# Patient Record
Sex: Female | Born: 1940 | Race: White | Hispanic: No | Marital: Married | Smoking: Never smoker
Health system: Southern US, Community
[De-identification: ages and names within clinical notes are randomized; demographics above are authoritative.]

## PROBLEM LIST (undated history)

## (undated) DIAGNOSIS — I2510 Atherosclerotic heart disease of native coronary artery without angina pectoris: Secondary | ICD-10-CM

## (undated) DIAGNOSIS — IMO0002 Reserved for concepts with insufficient information to code with codable children: Secondary | ICD-10-CM

## (undated) DIAGNOSIS — K648 Other hemorrhoids: Secondary | ICD-10-CM

## (undated) DIAGNOSIS — D649 Anemia, unspecified: Secondary | ICD-10-CM

## (undated) DIAGNOSIS — K219 Gastro-esophageal reflux disease without esophagitis: Secondary | ICD-10-CM

## (undated) DIAGNOSIS — K602 Anal fissure, unspecified: Secondary | ICD-10-CM

## (undated) DIAGNOSIS — Z8711 Personal history of peptic ulcer disease: Secondary | ICD-10-CM

## (undated) DIAGNOSIS — I4891 Unspecified atrial fibrillation: Secondary | ICD-10-CM

## (undated) DIAGNOSIS — G4730 Sleep apnea, unspecified: Secondary | ICD-10-CM

## (undated) DIAGNOSIS — E079 Disorder of thyroid, unspecified: Secondary | ICD-10-CM

## (undated) DIAGNOSIS — M549 Dorsalgia, unspecified: Secondary | ICD-10-CM

## (undated) DIAGNOSIS — F32A Depression, unspecified: Secondary | ICD-10-CM

## (undated) DIAGNOSIS — M359 Systemic involvement of connective tissue, unspecified: Secondary | ICD-10-CM

## (undated) DIAGNOSIS — K209 Esophagitis, unspecified without bleeding: Secondary | ICD-10-CM

## (undated) DIAGNOSIS — E039 Hypothyroidism, unspecified: Secondary | ICD-10-CM

## (undated) DIAGNOSIS — E785 Hyperlipidemia, unspecified: Secondary | ICD-10-CM

## (undated) DIAGNOSIS — H269 Unspecified cataract: Secondary | ICD-10-CM

## (undated) DIAGNOSIS — I499 Cardiac arrhythmia, unspecified: Secondary | ICD-10-CM

## (undated) DIAGNOSIS — K449 Diaphragmatic hernia without obstruction or gangrene: Secondary | ICD-10-CM

## (undated) DIAGNOSIS — I10 Essential (primary) hypertension: Secondary | ICD-10-CM

## (undated) DIAGNOSIS — K5790 Diverticulosis of intestine, part unspecified, without perforation or abscess without bleeding: Secondary | ICD-10-CM

## (undated) DIAGNOSIS — M8580 Other specified disorders of bone density and structure, unspecified site: Secondary | ICD-10-CM

## (undated) DIAGNOSIS — I214 Non-ST elevation (NSTEMI) myocardial infarction: Secondary | ICD-10-CM

## (undated) DIAGNOSIS — F329 Major depressive disorder, single episode, unspecified: Secondary | ICD-10-CM

## (undated) HISTORY — PX: CATARACT EXTRACTION: SUR2

## (undated) HISTORY — DX: Other specified disorders of bone density and structure, unspecified site: M85.80

## (undated) HISTORY — DX: Major depressive disorder, single episode, unspecified: F32.9

## (undated) HISTORY — DX: Atherosclerotic heart disease of native coronary artery without angina pectoris: I25.10

## (undated) HISTORY — PX: PATELLA FRACTURE SURGERY: SHX735

## (undated) HISTORY — PX: CORONARY ANGIOPLASTY WITH STENT PLACEMENT: SHX49

## (undated) HISTORY — DX: Essential (primary) hypertension: I10

## (undated) HISTORY — DX: Diaphragmatic hernia without obstruction or gangrene: K44.9

## (undated) HISTORY — PX: KNEE SURGERY: SHX244

## (undated) HISTORY — DX: Diverticulosis of intestine, part unspecified, without perforation or abscess without bleeding: K57.90

## (undated) HISTORY — DX: Depression, unspecified: F32.A

## (undated) HISTORY — DX: Anemia, unspecified: D64.9

## (undated) HISTORY — DX: Personal history of peptic ulcer disease: Z87.11

## (undated) HISTORY — DX: Unspecified atrial fibrillation: I48.91

## (undated) HISTORY — DX: Disorder of thyroid, unspecified: E07.9

## (undated) HISTORY — DX: Anal fissure, unspecified: K60.2

## (undated) HISTORY — DX: Hyperlipidemia, unspecified: E78.5

## (undated) HISTORY — DX: Unspecified cataract: H26.9

## (undated) HISTORY — DX: Dorsalgia, unspecified: M54.9

## (undated) HISTORY — PX: ABDOMINAL HYSTERECTOMY: SHX81

## (undated) HISTORY — PX: BREAST REDUCTION SURGERY: SHX8

## (undated) HISTORY — DX: Reserved for concepts with insufficient information to code with codable children: IMO0002

## (undated) HISTORY — DX: Sleep apnea, unspecified: G47.30

## (undated) HISTORY — PX: REDUCTION MAMMAPLASTY: SUR839

## (undated) HISTORY — DX: Gastro-esophageal reflux disease without esophagitis: K21.9

## (undated) HISTORY — DX: Esophagitis, unspecified without bleeding: K20.90

## (undated) HISTORY — PX: CHOLECYSTECTOMY: SHX55

## (undated) HISTORY — PX: APPENDECTOMY: SHX54

## (undated) HISTORY — DX: Esophagitis, unspecified: K20.9

## (undated) HISTORY — DX: Other hemorrhoids: K64.8

---

## 2000-09-21 ENCOUNTER — Inpatient Hospital Stay (HOSPITAL_COMMUNITY): Admission: EM | Admit: 2000-09-21 | Discharge: 2000-09-24 | Payer: Self-pay | Admitting: Cardiovascular Disease

## 2000-10-23 ENCOUNTER — Ambulatory Visit (HOSPITAL_COMMUNITY): Admission: RE | Admit: 2000-10-23 | Discharge: 2000-10-23 | Payer: Self-pay | Admitting: Internal Medicine

## 2000-10-23 ENCOUNTER — Encounter: Payer: Self-pay | Admitting: Internal Medicine

## 2000-11-02 ENCOUNTER — Inpatient Hospital Stay (HOSPITAL_COMMUNITY): Admission: EM | Admit: 2000-11-02 | Discharge: 2000-11-05 | Payer: Self-pay | Admitting: Cardiovascular Disease

## 2000-11-03 ENCOUNTER — Encounter: Payer: Self-pay | Admitting: Internal Medicine

## 2001-01-19 ENCOUNTER — Inpatient Hospital Stay (HOSPITAL_COMMUNITY): Admission: EM | Admit: 2001-01-19 | Discharge: 2001-01-21 | Payer: Self-pay | Admitting: Cardiology

## 2001-04-19 ENCOUNTER — Inpatient Hospital Stay (HOSPITAL_COMMUNITY): Admission: EM | Admit: 2001-04-19 | Discharge: 2001-04-22 | Payer: Self-pay

## 2001-04-20 ENCOUNTER — Encounter: Payer: Self-pay | Admitting: Internal Medicine

## 2002-08-06 ENCOUNTER — Encounter: Payer: Self-pay | Admitting: Unknown Physician Specialty

## 2002-08-06 ENCOUNTER — Ambulatory Visit (HOSPITAL_COMMUNITY): Admission: RE | Admit: 2002-08-06 | Discharge: 2002-08-06 | Payer: Self-pay | Admitting: Unknown Physician Specialty

## 2002-08-12 ENCOUNTER — Ambulatory Visit (HOSPITAL_BASED_OUTPATIENT_CLINIC_OR_DEPARTMENT_OTHER): Admission: RE | Admit: 2002-08-12 | Discharge: 2002-08-12 | Payer: Self-pay | Admitting: Unknown Physician Specialty

## 2002-08-17 ENCOUNTER — Encounter (HOSPITAL_COMMUNITY): Admission: RE | Admit: 2002-08-17 | Discharge: 2002-09-16 | Payer: Self-pay | Admitting: Unknown Physician Specialty

## 2002-08-17 ENCOUNTER — Encounter: Payer: Self-pay | Admitting: Unknown Physician Specialty

## 2002-08-21 ENCOUNTER — Emergency Department (HOSPITAL_COMMUNITY): Admission: EM | Admit: 2002-08-21 | Discharge: 2002-08-21 | Payer: Self-pay | Admitting: Emergency Medicine

## 2002-09-28 ENCOUNTER — Ambulatory Visit (HOSPITAL_COMMUNITY): Admission: RE | Admit: 2002-09-28 | Discharge: 2002-09-28 | Payer: Self-pay | Admitting: Internal Medicine

## 2002-10-28 ENCOUNTER — Observation Stay (HOSPITAL_COMMUNITY): Admission: RE | Admit: 2002-10-28 | Discharge: 2002-10-29 | Payer: Self-pay | Admitting: Internal Medicine

## 2002-11-28 ENCOUNTER — Inpatient Hospital Stay (HOSPITAL_COMMUNITY): Admission: AD | Admit: 2002-11-28 | Discharge: 2002-11-30 | Payer: Self-pay | Admitting: *Deleted

## 2003-02-16 ENCOUNTER — Ambulatory Visit (HOSPITAL_COMMUNITY): Admission: RE | Admit: 2003-02-16 | Discharge: 2003-02-16 | Payer: Self-pay | Admitting: Unknown Physician Specialty

## 2003-07-12 ENCOUNTER — Ambulatory Visit (HOSPITAL_COMMUNITY): Admission: RE | Admit: 2003-07-12 | Discharge: 2003-07-12 | Payer: Self-pay | Admitting: Orthopedic Surgery

## 2003-07-27 ENCOUNTER — Encounter: Admission: RE | Admit: 2003-07-27 | Discharge: 2003-07-27 | Payer: Self-pay | Admitting: Orthopedic Surgery

## 2003-07-29 ENCOUNTER — Ambulatory Visit (HOSPITAL_BASED_OUTPATIENT_CLINIC_OR_DEPARTMENT_OTHER): Admission: RE | Admit: 2003-07-29 | Discharge: 2003-07-29 | Payer: Self-pay | Admitting: Orthopedic Surgery

## 2003-07-29 ENCOUNTER — Ambulatory Visit: Admission: RE | Admit: 2003-07-29 | Discharge: 2003-07-29 | Payer: Self-pay | Admitting: Orthopedic Surgery

## 2004-04-14 ENCOUNTER — Ambulatory Visit: Payer: Self-pay | Admitting: Cardiology

## 2004-04-28 ENCOUNTER — Ambulatory Visit: Payer: Self-pay | Admitting: Cardiology

## 2004-05-02 ENCOUNTER — Ambulatory Visit: Payer: Self-pay | Admitting: Family Medicine

## 2004-05-26 ENCOUNTER — Ambulatory Visit: Payer: Self-pay | Admitting: Cardiology

## 2004-10-15 ENCOUNTER — Ambulatory Visit: Payer: Self-pay | Admitting: Cardiology

## 2004-10-15 ENCOUNTER — Inpatient Hospital Stay (HOSPITAL_COMMUNITY): Admission: AD | Admit: 2004-10-15 | Discharge: 2004-10-16 | Payer: Self-pay | Admitting: Cardiology

## 2004-11-29 ENCOUNTER — Ambulatory Visit: Payer: Self-pay | Admitting: Cardiology

## 2005-01-16 ENCOUNTER — Ambulatory Visit: Payer: Self-pay | Admitting: Cardiology

## 2005-08-20 ENCOUNTER — Ambulatory Visit: Payer: Self-pay | Admitting: Family Medicine

## 2005-08-23 ENCOUNTER — Inpatient Hospital Stay (HOSPITAL_COMMUNITY): Admission: AD | Admit: 2005-08-23 | Discharge: 2005-08-24 | Payer: Self-pay | Admitting: Cardiology

## 2005-08-23 ENCOUNTER — Ambulatory Visit: Payer: Self-pay | Admitting: Cardiology

## 2005-08-23 ENCOUNTER — Ambulatory Visit: Payer: Self-pay | Admitting: Internal Medicine

## 2005-08-28 ENCOUNTER — Ambulatory Visit: Payer: Self-pay | Admitting: Family Medicine

## 2005-09-20 ENCOUNTER — Ambulatory Visit: Payer: Self-pay | Admitting: Cardiology

## 2005-10-03 ENCOUNTER — Ambulatory Visit: Payer: Self-pay | Admitting: Family Medicine

## 2005-10-11 ENCOUNTER — Ambulatory Visit (HOSPITAL_COMMUNITY): Admission: RE | Admit: 2005-10-11 | Discharge: 2005-10-11 | Payer: Self-pay | Admitting: Family Medicine

## 2005-11-12 ENCOUNTER — Ambulatory Visit: Payer: Self-pay | Admitting: Family Medicine

## 2005-11-27 ENCOUNTER — Ambulatory Visit: Payer: Self-pay | Admitting: Family Medicine

## 2005-12-17 ENCOUNTER — Ambulatory Visit: Payer: Self-pay | Admitting: Family Medicine

## 2006-05-15 ENCOUNTER — Ambulatory Visit: Payer: Self-pay | Admitting: Family Medicine

## 2006-06-12 ENCOUNTER — Ambulatory Visit: Payer: Self-pay | Admitting: Cardiology

## 2006-10-02 ENCOUNTER — Ambulatory Visit: Payer: Self-pay | Admitting: Family Medicine

## 2006-10-16 ENCOUNTER — Ambulatory Visit (HOSPITAL_COMMUNITY): Admission: RE | Admit: 2006-10-16 | Discharge: 2006-10-16 | Payer: Self-pay | Admitting: Family Medicine

## 2006-10-17 ENCOUNTER — Ambulatory Visit (HOSPITAL_COMMUNITY): Admission: RE | Admit: 2006-10-17 | Discharge: 2006-10-17 | Payer: Self-pay | Admitting: Family Medicine

## 2007-05-29 ENCOUNTER — Ambulatory Visit: Payer: Self-pay | Admitting: Internal Medicine

## 2007-06-08 HISTORY — PX: COLONOSCOPY: SHX174

## 2007-06-08 HISTORY — PX: ESOPHAGOGASTRODUODENOSCOPY: SHX1529

## 2007-06-12 ENCOUNTER — Ambulatory Visit (HOSPITAL_COMMUNITY): Admission: RE | Admit: 2007-06-12 | Discharge: 2007-06-12 | Payer: Self-pay | Admitting: Internal Medicine

## 2007-06-12 ENCOUNTER — Encounter: Payer: Self-pay | Admitting: Internal Medicine

## 2007-06-12 ENCOUNTER — Ambulatory Visit: Payer: Self-pay | Admitting: Internal Medicine

## 2007-09-15 ENCOUNTER — Encounter: Admission: RE | Admit: 2007-09-15 | Discharge: 2007-11-03 | Payer: Self-pay | Admitting: Family Medicine

## 2008-01-10 ENCOUNTER — Ambulatory Visit: Payer: Self-pay | Admitting: Cardiology

## 2008-05-06 ENCOUNTER — Ambulatory Visit (HOSPITAL_COMMUNITY): Admission: RE | Admit: 2008-05-06 | Discharge: 2008-05-06 | Payer: Self-pay | Admitting: Family Medicine

## 2008-06-15 ENCOUNTER — Encounter: Payer: Self-pay | Admitting: Cardiology

## 2008-09-08 ENCOUNTER — Encounter: Payer: Self-pay | Admitting: Physician Assistant

## 2008-09-08 ENCOUNTER — Ambulatory Visit: Payer: Self-pay | Admitting: Cardiology

## 2008-09-10 ENCOUNTER — Encounter: Payer: Self-pay | Admitting: Cardiology

## 2008-09-10 ENCOUNTER — Ambulatory Visit: Payer: Self-pay | Admitting: Cardiology

## 2008-09-10 ENCOUNTER — Inpatient Hospital Stay (HOSPITAL_COMMUNITY): Admission: AD | Admit: 2008-09-10 | Discharge: 2008-09-11 | Payer: Self-pay | Admitting: Cardiology

## 2008-09-15 ENCOUNTER — Encounter: Payer: Self-pay | Admitting: Cardiology

## 2008-09-29 ENCOUNTER — Ambulatory Visit: Payer: Self-pay | Admitting: Cardiology

## 2008-11-22 ENCOUNTER — Encounter: Payer: Self-pay | Admitting: Cardiology

## 2008-12-02 ENCOUNTER — Encounter (INDEPENDENT_AMBULATORY_CARE_PROVIDER_SITE_OTHER): Payer: Self-pay | Admitting: *Deleted

## 2008-12-21 DIAGNOSIS — E1169 Type 2 diabetes mellitus with other specified complication: Secondary | ICD-10-CM | POA: Insufficient documentation

## 2008-12-21 DIAGNOSIS — I2510 Atherosclerotic heart disease of native coronary artery without angina pectoris: Secondary | ICD-10-CM | POA: Insufficient documentation

## 2008-12-21 DIAGNOSIS — I10 Essential (primary) hypertension: Secondary | ICD-10-CM | POA: Insufficient documentation

## 2008-12-21 DIAGNOSIS — I152 Hypertension secondary to endocrine disorders: Secondary | ICD-10-CM | POA: Insufficient documentation

## 2008-12-21 DIAGNOSIS — E785 Hyperlipidemia, unspecified: Secondary | ICD-10-CM

## 2008-12-21 DIAGNOSIS — E1159 Type 2 diabetes mellitus with other circulatory complications: Secondary | ICD-10-CM | POA: Insufficient documentation

## 2008-12-23 ENCOUNTER — Ambulatory Visit: Payer: Self-pay | Admitting: Cardiology

## 2008-12-23 DIAGNOSIS — M1990 Unspecified osteoarthritis, unspecified site: Secondary | ICD-10-CM | POA: Insufficient documentation

## 2009-04-05 ENCOUNTER — Encounter: Payer: Self-pay | Admitting: Cardiology

## 2009-04-29 ENCOUNTER — Ambulatory Visit: Payer: Self-pay | Admitting: Cardiology

## 2009-04-29 DIAGNOSIS — H8110 Benign paroxysmal vertigo, unspecified ear: Secondary | ICD-10-CM | POA: Insufficient documentation

## 2009-05-18 ENCOUNTER — Encounter: Payer: Self-pay | Admitting: Cardiology

## 2009-08-02 ENCOUNTER — Telehealth (INDEPENDENT_AMBULATORY_CARE_PROVIDER_SITE_OTHER): Payer: Self-pay | Admitting: *Deleted

## 2009-08-23 ENCOUNTER — Telehealth (INDEPENDENT_AMBULATORY_CARE_PROVIDER_SITE_OTHER): Payer: Self-pay | Admitting: *Deleted

## 2009-09-06 ENCOUNTER — Encounter: Payer: Self-pay | Admitting: Cardiology

## 2009-12-09 ENCOUNTER — Emergency Department (HOSPITAL_COMMUNITY): Admission: EM | Admit: 2009-12-09 | Discharge: 2009-12-09 | Payer: Self-pay | Admitting: Emergency Medicine

## 2010-01-05 ENCOUNTER — Ambulatory Visit: Payer: Self-pay | Admitting: Otolaryngology

## 2010-01-12 ENCOUNTER — Ambulatory Visit (HOSPITAL_COMMUNITY): Admission: RE | Admit: 2010-01-12 | Discharge: 2010-01-12 | Payer: Self-pay | Admitting: Family Medicine

## 2010-05-11 NOTE — Miscellaneous (Signed)
Summary: rx - plavix  Clinical Lists Changes  Medications: Added new medication of PLAVIX 75 MG TABS (CLOPIDOGREL BISULFATE) Take 1 tablet by mouth once a day - Signed Rx of PLAVIX 75 MG TABS (CLOPIDOGREL BISULFATE) Take 1 tablet by mouth once a day;  #90 x 3;  Signed;  Entered by: Hoover Brunette, LPN;  Authorized by: Lewayne Bunting, MD, [facility];  Method used: Electronically to [facility]*, [address] [address], [place], [place]  [id number], Ph: [phone], Fax: [phone]    Prescriptions: PLAVIX 75 MG TABS (CLOPIDOGREL BISULFATE) Take 1 tablet by mouth once a day  #90 x 3   Entered by:   Hoover Brunette, LPN   Authorized by:   Lewayne Bunting, MD, [facility]   Signed by:   Hoover Brunette, LPN on 62/95/2841   Method used:   Electronically to        [facility]* (retail)       [address] [address]       [place], [place]  [id number]       Ph: [phone]       Fax: [phone]   RxID:   [id number]

## 2010-05-11 NOTE — Letter (Signed)
Summary: Appointment- Rescheduled  [facility] [address]   [place], [place] [address]   Phone: [phone]  Fax: [phone]     Sep 06, 2009 MRN: [id number]     Rhonda Garcia [address] [place], [place]  [address]     Dear [name],   Due to a change in our office schedule, your appointment on   September 20, 2009 at 2 :45 pm must be changed.    Your new appointment will be September 21, 2009 at 1:15pm.   We look forward to participating in your health care needs.        Sincerely,  Glass blower/designer

## 2010-05-11 NOTE — Letter (Signed)
Summary: Engineer, materials at [facility]  [address]   [place], [place] [address]   Phone: [phone]  Fax: [phone]    December 02, 2008 MRN: [id number]   Rhonda Garcia [address] [place], [place]  [address]   Dear [name],  Your test ordered by Selena Batten has been reviewed by your physician (or physician assistant) and was found to be normal or stable. Your physician (or physician assistant) felt no changes were needed at this time.  ____ Echocardiogram  ____ Cardiac Stress Test  __X__ Lab Work (11/22/08-FROM DR. NYLAND'S OFFICE)  ____ Peripheral vascular study of arms, legs or neck  ____ CT scan or X-ray  ____ Lung or Breathing test  ____ Other:   Thank you.   Cyril Loosen, RN, BSN    Duane Boston, M.D., F.A.C.C. Thressa Sheller, M.D., F.A.C.C. Oneal Grout, M.D., F.A.C.C. Cheree Ditto, M.D., F.A.C.C. Daiva Nakayama, M.D., F.A.C.C. Kenney Houseman, M.D., F.A.C.C. Jeanne Ivan, PA-C

## 2010-05-11 NOTE — Assessment & Plan Note (Signed)
Summary: 6 MONTH FU -CK BP RECV REMINDER VS   Visit Type:  Follow-up Primary Provider:  Nyland  CC:  follow-up visit.  History of Present Illness: the patient is a 70 year old female is coronary artery disease status post prior stenting.  The patient reports no central chest pain shortness of breath orthopnea PND.  She does report back pain and some pain in the right elbow related to certain positions.  Her main complain is vertigo episodes that have occurred on December 28 and in January 14.  The patient state that there was severe nausea and a spinning sensation associated with this.  When she rapidly moves her head backwards she becomes extremely nauseated.  She has been given meclizine for what I presume is benign positional vertigo.  Her blood pressure is poorly controlled today.  She has been compliant with her medical regimen.  She denies any palpitations syncope.  Clinical Review Panels:  CXR CXR results  Stable cardiac size and mediastinal contours.  Stable         lung volumes.  No pneumothorax, pulmonary edema, pleural effusion,         consolidation, or confluent airspace opacity.                   IMPRESSION:         No acute cardiopulmonary abnormality. (09/08/2008)  Cardiac Imaging Cardiac Cath Findings  CONCLUSION:   1. Coronary artery disease status post prior PCI with 0% stenosis of       the stent site in the proximal LAD, 70% narrowing in the first       diagonal branch of the LAD, tandem 70% stenosis in the mid LAD, 40%       stenosis at the stent site in the ramus branch of circumflex       artery, less than 10% stenosis at the stent site in the proximal       right coronary artery and 95% stenosis in the mid-right coronary       artery with normal LV function.   2. Successful PCI of the mid-right coronary artery lesion using a Zion       drug-eluting stent with improvement in center narrowing from 95% to       0%.      DISPOSITION:  The patient will return  post angio for further   observation.      Bruce Elvera Lennox Juanda Chance, MD, [facility]  (09/10/2008)    Preventive Screening-Counseling & Management  Alcohol-Tobacco     Smoking Status: never  Current Medications (verified): 1)  Plavix 75 Mg Tabs (Clopidogrel Bisulfate) .... Take 1 Tablet By Mouth Once A Day 2)  Isosorbide Mononitrate Cr 30 Mg Xr24h-Tab (Isosorbide Mononitrate) .... Take One Tablet By Mouth Daily 3)  Simvastatin 20 Mg Tabs (Simvastatin) .... Take One Tablet By Mouth Daily At Bedtime 4)  Metformin Hcl 500 Mg Tabs (Metformin Hcl) .... Take 2 Tablets By Mouth Two Times A Day 5)  Glucotrol 5 Mg Tabs (Glipizide) .... Take 1 Tablet By Mouth Once A Day 6)  Omeprazole 20 Mg Cpdr (Omeprazole) .... Take 1 Capsule By Mouth Once A Day 7)  Metoprolol Succinate 50 Mg Xr24h-Tab (Metoprolol Succinate) .... Take One Tablet By Mouth Daily 8)  Aspirin Ec 325 Mg Tbec (Aspirin) .... Take One Tablet By Mouth Daily 9)  Tylenol Extra Strength 500 Mg Tabs (Acetaminophen) .... Take One - Two Tabs Every 6 - 8 Hours As Needed For Arthritis Pain  10)  Meclizine Hcl 25 Mg Tabs (Meclizine Hcl) .... Take 1 Tablet By Mouth Three Times A Day As Needed 11)  Hydrochlorothiazide 12.5 Mg Caps (Hydrochlorothiazide) .... Take 1 Tablet By Mouth Once A Day  Allergies (verified): 1)  Phenergan  Comments:  Nurse/Medical Assistant: The patient's medications and allergies were reviewed with the patient and were updated in the Medication and Allergy Lists. Bottles reviewed.  Past History:  Past Medical History: Last updated: 12/23/2008 HYPERTENSION, UNSPECIFIED (ICD-401.9) HYPERLIPIDEMIA-MIXED (ICD-272.4) CAD, NATIVE VESSEL (ICD-414.01) Anemia Inability to tolerate continuous positive airway pressure.  Non-insulin-dependent diabetes mellitus.  Hiatal hernia.  Gastroesophageal reflux disease.  1. Coronary artery disease.  See details above.  Status post XIENCE     stent to the mid right coronary artery. 2.  Hypertension. 3. Anemia. 4. Dyslipidemia. 5. 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 6. Non-insulin-dependent diabetes mellitus. 7. Hiatal hernia. 8. Gastroesophageal reflux disease.     Past Surgical History: Last updated: 12/21/2008 status post breast reduction Abdominal Hysterectomy-Total Cholecystectomy Knee Arthroscopy  Family History: Last updated: 12/21/2008 Family History of Cancer:  Family History of Coronary Artery Disease:  Family History of CVA or Stroke:  Family History of Diabetes:  Family History of Renal Disease:   Social History: Last updated: 12/21/2008 Retired  Married  Tobacco Use - Former.  Alcohol Use - no Drug Use - no  Risk Factors: Smoking Status: never (04/29/2009)  Review of Systems  The patient denies fatigue, malaise, fever, weight gain/loss, vision loss, decreased hearing, hoarseness, chest pain, palpitations, shortness of breath, prolonged cough, wheezing, sleep apnea, coughing up blood, abdominal pain, blood in stool, nausea, vomiting, diarrhea, heartburn, incontinence, blood in urine, muscle weakness, joint pain, leg swelling, rash, skin lesions, headache, fainting, dizziness, depression, anxiety, enlarged lymph nodes, easy bruising or bleeding, and environmental allergies.         vertigo and nausea  Vital Signs:  Patient profile:   70 year old female Height:      61 inches Weight:      198 pounds Pulse rate:   69 / minute BP sitting:   162 / 81  (left arm) Cuff size:   large  Vitals Entered By: Carlye Grippe (April 29, 2009 9:28 AM) CC: follow-up visit   Physical Exam  Additional Exam:  General: Well-developed, well-nourished in no distress head: Normocephalic and atraumatic eyes PERRLA/EOMI intact, conjunctiva and lids normal nose: No deformity or lesions mouth normal dentition, normal posterior pharynx neck: Supple, no JVD.  No masses, thyromegaly or abnormal cervical nodes.  No carotid  bruits lungs: Normal breath sounds bilaterally without wheezing.  Normal percussion heart: regular rate and rhythm with normal S1 and S2, no S3 or S4.  PMI is normal.  No pathological murmurs abdomen: Normal bowel sounds, abdomen is soft and nontender without masses, organomegaly or hernias noted.  No hepatosplenomegaly musculoskeletal: Back normal, normal gait muscle strength and tone normal pulsus: Pulse is normal in all 4 extremities Extremities: No peripheral pitting edema neurologic: Alert and oriented x 3 skin: Intact without lesions or rashes cervical nodes: No significant adenopathy psychologic: Normal affect    Impression & Recommendations:  Problem # 1:  POSITIONAL VERTIGO (ICD-386.11) respective base has benign positional vertigo.  Have given the patient a referral to Margretta Ditty.  She state meclizine only makes her sleepy and tired. Orders: Misc. Referral (Misc. Ref)  Problem # 2:  HYPERTENSION, UNSPECIFIED (ICD-401.9) blood pressure is poorly controlled and I will add amlodipine 5 mg p.o. daily to  her medical regimen.  This can be further up titrated as needed. The following medications were removed from the medication list:    Lisinopril-hydrochlorothiazide 20-25 Mg Tabs (Lisinopril-hydrochlorothiazide) .Marland Kitchen... Take 1 tablet by mouth once a day Her updated medication list for this problem includes:    Metoprolol Succinate 50 Mg Xr24h-tab (Metoprolol succinate) .Marland Kitchen... Take one tablet by mouth daily    Aspirin Ec 325 Mg Tbec (Aspirin) .Marland Kitchen... Take one tablet by mouth daily    Hydrochlorothiazide 12.5 Mg Caps (Hydrochlorothiazide) .Marland Kitchen... Take 1 tablet by mouth once a day    Amlodipine Besylate 5 Mg Tabs (Amlodipine besylate) .Marland Kitchen... Take one tablet by mouth daily  Problem # 3:  CAD, NATIVE VESSEL (ICD-414.01) the patient has had no recurrent chest pain after her last drug eluding stent placement.  She can continue her current medical regimen including aspirin and Plavix. The  following medications were removed from the medication list:    Lisinopril-hydrochlorothiazide 20-25 Mg Tabs (Lisinopril-hydrochlorothiazide) .Marland Kitchen... Take 1 tablet by mouth once a day Her updated medication list for this problem includes:    Plavix 75 Mg Tabs (Clopidogrel bisulfate) .Marland Kitchen... Take 1 tablet by mouth once a day    Isosorbide Mononitrate Cr 30 Mg Xr24h-tab (Isosorbide mononitrate) .Marland Kitchen... Take one tablet by mouth daily    Metoprolol Succinate 50 Mg Xr24h-tab (Metoprolol succinate) .Marland Kitchen... Take one tablet by mouth daily    Aspirin Ec 325 Mg Tbec (Aspirin) .Marland Kitchen... Take one tablet by mouth daily    Amlodipine Besylate 5 Mg Tabs (Amlodipine besylate) .Marland Kitchen... Take one tablet by mouth daily  Patient Instructions: 1)  Start Amlodipine 5mg  by mouth once daily. 2)  You have been referred to C. Weaver for vertigo. 3)  Your physician wants you to follow-up in: 1 year. You will receive a reminder letter in the mail one-two months in advance. If you don't receive a letter, please call our office to schedule the follow-up appointment.  Prescriptions: AMLODIPINE BESYLATE 5 MG TABS (AMLODIPINE BESYLATE) Take one tablet by mouth daily  #30 x 1   Entered by:   Cyril Loosen, RN, BSN   Authorized by:   Lewayne Bunting, MD, [facility]   Signed by:   Cyril Loosen, RN, BSN on 04/29/2009   Method used:   Electronically to        [facility]* (retail)       [address]       [place], [place]  [address]       Ph: [phone]       Fax: [phone]   RxID:   [id number]   Handout requested.

## 2010-05-11 NOTE — Letter (Signed)
Summary: External Correspondence/ FAXED [facility]  External Correspondence/ FAXED [facility]   Imported By: Dorise Hiss 05/25/2009 10:46:12  _____________________________________________________________________  External Attachment:    Type:   Image     Comment:   External Document

## 2010-05-11 NOTE — Assessment & Plan Note (Signed)
Summary: EC6 SOB   Visit Type:  Follow-up Primary Provider:  Nyland  CC:  follow-up visit.  History of Present Illness: the patient is a 70 year old female with a history of coronary artery disease status post prior intervention to the LAD, ramus intermedius and circumflex as well as RCA in 2002.  More recently this year she underwent a Xience drug-eluting stent placement to the mid right coronary artery.  She reports no recurrent chest pain.  She has no shortness of breath orthopnea PND.  Unfortunately, her blood person remains markedly elevated.  She is also still taking meloxicam which I told her is not beneficial for her blood pressure and could also increase her risk for future heart attack.  The patient state that she has never tried high dose Tylenol and I have recommended this.  She is single episode of substernal squeezingsensation of chest couple months ago this was very short lived and she reports no exertional chest pain.  The patient stated she feels much improved.  She reports no palpitations orthopnea PND.  Preventive Screening-Counseling & Management  Alcohol-Tobacco     Smoking Status: never  Current Problems (verified): 1)  Osteoarthritis  (ICD-715.90) 2)  Hypertension, Unspecified  (ICD-401.9) 3)  Hyperlipidemia-mixed  (ICD-272.4) 4)  Cad, Native Vessel  (ICD-414.01)  Current Medications (verified): 1)  Plavix 75 Mg Tabs (Clopidogrel Bisulfate) .... Take 1 Tablet By Mouth Once A Day 2)  Isosorbide Mononitrate Cr 30 Mg Xr24h-Tab (Isosorbide Mononitrate) .... Take One Tablet By Mouth Daily 3)  Simvastatin 20 Mg Tabs (Simvastatin) .... Take One Tablet By Mouth Daily At Bedtime 4)  Metformin Hcl 500 Mg Tabs (Metformin Hcl) .... Take 2 Tablets By Mouth Two Times A Day 5)  Glucotrol 5 Mg Tabs (Glipizide) .... Take 1 Tablet By Mouth Once A Day 6)  Omeprazole 20 Mg Cpdr (Omeprazole) .... Take 1 Capsule By Mouth Once A Day 7)  Lisinopril-Hydrochlorothiazide 20-25 Mg Tabs  (Lisinopril-Hydrochlorothiazide) .... Take 1 Tablet By Mouth Once A Day 8)  Metoprolol Succinate 50 Mg Xr24h-Tab (Metoprolol Succinate) .... Take One Tablet By Mouth Daily 9)  Aspirin Ec 325 Mg Tbec (Aspirin) .... Take One Tablet By Mouth Daily 10)  Tylenol Extra Strength 500 Mg Tabs (Acetaminophen) .... Take One - Two Tabs Every 6 - 8 Hours As Needed For Arthritis Pain  Allergies (verified): 1)  Phenergan  Past History:  Past Medical History: HYPERTENSION, UNSPECIFIED (ICD-401.9) HYPERLIPIDEMIA-MIXED (ICD-272.4) CAD, NATIVE VESSEL (ICD-414.01) Anemia Inability to tolerate continuous positive airway pressure.  Non-insulin-dependent diabetes mellitus.  Hiatal hernia.  Gastroesophageal reflux disease.  1. Coronary artery disease.  See details above.  Status post XIENCE     stent to the mid right coronary artery. 2. Hypertension. 3. Anemia. 4. Dyslipidemia. 5. 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 6. Non-insulin-dependent diabetes mellitus. 7. Hiatal hernia. 8. Gastroesophageal reflux disease.     Family History: Reviewed history from 12/21/2008 and no changes required. Family History of Cancer:  Family History of Coronary Artery Disease:  Family History of CVA or Stroke:  Family History of Diabetes:  Family History of Renal Disease:   Social History: Reviewed history from 12/21/2008 and no changes required. Retired  Married  Tobacco Use - Former.  Alcohol Use - no Drug Use - no Smoking Status:  never  Review of Systems  The patient denies anorexia, fever, weight loss, weight gain, vision loss, decreased hearing, hoarseness, chest pain, syncope, dyspnea on exertion, peripheral edema, prolonged cough, headaches, hemoptysis, abdominal pain, melena, hematochezia,  severe indigestion/heartburn, hematuria, incontinence, genital sores, muscle weakness, suspicious skin lesions, transient blindness, difficulty walking, depression, unusual weight change,  abnormal bleeding, enlarged lymph nodes, and angioedema.    Vital Signs:  Patient profile:   70 year old female Height:      61 inches Weight:      201.50 pounds BMI:     38.21 Pulse rate:   72 / minute BP sitting:   168 / 103  (left arm)  Vitals Entered By: Cyril Loosen, RN, BSN (December 23, 2008 9:52 AM)  Nutrition Counseling: Patient's BMI is greater than 25 and therefore counseled on weight management options. CC: follow-up visit Is Patient Diabetic? Yes  Comments States BP up b/c son had an accident yesterday (fell from ladder) and is in the hospital. Pt states she forgot she was suppose to increase isosorbide to 60mg  so she has still been taking 30mg . She does have a [facility] that [facility] 60[facility]  tablets and states it is about time for refills on this. Also, she didn't increase simvastatin 40mg  b/c Dr. Lysbeth Galas told her she didn't need to do this.    Physical Exam  General:  Well developed, well nourished, in no acute distress. Head:  normocephalic and atraumatic Eyes:  PERRLA/EOM intact; conjunctiva and lids normal. Ears:  TM's intact and clear with normal canals and hearing Mouth:  Teeth, gums and palate normal. Oral mucosa normal. Neck:  Neck supple, no JVD. No masses, thyromegaly or abnormal cervical nodes. Lungs:  Clear bilaterally to auscultation and percussion. Heart:  Non-displaced PMI, chest non-tender; regular rate and rhythm, S1, S2 without murmurs, rubs or gallops. Carotid upstroke normal, no bruit. Normal abdominal aortic size, no bruits. Femorals normal pulses, no bruits. Pedals normal pulses. No edema, no varicosities. Abdomen:  Bowel sounds positive; abdomen soft and non-tender without masses, organomegaly, or hernias noted. No hepatosplenomegaly. Msk:  Back normal, normal gait. Muscle strength and tone normal. Pulses:  pulses normal in all 4 extremities Extremities:  No clubbing or cyanosis. Neurologic:  Alert and oriented x 3. Skin:   Intact without lesions or rashes. Cervical Nodes:  no significant adenopathy Psych:  Normal affect.   Impression & Recommendations:  Problem # 1:  CAD, NATIVE VESSEL (ICD-414.01) no recurrent chest pain.  The patient's most recent procedure was a drug-eluting stent to midright coronary artery.  She remains asymptomatic.  She will continue indefinitely on aspirin and Plavix. Her updated medication list for this problem includes:    Plavix 75 Mg Tabs (Clopidogrel bisulfate) .Marland Kitchen... Take 1 tablet by mouth once a day    Isosorbide Mononitrate Cr 30 Mg Xr24h-tab (Isosorbide mononitrate) .Marland Kitchen... Take one tablet by mouth daily    Lisinopril-hydrochlorothiazide 20-25 Mg Tabs (Lisinopril-hydrochlorothiazide) .Marland Kitchen... Take 1 tablet by mouth once a day    Metoprolol Succinate 50 Mg Xr24h-tab (Metoprolol succinate) .Marland Kitchen... Take one tablet by mouth daily    Aspirin Ec 325 Mg Tbec (Aspirin) .Marland Kitchen... Take one tablet by mouth daily  Problem # 2:  HYPERTENSION, UNSPECIFIED (ICD-401.9) hypertensionpoorly controlled and this could be in part secondary to meloxicam due to salt and water retaining effect.  I asked the patient take Tylenol for osteoarthritis.  We will also add hydrochlorothiazide to her lisinopril and give her a new prescription for lisinopril 20 milligrams/hydrochlorothiazide 25 mg.  The patient will call for an RN visit next week to check her blood pressure. Her updated medication list for this problem includes:    Lisinopril-hydrochlorothiazide 20-25 Mg Tabs (Lisinopril-hydrochlorothiazide) .Marland KitchenMarland KitchenMarland KitchenMarland Kitchen  Take 1 tablet by mouth once a day    Metoprolol Succinate 50 Mg Xr24h-tab (Metoprolol succinate) .Marland Kitchen... Take one tablet by mouth daily    Aspirin Ec 325 Mg Tbec (Aspirin) .Marland Kitchen... Take one tablet by mouth daily  Problem # 3:  HYPERLIPIDEMIA-MIXED (ICD-272.4) Assessment: Comment Only  Her updated medication list for this problem includes:    Simvastatin 20 Mg Tabs (Simvastatin) .Marland Kitchen... Take one tablet by mouth daily  at bedtime  Problem # 4:  OSTEOARTHRITIS (ICD-715.90) Assessment: Comment Only  Patient Instructions: 1)  Discontinue Meloxicam.   2)  Change lisinopril to lisinopril hydrochlorothiazide 20/25 mg daily.  3)  RN visit next week for BP. 4)  Tylenol 500 mg -  1-2 tabs every 6 to 8 hours for arthritis. 5)  Your physician wants you to follow-up in:   6 months.  You will receive a reminder letter in the mail two months in advance. If you don't receive a letter, please call our office to schedule the follow-up appointment. Prescriptions: LISINOPRIL-HYDROCHLOROTHIAZIDE 20-25 MG TABS (LISINOPRIL-HYDROCHLOROTHIAZIDE) Take 1 tablet by mouth once a day  #30 x 6   Entered and Authorized by:   Lewayne Bunting, MD, [facility]   Signed by:   Lewayne Bunting, MD, [facility] on 01/09/2009   Method used:   Electronically to        [facility]* (retail)       [address]       [place], [place]  [address]       Ph: [phone]       Fax: [phone]   RxID:   [id number]

## 2010-05-11 NOTE — Progress Notes (Signed)
Summary: plavix samples  Phone Note Other Incoming   Summary of Call: Pt. came by office to pick up patient assistance forms.  Plavix 75mg  samples given #12.  Lot [id number]  exp.05/2010.    Initial call taken by: Hoover Brunette, LPN,  Aug 23, 2009 2:23 PM

## 2010-05-11 NOTE — Miscellaneous (Signed)
Summary: rx - imdur, simvastatin  Clinical Lists Changes  Medications: Added new medication of IMDUR 60 MG XR24H-TAB (ISOSORBIDE MONONITRATE) Take 1 tablet by mouth once a day  PLACE ON FILE.  PATIENT WILL CALL WHEN NEED REFILL. - Signed Added new medication of SIMVASTATIN 40 MG TABS (SIMVASTATIN) Take 1 tablet by mouth at bedtime  PLACE ON FILE.  PATIENT WILL CALL WHEN NEEDING REFILL. - Signed Rx of IMDUR 60 MG XR24H-TAB (ISOSORBIDE MONONITRATE) Take 1 tablet by mouth once a day  PLACE ON FILE.  PATIENT WILL CALL WHEN NEED REFILL.;  #90 x 3;  Signed;  Entered by: Hoover Brunette, LPN;  Authorized by: Lewayne Bunting, MD, [facility];  Method used: Electronically to [facility] [address] [place], [place], [place]  [address], Ph: [phone], Fax: [phone] Rx of SIMVASTATIN 40 MG TABS (SIMVASTATIN) Take 1 tablet by mouth at bedtime  PLACE ON FILE.  PATIENT WILL CALL WHEN NEEDING REFILL.;  #90 x 3;  Signed;  Entered by: Hoover Brunette, LPN;  Authorized by: Lewayne Bunting, MD, [facility];  Method used: Electronically to [facility]*, [address] [place], [place], [place]  [address], Ph: [phone], Fax: [phone]    Prescriptions: SIMVASTATIN 40 MG TABS (SIMVASTATIN) Take 1 tablet by mouth at bedtime  PLACE ON FILE.  PATIENT WILL CALL WHEN NEEDING REFILL.  #90 x 3   Entered by:   Hoover Brunette, LPN   Authorized by:   Lewayne Bunting, MD, [facility]   Signed by:   Hoover Brunette, LPN on 59/56/3875   Method used:   Electronically to        [facility]* (retail)       [address] [place], [place]  [address]       Ph: [phone]       Fax: [phone]   RxID:   [id number] IMDUR 60 MG XR24H-TAB (ISOSORBIDE MONONITRATE) Take 1 tablet by mouth once a day  PLACE ON FILE.  PATIENT WILL CALL WHEN NEED REFILL.  #90 x 3   Entered by:   Hoover Brunette, LPN   Authorized by:   Lewayne Bunting, MD, [facility]   Signed by:   Hoover Brunette, LPN on 23/76/2831   Method used:   Electronically to        [facility]* (retail)       [address] [place], [place]   [address]       Ph: [phone]       Fax: [phone]   RxID:   [id number]

## 2010-05-11 NOTE — Progress Notes (Signed)
Summary: plavix question      Phone Note Call from Patient   Summary of Call: Need to know if need to continue med.(Amlodipine)  Has not been on in one month.  Left message to return call.  Hoover Brunette, LPN  August 02, 2009 4:02 PM   Follow-up for Phone Call        This week has been last week she has taken her Plavix.  States she can not afford 200.00 co-pay for 3 month suppy.  Pt. wants to know how much longer she needs to be on Plavix.  Does take Aspirin 81mg  daily.  Was not sure about continuing her Amlodipine.  Advised her that she does need to continue this med indefininely as this med was just started at last OV.   She is due for f/u in January2011. Follow-up by: Hoover Brunette, LPN,  August 04, 2009 2:22 PM  Additional Follow-up for Phone Call Additional follow up Details #1::        OK to  stop Plavix in June of 2012. if blood pressures also controlled, then amlodipine can be held also if not they need to continue amlodipine. Sounds like she needs followup to discuss medications. I am sure it is mistake when you  stated: followup in January of 2011????? Additional Follow-up by: Lewayne Bunting, MD, [facility],  Aug 08, 2009 1:05 PM    Additional Follow-up for Phone Call Additional follow up Details #2::    Left message to return call.  Hoover Brunette, LPN  Aug 11, 6043 3:05 PM   Left message to return call.  Hoover Brunette, LPN  Aug 10, 4096 6:09 PM  Discussed above with pt.  OV scheduled for 6/14 at 2:45.  States she can not afford Plavix co-pay (200.00 for 90 day supply)  and has been out x 1 week already.  Pt. to come by office to pick up patient assistance forms for Plavix & [place].     Follow-up by: Hoover Brunette, LPN,  Aug 10, 1189 6:19 PM

## 2010-07-17 LAB — GLUCOSE, CAPILLARY
Glucose-Capillary: 112 mg/dL — ABNORMAL HIGH (ref 70–99)
Glucose-Capillary: 134 mg/dL — ABNORMAL HIGH (ref 70–99)
Glucose-Capillary: 82 mg/dL (ref 70–99)
Glucose-Capillary: 90 mg/dL (ref 70–99)

## 2010-07-17 LAB — BASIC METABOLIC PANEL
BUN: 11 mg/dL (ref 6–23)
CO2: 28 mEq/L (ref 19–32)
Calcium: 8.8 mg/dL (ref 8.4–10.5)
Chloride: 107 mEq/L (ref 96–112)
Creatinine, Ser: 0.72 mg/dL (ref 0.4–1.2)
GFR calc Af Amer: >60 mL/min (ref >60)
GFR calc non Af Amer: >60 mL/min (ref >60)
Glucose, Bld: 132 mg/dL — ABNORMAL HIGH (ref 70–99)
Potassium: 4 mEq/L (ref 3.5–5.1)
Sodium: 140 mEq/L (ref 135–145)

## 2010-07-17 LAB — CBC
HCT: 38.3 % (ref 36.0–46.0)
Hemoglobin: 13.1 g/dL (ref 12.0–15.0)
MCHC: 34.1 g/dL (ref 30.0–36.0)
MCV: 91.1 fL (ref 78.0–100.0)
Platelets: 160 10*3/uL (ref 150–400)
RBC: 4.21 MIL/uL (ref 3.87–5.11)
RDW: 14.1 % (ref 11.5–15.5)
WBC: 5.6 10*3/uL (ref 4.0–10.5)

## 2010-08-22 NOTE — Op Note (Signed)
Rhonda Garcia, Rhonda Garcia               ACCOUNT NO.:  [id number]   MEDICAL RECORD NO.:  [id number]          PATIENT TYPE:  AMB   LOCATION:  DAY                           FACILITY:  [facility]   PHYSICIAN:  R. Roetta Sessions, M.D. DATE OF BIRTH:  [birth year]   DATE OF PROCEDURE:  DATE OF DISCHARGE:                               OPERATIVE REPORT   PROCEDURE:  Esophagogastroduodenoscopy, diagnostic, followed by  ileocolonoscopy with segmental biopsy and stool sampling.   INDICATIONS FOR PROCEDURE:  A 70 year old lady with diabetes presents  with a 59-month history of diarrhea, intermittent blood per rectum,also  has refractory heartburn, although heartburn symptoms have been somewhat  improved a recent course of omeprazole.  No  odynophagia or dysphagia  and no melena.  An EGD and colonoscopy are now being done.  This  approach has discussed the patient at length.  Potential risks, benefits  and alternatives have been reviewed and questions answered.  She is  agreeable.   There is no history of colorectal cancer.  Last colonoscopy was in 2004.  She was found have internal hemorrhoids and diverticulosis.   PROCEDURE NOTE:  Oxygen saturation, blood pressure, pulse, and  respirations are monitored throughout the entirety of both procedures.  Conscious sedation of both procedures was Versed 6 mg IV, Demerol 125 mg  IV in divided doses.  Cetacaine spray for topical pharyngeal anesthesia.   FINDINGS:  EGD examination:  Tubular esophagus revealed normal-appearing  esophageal mucosa.  The EG junction easily traversed.  Stomach:  Gastric  cavity was emptied, insufflated well with air.  Thorough examination of  the gastric mucosa including retroflexion of proximal stomach,  esophagogastric junction demonstrated only a small hiatal hernia.  Pylorus patent, easily traversed.  Examination of the bulb and second  and third portion revealed entirely normal-appearing mucosa.   THERAPEUTIC/DIAGNOSTIC MANEUVERS  PERFORMED:  None.   The patient tolerated procedure well.   Colonoscopy:  Digital rectal exam revealed no abnormalities.  Prep was  good.  Colon:  Colonic mucosa was surveyed from the rectosigmoid  junction through the left, transverse, right colon to this appendiceal  orifice, ileocecal valve, and cecum.  These structures well seen and  photographed for record.  Terminal ileum was elevated 10 cm.  From this  level scope was withdrawn.  Previous mucosal surfaces were again seen.  The patient had scattered pancolonic diverticula.  Otherwise the colonic  mucosa appeared normal.  Terminal ileum mucosa appeared normal.  Segmental biopsies of the ascending and sigmoid segments were taken for  histologic study. Also stool residue was suctioned out for microbiology  studies.  Scope was pulled down into the rectum.  Through examination of  the rectal mucosa including retroflex view of the anal verge  demonstrated only a couple of anal papillae and somewhat friable anal  canal.  The rectal  mucosa was biopsied for histologic study as well.   The patient tolerated both procedures, was reactive after endoscopy.   IMPRESSION:  EGD:  1. Normal esophagus.  2. Small hiatal hernia. Otherwise normal stomach, D1, D2, D3.   Colonoscopy findings:  1. Friable  anal canal and anal papillae.  Otherwise normal rectum.  2. Pancolonic diverticula.  The remainder of the colon mucosa appeared      normal,  normal terminal ileum,  status post segmental biopsy and      stool sampling.   RECOMMENDATIONS:  1. Continued Imodium p.r.n. diarrhea.  2. Follow-up on pending studies.  3. Stop omeprazole.  Begin Kapidex 60 mg capsule, 1 capsule each      morning before breakfast.  Prescription given.  She is to go by my      office for free samples for 2 weeks and she is going to let us know      how she is doing in 2 weeks and we will go from there.  4.  Further      recommendations to follow.      Jonathon Bellows, M.D.  Electronically Signed     RMR/MEDQ  D:  06/12/2007  T:  06/12/2007  Job:  [id number]   cc:   Delaney Meigs, M.D.  Fax: [phone]

## 2010-08-22 NOTE — Assessment & Plan Note (Signed)
[facility]                          [facility] NOTE   Rhonda Garcia, Rhonda Garcia                      MRN:          [id number]  DATE:09/29/2008                            DOB:          Sep 14, 1940    HISTORY OF PRESENT ILLNESS:  The patient is a 70 year old female with  documented coronary artery disease with previous stent to the LAD, ramus  branch, circumflex artery, and right coronary artery in 2002 with bare-  metal stents.  She recently presented with chest pain and felt to have  unstable angina.  She was transferred to [facility].  The  patient was found to have 0% stenosis of the stent site of the proximal  LAD, 70% narrowing of the first diagonal branch of the LAD, tandem 70%  stenosis in the mid LAD, 40% stenosis of the stent site in the ramus  branch of the circumflex artery, less than 10% stenosis at the stent  site in the proximal right coronary, and 95% in the mid right coronary  artery with normal LV function.  The patient underwent successful PCI of  the mid right coronary artery using a XIENCE drug-eluting stent.  The  patient states she is feeling much better.  Her energy level has picked  up quite a bit.  The patient denies any chest pain, shortness of breath,  orthopnea or PND, palpitations, or syncope.  The patient states that her  groin site has healed well also.  There have been no complications.   MEDICATIONS:  1. Simvastatin 20 mg p.o. daily.  2. Lisinopril 20 mg p.o. daily.  3. Glipizide 5 mg p.o. daily.  4. Omeprazole 20 mg p.o. daily.  5. Plavix 75 mg p.o. daily.  6. Metoprolol tartrate 50 mg p.o. daily.  7. Isosorbide 30 mg p.o. daily.  8. Meloxicam 7.5 mg p.o. b.i.d.  9. Metformin 500 mg p.o. b.i.d.  10.Aspirin 325 mg p.o. daily.   PHYSICAL EXAMINATION:  VITAL SIGNS:  Blood pressure 160/89, heart rate  69, weight 204 pounds.  GENERAL:  Well-nourished white female in no apparent test.  HEENT:  Pupils  isochoric.  Conjunctivae clear.  NECK:  Supple.  Normal carotid upstroke.  No carotid bruits.  LUNGS:  Clear breath sounds bilaterally.  HEART:  Regular rate and rhythm.  Normal S1 and S3.  No murmur, rubs, or  gallops.  ABDOMEN:  Soft, nontender.  No rebound or guarding.  Good bowel sounds.  EXTREMITIES:  No cyanosis, clubbing, or edema.  NEUROLOGIC:  The patient is alert, oriented, and grossly nonfocal.   PROBLEM LIST:  1. Coronary artery disease.  See details above.  Status post XIENCE      stent to the mid right coronary artery.  2. Hypertension.  3. Anemia.  4. Dyslipidemia.  5. 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  6. Non-insulin-dependent diabetes mellitus.  7. Hiatal hernia.  8. Gastroesophageal reflux disease.   PLAN:  1. The patient's lipid panel shows that her LDL is not at goal at 118      when she was hospitalized.  We will increase  her simvastatin to 40      mg p.o. at bedtime.  2. The patient denies any further angina, but still has significant      disease in the LAD.  We will increase her Imdur to 60 mg p.o.      daily.  3. The patient will follow up with me in 3 months because she will      have difficulty affording Plavix beyond this timeframe.  We will      have to explore further options at that time.     Learta Codding, MD,FACC  Electronically Signed    GED/MedQ  DD: 09/29/2008  DT: 09/30/2008  Job #: [id number]   cc:   Delaney Meigs, M.D.

## 2010-08-22 NOTE — Cardiovascular Report (Signed)
NAMEYSABELA, Rhonda Garcia               ACCOUNT NO.:  [id number]   MEDICAL RECORD NO.:  [id number]          PATIENT TYPE:  INP   LOCATION:  2507                         FACILITY:  [facility]   PHYSICIAN:  Everardo Beals. Juanda Chance, MD, FACCDATE OF BIRTH:  May 13, 1940   DATE OF PROCEDURE:  09/10/2008  DATE OF DISCHARGE:                            CARDIAC CATHETERIZATION   CLINICAL HISTORY:  [name] is 70 years old and has had documented  coronary artery disease with previous stents to the LAD, ramus branch to  the circumflex artery, and right coronary artery in 2002 with bare-metal  stent.  She recently presented with chest pain, thought to represent  unstable angina, and was transferred from [facility] to [facility] for  evaluation with angiography.   PROCEDURE:  The procedure was performed by the right femoral artery  arterial sheath and 5-French pyriform coronary catheters.  A front wall  arterial puncture was performed, and Omnipaque contrast was used.  At  the completion of diagnostic study, we made a decision to proceed with  intervention on the lesion in the mid-right coronary artery.   The patient was given 4 chewable aspirin.  She was given 600 mg of  Plavix.  She was given bivalve routine bolus and infusion.  We used a JR-  4 6-French guiding catheter with side holes.  We passed a [place] wire  across the lesion and down to the distal right coronary artery without  difficulty.  We predilated with 2.25 x 12-mm apex balloon performing 2  inflations up to 10 atmospheres for 30 seconds.  We then deployed a 2.75  x 18 mm IM stent in the mid-right coronary, deploying this with 1  inflation of 14 atmospheres for 30 seconds.  We then postdilated with a  3.0 x 15-mm [place] apex balloon performing 2 inflations up to 16 atmospheres  for 30 seconds. Final diagnostic studies were then performed through the  guiding catheter.  The patient tolerated the procedure well and left  laboratory in satisfactory  condition.   RESULTS:  The left main coronary artery:  The left main coronary artery  is free of significant disease.   The left anterior descending artery:  The left anterior descending  artery gave rise to a septal perforator diagonal branch and several  small septal perforators and diagonal branches.  There was 0% stenosis  at the stent site in the proximal LAD.  There was 70% stenosis in the  proximal portion of the first diagonal branch.  There were tandem 70%  stenoses in the mid LAD.   The circumflex artery:  The circumflex artery gave rise to a ramus  branch, marginal branch, and 2 posterolateral branches.  There was 40%  narrowing within the stent in the proximal portion of the ramus branch.  Circumflex artery was irregular and there was 30% mid stenosis.   The right coronary artery:  The right coronary artery was a moderate-  sized vessel that gave rise to a conus branch, a right ventricular  branch, a posterior descending branch, and 2 posterolateral branches.  There was less than 10% narrowing  at the stent site in the proximal  right coronary artery.  There was a 95% focal stenosis in the mid-right  coronary artery with some segmental disease extending over about 14-15  mm.  The distal vessel had a long area of 50% narrowing before the  posterior descending branch.   The left ventriculogram:  The left ventriculogram performed in the RAO  projection showed good wall motion with no areas of hypokinesis.  The  estimated ejection fraction was 65%.   Following stenting of the lesion in the mid-right coronary artery,  stenosis improved from 95% to 0%.   CONCLUSION:  1. Coronary artery disease status post prior PCI with 0% stenosis of      the stent site in the proximal LAD, 70% narrowing in the first      diagonal branch of the LAD, tandem 70% stenosis in the mid LAD, 40%      stenosis at the stent site in the ramus branch of circumflex      artery, less than 10% stenosis  at the stent site in the proximal      right coronary artery and 95% stenosis in the mid-right coronary      artery with normal LV function.  2. Successful PCI of the mid-right coronary artery lesion using a [place]      drug-eluting stent with improvement in center narrowing from 95% to      0%.   DISPOSITION:  The patient will return post angio for further  observation.      Bruce Elvera Lennox Juanda Chance, MD, [facility]  Electronically Signed     BRB/MEDQ  D:  09/10/2008  T:  09/11/2008  Job:  [id number]   cc:   Learta Codding, MD,FACC  Delaney Meigs, M.D.

## 2010-08-22 NOTE — Consult Note (Signed)
[name], [name]               ACCOUNT NO.:  [id number]   MEDICAL RECORD NO.:  [id number]           PATIENT TYPE:  AMB   LOCATION:  DAY                           FACILITY:  [facility]   PHYSICIAN:  R. Roetta Sessions, M.D. DATE OF BIRTH:  Aug 19, 1940   DATE OF CONSULTATION:  DATE OF DISCHARGE:                                 CONSULTATION   CHIEF COMPLAINT:  Chronic diarrhea and nausea.   HISTORY OF PRESENT ILLNESS:  [name] is a 70 year old female patient  who has been seen previously by Dr. Jena Gauss, but it has been several  years.  She presents with a 3 month history of change in bowel habits.  She went from having a normal soft brown bowel movement every day to  having significant diarrhea.  She is noting 2-3 very loose bowel  movements daily.  She has also had some hematochezia with wiping on the  toilet paper.  She complains of significant urgency, gas, and bloating.  She also has severe proctalgia.  She has daily nausea and anorexia and  reports that she has lost 10 pounds in the last 3 months  unintentionally.  She has tried some Imodium which does seem to help as  far as the diarrhea is concerned.  She has also tried Gas X with minimal  relief.  She has heartburn and indigestion at least 3 days a week.  She  has been on Prevacid previously and she stopped it due to cost.  She  came across a couple of samples and has restarted this; however, she has  not noted much relief.  She denies any fever or chills.  Denies any  recent antibiotic use or travel.  She denies any new medications.  She  is a diabetic and is unsure whether her blood sugars have been in good  control, but she does have a hemoglobin A1c of 6.2.  She has had an  occasional episode of incontinence as well.   PAST MEDICAL AND SURGICAL HISTORY:  Diabetes mellitus, hypertension,  hypercholesterolemia, arthritis, coronary disease, status post PTCA x3.  She is status post breast reduction, complete hysterectomy, left  knee  surgery, cholecystectomy for cholecystitis by Dr. Leona Carry  approximately 2 years ago.  She has a history of a colonoscopy and EGD  by Dr. Jena Gauss on September 28, 2002.  She was found to have internal  hemorrhoids, pan colonic diverticula, and a couple of bulbar erosions on  the EGD,  chronic GERD, arthritis.   CURRENT MEDICATIONS:  1. Metoprolol 50 mg daily.  2. Isosorbide 30 mg daily.  3. Simvastatin 20 mg daily.  4. Metformin 500 mg b.i.d.  5. Glipizide, unknown dose once daily.  6. Aspirin 81 mg daily.  7. Nitroglycerin p.r.n.  8. Extra Strength Tylenol p.r.n.   ALLERGIES:  PENICILLIN AND PHENERGAN.   FAMILY HISTORY:  There is no known family history of carcinoma or  chronic GI problems.  Mother IS deceased at 42 secondary to A CVA.  Father deceased at 63 secondary to a motor vehicle accident.  She has 1  son with severe CAD  and 1 son with renal problems.  She has multiple  siblings with a history significant for diabetes mellitus, cancer of  unknown etiology, and coronary artery disease.   SOCIAL HISTORY:  [name] is married.  She has 3 children.  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  She has a remote history of tobacco  use.  Denies any alcohol or drug use.   REVIEW OF SYSTEMS:  See history of present illness, otherwise negative.   PHYSICAL EXAMINATION:  VITAL SIGNS:  Weight of 196 pounds, height 63  inches, temperature 98 degrees, blood pressure 170/96, and pulse 72.  GENERAL:  She is a well-developed, well-nourished, overweight Caucasian  female who is alert, oriented, pleasant, and cooperative.  HEENT.  Eyes nonicteric.  Conjunctivae pink.  Oropharynx pink and moist  without lesions.  NECK:  Supple without thyromegaly.  CHEST:  Heart regular rate and rhythm.  Normal S1 and S2.  No murmurs,  clicks, rubs, or gallops.  LUNGS:  Clear to auscultation bilaterally.  ABDOMEN:  Positive bowel sounds x4.  No bruits auscultated.  Soft and  nontender without  palpable mass or hepatosplenomegaly.  No rebound or  guarding.  Examination is limited given the patient's body habitus.  EXTREMITIES:  Without clubbing or edema bilaterally.  SKIN:  Pink, warm, and dry without any rash or jaundice.   LABORATORY STUDIES:  From Dr. Joyce Copa office on May 13, 2007, she  had a normal CMP and normal CBC. It looks like H. pylori was checked;  however, I do not have this result.   IMPRESSION:  [name] is a 70 year old female with a 3 month history  of change in bowel habits.  She is having 2-3 loose stools a day with  some intermittent hematochezia.  She also complains of significant  urgency, gas, and bloating.  Other symptoms include anorexia, nausea,  daily heartburn, and indigestion with a history of chronic  gastroesophageal reflux disease.  The etiology of her diarrhea could  include microscopic colitis, colorectal carcinoma, I doubt we are  dealing with inflammatory bowel disease, but it should remain in the  differential, and diabetic diarrhea should remain in the differential as  well as well as side effects secondary to metformin.  She has had a 10  pound weight loss in the past 3 months which is concerning.  I feel she  needs to get back on a proton pump inhibitor for gastroesophageal reflux  disease as well and if she is not responsive to proton pump inhibitor  she is going to need further evaluation with an EGD to rule out peptic  ulcer disease.   PLAN:  1. She can take Imodium 2 mg each morning as needed prior to the      colonoscopy.  She should stop this the day before the colonoscopy      and possibly EGD if the symptoms are not resolved with a proton      pump inhibitor in the near future.  I discussed both procedures      including the risks and benefits which include bleeding, infection,      perforation, and drug reaction.  She agrees with plan and consent      will be obtained.  2. Begin omeprazole 20 mg daily, #31, with one  refill.  3. She is going to call in 1 week on proton pump inhibitor and she      will have an EGD if no response.  She may need a random biopsies;      therefore, will stop her aspirin 3 days prior to the procedures.   Thank you, Dr. Lysbeth Galas, for allowing [place] to participate in the care of Ms.  [place].      Lorenza Burton, N.P.      Jonathon Bellows, M.D.  Electronically Signed    KJ/MEDQ  D:  05/29/2007  T:  05/30/2007  Job:  [id number]   cc:   Delaney Meigs, M.D.  Fax: [phone]

## 2010-08-22 NOTE — Discharge Summary (Signed)
[name], [name] NO.:  [id number]   MEDICAL RECORD NO.:  [id number]          PATIENT TYPE:  INP   LOCATION:  2507                         FACILITY:  [facility]   PHYSICIAN:  Jesse Sans. Wall, MD, FACCDATE OF BIRTH:  [birth year]   DATE OF ADMISSION:  09/10/2008  DATE OF DISCHARGE:  09/11/2008                               DISCHARGE SUMMARY   PRIMARY CARDIOLOGIST:  Learta Codding, MD,FACC in [place].   PRIMARY CARE PHYSICIAN:  Delaney Meigs, MD   PROCEDURES PERFORMED DURING HOSPITALIZATION:  1. Cardiac catheterization completed by Dr. Charlies Constable on September 10, 2008.      a.     Percutaneous coronary intervention to the right coronary       artery secondary to 95% stenosis using a Xience drug-eluting stent       2.75 x 18 mm with good result.   FINAL DISCHARGE DIAGNOSES:  1. Coronary artery disease.      a.     Status post multiple prior percutaneous transluminal       coronary angioplasty notably a nondrug-eluting stent to the ramus       intermedius and percutaneous coronary intervention to first       diagonal and left anterior descending in 2005.      b.     Nonobstructive coronary artery disease by catheterization on       Aug 28, 2005.      c.     Nonischemic adenosine Cardiolite stress test, small fixed       anterior defect with ejection fraction 64% in March 2008.  Status       post cardiac catheterization September 10, 2008 by Dr. Charlies Constable       revealing 0%  stent proximal left anterior descending, 70%       diagonal disease, 70% in the mid, 40% mid distal.  Ramus 40% in-       stent stenosis.  Right coronary artery less than 10% proximal       stent stenosis, 95% mid and 50% distal.  LV function EF of 65%.      d.     PCI to the right coronary artery mid using a Xience drug-       eluting stent 2.75 x 18 mm reducing it from 95% to 0%.  2. Chronic hypertension.  3. Dyslipidemia.  4. History of obstructive sleep apnea, unable to tolerate CPAP.  5.  Non-insulin-dependant diabetes.  6. Hiatal hernia.  7. Gastroesophageal reflux disease.   HOSPITAL COURSE:  This is a 70 year old Caucasian female with the above-  mentioned diagnoses who was seen initially by Dr. Andee Lineman at [facility] secondary to chest discomfort.  The patient had taken 2  sublingual nitroglycerin and prior to coming to the emergency room  secondary to discomfort.  The patient has been having episodes of chest  discomfort and shortness of breath on and off for 2 weeks prior to being  admitted.  The patient is a difficult historian and also had talked  about feeling some  indigestion, reflux symptoms as well.  The patient  has not been keeping regular appointment with [facility] as  instructed and was last seen in 2008.   The patient was offered a stress monitor during hospitalization and she  refused stating that she did not want to have anymore stress test.  She  states that she wanted to have a cardiac catheterization and Dr. Andee Lineman  saw her on consult at that time.  After his evaluation, the patient was  found to be a candidate for cardiac catheterization and transferred to  [facility] on September 10, 2008.  The patient did undergo cardiac  catheterization as prescribed above by Dr. Juanda Chance on September 10, 2008.  Please see Dr. Regino Schultze cardiac catheterization note for more details.   The patient tolerated the procedure well without evidence of bleeding,  hematoma, or signs of infection and had no recurrence of chest  discomfort.  The patient was placed on Plavix and on proton pump  inhibitor and was feeling very well on following morning, smiling and  was anxious to return home.   VITAL SIGNS ON DISCHARGE:  Blood pressure 120/64, heart rate 73,  respirations 18, and O2 sat 94% on room air.  The patient's right groin  was inspected with no evidence of bleeding or hematoma.  Good distal  pulses were palpated.   DISCHARGE LABS:  Sodium 140, potassium  4.0, chloride 107, CO2 28, BUN  11, creatinine 0.72, and glucose 132.  Hemoglobin 13.1, hematocrit 38.3,  white blood cells 5.6, and platelets 160.  EKG revealing normal sinus  rhythm with ventricular rate of 63 beats per minute.   DISCHARGE MEDICATIONS:  1. Aspirin 325 daily.  2. Isosorbide mono 30 mg daily.  3. Metoprolol 50 mg daily.  4. Simvastatin 40 mg at bedtime.  5. Glipizide 5 mg daily.  6. Lisinopril 20 mg daily.  7. Pepcid over-the-counter b.i.d.  8. Ativan 0.5 mg 1 tablet daily.  9. Plavix 75 mg daily (new prescription provided).  10.Metformin 500 mg b.i.d. (the patient will start this on September 12 2008).  11.Meloxicam 7.5 mg twice a day.  12.Nitroglycerin 0.4 mg sublingual p.r.n. chest discomfort.   ALLERGIES:  PHENERGAN and PENICILLIN.   FOLLOWUP PLANS AND APPOINTMENT:  1. The patient will follow up with Dr. Andee Lineman in [place] and office will      call to make an appointment as this is the weekend.  2. The patient is to follow up with Dr. Lysbeth Galas.  The patient is to      make an appointment __________ to have continued medical      management.  3. The patient has been given postcardiac catheterization instructions      with particular emphasis on the right groin site for evidence of      bleeding, hematoma, or signs of infection.  4. The patient has been advised that our office will call her.  5. The patient has been advised need of prescription for Plavix and      verbalizes understanding.   Time spent with the patient to include physician time was 35 minutes.      Bettey Mare. Lyman Bishop, NP      Jesse Sans. Daleen Squibb, MD, [facility]  Electronically Signed    KML/MEDQ  D:  09/11/2008  T:  09/12/2008  Job:  [id number]   cc:   Delaney Meigs, M.D.

## 2010-08-25 NOTE — H&P (Signed)
[facility]  Patient:    Rhonda, Garcia Visit Number: [id number] MRN: [id number]          Service Type: MED Location: [phone] Attending Physician:  Nathen May Dictated by:   Nathen May, M.D., [facility] Admit Date:  04/19/2001   CC:         Fara Chute, M.D.  [facility], [place], [place].   History and Physical  HISTORY OF PRESENT ILLNESS:  [name] is 69 year old Caucasian female with a history of coronary artery disease involving three vessels initially done in June 2002 and repeated in July 2002 and most recently October 2002, having received a stent to the RCA in June and then when she presented in July with recurrent chest pain, underwent stenting of her 70% proximal LAD lesion.  She had recurrent chest pain in October and underwent repeat catheterization demonstrating some degree of in-stent restenosis for which she underwent angioplasty as well as angioplasty of an ostial D-1 lesion as well as stenting of a ramus lesion.  Her LV function was normal.  She now presents with a one-week history of a brackish taste in her mouth associated with three days of recurrent chest pain similar to but not exactly like her previous discomfort.  It should be noted that she says this has been present 20 out of the last 72 hours so quite long and persistent, not particularly associated with a change in position, not particularly related to exertion.  It does seem to settle with her quieting down.  PAST MEDICAL HISTORY:  Notable for hyperlipidemia for which she was started on Lipitor but this was discontinued a couple of days ago because of muscular aches, particularly in her ankles.  She also has hypertension, GE reflux disease which she thinks is clearly distinct from her current symptoms, and occasional headaches.  She has some problems with nocturnal apnea.  PAST SURGICAL HISTORY:  Notable for breast reduction surgery,  TAH-BSO.  FAMILY HISTORY:  Notable for heart disease and diabetes.  SOCIAL HISTORY:  She lives in [place] with her husband.  She does not use cigarettes or alcohol.  MEDICATIONS:  At present, include Altace 10, Lopressor 50 b.i.d., Plavix 75 q.d., Protonix 40 q.d.  ALLERGIES:  None.  PHYSICAL EXAMINATION:  GENERAL:  On examination this afternoon, she is an obese Caucasian female who looks older than her stated age.  VITAL SIGNS:  Blood pressure 144/70.  Pulse was 60 and regular.  Respirations were 14 unlabored.  HEENT:  Demonstrated no scleral icterus, no xanthomata.  It was somewhat ruddy.  NECK:  Neck veins were flat bilaterally.  Carotids were brisk bilaterally without bruits.  It should be noted that Dr. Carollee Herter auscultated bruit in October for which carotid Dopplers were recommended and for which I see no records.  BACK:  Without kyphosis, scoliosis.  LUNGS:  Clear.  CARDIAC:  Heart sounds were regular.  An S4 gallop was present.  There were no murmurs.  ABDOMEN:  Soft with active bowel sounds without midline pulsation or hepatomegaly.  EXTREMITIES:  Femoral pulses were 2+.  Distal pulses were intact.  There was no clubbing or cyanosis.  SKIN:  Warm and dry.  LYMPHATICS:  Lymph node exam was negative.  LABORATORY:  Electrocardiogram demonstrates sinus rhythm at 56 with intervals at 0.19/0.08/0.42 with an axis of 35 degrees.  Electrocardiogram was otherwise normal.  Laboratories are notable for a sodium of 142, potassium of 4.0, chloride 109,  bicarbonate of 28, BUN of 11, creatinine 0.7, glucose 82.  Hemoglobin was 14.4.  Platelet count was 212.  INR was normal.  CKs and troponins were also normal.  IMPRESSION: 1. Recurrent chest pain with typical and atypical features. 2. Known coronary artery disease with prior intervention to her right coronary    artery, ramus, and left anterior descending artery and diagonal #1 with    some in-stent restenosis in  October 2002. 3. Normal left ventricular function. 4. Myalgias with Lipitor, now on Pravachol. 5. Gastroesophageal reflux disease. 6. Hypertension and dyslipidemia.  Rhonda Garcia has recurrent symptoms that are somewhat atypical and they are not quite identical to her prior symptoms.  She has had a lot of intervention done.  I am not quite sure whether her pain is cardiac in origin or not nor am I sure what the best way to assess this is going to be.  I think starting to look for functional impairment of flow would be reasonable so we will undertake that initially.  This may well need to be compared to catheterization and anatomical evaluation.  Dr. Chales Abrahams, who knows her well, will be in, in the morning.  I will have him help think through the next step on her plan. Dictated by:   Nathen May, M.D., [facility] Attending Physician:  Nathen May DD:  04/19/01 TD:  04/19/01 Job: [id number]

## 2010-08-25 NOTE — Op Note (Signed)
NAME:  Rhonda Garcia, Rhonda Garcia                         ACCOUNT NO.:  [id number]   MEDICAL RECORD NO.:  [id number]                   PATIENT TYPE:  AMB   LOCATION:  DAY                                  FACILITY:  [facility]   PHYSICIAN:  R. Roetta Sessions, M.D.              DATE OF BIRTH:  [birth year]   DATE OF PROCEDURE:  09/28/2002  DATE OF DISCHARGE:                                 OPERATIVE REPORT   PROCEDURE:  Diagnostic esophagogastroduodenoscopy followed by screening  colonoscopy.   ENDOSCOPIST:  Gerrit Friends. Rourk, M.D.   INDICATIONS FOR PROCEDURE:  The patient is a 70 year old lady who is  somewhat overdue for colorectal cancer screening.  She is devoid of any  lower GI tract symptoms.  She also has a chronic intermittent, postprandial  epigastric, right upper quadrant abdominal pain.  She has had these symptoms  for nearly 2 decades.  She has symptoms, almost exclusively postprandially  precipitated by certain foods including cucumbers and certain greasy foods.  Prior right upper quadrant ultrasound demonstrated a fatty liver.  Otherwise  the hepatobiliary tree appeared normal.  HIDA scan back on June 26, 2002  demonstrated a gallbladder EF of 70%.  LFTs had been normal.  EGD is now  being done to further evaluate her upper abdominal pain, although I suspect  that it ultimately will be determined that her chronic symptoms are  secondary to her gallbladder.  Colonoscopy is now being done as a screening  maneuver.  This approach has been discussed with the patient previously.  Please see my September 17, 2002 consultation note.   PROCEDURE NOTE:  O2 saturation, blood pressure, pulse and respirations were  monitored throughout the entirety of both procedures.  Conscious sedation  both procedures: Versed 4 mg IV, Demerol 100 mg IV in divided doses.   INSTRUMENT:  Olympus video chip gastroscope and colonoscope.   FINDINGS:  Examination of the tubular esophagus revealed no mucosal  abnormalities.   The EG junction was easily traversed.   STOMACH:  The gastric cavity was empty.  It insufflated well with air.  A  thorough examination of the gastric mucosa including a retroflex view of the  proximal stomach and esophagogastric junction demonstrated no abnormalities.  The pylorus was patent and easily traversed.   DUODENUM:  The bulb and the second portion revealed a couple of tiny bulbar  erosions. Otherwise D1 and D2 appeared normal.   THERAPEUTIC/DIAGNOSTIC MANEUVERS:  None.   The patient tolerated the procedure well and was prepared for colonoscopy.   A digital rectal exam revealed no abnormalities.   ENDOSCOPIC FINDINGS:  The prep was good.   RECTUM:  Examination of the rectal mucosa including the retroflex view of  the anal verge revealed only internal hemorrhoids.   COLON:  The colonic mucosa was surveyed from the rectosigmoid junction  through the left transverse and right colon to the area of the appendiceal  orifice, ileocecal valve, and cecum.  These structures were well seen and  photographed for the record.  The patient was noted to have pancolonic  diverticula.  The remainder of the colonic mucosa appeared normal.  The  terminal ileum was easily intubated to 10 cm.  This segment of the GI tract  also appeared normal.   From this level of the scope was slowly withdrawn.  All previously mentioned  mucosal surfaces were again seen; and, no other abnormalities were observed.  The patient tolerated both procedures well.   EGD IMPRESSION:  Normal esophagus, normal stomach, a couple of bulbar  erosions otherwise normal D1 and D2.   COLONOSCOPY FINDINGS:  1. Internal hemorrhoids otherwise normal rectum.  2. Pancolonic diverticula.  The remainder of the colonic mucosa appeared     normal.  3. Normal terminal ileum.   I suspect that the patient's upper abdominal symptoms are secondary to her  gallbladder. I recommended that she just really needs to get it out.  I   suspect that the bulbar erosions may be secondary to Naprosyn, but I doubt  they explain her symptoms.   RECOMMENDATIONS:  1. While taking Naprosyn she is to continue her Protonix orally     concomitantly.  2. Diverticulosis provided to the patient.  She needs another colonoscopy in     10 years.  3. We will go ahead and make her an appointment to see Dr. Arna Snipe     (whom she has seen in the past) for consideration of laparoscopic     cholecystectomy.                                               Jonathon Bellows, M.D.    RMR/MEDQ  D:  09/28/2002  T:  09/28/2002  Job:  [id number]   cc:   Colon Flattery, MD  [address]  [place]  [place] [address]  Fax: [phone]   Bernerd Limbo. Leona Carry, M.D.  [address]  [place]  [place] [address]  Fax: [phone]

## 2010-08-25 NOTE — Discharge Summary (Signed)
NAMETALISHA, Rhonda Garcia NO.:  [id number]   MEDICAL RECORD NO.:  [id number]          PATIENT TYPE:  INP   LOCATION:  4708                         FACILITY:  [facility]   PHYSICIAN:  Learta Codding, M.D. LHCDATE OF BIRTH:  Jun 22, 1940   DATE OF ADMISSION:  10/15/2004  DATE OF DISCHARGE:  10/16/2004                                 DISCHARGE SUMMARY   DISCHARGING DIAGNOSES:  1.  Chest pain, atypical.  2.  Negative cardiac workup.  3.  Questionable chest shortness secondary to coughing.   PAST MEDICAL HISTORY:  1.  Coronary artery disease.      1.  Moderate noncritical three-vessel coronary artery disease by cardiac          catheterization August 2004.      2.  Status post percutaneous transluminal coronary angioplasty, left          anterior descending/first diagonal, and stenting, ramus intermedius,          October 2002.      3.  Non-ST elevated myocardial infarction/stent to the right coronary          artery, June 2002.      4.  Normal left ventricular function.  2.  Hypertension.  3.  Hyperlipidemia.  4.  Gastroesophageal reflux disease/hiatal hernia.  5.  Obstructive sleep apnea.  6.  Remote history of ulcers.  7.  Osteoarthritis.  8.  Status post breast reduction.  9.  Status post hysterectomy.  10. Status post cholecystectomy.  11. Recent bronchitis.   DISPOSITION:  Patient being discharged home with instructions to resume her  previous medications, including:   1.  Protonix 40 mg daily.  2.  Isosorbide 30 mg daily.  3.  Toprol XL 100 mg daily.  4.  Plavix 75 mg daily.  5.  Altace 10 mg daily.  6.  Aspirin 325 mg daily.  7.  Crestor 10 mg daily.   Continue a low-fat, low-salt diet.   Activity as tolerated.   Follow up with Dr. Andee Lineman August 23 at 1 p.m.   HOSPITAL COURSE:  Rhonda Garcia is a 70 year old Caucasian female with known  history of coronary artery disease, status post multiple prior percutaneous  interventions, followed by Dr. Andee Lineman in  our office in [place].  Rhonda Garcia  presented to [facility] emergency room with complaints of recurrent  chest discomfort over the last several days.  On presentation to [facility],  the patient was treated with nitroglycerin and placed on heparin.  She was  also treated for recurrent nausea.  Cardiac enzymes initially negative.  EKG  without acute changes.  Patient transferred to [facility] for further  diagnostic testing.  Serial cardiac markers were cycled and negative x3.  It  was also noted, according to the patient she had a recent stress test  reportedly done in [place].  Last stress test results available are from July  2005, at which time the patient had gated stress test done, which was  negative for ischemia.  EF calculated at 73%.  Dr. Andee Lineman in to see patient  on day  of discharge.  Patient without further complaints of discomfort.  Denies shortness of breath.  Patient to be discharged home and follow up  with Dr. Andee Lineman.   LABORATORY WORK THIS ADMISSION:  Lipid panel showing a total cholesterol of  99, triglycerides 87, HDL 35, LDL 47.  Hemoglobin A1c 6.9.  Hemoglobin 12.3,  hematocrit 36.9.  Also note on the lab work, chemistry drawn at [facility]:  Glucose calculated at 156, BUN 6, creatinine 0.8, potassium 3.7.  Patient strongly encouraged to increase her exercise and decrease  carbohydrates and sugars in her diet.       MB/MEDQ  D:  10/16/2004  T:  10/16/2004  Job:  [id number]

## 2010-08-25 NOTE — Discharge Summary (Signed)
[place]  Patient:    Rhonda Garcia, Rhonda Garcia                      MRN: [id number] Adm. Date:  [id number] Disc. Date: 09/24/00 Attending:  Colon Branch Dictator:   Joellyn Rued, P.A.-C. CC:         Dr. Neita Carp  [facility], [address], [address], [place], [place]   Referring Physician Discharge [place]  DATE OF BIRTH:  [birth year]  SUMMARY OF HISTORY:  [name] is a 71 year old white female who is transferred to [facility] from [facility] for evaluation of chest discomfort. She states that she has had at least three episodes of chest discomfort while walking in the parking lot, one of which was the day prior to at [facility] when she was visiting her son who just had a renal artery stent. Patient felt it resembled indigestion - she described it as a slight tightness.  She was unable to sleep on the evening prior to admission.  She went to the hospital at [facility] around noon.  She received sublingual nitroglycerin with relief of her discomfort but developed a subsequent headache.  She also feels that she has had this discomfort in the past and she stated that a gallbladder evaluation was negative.  She has a history of borderline hypertension, occasional tobacco, mild obesity, early family history with her brother having an MI at the age of 60 and her son just had a renal artery stent.  LABORATORY DATA:  At [facility] H&H was 15.2 and 45.0, normal indices, platelets 232, wbcs 8.8.  Sodium was 139, potassium 3.8, BUN 8, creatinine 0.6, glucose 124.  Total CK was 108 with MB 10.7, relative index 9.9, troponin was elevated at 0.52.  EKG showed normal sinus rhythm, nonspecific ST-T wave changes.  After transfer to [facility], subsequent CK was 116 with an MB of 8.6, relative index 7.4, troponin 0.56  Subsequent enzymes and troponins were declining.  Subsequent chemistries and hematologies were unremarkable.  PT  was 14.0, PTT 45 on admission to [facility].  Fasting lipids showed a total cholesterol 170, triglycerides 107, HDL 47, LDL 102.  Abdominal ultrasound showed probable mild fatty infiltration of the liver, otherwise negative.  EKGs continued to show sinus bradycardia, normal sinus rhythm, nonspecific ST-T wave changes, early R.  HOSPITAL COURSE:  [name] was admitted to [facility].  She was continued on IV nitroglycerin and Lovenox, and catheterization was arranged. On the evening of June 16 she continued to have chest discomfort which improved with increase of the nitroglycerin drip; however, continued to cause a headache.  No EKG changes were noted.  Her enzymes and troponins were abnormal and it was felt that she had had an non-Q-wave myocardial infarction. Gallbladder ultrasound was performed to evaluate her nausea and vomiting that was associated with her chest discomfort.  On June 17 Dr. Chales Abrahams performed a cardiac catheterization.  According to his progress notes, she had a 99% proximal RCA which underwent angioplasty stenting, reducing this lesion from 99% to 0%.  She had a mid 40% RCA lesion, a 60% proximal LAD lesion, 50% proximal diagonal one lesion, 30% proximal ramus, 30% proximal circumflex, 30-40% mid circumflex.  Ejection fraction was 55%.  Dr. Chales Abrahams notes that she should be on Plavix for one month.  She should have a Cardiolite in three to four weeks to assess her LAD disease.  After reviewing her lipid panel and associated coronary artery disease he also felt that she should be on a statin and he also started an ACE inhibitor.  By June 18 she was ambulating without difficulty.  Integrilin was discontinued shortly after noon.  Education and ambulation were assisted with cardiac rehab and she was discharged home on the afternoon of June 18.  DISCHARGE DIAGNOSES: 1. Non-Q-wave myocardial infarction status post angioplasty stenting of the    mid right coronary artery  as described above. 2. Residual coronary artery disease as described above. 3. Abdominal discomfort associated with nausea and vomiting with a negative    abdominal ultrasound. 4. Hyperlipidemia. 5. History as previously.  DISPOSITION:  She is discharged home.  MEDICATIONS:  Her new medications include: 1. Plavix 75 mg q.d. for four weeks. 2. Coated aspirin 325 q.d. for four weeks. 3. Altace 2.5 q.d. 4. Lopressor 50 mg one-half tablet b.i.d. 5. Sublingual nitroglycerin as needed. 6. Protonix 40 mg q.d. 7. Zocor 20 mg q.h.s.  She was given permission to continue her Premarin 0.625 q.d.  ACTIVITY:  She was advised no lifting, driving, sexual activity, or heavy exertion or working until seen by the physician.  DIET:  Maintain low salt/fat/cholesterol diet.  WOUND CARE:  If she has any problems with his catheterization site she was asked to call.  SPECIAL INSTRUCTIONS:  She was advised no tobacco products or smoking.  FOLLOW-UP:  She will need blood work in approximately six weeks to reevaluate LFTs and lipids with the starting of a statin drug.  She will see Dr. Andee Lineman in the [facility] office on October 11, 2000 at 10:30 a.m. DD:  09/24/00 TD:  09/24/00 Job: [id number]

## 2010-08-25 NOTE — Discharge Summary (Signed)
[place]  Patient:    Rhonda Garcia, Rhonda Garcia Visit Number: [id number] MRN: [id number]          Service Type: MED Location: [id number] Attending Physician:  Rollene Rotunda Dictated by:   Pennelope Bracken, N.P. Admit Date:  01/19/2001 Discharge Date: 01/21/2001                             Discharge Summary  DISCHARGE DIAGNOSES: 1. Unstable angina, status post stent of the right coronary artery in June    2002, stent of the left anterior descending in July 2002, treated with    balloon angioplasty and angioplasties of these stents as well as placement    of primary stent in the ramus intermedius. 2. Coronary artery disease. 3. Exogenous hormone use. 4. Hypertension. 5. Hyperlipidemia.  PROCEDURE:  Cardiac catheterization, as above, with balloon angioplasties of right coronary artery and left anterior descending stents as well as primary placement of stent to ramus intermedius.  HISTORY OF PRESENT ILLNESS:  This delightful, 70 year old, white female presented to [facility] after a spell of chest pain experienced while in the tub.  She initially presented for evaluation of this to [facility] where she was given aspirin, nitroglycerin and morphine, which relieved her pain.  She was transferred here with the following enzymes: CK/MB 64/0.6 with a troponin I of 0.1.  EKG revealed normal sinus rhythm with a rate of 73 with some left axis deviation and no signs of ischemia.  The patient had been undergoing some recent increases in emotional stress.  On admission, the patient was stabilized and transferred to the floor with continuous telemetry monitoring as well as heparin therapy per pharmacy protocol.  PHYSICAL EXAMINATION:  VITAL SIGNS:  The patients baseline vital signs were found to be blood pressure 168/75, pulse 62, respiratory rate 36 and 94% on 2 L.  NECK:  Bilateral carotid bruits were found, right greater than left.  HEART:   Regular rate and rhythm with a soft systolic ejection murmur heard best at the left sternal border.  No rub or gallop was appreciated.  LUNGS:  Clear to auscultation bilaterally.  ABDOMEN:  Obese and nontender, positive bowel sounds.  EXTREMITIES:  Without clubbing, cyanosis, or edema.  Pulses were 2+ in all extremities.  Femoral pulses were without bruit.  NEUROLOGIC:  Nonfocal.  HOSPITAL COURSE:  The patient was taken to cardiac catheterization where a successful percutaneous angioplasty of the ostium of the first diagonal branch was performed resulting in reduction of 90% narrowing to less than 50%.  The proximal mid LAD stent was also angioplastied with reduction of 60% in-stent restenosis to less than 10%.  Primary stent was placed in ramus intermedius with reduction in narrowing there from 70% to 0%.  Ventriculogram revealed normal ejection fraction.  The patient was returned to the floor and her recovery was uneventful.  She was free of chest pain on the day of discharge and her physical exam was benign.  Groin site was without significant ecchymosis, erythema or induration and no bruits or drainage were noted.  DISCHARGE LABORATORY DATA AND X-RAY FINDINGS:  Labs were followed during hospitalization.  Prior to discharge, CK was 43, CK-MB 1.3, troponin I 0.02. PT was found to be 13.6, INR 1.1, PTT was found to be 92.  Hemogram and comprehensive metabolic panels were within normal limits except for the following abnormalities: glucose slightly high at 117, total protein slightly  low at 5.6 and albumin slightly low at 2.9.  The patients lipid profile was drawn and she was found to have a total cholesterol of 156, triglycerides 159, HDL 40 and LDL 84.  Magnesium was within normal limits at 2.0 as was TSH at 3.315.  CONDITION ON DISCHARGE:  The patient is discharged to home in stable condition.  DISCHARGE MEDICATIONS: 1. Altace 10 mg one q.d. 2. Nitroglycerin 0.4 mg SL  p.r.n. 3. Valtrex one daily. 4. Lipitor 20 mg q.d. 5. Plavix 75 mg one q.d. x 90 days. 6. Toprol XL 50 mg one q.d. 7. Aspirin 81 mg one q.d.  SPECIAL INSTRUCTIONS:  The patient is advised to discontinue her exogenous hormone use and in review of her current angina.  The patient is aware of the need to seek treatment for any new chest symptoms and agrees to do this.  ACTIVITY:  She is advised not to engage in heavy activity x 1 week.  DIET:  She agrees to adhere to a low salt, low fat, low cholesterol diet.  WOUND CARE:  Observe catheterization site for drainage or bruising.  FOLLOWUP:  She is to follow up with sleep study on November 18, at 1:30 p.m. and will see her family physician in one week.  She will return to see Dr. Diona Browner in [place] on October 29, at 10:45 a.m. Dictated by:   Pennelope Bracken, N.P. Attending Physician:  Rollene Rotunda DD:  01/21/01 TD:  01/21/01 Job: [id number]

## 2010-08-25 NOTE — Discharge Summary (Signed)
NAME:  Rhonda Garcia, Rhonda Garcia                         ACCOUNT NO.:  [id number]   MEDICAL RECORD NO.:  [id number]                   PATIENT TYPE:  INP   LOCATION:  2021                                 FACILITY:  [facility]   PHYSICIAN:  Vida Roller, M.D.                DATE OF BIRTH:  12-Nov-1940   DATE OF ADMISSION:  11/28/2002  DATE OF DISCHARGE:  11/30/2002                           DISCHARGE SUMMARY - REFERRING   PROCEDURE:  Coronary angiogram on November 30, 2002.   REASON FOR ADMISSION:  Please refer to dictated admission note.   LABORATORY DATA:  Cardiac enzymes:  CK, MB, and troponin-I markers normal  (x3).  Liver profile:  Total cholesterol 130, triglycerides 107, HDL 38, LDL  71, (ratio 3.4).  TSH 1.8.  Hemoglobin A1C 6.7.   HOSPITAL COURSE:  Following initial presentation to [facility] for  evaluation of chest pain, arrangements were made to transfer to [facility] for further management and diagnostic evaluation.  Initial cardiac  markers at [facility] were normal, and there were no significant  electrocardiogram changes.   Following transfer, additional followup of cardiac enzymes were also within  normal limits.  The patient was maintained on Lovenox over the weekend and  continued on her previous home medication regimen, which consisted of  cholesterol lowering agents, Altace and Toprol.   The patient was referred for coronary angiography on same day of scheduled  discharge, with procedure performed by Dr. Veneda Melter (see report for full  details).  Angiography revealed moderate diffuse coronary artery disease  with no high-grade lesion.  Of note, there was 30% proximal in-stent  restenosis of the LAD and 50% proximal in-stent restenosis of the ramus  intermedius, as well as 10% in-stent restenosis of the right coronary  artery.  LV gram was normal.   Dr. Chales Abrahams recommended proceeding with further studies as an outpatient.  The  patient will be  scheduled for a Dobutamine echocardiogram stress test.  Additionally, the patient was noted to have left carotid bruit on initial  presentation.  She will have outpatient carotid Dopplers as well.   MEDICATION ADJUSTMENTS THIS ADMISSION:  Addition of Plavix and initiation of  Crestor, once the patient has completed her course of Pravachol.   DISCHARGE MEDICATIONS:  1. Plavix 75 mg daily (new).  2. Toprol XL 100 mg daily.  3. Pravachol 40 mg daily.  4. Protonix 40 mg daily.  5. Altace 10 mg daily.  6. Aspirin 162 mg daily.  7. IMDUR 30 mg daily.  8. Foltx one tablet daily.  9. Os-Cal twice daily.  10.      Motrin as previously directed.   INSTRUCTIONS:  No heavy lifting, driving x2 days;  maintain low fat/low  cholesterol diet;  call the office if there is any swelling/bleeding of the  groin.  The patient is scheduled for a Dobutamine stress  echocardiogram/carotid Dopplers on December 15, 2002, at 1:15 p.m.  The  patient will then follow up with Dr. Lewayne Bunting on December 22, 2002, at  9:30 a.m. at the [facility] office.  She will then return for continued  cardiac followup with Dr. Andee Lineman at the [facility] office.   DISCHARGE DIAGNOSES:  1. Unstable angina pectoris/known coronary artery disease.     a. Normal serial cardiac markers.     b. Moderate, diffuse coronary artery disease with no high-grade lesions,        coronary angiogram on November 30, 2002.     c. Status post multiple prior percutaneous interventions and history of        in-stent restenosis.     d. Normal left ventricular function.  2. Left carotid bruit.  3. Hypertension.  4. Dyslipidemia.  5. Hyperglycemia.  6. Gastroesophageal reflux disease/hiatal hernia.      Gene Serpe, P.A. [facility]                      Vida Roller, M.D.    GS/MEDQ  D:  11/30/2002  T:  11/30/2002  Job:  [id number]   cc:   Colon Flattery, MD  [address]  [place]  [place] [address]  Fax: [phone]   [facility]  [address]  [place], [place] [address]

## 2010-08-25 NOTE — H&P (Signed)
NAMEDENEANE, Garcia               ACCOUNT NO.:  [id number]   MEDICAL RECORD NO.:  [id number]          PATIENT TYPE:  INP   LOCATION:  NA                           FACILITY:  [facility]   PHYSICIAN:  Gene Serpe, P.A. [facility]   DATE OF BIRTH:  Jan 09, 1941   DATE OF ADMISSION:  DATE OF DISCHARGE:                                HISTORY & PHYSICAL   REASON FOR ADMISSION:  [name] is a 70 year old female with known  coronary artery disease, status post multiple prior percutaneous  interventions, followed in our clinic in [place], now transferred from [facility] with recurrent chest discomfort over the last  several days.   The patient reports having had a recent stress test in [place], which was  reportedly negative (records currently unavailable).  However, she continues  to experience recurrent chest discomfort, which is not strictly related to  exertion.  She does take nitroglycerin on occasion with reported relief.  Her symptoms typically last approximately 15 minutes and are not exacerbated  by deep inspiration, change of position, or swallowing food.  There is,  however, some associated nausea, diaphoresis, and dyspnea.  Of note, the  patient also reports having recent bronchitis and wonders whether or not  her pain may be related to persistent coughing.   On presentation to [facility] Emergency Room, patient was treated  with nitroglycerin and placed on heparin.  She was also treated with  Dilaudid and Compazine for recurrent nausea.  Initial cardiac markers are  negative, and electrocardiogram shows no acute changes.   ALLERGIES:  PHENERGAN.   MEDICATIONS PRIOR TO ADMISSION:  1.  Protonix.  2.  Imdur 30 daily.  3.  Toprol-XL 100 daily.  4.  Plavix.  5.  Altace 10 daily.  6.  Aspirin 81 daily.  7.  Crestor (unknown dose).   PAST MEDICAL HISTORY:  1.  Coronary artery disease.      1.  Moderate, noncritical three-vessel coronary artery disease by   cardiac catheterization, August 2004.      2.  Status post PTCA left anterior descending (2)/first diagonal and          stenting ramus intermedius, October 2002.      3.  Non-STEMI/stent right coronary artery, June 2002.      4.  Normal left ventricular function.  2.  Hypertension.  3.  Hyperlipidemia.  4.  Gastroesophageal reflux disease/hiatal hernia.  5.  Obstructive sleep apnea.  6.  Remote ulcers.  7.  Osteoarthritis.  8.  Status post breast reduction.  9.  Status post hysterectomy.  10. Status post cholecystectomy (approximately three months ago).   SOCIAL HISTORY:  The patient lives in [place], is unemployed.  She denies  alcohol or tobacco use.   FAMILY HISTORY:  Notable for her mother having died of a heart attack.  She  also has a son, who reportedly has had multiple percutaneous interventions.   REVIEW OF SYSTEMS:  Has chronic exertional dyspnea, has been no recent  orthopnea, paroxysmal nocturnal dyspnea, or lower extremity edema.  Has had  a persistent, nonproductive  cough for the last two weeks but denies any  associated fever.  Has been plagued by nausea over these past few days but  denies any evidence of hemoptysis, hematuria, bright red blood per rectum,  or melena.  Otherwise as noted per HPI.  The remaining systems negative.   PHYSICAL EXAMINATION:  VITAL SIGNS:  Blood pressure 160/74, pulse 55,  regular respirations 18, temperature 96.4.  GENERAL:  A 70 year old female in no apparent distress.  HEENT:  Normocephalic, atraumatic.  NECK:  Preserved bilateral carotid pulses without bruits or JVD.  LUNGS:  Clear to auscultation bilaterally.  HEART:  Regular rate and rhythm (S1 and S2).  Soft grade 2/6 systolic  ejection murmur in upper LSD.  ABDOMEN:  Soft, nontender, intact bowel sounds, no noted bruits.  EXTREMITIES:  Preserved bilateral femoral and distal pulses without bruits  or significant lower extremity.  NEURO:  No focal deficit.   ELECTROCARDIOGRAM  [facility] ER):  Normal sinus rhythm at 67 BPM with normal  axis and no ischemic changes.   LABORATORY DATA [facility] ER):  CPK 95/1.7, troponin-I 0.02, sodium 140,  potassium 3.7, glucose 156, BUN 6, creatinine 0.8, WBC 6, hemoglobin 13.9,  hematocrit 41, platelet 208, INR 1.7.   CHEST X-RAY:  Pending.   IMPRESSION:  1.  Recurrent chest discomfort.  2.  Known coronary artery disease.      1.  Status post multiple prior percutaneous interventions.  3.  Recurrent nausea/vomiting.  4.  Recent bronchitis.  5.  Hypertension.  6.  Hyperlipidemia.   PLAN:  The patient will be admitted to telemetry for further evaluation and  rule out of myocardial infarction.  We will continue current regimen, which  includes intravenous nitroglycerin and heparin.  Serial cardiac markers will  be  cycled and, if positive, plan is to proceed with cardiac catheterization in  the morning.  If, however, the patient rules out for a myocardial  infarction, then recommendation is to review recent stress tests reportedly  done in [place].  If this reveals any abnormality, the recommendation is to  proceed with cardiac catheterization as well.       GS/MEDQ  D:  10/15/2004  T:  10/15/2004  Job:  [id number]   cc:   Delaney Meigs, M.D.  [address]  [place]  [place] [address]  Fax: [phone]

## 2010-08-25 NOTE — H&P (Signed)
NAME:  Rhonda Garcia, Rhonda Garcia                         ACCOUNT NO.:  [id number]   MEDICAL RECORD NO.:  [id number]                   PATIENT TYPE:  EMS   LOCATION:  ED                                   FACILITY:  [facility]   PHYSICIAN:  R. Roetta Sessions, M.D.              DATE OF BIRTH:  [birth year]   DATE OF ADMISSION:  09/17/2002  DATE OF DISCHARGE:                                HISTORY & PHYSICAL   HISTORY OF PRESENT ILLNESS:  Rhonda Garcia is a 70 year old lady with  intermittent epigastric right upper quadrant abdominal pain which she has  had off and on for nearly two decades.  She has intermittent nausea  associated with this.  Symptoms are exacerbated by certain foods such as  cucumbers and certain greasy foods.  It tends to be ameliorated with  fasting, but sometimes can occur in the fasting state.  No change in bowel  functions.  Denies melena, constipation, hematochezia and diarrhea.   She originally saw Dr. Egbert Garibaldi, [facility] surgeon, who did not feel her gallbladder  needed to come out.  Recently, she was seen in April in the emergency  department and underwent an evaluation with abdominal ultrasound which  revealed a fatty liver, and there was focal tenderness in the region of the  gallbladder with a transducer, but otherwise, the gallbladder and  hepatobiliary appeared normal.  HIDA scan was obtained on June 26, 2002.  Gallbladder ejection fraction was 70%.  Minimal intestine activity seen  within the first hour of the study.  LFTs from Aug 21, 2002, were entirely  normal.   Rhonda Garcia has had typical gastroesophageal reflux disease symptoms that are  extremely well controlled on Protonix 40 mg orally daily.  She does not have  any odynophagia or dysphagia.  She missed her June 22, 2002, appointment  with me.  She was last seen here on April 23, 2002.  She is somewhat  overdue for screening colonoscopy.  She had her last colonoscopy in 1993.  There is no family history of colorectal  neoplasia.  She has had a barium  esophagram back in 2002 which demonstrated a small hiatal hernia, but no  impairment to pass her 12.5 mm pill.  She has not had an upper  gastrointestinal endoscopy.  She does take Naprosyn on a fairly regular  basis.   PAST MEDICAL HISTORY:  1. Significant recent diagnosis of sleep apnea for which she is going to be     starting on CPAP in the near future.  2. History of coronary artery disease status post angioplasty in 2002.  3. History of hypercholesterolemia.  4. History of hypertension.  5. Hormone replacement therapy.  6. Gastroesophageal reflux disease as stated above.   PAST SURGICAL HISTORY:  Hysterectomy, incidental appendectomy, cardiac  catheterization, breast reduction, mammoplasty.   MEDICATIONS:  1. Altace 10 mg daily.  2. Protonix 40 mg once daily.  3.  Folate once daily.  4. Pravachol 40 mg daily.  5. Toprol 100 mg daily.  6. Isosorbide 30 mg daily.  7. Naproxen 500 mg p.r.n.  8. Xanax 0.25 mg p.r.n.  9. Procarbazine 10 mg p.r.n.   FAMILY HISTORY:  Negative for chronic GI or liver disease.  Positive for  hypertension, diabetes.  Mother had a massive stroke.  No history of  chronic GI or liver illness.   SOCIAL HISTORY:  The patient has been married for 46 years, has three  children.  She has been employed as a Designer, television/film set.  She has never  been a smoker.  No alcohol use.   REVIEW OF SYSTEMS:  As in History and Physical examination.   HISTORY OF PRESENT ILLNESS:  GENERAL APPEARANCE:  This was a pleasant 32-  year-old lady, resting comfortably.  VITAL SIGNS:  Weight 202.5, blood pressure 136/68, pulse 68.  SKIN:  Warm and dry.  No jaundice.  No cutaneous stigmata chronic liver  disease.  HEENT:  No oral lesion.  JVD is not prominent.  CHEST:  Lungs are clear to auscultation.  CARDIAC:  Regular rate and rhythm without murmurs, rubs or gallops.  ABDOMEN:  Nondistended, positive bowel sounds, soft, nontender  without  appreciable mass or organomegaly.  EXTREMITIES:  No edema.   IMPRESSION:  This is a pleasant 70 year old lady with a very long history of  intermittent epigastric right upper quadrant abdominal pain which is  somewhat suspicious for biliary pathology.  Although, recent labs and  radiographic studies have failed to objectively demonstrate gallbladder  disease.   She does take Naproxen.  Her reflux symptoms work well on Protonix 40 mg  orally daily.  In the final analysis, I suspect Rhonda Garcia will probably  come to cholecystectomy in the near future.  However, we need to make sure  that she does not have any pathology in the upper GI tract.  In addition,  she is really somewhat overdue for screening colonoscopy.  I have offered  Rhonda Garcia an EGD and a colonoscopy in the near future for the above stated  reasons.  Potential risks, benefits and alternatives have been reviewed.  Further recommendations to follow after these studies have been performed.                                               Jonathon Bellows, M.D.    RMR/MEDQ  D:  09/17/2002  T:  09/17/2002  Job:  [id number]   cc:   Colon Flattery, MD  [address]  [place]  [place] [address]  Fax: [phone]

## 2010-08-25 NOTE — Cardiovascular Report (Signed)
NAMEJARIANA, Garcia NO.:  [id number]   MEDICAL RECORD NO.:  [id number]          PATIENT TYPE:  INP   LOCATION:  4704                         FACILITY:  [facility]   PHYSICIAN:  Arvilla Meres, M.D. LHCDATE OF BIRTH:  1940-09-02   DATE OF PROCEDURE:  08/24/2005  DATE OF DISCHARGE:                              CARDIAC CATHETERIZATION   PRIMARY CARE PHYSICIAN:  Dr. Olena Leatherwood in [place].   CARDIOLOGIST:  Dr. Lewayne Bunting in the [place] office.   PATIENT IDENTIFICATION:  [name] is a 70 year old woman with multiple  medical problems including known coronary artery disease status post  multiple previous interventions.  Also has a history of obesity,  hypertension, and apparently newly diagnosed diabetes.  She was admitted  with a six-week history of significant chest and left shoulder pain which  has been mostly at exertion, but also can occur at rest.  She has had some  relief with nitroglycerin.  She was admitted with unstable angina.  She  ruled out for myocardial infarction with serial cardiac enzymes and is  brought to the cardiac catheterization laboratory.   PROCEDURES PERFORMED:  1.  Selective coronary angiography.  2.  Left heart catheterization.  3.  Left ventriculogram.  4.  Abdominal aortogram.  5.  AngioSeal femoral closure.   DESCRIPTION OF THE PROCEDURE:  The risks and benefits of the procedure  explained.  Consent was signed and placed on the chart.  A 6-French arterial  sheath was placed in the right femoral artery using a modified Seldinger  technique.  Standard preformed catheters including Judkins JL4, JR4, and  angled pigtail were used for procedure.  All catheter exchanges were made  over a wire.  There were no apparent complications.  At the end the  procedure patient's right arteriotomy site was closed with an AngioSeal  closure device.  There was good hemostasis.   Central aortic pressure was 173/80 with a mean of 116.  LV pressure was  173/7  with an EDP of 16.  There was no aortic stenosis.   Left main was normal.   Left circumflex was a moderate to large sized system.  There was moderate  diffuse disease throughout.  There was a large ramus, large OM1, and a  moderate OM2.  There was a tandem 40% lesions proximally in the left  circumflex.  In the mid to distal circumflex there was a tandem 30-40%  lesions.   The LAD was a long vessel wrapping the apex.  It gave off a large first  diagonal and a small second diagonal.  There was evidence of a long stent  previously placed in the proximal to mid LAD.  The stent was widely patent.  After the stent there was an area of focal stenosis about 50-60%.  This did  not appear flow-limiting.   Right coronary artery was a large dominant vessel.  It gave off a small RV  branch, a large acute marginal, a moderate sized PDA, and three small PLs.  There is a 30% proximal stenosis, a 50% mid stenosis, and a 40% distal  stenosis before  the takeoff of the PDA.   Left ventriculogram done in the RAO approach showed an EF of 65% with no  wall motion abnormalities.  Abdominal aortogram showed dual renal arteries  bilaterally which were widely patent.  There was moderate abdominal aortic  plaquing with no evidence of aneurysm.   ASSESSMENT:  1.  Non-obstructive coronary disease.  2.  Normal left ventricular function.  3.  Poorly controlled hypertension.   PLAN:  Aggressive medical therapy.  She has been having trouble getting her  medications so we will switch her all to generics.  She will follow with Dr.  Andee Lineman.  Hopefully she can be discharged home after bedrest if her groin  remains stable.  We will be discharging her on Lopressor 50 mg b.i.d.,  lisinopril 20 a day, aspirin 162 a day, nitroglycerin patch 0.4 mg per hour,  and simvastatin 40.  Follow up with Dr. Andee Lineman next week with a check of  BMET.      Arvilla Meres, M.D. [facility]  Electronically Signed     DB/MEDQ  D:   08/24/2005  T:  08/24/2005  Job:  [id number]   cc:   Rhonda Garcia  Fax: [phone]   Rhonda Garcia, M.D. [facility]  [address]  [address]  [place]  [place] [address]

## 2010-08-25 NOTE — Discharge Summary (Signed)
[place]  Patient:    Rhonda Garcia, Rhonda Garcia Visit Number: [id number] MRN: [id number]          Service Type: MED Location: [phone] Attending Physician:  Rollene Rotunda Dictated by:   Pennelope Bracken, N.P. Admit Date:  01/19/2001 Discharge Date: 01/21/2001                             Discharge Summary  DISCHARGE DIAGNOSES: 1. Unstable angina, treated this admission with percutaneous coronary    intervention of ramus intermedius and angioplasties of left anterior    descending and diagonal arteries. 2. Coronary artery disease. 3. Hyperlipidemia. 4. Hypertension. 5. Gastroesophageal reflux disease. 6. Exogenous hormone replacement therapy.  PROCEDURES PERFORMED THIS ADMISSION: 1. Cardiac catheterization with PCI of the ramus intermedius, primary stent    placement there and balloon angioplasty of the LAD and diagonal. 2. Exogenous hormone therapy.  HISTORY OF PRESENT ILLNESS:  This delightful 70 year old white female has a long history of CAD, with stent placement in June 2002, in both RCA and LAD. One month following this procedure, she developed recurrent chest pain and was again catheterization by Dr. Chales Abrahams, with Dictated by:   Pennelope Bracken, N.P. Attending Physician:  Rollene Rotunda DD:  01/21/01 TD:  01/21/01 Job: [id number]

## 2010-08-25 NOTE — Cardiovascular Report (Signed)
[place]  Patient:    Rhonda Garcia, Rhonda Garcia Visit Number: [id number] MRN: [id number]          Service Type: MED Location: [phone] Attending Physician:  Colon Branch Proc. Date: 09/23/00 Admit Date:  09/21/2000   CC:         Dr. Burnett Kanaris C. Eden Emms, M.D. [facility]   Cardiac Catheterization  PROCEDURES PERFORMED: 1. Left heart catheterization. 2. Left ventriculogram. 3. Selective coronary angiography. 4. Percutaneous transluminal coronary angioplasty and stenting of the mid    right coronary artery.  DIAGNOSES: 1. Two-vessel coronary artery disease. 2. Normal left ventricular systolic function. 3. Non-Q-wave myocardial infarction.  INDICATIONS:  The patient is a 70 year old white female, with multiple cardiac risk factors, who presents with substernal chest discomfort.  The patient had initial symptoms of exertion and these proceeded to occur at rest and she was admitted to the hospital.  The patient was treated aggressively with aspirin, heparin and Integrilin, and had improvement of the pain.  Cardiac enzymes were positive suggestive of a non-Q-wave myocardial infarction.  She presents now for further cardiac assessment.  TECHNIQUE:  After informed consent was obtained, the patient was brought to the cardiac catheterization lab.  A 6 French sheath was placed in the right femoral artery.  Left heart catheterization and selective angiography were then performed in the usual fashion using preformed 6 French Judkins catheters.  FINDINGS:  Initial findings are as follows: 1. Left main trunk:  The left main trunk is a large caliber vessel with mild    irregularities. 2. LAD:  This a medium caliber vessel that provides a large first diagonal    branch in the proximal segment and a trivial second diagonal branch    distally.  The LAD has concentric stenosis of 60% in its proximal segment    just prior to the diagonal branch.   Immediately following the first    diagonal branch is a focal narrowing of 50%.  The distal LAD has mild    diffuse disease of not greater than 20-30%.  The first diagonal branch    appears to have an ostial narrowing of 50%. 3. Left circumflex artery:  This is a medium caliber vessel that consists of    two marginal branches.  There is diffuse disease of 30% in the AV    circumflex.  The second marginal branch has narrowings of 30-40%    in the proximal segment. 4. Ramus intermedius:  This is a medium caliber vessel.  There is a proximal    narrowing of 30%. 5. Right coronary artery:  Right coronary artery is dominant.  This is a    medium caliber vessel that provides a small posterior descending and    posterior ventricular branch in its terminal segment.  There is high-grade    hazy narrowing of 99% in the mid section of the right coronary artery.    The mid RCA has narrowings of 30-40% near the takeoff of the small RV    marginal branch and the distal vessel has mild irregularities.  LEFT VENTRICULOGRAM:  Normal end-systolic and end-diastolic dimensions. Overall left ventricular function is well preserved.  Ejection fraction of greater than 55%.  No mitral regurgitation.  LV pressure is 160/15, aortic is 150/60.  LVEDP equals 22.  These findings were reviewed with the patient with treatment options including percutaneous intervention to the right coronary artery and further assessment of the LAD at a later date  versus coronary artery bypass graft surgery.  At this point, the patient preferred percutaneous treatment.  DESCRIPTION OF PROCEDURE: The 6 French sheath was exchanged for a 7 French sheath and heparin supplemented to maintain ACT of approximately 225 seconds. She was also given Plavix 300 mg orally.  She was continued on her Integrilin drip.  A 7 [place] guide catheter was used to engage the right coronary artery and a 0.014 inch Patriot wire advanced in the distal RCA.   A 2.75 x 13 mm Penta stent was introduced and primarily deployed in the mid RCA at 9 atmospheres for 60 seconds.  Repeat angiography showed an excellent result with significant improvement in vessel lumen.  There was mild residual waste in the mid section of the stent.  The stent delivery balloon was used to further dilate the stent at 14 atmospheres for 30 seconds.  Angiography showed mild waste and we elected to proceed with post-dilatation using a 3.0 x 8 mm PowerSail.  Two inflations were performed within the stent at 12 atmospheres for 30 seconds each.  Repeat angiography showed an excellent result with no residual stenosis.  There was TIMI-3 flow through the right coronary artery and no evidence of distal vessel damage.  The guide catheter was then removed and the sheath secured in position.  The patient had chest discomfort with balloon inflations consistent with pain on presentation.  These resolved after balloon deflation.  The patient was then transferred to the holding area where the sheath can be removed when the ACT returns to normal.  She tolerated the procedure well.  FINAL RESULT:  Successful percutaneous transluminal coronary angioplasty and stenting of the mid right coronary artery with reduction of 99% narrowing to 0% with placement of a 2.75 x 15 mm Penta stent dilated to 3.0 mm.  ASSESSMENT AND PLAN:  The patient is a 70 year old, female, status post non-Q-wave myocardial infarction due to subtotal occlusion of the right coronary artery.  She has now undergone intervention of this vessel.  There is significant residual disease in the left anterior descending encompassing the first diagonal branch.  Further assessment may be made in several weeks time with a stress imaging study to determine if there is any ischemia. Consideration may then be given towards percutaneous intervention if  necessary. Attending Physician:  Colon Branch DD:  09/23/00 TD:   09/23/00 Job: [id number]

## 2010-08-25 NOTE — H&P (Signed)
NAME:  Rhonda Garcia, Rhonda Garcia                         ACCOUNT NO.:  [id number]   MEDICAL RECORD NO.:  [id number]                   PATIENT TYPE:  INP   LOCATION:  2021                                 FACILITY:  [facility]   PHYSICIAN:  Vida Roller, M.D.                DATE OF BIRTH:  Oct 29, 1940   DATE OF ADMISSION:  11/28/2002  DATE OF DISCHARGE:                                HISTORY & PHYSICAL   PRIMARY CARE PHYSICIAN:  Colon Flattery, M.D.   CARDIOLOGIST:  Veneda Melter, M.D.   HISTORY OF PRESENT ILLNESS:  [name] is a 70 year old woman with  multiple cardiac risk factors and known coronary artery disease, status post  PTI on three different occasions, the last one in October of 2002.  She has  been previously evaluated in January of 2003 for chest discomfort, was ruled  out by myocardial infarction, had a normal exercise Cardiolite, and now  comes in with worsening chest discomfort.  She was admitted to [facility] and transferred to [facility] via [facility] for chest discomfort.  She had been well for about 14 months until today when she noticed sudden  onset of heaviness in her chest which radiated to her jaw associated with a  tightness.  She had been walking her dog when it occurred.  It was  associated with some shortness of breath and a little bit of diaphoresis.  She was taken to the hospital there at [facility], where she received two  sublingual nitroglycerins with some relief.  She is treated with Lovenox and  transported to [place].   PAST MEDICAL HISTORY:  1. Coronary artery disease, status post a percutaneous coronary intervention     to her right coronary artery in June of 2002 in the setting of an acute     non-ST segment elevation myocardial infarction.  She then underwent a     second stenting of her left anterior descending coronary artery in July     of 2002.  She then had balloon angioplasty of in stent restenosis to her     LAD and diagonal and underwent a third  stenting procedure at that time in     October of 2002.  She has normal left ventricular function on both the     catheterization in October of 2002 as well as on Cardiolite in January of     2003.  2. She has hypertension.  3. Hypercholesterolemia.  4. She is status post TAH-BSO.  5. Breast reduction surgery.  6. She also has a hiatal hernia.   ALLERGIES:  1. PHENERGAN.  2. LIPITOR.  She is intolerant.   MEDICATIONS:  1. She was previously on Toprol 100 mg a day.  2. Protonix 40 mg a day.  3. Pravachol 40 mg a day.  4. Altace 10 mg a day.  5. Aspirin 160 mg a day.  6. Imdur 30 mg a  day.  7. Os-Cal 1000 mg twice a day.  8. Motrin if she needs it for pain.   SOCIAL HISTORY:  She lives in [place] by herself.  She is separated.  She has  one son who has coronary artery disease.  Her mother died at age 69 of a  stroke.  Father died in his 9s of an unknown cause.  She has one brother  who died of an MI in his 63s, another who died of an MI in his 60s.  Her son  has coronary disease as well.   REVIEW OF SYSTEMS:  Essentially noncontributory except for that reviewed in  the history of present illness.   PHYSICAL EXAMINATION:  GENERAL:  She is a well-developed, well-nourished  white female in no apparent distress, is alert and oriented x4.  VITAL SIGNS:  Blood pressure is 128/82, heart rate is 50 in sinus,  respiratory rate is 20.  She is afebrile.  HEENT:  Unremarkable.  NECK:  Supple.  There is no jugular venous distention.  There is a left  carotid bruit noted.  ABDOMEN:  Soft, nontender.  Normal active bowel sounds.  LUNGS:  Clear to auscultation.  CARDIAC:  Reveals a nondisplaced point of maximal impulse with no lifts or  thrills.  First and second heart sounds are normal.  There is no third or  fourth heart sound.  No murmurs are noted.  GENITOURINARY/RECTAL:  Exams are deferred.  EXTREMITIES:  Lower extremities have 1+ pulses throughout without any  significant bruits.   She has no edema.  NEUROLOGIC:  Exam is nonfocal.   LABORATORY DATA:  Chest x-ray shows mild cardiomegaly, but otherwise no  acute disease, as per the emergency department notes.  Her electrocardiogram  shows sinus rhythm at a rate of 61 with left axis deviation, normal  intervals, normal axes, nonspecific ST/T-wave changes.   White blood cell count of 5.3, hemoglobin and hematocrit of 13 and 38 with a  platelet count of 161,000.  Sodium 142, potassium 4.1, chloride 113, bicarb  of 27, BUN and creatinine are 12 and 0.8 with a blood sugar of 130.  Her  first set of cardiac enzymes showed a CK of 101 with an MB fraction of 2.7  and troponin of 0.02.  Her INR is 1.   IMPRESSION:  We have a lady with unstable angina, known coronary disease  with multiple percutaneous coronary interventions, who has now a change in  her angina pattern which is concerning for unstable angina.  She has  multiple cardiac risk factors as previously stated.  I think it is probably  reasonable at this point to treat her with nitroglycerin, Lovenox, aspirin,  and beta blockers, and as time goes on, will see if she develops any  enzymatic leak, but I think overall the best idea for her is probably  another heart catheterization to assess whether or not she has obstructive  coronary artery disease.                                                  Vida Roller, M.D.    JH/MEDQ  D:  11/28/2002  T:  11/29/2002  Job:  [id number]

## 2010-08-25 NOTE — Discharge Summary (Signed)
[place]  Patient:    Rhonda Garcia, Rhonda Garcia                        MRN: [id number] Adm. Date:  11/02/00 Disc. Date: 11/05/00 Attending:  Nathen May, M.D. [facility] Dictator:   Tereso Newcomer, P.A.-C. CC:         Lewayne Bunting, M.D. [place], [place]  Fara Chute, M.D.   Discharge Summary  DATE OF BIRTH:  Aug 13, 1940  DISCHARGE DIAGNOSES: 1. Unstable angina, treated with a percutaneous transluminal coronary    angioplasty and stent to the left anterior descending coronary artery    this admission. 2. Coronary artery disease. 3. Status post subendocardial myocardial infarction, treated with a    stent to the right coronary artery on September 21, 2000. 4. Hyperlipidemia. 5. Gastroesophageal reflux disease.  PROCEDURES PERFORMED THIS ADMISSION: 1. Cardiac catheterization by Dr. Veneda Melter on November 04, 2000,    revealing left main, mild LAD, with two diagonals, 70% proximal    stenosis/60% midstenosis, and 50%-60% D-I stenosis.  Left circumflex    with two obtuse marginals, 30%-40% stenosis, RI with 60% proximal    stenosis, RCA with 30%40% midstenosis, and the old stent was patent.    No MR, ejection fraction greater than 79% on LV-gram. 2. PCI by Dr. Chales Abrahams on November 04, 2000, with a PTCA and stenting to the    proximal LAD, with a reduction in the stenosis from 70% to 0%.  HISTORY OF PRESENT ILLNESS:  This 70 year old female presented to the emergency room on November 02, 2000, with complaints of substernal chest pain that radiated to her back.  She felt this was similar to her preintervention symptoms in June 2002.  Her initial electrocardiogram revealed normal sinus rhythm without acute changes.  PHYSICAL EXAMINATION:  VITAL SIGNS:  Her blood pressure was 110/60, pulse 56.  NECK:  Without bruits or jugular venous distention.  CHEST:  Clear.  CARDIAC:  A regular rate and rhythm without murmurs.  ABDOMEN:  Nontender.  EXTREMITIES:  Without  edema.  HOSPITAL COURSE:  She was admitted to [facility].  She was continued on her medications, as well as heparin.  She ruled out for a myocardial infarction by enzymes.  Throughout the weekend she continued to complain of chest pain.  Around the time of admission she complained of pain that was worse with positional movements; however, on the morning of November 04, 2000, she was noting pain that she rated at a 4/10 that was worse with exertion.  Her pain was increased by walking down the hall.  It was felt at that time that she should probably be evaluated with a cardiac catheterization instead of stress testing.  She had had a rest Cardiolyte scan done with pain that was negative for ischemia.  She went for a cardiac catheterization on November 04, 2000.  This was performed by Dr. Chales Abrahams, as noted above.  He subsequently did a percutaneous coronary intervention, as noted above.  The patient tolerated the procedure well and had no immediate complications.  On the morning of November 05, 2000, she was found in stable condition, without any chest pain or shortness of breath.  Her electrocardiogram was normal with a heart rate of 69, normal sinus rhythm, no ST or T-wave changes.  DISPOSITION:  The patient was felt to be ready for discharge to home by Dr. Chales Abrahams as soon as her Integrilin/study drug  infusions were finished.  She was enrolled in the REPLACE-2 trial.  LABORATORY DATA:  White blood cell count 7000, hemoglobin 12, hematocrit 34.8, platelet count 211,000.  D-dimer less than 0.22.  Sodium 141, potassium 3.4, chloride 109, CO2 of 28, glucose 125, BUN 6, creatinine 0.7, calcium 8.4. Cardiac enzymes as noted above, as well as post-procedure enzymes on November 05, 2000:  #1 CPK 67, CPK-MB 5.6; #2 CPK 91, MB 11.2.  Urinalysis negative.  Fecal occult blood testing negative.  DISCHARGE MEDICATIONS: 1. Plavix 75 mg q.d. x 1 month. 2. Altace 10 mg q.d. (Please note this medication  dosage was increased.) 3. Protonix 40 mg q.d. 4. Premarin 0.625 mg q.d. 5. Lipitor 10 mg q.h.s. 6. Lopressor 25 mg q.12h. 7. Coated aspirin 325 mg q.d. 8. Nitroglycerin p.r.n. chest pain.  INSTRUCTIONS:  No driving, heavy lifting, or exertion for two days.  Return to work on Thursday, November 07, 2000.  DIET:  Low-fat, low-sodium diet.  WOUND CARE:  The patient is to call the office in [facility] for any groin swelling, bleeding, or bruising.  FOLLOWUP:  She is to see Dr. Lewayne Bunting in the [facility] in four to six weeks.  The office will call her with an appointment.  She is to see Dr. Fara Chute as needed.  DD:  11/05/00 TD:  11/05/00 Job: [id number]

## 2010-08-25 NOTE — Cardiovascular Report (Signed)
NAME:  Rhonda Garcia, Rhonda Garcia                         ACCOUNT NO.:  [id number]   MEDICAL RECORD NO.:  [id number]                   PATIENT TYPE:  INP   LOCATION:  2021                                 FACILITY:  [facility]   PHYSICIAN:  Veneda Melter, M.D.                   DATE OF BIRTH:  1940/10/30   DATE OF PROCEDURE:  11/30/2002  DATE OF DISCHARGE:                              CARDIAC CATHETERIZATION   PROCEDURES PERFORMED:  1. Left heart catheterization.  2. Left ventriculogram.  3. Selective coronary angiography.   DIAGNOSES:  1. Moderate three vessel coronary artery disease.  2. Normal left ventricular systolic function.   HISTORY:  [name] is a 70 year old white female with a history of  hypertension and coronary artery disease who has undergone percutaneous  intervention to the LAD and ramus intermedius branch.  The patient has done  well in the last two years.  However, this past weekend she had an episode  of sharp substernal chest discomfort with radiation to the jaw after walking  her dog.  This was unlike prior pain which tended to be dull in nature and  did not have radiation but she was concerned and presented to the emergency  room to rule out for acute myocardial infarction.  She did not have any  significant EKG changes.  She presents now for further cardiac assessment.   TECHNIQUE:  Informed consent was obtained.  The patient was brought to the  catheterization laboratory.  A 6-French sheath placed in the right femoral  artery using modified Seldinger technique.  A 6-French JL4 and JR4 catheter  was then used to engage the left and right coronary arteries.  Selective  angiography performed in various projections using manual injections of  contrast.  A 6-French pigtail catheter was advanced left ventricle and left  ventriculogram performed using power injections of contrast.  At the  termination of the case catheters and sheath were removed and manual  pressure applied  until adequate hemostasis achieved.  The patient tolerated  procedure well and was transferred to floor in stable condition.   FINDINGS:  1. Left main trunk:  Medium caliber vessel with diffuse disease of 20%.  2. LAD is a medium caliber vessel that provides a first diagonal branch in     the proximal segment.  The LAD has a previously placed stent in the     proximal segment that straddles the diagonal branch.  There is mild in-     stent restenosis of 30-40%.  Distal to this is a tubular narrowing of     50%.  The apical LAD has mild irregularities.  The first diagonal branch     has an ostial narrowing of 60%.  3. Left circumflex artery is a medium caliber vessel and provides two     marginal branches in the mid section.  The proximal AV circumflex has a  moderate narrowing of 60-70%.  The mid AV circumflex has diffuse disease     of 30-40%.  The marginal branches have disease of 30% as well.  4. Ramus intermedius:  This is a medium caliber vessel.  There is a     previously placed stent in the proximal segment with in-stent restenosis     of 50%.  5. Right coronary artery is dominant.  This is a small caliber vessel that     provides a small posterior descending artery and a posterior ventricular     branch in the terminal segment.  The right coronary has moderate disease     of 30% in the proximal segment.  There is a previously placed stent in     the proximal bend with mild in-stent restenosis of 10%.  There is an     eccentric plaque of 50% in the mid section near the takeoff of an RV     marginal branch.  The distal RCA has mild disease.  6. LV:  Normal end-systolic and end-diastolic dimensions.  Overall left     ventricular function is well preserved.  Ejection fraction greater than     55%.  No mitral regurgitation.  LV pressure is 180/10.  Aortic is 180/80.     LVEDP equals 20.   ASSESSMENT/PLAN:  [name] is a 70 year old female with moderate three  vessel coronary  disease.  She appears to have progression of disease  involving the proximal AV circumflex and ramus branch.  The left anterior  descending and right coronary arteries appear stable.  She has well  preserved left ventricular function.  Given the atypical nature of her pain  and the moderate nature of her disease without clear culprit lesion, she  will be treated medically and we will obtain a stress imaging study to  determine if she has any ischemia.  This may help identify a culprit lesion.  Otherwise, as noted, aggressive medical therapy with high dose Statins and  control of blood pressure will be pursued.  The patient has also been  counseled extensively regarding need for control of her diet and weight.                                               Veneda Melter, M.D.    Melton Alar  D:  11/30/2002  T:  11/30/2002  Job:  [id number]   cc:   Learta Codding, M.D.  [address]  [address]  [place]  [place] [address]   Colon Flattery, MD  [address]  [place]  [place] [address]  Fax: [phone]

## 2010-08-25 NOTE — Cardiovascular Report (Signed)
[place]  Patient:    Rhonda Garcia, TODARO Visit Number: [id number] MRN: [id number]          Service Type: MED Location: 3700 [phone] Attending Physician:  Rollene Rotunda Dictated by:   Veneda Melter, M.D. Proc. Date: 01/20/01 Admit Date:  01/19/2001   CC:         [facility]  Fara Chute, M.D.   Cardiac Catheterization  PROCEDURES PERFORMED: 1. Left heart catheterization. 2. Left ventriculogram. 3. Selective coronary angiography. 4. Percutaneous transluminal coronary angioplasty of the left anterior    descending and first diagonal branch. 5. Primary stent to the ramus intermedius branch.  DIAGNOSES: 1. Three-vessel coronary artery disease 2. Normal left ventricular systolic function. 3. Unstable angina.  INDICATIONS: The patient is a 70 year old, white female, with aggressive coronary artery disease, who presents with substernal chest discomfort, who has previously undergone percutaneous intervention to the right coronary artery, as well as the proximal mid LAD with placement of stents in June and July of 2002. The patient had done well in the interim, however, she now presents with crescendo angina. This began as exertional discomfort but now progressed to pain at rest with associated nausea, shortness of breath and palpitations. She was admitted to the hospital and subsequently ruled out for acute myocardial infarction and she presents now for further assessment.  TECHNIQUE: After informed consent was obtained, the patient was brought to the cardiac catheterization lab where a 6 French sheath was placed in the right femoral artery. Left heart catheterization and selective angiography were then performed in the usual fashion using preformed 6 French Judkins catheters.  FINDINGS: Initial findings are as follows: 1. Left main trunk: The left main trunk is a large caliber vessel with mild    irregularities of 20%. 2. LAD: This  is a medium caliber vessel that provides two diagonal branches.    The LAD has a stent in the proximal segment extending into the mid section    encompassing the first diagonal branch. There is moderate in-stent    re-stenosis of 50-60% with moderate disease of 50% following the stented    segment. The first diagonal branch has an ostial narrowing of 90%, the    second diagonal branch has mild irregularities. 3. Left circumflex artery: This is a medium caliber vessel that consists of    two marginal branches.  The AV circumflex has moderate disease of 30-40%    and there is a focal narrowing of 50% in the second marginal branch. 4. Ramus intermedius: This is a medium caliber vessel that provides the    anterolateral wall. There is a focal narrowing of at least 70% in the    proximal segment. Mild irregularities are seen in the remainder of the    vessel. 5. Right coronary: Dominant. This is a medium caliber vessel that provides the    posterior descending artery and two posterior ventricular branches in its    terminal segment. There is mild disease in the proximal segment of 30%    within the area of previous stenting. Moderate disease is noted in the    mid section of 40-50% prior to an RV marginal branch. Mild disease of    30-40% is noted in the distal RCA prior to the branches.  LEFT VENTRICULOGRAM: Normal end-systolic and end-diastolic dimensions. Overall left ventricular function is well preserved, ejection fraction of greater than 55%.  No mitral regurgitation. LV pressure is 170/10, aortic is 170/80, LVEDP equals  15.  With these findings, we elected to proceed with percutaneous intervention to the LAD, first diagonal branch and ramus intermedius. The 6 French sheath was exchanged for a 7 [place] sheath and 7 [place] JL4 guide catheter was used to engage the left coronary artery. Selective guide shots were obtained.  The patient was given 300 mg of Plavix orally and heparin and  Integrilin on a weight-adjusted basis to maintain an ACT of greater than 225 seconds. A 0.014 inch Patriot wire was advanced into the first diagonal branch and a 2.5 x 9 mm Maverick balloon introduced. Two inflations were performed at 6 atmospheres for 30 seconds and 8 atmospheres for 60 seconds with repeat angiography showing significant improvement in vessel lumen. The patient did have chest discomfort during balloon inflations consistent with pain on presentation. This resolved with balloon deflations. A second Patriot wire was then introduced and positioned in the ramus intermedius branch.  A 2.75 x 12 mm Express II stent was then used and primarily deployed in the proximal segment of the vessel at 16 atmospheres for 60 seconds.  Repeat angiography showed an excellent result with full coverage of the lesion and no residual stenosis. This Patriot wire was then repositioned in the LAD and a 3.25 x 15 mm Cutting Balloon introduced. Two inflations were performed within the mid section of the vessel starting just distal to the stented segment at 6 atmospheres for 30 seconds and two inflations in the proximal segment within the stented vessel at 10 atmospheres for 30 seconds.  Repeat angiography showed an excellent result with significant improvement in vessel lumen, less than 10% residual narrowing. In the proximal and mid LAD there was mild residual disease of perhaps 40-50% in the mid section of the vessel beyond the area that was intervened on. However, this was noncritical and we elected to leave this alone.  The diagonal branch had a residual pinch of 30-50%, however, there was TIMI grade 3 flow through this vessel. The intermediate as well had TIMI grade 3 flow. There was no evidence of vessel damage. Final angiography was then performed in various projections confirming these findings. The guide catheter  was then removed and sheath secured in position. The patient tolerated  the procedure well and was transferred to the ward in stable condition.  FINAL RESULTS: 1. Successful percutaneous transluminal coronary angioplasty of the ostium    of the first diagonal branch with reduction of 90% narrowing to less than    50%. 2. Successful percutaneous transluminal coronary angioplasty of the proximal    mid left anterior descending with reduction of 60% in-stent re-stenosis to    less than 10%. 3. Successful primary stenting of the ramus intermedius with reduction of 70%    proximal narrowing to 0% with placement of a 2.75 x 12 mm Express II stent    dilated to 3 mm. Dictated by:   Veneda Melter, M.D. Attending Physician:  Rollene Rotunda DD:  01/20/01 TD:  01/21/01 Job: [id number]

## 2010-08-25 NOTE — Discharge Summary (Signed)
[name], [name] NO.:  [id number]   MEDICAL RECORD NO.:  [id number]          PATIENT TYPE:  INP   LOCATION:  4704                         FACILITY:  [facility]   PHYSICIAN:  Joellyn Rued, PA-C     DATE OF BIRTH:  June 14, 1940   DATE OF ADMISSION:  08/23/2005  DATE OF DISCHARGE:  08/24/2005                           DISCHARGE SUMMARY - REFERRING   SUMMARY OF HISTORY:  [name] is a 70 year old female who presented to  [facility] and admitted by Dr. Olena Leatherwood with substernal chest  discomfort that occurred both at rest and with exertion.  She also stated  that it radiated down the left arm and into left neck associated with  shortness of breath and diaphoresis.  She has not been taking any of her  medications over the preceding 4 months.  She was admitted to [facility] for further evaluation and transferred to [facility] for cardiac  catheterization.   PAST MEDICAL HISTORY:  Notable for remote tobacco use, breast reduction  surgery, cholecystectomy, hysterectomy, known coronary artery disease with  multiple prior interventions dating back to 2002.  Last stress test in 2006  was negative for ischemia.  She also has a history of dyslipidemia and GERD.   LABORATORY DATA:  [facility]  EKG showed sinus bradycardia left axis  deviation, no acute changes.  Chest x-ray did not show any active disease.  At [facility] she had ruled out for myocardial infarction.   HOSPITAL COURSE:  [name] was transferred to [facility] for  cardiac catheterization.  This was performed on 08/24/05 by Dr. Gala Romney  and revealed three-vessel nonobstructive coronary artery disease with a  stent in the proximal LAD that was patent.  She had EF of 65% without wall  motion abnormalities.  Two renals bilaterally which were also patent.  There  was noted some moderate plaquing in the distal aorta prior to iliac. Dr.  Gala Romney recommended medical treatment and she was  discharged home post bed  rest.   DISCHARGE DIAGNOSIS:  Chest discomfort of uncertain etiology, medication  noncompliance, recent tobacco cessation and history of mild hyperglycemia  without history of diabetes, history is noted below.           ______________________________  Joellyn Rued, PA-C     EW/MEDQ  D:  12/24/2005  T:  12/24/2005  Job:  [id number]

## 2010-08-25 NOTE — Op Note (Signed)
NAME:  Rhonda Garcia, Rhonda Garcia                         ACCOUNT NO.:  [id number]   MEDICAL RECORD NO.:  [id number]                   PATIENT TYPE:  AMB   LOCATION:  DSC                                  FACILITY:  [facility]   PHYSICIAN:  Rodney A. Chaney Malling, M.D.           DATE OF BIRTH:  Jan 08, 1941   DATE OF PROCEDURE:  07/29/2003  DATE OF DISCHARGE:                                 OPERATIVE REPORT   PREOPERATIVE DIAGNOSIS:  Tear of medial lateral meniscus, left knee.   POSTOPERATIVE DIAGNOSIS:  Extensive tearing of anterior and posterior horns  lateral meniscus left knee; large chondral lesion medial femoral condyle  left knee.   PROCEDURE:  Arthroscopy, subtotal lateral meniscectomy, chondroplasty medial  femoral condyle left knee and removal of cartilaginous loose bodies.   SURGEON:  Lenard [name]. Chaney Malling, M.D.   ANESTHESIA:  MAC.   PATHOLOGY:  With the arthroscope, a very careful examination of the knee was  undertaken.  The patellofemoral joint was visualized first.  A little damage  was noted.  There were some early cartilage changes in the trochlea area of  the notch.  The anterior cruciate ligament was normal.  The anterior aspect  of the lateral joint line was totally obscured by large tear anterior horn  which showed displaced into the notch area.  There were also tears of the  posterior third lateral meniscus.  Very little cartilage damage to the  lateral femoral condyle or lateral tibial plateau.  The arthroscope was then  passed into the medial compartment.  The entire circumference of the medial  meniscus was seen and palpated.  This was absolutely normal.  But there was  large area of torn loose cartilage off the weightbearing area of the medial  femoral condyle.  This could be easily elevated with a probe.   DESCRIPTION OF PROCEDURE:  The patient was placed on the operating table in  the supine position with a pneumatic tourniquet about the left upper thigh.  The left leg was  placed in the leg holder.  The entire lower extremity was  prepped with Duraprep and draped in the usual sterile fashion.  Marcaine was  placed in the knee and Xylocaine with epinephrine used to infiltrate the  puncture wounds.  The infusion cannula was placed in the superior medial  pouch and the knee was distended with saline.  Anterior medial and anterior  lateral portals were made and the arthroscope was introduced.  Attention was  first turned to the intercondylar notch area.  The anterior notch area was  obscured by torn anterior horn of lateral meniscus and was debrided  aggressively with the intra-articular shaver.  There was a loose body in  this area.  The anterior cruciate ligament was normal once this was  visualized.  The anterior horn was then debrided and the torn portion was  removed.  A series of baskets were within certain posterior third of the  lateral meniscus was also torn.  This was a complex tear of the lateral  meniscus and was debrided with a series of baskets followed by the internal  tip of the shaver.  All loose material was then removed.  The remainder was  then smooth and balanced from posterior third to the anterior third.  Attention was then turned to the medial compartment.  There was a large area  of torn articular cartilage off the medial femoral condyle and this debrided  with chondroplasty shaver.  In this area about 80% articular cartilage was  loose and was debrided.  The underlying bone was still covered by a thin  layer of hyaline cartilage.  Loose bodies were removed.  The knee was then  filled  with Marcaine and a large bulky pressure dressing applied.  The patient then  returned to the recovery room in excellent condition.  This procedure went  extremely well.   FOLLOW UP:  Follow up in my office on Wednesday.  Percocet for pain.                                               Rodney A. Chaney Malling, M.D.    RAM/MEDQ  D:  07/29/2003  T:   07/30/2003  Job:  [id number]

## 2010-08-25 NOTE — Procedures (Signed)
   NAME:  KENZINGTON, MIELKE                         ACCOUNT NO.:  [id number]   MEDICAL RECORD NO.:  [id number]                   PATIENT TYPE:  AMB   LOCATION:  DAY                                  FACILITY:  [facility]   PHYSICIAN:  Vida Roller, M.D.                DATE OF BIRTH:  November 12, 1940   DATE OF PROCEDURE:  DATE OF DISCHARGE:                                EKG INTERPRETATION   REFERRING PHYSICIAN:  Bernerd Limbo. Leona Carry, M.D.   FINDINGS:  Electrocardiogram obtained on October 26, 2002 at 1039 shows sinus  bradycardia at a rate of 56 with normal axes, normal intervals, no evidence  of ST-T wave changes concerning for ischemia and no Q waves concerning for  an old myocardial infarction.  Essentially normal EKG.                                               Vida Roller, M.D.    JH/MEDQ  D:  10/26/2002  T:  10/27/2002  Job:  [id number]

## 2010-08-25 NOTE — Discharge Summary (Signed)
[place]. [facility]  Patient:    Rhonda Garcia, Rhonda Garcia Visit Number: [id number] MRN: [id number]          Service Type: MED Location: [phone] Attending Physician:  Nathen May Dictated by:   Pennelope Bracken, N.P. Admit Date:  04/19/2001 Discharge Date: 04/22/2001   CC:         Dr. Aretta Nip, [place]   Discharge Summary  DATE OF BIRTH:  [birth year]  REASON FOR ADMISSION:  Chest pain.  DISCHARGE DIAGNOSES: 1. Chest pain, etiology unclear in this patient with history of coronary    artery disease with subsequent in-stent restenosis as well as    gastroesophageal reflux disease and Statin induced myalgias. 2. Coronary artery disease with stents to right coronary artery performed in    June 2002, with stent to left anterior descending in July 2002, stent to    ramus in October 2002, with multiple angioplasties. 3. Normal left ventricular function. 4. Gastroesophageal reflux disease. 5. Hypertension. 6. Hyperlipidemia.  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  This was accompanied by some nausea and diaphoresis and did not respond to a change in position.  She did obtain some relief with use of sublingual nitroglycerin. She elected to receive further evaluation for this since it matched the prodrome experienced before other cardiac interventions were performed.  She was brought to [facility] Emergency Department and stabilized for further evaluation and treatment.  HOSPITAL COURSE:  The patient was heparinized and placed on telemetry monitoring and serial labs were drawn.  Her EKG was within normal limits and her cardiac enzymes gave no evidence of recent injury.  Coagulation studies were negative for embolus.  She was scheduled for a stress Cardiolite based  on these findings with catheterization planned if positive.  The patients Cardiolite revealed a fixed perfusion defect only which may be attributable to breast attenuation.  No reversible perfusion defect was seen. Ejection fraction was estimated at 76%.  The patient had an uneventful hospital course with resolution of her chest pain over the next two days of admission.  DISCHARGE PHYSICAL EXAMINATION:  GENERAL:  The patient offered no complaints of shortness of breath, chest pain or nausea and vomiting.  VITAL SIGNS:  Blood pressure 140/70, heart rate 82, temperature 36.1 Celsius. Telemetry revealed a normal sinus rhythm.  HEENT:  Without JVD.  LUNGS:  Clear to auscultation bilaterally.  CARDIAC:  Regular rate and rhythm.  EXTREMITIES:  Without clubbing, cyanosis, or edema.  LABORATORY DATA AND X-RAY FINDINGS:  On day of discharge, hematology was as follows:  WBC 5.2, hemoglobin 12.7, hematocrit 37.5, platelets 170.  D-dimer obtained on admission was within normal limits at 0.22.  BNP was 56.2.  C-reactive protein 0.6.  As mentioned above, TSH was 2.58.  CK 101, CK-MB1.5, troponin I 0.01.  Liver panel was within normal limits with an AST of 26, ALT 27, Alk phos 87 and total bilirubin of 0.8.  Chemistries with sodium 142, potassium 4.0, chloride 109, CO2 28, glucose 82, BUN 11, creatinine 0.7, calcium 9.5.  Chest x-ray revealed heart size and mediastinal contours within normal limits with clear lungs.  DISPOSITION:  Discharged to home in the care of her husband.  DISCHARGE MEDICATIONS: 1. Pravachol 40 mg one q.d. 2. Imdur 30 mg one q.d. 3. Nitroglycerin 0.4  mg SL one every five minutes x3 p.r.n. chest pain. 4. Altace 10 mg q.d. 5. Lopressor 50 mg b.i.d. 6. Plavix 75 mg q.d.  The patient agrees to consider enrolling in the [employer]    Trial in [place], so this may be changed. 7. Protonix 40 mg q.d. 8. Foltx one q.d.  DIET:  Low fat diet.  FOLLOWUP:  The patient  agrees to call Dr. Neita Carp for follow-up appointment and to phone the [facility] clinic to follow up with Dr. Andee Lineman in two weeks time.  She does admit some reservation about doing this since she is uninsured and will not qualify for Disability until the Spring.  The importance of the visit and the possibility of making incremental payments is discussed with her and she agrees to acknowledge the importance of her followup visit. Dictated by:   Pennelope Bracken, N.P. Attending Physician:  Nathen May DD:  04/22/01 TD:  04/22/01 Job: [id number]

## 2010-08-25 NOTE — Cardiovascular Report (Signed)
[place]. [facility]  Patient:    Rhonda Garcia, Rhonda Garcia Visit Number: [id number] MRN: [id number]          Service Type: MED Location: [phone] Attending Physician:  Learta Codding Proc. Date: 11/04/00 Admit Date:  11/02/2000   CC:         Fara Chute, M.D.  Lewayne Bunting, M.D.   Cardiac Catheterization  PROCEDURE: 1. Left heart catheterization 2. Left ventriculogram. 3. Selective coronary angiography. 4. Percutaneous transluminal coronary angioplasty and stenting of the    proximal and mid left anterior descending artery.  DIAGNOSES: 1. Three-vessel coronary artery disease. 2. Normal left ventricular systolic function.  CARDIOLOGIST:  Veneda Melter, M.D.  HISTORY:  [name] is a 70 year old white female, with multiple cardiac risk factors, who presents with substernal chest discomfort.  The patient has previously suffered a myocardial infarction on September 21, 2000, and had stent placement to the mid LAD for a subtotal occlusion. At that time, she was found to have a significant lesion in the LAD which was treated medically. Unfortunately, she has had recurrence of discomfort, and she presents to the hospital.  She was admitted and subsequently ruled out for acute myocardial infarction, and she presents for further cardiac assessment.  TECHNIQUE:  After informed consent was obtained, the patient was brought to the catheterization lab.  A 6-French sheath was placed in the left femoral artery.  Left heart catheterization and selective coronary angiography was then performed in the usual fashion using preformed 6-French Judkins catheters.  INITIAL FINDINGS: 1. Left main trunk: Mild irregularities. 2. LAD.  This is a medium caliber vessel that provides a first diagonal    branch in the proximal segment, a small second diagonal branch in the mid    section.  The LAD has an eccentric narrowing of 70% in the proximal    segment with diffuse disease  extending into and encompassing the first    diagonal branch.  There is a further narrowing of 60% immediately following    the diagonal branch.  The mid section of the vessel then has moderate    diffuse disease of 30 to 40% with mild irregularities in the apical    segment.  The first diagonal branch has an ostial narrowing of 50 to 60%.    The second diagonal branch is a trivial vessel with mild irregularities. 3. Left circumflex artery: This is a medium caliber vessel that provides two    marginal branches.  The A-V circumflex has diffuse disease of 30 to 40%.    There is focal narrowing of 40 to 50% in the proximal segment of the second    marginal branch. 4. The ramus intermedius is a large caliber vessel with proximal narrowing    of 50 to 60%. 5. The right coronary artery is dominant.  It is a medium caliber vessel    that provides a posterior descending artery and two posterior ventricular    branches in the terminal segment.  There is diffuse disease of 30 to 40%    in the mid section of the right coronary artery.  An old stent is noted in    the mid section near the proximal bend and is widely patent. 6. LV: Normal systolic and end-diastolic dimensions.  Overall left ventricular    function is well preserved.  Ejection fraction greater than 70%.  No mitral    regurgitation.  LV pressure is 190/10, aortic pressure 190/95.  LVEDP = 22.  These findings were reviewed with the patient, and treatment options discussed including bypass surgery versus percutaneous therapy.  She wished to proceed with percutaneous intervention.  PERCUTANEOUS INTERVENTION:  The 6-French sheath was exchanged for a 7-French sheath.  The patient was enrolled in REPLACE II study and randomized per protocol.  She was given Plavix 300 mg orally.  A 7-French Q4 guide catheter was used to engage the left coronary artery, and a 0.014 inch high-torque floppy wire advanced into the distal LAD.  A short extra  support wire was advanced in the first diagonal branch for protection.  A 3.0 x 10 mm cutting balloon was then introduced.  Several inflations were performed in the mid LAD at 6 and then 10 atmospheres for 30 seconds.  A total of four inflations were then performed in the proximal LAD at 10 atmospheres for 30 seconds.  Repeat angiography showed improvement of the lesion in the mid section of the vessel. There was only mild improvement in the proximal lesion with 50 to 60% residual eccentric stenosis noted, and a compromise of the diagonal branch was noted; in fact, flow appeared to be somewhat improved.  We thus elected to proceed with stenting of the proximal LAD, and a 3.0 x 24 mm AVE S7 stent was introduced.  This was carefully positioned in the proximal LAD extending beyond the first diagonal branch into the lesion in the mid section of the vessel.  This was deployed at 12 atmospheres for 30 seconds.  Repeat angiography after administration of intracoronary nitroglycerin showed an excellent result with no residual stenosis and no compromise of the diagonal branch.   There was moderate residual disease in the mid section immediately following the stent of 30 to 40%; however, this was not considered flow limiting.  Thus, we elected to treat this medically.  Final angiography was performed in various projections showing no distal vessel damage and TIMI-3 flow through the LAD and diagonal branch.  The guide catheter was then removed and the sheath secured into position.  The patient did have chest discomfort and pressure reminiscent of her pain on presentation.  She was then transferred to the floor in stable condition.  FINAL RESULTS:  Successful PTCA and stenting of the proximal mid LAD with reduction of sequential 70 and 60% narrowings to 0% with placement of a 3.0 x 24 mm AVE S7. Attending Physician:  Learta Codding DD:  11/04/00 TD:  11/05/00 Job: [id number]

## 2010-08-25 NOTE — Op Note (Signed)
NAME:  ANAH, BILLARD                         ACCOUNT NO.:  [id number]   MEDICAL RECORD NO.:  [id number]                   PATIENT TYPE:  AMB   LOCATION:  DAY                                  FACILITY:  [facility]   PHYSICIAN:  Bernerd Limbo. Leona Carry, M.D.             DATE OF BIRTH:  May 03, 1940   DATE OF PROCEDURE:  10/28/2002  DATE OF DISCHARGE:                                 OPERATIVE REPORT   PREOPERATIVE DIAGNOSIS:  Chronic cholecystitis.   POSTOPERATIVE DIAGNOSIS:  Chronic cholecystitis.   PROCEDURE:  Laparoscopic cholecystectomy.   COMPLICATIONS:  None.   SURGEON:  Bernerd Limbo. Leona Carry, M.D.   ASSISTANT:  Dalia Heading, M.D.   DESCRIPTION OF PROCEDURE:  Under adequate general anesthesia, the patient  was prepped and draped in the usual manner.   A small incision was made above the umbilicus, and through this opening, a  10-mm trocar with the Visiport attached was introduced into the peritoneal  cavity.  There was noted to be a marked amount of adhesions, and it was with  some difficulty that we were able to get to the point where we could  visualize the area of the liver and the gallbladder.  A small incision was  made about 2 cm below and right of the xyphoid.  Through this opening, a 10-  mm trocar was inserted.  Through this port, the dissecting instrument was  inserted.  Two small incisions were made on the right, the first about 2 cm  below and the second 2 to 3 cm inferior and lateral.  Through these opening,  5-mm trocars were inserted.  Through the 5-mm ports, the retractor forceps  were inserted.  It should be noted that we made a mid upper abdominal  incision, and another 10-mm trocar was inserted.  Through this, retracting  pack was inserted, pushing the fat out of the way.  By means of blunt  dissection, the cystic duct was identified, mobilized, triply clipped  distally, singly clipped proximally, and transected.  The cystic artery was  identified, mobilized,  doubly clipped distally, singly clipped proximally,  and transected.  Utilizing electrocautery, the gallbladder was then removed  from below upwards with minimal difficulty.  On completion of this portion  of the procedure, all bleeders were controlled.  After examining the  operative site, the instruments were removed and the wounds we closed in the  following manner.  The fascial layers were closed with interrupted 0 Vicryl,  and the skin and subcutaneous tissue were closed with skin clips.  Steri-  Strips, Telfa, and OpSite dressings were applied.   The patient tolerated the procedure nicely and left in good condition.  Bernerd Limbo. Leona Carry, M.D.   NMD/MEDQ  D:  10/28/2002  T:  10/28/2002  Job:  [id number]

## 2010-09-06 ENCOUNTER — Encounter: Payer: Self-pay | Admitting: Physician Assistant

## 2010-09-21 ENCOUNTER — Emergency Department (HOSPITAL_COMMUNITY)
Admission: EM | Admit: 2010-09-21 | Discharge: 2010-09-21 | Disposition: A | Discharge status: Home / Self Care | Payer: Medicare PPO | Attending: Emergency Medicine | Admitting: Emergency Medicine

## 2010-09-21 ENCOUNTER — Emergency Department (HOSPITAL_COMMUNITY): Payer: Medicare PPO

## 2010-09-21 DIAGNOSIS — R112 Nausea with vomiting, unspecified: Secondary | ICD-10-CM | POA: Insufficient documentation

## 2010-09-21 DIAGNOSIS — K219 Gastro-esophageal reflux disease without esophagitis: Secondary | ICD-10-CM | POA: Insufficient documentation

## 2010-09-21 DIAGNOSIS — R109 Unspecified abdominal pain: Secondary | ICD-10-CM | POA: Insufficient documentation

## 2010-09-21 DIAGNOSIS — R630 Anorexia: Secondary | ICD-10-CM | POA: Insufficient documentation

## 2010-09-21 DIAGNOSIS — Z79899 Other long term (current) drug therapy: Secondary | ICD-10-CM | POA: Insufficient documentation

## 2010-09-21 DIAGNOSIS — E7800 Pure hypercholesterolemia, unspecified: Secondary | ICD-10-CM | POA: Insufficient documentation

## 2010-09-21 DIAGNOSIS — R10819 Abdominal tenderness, unspecified site: Secondary | ICD-10-CM | POA: Insufficient documentation

## 2010-09-21 DIAGNOSIS — K5289 Other specified noninfective gastroenteritis and colitis: Secondary | ICD-10-CM | POA: Insufficient documentation

## 2010-09-21 DIAGNOSIS — I10 Essential (primary) hypertension: Secondary | ICD-10-CM | POA: Insufficient documentation

## 2010-09-21 DIAGNOSIS — M129 Arthropathy, unspecified: Secondary | ICD-10-CM | POA: Insufficient documentation

## 2010-09-21 DIAGNOSIS — E119 Type 2 diabetes mellitus without complications: Secondary | ICD-10-CM | POA: Insufficient documentation

## 2010-09-21 LAB — COMPREHENSIVE METABOLIC PANEL
ALT: 21 U/L (ref 0–35)
AST: 20 U/L (ref 0–37)
Albumin: 3.5 g/dL (ref 3.5–5.2)
Alkaline Phosphatase: 76 U/L (ref 39–117)
BUN: 9 mg/dL (ref 6–23)
CO2: 27 mEq/L (ref 19–32)
Calcium: 9.4 mg/dL (ref 8.4–10.5)
Chloride: 106 mEq/L (ref 96–112)
Creatinine, Ser: 0.56 mg/dL (ref 0.4–1.2)
GFR calc Af Amer: >60 mL/min (ref >60)
GFR calc non Af Amer: >60 mL/min (ref >60)
Glucose, Bld: 130 mg/dL — ABNORMAL HIGH (ref 70–99)
Potassium: 3.6 mEq/L (ref 3.5–5.1)
Sodium: 141 mEq/L (ref 135–145)
Total Bilirubin: 0.5 mg/dL (ref 0.3–1.2)
Total Protein: 6.5 g/dL (ref 6.0–8.3)

## 2010-09-21 LAB — LIPASE, BLOOD: Lipase: 11 U/L (ref 11–59)

## 2010-09-21 LAB — URINALYSIS, ROUTINE W REFLEX MICROSCOPIC
Glucose, UA: NEGATIVE mg/dL
Hgb urine dipstick: NEGATIVE
Ketones, ur: 15 mg/dL — AB
Nitrite: NEGATIVE
Protein, ur: NEGATIVE mg/dL
Specific Gravity, Urine: 1.031 — ABNORMAL HIGH (ref 1.005–1.030)
Urobilinogen, UA: 1 mg/dL (ref 0.0–1.0)
pH: 5.5 (ref 5.0–8.0)

## 2010-09-21 LAB — CBC
HCT: 42.1 % (ref 36.0–46.0)
Hemoglobin: 14.7 g/dL (ref 12.0–15.0)
MCH: 30.6 pg (ref 26.0–34.0)
MCHC: 34.9 g/dL (ref 30.0–36.0)
MCV: 87.7 fL (ref 78.0–100.0)
Platelets: 178 10*3/uL (ref 150–400)
RBC: 4.8 MIL/uL (ref 3.87–5.11)
RDW: 13.5 % (ref 11.5–15.5)
WBC: 5.7 10*3/uL (ref 4.0–10.5)

## 2010-09-21 LAB — DIFFERENTIAL
Basophils Absolute: 0 10*3/uL (ref 0.0–0.1)
Basophils Relative: 0 % (ref 0–1)
Eosinophils Absolute: 0.2 10*3/uL (ref 0.0–0.7)
Eosinophils Relative: 3 % (ref 0–5)
Lymphocytes Relative: 26 % (ref 12–46)
Lymphs Abs: 1.5 10*3/uL (ref 0.7–4.0)
Monocytes Absolute: 0.7 10*3/uL (ref 0.1–1.0)
Monocytes Relative: 12 % (ref 3–12)
Neutro Abs: 3.3 10*3/uL (ref 1.7–7.7)
Neutrophils Relative %: 58 % (ref 43–77)

## 2010-09-21 LAB — URINE MICROSCOPIC-ADD ON

## 2010-09-21 LAB — OCCULT BLOOD, POC DEVICE: Fecal Occult Bld: NEGATIVE

## 2010-09-21 MED ORDER — IOHEXOL 300 MG/ML  SOLN
80.0000 mL | Freq: Once | INTRAMUSCULAR | Status: AC | PRN
  Administered 2010-09-21: 80 mL via INTRAVENOUS

## 2010-10-05 ENCOUNTER — Encounter: Payer: Self-pay | Admitting: Cardiology

## 2011-01-01 LAB — OVA AND PARASITE EXAMINATION: Ova and parasites: NONE SEEN

## 2011-01-01 LAB — CLOSTRIDIUM DIFFICILE EIA: C difficile Toxins A+B, EIA: NEGATIVE

## 2011-01-01 LAB — STOOL CULTURE

## 2011-01-01 LAB — FECAL LACTOFERRIN, QUANT: Fecal Lactoferrin: NEGATIVE

## 2011-01-22 ENCOUNTER — Other Ambulatory Visit: Payer: Self-pay | Admitting: Family Medicine

## 2011-01-22 DIAGNOSIS — Z139 Encounter for screening, unspecified: Secondary | ICD-10-CM

## 2011-01-25 ENCOUNTER — Ambulatory Visit (HOSPITAL_COMMUNITY): Payer: Medicare PPO

## 2011-01-30 ENCOUNTER — Ambulatory Visit (HOSPITAL_COMMUNITY)
Admission: RE | Admit: 2011-01-30 | Discharge: 2011-01-30 | Disposition: A | Discharge status: Home / Self Care | Payer: Medicare PPO | Source: Ambulatory Visit | Attending: Family Medicine | Admitting: Family Medicine

## 2011-01-30 DIAGNOSIS — Z139 Encounter for screening, unspecified: Secondary | ICD-10-CM

## 2011-01-30 DIAGNOSIS — Z1231 Encounter for screening mammogram for malignant neoplasm of breast: Secondary | ICD-10-CM | POA: Insufficient documentation

## 2011-02-19 LAB — CBC WITH DIFFERENTIAL/PLATELET
HCT: 39 %
Hemoglobin: 13.1 g/dL (ref 12.0–16.0)
MCV: 90.9 fL
WBC: 5.1
platelet count: 154

## 2011-02-19 LAB — HAPTOGLOBIN
ALT: 17 U/L (ref 7–35)
AST: 18 U/L
Albumin: 3.9
Alkaline Phosphatase: 85 U/L
Calcium: 9.1 mg/dL
Creat: 0.62
Glucose: 162 mg/dL
Potassium: 3.5 mmol/L
Sodium: 141 mmol/L (ref 137–147)
Total Bilirubin: 0.5 mg/dL

## 2011-02-20 DIAGNOSIS — R079 Chest pain, unspecified: Secondary | ICD-10-CM

## 2011-02-28 ENCOUNTER — Ambulatory Visit (INDEPENDENT_AMBULATORY_CARE_PROVIDER_SITE_OTHER): Payer: Medicare PPO | Admitting: Gastroenterology

## 2011-02-28 ENCOUNTER — Encounter: Payer: Self-pay | Admitting: Gastroenterology

## 2011-02-28 VITALS — BP 108/63 | HR 73 | Temp 97.4°F | Ht 62.0 in | Wt 179.8 lb

## 2011-02-28 DIAGNOSIS — R1310 Dysphagia, unspecified: Secondary | ICD-10-CM | POA: Insufficient documentation

## 2011-02-28 DIAGNOSIS — R1314 Dysphagia, pharyngoesophageal phase: Secondary | ICD-10-CM

## 2011-02-28 DIAGNOSIS — R1319 Other dysphagia: Secondary | ICD-10-CM | POA: Insufficient documentation

## 2011-02-28 DIAGNOSIS — R194 Change in bowel habit: Secondary | ICD-10-CM | POA: Insufficient documentation

## 2011-02-28 DIAGNOSIS — R634 Abnormal weight loss: Secondary | ICD-10-CM

## 2011-02-28 DIAGNOSIS — R1013 Epigastric pain: Secondary | ICD-10-CM

## 2011-02-28 DIAGNOSIS — R198 Other specified symptoms and signs involving the digestive system and abdomen: Secondary | ICD-10-CM

## 2011-02-28 DIAGNOSIS — K5900 Constipation, unspecified: Secondary | ICD-10-CM

## 2011-02-28 DIAGNOSIS — R6881 Early satiety: Secondary | ICD-10-CM | POA: Insufficient documentation

## 2011-02-28 DIAGNOSIS — K219 Gastro-esophageal reflux disease without esophagitis: Secondary | ICD-10-CM | POA: Insufficient documentation

## 2011-02-28 MED ORDER — POLYETHYLENE GLYCOL 3350 17 GM/SCOOP PO POWD
ORAL | Status: DC
Start: 2011-02-28 — End: 2011-12-31

## 2011-02-28 MED ORDER — DEXLANSOPRAZOLE 60 MG PO CPDR: 60.0000 mg | DELAYED_RELEASE_CAPSULE | Freq: Every day | ORAL | Status: DC

## 2011-02-28 NOTE — Assessment & Plan Note (Signed)
Bowel habit changes since April 2012. History of chronic diarrhea previously. Now she requires laxatives nor have a bowel movement, requiring multiple at a time. Family history significant for colon cancer, sister, advanced age. Interestingly, she had a CT scan in June which showed some distal ileal wall thickening. At that time she states she was having constipation. Recommend colonoscopy for further evaluation of symptoms. Consider terminal ileoscopy at the same time.  I have discussed the risks, alternatives, benefits with regards to but not limited to the risk of reaction to medication, bleeding, infection, perforation and the patient is agreeable to proceed. Written consent to be obtained.  Start MiraLax 17 g 3 times a day for 3 days, then daily at bedtime on days she does not have a bowel movement. She may continue to use Amitiza as needed, she is unable to afford to take it twice daily however. Increase dietary fiber constipation instructions provided.

## 2011-02-28 NOTE — Patient Instructions (Signed)
We have scheduled you for an upper endoscopy and colonoscopy. Please see separate instructions. Please stop Prilosec. Try Dexilant 60mg  po daily. We have provided you with samples.  Please take MiraLax 17 g, 3 times a day for the next 3 days. Then I would recommend you take one dose at bedtime on days you do not have a bowel movement. You may continue the Amitiza when necessary.  Diet for GERD or PUD Nutrition therapy can help ease the discomfort of gastroesophageal reflux disease (GERD) and peptic ulcer disease (PUD).  HOME CARE INSTRUCTIONS   Eat your meals slowly, in a relaxed setting.   Eat 5 to 6 small meals per day.   If a food causes distress, stop eating it for a period of time.  FOODS TO AVOID  Coffee, regular or decaffeinated.   Cola beverages, regular or low calorie.   Tea, regular or decaffeinated.   Pepper.   Cocoa.   High fat foods, including meats.   Butter, margarine, hydrogenated oil (trans fats).   Peppermint or spearmint (if you have GERD).   Fruits and vegetables if not tolerated.   Alcohol.   Nicotine (smoking or chewing). This is one of the most potent stimulants to acid production in the gastrointestinal tract.   Any food that seems to aggravate your condition.  If you have questions regarding your diet, ask your caregiver or a registered dietitian. TIPS  Lying flat may make symptoms worse. Keep the head of your bed raised 6 to 9 inches (15 to 23 cm) by using a foam wedge or blocks under the legs of the bed.   Do not lay down until 3 hours after eating a meal.   Daily physical activity may help reduce symptoms.  MAKE SURE YOU:   Understand these instructions.   Will watch your condition.   Will get help right away if you are not doing well or get worse.  Document Released: 03/26/2005 Document Revised: 12/06/2010 Document Reviewed: 08/09/2008 [facility] Patient Information 2012 [place], [place].  Constipation in Adults Constipation is having  fewer than 2 bowel movements per week. Usually, the stools are hard. As we grow older, constipation is more common. If you try to fix constipation with laxatives, the problem may get worse. This is because laxatives taken over a long period of time make the colon muscles weaker. A low-fiber diet, not taking in enough fluids, and taking some medicines may make these problems worse. MEDICATIONS THAT MAY CAUSE CONSTIPATION  Water pills (diuretics).   Calcium channel blockers (used to control blood pressure and for the heart).   Certain pain medicines (narcotics).   Anticholinergics.   Anti-inflammatory agents.   Antacids that contain aluminum.  DISEASES THAT CONTRIBUTE TO CONSTIPATION  Diabetes.   Parkinson's disease.   Dementia.   Stroke.   Depression.   Illnesses that cause problems with salt and water metabolism.  HOME CARE INSTRUCTIONS   Constipation is usually best cared for without medicines. Increasing dietary fiber and eating more fruits and vegetables is the best way to manage constipation.   Slowly increase fiber intake to 25 to 38 grams per day. Whole grains, fruits, vegetables, and legumes are good sources of fiber. A dietitian can further help you incorporate high-fiber foods into your diet.   Drink enough water and fluids to keep your urine clear or pale yellow.   A fiber supplement may be added to your diet if you cannot get enough fiber from foods.   Increasing your activities also  helps improve regularity.   Suppositories, as suggested by your caregiver, will also help. If you are using antacids, such as aluminum or calcium containing products, it will be helpful to switch to products containing magnesium if your caregiver says it is okay.   If you have been given a liquid injection (enema) today, this is only a temporary measure. It should not be relied on for treatment of longstanding (chronic) constipation.   Stronger measures, such as magnesium sulfate,  should be avoided if possible. This may cause uncontrollable diarrhea. Using magnesium sulfate may not allow you time to make it to the bathroom.  SEEK IMMEDIATE MEDICAL CARE IF:   There is bright red blood in the stool.   The constipation stays for more than 4 days.   There is belly (abdominal) or rectal pain.   You do not seem to be getting better.   You have any questions or concerns.  MAKE SURE YOU:   Understand these instructions.   Will watch your condition.   Will get help right away if you are not doing well or get worse.  Document Released: 12/23/2003 Document Revised: 12/06/2010 Document Reviewed: 11/12/2007 [facility] Patient Information 2012 [place], [place].

## 2011-02-28 NOTE — Progress Notes (Signed)
Primary Care Physician:  Monica Becton, MD, MD  Primary Gastroenterologist:  Roetta Sessions, MD   Chief Complaint  Patient presents with  . Colonoscopy    abd pain/constipation  . EGD    HPI:  Rhonda Garcia is a 70 y.o. female here for consideration of EGD and colonoscopy. She was hospitalized at [facility] on 02/20/2011 with atypical chest pain. She states when she was discharged, it was advised that she undergo an upper endoscopy. She was last seen here back in 2009 at time of EGD and colonoscopy for chronic diarrhea and nausea. See below for details.   Having a lot of problems with constipation since 07/2010. Affecting appetite. Weight down 20 pounds. Going to weight watchers, feels like losing weight too fast. In June she had a CT scan which showed distal ileal thickening. For past one month, acid reflux worse. Throat raw. Hurts in epigastrium and into neck. Complains of dysphagia to solid foods. Complains of nocturnal reflux which wakes her up. Complains of early satiety and nausea without vomiting has tried Gas-X, Maalox, taking Prilosec 20 mg 3 times a day without relief. Feels bloated and has to strain to have gas. No brbpr, melena. Amitiza cost her $100 per month so cannot take it daily. Stools hard balls. Has tried taking numerous medications including Dulcolax, MOM, colon cleanse, Miralax. Sometimes has to take more than 1 at a time. If she is not take a laxative she will not have a bowel movement. Used to have more diarrhea issues back when we saw her in 2009. Brother had melanoma. Sister has colon cancer, age 18. Diagnosed at age 10.  Current Outpatient Prescriptions  Medication Sig Dispense Refill  . Acetaminophen (APAP) 500 MG CAPS Take by mouth every 8 (eight) hours as needed.        Marland Kitchen amLODipine (NORVASC) 5 MG tablet Take 5 mg by mouth daily.        Marland Kitchen aspirin 325 MG tablet Take 325 mg by mouth daily.        . Blood Glucose Monitoring Suppl (GLUCOCOM  MONITOR) W/DEVICE KIT by Does not apply route 2 (two) times daily.        Marland Kitchen glucose blood test strip 1 each by Other route 2 (two) times daily. Use as instructed       . HYDROcodone-acetaminophen (VICODIN) 5-500 MG per tablet Take 1 tablet by mouth every 6 (six) hours as needed.        . isosorbide mononitrate (IMDUR) 30 MG 24 hr tablet Take 30 mg by mouth daily.        . Lancets MISC by Does not apply route 2 (two) times daily.        Marland Kitchen lisinopril-hydrochlorothiazide (PRINZIDE,ZESTORETIC) 20-25 MG per tablet Take 1 tablet by mouth daily.        Marland Kitchen lubiprostone (AMITIZA) 24 MCG capsule Take 24 mcg by mouth 2 (two) times daily with a meal.        . metFORMIN (GLUCOPHAGE) 500 MG tablet Take 500 mg by mouth 2 (two) times daily with a meal.        . metoprolol (TOPROL-XL) 50 MG 24 hr tablet Take 50 mg by mouth daily.        . nitroGLYCERIN (NITROSTAT) 0.4 MG SL tablet Place 0.4 mg under the tongue every 5 (five) minutes as needed.        Marland Kitchen omeprazole (PRILOSEC) 20 MG capsule Take 20 mg by mouth 3 (three) times daily.       Marland Kitchen  pravastatin (PRAVACHOL) 40 MG tablet Take 40 mg by mouth daily.        . Alcohol Swabs 70 % PADS by Does not apply route 2 (two) times daily.          Allergies as of 02/28/2011 - Review Complete 02/28/2011  Allergen Reaction Noted  . Phenergan Anaphylaxis 09/06/2010  . Promethazine hcl Other (See Comments)     Past Medical History  Diagnosis Date  . Diabetes mellitus   . CAD (coronary artery disease)   . MI (myocardial infarction)   . GERD (gastroesophageal reflux disease)   . Osteopenia   . Hypertension   . Hyperlipidemia   . Constipation   . Anal fissure     resolved   . Chronic pain   . Osteoarthritis   . DDD (degenerative disc disease)   . Anemia   . Airway compromise     Inability to tolerate continuous positive airway pressure  . Hiatal hernia   . Dyslipidemia   . Sleep apnea     Past Surgical History  Procedure Date  . Left knee repair   .  Appendectomy   . Cholecystectomy   . Complete hysterectomy   . Breast reduction surgery   . Esophagogastroduodenoscopy 06/2007    Small hiatal hernia  . Colonoscopy 06/2007    Friable anal canal and anal papillae. Pancolonic diverticula. Normal terminal ileum. Status post segmental biopsy, descending colon biopsy showed minimal cryptitis. Biopsies felt to be  nonspecific but could be seen with NSAIDs. No features of inflammatory bowel disease. Stool studies were negative.    Family History  Problem Relation Age of Onset  . Heart attack Mother   . Hypertension Mother   . Stroke Mother     Deceased, age 12  . Stroke Sister   . Diabetes Sister   . Diabetes Sister   . Cancer Sister     colon cancer, dx at age 68, recurrent this year  . Diabetes Brother     also has a blood disorder   . Melanoma Brother     deceased  . Coronary artery disease Other   . Kidney disease Other     History   Social History  . Marital Status: Married    Spouse Name: N/A    Number of Children: 3  . Years of Education: N/A   Occupational History  . Retired    Social History Main Topics  . Smoking status: Former Smoker -- 0.5 packs/day    Types: Cigarettes  . Smokeless tobacco: Not on file   Comment: quit 30 +years ago  . Alcohol Use: No  . Drug Use: No  . Sexually Active: Not on file   Other Topics Concern  . Not on file   Social History Narrative   Married      ROS:  General: Negative for fever, chills, fatigue, weakness. See history of present illness. Eyes: Negative for vision changes.  ENT: Negative for hoarseness, nasal congestion. See history of present illness. CV: Negative for chest pain, angina, palpitations, dyspnea on exertion, peripheral edema.  Respiratory: Negative for dyspnea at rest, dyspnea on exertion, cough, sputum, wheezing.  GI: See history of present illness. GU:  Negative for dysuria, hematuria, urinary incontinence, urinary frequency, nocturnal urination.    MS: Negative for joint pain, low back pain. Complains of pain in between the shoulder blades, stabbing, last for 30 minutes. Unrelated to meals. Sometimes wakes her up at night. Derm: Negative for rash or itching.  Neuro: Negative for weakness, abnormal sensation, seizure, frequent headaches, memory loss, confusion.  Psych: Negative for anxiety, depression, suicidal ideation, hallucinations.  Endo: See history of present illness.   Heme: Negative for bruising or bleeding. Allergy: Negative for rash or hives.    Physical Examination:  BP 108/63  Pulse 73  Temp(Src) 97.4 F (36.3 C) (Temporal)  Ht 5\' 2"  (1.575 m)  Wt 179 lb 12.8 oz (81.557 kg)  BMI 32.89 kg/m2   General: Well-nourished, well-developed in no acute distress.  Head: Normocephalic, atraumatic.   Eyes: Conjunctiva pink, no icterus. Mouth: Oropharyngeal mucosa moist and pink , no lesions erythema or exudate. Neck: Supple without thyromegaly, masses, or lymphadenopathy.  Lungs: Clear to auscultation bilaterally.  Heart: Regular rate and rhythm, no murmurs rubs or gallops.  Abdomen: Bowel sounds are normal, mild right lower quadrant tenderness, nondistended, no hepatosplenomegaly or masses, no abdominal bruits or    hernia , no rebound or guarding.   Rectal: Deferred at time of colonoscopy. Extremities: No lower extremity edema. No clubbing or deformities.  Neuro: Alert and oriented x 4 , grossly normal neurologically.  Skin: Warm and dry, no rash or jaundice.   Psych: Alert and cooperative, normal mood and affect.  Labs: 02/19/2011, done at [facility]. Glucose 162, creatinine 0.62, calcium 9.1, albumin 3.9, alkaline phosphatase 85, AST 18, total bilirubin 0.5, ALT 17, sodium 141, potassium 3.5, white blood cell count 5100, hemoglobin 13.1, hematocrit 38.8, platelets 154,000.

## 2011-02-28 NOTE — Assessment & Plan Note (Addendum)
One month history of worsening GERD symptoms. Recent hospitalization for atypical chest pain. Complains of nocturnal symptoms, solid food dysphagia, epigastric pain. Denies NSAIDs. Takes aspirin a day when she remembers. Complains of early satiety and 20 pound weight loss. Admits that she has been trying to lose weight but she feels like she is losing faster than she should based on dietary changes she is making. Taking Prilosec 20 mg 3 times a day. Recommend EGD for further evaluation of the above-stated symptoms. May need esophageal dilation as well.  I have discussed the risks, alternatives, benefits with regards to but not limited to the risk of reaction to medication, bleeding, infection, perforation and the patient is agreeable to proceed. Written consent to be obtained.  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 Await EGD findings prior to further workup. Stop Prilosec. Begin Dexilant 60mg  po daily, #20 samples provided.

## 2011-03-05 NOTE — Progress Notes (Signed)
Cc to PCP 

## 2011-03-07 ENCOUNTER — Other Ambulatory Visit: Payer: Self-pay | Admitting: Gastroenterology

## 2011-03-07 DIAGNOSIS — R634 Abnormal weight loss: Secondary | ICD-10-CM

## 2011-03-07 DIAGNOSIS — K219 Gastro-esophageal reflux disease without esophagitis: Secondary | ICD-10-CM

## 2011-03-13 ENCOUNTER — Encounter (HOSPITAL_COMMUNITY): Payer: Self-pay | Admitting: Pharmacy Technician

## 2011-03-20 MED ORDER — SODIUM CHLORIDE 0.45 % IV SOLN
Freq: Once | INTRAVENOUS | Status: AC
  Administered 2011-03-21: 12:00:00 via INTRAVENOUS

## 2011-03-21 ENCOUNTER — Encounter (HOSPITAL_COMMUNITY): Payer: Self-pay | Admitting: *Deleted

## 2011-03-21 ENCOUNTER — Encounter (HOSPITAL_COMMUNITY)
Admission: RE | Disposition: A | Discharge status: Home / Self Care | Payer: Self-pay | Source: Ambulatory Visit | Attending: Internal Medicine

## 2011-03-21 ENCOUNTER — Other Ambulatory Visit: Payer: Self-pay | Admitting: Internal Medicine

## 2011-03-21 ENCOUNTER — Ambulatory Visit (HOSPITAL_COMMUNITY)
Admission: RE | Admit: 2011-03-21 | Discharge: 2011-03-21 | Disposition: A | Discharge status: Home / Self Care | Payer: Medicare PPO | Source: Ambulatory Visit | Attending: Internal Medicine | Admitting: Internal Medicine

## 2011-03-21 DIAGNOSIS — R1310 Dysphagia, unspecified: Secondary | ICD-10-CM

## 2011-03-21 DIAGNOSIS — K5730 Diverticulosis of large intestine without perforation or abscess without bleeding: Secondary | ICD-10-CM

## 2011-03-21 DIAGNOSIS — Z79899 Other long term (current) drug therapy: Secondary | ICD-10-CM | POA: Insufficient documentation

## 2011-03-21 DIAGNOSIS — K219 Gastro-esophageal reflux disease without esophagitis: Secondary | ICD-10-CM

## 2011-03-21 DIAGNOSIS — K5909 Other constipation: Secondary | ICD-10-CM | POA: Insufficient documentation

## 2011-03-21 DIAGNOSIS — Z800 Family history of malignant neoplasm of digestive organs: Secondary | ICD-10-CM

## 2011-03-21 DIAGNOSIS — K449 Diaphragmatic hernia without obstruction or gangrene: Secondary | ICD-10-CM | POA: Insufficient documentation

## 2011-03-21 DIAGNOSIS — R079 Chest pain, unspecified: Secondary | ICD-10-CM

## 2011-03-21 DIAGNOSIS — I10 Essential (primary) hypertension: Secondary | ICD-10-CM | POA: Insufficient documentation

## 2011-03-21 DIAGNOSIS — R112 Nausea with vomiting, unspecified: Secondary | ICD-10-CM | POA: Insufficient documentation

## 2011-03-21 DIAGNOSIS — R198 Other specified symptoms and signs involving the digestive system and abdomen: Secondary | ICD-10-CM

## 2011-03-21 DIAGNOSIS — R6881 Early satiety: Secondary | ICD-10-CM | POA: Insufficient documentation

## 2011-03-21 DIAGNOSIS — D126 Benign neoplasm of colon, unspecified: Secondary | ICD-10-CM

## 2011-03-21 DIAGNOSIS — E119 Type 2 diabetes mellitus without complications: Secondary | ICD-10-CM | POA: Insufficient documentation

## 2011-03-21 DIAGNOSIS — R634 Abnormal weight loss: Secondary | ICD-10-CM

## 2011-03-21 HISTORY — PX: ESOPHAGOGASTRODUODENOSCOPY: SHX1529

## 2011-03-21 HISTORY — PX: COLONOSCOPY: SHX5424

## 2011-03-21 SURGERY — COLONOSCOPY
Anesthesia: Moderate Sedation

## 2011-03-21 MED ORDER — RABEPRAZOLE SODIUM 20 MG PO TBEC: 20.0000 mg | DELAYED_RELEASE_TABLET | Freq: Every day | ORAL | Status: DC

## 2011-03-21 MED ORDER — MEPERIDINE HCL 100 MG/ML IJ SOLN
INTRAMUSCULAR | Status: AC
  Filled 2011-03-21: qty 2

## 2011-03-21 MED ORDER — MIDAZOLAM HCL 5 MG/5ML IJ SOLN
INTRAMUSCULAR | Status: DC | PRN
  Administered 2011-03-21 (×4): 1 mg via INTRAVENOUS
  Administered 2011-03-21: 2 mg via INTRAVENOUS

## 2011-03-21 MED ORDER — BUTAMBEN-TETRACAINE-BENZOCAINE 2-2-14 % EX AERO
INHALATION_SPRAY | CUTANEOUS | Status: DC | PRN
  Administered 2011-03-21: 2 via TOPICAL

## 2011-03-21 MED ORDER — MEPERIDINE HCL 100 MG/ML IJ SOLN
INTRAMUSCULAR | Status: DC | PRN
  Administered 2011-03-21 (×3): 25 mg via INTRAVENOUS

## 2011-03-21 MED ORDER — STERILE WATER FOR IRRIGATION IR SOLN
Status: DC | PRN
  Administered 2011-03-21: 13:00:00

## 2011-03-21 MED ORDER — MIDAZOLAM HCL 5 MG/5ML IJ SOLN
INTRAMUSCULAR | Status: AC
  Filled 2011-03-21: qty 10

## 2011-03-21 NOTE — H&P (Signed)
  I have seen & examined the patient prior to the procedure(s) today and reviewed the history and physical/consultation.  There have been no changes.  After consideration of the risks, benefits, alternatives and imponderables, the patient has consented to the procedure(s).   

## 2011-03-21 NOTE — Op Note (Signed)
[name] [address] [place], [place]  [address]  ENDOSCOPY PROCEDURE REPORT  PATIENT:  Rhonda, Garcia  MR#:  [id number] BIRTHDATE:  07-May-1940, 70 yrs. old  GENDER:  female  ENDOSCOPIST:  R. Roetta Sessions, MD [facility] Referred by:           Dr. Vernon Prey  PROCEDURE DATE:  03/21/2011 PROCEDURE:   EGD with Elease Hashimoto dilation  INDICATIONS:   esophageal dysphagia; chest pain not felt to be cardiac in origin; poorly-controlled GERD  INFORMED CONSENT:   The risks, benefits, limitations, alternatives and imponderables have been discussed.  The potential for biopsy, esophogeal dilation, etc. have also been reviewed.  Questions have been answered.  All parties agreeable.  Please see the history and physical in the medical record for more information.  MEDICATIONS:       Demerol 75 mg IV and Versed 5 mg IV in divided doses. Cetacaine spray  DESCRIPTION OF PROCEDURE:   The XB-[id number] ([id number]) endoscope was introduced through the mouth and advanced to the second portion of the duodenum without difficulty or limitations.  The mucosal surfaces were surveyed very carefully during advancement of the scope and upon withdrawal.  Retroflexion view of the proximal stomach and esophagogastric junction was performed.  <<PROCEDUREIMAGES>>  FINDINGS:   Normal appearing, patent tubular esophagus. Small hiatal hernia. Tiny antral and bulbar erosions ; otherwise, negative      exam  THERAPEUTIC / DIAGNOSTIC MANEUVERS PERFORMED:   A 54 French Maloney dilator was passed to full insertion easily. a look back reveal no apparent complication related to passage of the dilator  COMPLICATIONS:   None  IMPRESSION:               Normal esophagus -  status post passage of Maloney dilator. Small hiatal hernia. Trivial antral and bulbar erosions  RECOMMENDATIONS:        Stop Prilosec ; begin a trial of AcipHex 20 mg orally daily.  See colonoscopy report.  ______________________________ R.  Roetta Sessions, MD Caleen Essex  CC:  n. eSIGNED:   R. Roetta Sessions at 03/21/2011 01:27 PM  [place], [place], [phone]

## 2011-03-22 ENCOUNTER — Ambulatory Visit: Payer: Medicare PPO | Admitting: Gastroenterology

## 2011-03-22 NOTE — Op Note (Signed)
[facility] [address] [place], [place]  [address]  COLONOSCOPY PROCEDURE REPORT  PATIENT:  Rhonda Garcia, Rhonda Garcia  MR#:  [id number] BIRTHDATE:  1941-04-04, 70 yrs. old  GENDER:  female ENDOSCOPIST:  R. Roetta Sessions, MD FACP [facility] REF. BY:           Dr. Rudi Heap PROCEDURE DATE:  03/21/2011 PROCEDURE:   ileo-colonoscopy with snare polypectomy and biopsy  INDICATIONS:     Change in bowel habits. Positive family history of colon cancer.  INFORMED CONSENT:  The risks, benefits, alternatives and imponderables including but not limited to bleeding, perforation as well as the possibility of a missed lesion have been reviewed. The potential for biopsy, lesion removal, etc. have also been discussed.  Questions have been answered.  All parties agreeable. Please see the history and physical in the medical record for more information.  MEDICATIONS:   Versed 6 mg IV; milligrams IV in divided doses  DESCRIPTION OF PROCEDURE:  After a digital rectal exam was performed, the [id number] ([id number]) colonoscope was advanced from the anus through the rectum and colon to the area of the cecum, ileocecal valve and appendiceal orifice.  The cecum was deeply intubated.  These structures were well-seen and photographed for the record.  From the level of the cecum and ileocecal valve, the scope was slowly and cautiously withdrawn.  The mucosal surfaces were carefully surveyed utilizing scope tip deflection to facilitate fold flattening as needed.  The scope was pulled down into the rectum where a thorough examination including retroflexion was performed. <<PROCEDUREIMAGES>>  FINDINGS: adequate preparation. Left-sided and transverse diverticula; (2) diminutive polyps in the base of the cecum; a 5 mm polyp noted in the mid  descending colon; remainder of colonic mucosa and distal 10 cm of terminal ileal mucosa appeared normal. The rectum was normal aside from a single anal papilla.  THERAPEUTIC  / DIAGNOSTIC MANEUVERS PERFORMED:    The 2 cecal polyps were cold biopsied/removed; the ascending colon polyp was cold snared removed  COMPLICATIONS:   none  CECAL WITHDRAWAL TIME: 12 minutes  IMPRESSION:  Colonic polyps treated as described above. colonic diverticulosis  RECOMMENDATIONS:    Continue amitiza - this is working very well for her constipation. Follow up on pathology  ______________________________ R. Roetta Sessions, MD Caleen Essex  CC:  Rudi Heap, MD  n. eSIGNED:   R. Roetta Sessions at 03/21/2011 01:50 PM  [place], [place], [phone]

## 2011-04-05 ENCOUNTER — Encounter (HOSPITAL_COMMUNITY): Payer: Self-pay | Admitting: Internal Medicine

## 2011-07-19 ENCOUNTER — Other Ambulatory Visit: Payer: Self-pay | Admitting: Gastroenterology

## 2011-07-19 MED ORDER — PANTOPRAZOLE SODIUM 40 MG PO TBEC: 40.0000 mg | DELAYED_RELEASE_TABLET | Freq: Every day | ORAL | Status: DC

## 2011-07-19 NOTE — Progress Notes (Signed)
Pt has tried Omeprazole and Dexilant in past. Aciphex prescribed after EGD. Unfortunately, insurance will not cover until has tried 2 formulary alternatives. Sent rx for Protonix X 1 month. Please ask pt to call us if improvement with this, and we will extend her rx. If not, we may try a PA for Aciphex. Otherwise, this is not productive as insurance will still require a step-wise approach.

## 2011-07-24 NOTE — Progress Notes (Signed)
Pt aware.

## 2011-10-17 ENCOUNTER — Other Ambulatory Visit: Payer: Self-pay

## 2011-11-19 ENCOUNTER — Ambulatory Visit: Payer: Medicare PPO | Admitting: Cardiology

## 2011-12-31 ENCOUNTER — Ambulatory Visit (INDEPENDENT_AMBULATORY_CARE_PROVIDER_SITE_OTHER): Payer: Medicare PPO | Admitting: Cardiology

## 2011-12-31 ENCOUNTER — Encounter: Payer: Self-pay | Admitting: Cardiology

## 2011-12-31 VITALS — BP 164/100 | HR 75 | Ht 62.0 in | Wt 193.0 lb

## 2011-12-31 DIAGNOSIS — I10 Essential (primary) hypertension: Secondary | ICD-10-CM

## 2011-12-31 DIAGNOSIS — E785 Hyperlipidemia, unspecified: Secondary | ICD-10-CM

## 2011-12-31 DIAGNOSIS — I2510 Atherosclerotic heart disease of native coronary artery without angina pectoris: Secondary | ICD-10-CM

## 2011-12-31 NOTE — Patient Instructions (Addendum)
Your physician recommends that you schedule a follow-up appointment in: 2 months. Your physician recommends that you continue on your current medications as directed. Please take your medications as prescribed. Please refer to the Current Medication list given to you today.

## 2011-12-31 NOTE — Progress Notes (Signed)
HPI The patient presents for evaluation of known coronary disease. She has a history of stenting as described below. Her last catheterization was 2010. Stress perfusion study in 2012 did not suggest any high-risk findings. She presents with multiple complaints for followup. She does get some chest discomfort. She says this happens sporadically. She describes a sharp discomfort. It radiates up to her right arm. It radiates up to the right side of her neck. She says it's been going on for a couple of months but actually getting better. She cannot bring it on with activity.  She does not get associated nausea vomiting or diaphoresis. She does have epigastric discomfort and stomach problems. She has problems with her balance. She is chronic leg pain. Concerning is the fact that she says she forgets to take all of her medication sometimes for days at a time. She didn't take any today. She's actually been off Imdur completely for reasons that have not been clear.  Allergies  Allergen Reactions  . Promethazine Hcl Anaphylaxis  . Penicillins   . Promethazine Hcl Other (See Comments)    Current Outpatient Prescriptions  Medication Sig Dispense Refill  . Alcohol Swabs 70 % PADS by Does not apply route 2 (two) times daily.        Marland Kitchen ALPRAZolam (XANAX) 0.5 MG tablet Take 0.5 mg by mouth every 12 (twelve) hours as needed.      Marland Kitchen amLODipine (NORVASC) 5 MG tablet Take 5 mg by mouth daily.        Marland Kitchen aspirin 81 MG tablet Take 81 mg by mouth daily.      . Blood Glucose Monitoring Suppl (GLUCOCOM MONITOR) W/DEVICE KIT by Does not apply route 2 (two) times daily.        Marland Kitchen glucose blood test strip 1 each by Other route 2 (two) times daily. Use as instructed       . HYDROcodone-acetaminophen (VICODIN) 5-500 MG per tablet Take 1 tablet by mouth 2 (two) times daily as needed. pain      . Lancets MISC by Does not apply route 2 (two) times daily.        Marland Kitchen lisinopril-hydrochlorothiazide (PRINZIDE,ZESTORETIC) 20-25 MG per  tablet Take 1 tablet by mouth daily.        . metFORMIN (GLUCOPHAGE) 500 MG tablet Take 500 mg by mouth 2 (two) times daily with a meal.        . metoprolol (LOPRESSOR) 50 MG tablet Take 50 mg by mouth Daily.      . nitroGLYCERIN (NITROSTAT) 0.4 MG SL tablet Place 0.4 mg under the tongue every 5 (five) minutes as needed.        . NON FORMULARY Take 1,500 mg by mouth every other day. Lipozene      . pantoprazole (PROTONIX) 40 MG tablet Take 1 tablet (40 mg total) by mouth daily.  30 tablet  1  . polyethylene glycol powder (CLEARLAX) powder Take 17 g by mouth as needed.      . pravastatin (PRAVACHOL) 40 MG tablet Take 40 mg by mouth at bedtime.         Past Medical History  Diagnosis Date  . Diabetes mellitus   . CAD (coronary artery disease)   . MI (myocardial infarction)   . GERD (gastroesophageal reflux disease)   . Osteopenia   . Hypertension   . Hyperlipidemia   . Constipation   . Anal fissure     resolved   . Chronic pain   . Osteoarthritis   .  DDD (degenerative disc disease)   . Anemia   . Airway compromise     Inability to tolerate continuous positive airway pressure  . Hiatal hernia   . Dyslipidemia   . Sleep apnea     Past Surgical History  Procedure Date  . Left knee repair   . Appendectomy   . Cholecystectomy   . Complete hysterectomy   . Breast reduction surgery   . Esophagogastroduodenoscopy 06/2007    Small hiatal hernia  . Colonoscopy 06/2007    Friable anal canal and anal papillae. Pancolonic diverticula. Normal terminal ileum. Status post segmental biopsy, descending colon biopsy showed minimal cryptitis. Biopsies felt to be  nonspecific but could be seen with NSAIDs. No features of inflammatory bowel disease. Stool studies were negative.  . Colonoscopy 03/21/2011    Procedure: COLONOSCOPY;  Surgeon: Corbin Ade, MD;  Location: [facility];  Service: Endoscopy;  Laterality: N/A;  12:30    ROS:  As stated in the HPI and negative for all other  systems.  PHYSICAL EXAM BP 164/100  Pulse 75  Ht 5\' 2"  (1.575 m)  Wt 193 lb (87.544 kg)  BMI 35.30 kg/m2 GENERAL:  Well appearing HEENT:  Pupils equal round and reactive, fundi not visualized, oral mucosa unremarkable NECK:  No jugular venous distention, waveform within normal limits, carotid upstroke brisk and symmetric, no bruits, no thyromegaly LYMPHATICS:  No cervical, inguinal adenopathy LUNGS:  Clear to auscultation bilaterally BACK:  No CVA tenderness CHEST:  Unremarkable HEART:  PMI not displaced or sustained,S1 and S2 within normal limits, no S3, no S4, no clicks, no rubs, no murmurs ABD:  Flat, positive bowel sounds normal in frequency in pitch, no bruits, no rebound, no guarding, no midline pulsatile mass, no hepatomegaly, no splenomegaly EXT:  2 plus pulses throughout, no edema, no cyanosis no clubbing SKIN:  No rashes no nodules NEURO:  Cranial nerves II through XII grossly intact, motor grossly intact throughout PSYCH:  Cognitively intact, oriented to person place and time  EKG:  Normal sinus rhythm, rate 75, axis within normal limits, intervals within normal limits, no acute ST-T wave changes.  12/31/2011   ASSESSMENT AND PLAN   CAD, NATIVE VESSEL  The patient's symptoms are somewhat atypical. She had a low risk perfusion study in 2011. Her symptoms have actually improved. What is most concerning is that she does not take her medications. In particular she was supposed to be on Imdur but for some reason she thought she was supposed to stop this. I'm going to have her restart this medication at 30 mg and encouraged her to take her medications as prescribed. If she has increasing symptoms going for we would need to reevaluate her. She says she would likely refuse stress perfusion study so at that point we might need to consider repeat cardiac catheterization.   HYPERTENSION, UNSPECIFIED The blood pressure is at target. No change in medications is indicated. We will  continue with therapeutic lifestyle changes (TLC). Again I emphasized that she needs to be compliant with her medications.   HYPERLIPIDEMIA This is managed by Horald Pollen., PA.  I don't see any recent labs. I will defer to his management with the suggested LDL goal less than 70 and HDL greater than 50   DM Her hemoglobin A1c was up to 7. We discussed this. Again I think there is an issue of compliance. I will defer management to Mr. Caryn Bee.

## 2012-02-05 ENCOUNTER — Encounter (INDEPENDENT_AMBULATORY_CARE_PROVIDER_SITE_OTHER): Payer: Self-pay

## 2012-02-12 ENCOUNTER — Encounter (INDEPENDENT_AMBULATORY_CARE_PROVIDER_SITE_OTHER): Payer: Self-pay | Admitting: General Surgery

## 2012-02-12 ENCOUNTER — Encounter (INDEPENDENT_AMBULATORY_CARE_PROVIDER_SITE_OTHER): Payer: Self-pay

## 2012-02-12 ENCOUNTER — Ambulatory Visit (INDEPENDENT_AMBULATORY_CARE_PROVIDER_SITE_OTHER): Payer: Medicare PPO | Admitting: General Surgery

## 2012-02-12 VITALS — BP 150/84 | HR 72 | Temp 97.3°F | Resp 16 | Ht 62.0 in | Wt 187.2 lb

## 2012-02-12 DIAGNOSIS — D1779 Benign lipomatous neoplasm of other sites: Secondary | ICD-10-CM

## 2012-02-12 DIAGNOSIS — D171 Benign lipomatous neoplasm of skin and subcutaneous tissue of trunk: Secondary | ICD-10-CM | POA: Insufficient documentation

## 2012-02-12 NOTE — Progress Notes (Signed)
The patient comes in with a complaint of lower and upper back pain on the right side. She stresses that she's had this lesion in the right lower back for some years however is recently causing her more discomfort which she attributes to this lesion. Occasionally he gets more swollen than at other times. She has a history of diabetes coronary artery disease a history of a prior myocardial infarction reflux disease hypertension hiatal hernia sleep apnea thyroid disease. Surgical history she's had an appendectomy breast reduction and a prior cholecystectomy and abdominal hysterectomy.  On examination her right lower back she has a mobile deep subcutaneous lesion that measures approximately 4-5 cm in size. It appears to be a lipoma and not a sebaceous cyst.  The patient has some risks for surgery including coronary artery disease and history of myocardial infarction along with diabetes. Because of that she will need cardiac clearance prior to surgery however we will plan on an outpatient excision of this lesion in her right lower back.

## 2012-02-19 ENCOUNTER — Encounter: Payer: Self-pay | Admitting: Cardiology

## 2012-02-19 ENCOUNTER — Ambulatory Visit (INDEPENDENT_AMBULATORY_CARE_PROVIDER_SITE_OTHER): Payer: Medicare PPO | Admitting: Cardiology

## 2012-02-19 VITALS — BP 152/82 | HR 51 | Ht 62.0 in | Wt 193.0 lb

## 2012-02-19 DIAGNOSIS — I10 Essential (primary) hypertension: Secondary | ICD-10-CM

## 2012-02-19 DIAGNOSIS — I2510 Atherosclerotic heart disease of native coronary artery without angina pectoris: Secondary | ICD-10-CM

## 2012-02-19 DIAGNOSIS — E785 Hyperlipidemia, unspecified: Secondary | ICD-10-CM

## 2012-02-19 NOTE — Patient Instructions (Addendum)

## 2012-02-19 NOTE — Progress Notes (Signed)
HPI The patient presents for evaluation of known coronary disease. She has a history of stenting as described below. Her last catheterization was 2010. Stress perfusion study in 2012 did not suggest any high-risk findings.  He does have continued chest pain. However, at the last visit her symptoms seemed to have actually improved. What was most concerning was that she does not take her medications. In particular she was supposed to be on Imdur but for some reason she thought she was supposed to stop this. I restarted this medication at 30 mg and encouraged her to take her medications as prescribed.   Since that visit the patient has had no further chest pain. She has had some headaches with this did not start after the Imdur. She actually went to the emergency room once for management of this. These have been sporadic and not daily. She's not having any new chest pressure, neck or arm discomfort. She's not having any new palpitations, presyncope or syncope. She actually think she is doing better than previous.  Allergies  Allergen Reactions  . Promethazine Hcl Anaphylaxis  . Penicillins   . Promethazine Hcl Other (See Comments)    Current Outpatient Prescriptions  Medication Sig Dispense Refill  . Alcohol Swabs 70 % PADS by Does not apply route 2 (two) times daily.        Marland Kitchen ALPRAZolam (XANAX) 0.5 MG tablet Take 0.5 mg by mouth every 12 (twelve) hours as needed.      Marland Kitchen amLODipine (NORVASC) 5 MG tablet Take 5 mg by mouth daily.        Marland Kitchen aspirin 81 MG tablet Take 81 mg by mouth daily.      . Blood Glucose Monitoring Suppl (GLUCOCOM MONITOR) W/DEVICE KIT by Does not apply route 2 (two) times daily.        Marland Kitchen glucose blood test strip 1 each by Other route 2 (two) times daily. Use as instructed       . HYDROcodone-acetaminophen (VICODIN) 5-500 MG per tablet Take 1 tablet by mouth 2 (two) times daily as needed. pain      . isosorbide mononitrate (IMDUR) 30 MG 24 hr tablet Take 30 mg by mouth  daily.      . Lancets MISC by Does not apply route 2 (two) times daily.        Marland Kitchen lisinopril-hydrochlorothiazide (PRINZIDE,ZESTORETIC) 20-25 MG per tablet Take 1 tablet by mouth daily.        . metFORMIN (GLUCOPHAGE) 500 MG tablet Take 500 mg by mouth 2 (two) times daily with a meal.        . metoprolol (LOPRESSOR) 50 MG tablet Take 50 mg by mouth Daily.      . nitroGLYCERIN (NITROSTAT) 0.4 MG SL tablet Place 0.4 mg under the tongue every 5 (five) minutes as needed.        . NON FORMULARY Take 1,500 mg by mouth every other day. Lipozene      . pantoprazole (PROTONIX) 40 MG tablet Take 1 tablet (40 mg total) by mouth daily.  30 tablet  1  . pioglitazone (ACTOS) 30 MG tablet Daily.      . polyethylene glycol powder (CLEARLAX) powder Take 17 g by mouth as needed.      . pravastatin (PRAVACHOL) 40 MG tablet Take 40 mg by mouth at bedtime.       . sertraline (ZOLOFT) 50 MG tablet Daily.        Past Medical History  Diagnosis Date  .  Diabetes mellitus   . CAD (coronary artery disease)   . MI (myocardial infarction)   . GERD (gastroesophageal reflux disease)   . Osteopenia   . Hypertension   . Hyperlipidemia   . Constipation   . Anal fissure     resolved   . Chronic pain   . Osteoarthritis   . DDD (degenerative disc disease)   . Anemia   . Airway compromise     Inability to tolerate continuous positive airway pressure  . Hiatal hernia   . Dyslipidemia   . Sleep apnea   . Osteoporosis   . Thyroid disease   . Hearing loss   . Generalized headaches   . Rectal pain   . Change in voice   . Sebaceous cyst     on back    Past Surgical History  Procedure Date  . Left knee repair   . Appendectomy   . Complete hysterectomy   . Breast reduction surgery   . Esophagogastroduodenoscopy 06/2007    Small hiatal hernia  . Colonoscopy 06/2007    Friable anal canal and anal papillae. Pancolonic diverticula. Normal terminal ileum. Status post segmental biopsy, descending colon biopsy showed  minimal cryptitis. Biopsies felt to be  nonspecific but could be seen with NSAIDs. No features of inflammatory bowel disease. Stool studies were negative.  . Colonoscopy 03/21/2011    Procedure: COLONOSCOPY;  Surgeon: Corbin Ade, MD;  Location: [facility];  Service: Endoscopy;  Laterality: N/A;  12:30  . Cholecystectomy 2008 - approximate  . Abdominal hysterectomy 2001 - approximate    ROS:  As stated in the HPI and negative for all other systems.  PHYSICAL EXAM BP 152/82  Pulse 51  Ht 5\' 2"  (1.575 m)  Wt 193 lb (87.544 kg)  BMI 35.30 kg/m2  SpO2 93% GENERAL:  Well appearing NECK:  No jugular venous distention, waveform within normal limits, carotid upstroke brisk and symmetric, no bruits, no thyromegaly LUNGS:  Clear to auscultation bilaterally BACK:  No CVA tenderness CHEST:  Unremarkable HEART:  PMI not displaced or sustained,S1 and S2 within normal limits, no S3, no S4, no clicks, no rubs, no murmurs ABD:  Flat, positive bowel sounds normal in frequency in pitch, no bruits, no rebound, no guarding, no midline pulsatile mass, no hepatomegaly, no splenomegaly EXT:  2 plus pulses throughout, no edema, no cyanosis no clubbing   ASSESSMENT AND PLAN   CAD, NATIVE VESSEL  The patient's symptoms are improved. He seems to be tolerating her medication. She will continue with the meds as listed and no further imaging is indicated.  HYPERTENSION, UNSPECIFIED Her blood pressure is elevated today but she learned of the sudden death of friend. She thinks otherwise is well controlled. I will make no change her regimen.  HYPERLIPIDEMIA This is managed by Horald Pollen., PA.  I don't see any recent labs. I will defer to his management with the suggested LDL goal less than 70 and HDL greater than 50  DM Per Helene Kelp C., PA

## 2012-03-13 ENCOUNTER — Encounter (HOSPITAL_BASED_OUTPATIENT_CLINIC_OR_DEPARTMENT_OTHER): Payer: Self-pay | Admitting: *Deleted

## 2012-03-13 NOTE — Progress Notes (Signed)
Saw dr Antoine Poche 11/13-she was not taking her imbur-he put her back on it-no chest pain since-to come in for labs Bring all meds

## 2012-03-14 ENCOUNTER — Encounter (HOSPITAL_BASED_OUTPATIENT_CLINIC_OR_DEPARTMENT_OTHER)
Admission: RE | Admit: 2012-03-14 | Discharge: 2012-03-14 | Disposition: A | Discharge status: Home / Self Care | Payer: Medicare PPO | Source: Ambulatory Visit | Attending: General Surgery | Admitting: General Surgery

## 2012-03-14 LAB — CBC WITH DIFFERENTIAL/PLATELET
Basophils Absolute: 0 10*3/uL (ref 0.0–0.1)
Basophils Relative: 1 % (ref 0–1)
Eosinophils Absolute: 0.2 10*3/uL (ref 0.0–0.7)
Eosinophils Relative: 4 % (ref 0–5)
HCT: 41 % (ref 36.0–46.0)
Hemoglobin: 13.6 g/dL (ref 12.0–15.0)
Lymphocytes Relative: 35 % (ref 12–46)
Lymphs Abs: 1.8 10*3/uL (ref 0.7–4.0)
MCH: 30.4 pg (ref 26.0–34.0)
MCHC: 33.2 g/dL (ref 30.0–36.0)
MCV: 91.5 fL (ref 78.0–100.0)
Monocytes Absolute: 0.7 10*3/uL (ref 0.1–1.0)
Monocytes Relative: 13 % — ABNORMAL HIGH (ref 3–12)
Neutro Abs: 2.5 10*3/uL (ref 1.7–7.7)
Neutrophils Relative %: 48 % (ref 43–77)
Platelets: 192 10*3/uL (ref 150–400)
RBC: 4.48 MIL/uL (ref 3.87–5.11)
RDW: 14.1 % (ref 11.5–15.5)
WBC: 5.2 10*3/uL (ref 4.0–10.5)

## 2012-03-14 LAB — BASIC METABOLIC PANEL
BUN: 9 mg/dL (ref 6–23)
CO2: 29 mEq/L (ref 19–32)
Calcium: 10 mg/dL (ref 8.4–10.5)
Chloride: 107 mEq/L (ref 96–112)
Creatinine, Ser: 0.73 mg/dL (ref 0.50–1.10)
GFR calc Af Amer: >90 mL/min (ref >90)
GFR calc non Af Amer: 85 mL/min — ABNORMAL LOW (ref >90)
Glucose, Bld: 92 mg/dL (ref 70–99)
Potassium: 4.8 mEq/L (ref 3.5–5.1)
Sodium: 144 mEq/L (ref 135–145)

## 2012-03-17 ENCOUNTER — Encounter (HOSPITAL_BASED_OUTPATIENT_CLINIC_OR_DEPARTMENT_OTHER): Payer: Self-pay | Admitting: *Deleted

## 2012-03-17 ENCOUNTER — Encounter (HOSPITAL_BASED_OUTPATIENT_CLINIC_OR_DEPARTMENT_OTHER)
Admission: RE | Disposition: A | Discharge status: Home / Self Care | Payer: Self-pay | Source: Ambulatory Visit | Attending: General Surgery

## 2012-03-17 ENCOUNTER — Ambulatory Visit (HOSPITAL_BASED_OUTPATIENT_CLINIC_OR_DEPARTMENT_OTHER)
Admission: RE | Admit: 2012-03-17 | Discharge: 2012-03-17 | Disposition: A | Discharge status: Home / Self Care | Payer: Medicare PPO | Source: Ambulatory Visit | Attending: General Surgery | Admitting: General Surgery

## 2012-03-17 ENCOUNTER — Encounter (HOSPITAL_BASED_OUTPATIENT_CLINIC_OR_DEPARTMENT_OTHER): Payer: Self-pay | Admitting: Anesthesiology

## 2012-03-17 ENCOUNTER — Ambulatory Visit (HOSPITAL_BASED_OUTPATIENT_CLINIC_OR_DEPARTMENT_OTHER): Payer: Medicare PPO | Admitting: Anesthesiology

## 2012-03-17 DIAGNOSIS — I2510 Atherosclerotic heart disease of native coronary artery without angina pectoris: Secondary | ICD-10-CM | POA: Insufficient documentation

## 2012-03-17 DIAGNOSIS — D1739 Benign lipomatous neoplasm of skin and subcutaneous tissue of other sites: Secondary | ICD-10-CM

## 2012-03-17 DIAGNOSIS — K449 Diaphragmatic hernia without obstruction or gangrene: Secondary | ICD-10-CM | POA: Insufficient documentation

## 2012-03-17 DIAGNOSIS — E119 Type 2 diabetes mellitus without complications: Secondary | ICD-10-CM | POA: Insufficient documentation

## 2012-03-17 DIAGNOSIS — D171 Benign lipomatous neoplasm of skin and subcutaneous tissue of trunk: Secondary | ICD-10-CM

## 2012-03-17 DIAGNOSIS — I252 Old myocardial infarction: Secondary | ICD-10-CM | POA: Insufficient documentation

## 2012-03-17 DIAGNOSIS — I10 Essential (primary) hypertension: Secondary | ICD-10-CM | POA: Insufficient documentation

## 2012-03-17 DIAGNOSIS — K219 Gastro-esophageal reflux disease without esophagitis: Secondary | ICD-10-CM | POA: Insufficient documentation

## 2012-03-17 DIAGNOSIS — G4730 Sleep apnea, unspecified: Secondary | ICD-10-CM | POA: Insufficient documentation

## 2012-03-17 HISTORY — PX: LIPOMA EXCISION: SHX5283

## 2012-03-17 LAB — GLUCOSE, CAPILLARY
Glucose-Capillary: 66 mg/dL — ABNORMAL LOW (ref 70–99)
Glucose-Capillary: 69 mg/dL — ABNORMAL LOW (ref 70–99)
Glucose-Capillary: 83 mg/dL (ref 70–99)

## 2012-03-17 SURGERY — EXCISION LIPOMA
Anesthesia: General | Site: Back | Laterality: Right

## 2012-03-17 MED ORDER — ONDANSETRON HCL 4 MG/2ML IJ SOLN
INTRAMUSCULAR | Status: DC | PRN
  Administered 2012-03-17: 4 mg via INTRAVENOUS

## 2012-03-17 MED ORDER — DEXAMETHASONE SODIUM PHOSPHATE 4 MG/ML IJ SOLN
INTRAMUSCULAR | Status: DC | PRN
  Administered 2012-03-17: 5 mg via INTRAVENOUS

## 2012-03-17 MED ORDER — LACTATED RINGERS IV SOLN
INTRAVENOUS | Status: DC
  Administered 2012-03-17: 12:00:00 via INTRAVENOUS

## 2012-03-17 MED ORDER — LIDOCAINE HCL (CARDIAC) 20 MG/ML IV SOLN
INTRAVENOUS | Status: DC | PRN
  Administered 2012-03-17: 60 mg via INTRAVENOUS

## 2012-03-17 MED ORDER — MIDAZOLAM HCL 5 MG/5ML IJ SOLN
INTRAMUSCULAR | Status: DC | PRN
  Administered 2012-03-17: 2 mg via INTRAVENOUS

## 2012-03-17 MED ORDER — BUPIVACAINE-EPINEPHRINE 0.25% -1:200000 IJ SOLN
INTRAMUSCULAR | Status: DC | PRN
  Administered 2012-03-17: 10 mL

## 2012-03-17 MED ORDER — TRAMADOL HCL 50 MG PO TABS: 50.0000 mg | ORAL_TABLET | Freq: Four times a day (QID) | ORAL | Status: DC | PRN

## 2012-03-17 MED ORDER — PROPOFOL 10 MG/ML IV BOLUS
INTRAVENOUS | Status: DC | PRN
  Administered 2012-03-17: 120 mg via INTRAVENOUS

## 2012-03-17 MED ORDER — SUCCINYLCHOLINE CHLORIDE 20 MG/ML IJ SOLN
INTRAMUSCULAR | Status: DC | PRN
  Administered 2012-03-17: 100 mg via INTRAVENOUS

## 2012-03-17 MED ORDER — FENTANYL CITRATE 0.05 MG/ML IJ SOLN
INTRAMUSCULAR | Status: DC | PRN
  Administered 2012-03-17: 50 ug via INTRAVENOUS

## 2012-03-17 MED ORDER — FENTANYL CITRATE 0.05 MG/ML IJ SOLN: 50.0000 ug | INTRAMUSCULAR | Status: DC | PRN

## 2012-03-17 MED ORDER — EPHEDRINE SULFATE 50 MG/ML IJ SOLN
INTRAMUSCULAR | Status: DC | PRN
  Administered 2012-03-17: 10 mg via INTRAVENOUS

## 2012-03-17 SURGICAL SUPPLY — 53 items
BANDAGE GAUZE ELAST BULKY 4 IN (GAUZE/BANDAGES/DRESSINGS) IMPLANT
BENZOIN TINCTURE PRP APPL 2/3 (GAUZE/BANDAGES/DRESSINGS) IMPLANT
BLADE SURG 15 STRL LF DISP TIS (BLADE) ×1 IMPLANT
BLADE SURG 15 STRL SS (BLADE) ×1
BLADE SURG ROTATE 9660 (MISCELLANEOUS) IMPLANT
BNDG COHESIVE 4X5 WHT NS (GAUZE/BANDAGES/DRESSINGS) IMPLANT
CANISTER SUCTION 1200CC (MISCELLANEOUS) IMPLANT
CHLORAPREP W/TINT 26ML (MISCELLANEOUS) ×2 IMPLANT
CLEANER CAUTERY TIP 5X5 PAD (MISCELLANEOUS) ×1 IMPLANT
CLOTH BEACON ORANGE TIMEOUT ST (SAFETY) ×2 IMPLANT
COVER MAYO STAND STRL (DRAPES) ×2 IMPLANT
COVER TABLE BACK 60X90 (DRAPES) ×2 IMPLANT
DECANTER SPIKE VIAL GLASS SM (MISCELLANEOUS) IMPLANT
DERMABOND ADVANCED (GAUZE/BANDAGES/DRESSINGS)
DERMABOND ADVANCED .7 DNX12 (GAUZE/BANDAGES/DRESSINGS) IMPLANT
DRAPE LAPAROTOMY T 102X78X121 (DRAPES) ×2 IMPLANT
DRAPE UTILITY XL STRL (DRAPES) ×2 IMPLANT
DRSG TEGADERM 4X4.75 (GAUZE/BANDAGES/DRESSINGS) IMPLANT
ELECT REM PT RETURN 9FT ADLT (ELECTROSURGICAL) ×2
ELECTRODE REM PT RTRN 9FT ADLT (ELECTROSURGICAL) ×1 IMPLANT
GLOVE BIO SURGEON STRL SZ 6.5 (GLOVE) ×2 IMPLANT
GLOVE BIO SURGEON STRL SZ7 (GLOVE) ×2 IMPLANT
GLOVE BIOGEL PI IND STRL 7.0 (GLOVE) ×1 IMPLANT
GLOVE BIOGEL PI IND STRL 8 (GLOVE) ×1 IMPLANT
GLOVE BIOGEL PI INDICATOR 7.0 (GLOVE) ×1
GLOVE BIOGEL PI INDICATOR 8 (GLOVE) ×1
GLOVE ECLIPSE 7.5 STRL STRAW (GLOVE) ×2 IMPLANT
GOWN PREVENTION PLUS XLARGE (GOWN DISPOSABLE) ×4 IMPLANT
NEEDLE HYPO 25X1 1.5 SAFETY (NEEDLE) ×2 IMPLANT
NS IRRIG 1000ML POUR BTL (IV SOLUTION) ×2 IMPLANT
PACK BASIN DAY SURGERY FS (CUSTOM PROCEDURE TRAY) ×2 IMPLANT
PAD CLEANER CAUTERY TIP 5X5 (MISCELLANEOUS) ×1
PENCIL BUTTON HOLSTER BLD 10FT (ELECTRODE) ×2 IMPLANT
SHEET MEDIUM DRAPE 40X70 STRL (DRAPES) ×2 IMPLANT
STRIP CLOSURE SKIN 1/2X4 (GAUZE/BANDAGES/DRESSINGS) IMPLANT
STRIP CLOSURE SKIN 1/4X4 (GAUZE/BANDAGES/DRESSINGS) IMPLANT
SUCTION FRAZIER TIP 10 FR DISP (SUCTIONS) IMPLANT
SUT ETHILON 4 0 PS 2 18 (SUTURE) IMPLANT
SUT MON AB 4-0 PC3 18 (SUTURE) IMPLANT
SUT PROLENE 4 0 PS 2 18 (SUTURE) IMPLANT
SUT VIC AB 2-0 SH 27 (SUTURE)
SUT VIC AB 2-0 SH 27XBRD (SUTURE) IMPLANT
SUT VIC AB 3-0 SH 27 (SUTURE)
SUT VIC AB 3-0 SH 27X BRD (SUTURE) IMPLANT
SUT VIC AB 4-0 SH 27 (SUTURE)
SUT VIC AB 4-0 SH 27XANBCTRL (SUTURE) IMPLANT
SYR BULB 3OZ (MISCELLANEOUS) IMPLANT
SYR CONTROL 10ML LL (SYRINGE) ×2 IMPLANT
TOWEL OR 17X24 6PK STRL BLUE (TOWEL DISPOSABLE) ×4 IMPLANT
TOWEL OR NON WOVEN STRL DISP B (DISPOSABLE) ×2 IMPLANT
TUBE CONNECTING 20X1/4 (TUBING) IMPLANT
WATER STERILE IRR 1000ML POUR (IV SOLUTION) IMPLANT
YANKAUER SUCT BULB TIP NO VENT (SUCTIONS) IMPLANT

## 2012-03-17 NOTE — Transfer of Care (Signed)
Immediate Anesthesia Transfer of Care Note  Patient: Rhonda Garcia  Procedure(s) Performed: Procedure(s) (LRB) with comments: EXCISION LIPOMA (Right) - excision of right lower back lipoma  Patient Location: PACU  Anesthesia Type:General  Level of Consciousness: awake, alert  and oriented  Airway & Oxygen Therapy: Patient Spontanous Breathing and Patient connected to face mask oxygen  Post-op Assessment: Report given to PACU RN and Post -op Vital signs reviewed and stable  Post vital signs: Reviewed and stable  Complications: No apparent anesthesia complications

## 2012-03-17 NOTE — H&P (Signed)
  The patient comes in with a complaint of lower and upper back pain on the right side. She stresses that she's had this lesion in the right lower back for some years however is recently causing her more discomfort which she attributes to this lesion. Occasionally he gets more swollen than at other times. She has a history of diabetes coronary artery disease a history of a prior myocardial infarction reflux disease hypertension hiatal hernia sleep apnea thyroid disease. Surgical history she's had an appendectomy breast reduction and a prior cholecystectomy and abdominal hysterectomy.  On examination her right lower back she has a mobile deep subcutaneous lesion that measures approximately 4-5 cm in size. It appears to be a lipoma and not a sebaceous cyst.  The patient has some risks for surgery including coronary artery disease and history of myocardial infarction along with diabetes. Because of that she will need cardiac clearance prior to surgery however we will plan on an outpatient excision of this lesion in her right lower back.   This lesion is deep in the right lower.  Difficult to find  Will localized preop and on the table.  Rhonda Garcia. Gae Bon, MD, FACS [phone] [facility]

## 2012-03-17 NOTE — Anesthesia Postprocedure Evaluation (Signed)
  Anesthesia Post-op Note  Patient: Rhonda Garcia   Procedure(s) Performed: Procedure(s) (LRB) with comments: EXCISION LIPOMA (Right) - excision of right lower back lipoma  Patient Location: PACU  Anesthesia Type:General  Level of Consciousness: awake  Airway and Oxygen Therapy: Patient Spontanous Breathing  Post-op Pain: mild  Post-op Assessment: Post-op Vital signs reviewed  Post-op Vital Signs: stable  Complications: No apparent anesthesia complications

## 2012-03-17 NOTE — Anesthesia Preprocedure Evaluation (Signed)
Anesthesia Evaluation  Patient identified by MRN, date of birth, ID band Patient awake    Reviewed: Allergy & Precautions, H&P , NPO status , Patient's Chart, lab work & pertinent test results  Airway Mallampati: I  Neck ROM: Full    Dental  (+) Edentulous Upper   Pulmonary sleep apnea ,  breath sounds clear to auscultation        Cardiovascular hypertension, + CAD, + Past MI and + Cardiac Stents Rhythm:Regular Rate:Normal     Neuro/Psych  Headaches,  Neuromuscular disease    GI/Hepatic hiatal hernia, GERD-  ,  Endo/Other  diabetes, Well Controlled, Type 1  Renal/GU      Musculoskeletal negative musculoskeletal ROS (+)   Abdominal   Peds  Hematology negative hematology ROS (+)   Anesthesia Other Findings   Reproductive/Obstetrics                           Anesthesia Physical Anesthesia Plan  ASA: III  Anesthesia Plan: MAC   Post-op Pain Management:    Induction: Intravenous  Airway Management Planned: Simple Face Mask  Additional Equipment:   Intra-op Plan:   Post-operative Plan:   Informed Consent:   Plan Discussed with: CRNA and Surgeon  Anesthesia Plan Comments:         Anesthesia Quick Evaluation

## 2012-03-17 NOTE — Op Note (Signed)
OPERATIVE REPORT  DATE OF OPERATION: 03/17/2012  PATIENT:  Rhonda Garcia  71 y.o. female  PRE-OPERATIVE DIAGNOSIS:  symptomatic back lipoma  POST-OPERATIVE DIAGNOSIS:  Lipoma of right lower back  PROCEDURE:  Procedure(s): EXCISION LIPOMA  SURGEON:  Surgeon(s): Cherylynn Ridges, MD  ASSISTANT: None  ANESTHESIA:   general  EBL: <20 ml  BLOOD ADMINISTERED: none  DRAINS: none   SPECIMEN:  Source of Specimen:  lower back lipoma  COUNTS CORRECT:  YES  PROCEDURE DETAILS: The patient was taken to the operating room and placed on the table initially in the supine position. After an adequate general endotracheal anesthetic was administered she was flipped into the prone position and her lower back prepped and draped in usual sterile manner exposing the area of the palpable mass.  The mass was palpated in the right lower back. It appeared to be soft and mobile. A transverse incision was made using a #15 blade and taken down to subcutaneous tissue using electrocautery. We subsequently dissected down to the palpable mass which turned out to be a lipoma measuring approximately 3-4 cm in size. An almost had the shape of fascia.  Once it was removed the wound was irrigated with saline then the subcutaneous tissue reapproximated using interrupted 3-0 Vicryl sutures. The skin was then closed using running subcuticular stitch of 4-0 Monocryl. Cortisone Marcaine with epi was injected into the wound. Dermabond Steri-Strips and Tegaderms use complete the dressing.  All counts were correct.  PATIENT DISPOSITION:  PACU - hemodynamically stable.   Cherylynn Ridges 12/9/20133:05 PM

## 2012-03-17 NOTE — Anesthesia Procedure Notes (Signed)
Procedure Name: Intubation Date/Time: 03/17/2012 2:30 PM Performed by: Burna Cash Pre-anesthesia Checklist: Patient identified, Emergency Drugs available, Suction available and Patient being monitored Patient Re-evaluated:Patient Re-evaluated prior to inductionOxygen Delivery Method: Circle System Utilized Preoxygenation: Pre-oxygenation with 100% oxygen Intubation Type: IV induction Ventilation: Mask ventilation without difficulty Laryngoscope Size: Mac and 3 Grade View: Grade I Tube type: Oral Tube size: 7.0 mm Number of attempts: 1 Airway Equipment and Method: stylet and oral airway Placement Confirmation: ETT inserted through vocal cords under direct vision,  positive ETCO2 and breath sounds checked- equal and bilateral Secured at: 20 cm Tube secured with: Tape Dental Injury: Teeth and Oropharynx as per pre-operative assessment

## 2012-03-18 ENCOUNTER — Encounter (HOSPITAL_BASED_OUTPATIENT_CLINIC_OR_DEPARTMENT_OTHER): Payer: Self-pay | Admitting: General Surgery

## 2012-04-01 ENCOUNTER — Encounter (INDEPENDENT_AMBULATORY_CARE_PROVIDER_SITE_OTHER): Payer: Self-pay | Admitting: General Surgery

## 2012-04-01 ENCOUNTER — Ambulatory Visit (INDEPENDENT_AMBULATORY_CARE_PROVIDER_SITE_OTHER): Payer: Medicare PPO | Admitting: General Surgery

## 2012-04-01 VITALS — BP 130/84 | HR 72 | Temp 97.3°F | Resp 14 | Ht 62.0 in | Wt 189.4 lb

## 2012-04-01 DIAGNOSIS — Z09 Encounter for follow-up examination after completed treatment for conditions other than malignant neoplasm: Secondary | ICD-10-CM

## 2012-04-01 NOTE — Progress Notes (Signed)
The patient is doing very well status post excision of lower back lipoma. The incision is slightly erythematous but is not draining anything purulent.  There is some mild induration of the incision but this should get better over time. She is doing well enough to see me on a when necessary basis.

## 2012-06-22 ENCOUNTER — Other Ambulatory Visit: Payer: Self-pay | Admitting: *Deleted

## 2012-06-22 DIAGNOSIS — Z780 Asymptomatic menopausal state: Secondary | ICD-10-CM

## 2012-07-28 ENCOUNTER — Encounter: Payer: Self-pay | Admitting: Family Medicine

## 2012-07-28 ENCOUNTER — Ambulatory Visit (INDEPENDENT_AMBULATORY_CARE_PROVIDER_SITE_OTHER): Payer: Medicare Other | Admitting: Family Medicine

## 2012-07-28 VITALS — BP 162/77 | HR 74 | Temp 97.0°F | Ht 64.0 in | Wt 198.6 lb

## 2012-07-28 DIAGNOSIS — E785 Hyperlipidemia, unspecified: Secondary | ICD-10-CM

## 2012-07-28 DIAGNOSIS — K5900 Constipation, unspecified: Secondary | ICD-10-CM

## 2012-07-28 DIAGNOSIS — E669 Obesity, unspecified: Secondary | ICD-10-CM

## 2012-07-28 DIAGNOSIS — F3289 Other specified depressive episodes: Secondary | ICD-10-CM

## 2012-07-28 DIAGNOSIS — K219 Gastro-esophageal reflux disease without esophagitis: Secondary | ICD-10-CM

## 2012-07-28 DIAGNOSIS — F329 Major depressive disorder, single episode, unspecified: Secondary | ICD-10-CM

## 2012-07-28 DIAGNOSIS — I10 Essential (primary) hypertension: Secondary | ICD-10-CM

## 2012-07-28 DIAGNOSIS — F32A Depression, unspecified: Secondary | ICD-10-CM

## 2012-07-28 DIAGNOSIS — M1990 Unspecified osteoarthritis, unspecified site: Secondary | ICD-10-CM

## 2012-07-28 DIAGNOSIS — E119 Type 2 diabetes mellitus without complications: Secondary | ICD-10-CM

## 2012-07-28 DIAGNOSIS — I2510 Atherosclerotic heart disease of native coronary artery without angina pectoris: Secondary | ICD-10-CM

## 2012-07-28 LAB — COMPLETE METABOLIC PANEL WITH GFR
ALT: 15 U/L (ref 0–35)
AST: 17 U/L (ref 0–37)
Albumin: 3.9 g/dL (ref 3.5–5.2)
Alkaline Phosphatase: 66 U/L (ref 39–117)
BUN: 12 mg/dL (ref 6–23)
CO2: 28 mEq/L (ref 19–32)
Calcium: 9.6 mg/dL (ref 8.4–10.5)
Chloride: 107 mEq/L (ref 96–112)
Creat: 0.67 mg/dL (ref 0.50–1.10)
GFR, Est African American: >89 mL/min
GFR, Est Non African American: 89 mL/min
Glucose, Bld: 113 mg/dL — ABNORMAL HIGH (ref 70–99)
Potassium: 4.1 mEq/L (ref 3.5–5.3)
Sodium: 142 mEq/L (ref 135–145)
Total Bilirubin: 0.8 mg/dL (ref 0.3–1.2)
Total Protein: 6.2 g/dL (ref 6.0–8.3)

## 2012-07-28 LAB — POCT GLYCOSYLATED HEMOGLOBIN (HGB A1C): Hemoglobin A1C: 6.5

## 2012-07-28 LAB — POCT UA - MICROALBUMIN: Microalbumin Ur, POC: NEGATIVE mg/dL

## 2012-07-28 MED ORDER — PIOGLITAZONE HCL 30 MG PO TABS: 30.0000 mg | ORAL_TABLET | Freq: Every day | ORAL | Status: DC

## 2012-07-28 MED ORDER — SERTRALINE HCL 50 MG PO TABS: 50.0000 mg | ORAL_TABLET | Freq: Every day | ORAL | Status: DC

## 2012-07-28 NOTE — Patient Instructions (Addendum)
Dr Woodroe Mode Recommendations  Diet and Exercise discussed with patient.  For nutrition information, I recommend books:  1).Eat to Live by Dr Monico Hoar. 2).Prevent and Reverse Heart Disease by Dr Suzzette Righter.  Exercise recommendations are:  If unable to walk, then the patient can exercise in a chair 3 times a day. By flapping arms like a bird gently and raising legs outwards to the front.  If ambulatory, the patient can go for walks for 30 minutes 3 times a week. Then increase the intensity and duration as tolerated.  Goal is to try to attain exercise frequency to 5 times a week.  If applicable: Best to perform resistance exercises (machines or weights) 2 days a week and cardio type exercises 3 days per week.   Diabetes and Exercise Regular exercise is important and can help:   Control blood glucose (sugar).  Decrease blood pressure.    Control blood lipids (cholesterol, triglycerides).  Improve overall health. BENEFITS FROM EXERCISE  Improved fitness.  Improved flexibility.  Improved endurance.  Increased bone density.  Weight control.  Increased muscle strength.  Decreased body fat.  Improvement of the body's use of insulin, a hormone.  Increased insulin sensitivity.  Reduction of insulin needs.  Reduced stress and tension.  Helps you feel better. People with diabetes who add exercise to their lifestyle gain additional benefits, including:  Weight loss.  Reduced appetite.  Improvement of the body's use of blood glucose.  Decreased risk factors for heart disease:  Lowering of cholesterol and triglycerides.  Raising the level of good cholesterol (high-density lipoproteins, HDL).  Lowering blood sugar.  Decreased blood pressure. TYPE 1 DIABETES AND EXERCISE  Exercise will usually lower your blood glucose.  If blood glucose is greater than 240 mg/dl, check urine ketones. If ketones are present, do not exercise.  Location  of the insulin injection sites may need to be adjusted with exercise. Avoid injecting insulin into areas of the body that will be exercised. For example, avoid injecting insulin into:  The arms when playing tennis.  The legs when jogging. For more information, discuss this with your caregiver.  Keep a record of:  Food intake.  Type and amount of exercise.  Expected peak times of insulin action.  Blood glucose levels. Do this before, during, and after exercise. Review your records with your caregiver. This will help you to develop guidelines for adjusting food intake and insulin amounts.  TYPE 2 DIABETES AND EXERCISE  Regular physical activity can help control blood glucose.  Exercise is important because it may:  Increase the body's sensitivity to insulin.  Improve blood glucose control.  Exercise reduces the risk of heart disease. It decreases serum cholesterol and triglycerides. It also lowers blood pressure.  Those who take insulin or oral hypoglycemic agents should watch for signs of hypoglycemia. These signs include dizziness, shaking, sweating, chills, and confusion.  Body water is lost during exercise. It must be replaced. This will help to avoid loss of body fluids (dehydration) or heat stroke. Be sure to talk to your caregiver before starting an exercise program to make sure it is safe for you. Remember, any activity is better than none.  Document Released: 06/16/2003 Document Revised: 06/18/2011 Document Reviewed: 09/30/2008 [facility] Patient Information 2013 [place], [place].    Diabetes and Foot Care Diabetes may cause you to have a poor blood supply (circulation) to your legs and feet. Because of this, the skin may be thinner, break easier, and heal more  slowly. You also may have nerve damage in your legs and feet causing decreased feeling. You may not notice minor injuries to your feet that could lead to serious problems or infections. Taking care of your feet is one  of the most important things you can do for yourself.  HOME CARE INSTRUCTIONS  Do not go barefoot. Bare feet are easily injured.  Check your feet daily for blisters, cuts, and redness.  Wash your feet with warm water (not hot) and mild soap. Pat your feet and between your toes until completely dry.  Apply a moisturizing lotion that does not contain alcohol or petroleum jelly to the dry skin on your feet and to dry brittle toenails. Do not put it between your toes.  Trim your toenails straight across. Do not dig under them or around the cuticle.  Do not cut corns or calluses, or try to remove them with medicine.  Wear clean cotton socks or stockings every day. Make sure they are not too tight. Do not wear knee high stockings since they may decrease blood flow to your legs.  Wear leather shoes that fit properly and have enough cushioning. To break in new shoes, wear them just a few hours a day to avoid injuring your feet.  Wear shoes at all times, even in the house.  Do not cross your legs. This may decrease the blood flow to your feet.  If you find a minor scrape, cut, or break in the skin on your feet, keep it and the skin around it clean and dry. These areas may be cleansed with mild soap and water. Do not use peroxide, alcohol, iodine or Merthiolate.  When you remove an adhesive bandage, be sure not to harm the skin around it.  If you have a wound, look at it several times a day to make sure it is healing.  Do not use heating pads or hot water bottles. Burns can occur. If you have lost feeling in your feet or legs, you may not know it is happening until it is too late.  Report any cuts, sores or bruises to your caregiver. Do not wait! SEEK MEDICAL CARE IF:   You have an injury that is not healing or you notice redness, numbness, burning, or tingling.  Your feet always feel cold.  You have pain or cramps in your legs and feet. SEEK IMMEDIATE MEDICAL CARE IF:   There is  increasing redness, swelling, or increasing pain in the wound.  There is a red line that goes up your leg.  Pus is coming from a wound.  You develop an unexplained oral temperature above 102 F (38.9 C), or as your caregiver suggests.  You notice a bad smell coming from an ulcer or wound. MAKE SURE YOU:   Understand these instructions.  Will watch your condition.  Will get help right away if you are not doing well or get worse. Document Released: 03/23/2000 Document Revised: 06/18/2011 Document Reviewed: 09/29/2008 [facility] Patient Information 2013 [place], [place].    Diabetes and Foot Care Diabetes may cause you to have a poor blood supply (circulation) to your legs and feet. Because of this, the skin may be thinner, break easier, and heal more slowly. You also may have nerve damage in your legs and feet causing decreased feeling. You may not notice minor injuries to your feet that could lead to serious problems or infections. Taking care of your feet is one of the most important things you can do  for yourself.  HOME CARE INSTRUCTIONS  Do not go barefoot. Bare feet are easily injured.  Check your feet daily for blisters, cuts, and redness.  Wash your feet with warm water (not hot) and mild soap. Pat your feet and between your toes until completely dry.  Apply a moisturizing lotion that does not contain alcohol or petroleum jelly to the dry skin on your feet and to dry brittle toenails. Do not put it between your toes.  Trim your toenails straight across. Do not dig under them or around the cuticle.  Do not cut corns or calluses, or try to remove them with medicine.  Wear clean cotton socks or stockings every day. Make sure they are not too tight. Do not wear knee high stockings since they may decrease blood flow to your legs.  Wear leather shoes that fit properly and have enough cushioning. To break in new shoes, wear them just a few hours a day to avoid injuring your  feet.  Wear shoes at all times, even in the house.  Do not cross your legs. This may decrease the blood flow to your feet.  If you find a minor scrape, cut, or break in the skin on your feet, keep it and the skin around it clean and dry. These areas may be cleansed with mild soap and water. Do not use peroxide, alcohol, iodine or Merthiolate.  When you remove an adhesive bandage, be sure not to harm the skin around it.  If you have a wound, look at it several times a day to make sure it is healing.  Do not use heating pads or hot water bottles. Burns can occur. If you have lost feeling in your feet or legs, you may not know it is happening until it is too late.  Report any cuts, sores or bruises to your caregiver. Do not wait! SEEK MEDICAL CARE IF:   You have an injury that is not healing or you notice redness, numbness, burning, or tingling.  Your feet always feel cold.  You have pain or cramps in your legs and feet. SEEK IMMEDIATE MEDICAL CARE IF:   There is increasing redness, swelling, or increasing pain in the wound.  There is a red line that goes up your leg.  Pus is coming from a wound.  You develop an unexplained oral temperature above 102 F (38.9 C), or as your caregiver suggests.  You notice a bad smell coming from an ulcer or wound. MAKE SURE YOU:   Understand these instructions.  Will watch your condition.  Will get help right away if you are not doing well or get worse. Document Released: 03/23/2000 Document Revised: 06/18/2011 Document Reviewed: 09/29/2008 [facility] Patient Information 2013 [place], [place].

## 2012-07-28 NOTE — Progress Notes (Signed)
Patient ID: Rhonda Garcia, female   DOB: February 10, 1941, 72 y.o.   MRN: [id number] SUBJECTIVE: HPI: Patient is here for follow up of Diabetes Mellitus.Symptoms of DM:has had no Nocturia ,deniesUrinary Frequency ,denies Blurred vision ,deniesDizziness,denies.Dysuria,deniesparesthesias, deniesextremity pain or ulcers.Marland Kitchendenieschest pain. .has hadan annual eye exam. do check the feet. doescheck CBGs. Average CBG:________.Marland Kitchen deniesto episodes of hypoglycemia. doeshave an emergency hypoglycemic plan. admits toCompliance with medications. deniesProblems with medications.   PMH/PSH: reviewed/updated in Epic  SH/FH: reviewed/updated in Epic  Allergies: reviewed/updated in Epic  Medications: reviewed/updated in Epic  Immunizations: reviewed/updated in Epic  ROS: As above in the HPI. All other systems are stable or negative.  OBJECTIVE: APPEARANCE:  Patient in no acute distress.The patient appeared well nourished and normally developed. Acyanotic. Waist:42 inches VITAL SIGNS:  SKIN: warm and  Dry without overt rashes, tattoos and scars  HEAD and Neck: without JVD, Head and scalp: normal Eyes:No scleral icterus. Fundi normal, eye movements normal. Ears: Auricle normal, canal normal, Tympanic membranes normal, insufflation normal. Nose: normal Throat: normal Neck & thyroid: normal  CHEST & LUNGS: Chest wall: normal Lungs: Clear  CVS: Reveals the PMI to be normally located. Regular rhythm, First and Second Heart sounds are normal,  absence of murmurs, rubs or gallops. Peripheral vasculature: Radial pulses: normal Dorsal pedis pulses: normal Posterior pulses: normal  ABDOMEN:  Appearance: normal Benign,, no organomegaly, no masses, no Abdominal Aortic enlargement. No Guarding , no rebound. No Bruits. Bowel sounds: normal  RECTAL: N/A GU: N/A  EXTREMETIES: nonedematous. Both Femoral and Pedal pulses are normal.  MUSCULOSKELETAL:  Spine: normal Joints:  intact  NEUROLOGIC: oriented to time,place and person; nonfocal. Strength is normal Sensory is normal Reflexes are normal Cranial Nerves are normal.  ASSESSMENT: Type II or unspecified type diabetes mellitus without mention of complication, not stated as uncontrolled - Plan: pioglitazone (ACTOS) 30 MG tablet, COMPLETE METABOLIC PANEL WITH GFR, POCT glycosylated hemoglobin (Hb A1C), POCT UA - Microalbumin  HLD (hyperlipidemia) - Plan: COMPLETE METABOLIC PANEL WITH GFR, NMR Lipoprofile with Lipids  HTN (hypertension) - Plan: COMPLETE METABOLIC PANEL WITH GFR  Obesity, unspecified  Depression - Plan: sertraline (ZOLOFT) 50 MG tablet  CAD, NATIVE VESSEL  OSTEOARTHRITIS  Gastroesophageal reflux  Constipation    PLAN: Orders Placed This Encounter  Procedures  . COMPLETE METABOLIC PANEL WITH GFR  . NMR Lipoprofile with Lipids  . POCT glycosylated hemoglobin (Hb A1C)  . POCT UA - Microalbumin   Results for orders placed in visit on 07/28/12 (from the past 24 hour(s))  POCT UA - MICROALBUMIN     Status: None   Collection Time    07/28/12 11:03 AM      Result Value Range   Microalbumin Ur, POC neg    POCT GLYCOSYLATED HEMOGLOBIN (HGB A1C)     Status: None   Collection Time    07/28/12 11:06 AM      Result Value Range   Hemoglobin A1C 6.5     Meds ordered this encounter  Medications  . LORazepam (ATIVAN) 0.5 MG tablet    Sig: Take 0.5 mg by mouth every 12 (twelve) hours.  . pioglitazone (ACTOS) 30 MG tablet    Sig: Take 1 tablet (30 mg total) by mouth daily.    Dispense:  90 tablet    Refill:  0  . sertraline (ZOLOFT) 50 MG tablet    Sig: Take 1 tablet (50 mg total) by mouth daily.    Dispense:  90 tablet    Refill:  0  discussed risk benefits of her meds. Diet exercise and lifestyle changes recommended.      Dr Woodroe Mode Recommendations  Diet and Exercise discussed with patient.  For nutrition information, I recommend books:  1).Eat to Live by Dr Monico Hoar. 2).Prevent and Reverse Heart Disease by Dr Suzzette Righter.  Exercise recommendations are:  If unable to walk, then the patient can exercise in a chair 3 times a day. By flapping arms like a bird gently and raising legs outwards to the front.  If ambulatory, the patient can go for walks for 30 minutes 3 times a week. Then increase the intensity and duration as tolerated.  Goal is to try to attain exercise frequency to 5 times a week.  If applicable: Best to perform resistance exercises (machines or weights) 2 days a week and cardio type exercises 3 days per week.  RTC in 3 months,  Jazia Faraci P. [place] Charon, M.D.

## 2012-07-29 LAB — NMR LIPOPROFILE WITH LIPIDS
Cholesterol, Total: 179 mg/dL (ref <200)
HDL Particle Number: 35.7 umol/L (ref >=30.5)
HDL Size: 9.3 nm (ref >=9.2)
HDL-C: 48 mg/dL (ref >=40)
LDL (calc): 113 mg/dL — ABNORMAL HIGH (ref <100)
LDL Particle Number: 1491 nmol/L — ABNORMAL HIGH (ref <1000)
LDL Size: 21.6 nm (ref >20.5)
LP-IR Score: 50 — ABNORMAL HIGH (ref <=45)
Large HDL-P: 6.2 umol/L (ref >=4.8)
Large VLDL-P: 3.4 nmol/L — ABNORMAL HIGH (ref <=2.7)
Small LDL Particle Number: 554 nmol/L — ABNORMAL HIGH (ref <=527)
Triglycerides: 90 mg/dL (ref <150)
VLDL Size: 54.3 nm — ABNORMAL HIGH (ref <=46.6)

## 2012-08-01 ENCOUNTER — Other Ambulatory Visit: Payer: Self-pay | Admitting: Family Medicine

## 2012-08-01 ENCOUNTER — Telehealth: Payer: Self-pay

## 2012-08-01 DIAGNOSIS — E785 Hyperlipidemia, unspecified: Secondary | ICD-10-CM

## 2012-08-01 MED ORDER — EZETIMIBE 10 MG PO TABS: 10.0000 mg | ORAL_TABLET | Freq: Every day | ORAL | Status: DC

## 2012-08-01 NOTE — Telephone Encounter (Signed)
Call zetia to [facility]

## 2012-08-01 NOTE — Telephone Encounter (Signed)
rx called to drug store pt aware

## 2012-08-06 ENCOUNTER — Ambulatory Visit (INDEPENDENT_AMBULATORY_CARE_PROVIDER_SITE_OTHER): Payer: Medicare Other | Admitting: Pharmacist

## 2012-08-06 ENCOUNTER — Ambulatory Visit (INDEPENDENT_AMBULATORY_CARE_PROVIDER_SITE_OTHER): Payer: Medicare Other

## 2012-08-06 ENCOUNTER — Encounter: Payer: Self-pay | Admitting: Pharmacist

## 2012-08-06 VITALS — Ht 61.25 in | Wt 193.0 lb

## 2012-08-06 DIAGNOSIS — M8580 Other specified disorders of bone density and structure, unspecified site: Secondary | ICD-10-CM

## 2012-08-06 DIAGNOSIS — Z13820 Encounter for screening for osteoporosis: Secondary | ICD-10-CM

## 2012-08-06 DIAGNOSIS — Z780 Asymptomatic menopausal state: Secondary | ICD-10-CM

## 2012-08-06 DIAGNOSIS — M899 Disorder of bone, unspecified: Secondary | ICD-10-CM

## 2012-08-06 LAB — HM DEXA SCAN

## 2012-08-06 NOTE — Progress Notes (Signed)
Patient ID: Rhonda Garcia, female   DOB: 23-Aug-1940, 72 y.o.   MRN: [id number] [facility] Current Height: Height: 5' 1.25" (155.6 cm)      Max Lifetime Height:  5'3"  Current Weight: Weight: 193 lb (87.544 kg)       Ethnicity:Caucasian    HPI: Does pt already have a diagnosis of:   Osteopenia?  Yes - BMD has always been normal but X-ray from 03/2010 showed osteopenic appearance of bone in left knee  Osteoporosis?  No  Back Pain?  Yes   - history of cyst on lower back (removed surgically 03/2012)    Kyphosis?  No Prior fracture?  No Med(s) for Osteoporosis/Osteopenia:  none Med(s) previously tried for Osteoporosis/Osteopenia:  none                                                             PMH: Age at menopause:  Surgical at around 72 years old Hysterectomy?  Yes Oophorectomy?  Yes HRT? Yes - Former.  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  No Thyroid med?  No History of cancer?  No History of digestive disorders (ie Crohn's)?  Yes - GERD, takes omeprazole Current or previous eating disorders?  No Last Vitamin D Result:  36 (01/2012) Last GFR Result:  89 (07/2012)   FH/SH: Family history of osteoporosis?  No Parent with history of hip fracture?  No Family history of breast cancer?  No Exercise?  No -  trying to increase exercise with [employer] but hasn't attended regularly. Caffeine?  Yes -  2 sodas daily Smoking?  No Alcohol?  No    Calcium Assessment Calcium Intake  # of servings/day  Calcium mg  Milk (8 oz) 1  x  300  = 300mg   Yogurt (4 oz) 0 x  200 = 0  Cheese (1 oz) 0 x  200 = 0  Other Calcium sources   250mg   Ca supplement Women's daily vit = 500mg    Estimated calcium intake per day 1050mg     DEXA Results Date of Test T-Score for [facility] Spine L1-L4 T-Score for Total Left Hip T-Score for Total Right Hip  08/06/2012 1.1 0.2 1.1  03/29/2010 1.2 0.4 0.9             Assessment: Normal BMD result - stable H/O xray with osteopenic  appearance in left knee Low dietary calcium intake  Recommendations:  1.  recommend calcium 1200mg  daily either through supplementation   or diet.   3.  recommend weight bearing exercise - 30 minutes at least 3-4 days   per week.  Patient member with [employer] so will try to go more regularly 4.  Counseled and educated about fall risk and prevention.  Recheck DEXA:  2 -3 years  Time spent counseling patient:  15 minutes   Henrene Pastor, PharmD, CPP

## 2012-08-06 NOTE — Patient Instructions (Addendum)
  Recommend daily calcium intake of 1200mg  Start weight bearing exercise / [employer] - 3 time per week  Fall Prevention, Elderly Falls are the leading cause of injuries, accidents, and accidental deaths in people over the age of 23. Falling is a real threat to your ability to live on your own. CAUSES   Poor eyesight or poor hearing can make you more likely to fall.   Illnesses and physical conditions can affect your strength and balance.   Poor lighting, throw rugs and pets in your home can make you more likely to trip or slip.   The side effects of some medicines can upset your balance and lead to falling. These include medicines for depression, sleep problems, high blood pressure, diabetes, and heart conditions.  PREVENTION  Be sure your home is as safe as possible. Here are some tips:  Wear shoes with non-skid soles (not house slippers).   Be sure your home and outside area are well lit.   Use night lights throughout your house, including hallways and stairways.   Remove clutter and clean up spills on floors and walkways.   Remove throw rugs or fasten them to the floor with carpet tape. Tack down carpet edges.   Do not place electrical cords across pathways.   Install grab bars in your bathtub, shower, and toilet area. Towel bars should not be used as a grab bar.   Install handrails on both sides of stairways.   Do not climb on stools or stepladders. Get someone else to help with jobs that require climbing.   Do not wax your floors at all, or use a non-skid wax.   Repair uneven or unsafe sidewalks, walkways or stairs.   Keep frequently used items within reach.   Be aware of pets so you do not trip.  Get regular check-ups from your doctor, and take good care of yourself:  Have your eyes checked every year for vision changes, cataracts, glaucoma, and other eye problems. Wear eyeglasses as directed.   Have your hearing checked every 2 years, or anytime you or  others think that you cannot hear well. Use hearing aids as directed.   See your caregiver if you have foot pain or corns. Sore feet can contribute to falls.   Let your caregiver know if a medicine is making you feel dizzy or making you lose your balance.   Use a cane, walker, or wheelchair as directed. Use walker or wheelchair brakes when getting in and out.   When you get up from bed, sit on the side of the bed for 1 to 2 minutes before you stand up. This will give your blood pressure time to adjust, and you will feel less dizzy.   If you need to go to the bathroom often, consider using a bedside commode.  Keep your body in good shape:  Get regular exercise, especially walking.   Do exercises to strengthen the muscles you use for walking and lifting.   Do not smoke.   Minimize use of alcohol.  SEEK IMMEDIATE MEDICAL CARE IF:   You feel dizzy, weak, or unsteady on your feet.   You feel confused.   You fall.  Document Released: 03/26/2005 Document Revised: 03/15/2011 Document Reviewed: 09/20/2006 [facility] Patient Information 2012 [place], [place].

## 2012-10-28 ENCOUNTER — Ambulatory Visit (INDEPENDENT_AMBULATORY_CARE_PROVIDER_SITE_OTHER): Payer: Medicare Other | Admitting: Family Medicine

## 2012-10-28 ENCOUNTER — Encounter: Payer: Self-pay | Admitting: Family Medicine

## 2012-10-28 VITALS — BP 166/76 | HR 56 | Temp 97.6°F | Wt 193.6 lb

## 2012-10-28 DIAGNOSIS — F329 Major depressive disorder, single episode, unspecified: Secondary | ICD-10-CM

## 2012-10-28 DIAGNOSIS — B354 Tinea corporis: Secondary | ICD-10-CM | POA: Insufficient documentation

## 2012-10-28 DIAGNOSIS — E119 Type 2 diabetes mellitus without complications: Secondary | ICD-10-CM

## 2012-10-28 DIAGNOSIS — Z1212 Encounter for screening for malignant neoplasm of rectum: Secondary | ICD-10-CM

## 2012-10-28 DIAGNOSIS — E785 Hyperlipidemia, unspecified: Secondary | ICD-10-CM | POA: Insufficient documentation

## 2012-10-28 DIAGNOSIS — F32A Depression, unspecified: Secondary | ICD-10-CM | POA: Insufficient documentation

## 2012-10-28 DIAGNOSIS — E1165 Type 2 diabetes mellitus with hyperglycemia: Secondary | ICD-10-CM | POA: Insufficient documentation

## 2012-10-28 DIAGNOSIS — F3289 Other specified depressive episodes: Secondary | ICD-10-CM

## 2012-10-28 LAB — COMPLETE METABOLIC PANEL WITH GFR
ALT: 19 U/L (ref 0–35)
AST: 18 U/L (ref 0–37)
Albumin: 4.2 g/dL (ref 3.5–5.2)
Alkaline Phosphatase: 76 U/L (ref 39–117)
BUN: 10 mg/dL (ref 6–23)
CO2: 27 mEq/L (ref 19–32)
Calcium: 9.8 mg/dL (ref 8.4–10.5)
Chloride: 107 mEq/L (ref 96–112)
Creat: 0.62 mg/dL (ref 0.50–1.10)
GFR, Est African American: >89 mL/min
GFR, Est Non African American: >89 mL/min
Glucose, Bld: 110 mg/dL — ABNORMAL HIGH (ref 70–99)
Potassium: 4.5 mEq/L (ref 3.5–5.3)
Sodium: 143 mEq/L (ref 135–145)
Total Bilirubin: 0.8 mg/dL (ref 0.3–1.2)
Total Protein: 6.5 g/dL (ref 6.0–8.3)

## 2012-10-28 LAB — POCT GLYCOSYLATED HEMOGLOBIN (HGB A1C): Hemoglobin A1C: 6.5

## 2012-10-28 LAB — POCT UA - MICROALBUMIN: Microalbumin Ur, POC: POSITIVE mg/L

## 2012-10-28 MED ORDER — METFORMIN HCL 500 MG PO TABS: 1000.0000 mg | ORAL_TABLET | Freq: Every day | ORAL | Status: DC

## 2012-10-28 MED ORDER — EZETIMIBE 10 MG PO TABS: 10.0000 mg | ORAL_TABLET | Freq: Every day | ORAL | Status: DC

## 2012-10-28 MED ORDER — PRAVASTATIN SODIUM 40 MG PO TABS: 40.0000 mg | ORAL_TABLET | Freq: Every day | ORAL | Status: DC

## 2012-10-28 MED ORDER — METOPROLOL TARTRATE 50 MG PO TABS: 50.0000 mg | ORAL_TABLET | Freq: Two times a day (BID) | ORAL | Status: DC

## 2012-10-28 MED ORDER — KETOCONAZOLE 2 % EX CREA: TOPICAL_CREAM | Freq: Two times a day (BID) | CUTANEOUS | Status: AC

## 2012-10-28 MED ORDER — LISINOPRIL-HYDROCHLOROTHIAZIDE 20-25 MG PO TABS: 1.0000 | ORAL_TABLET | Freq: Every day | ORAL | Status: DC

## 2012-10-28 MED ORDER — AMLODIPINE BESYLATE 5 MG PO TABS: 5.0000 mg | ORAL_TABLET | Freq: Every day | ORAL | Status: DC

## 2012-10-28 MED ORDER — SERTRALINE HCL 50 MG PO TABS: 50.0000 mg | ORAL_TABLET | Freq: Every day | ORAL | Status: DC

## 2012-10-28 MED ORDER — LORAZEPAM 0.5 MG PO TABS: 0.5000 mg | ORAL_TABLET | Freq: Two times a day (BID) | ORAL | Status: DC

## 2012-10-28 MED ORDER — HYDROCODONE-ACETAMINOPHEN 5-325 MG PO TABS: 1.0000 | ORAL_TABLET | Freq: Four times a day (QID) | ORAL | Status: DC | PRN

## 2012-10-28 MED ORDER — PIOGLITAZONE HCL 30 MG PO TABS: 30.0000 mg | ORAL_TABLET | Freq: Every day | ORAL | Status: DC

## 2012-10-28 NOTE — Progress Notes (Signed)
Patient ID: Rhonda Garcia, female   DOB: 20-Dec-1940, 72 y.o.   MRN: [id number] SUBJECTIVE: CC: Chief Complaint  Patient presents with  . Follow-up    3 month follow up fasting  CK RASH ON LEFT ARM ? SUMAK  . Medication Refill    NEEDS 90 DAY SUPPLY    HPI: Patient is here for follow up of Diabetes Mellitus/hypertension/hyperlipidemia: Symptoms of DM: Denies Nocturia ,Denies Urinary Frequency , denies Blurred vision ,deniesDizziness,denies.Dysuria,denies paresthesias, denies extremity pain or ulcers.Marland Kitchendenies chest pain. has had an annual eye exam. do check the feet. Does check CBGs. Average OZH:YQMVHQIONG 110-120s Denies episodes of hypoglycemia. Does have an emergency hypoglycemic plan. admits toCompliance with medications. Denies Problems with medications.  Has been in [place] in the yard . Had a rash on both arms 2 to 3 weeks ago and she used topicals OTC and it is better. Almost gone.  Past Medical History  Diagnosis Date  . Diabetes mellitus   . CAD (coronary artery disease)   . MI (myocardial infarction)   . GERD (gastroesophageal reflux disease)   . Osteopenia   . Hypertension   . Hyperlipidemia   . Constipation   . Anal fissure     resolved   . Chronic pain   . Osteoarthritis   . DDD (degenerative disc disease)   . Anemia   . Airway compromise     Inability to tolerate continuous positive airway pressure  . Hiatal hernia   . Dyslipidemia   . Sleep apnea   . Osteoporosis   . Thyroid disease   . Hearing loss   . Generalized headaches   . Rectal pain   . Change in voice   . Sebaceous cyst     on back  . Back pain     with left radiculopathy  . H/O sleep apnea   . Hx of myocardial infarction   . Depression   . Hx of peptic ulcer    Past Surgical History  Procedure Laterality Date  . Left knee repair    . Appendectomy    . Complete hysterectomy    . Breast reduction surgery    . Esophagogastroduodenoscopy  06/2007    Small hiatal hernia  .  Colonoscopy  06/2007    Friable anal canal and anal papillae. Pancolonic diverticula. Normal terminal ileum. Status post segmental biopsy, descending colon biopsy showed minimal cryptitis. Biopsies felt to be  nonspecific but could be seen with NSAIDs. No features of inflammatory bowel disease. Stool studies were negative.  . Colonoscopy  03/21/2011    Procedure: COLONOSCOPY;  Surgeon: Corbin Ade, MD;  Location: [facility];  Service: Endoscopy;  Laterality: N/A;  12:30  . Cholecystectomy  2008 - approximate  . Abdominal hysterectomy  2001 - approximate  . Lipoma excision  03/17/2012    Procedure: EXCISION LIPOMA;  Surgeon: Cherylynn Ridges, MD;  Location: [facility];  Service: General;  Laterality: Right;  excision of right lower back lipoma  . Coronary angioplasty with stent placement    . Knee surgery      left knee repair   History   Social History  . Marital Status: Married    Spouse Name: N/A    Number of Children: 3  . Years of Education: N/A   Occupational History  . Retired    Social History Main Topics  . Smoking status: Former Smoker -- 0.50 packs/day for 0 years    Types: Cigarettes    Quit  date: 03/13/1981  . Smokeless tobacco: Never Used     Comment: quit 30 +years ago  . Alcohol Use: No  . Drug Use: No  . Sexually Active: Not on file   Other Topics Concern  . Not on file   Social History Narrative   Married   Family History  Problem Relation Age of Onset  . Heart attack Mother   . Hypertension Mother   . Stroke Mother     Deceased, age 86  . Stroke Sister   . Diabetes Sister   . Diabetes Sister   . Cancer Sister     colon cancer, dx at age 75, recurrent this year  . Diabetes Brother     also has a blood disorder   . Melanoma Brother     deceased  . Coronary artery disease Other   . Kidney disease Other   . Colon cancer Neg Hx    Current Outpatient Prescriptions on File Prior to Visit  Medication Sig Dispense Refill  . glucose  blood test strip 1 each by Other route 2 (two) times daily. Use as instructed       . isosorbide mononitrate (IMDUR) 30 MG 24 hr tablet Take 30 mg by mouth daily.      . Lancets MISC by Does not apply route 2 (two) times daily.        . nitroGLYCERIN (NITROSTAT) 0.4 MG SL tablet Place 0.4 mg under the tongue every 5 (five) minutes as needed.        . NON FORMULARY Take 1,500 mg by mouth every other day. Lipozene      . Alcohol Swabs 70 % PADS by Does not apply route 2 (two) times daily.        Marland Kitchen aspirin 81 MG tablet Take 81 mg by mouth daily.      . Blood Glucose Monitoring Suppl (GLUCOCOM MONITOR) W/DEVICE KIT by Does not apply route 2 (two) times daily.        . pantoprazole (PROTONIX) 40 MG tablet Take 1 tablet (40 mg total) by mouth daily.  30 tablet  1  . polyethylene glycol powder (CLEARLAX) powder Take 17 g by mouth as needed.       No current facility-administered medications on file prior to visit.   Allergies  Allergen Reactions  . Promethazine Hcl Anaphylaxis  . Penicillins   . Promethazine Hcl Other (See Comments)    There is no immunization history on file for this patient. Prior to Admission medications   Medication Sig Start Date End Date Taking? Authorizing Provider  ezetimibe (ZETIA) 10 MG tablet Take 1 tablet (10 mg total) by mouth daily. 10/28/12  Yes Ileana Ladd, MD  glucose blood test strip 1 each by Other route 2 (two) times daily. Use as instructed    Yes Historical Provider, MD  HYDROcodone-acetaminophen (LORCET) 5-325 MG per tablet Take 1 tablet by mouth every 6 (six) hours as needed for pain. 10/28/12  Yes Ileana Ladd, MD  isosorbide mononitrate (IMDUR) 30 MG 24 hr tablet Take 30 mg by mouth daily.   Yes Historical Provider, MD  Lancets MISC by Does not apply route 2 (two) times daily.     Yes Historical Provider, MD  lisinopril-hydrochlorothiazide (PRINZIDE,ZESTORETIC) 20-25 MG per tablet Take 1 tablet by mouth daily. 10/28/12  Yes Ileana Ladd, MD   LORazepam (ATIVAN) 0.5 MG tablet Take 1 tablet (0.5 mg total) by mouth every 12 (twelve) hours. 10/28/12  Yes Thelma Barge  Casimiro Needle, MD  metFORMIN (GLUCOPHAGE) 500 MG tablet Take 2 tablets (1,000 mg total) by mouth daily with breakfast. 10/28/12  Yes Ileana Ladd, MD  metoprolol (LOPRESSOR) 50 MG tablet Take 1 tablet (50 mg total) by mouth 2 (two) times daily. 10/28/12  Yes Ileana Ladd, MD  nitroGLYCERIN (NITROSTAT) 0.4 MG SL tablet Place 0.4 mg under the tongue every 5 (five) minutes as needed.     Yes Historical Provider, MD  NON FORMULARY Take 1,500 mg by mouth every other day. Lipozene   Yes Historical Provider, MD  pravastatin (PRAVACHOL) 40 MG tablet Take 1 tablet (40 mg total) by mouth at bedtime. 10/28/12  Yes Ileana Ladd, MD  sertraline (ZOLOFT) 50 MG tablet Take 1 tablet (50 mg total) by mouth daily. 10/28/12  Yes Ileana Ladd, MD  Alcohol Swabs 70 % PADS by Does not apply route 2 (two) times daily.      Historical Provider, MD  amLODipine (NORVASC) 5 MG tablet Take 1 tablet (5 mg total) by mouth daily. 10/28/12   Ileana Ladd, MD  aspirin 81 MG tablet Take 81 mg by mouth daily.    Historical Provider, MD  Blood Glucose Monitoring Suppl (GLUCOCOM MONITOR) W/DEVICE KIT by Does not apply route 2 (two) times daily.      Historical Provider, MD  ketoconazole (NIZORAL) 2 % cream Apply topically 2 (two) times daily. 10/28/12 11/11/12  Ileana Ladd, MD  pantoprazole (PROTONIX) 40 MG tablet Take 1 tablet (40 mg total) by mouth daily. 07/19/11 07/18/12  Nira Retort, NP  pioglitazone (ACTOS) 30 MG tablet Take 1 tablet (30 mg total) by mouth daily. 10/28/12   Ileana Ladd, MD  polyethylene glycol powder (CLEARLAX) powder Take 17 g by mouth as needed.    Historical Provider, MD   ROS: As above in the HPI. All other systems are stable or negative.  OBJECTIVE: APPEARANCE:  Patient in no acute distress.The patient appeared well nourished and normally developed. Acyanotic. Waist: VITAL SIGNS:BP  166/76  Pulse 56  Temp(Src) 97.6 F (36.4 C) (Oral)  Wt 193 lb 9.6 oz (87.816 kg)  BMI 36.27 kg/m2 Obese WF Recheck BP 145/90 Patient had not taken her BP meds today as yet. Came fasting for labs and claims BP controlled at home.  SKIN: warm and  Dry without overt rashes, tattoos and scars. Hyperpigmentation of the arms and forearms bilaterally from the contact dermatitis.  HEAD and Neck: without JVD, Head and scalp: normal Eyes:No scleral icterus. Fundi normal, eye movements normal. Ears: Auricle normal, canal normal, Tympanic membranes normal, insufflation normal. Nose: normal Throat: normal Neck & thyroid: normal  CHEST & LUNGS: Chest wall: normal Lungs: Clear  CVS: Reveals the PMI to be normally located. Regular rhythm, First and Second Heart sounds are normal,  absence of murmurs, rubs or gallops. Peripheral vasculature: Radial pulses: normal Dorsal pedis pulses: normal Posterior pulses: normal  ABDOMEN:  Appearance: obese umbilical redness and crack in the skin of the umbilicus. Benign, no organomegaly, no masses, no Abdominal Aortic enlargement. No Guarding , no rebound. No Bruits. Bowel sounds: normal  RECTAL: N/A GU: N/A  EXTREMETIES: nonedematous. Both Femoral and Pedal pulses are normal.  MUSCULOSKELETAL:  Spine: normal Joints: intact  NEUROLOGIC: oriented to time,place and person; nonfocal. Strength is normal Sensory is normal Reflexes are normal Cranial Nerves are normal.  ASSESSMENT: Type II or unspecified type diabetes mellitus without mention of complication, not stated as uncontrolled - Plan: POCT glycosylated hemoglobin (  Hb A1C), POCT UA - Microalbumin, COMPLETE METABOLIC PANEL WITH GFR, pioglitazone (ACTOS) 30 MG tablet, Microalbumin, urine  Depression - Plan: sertraline (ZOLOFT) 50 MG tablet  HLD (hyperlipidemia) - Plan: COMPLETE METABOLIC PANEL WITH GFR, NMR Lipoprofile with Lipids  Tinea corporis - Plan: ketoconazole (NIZORAL) 2 %  cream  Screening for malignant neoplasm of the rectum  tineal infection of the umbilicus area.  PLAN:  Orders Placed This Encounter  Procedures  . COMPLETE METABOLIC PANEL WITH GFR  . NMR Lipoprofile with Lipids  . Microalbumin, urine  . POCT glycosylated hemoglobin (Hb A1C)  . POCT UA - Microalbumin    Meds ordered this encounter  Medications  . DISCONTD: omeprazole (PRILOSEC) 40 MG capsule    Sig:   . HYDROcodone-acetaminophen (LORCET) 5-325 MG per tablet    Sig: Take 1 tablet by mouth every 6 (six) hours as needed for pain.    Dispense:  90 tablet    Refill:  0  . ezetimibe (ZETIA) 10 MG tablet    Sig: Take 1 tablet (10 mg total) by mouth daily.    Dispense:  30 tablet    Refill:  3  . LORazepam (ATIVAN) 0.5 MG tablet    Sig: Take 1 tablet (0.5 mg total) by mouth every 12 (twelve) hours.    Dispense:  60 tablet    Refill:  1  . amLODipine (NORVASC) 5 MG tablet    Sig: Take 1 tablet (5 mg total) by mouth daily.    Dispense:  90 tablet    Refill:  3  . lisinopril-hydrochlorothiazide (PRINZIDE,ZESTORETIC) 20-25 MG per tablet    Sig: Take 1 tablet by mouth daily.    Dispense:  90 tablet    Refill:  3  . metFORMIN (GLUCOPHAGE) 500 MG tablet    Sig: Take 2 tablets (1,000 mg total) by mouth daily with breakfast.    Dispense:  90 tablet    Refill:  3  . metoprolol (LOPRESSOR) 50 MG tablet    Sig: Take 1 tablet (50 mg total) by mouth 2 (two) times daily.    Dispense:  180 tablet    Refill:  3  . pioglitazone (ACTOS) 30 MG tablet    Sig: Take 1 tablet (30 mg total) by mouth daily.    Dispense:  90 tablet    Refill:  3  . pravastatin (PRAVACHOL) 40 MG tablet    Sig: Take 1 tablet (40 mg total) by mouth at bedtime.    Dispense:  90 tablet    Refill:  3  . sertraline (ZOLOFT) 50 MG tablet    Sig: Take 1 tablet (50 mg total) by mouth daily.    Dispense:  90 tablet    Refill:  3  . ketoconazole (NIZORAL) 2 % cream    Sig: Apply topically 2 (two) times daily.     Dispense:  30 g    Refill:  0   Return in about 3 months (around 01/28/2013) for Recheck medical problems.  Dniyah Grant P. [place] Charon, M.D.

## 2012-10-29 LAB — NMR LIPOPROFILE WITH LIPIDS
Cholesterol, Total: 181 mg/dL (ref <200)
HDL Particle Number: 30.4 umol/L — ABNORMAL LOW (ref >=30.5)
HDL Size: 9 nm — ABNORMAL LOW (ref >=9.2)
HDL-C: 50 mg/dL (ref >=40)
LDL (calc): 112 mg/dL — ABNORMAL HIGH (ref <100)
LDL Particle Number: 1343 nmol/L — ABNORMAL HIGH (ref <1000)
LDL Size: 21 nm (ref >20.5)
LP-IR Score: 56 — ABNORMAL HIGH (ref <=45)
Large HDL-P: 6.3 umol/L (ref >=4.8)
Large VLDL-P: 4 nmol/L — ABNORMAL HIGH (ref <=2.7)
Small LDL Particle Number: 464 nmol/L (ref <=527)
Triglycerides: 97 mg/dL (ref <150)
VLDL Size: 47.7 nm — ABNORMAL HIGH (ref <=46.6)

## 2012-10-29 LAB — MICROALBUMIN, URINE: Microalb, Ur: 0.62 mg/dL (ref 0.00–1.89)

## 2012-10-29 NOTE — Progress Notes (Signed)
Quick Note:  Lab result close to goal but not at goal especially the lipids. I think if she changed her diet and exercise we would not have to increase her medications. So for Now no change in Medications for now. No Change in plans and follow up. ______

## 2012-11-10 ENCOUNTER — Encounter: Payer: Self-pay | Admitting: *Deleted

## 2012-11-26 ENCOUNTER — Telehealth: Payer: Self-pay | Admitting: Family Medicine

## 2012-11-26 NOTE — Telephone Encounter (Signed)
Appt scheduled. Patient aware. 

## 2012-11-27 ENCOUNTER — Ambulatory Visit (INDEPENDENT_AMBULATORY_CARE_PROVIDER_SITE_OTHER): Payer: Medicare Other | Admitting: Family Medicine

## 2012-11-27 ENCOUNTER — Encounter: Payer: Self-pay | Admitting: Family Medicine

## 2012-11-27 VITALS — BP 132/63 | HR 62 | Temp 97.9°F | Ht 61.25 in | Wt 192.4 lb

## 2012-11-27 DIAGNOSIS — R42 Dizziness and giddiness: Secondary | ICD-10-CM

## 2012-11-27 DIAGNOSIS — E538 Deficiency of other specified B group vitamins: Secondary | ICD-10-CM

## 2012-11-27 DIAGNOSIS — K219 Gastro-esophageal reflux disease without esophagitis: Secondary | ICD-10-CM

## 2012-11-27 DIAGNOSIS — R531 Weakness: Secondary | ICD-10-CM

## 2012-11-27 DIAGNOSIS — R5381 Other malaise: Secondary | ICD-10-CM

## 2012-11-27 DIAGNOSIS — R110 Nausea: Secondary | ICD-10-CM

## 2012-11-27 DIAGNOSIS — R079 Chest pain, unspecified: Secondary | ICD-10-CM

## 2012-11-27 LAB — POCT CBC
Granulocyte percent: 65.3 %G (ref 37–80)
HCT, POC: 42 % (ref 37.7–47.9)
Hemoglobin: 14.2 g/dL (ref 12.2–16.2)
Lymph, poc: 2 (ref 0.6–3.4)
MCH, POC: 29.6 pg (ref 27–31.2)
MCHC: 33.9 g/dL (ref 31.8–35.4)
MCV: 87.4 fL (ref 80–97)
MPV: 9.8 fL (ref 0–99.8)
POC Granulocyte: 4.4 (ref 2–6.9)
POC LYMPH PERCENT: 28.9 %L (ref 10–50)
Platelet Count, POC: 171 10*3/uL (ref 142–424)
RBC: 4.8 M/uL (ref 4.04–5.48)
RDW, POC: 14 %
WBC: 6.8 10*3/uL (ref 4.6–10.2)

## 2012-11-27 LAB — POCT URINALYSIS DIPSTICK
Bilirubin, UA: NEGATIVE
Glucose, UA: NEGATIVE
Ketones, UA: NEGATIVE
Leukocytes, UA: NEGATIVE
Nitrite, UA: NEGATIVE
Protein, UA: NEGATIVE
Spec Grav, UA: 1.02
Urobilinogen, UA: NEGATIVE
pH, UA: 5

## 2012-11-27 LAB — POCT UA - MICROSCOPIC ONLY
Casts, Ur, LPF, POC: NEGATIVE
WBC, Ur, HPF, POC: NEGATIVE
Yeast, UA: NEGATIVE

## 2012-11-27 MED ORDER — RANITIDINE HCL 150 MG PO TABS: 150.0000 mg | ORAL_TABLET | Freq: Two times a day (BID) | ORAL | Status: DC

## 2012-11-27 MED ORDER — ONDANSETRON HCL 8 MG PO TABS: 8.0000 mg | ORAL_TABLET | Freq: Three times a day (TID) | ORAL | Status: DC | PRN

## 2012-11-27 MED ORDER — MECLIZINE HCL 25 MG PO TABS: 25.0000 mg | ORAL_TABLET | Freq: Three times a day (TID) | ORAL | Status: DC | PRN

## 2012-11-27 NOTE — Progress Notes (Signed)
Subjective:    Patient ID: Rhonda Garcia, female    DOB: [birth year], 72 y.o.   MRN: [id number]  HPI This 72 y.o. female presents for evaluation of dizziness, weakness, arthralgias in her neck And her right elbow, generally feels washed out.  She has been having nausea.  She has  Had these sx's 3 days now.  She i.s having some abdominal pain.  She states she has  3 days of constipation and then she has loose stools and this for a month.  She has  Some indigestion.  She feels tired and feels like sleeping all the time.   Review of Systems C/o chest pain and fatigue No SOB, HA, dizziness, vision change, N/V, diarrhea, constipation, dysuria, urinary urgency or frequency, myalgias, arthralgias or rash.     Objective:   Physical Exam Vital signs noted  Well developed well nourished female.  HEENT - Head atraumatic Normocephalic                Eyes - PERRLA, Conjuctiva - clear Sclera- Clear EOMI                Ears - EAC's Wnl TM's Wnl Gross Hearing WNL                Nose - Nares patent                 Throat - oropharanx wnl Respiratory - Lungs CTA bilateral Cardiac - RRR S1 and S2 without murmur GI - Abdomen soft Nontender and bowel sounds active x 4 Extremities - No edema. Neuro - Grossly intact.  EKG - NSR without acute ST-T changes.  Results for orders placed in visit on 11/27/12  POCT CBC      Result Value Range   WBC 6.8  4.6 - 10.2 K/uL   Lymph, poc 2.0  0.6 - 3.4   POC LYMPH PERCENT 28.9  10 - 50 %L   POC Granulocyte 4.4  2 - 6.9   Granulocyte percent 65.3  37 - 80 %G   RBC 4.8  4.04 - 5.48 M/uL   Hemoglobin 14.2  12.2 - 16.2 g/dL   HCT, POC 14.7  82.9 - 47.9 %   MCV 87.4  80 - 97 fL   MCH, POC 29.6  27 - 31.2 pg   MCHC 33.9  31.8 - 35.4 g/dL   RDW, POC 56.2     Platelet Count, POC 171.0  142 - 424 K/uL   MPV 9.8  0 - 99.8 fL  POCT UA - MICROSCOPIC ONLY      Result Value Range   WBC, Ur, HPF, POC neg     RBC, urine, microscopic occ     Bacteria, U  Microscopic occ     Mucus, UA trace     Epithelial cells, urine per micros occ     Crystals, Ur, HPF, POC occ     Casts, Ur, LPF, POC neg     Yeast, UA neg    POCT URINALYSIS DIPSTICK      Result Value Range   Color, UA yellow     Clarity, UA clear     Glucose, UA neg     Bilirubin, UA neg     Ketones, UA neg     Spec Grav, UA 1.020     Blood, UA trace     pH, UA 5.0     Protein, UA neg     Urobilinogen, UA negative  Nitrite, UA neg     Leukocytes, UA Negative        Assessment & Plan:  Chest pain - Plan: EKG 12-Lead, POCT CBC, POCT UA - Microscopic Only, POCT urinalysis dipstick, CMP14+EGFR, TSH, Ambulatory referral to Cardiology.  She is not having active chest pain now but states she has had it on and off for the lastfew days.  Weakness - Plan: EKG 12-Lead, POCT CBC, POCT UA - Microscopic Only, POCT urinalysis dipstick, CMP14+EGFR, TSH, Vitamin B12  B12 deficiency - Plan: ranitidine (ZANTAC) 150 MG tablet  GERD (gastroesophageal reflux disease)  Vertigo - Plan: meclizine (ANTIVERT) 25 MG tablet  Nausea alone - Plan: ondansetron (ZOFRAN) 8 MG tablet  Follow up in one week or prn if sx's worsen

## 2012-11-27 NOTE — Patient Instructions (Addendum)
Chest Pain (Nonspecific) °It is often hard to give a specific diagnosis for the cause of chest pain. There is always a chance that your pain could be related to something serious, such as a heart attack or a blood clot in the lungs. You need to follow up with your caregiver for further evaluation. °CAUSES  °· Heartburn. °· Pneumonia or bronchitis. °· Anxiety or stress. °· Inflammation around your heart (pericarditis) or lung (pleuritis or pleurisy). °· A blood clot in the lung. °· A collapsed lung (pneumothorax). It can develop suddenly on its own (spontaneous pneumothorax) or from injury (trauma) to the chest. °· Shingles infection (herpes zoster virus). °The chest wall is composed of bones, muscles, and cartilage. Any of these can be the source of the pain. °· The bones can be bruised by injury. °· The muscles or cartilage can be strained by coughing or overwork. °· The cartilage can be affected by inflammation and become sore (costochondritis). °DIAGNOSIS  °Lab tests or other studies, such as X-rays, electrocardiography, stress testing, or cardiac imaging, may be needed to find the cause of your pain.  °TREATMENT  °· Treatment depends on what may be causing your chest pain. Treatment may include: °· Acid blockers for heartburn. °· Anti-inflammatory medicine. °· Pain medicine for inflammatory conditions. °· Antibiotics if an infection is present. °· You may be advised to change lifestyle habits. This includes stopping smoking and avoiding alcohol, caffeine, and chocolate. °· You may be advised to keep your head raised (elevated) when sleeping. This reduces the chance of acid going backward from your stomach into your esophagus. °· Most of the time, nonspecific chest pain will improve within 2 to 3 days with rest and mild pain medicine. °HOME CARE INSTRUCTIONS  °· If antibiotics were prescribed, take your antibiotics as directed. Finish them even if you start to feel better. °· For the next few days, avoid physical  activities that bring on chest pain. Continue physical activities as directed. °· Do not smoke. °· Avoid drinking alcohol. °· Only take over-the-counter or prescription medicine for pain, discomfort, or fever as directed by your caregiver. °· Follow your caregiver's suggestions for further testing if your chest pain does not go away. °· Keep any follow-up appointments you made. If you do not go to an appointment, you could develop lasting (chronic) problems with pain. If there is any problem keeping an appointment, you must call to reschedule. °SEEK MEDICAL CARE IF:  °· You think you are having problems from the medicine you are taking. Read your medicine instructions carefully. °· Your chest pain does not go away, even after treatment. °· You develop a rash with blisters on your chest. °SEEK IMMEDIATE MEDICAL CARE IF:  °· You have increased chest pain or pain that spreads to your arm, neck, jaw, back, or abdomen. °· You develop shortness of breath, an increasing cough, or you are coughing up blood. °· You have severe back or abdominal pain, feel nauseous, or vomit. °· You develop severe weakness, fainting, or chills. °· You have a fever. °THIS IS AN EMERGENCY. Do not wait to see if the pain will go away. Get medical help at once. Call your local emergency services (911 in [place] Do not drive yourself to the hospital. °MAKE SURE YOU:  °· Understand these instructions. °· Will watch your condition. °· Will get help right away if you are not doing well or get worse. °Document Released: 01/03/2005 Document Revised: 06/18/2011 Document Reviewed: 10/30/2007 °[employer]® Patient Information ©2014 [employer]   [employer]. ° °

## 2012-11-28 LAB — CMP14+EGFR
ALT: 22 IU/L (ref 0–32)
AST: 19 IU/L (ref 0–40)
Albumin/Globulin Ratio: 2.2 (ref 1.1–2.5)
Albumin: 4.4 g/dL (ref 3.5–4.8)
Alkaline Phosphatase: 88 IU/L (ref 39–117)
BUN/Creatinine Ratio: 21 (ref 11–26)
BUN: 17 mg/dL (ref 8–27)
CO2: 29 mmol/L (ref 18–29)
Calcium: 9.8 mg/dL (ref 8.6–10.2)
Chloride: 100 mmol/L (ref 97–108)
Creatinine, Ser: 0.8 mg/dL (ref 0.57–1.00)
GFR calc Af Amer: 86 mL/min/{1.73_m2} (ref >59)
GFR calc non Af Amer: 74 mL/min/{1.73_m2} (ref >59)
Globulin, Total: 2 g/dL (ref 1.5–4.5)
Glucose: 104 mg/dL — ABNORMAL HIGH (ref 65–99)
Potassium: 4.3 mmol/L (ref 3.5–5.2)
Sodium: 141 mmol/L (ref 134–144)
Total Bilirubin: 0.6 mg/dL (ref 0.0–1.2)
Total Protein: 6.4 g/dL (ref 6.0–8.5)

## 2012-11-28 LAB — VITAMIN B12: Vitamin B-12: 465 pg/mL (ref 211–946)

## 2012-11-28 LAB — TSH: TSH: 1.89 u[IU]/mL (ref 0.450–4.500)

## 2012-12-15 ENCOUNTER — Telehealth: Payer: Self-pay | Admitting: Family Medicine

## 2012-12-15 NOTE — Telephone Encounter (Signed)
appt given for tomorrow with FPW. Left message on machine per pt request with time

## 2012-12-16 ENCOUNTER — Ambulatory Visit (INDEPENDENT_AMBULATORY_CARE_PROVIDER_SITE_OTHER): Payer: Medicare Other | Admitting: Family Medicine

## 2012-12-16 ENCOUNTER — Encounter: Payer: Self-pay | Admitting: Family Medicine

## 2012-12-16 ENCOUNTER — Ambulatory Visit (INDEPENDENT_AMBULATORY_CARE_PROVIDER_SITE_OTHER): Payer: Medicare Other

## 2012-12-16 VITALS — BP 136/76 | HR 65 | Temp 97.1°F | Wt 193.6 lb

## 2012-12-16 DIAGNOSIS — L0291 Cutaneous abscess, unspecified: Secondary | ICD-10-CM

## 2012-12-16 DIAGNOSIS — L0390 Cellulitis, unspecified: Secondary | ICD-10-CM

## 2012-12-16 DIAGNOSIS — E119 Type 2 diabetes mellitus without complications: Secondary | ICD-10-CM

## 2012-12-16 LAB — POCT CBC
Granulocyte percent: 68.5 %G (ref 37–80)
HCT, POC: 37.8 % (ref 37.7–47.9)
Hemoglobin: 12.9 g/dL (ref 12.2–16.2)
Lymph, poc: 1.6 (ref 0.6–3.4)
MCH, POC: 29.9 pg (ref 27–31.2)
MCHC: 34.2 g/dL (ref 31.8–35.4)
MCV: 87.4 fL (ref 80–97)
MPV: 9.6 fL (ref 0–99.8)
POC Granulocyte: 3.8 (ref 2–6.9)
POC LYMPH PERCENT: 28.1 %L (ref 10–50)
Platelet Count, POC: 180 10*3/uL (ref 142–424)
RBC: 4.3 M/uL (ref 4.04–5.48)
RDW, POC: 13.9 %
WBC: 5.6 10*3/uL (ref 4.6–10.2)

## 2012-12-16 LAB — GLUCOSE, POCT (MANUAL RESULT ENTRY): POC Glucose: 121 mg/dl — AB (ref 70–99)

## 2012-12-16 MED ORDER — SULFAMETHOXAZOLE-TMP DS 800-160 MG PO TABS: 1.0000 | ORAL_TABLET | Freq: Two times a day (BID) | ORAL | Status: DC

## 2012-12-16 MED ORDER — CLINDAMYCIN HCL 300 MG PO CAPS: 300.0000 mg | ORAL_CAPSULE | Freq: Three times a day (TID) | ORAL | Status: DC

## 2012-12-16 NOTE — Progress Notes (Signed)
Patient ID: Rhonda Garcia, female   DOB: 07/13/1940, 72 y.o.   MRN: [id number] SUBJECTIVE: CC: Chief Complaint  Patient presents with  . L foot puncture wound    x 7 days, now redness with purulent drainage    HPI: As above. A piece of stick went into the top of the foot and she pulled it out last week. 2 days later it was red and draining and pus. She has squeezed out the pus but it remained swollen and red and sore. She is diabetic and realizes that she has been stubborn about see a provider for care of this injury. Declines Tet tox booster.  Past Medical History  Diagnosis Date  . Diabetes mellitus   . CAD (coronary artery disease)   . MI (myocardial infarction)   . GERD (gastroesophageal reflux disease)   . Osteopenia   . Hypertension   . Hyperlipidemia   . Constipation   . Anal fissure     resolved   . Chronic pain   . Osteoarthritis   . DDD (degenerative disc disease)   . Anemia   . Airway compromise     Inability to tolerate continuous positive airway pressure  . Hiatal hernia   . Dyslipidemia   . Sleep apnea   . Osteoporosis   . Thyroid disease   . Hearing loss   . Generalized headaches   . Rectal pain   . Change in voice   . Sebaceous cyst     on back  . Back pain     with left radiculopathy  . H/O sleep apnea   . Hx of myocardial infarction   . Depression   . Hx of peptic ulcer    Past Surgical History  Procedure Laterality Date  . Left knee repair    . Appendectomy    . Complete hysterectomy    . Breast reduction surgery    . Esophagogastroduodenoscopy  06/2007    Small hiatal hernia  . Colonoscopy  06/2007    Friable anal canal and anal papillae. Pancolonic diverticula. Normal terminal ileum. Status post segmental biopsy, descending colon biopsy showed minimal cryptitis. Biopsies felt to be  nonspecific but could be seen with NSAIDs. No features of inflammatory bowel disease. Stool studies were negative.  . Colonoscopy  03/21/2011    Procedure:  COLONOSCOPY;  Surgeon: Corbin Ade, MD;  Location: [facility];  Service: Endoscopy;  Laterality: N/A;  12:30  . Cholecystectomy  2008 - approximate  . Abdominal hysterectomy  2001 - approximate  . Lipoma excision  03/17/2012    Procedure: EXCISION LIPOMA;  Surgeon: Cherylynn Ridges, MD;  Location: [facility];  Service: General;  Laterality: Right;  excision of right lower back lipoma  . Coronary angioplasty with stent placement    . Knee surgery      left knee repair   History   Social History  . Marital Status: Married    Spouse Name: N/A    Number of Children: 3  . Years of Education: N/A   Occupational History  . Retired    Social History Main Topics  . Smoking status: Former Smoker -- 0.50 packs/day for 0 years    Types: Cigarettes    Quit date: 03/13/1981  . Smokeless tobacco: Never Used     Comment: quit 30 +years ago  . Alcohol Use: No  . Drug Use: No  . Sexual Activity: Not on file   Other Topics Concern  .  Not on file   Social History Narrative   Married   Family History  Problem Relation Age of Onset  . Heart attack Mother   . Hypertension Mother   . Stroke Mother     Deceased, age 14  . Stroke Sister   . Diabetes Sister   . Diabetes Sister   . Cancer Sister     colon cancer, dx at age 56, recurrent this year  . Diabetes Brother     also has a blood disorder   . Melanoma Brother     deceased  . Coronary artery disease Other   . Kidney disease Other   . Colon cancer Neg Hx    Current Outpatient Prescriptions on File Prior to Visit  Medication Sig Dispense Refill  . Alcohol Swabs 70 % PADS by Does not apply route 2 (two) times daily.        Marland Kitchen amLODipine (NORVASC) 5 MG tablet Take 1 tablet (5 mg total) by mouth daily.  90 tablet  3  . aspirin 81 MG tablet Take 81 mg by mouth daily.      . Blood Glucose Monitoring Suppl (GLUCOCOM MONITOR) W/DEVICE KIT by Does not apply route 2 (two) times daily.        Marland Kitchen ezetimibe (ZETIA) 10 MG  tablet Take 1 tablet (10 mg total) by mouth daily.  30 tablet  3  . glucose blood test strip 1 each by Other route 2 (two) times daily. Use as instructed       . HYDROcodone-acetaminophen (LORCET) 5-325 MG per tablet Take 1 tablet by mouth every 6 (six) hours as needed for pain.  90 tablet  0  . isosorbide mononitrate (IMDUR) 30 MG 24 hr tablet Take 30 mg by mouth daily.      Marland Kitchen ketoconazole (NIZORAL) 2 % cream       . Lancets MISC by Does not apply route 2 (two) times daily.        Marland Kitchen lisinopril-hydrochlorothiazide (PRINZIDE,ZESTORETIC) 20-25 MG per tablet Take 1 tablet by mouth daily.  90 tablet  3  . LORazepam (ATIVAN) 0.5 MG tablet Take 1 tablet (0.5 mg total) by mouth every 12 (twelve) hours.  60 tablet  1  . meclizine (ANTIVERT) 25 MG tablet Take 1 tablet (25 mg total) by mouth 3 (three) times daily as needed.  30 tablet  0  . metFORMIN (GLUCOPHAGE) 500 MG tablet Take 2 tablets (1,000 mg total) by mouth daily with breakfast.  90 tablet  3  . metoprolol (LOPRESSOR) 50 MG tablet Take 1 tablet (50 mg total) by mouth 2 (two) times daily.  180 tablet  3  . nitroGLYCERIN (NITROSTAT) 0.4 MG SL tablet Place 0.4 mg under the tongue every 5 (five) minutes as needed.        . NON FORMULARY Take 1,500 mg by mouth every other day. Lipozene      . ondansetron (ZOFRAN) 8 MG tablet Take 1 tablet (8 mg total) by mouth every 8 (eight) hours as needed for nausea.  20 tablet  1  . pantoprazole (PROTONIX) 40 MG tablet Take 1 tablet (40 mg total) by mouth daily.  30 tablet  1  . pioglitazone (ACTOS) 30 MG tablet Take 1 tablet (30 mg total) by mouth daily.  90 tablet  3  . polyethylene glycol powder (CLEARLAX) powder Take 17 g by mouth as needed.      . pravastatin (PRAVACHOL) 40 MG tablet Take 1 tablet (40 mg total) by  mouth at bedtime.  90 tablet  3  . ranitidine (ZANTAC) 150 MG tablet Take 1 tablet (150 mg total) by mouth 2 (two) times daily.  60 tablet  1  . sertraline (ZOLOFT) 50 MG tablet Take 1 tablet (50 mg  total) by mouth daily.  90 tablet  3   No current facility-administered medications on file prior to visit.   Allergies  Allergen Reactions  . Promethazine Hcl Anaphylaxis  . Penicillins   . Promethazine Hcl Other (See Comments)    There is no immunization history on file for this patient. Prior to Admission medications   Medication Sig Start Date End Date Taking? Authorizing Provider  Alcohol Swabs 70 % PADS by Does not apply route 2 (two) times daily.     Yes Historical Provider, MD  amLODipine (NORVASC) 5 MG tablet Take 1 tablet (5 mg total) by mouth daily. 10/28/12  Yes Ileana Ladd, MD  aspirin 81 MG tablet Take 81 mg by mouth daily.   Yes Historical Provider, MD  Blood Glucose Monitoring Suppl (GLUCOCOM MONITOR) W/DEVICE KIT by Does not apply route 2 (two) times daily.     Yes Historical Provider, MD  ezetimibe (ZETIA) 10 MG tablet Take 1 tablet (10 mg total) by mouth daily. 10/28/12  Yes Ileana Ladd, MD  glucose blood test strip 1 each by Other route 2 (two) times daily. Use as instructed    Yes Historical Provider, MD  HYDROcodone-acetaminophen (LORCET) 5-325 MG per tablet Take 1 tablet by mouth every 6 (six) hours as needed for pain. 10/28/12  Yes Ileana Ladd, MD  isosorbide mononitrate (IMDUR) 30 MG 24 hr tablet Take 30 mg by mouth daily.   Yes Historical Provider, MD  ketoconazole (NIZORAL) 2 % cream  10/28/12  Yes Historical Provider, MD  Lancets MISC by Does not apply route 2 (two) times daily.     Yes Historical Provider, MD  lisinopril-hydrochlorothiazide (PRINZIDE,ZESTORETIC) 20-25 MG per tablet Take 1 tablet by mouth daily. 10/28/12  Yes Ileana Ladd, MD  LORazepam (ATIVAN) 0.5 MG tablet Take 1 tablet (0.5 mg total) by mouth every 12 (twelve) hours. 10/28/12  Yes Ileana Ladd, MD  meclizine (ANTIVERT) 25 MG tablet Take 1 tablet (25 mg total) by mouth 3 (three) times daily as needed. 11/27/12  Yes Deatra Canter, FNP  metFORMIN (GLUCOPHAGE) 500 MG tablet Take 2  tablets (1,000 mg total) by mouth daily with breakfast. 10/28/12  Yes Ileana Ladd, MD  metoprolol (LOPRESSOR) 50 MG tablet Take 1 tablet (50 mg total) by mouth 2 (two) times daily. 10/28/12  Yes Ileana Ladd, MD  nitroGLYCERIN (NITROSTAT) 0.4 MG SL tablet Place 0.4 mg under the tongue every 5 (five) minutes as needed.     Yes Historical Provider, MD  NON FORMULARY Take 1,500 mg by mouth every other day. Lipozene   Yes Historical Provider, MD  ondansetron (ZOFRAN) 8 MG tablet Take 1 tablet (8 mg total) by mouth every 8 (eight) hours as needed for nausea. 11/27/12  Yes Deatra Canter, FNP  pantoprazole (PROTONIX) 40 MG tablet Take 1 tablet (40 mg total) by mouth daily. 07/19/11 12/16/12 Yes Nira Retort, NP  pioglitazone (ACTOS) 30 MG tablet Take 1 tablet (30 mg total) by mouth daily. 10/28/12  Yes Ileana Ladd, MD  polyethylene glycol powder (CLEARLAX) powder Take 17 g by mouth as needed.   Yes Historical Provider, MD  pravastatin (PRAVACHOL) 40 MG tablet Take 1 tablet (40 mg  total) by mouth at bedtime. 10/28/12  Yes Ileana Ladd, MD  ranitidine (ZANTAC) 150 MG tablet Take 1 tablet (150 mg total) by mouth 2 (two) times daily. 11/27/12  Yes Deatra Canter, FNP  sertraline (ZOLOFT) 50 MG tablet Take 1 tablet (50 mg total) by mouth daily. 10/28/12  Yes Ileana Ladd, MD  clindamycin (CLEOCIN) 300 MG capsule Take 1 capsule (300 mg total) by mouth 3 (three) times daily. 12/16/12   Ileana Ladd, MD  sulfamethoxazole-trimethoprim (BACTRIM DS) 800-160 MG per tablet Take 1 tablet by mouth 2 (two) times daily. 12/16/12   Ileana Ladd, MD     ROS: As above in the HPI. All other systems are stable or negative.  OBJECTIVE: APPEARANCE:  Patient in no acute distress.The patient appeared well nourished and normally developed. Acyanotic. Waist: VITAL SIGNS:BP 136/76  Pulse 65  Temp(Src) 97.1 F (36.2 C) (Oral)  Wt 193 lb 9.6 oz (87.816 kg)  BMI 36.27 kg/m2  Obese WF  SKIN: warm and  Dry without  overt rashes, tattoos and scars  HEAD and Neck: without JVD, Head and scalp: normal Eyes:No scleral icterus. Fundi normal, eye movements normal. Ears: Auricle normal, canal normal, Tympanic membranes normal, insufflation normal. Nose: normal Throat: normal Neck & thyroid: normal  CHEST & LUNGS: Chest wall: normal Lungs: Clear  CVS: Reveals the PMI to be normally located. Regular rhythm, First and Second Heart sounds are normal,  absence of murmurs, rubs or gallops. Peripheral vasculature: Radial pulses: normal Dorsal pedis pulses: normal Posterior pulses: normal  ABDOMEN:  Appearance:Obese Benign, no organomegaly, no masses, no Abdominal Aortic enlargement. No Guarding , no rebound. No Bruits. Bowel sounds: normal  EXTREMETIES: left foot dorsal aspect has a healed puncture wound over the 4 th metatarsal bone. SEE Photo oin Media  Section.. No drainage but the dorsal foot is swollen with some  Redness and tenderness.  NEUROLOGIC: oriented to time,place and person; nonfocal.  Results for orders placed in visit on 12/16/12  POCT CBC      Result Value Range   WBC 5.6  4.6 - 10.2 K/uL   Lymph, poc 1.6  0.6 - 3.4   POC LYMPH PERCENT 28.1  10 - 50 %L   POC Granulocyte 3.8  2 - 6.9   Granulocyte percent 68.5  37 - 80 %G   RBC 4.3  4.04 - 5.48 M/uL   Hemoglobin 12.9  12.2 - 16.2 g/dL   HCT, POC 21.3  08.6 - 47.9 %   MCV 87.4  80 - 97 fL   MCH, POC 29.9  27 - 31.2 pg   MCHC 34.2  31.8 - 35.4 g/dL   RDW, POC 57.8     Platelet Count, POC 180.0  142 - 424 K/uL   MPV 9.6  0 - 99.8 fL  GLUCOSE, POCT (MANUAL RESULT ENTRY)      Result Value Range   POC Glucose 121 (*) 70 - 99 mg/dl    ASSESSMENT: Cellulitis - Plan: DG Foot Complete Left, POCT CBC, clindamycin (CLEOCIN) 300 MG capsule, sulfamethoxazole-trimethoprim (BACTRIM DS) 800-160 MG per tablet  DM (diabetes mellitus) - Plan: POCT glucose (manual entry)   PLAN: [facility] reading (PRIMARY) by  Dr. Modesto Charon: no fractures seen:  suspicious for a small foreign body adjacent to the distal 5 th metatarsus bone.  Orders Placed This Encounter  Procedures  . DG Foot Complete Left    Order Specific Question:  Reason for Exam (SYMPTOM  OR DIAGNOSIS REQUIRED)  Answer:  L foot injury with redness    Order Specific Question:  Preferred imaging location?    Answer:  Internal  . POCT CBC  . POCT glucose (manual entry)    Meds ordered this encounter  Medications  . clindamycin (CLEOCIN) 300 MG capsule    Sig: Take 1 capsule (300 mg total) by mouth 3 (three) times daily.    Dispense:  30 capsule    Refill:  0  . sulfamethoxazole-trimethoprim (BACTRIM DS) 800-160 MG per tablet    Sig: Take 1 tablet by mouth 2 (two) times daily.    Dispense:  20 tablet    Refill:  0  I was hesitant to give parenteral antibiotics due to her medication allergies.  Discussed risks in a DM and risk for loss of LIMB!  Patient thinks Tet tox up to date but cannot remember. Elevate, applied moist heat. Any acute worsening with fever return of pustular drainage then patient is to proceed to the ED for urgent evaluation for parenteral antibiotics.  Return in about 2 days (around 12/18/2012) for recheck.  Kengo Sturges P. Modesto Charon, M.D.

## 2012-12-18 ENCOUNTER — Encounter: Payer: Self-pay | Admitting: Family Medicine

## 2012-12-18 ENCOUNTER — Ambulatory Visit (INDEPENDENT_AMBULATORY_CARE_PROVIDER_SITE_OTHER): Payer: Medicare Other | Admitting: Family Medicine

## 2012-12-18 VITALS — BP 146/77 | HR 77 | Temp 97.9°F | Wt 193.0 lb

## 2012-12-18 DIAGNOSIS — L03116 Cellulitis of left lower limb: Secondary | ICD-10-CM | POA: Insufficient documentation

## 2012-12-18 DIAGNOSIS — J069 Acute upper respiratory infection, unspecified: Secondary | ICD-10-CM | POA: Insufficient documentation

## 2012-12-18 DIAGNOSIS — L02619 Cutaneous abscess of unspecified foot: Secondary | ICD-10-CM

## 2012-12-18 DIAGNOSIS — K219 Gastro-esophageal reflux disease without esophagitis: Secondary | ICD-10-CM

## 2012-12-18 DIAGNOSIS — Z23 Encounter for immunization: Secondary | ICD-10-CM

## 2012-12-18 MED ORDER — DEXLANSOPRAZOLE 60 MG PO CPDR: 60.0000 mg | DELAYED_RELEASE_CAPSULE | Freq: Every day | ORAL | Status: DC

## 2012-12-18 NOTE — Progress Notes (Signed)
Tolerated tdap without diffiuclty

## 2012-12-18 NOTE — Patient Instructions (Signed)

## 2012-12-18 NOTE — Progress Notes (Signed)
Patient ID: Rhonda Garcia, female   DOB: 06/17/1940, 72 y.o.   MRN: [id number] SUBJECTIVE: CC: Chief Complaint  Patient presents with  . Follow-up    reck left foot area and c/o ?sinus"    HPI: Here to recheck the left foot. Doing better. The redness is better. occasional drainage of purulence. The swelling and pain is a little better. Still not sure about her last tet tox.  Also , started yesterday with nasal congestion. No fever. Scratchy throat. No SOB, no wheezing , no chest pain.   GERD: dexilant works the best and would like a Rx refill.  Past Medical History  Diagnosis Date  . Diabetes mellitus   . CAD (coronary artery disease)   . MI (myocardial infarction)   . GERD (gastroesophageal reflux disease)   . Osteopenia   . Hypertension   . Hyperlipidemia   . Constipation   . Anal fissure     resolved   . Chronic pain   . Osteoarthritis   . DDD (degenerative disc disease)   . Anemia   . Airway compromise     Inability to tolerate continuous positive airway pressure  . Hiatal hernia   . Dyslipidemia   . Sleep apnea   . Osteoporosis   . Thyroid disease   . Hearing loss   . Generalized headaches   . Rectal pain   . Change in voice   . Sebaceous cyst     on back  . Back pain     with left radiculopathy  . H/O sleep apnea   . Hx of myocardial infarction   . Depression   . Hx of peptic ulcer    Past Surgical History  Procedure Laterality Date  . Left knee repair    . Appendectomy    . Complete hysterectomy    . Breast reduction surgery    . Esophagogastroduodenoscopy  06/2007    Small hiatal hernia  . Colonoscopy  06/2007    Friable anal canal and anal papillae. Pancolonic diverticula. Normal terminal ileum. Status post segmental biopsy, descending colon biopsy showed minimal cryptitis. Biopsies felt to be  nonspecific but could be seen with NSAIDs. No features of inflammatory bowel disease. Stool studies were negative.  . Colonoscopy  03/21/2011   Procedure: COLONOSCOPY;  Surgeon: Corbin Ade, MD;  Location: [facility];  Service: Endoscopy;  Laterality: N/A;  12:30  . Cholecystectomy  2008 - approximate  . Abdominal hysterectomy  2001 - approximate  . Lipoma excision  03/17/2012    Procedure: EXCISION LIPOMA;  Surgeon: Cherylynn Ridges, MD;  Location: [facility];  Service: General;  Laterality: Right;  excision of right lower back lipoma  . Coronary angioplasty with stent placement    . Knee surgery      left knee repair   History   Social History  . Marital Status: Married    Spouse Name: N/A    Number of Children: 3  . Years of Education: N/A   Occupational History  . Retired    Social History Main Topics  . Smoking status: Former Smoker -- 0.50 packs/day for 0 years    Types: Cigarettes    Quit date: 03/13/1981  . Smokeless tobacco: Never Used     Comment: quit 30 +years ago  . Alcohol Use: No  . Drug Use: No  . Sexual Activity: Not on file   Other Topics Concern  . Not on file   Social History Narrative  Married   Family History  Problem Relation Age of Onset  . Heart attack Mother   . Hypertension Mother   . Stroke Mother     Deceased, age 43  . Stroke Sister   . Diabetes Sister   . Diabetes Sister   . Cancer Sister     colon cancer, dx at age 39, recurrent this year  . Diabetes Brother     also has a blood disorder   . Melanoma Brother     deceased  . Coronary artery disease Other   . Kidney disease Other   . Colon cancer Neg Hx    Current Outpatient Prescriptions on File Prior to Visit  Medication Sig Dispense Refill  . Alcohol Swabs 70 % PADS by Does not apply route 2 (two) times daily.        Marland Kitchen amLODipine (NORVASC) 5 MG tablet Take 1 tablet (5 mg total) by mouth daily.  90 tablet  3  . aspirin 81 MG tablet Take 81 mg by mouth daily.      . Blood Glucose Monitoring Suppl (GLUCOCOM MONITOR) W/DEVICE KIT by Does not apply route 2 (two) times daily.        . clindamycin  (CLEOCIN) 300 MG capsule Take 1 capsule (300 mg total) by mouth 3 (three) times daily.  30 capsule  0  . ezetimibe (ZETIA) 10 MG tablet Take 1 tablet (10 mg total) by mouth daily.  30 tablet  3  . glucose blood test strip 1 each by Other route 2 (two) times daily. Use as instructed       . HYDROcodone-acetaminophen (LORCET) 5-325 MG per tablet Take 1 tablet by mouth every 6 (six) hours as needed for pain.  90 tablet  0  . isosorbide mononitrate (IMDUR) 30 MG 24 hr tablet Take 30 mg by mouth daily.      Marland Kitchen ketoconazole (NIZORAL) 2 % cream       . Lancets MISC by Does not apply route 2 (two) times daily.        Marland Kitchen lisinopril-hydrochlorothiazide (PRINZIDE,ZESTORETIC) 20-25 MG per tablet Take 1 tablet by mouth daily.  90 tablet  3  . LORazepam (ATIVAN) 0.5 MG tablet Take 1 tablet (0.5 mg total) by mouth every 12 (twelve) hours.  60 tablet  1  . meclizine (ANTIVERT) 25 MG tablet Take 1 tablet (25 mg total) by mouth 3 (three) times daily as needed.  30 tablet  0  . metFORMIN (GLUCOPHAGE) 500 MG tablet Take 2 tablets (1,000 mg total) by mouth daily with breakfast.  90 tablet  3  . metoprolol (LOPRESSOR) 50 MG tablet Take 1 tablet (50 mg total) by mouth 2 (two) times daily.  180 tablet  3  . nitroGLYCERIN (NITROSTAT) 0.4 MG SL tablet Place 0.4 mg under the tongue every 5 (five) minutes as needed.        . NON FORMULARY Take 1,500 mg by mouth every other day. Lipozene      . ondansetron (ZOFRAN) 8 MG tablet Take 1 tablet (8 mg total) by mouth every 8 (eight) hours as needed for nausea.  20 tablet  1  . pioglitazone (ACTOS) 30 MG tablet Take 1 tablet (30 mg total) by mouth daily.  90 tablet  3  . polyethylene glycol powder (CLEARLAX) powder Take 17 g by mouth as needed.      . pravastatin (PRAVACHOL) 40 MG tablet Take 1 tablet (40 mg total) by mouth at bedtime.  90 tablet  3  . ranitidine (ZANTAC) 150 MG tablet Take 1 tablet (150 mg total) by mouth 2 (two) times daily.  60 tablet  1  . sertraline (ZOLOFT) 50  MG tablet Take 1 tablet (50 mg total) by mouth daily.  90 tablet  3  . sulfamethoxazole-trimethoprim (BACTRIM DS) 800-160 MG per tablet Take 1 tablet by mouth 2 (two) times daily.  20 tablet  0  . pantoprazole (PROTONIX) 40 MG tablet Take 1 tablet (40 mg total) by mouth daily.  30 tablet  1   No current facility-administered medications on file prior to visit.   Allergies  Allergen Reactions  . Promethazine Hcl Anaphylaxis  . Penicillins   . Promethazine Hcl Other (See Comments)   Immunization History  Administered Date(s) Administered  . Tdap 12/18/2012   Prior to Admission medications   Medication Sig Start Date End Date Taking? Authorizing Provider  Alcohol Swabs 70 % PADS by Does not apply route 2 (two) times daily.     Yes Historical Provider, MD  amLODipine (NORVASC) 5 MG tablet Take 1 tablet (5 mg total) by mouth daily. 10/28/12  Yes Ileana Ladd, MD  aspirin 81 MG tablet Take 81 mg by mouth daily.   Yes Historical Provider, MD  Blood Glucose Monitoring Suppl (GLUCOCOM MONITOR) W/DEVICE KIT by Does not apply route 2 (two) times daily.     Yes Historical Provider, MD  clindamycin (CLEOCIN) 300 MG capsule Take 1 capsule (300 mg total) by mouth 3 (three) times daily. 12/16/12  Yes Ileana Ladd, MD  dexlansoprazole (DEXILANT) 60 MG capsule Take 1 capsule (60 mg total) by mouth daily. 12/18/12  Yes Ileana Ladd, MD  ezetimibe (ZETIA) 10 MG tablet Take 1 tablet (10 mg total) by mouth daily. 10/28/12  Yes Ileana Ladd, MD  glucose blood test strip 1 each by Other route 2 (two) times daily. Use as instructed    Yes Historical Provider, MD  HYDROcodone-acetaminophen (LORCET) 5-325 MG per tablet Take 1 tablet by mouth every 6 (six) hours as needed for pain. 10/28/12  Yes Ileana Ladd, MD  isosorbide mononitrate (IMDUR) 30 MG 24 hr tablet Take 30 mg by mouth daily.   Yes Historical Provider, MD  ketoconazole (NIZORAL) 2 % cream  10/28/12  Yes Historical Provider, MD  Lancets MISC by Does  not apply route 2 (two) times daily.     Yes Historical Provider, MD  lisinopril-hydrochlorothiazide (PRINZIDE,ZESTORETIC) 20-25 MG per tablet Take 1 tablet by mouth daily. 10/28/12  Yes Ileana Ladd, MD  LORazepam (ATIVAN) 0.5 MG tablet Take 1 tablet (0.5 mg total) by mouth every 12 (twelve) hours. 10/28/12  Yes Ileana Ladd, MD  meclizine (ANTIVERT) 25 MG tablet Take 1 tablet (25 mg total) by mouth 3 (three) times daily as needed. 11/27/12  Yes Deatra Canter, FNP  metFORMIN (GLUCOPHAGE) 500 MG tablet Take 2 tablets (1,000 mg total) by mouth daily with breakfast. 10/28/12  Yes Ileana Ladd, MD  metoprolol (LOPRESSOR) 50 MG tablet Take 1 tablet (50 mg total) by mouth 2 (two) times daily. 10/28/12  Yes Ileana Ladd, MD  nitroGLYCERIN (NITROSTAT) 0.4 MG SL tablet Place 0.4 mg under the tongue every 5 (five) minutes as needed.     Yes Historical Provider, MD  NON FORMULARY Take 1,500 mg by mouth every other day. Lipozene   Yes Historical Provider, MD  ondansetron (ZOFRAN) 8 MG tablet Take 1 tablet (8 mg total) by mouth every 8 (eight) hours  as needed for nausea. 11/27/12  Yes Deatra Canter, FNP  pioglitazone (ACTOS) 30 MG tablet Take 1 tablet (30 mg total) by mouth daily. 10/28/12  Yes Ileana Ladd, MD  polyethylene glycol powder (CLEARLAX) powder Take 17 g by mouth as needed.   Yes Historical Provider, MD  pravastatin (PRAVACHOL) 40 MG tablet Take 1 tablet (40 mg total) by mouth at bedtime. 10/28/12  Yes Ileana Ladd, MD  ranitidine (ZANTAC) 150 MG tablet Take 1 tablet (150 mg total) by mouth 2 (two) times daily. 11/27/12  Yes Deatra Canter, FNP  sertraline (ZOLOFT) 50 MG tablet Take 1 tablet (50 mg total) by mouth daily. 10/28/12  Yes Ileana Ladd, MD  sulfamethoxazole-trimethoprim (BACTRIM DS) 800-160 MG per tablet Take 1 tablet by mouth 2 (two) times daily. 12/16/12  Yes Ileana Ladd, MD  pantoprazole (PROTONIX) 40 MG tablet Take 1 tablet (40 mg total) by mouth daily. 07/19/11 12/16/12   Nira Retort, NP     ROS: As above in the HPI. All other systems are stable or negative.  OBJECTIVE: APPEARANCE:  Patient in no acute distress.The patient appeared well nourished and normally developed. Acyanotic. Waist: VITAL SIGNS:BP 146/77  Pulse 77  Temp(Src) 97.9 F (36.6 C) (Oral)  Wt 193 lb (87.544 kg)  BMI 36.16 kg/m2 WF obese  SKIN: warm and  Dry without overt rashes, tattoos and scars  HEAD and Neck: without JVD, Head and scalp: normal Eyes:No scleral icterus. Fundi normal, eye movements normal. Ears: Auricle normal, canal normal, Tympanic membranes normal, insufflation normal. Nose: nasal congestion Throat: normal Neck & thyroid: normal  CHEST & LUNGS: Chest wall: normal Lungs: Clear  CVS: Reveals the PMI to be normally located. Regular rhythm, First and Second Heart sounds are normal,  absence of murmurs, rubs or gallops. Peripheral vasculature: Radial pulses: normal Dorsal pedis pulses: normal Posterior pulses: normal  ABDOMEN:  Appearance: obese Benign, no organomegaly, no masses, no Abdominal Aortic enlargement. No Guarding , no rebound. No Bruits. Bowel sounds: normal  RECTAL: N/A GU: N/A  EXTREMETIES:see photo in the media. Looking better with less redness and swelling. And less tenderness Improved by 30 %. Left dorsum of foot.   NEUROLOGIC: oriented to time,place and person; nonfocal. Strength is normal Sensory is normal Reflexes are normal Cranial Nerves are normal.  ASSESSMENT: Cellulitis of foot without toes, left  Acute upper respiratory infections of unspecified site  GERD (gastroesophageal reflux disease) - Plan: dexlansoprazole (DEXILANT) 60 MG capsule  Need for Tdap vaccination - Plan: Tdap vaccine greater than or equal to 7yo IM   PLAN:  Orders Placed This Encounter  Procedures  . Tdap vaccine greater than or equal to 7yo IM    Meds ordered this encounter  Medications  . DISCONTD: dexlansoprazole (DEXILANT)  60 MG capsule    Sig: Take 60 mg by mouth daily.  Marland Kitchen dexlansoprazole (DEXILANT) 60 MG capsule    Sig: Take 1 capsule (60 mg total) by mouth daily.    Dispense:  30 capsule    Refill:  3   Medications Discontinued During This Encounter  Medication Reason  . dexlansoprazole (DEXILANT) 60 MG capsule Reorder   Results for orders placed in visit on 12/16/12  POCT CBC      Result Value Range   WBC 5.6  4.6 - 10.2 K/uL   Lymph, poc 1.6  0.6 - 3.4   POC LYMPH PERCENT 28.1  10 - 50 %L   POC Granulocyte 3.8  2 -  6.9   Granulocyte percent 68.5  37 - 80 %G   RBC 4.3  4.04 - 5.48 M/uL   Hemoglobin 12.9  12.2 - 16.2 g/dL   HCT, POC 16.1  09.6 - 47.9 %   MCV 87.4  80 - 97 fL   MCH, POC 29.9  27 - 31.2 pg   MCHC 34.2  31.8 - 35.4 g/dL   RDW, POC 04.5     Platelet Count, POC 180.0  142 - 424 K/uL   MPV 9.6  0 - 99.8 fL  GLUCOSE, POCT (MANUAL RESULT ENTRY)      Result Value Range   POC Glucose 121 (*) 70 - 99 mg/dl    Return in about 5 days (around 12/23/2012) for Recheck medical problems.  Kason Benak P. [place]esto Charon, M.D.

## 2012-12-23 ENCOUNTER — Ambulatory Visit (INDEPENDENT_AMBULATORY_CARE_PROVIDER_SITE_OTHER): Payer: Medicare Other | Admitting: Family Medicine

## 2012-12-23 ENCOUNTER — Encounter: Payer: Self-pay | Admitting: Family Medicine

## 2012-12-23 VITALS — BP 111/60 | HR 59 | Temp 97.9°F | Wt 189.0 lb

## 2012-12-23 DIAGNOSIS — R05 Cough: Secondary | ICD-10-CM

## 2012-12-23 DIAGNOSIS — J069 Acute upper respiratory infection, unspecified: Secondary | ICD-10-CM

## 2012-12-23 DIAGNOSIS — R059 Cough, unspecified: Secondary | ICD-10-CM

## 2012-12-23 DIAGNOSIS — E119 Type 2 diabetes mellitus without complications: Secondary | ICD-10-CM

## 2012-12-23 DIAGNOSIS — L02619 Cutaneous abscess of unspecified foot: Secondary | ICD-10-CM

## 2012-12-23 DIAGNOSIS — L03116 Cellulitis of left lower limb: Secondary | ICD-10-CM

## 2012-12-23 NOTE — Progress Notes (Signed)
Patient ID: Rhonda Garcia, female   DOB: [birth year], 72 y.o.   MRN: [id number] SUBJECTIVE: CC: Chief Complaint  Patient presents with  . Follow-up    reck foot left    HPI: Foot doing better. Still drains a occasional pus. Sugars have been fine. No fever. Swelling and pain much reduced.  Past Medical History  Diagnosis Date  . Diabetes mellitus   . CAD (coronary artery disease)   . MI (myocardial infarction)   . GERD (gastroesophageal reflux disease)   . Osteopenia   . Hypertension   . Hyperlipidemia   . Constipation   . Anal fissure     resolved   . Chronic pain   . Osteoarthritis   . DDD (degenerative disc disease)   . Anemia   . Airway compromise     Inability to tolerate continuous positive airway pressure  . Hiatal hernia   . Dyslipidemia   . Sleep apnea   . Osteoporosis   . Thyroid disease   . Hearing loss   . Generalized headaches   . Rectal pain   . Change in voice   . Sebaceous cyst     on back  . Back pain     with left radiculopathy  . H/O sleep apnea   . Hx of myocardial infarction   . Depression   . Hx of peptic ulcer    Past Surgical History  Procedure Laterality Date  . Left knee repair    . Appendectomy    . Complete hysterectomy    . Breast reduction surgery    . Esophagogastroduodenoscopy  06/2007    Small hiatal hernia  . Colonoscopy  06/2007    Friable anal canal and anal papillae. Pancolonic diverticula. Normal terminal ileum. Status post segmental biopsy, descending colon biopsy showed minimal cryptitis. Biopsies felt to be  nonspecific but could be seen with NSAIDs. No features of inflammatory bowel disease. Stool studies were negative.  . Colonoscopy  03/21/2011    Procedure: COLONOSCOPY;  Surgeon: Corbin Ade, MD;  Location: [facility];  Service: Endoscopy;  Laterality: N/A;  12:30  . Cholecystectomy  2008 - approximate  . Abdominal hysterectomy  2001 - approximate  . Lipoma excision  03/17/2012    Procedure: EXCISION  LIPOMA;  Surgeon: Cherylynn Ridges, MD;  Location: [facility];  Service: General;  Laterality: Right;  excision of right lower back lipoma  . Coronary angioplasty with stent placement    . Knee surgery      left knee repair   History   Social History  . Marital Status: Married    Spouse Name: N/A    Number of Children: 3  . Years of Education: N/A   Occupational History  . Retired    Social History Main Topics  . Smoking status: Former Smoker -- 0.50 packs/day for 0 years    Types: Cigarettes    Quit date: 03/13/1981  . Smokeless tobacco: Never Used     Comment: quit 30 +years ago  . Alcohol Use: No  . Drug Use: No  . Sexual Activity: Not on file   Other Topics Concern  . Not on file   Social History Narrative   Married   Family History  Problem Relation Age of Onset  . Heart attack Mother   . Hypertension Mother   . Stroke Mother     Deceased, age 7  . Stroke Sister   . Diabetes Sister   . Diabetes  Sister   . Cancer Sister     colon cancer, dx at age 76, recurrent this year  . Diabetes Brother     also has a blood disorder   . Melanoma Brother     deceased  . Coronary artery disease Other   . Kidney disease Other   . Colon cancer Neg Hx    Current Outpatient Prescriptions on File Prior to Visit  Medication Sig Dispense Refill  . Alcohol Swabs 70 % PADS by Does not apply route 2 (two) times daily.        Marland Kitchen amLODipine (NORVASC) 5 MG tablet Take 1 tablet (5 mg total) by mouth daily.  90 tablet  3  . aspirin 81 MG tablet Take 81 mg by mouth daily.      . Blood Glucose Monitoring Suppl (GLUCOCOM MONITOR) W/DEVICE KIT by Does not apply route 2 (two) times daily.        . clindamycin (CLEOCIN) 300 MG capsule Take 1 capsule (300 mg total) by mouth 3 (three) times daily.  30 capsule  0  . dexlansoprazole (DEXILANT) 60 MG capsule Take 1 capsule (60 mg total) by mouth daily.  30 capsule  3  . ezetimibe (ZETIA) 10 MG tablet Take 1 tablet (10 mg total) by  mouth daily.  30 tablet  3  . glucose blood test strip 1 each by Other route 2 (two) times daily. Use as instructed       . HYDROcodone-acetaminophen (LORCET) 5-325 MG per tablet Take 1 tablet by mouth every 6 (six) hours as needed for pain.  90 tablet  0  . isosorbide mononitrate (IMDUR) 30 MG 24 hr tablet Take 30 mg by mouth daily.      Marland Kitchen ketoconazole (NIZORAL) 2 % cream       . Lancets MISC by Does not apply route 2 (two) times daily.        Marland Kitchen lisinopril-hydrochlorothiazide (PRINZIDE,ZESTORETIC) 20-25 MG per tablet Take 1 tablet by mouth daily.  90 tablet  3  . LORazepam (ATIVAN) 0.5 MG tablet Take 1 tablet (0.5 mg total) by mouth every 12 (twelve) hours.  60 tablet  1  . meclizine (ANTIVERT) 25 MG tablet Take 1 tablet (25 mg total) by mouth 3 (three) times daily as needed.  30 tablet  0  . metFORMIN (GLUCOPHAGE) 500 MG tablet Take 2 tablets (1,000 mg total) by mouth daily with breakfast.  90 tablet  3  . metoprolol (LOPRESSOR) 50 MG tablet Take 1 tablet (50 mg total) by mouth 2 (two) times daily.  180 tablet  3  . nitroGLYCERIN (NITROSTAT) 0.4 MG SL tablet Place 0.4 mg under the tongue every 5 (five) minutes as needed.        . NON FORMULARY Take 1,500 mg by mouth every other day. Lipozene      . ondansetron (ZOFRAN) 8 MG tablet Take 1 tablet (8 mg total) by mouth every 8 (eight) hours as needed for nausea.  20 tablet  1  . pioglitazone (ACTOS) 30 MG tablet Take 1 tablet (30 mg total) by mouth daily.  90 tablet  3  . polyethylene glycol powder (CLEARLAX) powder Take 17 g by mouth as needed.      . pravastatin (PRAVACHOL) 40 MG tablet Take 1 tablet (40 mg total) by mouth at bedtime.  90 tablet  3  . ranitidine (ZANTAC) 150 MG tablet Take 1 tablet (150 mg total) by mouth 2 (two) times daily.  60 tablet  1  .  sertraline (ZOLOFT) 50 MG tablet Take 1 tablet (50 mg total) by mouth daily.  90 tablet  3  . sulfamethoxazole-trimethoprim (BACTRIM DS) 800-160 MG per tablet Take 1 tablet by mouth 2 (two)  times daily.  20 tablet  0  . pantoprazole (PROTONIX) 40 MG tablet Take 1 tablet (40 mg total) by mouth daily.  30 tablet  1   No current facility-administered medications on file prior to visit.   Allergies  Allergen Reactions  . Promethazine Hcl Anaphylaxis  . Penicillins   . Promethazine Hcl Other (See Comments)   Immunization History  Administered Date(s) Administered  . Tdap 12/18/2012   Prior to Admission medications   Medication Sig Start Date End Date Taking? Authorizing Provider  Alcohol Swabs 70 % PADS by Does not apply route 2 (two) times daily.     Yes Historical Provider, MD  amLODipine (NORVASC) 5 MG tablet Take 1 tablet (5 mg total) by mouth daily. 10/28/12  Yes Ileana Ladd, MD  aspirin 81 MG tablet Take 81 mg by mouth daily.   Yes Historical Provider, MD  Blood Glucose Monitoring Suppl (GLUCOCOM MONITOR) W/DEVICE KIT by Does not apply route 2 (two) times daily.     Yes Historical Provider, MD  clindamycin (CLEOCIN) 300 MG capsule Take 1 capsule (300 mg total) by mouth 3 (three) times daily. 12/16/12  Yes Ileana Ladd, MD  dexlansoprazole (DEXILANT) 60 MG capsule Take 1 capsule (60 mg total) by mouth daily. 12/18/12  Yes Ileana Ladd, MD  ezetimibe (ZETIA) 10 MG tablet Take 1 tablet (10 mg total) by mouth daily. 10/28/12  Yes Ileana Ladd, MD  glucose blood test strip 1 each by Other route 2 (two) times daily. Use as instructed    Yes Historical Provider, MD  HYDROcodone-acetaminophen (LORCET) 5-325 MG per tablet Take 1 tablet by mouth every 6 (six) hours as needed for pain. 10/28/12  Yes Ileana Ladd, MD  isosorbide mononitrate (IMDUR) 30 MG 24 hr tablet Take 30 mg by mouth daily.   Yes Historical Provider, MD  ketoconazole (NIZORAL) 2 % cream  10/28/12  Yes Historical Provider, MD  Lancets MISC by Does not apply route 2 (two) times daily.     Yes Historical Provider, MD  lisinopril-hydrochlorothiazide (PRINZIDE,ZESTORETIC) 20-25 MG per tablet Take 1 tablet by mouth  daily. 10/28/12  Yes Ileana Ladd, MD  LORazepam (ATIVAN) 0.5 MG tablet Take 1 tablet (0.5 mg total) by mouth every 12 (twelve) hours. 10/28/12  Yes Ileana Ladd, MD  meclizine (ANTIVERT) 25 MG tablet Take 1 tablet (25 mg total) by mouth 3 (three) times daily as needed. 11/27/12  Yes Deatra Canter, FNP  metFORMIN (GLUCOPHAGE) 500 MG tablet Take 2 tablets (1,000 mg total) by mouth daily with breakfast. 10/28/12  Yes Ileana Ladd, MD  metoprolol (LOPRESSOR) 50 MG tablet Take 1 tablet (50 mg total) by mouth 2 (two) times daily. 10/28/12  Yes Ileana Ladd, MD  nitroGLYCERIN (NITROSTAT) 0.4 MG SL tablet Place 0.4 mg under the tongue every 5 (five) minutes as needed.     Yes Historical Provider, MD  NON FORMULARY Take 1,500 mg by mouth every other day. Lipozene   Yes Historical Provider, MD  ondansetron (ZOFRAN) 8 MG tablet Take 1 tablet (8 mg total) by mouth every 8 (eight) hours as needed for nausea. 11/27/12  Yes Deatra Canter, FNP  pioglitazone (ACTOS) 30 MG tablet Take 1 tablet (30 mg total) by mouth daily. 10/28/12  Yes Ileana Ladd, MD  polyethylene glycol powder (CLEARLAX) powder Take 17 g by mouth as needed.   Yes Historical Provider, MD  pravastatin (PRAVACHOL) 40 MG tablet Take 1 tablet (40 mg total) by mouth at bedtime. 10/28/12  Yes Ileana Ladd, MD  ranitidine (ZANTAC) 150 MG tablet Take 1 tablet (150 mg total) by mouth 2 (two) times daily. 11/27/12  Yes Deatra Canter, FNP  sertraline (ZOLOFT) 50 MG tablet Take 1 tablet (50 mg total) by mouth daily. 10/28/12  Yes Ileana Ladd, MD  sulfamethoxazole-trimethoprim (BACTRIM DS) 800-160 MG per tablet Take 1 tablet by mouth 2 (two) times daily. 12/16/12  Yes Ileana Ladd, MD  pantoprazole (PROTONIX) 40 MG tablet Take 1 tablet (40 mg total) by mouth daily. 07/19/11 12/16/12  Nira Retort, NP    ROS: As above in the HPI. All other systems are stable or negative.  OBJECTIVE: APPEARANCE:  Patient in no acute distress.The patient appeared  well nourished and normally developed. Acyanotic. Waist: VITAL SIGNS:BP 111/60  Pulse 59  Temp(Src) 97.9 F (36.6 C) (Oral)  Wt 189 lb (85.73 kg)  BMI 35.41 kg/m2  WF obese  SKIN: warm and  Dry without overt rashes, tattoos and scars See extremity section  HEAD and Neck: without JVD, Head and scalp: normal Eyes:No scleral icterus. Fundi normal, eye movements normal. Ears: Auricle normal, canal normal, Tympanic membranes normal, insufflation normal. Nose: rhinorrhea Throat: hoarse and hyperemic Neck & thyroid: normal  CHEST & LUNGS: Chest wall: normal Lungs: Coarse breath sounds.  CVS: Reveals the PMI to be normally located. Regular rhythm, First and Second Heart sounds are normal,  absence of murmurs, rubs or gallops. Peripheral vasculature: Radial pulses: normal Dorsal pedis pulses: normal Posterior pulses: normal  ABDOMEN:  Appearance: normal Benign, no organomegaly, no masses, no Abdominal Aortic enlargement. No Guarding , no rebound. No Bruits. Bowel sounds: normal  RECTAL: N/A GU: N/A  EXTREMETIES: nonedematous. Small 1 cm localized swelling at the puncture site. No redness, one drop of pud expressed. Tenderness to expressing the pus. Much better   MUSCULOSKELETAL:  Spine: normal Joints: intact  NEUROLOGIC: oriented to time,place and person; nonfocal. Strength is normal Sensory is normal Reflexes are normal Cranial Nerves are normal.  ASSESSMENT: Cellulitis of foot without toes, left  Type II or unspecified type diabetes mellitus without mention of complication, not stated as uncontrolled  Acute upper respiratory infections of unspecified site  Cough   much better in regards to the cellulitis. New onset of the URI which is preva;lent in the community recently. Has antibiotics for at least 3 days.  PLAN: Fluids rest . Continue with wound care and antibiotics. Out of work the rest of the week due to the respiratory illness. 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   Return in about 1 week (around 12/30/2012) for Recheck medical problems.  Veronia Laprise P. Modesto Charon, M.D.

## 2012-12-29 ENCOUNTER — Encounter: Payer: Self-pay | Admitting: Family Medicine

## 2012-12-29 ENCOUNTER — Ambulatory Visit (INDEPENDENT_AMBULATORY_CARE_PROVIDER_SITE_OTHER): Payer: Medicare Other | Admitting: Family Medicine

## 2012-12-29 VITALS — BP 147/63 | HR 62 | Temp 97.1°F | Wt 190.6 lb

## 2012-12-29 DIAGNOSIS — L02619 Cutaneous abscess of unspecified foot: Secondary | ICD-10-CM

## 2012-12-29 DIAGNOSIS — L03116 Cellulitis of left lower limb: Secondary | ICD-10-CM

## 2012-12-29 DIAGNOSIS — E119 Type 2 diabetes mellitus without complications: Secondary | ICD-10-CM

## 2012-12-29 DIAGNOSIS — L0390 Cellulitis, unspecified: Secondary | ICD-10-CM

## 2012-12-29 DIAGNOSIS — J069 Acute upper respiratory infection, unspecified: Secondary | ICD-10-CM

## 2012-12-29 DIAGNOSIS — K219 Gastro-esophageal reflux disease without esophagitis: Secondary | ICD-10-CM

## 2012-12-29 DIAGNOSIS — J209 Acute bronchitis, unspecified: Secondary | ICD-10-CM

## 2012-12-29 DIAGNOSIS — I10 Essential (primary) hypertension: Secondary | ICD-10-CM

## 2012-12-29 DIAGNOSIS — E785 Hyperlipidemia, unspecified: Secondary | ICD-10-CM

## 2012-12-29 DIAGNOSIS — L0291 Cutaneous abscess, unspecified: Secondary | ICD-10-CM

## 2012-12-29 MED ORDER — PANTOPRAZOLE SODIUM 40 MG PO TBEC: 40.0000 mg | DELAYED_RELEASE_TABLET | Freq: Every day | ORAL | Status: DC

## 2012-12-29 MED ORDER — CLINDAMYCIN HCL 300 MG PO CAPS: 300.0000 mg | ORAL_CAPSULE | Freq: Three times a day (TID) | ORAL | Status: DC

## 2012-12-29 NOTE — Progress Notes (Signed)
Patient ID: Rhonda Garcia, female   DOB: [birth year], 72 y.o.   MRN: [id number] SUBJECTIVE: CC: Chief Complaint  Patient presents with  . Follow-up    reck foot c/o cough green wants something cheaper for reflux inusrance did not pay     HPI: Her for follow up of her left foot cellulitis secondary to foreign body puncture wound from a piece of stick. There is a bump remaining but the cellulitis has resolved.  Her URI has become a bronchitis and coughing up phlegm. Her husband is here sick as well. She thinks she would benefit from th eclindamycin Coughing up thick phlegm.  GERD, needs a different Rx that her insurance will cover. The omeprazole doesn't work well enough for her.  Patient is here for follow up of Diabetes Mellitus: Symptoms evaluated: Denies Nocturia ,Denies Urinary Frequency , denies Blurred vision ,deniesDizziness,denies.Dysuria,denies paresthesias, denies extremity pain or ulcers.Marland Kitchendenies chest pain. has had an annual eye exam. do check the feet. Does check CBGs. Average CBG:135 since being sick. Denies episodes of hypoglycemia. Does have an emergency hypoglycemic plan. admits to Compliance with medications. Denies Problems with medications.   Past Medical History  Diagnosis Date  . Diabetes mellitus   . CAD (coronary artery disease)   . MI (myocardial infarction)   . GERD (gastroesophageal reflux disease)   . Osteopenia   . Hypertension   . Hyperlipidemia   . Constipation   . Anal fissure     resolved   . Chronic pain   . Osteoarthritis   . DDD (degenerative disc disease)   . Anemia   . Airway compromise     Inability to tolerate continuous positive airway pressure  . Hiatal hernia   . Dyslipidemia   . Sleep apnea   . Osteoporosis   . Thyroid disease   . Hearing loss   . Generalized headaches   . Rectal pain   . Change in voice   . Sebaceous cyst     on back  . Back pain     with left radiculopathy  . H/O sleep apnea   . Hx of myocardial  infarction   . Depression   . Hx of peptic ulcer    Past Surgical History  Procedure Laterality Date  . Left knee repair    . Appendectomy    . Complete hysterectomy    . Breast reduction surgery    . Esophagogastroduodenoscopy  06/2007    Small hiatal hernia  . Colonoscopy  06/2007    Friable anal canal and anal papillae. Pancolonic diverticula. Normal terminal ileum. Status post segmental biopsy, descending colon biopsy showed minimal cryptitis. Biopsies felt to be  nonspecific but could be seen with NSAIDs. No features of inflammatory bowel disease. Stool studies were negative.  . Colonoscopy  03/21/2011    Procedure: COLONOSCOPY;  Surgeon: Corbin Ade, MD;  Location: [facility];  Service: Endoscopy;  Laterality: N/A;  12:30  . Cholecystectomy  2008 - approximate  . Abdominal hysterectomy  2001 - approximate  . Lipoma excision  03/17/2012    Procedure: EXCISION LIPOMA;  Surgeon: Cherylynn Ridges, MD;  Location: [facility];  Service: General;  Laterality: Right;  excision of right lower back lipoma  . Coronary angioplasty with stent placement    . Knee surgery      left knee repair   History   Social History  . Marital Status: Married    Spouse Name: N/A    Number of  Children: 3  . Years of Education: N/A   Occupational History  . Retired    Social History Main Topics  . Smoking status: Former Smoker -- 0.50 packs/day for 0 years    Types: Cigarettes    Quit date: 03/13/1981  . Smokeless tobacco: Never Used     Comment: quit 30 +years ago  . Alcohol Use: No  . Drug Use: No  . Sexual Activity: Not on file   Other Topics Concern  . Not on file   Social History Narrative   Married   Family History  Problem Relation Age of Onset  . Heart attack Mother   . Hypertension Mother   . Stroke Mother     Deceased, age 69  . Stroke Sister   . Diabetes Sister   . Diabetes Sister   . Cancer Sister     colon cancer, dx at age 69, recurrent this year  .  Diabetes Brother     also has a blood disorder   . Melanoma Brother     deceased  . Coronary artery disease Other   . Kidney disease Other   . Colon cancer Neg Hx    Current Outpatient Prescriptions on File Prior to Visit  Medication Sig Dispense Refill  . Alcohol Swabs 70 % PADS by Does not apply route 2 (two) times daily.        Marland Kitchen amLODipine (NORVASC) 5 MG tablet Take 1 tablet (5 mg total) by mouth daily.  90 tablet  3  . aspirin 81 MG tablet Take 81 mg by mouth daily.      . Blood Glucose Monitoring Suppl (GLUCOCOM MONITOR) W/DEVICE KIT by Does not apply route 2 (two) times daily.        . clindamycin (CLEOCIN) 300 MG capsule Take 1 capsule (300 mg total) by mouth 3 (three) times daily.  30 capsule  0  . dexlansoprazole (DEXILANT) 60 MG capsule Take 1 capsule (60 mg total) by mouth daily.  30 capsule  3  . ezetimibe (ZETIA) 10 MG tablet Take 1 tablet (10 mg total) by mouth daily.  30 tablet  3  . glucose blood test strip 1 each by Other route 2 (two) times daily. Use as instructed       . HYDROcodone-acetaminophen (LORCET) 5-325 MG per tablet Take 1 tablet by mouth every 6 (six) hours as needed for pain.  90 tablet  0  . isosorbide mononitrate (IMDUR) 30 MG 24 hr tablet Take 30 mg by mouth daily.      Marland Kitchen ketoconazole (NIZORAL) 2 % cream       . Lancets MISC by Does not apply route 2 (two) times daily.        Marland Kitchen lisinopril-hydrochlorothiazide (PRINZIDE,ZESTORETIC) 20-25 MG per tablet Take 1 tablet by mouth daily.  90 tablet  3  . LORazepam (ATIVAN) 0.5 MG tablet Take 1 tablet (0.5 mg total) by mouth every 12 (twelve) hours.  60 tablet  1  . meclizine (ANTIVERT) 25 MG tablet Take 1 tablet (25 mg total) by mouth 3 (three) times daily as needed.  30 tablet  0  . metFORMIN (GLUCOPHAGE) 500 MG tablet Take 2 tablets (1,000 mg total) by mouth daily with breakfast.  90 tablet  3  . metoprolol (LOPRESSOR) 50 MG tablet Take 1 tablet (50 mg total) by mouth 2 (two) times daily.  180 tablet  3  .  nitroGLYCERIN (NITROSTAT) 0.4 MG SL tablet Place 0.4 mg under the tongue every  5 (five) minutes as needed.        . NON FORMULARY Take 1,500 mg by mouth every other day. Lipozene      . ondansetron (ZOFRAN) 8 MG tablet Take 1 tablet (8 mg total) by mouth every 8 (eight) hours as needed for nausea.  20 tablet  1  . pioglitazone (ACTOS) 30 MG tablet Take 1 tablet (30 mg total) by mouth daily.  90 tablet  3  . polyethylene glycol powder (CLEARLAX) powder Take 17 g by mouth as needed.      . pravastatin (PRAVACHOL) 40 MG tablet Take 1 tablet (40 mg total) by mouth at bedtime.  90 tablet  3  . ranitidine (ZANTAC) 150 MG tablet Take 1 tablet (150 mg total) by mouth 2 (two) times daily.  60 tablet  1  . sertraline (ZOLOFT) 50 MG tablet Take 1 tablet (50 mg total) by mouth daily.  90 tablet  3  . sulfamethoxazole-trimethoprim (BACTRIM DS) 800-160 MG per tablet Take 1 tablet by mouth 2 (two) times daily.  20 tablet  0  . pantoprazole (PROTONIX) 40 MG tablet Take 1 tablet (40 mg total) by mouth daily.  30 tablet  1   No current facility-administered medications on file prior to visit.   Allergies  Allergen Reactions  . Promethazine Hcl Anaphylaxis  . Penicillins   . Promethazine Hcl Other (See Comments)   Immunization History  Administered Date(s) Administered  . Tdap 12/18/2012   Prior to Admission medications   Medication Sig Start Date End Date Taking? Authorizing Provider  Alcohol Swabs 70 % PADS by Does not apply route 2 (two) times daily.     Yes Historical Provider, MD  amLODipine (NORVASC) 5 MG tablet Take 1 tablet (5 mg total) by mouth daily. 10/28/12  Yes Ileana Ladd, MD  aspirin 81 MG tablet Take 81 mg by mouth daily.   Yes Historical Provider, MD  Blood Glucose Monitoring Suppl (GLUCOCOM MONITOR) W/DEVICE KIT by Does not apply route 2 (two) times daily.     Yes Historical Provider, MD  clindamycin (CLEOCIN) 300 MG capsule Take 1 capsule (300 mg total) by mouth 3 (three) times daily.  12/16/12  Yes Ileana Ladd, MD  dexlansoprazole (DEXILANT) 60 MG capsule Take 1 capsule (60 mg total) by mouth daily. 12/18/12  Yes Ileana Ladd, MD  ezetimibe (ZETIA) 10 MG tablet Take 1 tablet (10 mg total) by mouth daily. 10/28/12  Yes Ileana Ladd, MD  glucose blood test strip 1 each by Other route 2 (two) times daily. Use as instructed    Yes Historical Provider, MD  HYDROcodone-acetaminophen (LORCET) 5-325 MG per tablet Take 1 tablet by mouth every 6 (six) hours as needed for pain. 10/28/12  Yes Ileana Ladd, MD  isosorbide mononitrate (IMDUR) 30 MG 24 hr tablet Take 30 mg by mouth daily.   Yes Historical Provider, MD  ketoconazole (NIZORAL) 2 % cream  10/28/12  Yes Historical Provider, MD  Lancets MISC by Does not apply route 2 (two) times daily.     Yes Historical Provider, MD  lisinopril-hydrochlorothiazide (PRINZIDE,ZESTORETIC) 20-25 MG per tablet Take 1 tablet by mouth daily. 10/28/12  Yes Ileana Ladd, MD  LORazepam (ATIVAN) 0.5 MG tablet Take 1 tablet (0.5 mg total) by mouth every 12 (twelve) hours. 10/28/12  Yes Ileana Ladd, MD  meclizine (ANTIVERT) 25 MG tablet Take 1 tablet (25 mg total) by mouth 3 (three) times daily as needed. 11/27/12  Yes Chrissie Noa  Sabino Gasser, FNP  metFORMIN (GLUCOPHAGE) 500 MG tablet Take 2 tablets (1,000 mg total) by mouth daily with breakfast. 10/28/12  Yes Ileana Ladd, MD  metoprolol (LOPRESSOR) 50 MG tablet Take 1 tablet (50 mg total) by mouth 2 (two) times daily. 10/28/12  Yes Ileana Ladd, MD  nitroGLYCERIN (NITROSTAT) 0.4 MG SL tablet Place 0.4 mg under the tongue every 5 (five) minutes as needed.     Yes Historical Provider, MD  NON FORMULARY Take 1,500 mg by mouth every other day. Lipozene   Yes Historical Provider, MD  ondansetron (ZOFRAN) 8 MG tablet Take 1 tablet (8 mg total) by mouth every 8 (eight) hours as needed for nausea. 11/27/12  Yes Deatra Canter, FNP  pioglitazone (ACTOS) 30 MG tablet Take 1 tablet (30 mg total) by mouth daily. 10/28/12   Yes Ileana Ladd, MD  polyethylene glycol powder (CLEARLAX) powder Take 17 g by mouth as needed.   Yes Historical Provider, MD  pravastatin (PRAVACHOL) 40 MG tablet Take 1 tablet (40 mg total) by mouth at bedtime. 10/28/12  Yes Ileana Ladd, MD  ranitidine (ZANTAC) 150 MG tablet Take 1 tablet (150 mg total) by mouth 2 (two) times daily. 11/27/12  Yes Deatra Canter, FNP  sertraline (ZOLOFT) 50 MG tablet Take 1 tablet (50 mg total) by mouth daily. 10/28/12  Yes Ileana Ladd, MD  sulfamethoxazole-trimethoprim (BACTRIM DS) 800-160 MG per tablet Take 1 tablet by mouth 2 (two) times daily. 12/16/12  Yes Ileana Ladd, MD  pantoprazole (PROTONIX) 40 MG tablet Take 1 tablet (40 mg total) by mouth daily. 07/19/11 12/16/12  Nira Retort, NP     ROS: As above in the HPI. All other systems are stable or negative.  OBJECTIVE: APPEARANCE:  Patient in no acute distress.The patient appeared well nourished and normally developed. Acyanotic. Waist: VITAL SIGNS:BP 147/63  Pulse 62  Temp(Src) 97.1 F (36.2 C) (Oral)  Wt 190 lb 9.6 oz (86.456 kg)  BMI 35.71 kg/m2 WF obese  SKIN: warm and  Dry without overt rashes, tattoos and scars  HEAD and Neck: without JVD, Head and scalp: normal Eyes:No scleral icterus. Fundi normal, eye movements normal. Ears: Auricle normal, canal normal, Tympanic membranes normal, insufflation normal. Nose: normal Throat: normal Neck & thyroid: normal  CHEST & LUNGS: Rattling cough Chest wall: normal Lungs: Coarse breath sounds. No wheezes but scattered Rhonchi  CVS: Reveals the PMI to be normally located. Regular rhythm, First and Second Heart sounds are normal,  absence of murmurs, rubs or gallops. Peripheral vasculature: Radial pulses: normal Dorsal pedis pulses: normal Posterior pulses: normal  ABDOMEN:  Appearance: obese Benign, no organomegaly, no masses, no Abdominal Aortic enlargement. No Guarding , no rebound. No Bruits. Bowel sounds:  normal  RECTAL: N/A GU: N/A  EXTREMETIES: nonedematous.small bump on the top of the foot where she had the puncture wound. No drainage, no signs of infection.  MUSCULOSKELETAL:  Spine: normal Joints: intact  NEUROLOGIC: oriented to time,place and person; nonfocal.  ASSESSMENT: Acute upper respiratory infections of unspecified site  Acute bronchitis - Plan: clindamycin (CLEOCIN) 300 MG capsule  HLD (hyperlipidemia)  HYPERTENSION, UNSPECIFIED  Type II or unspecified type diabetes mellitus without mention of complication, not stated as uncontrolled  Cellulitis of foot without toes, left  Cellulitis - Plan: clindamycin (CLEOCIN) 300 MG capsule  GERD (gastroesophageal reflux disease) - Plan: pantoprazole (PROTONIX) 40 MG tablet the cellulitis has resolved  PLAN: Meds ordered this encounter  Medications  . clindamycin (  CLEOCIN) 300 MG capsule    Sig: Take 1 capsule (300 mg total) by mouth 3 (three) times daily.    Dispense:  30 capsule    Refill:  0  . pantoprazole (PROTONIX) 40 MG tablet    Sig: Take 1 tablet (40 mg total) by mouth daily.    Dispense:  30 tablet    Refill:  3   Keep the routine follow up scheduled next month Monitor her DM.  Jaylenn Baiza P. Modesto Charon, M.D.

## 2013-01-19 ENCOUNTER — Ambulatory Visit: Payer: Medicare Other

## 2013-01-30 ENCOUNTER — Ambulatory Visit: Payer: Medicare Other | Admitting: Family Medicine

## 2013-02-03 ENCOUNTER — Encounter: Payer: Self-pay | Admitting: Family Medicine

## 2013-02-03 ENCOUNTER — Ambulatory Visit (INDEPENDENT_AMBULATORY_CARE_PROVIDER_SITE_OTHER): Payer: Medicare Other | Admitting: Family Medicine

## 2013-02-03 ENCOUNTER — Ambulatory Visit (INDEPENDENT_AMBULATORY_CARE_PROVIDER_SITE_OTHER): Payer: Medicare Other

## 2013-02-03 VITALS — BP 144/76 | HR 57 | Temp 97.0°F | Ht 62.0 in | Wt 194.4 lb

## 2013-02-03 DIAGNOSIS — E538 Deficiency of other specified B group vitamins: Secondary | ICD-10-CM

## 2013-02-03 DIAGNOSIS — I2510 Atherosclerotic heart disease of native coronary artery without angina pectoris: Secondary | ICD-10-CM

## 2013-02-03 DIAGNOSIS — IMO0002 Reserved for concepts with insufficient information to code with codable children: Secondary | ICD-10-CM

## 2013-02-03 DIAGNOSIS — M1990 Unspecified osteoarthritis, unspecified site: Secondary | ICD-10-CM

## 2013-02-03 DIAGNOSIS — M5416 Radiculopathy, lumbar region: Secondary | ICD-10-CM

## 2013-02-03 DIAGNOSIS — E119 Type 2 diabetes mellitus without complications: Secondary | ICD-10-CM

## 2013-02-03 DIAGNOSIS — Z1212 Encounter for screening for malignant neoplasm of rectum: Secondary | ICD-10-CM

## 2013-02-03 DIAGNOSIS — I10 Essential (primary) hypertension: Secondary | ICD-10-CM

## 2013-02-03 DIAGNOSIS — F329 Major depressive disorder, single episode, unspecified: Secondary | ICD-10-CM

## 2013-02-03 DIAGNOSIS — F32A Depression, unspecified: Secondary | ICD-10-CM

## 2013-02-03 DIAGNOSIS — F3289 Other specified depressive episodes: Secondary | ICD-10-CM

## 2013-02-03 DIAGNOSIS — E785 Hyperlipidemia, unspecified: Secondary | ICD-10-CM

## 2013-02-03 LAB — POCT GLYCOSYLATED HEMOGLOBIN (HGB A1C): Hemoglobin A1C: 6.3

## 2013-02-03 MED ORDER — HYDROCODONE-ACETAMINOPHEN 5-325 MG PO TABS: 1.0000 | ORAL_TABLET | Freq: Four times a day (QID) | ORAL | Status: DC | PRN

## 2013-02-03 MED ORDER — LORAZEPAM 0.5 MG PO TABS: 0.5000 mg | ORAL_TABLET | Freq: Two times a day (BID) | ORAL | Status: DC

## 2013-02-03 NOTE — Progress Notes (Signed)
Patient ID: Rhonda Garcia, female   DOB: 05-08-1940, 72 y.o.   MRN: [id number] SUBJECTIVE: CC: Chief Complaint  Patient presents with  . Follow-up    3 month wants xanax and refill hydrocodone and zantac    HPI: Patient is here for follow up of Diabetes Mellitus: Symptoms evaluated: Denies Nocturia ,Denies Urinary Frequency , denies Blurred vision ,deniesDizziness,denies.Dysuria,denies paresthesias, denies extremity pain or ulcers.Marland Kitchendenies chest pain. has had an annual eye exam. do check the feet. Does check CBGs. Average CBG: Denies episodes of hypoglycemia.  Has had mid lumbar pain and it radiates around the right side to the right inguinal area. Aggravated by bending and twisting. Has been told in the back that her Xray of her back was terrible.  Does have an emergency hypoglycemic plan. admits toCompliance with medications. Denies Problems with medications.   Past Medical History  Diagnosis Date  . Diabetes mellitus   . CAD (coronary artery disease)   . MI (myocardial infarction)   . GERD (gastroesophageal reflux disease)   . Osteopenia   . Hypertension   . Hyperlipidemia   . Constipation   . Anal fissure     resolved   . Chronic pain   . Osteoarthritis   . DDD (degenerative disc disease)   . Anemia   . Airway compromise     Inability to tolerate continuous positive airway pressure  . Hiatal hernia   . Dyslipidemia   . Sleep apnea   . Osteoporosis   . Thyroid disease   . Hearing loss   . Generalized headaches   . Rectal pain   . Change in voice   . Sebaceous cyst     on back  . Back pain     with left radiculopathy  . H/O sleep apnea   . Hx of myocardial infarction   . Depression   . Hx of peptic ulcer    Past Surgical History  Procedure Laterality Date  . Left knee repair    . Appendectomy    . Complete hysterectomy    . Breast reduction surgery    . Esophagogastroduodenoscopy  06/2007    Small hiatal hernia  . Colonoscopy  06/2007    Friable  anal canal and anal papillae. Pancolonic diverticula. Normal terminal ileum. Status post segmental biopsy, descending colon biopsy showed minimal cryptitis. Biopsies felt to be  nonspecific but could be seen with NSAIDs. No features of inflammatory bowel disease. Stool studies were negative.  . Colonoscopy  03/21/2011    Procedure: COLONOSCOPY;  Surgeon: Corbin Ade, MD;  Location: [facility];  Service: Endoscopy;  Laterality: N/A;  12:30  . Cholecystectomy  2008 - approximate  . Abdominal hysterectomy  2001 - approximate  . Lipoma excision  03/17/2012    Procedure: EXCISION LIPOMA;  Surgeon: Cherylynn Ridges, MD;  Location: [facility];  Service: General;  Laterality: Right;  excision of right lower back lipoma  . Coronary angioplasty with stent placement    . Knee surgery      left knee repair   History   Social History  . Marital Status: Married    Spouse Name: N/A    Number of Children: 3  . Years of Education: N/A   Occupational History  . Retired    Social History Main Topics  . Smoking status: Former Smoker -- 0.50 packs/day for 0 years    Types: Cigarettes    Quit date: 03/13/1981  . Smokeless tobacco: Never Used  Comment: quit 30 +years ago  . Alcohol Use: No  . Drug Use: No  . Sexual Activity: Not on file   Other Topics Concern  . Not on file   Social History Narrative   Married   Family History  Problem Relation Age of Onset  . Heart attack Mother   . Hypertension Mother   . Stroke Mother     Deceased, age 63  . Stroke Sister   . Diabetes Sister   . Diabetes Sister   . Cancer Sister     colon cancer, dx at age 39, recurrent this year  . Diabetes Brother     also has a blood disorder   . Melanoma Brother     deceased  . Coronary artery disease Other   . Kidney disease Other   . Colon cancer Neg Hx    Current Outpatient Prescriptions on File Prior to Visit  Medication Sig Dispense Refill  . Alcohol Swabs 70 % PADS by Does not  apply route 2 (two) times daily.        Marland Kitchen amLODipine (NORVASC) 5 MG tablet Take 1 tablet (5 mg total) by mouth daily.  90 tablet  3  . aspirin 81 MG tablet Take 81 mg by mouth daily.      . Blood Glucose Monitoring Suppl (GLUCOCOM MONITOR) W/DEVICE KIT by Does not apply route 2 (two) times daily.        Marland Kitchen ezetimibe (ZETIA) 10 MG tablet Take 1 tablet (10 mg total) by mouth daily.  30 tablet  3  . glucose blood test strip 1 each by Other route 2 (two) times daily. Use as instructed       . isosorbide mononitrate (IMDUR) 30 MG 24 hr tablet Take 30 mg by mouth daily.      Marland Kitchen ketoconazole (NIZORAL) 2 % cream       . Lancets MISC by Does not apply route 2 (two) times daily.        Marland Kitchen lisinopril-hydrochlorothiazide (PRINZIDE,ZESTORETIC) 20-25 MG per tablet Take 1 tablet by mouth daily.  90 tablet  3  . meclizine (ANTIVERT) 25 MG tablet Take 1 tablet (25 mg total) by mouth 3 (three) times daily as needed.  30 tablet  0  . metFORMIN (GLUCOPHAGE) 500 MG tablet Take 2 tablets (1,000 mg total) by mouth daily with breakfast.  90 tablet  3  . metoprolol (LOPRESSOR) 50 MG tablet Take 1 tablet (50 mg total) by mouth 2 (two) times daily.  180 tablet  3  . nitroGLYCERIN (NITROSTAT) 0.4 MG SL tablet Place 0.4 mg under the tongue every 5 (five) minutes as needed.        . NON FORMULARY Take 1,500 mg by mouth every other day. Lipozene      . ondansetron (ZOFRAN) 8 MG tablet Take 1 tablet (8 mg total) by mouth every 8 (eight) hours as needed for nausea.  20 tablet  1  . pantoprazole (PROTONIX) 40 MG tablet Take 1 tablet (40 mg total) by mouth daily.  30 tablet  3  . pioglitazone (ACTOS) 30 MG tablet Take 1 tablet (30 mg total) by mouth daily.  90 tablet  3  . polyethylene glycol powder (CLEARLAX) powder Take 17 g by mouth as needed.      . pravastatin (PRAVACHOL) 40 MG tablet Take 1 tablet (40 mg total) by mouth at bedtime.  90 tablet  3  . ranitidine (ZANTAC) 150 MG tablet Take 1 tablet (150 mg total)  by mouth 2 (two)  times daily.  60 tablet  1  . sertraline (ZOLOFT) 50 MG tablet Take 1 tablet (50 mg total) by mouth daily.  90 tablet  3   No current facility-administered medications on file prior to visit.   Allergies  Allergen Reactions  . Promethazine Hcl Anaphylaxis  . Penicillins   . Promethazine Hcl Other (See Comments)   Immunization History  Administered Date(s) Administered  . Influenza,inj,Quad PF,36+ Mos 01/19/2013  . Tdap 12/18/2012   Prior to Admission medications   Medication Sig Start Date End Date Taking? Authorizing Provider  Alcohol Swabs 70 % PADS by Does not apply route 2 (two) times daily.     Yes Historical Provider, MD  amLODipine (NORVASC) 5 MG tablet Take 1 tablet (5 mg total) by mouth daily. 10/28/12  Yes Ileana Ladd, MD  aspirin 81 MG tablet Take 81 mg by mouth daily.   Yes Historical Provider, MD  Blood Glucose Monitoring Suppl (GLUCOCOM MONITOR) W/DEVICE KIT by Does not apply route 2 (two) times daily.     Yes Historical Provider, MD  ezetimibe (ZETIA) 10 MG tablet Take 1 tablet (10 mg total) by mouth daily. 10/28/12  Yes Ileana Ladd, MD  glucose blood test strip 1 each by Other route 2 (two) times daily. Use as instructed    Yes Historical Provider, MD  HYDROcodone-acetaminophen (LORCET) 5-325 MG per tablet Take 1 tablet by mouth every 6 (six) hours as needed for pain. 10/28/12  Yes Ileana Ladd, MD  isosorbide mononitrate (IMDUR) 30 MG 24 hr tablet Take 30 mg by mouth daily.   Yes Historical Provider, MD  ketoconazole (NIZORAL) 2 % cream  10/28/12  Yes Historical Provider, MD  Lancets MISC by Does not apply route 2 (two) times daily.     Yes Historical Provider, MD  lisinopril-hydrochlorothiazide (PRINZIDE,ZESTORETIC) 20-25 MG per tablet Take 1 tablet by mouth daily. 10/28/12  Yes Ileana Ladd, MD  LORazepam (ATIVAN) 0.5 MG tablet Take 1 tablet (0.5 mg total) by mouth every 12 (twelve) hours. 10/28/12  Yes Ileana Ladd, MD  meclizine (ANTIVERT) 25 MG tablet Take 1  tablet (25 mg total) by mouth 3 (three) times daily as needed. 11/27/12  Yes Deatra Canter, FNP  metFORMIN (GLUCOPHAGE) 500 MG tablet Take 2 tablets (1,000 mg total) by mouth daily with breakfast. 10/28/12  Yes Ileana Ladd, MD  metoprolol (LOPRESSOR) 50 MG tablet Take 1 tablet (50 mg total) by mouth 2 (two) times daily. 10/28/12  Yes Ileana Ladd, MD  nitroGLYCERIN (NITROSTAT) 0.4 MG SL tablet Place 0.4 mg under the tongue every 5 (five) minutes as needed.     Yes Historical Provider, MD  NON FORMULARY Take 1,500 mg by mouth every other day. Lipozene   Yes Historical Provider, MD  ondansetron (ZOFRAN) 8 MG tablet Take 1 tablet (8 mg total) by mouth every 8 (eight) hours as needed for nausea. 11/27/12  Yes Deatra Canter, FNP  pantoprazole (PROTONIX) 40 MG tablet Take 1 tablet (40 mg total) by mouth daily. 12/29/12 05/29/14 Yes Ileana Ladd, MD  pioglitazone (ACTOS) 30 MG tablet Take 1 tablet (30 mg total) by mouth daily. 10/28/12  Yes Ileana Ladd, MD  polyethylene glycol powder (CLEARLAX) powder Take 17 g by mouth as needed.   Yes Historical Provider, MD  pravastatin (PRAVACHOL) 40 MG tablet Take 1 tablet (40 mg total) by mouth at bedtime. 10/28/12  Yes Ileana Ladd, MD  ranitidine (  ZANTAC) 150 MG tablet Take 1 tablet (150 mg total) by mouth 2 (two) times daily. 11/27/12  Yes Deatra Canter, FNP  sertraline (ZOLOFT) 50 MG tablet Take 1 tablet (50 mg total) by mouth daily. 10/28/12  Yes Ileana Ladd, MD     ROS: As above in the HPI. All other systems are stable or negative.  OBJECTIVE: APPEARANCE:  Patient in no acute distress.The patient appeared well nourished and normally developed. Acyanotic. Waist: VITAL SIGNS:BP 144/76  Pulse 57  Temp(Src) 97 F (36.1 C) (Oral)  Ht 5\' 2"  (1.575 m)  Wt 194 lb 6.4 oz (88.179 kg)  BMI 35.55 kg/m2 WF obese  SKIN: warm and  Dry without overt rashes, tattoos and scars  HEAD and Neck: without JVD, Head and scalp: normal Eyes:No scleral  icterus. Fundi normal, eye movements normal. Ears: Auricle normal, canal normal, Tympanic membranes normal, insufflation normal. Nose: normal Throat: normal Neck & thyroid: normal  CHEST & LUNGS: Chest wall: normal Lungs: Clear  CVS: Reveals the PMI to be normally located. Regular rhythm, First and Second Heart sounds are normal,  absence of murmurs, rubs or gallops. Peripheral vasculature: Radial pulses: normal Dorsal pedis pulses: normal Posterior pulses: normal  ABDOMEN:  Appearance: normal Benign, no organomegaly, no masses, no Abdominal Aortic enlargement. No Guarding , no rebound. No Bruits. Bowel sounds: normal  RECTAL: N/A GU: N/A  EXTREMETIES: nonedematous.  MUSCULOSKELETAL:  Spine: reduced ROM Joints: intact  NEUROLOGIC: oriented to time,place and person; nonfocal. Strength is normal Sensory is normal Reflexes are normal Cranial Nerves are normal.  ASSESSMENT: CAD, NATIVE VESSEL  Depression  HYPERLIPIDEMIA-MIXED - Plan: NMR, lipoprofile  HYPERTENSION, UNSPECIFIED - Plan: CMP14+EGFR  OSTEOARTHRITIS  Screening for malignant neoplasm of the rectum  Type II or unspecified type diabetes mellitus without mention of complication, not stated as uncontrolled - Plan: POCT glycosylated hemoglobin (Hb A1C), CMP14+EGFR  Lumbar radiculopathy - Plan: DG Lumbar Spine Complete  B12 deficiency  PLAN:  Orders Placed This Encounter  Procedures  . DG Lumbar Spine Complete    Standing Status: Future     Number of Occurrences: 1     Standing Expiration Date: 04/05/2014    Order Specific Question:  Reason for Exam (SYMPTOM  OR DIAGNOSIS REQUIRED)    Answer:  lumbar radiculopathy    Order Specific Question:  Preferred imaging location?    Answer:  Internal  . CMP14+EGFR  . NMR, lipoprofile  . POCT glycosylated hemoglobin (Hb A1C)   Meds ordered this encounter  Medications  . HYDROcodone-acetaminophen (LORCET) 5-325 MG per tablet    Sig: Take 1 tablet by  mouth every 6 (six) hours as needed for pain.    Dispense:  90 tablet    Refill:  0  . LORazepam (ATIVAN) 0.5 MG tablet    Sig: Take 1 tablet (0.5 mg total) by mouth every 12 (twelve) hours.    Dispense:  60 tablet    Refill:  1  [facility] reading (PRIMARY) by  Dr.Karlea Mckibbin: mild degenerative changes L5-S1.good alignment.                                Back  Care discussed.  Medications Discontinued During This Encounter  Medication Reason  . clindamycin (CLEOCIN) 300 MG capsule Completed Course  . sulfamethoxazole-trimethoprim (BACTRIM DS) 800-160 MG per tablet Completed Course  . HYDROcodone-acetaminophen (LORCET) 5-325 MG per tablet Reorder  . LORazepam (ATIVAN) 0.5 MG tablet Reorder  Return in about 3 months (around 05/06/2013) for Recheck medical problems.  Corbyn Steedman P. [place] Charon, M.D.

## 2013-02-05 LAB — CMP14+EGFR
ALT: 15 IU/L (ref 0–32)
AST: 21 IU/L (ref 0–40)
Albumin/Globulin Ratio: 1.9 (ref 1.1–2.5)
Albumin: 4.2 g/dL (ref 3.5–4.8)
Alkaline Phosphatase: 77 IU/L (ref 39–117)
BUN/Creatinine Ratio: 19 (ref 11–26)
BUN: 13 mg/dL (ref 8–27)
CO2: 25 mmol/L (ref 18–29)
Calcium: 9.2 mg/dL (ref 8.6–10.2)
Chloride: 103 mmol/L (ref 97–108)
Creatinine, Ser: 0.7 mg/dL (ref 0.57–1.00)
GFR calc Af Amer: 101 mL/min/{1.73_m2} (ref >59)
GFR calc non Af Amer: 87 mL/min/{1.73_m2} (ref >59)
Globulin, Total: 2.2 g/dL (ref 1.5–4.5)
Glucose: 102 mg/dL — ABNORMAL HIGH (ref 65–99)
Potassium: 4.1 mmol/L (ref 3.5–5.2)
Sodium: 142 mmol/L (ref 134–144)
Total Bilirubin: 0.9 mg/dL (ref 0.0–1.2)
Total Protein: 6.4 g/dL (ref 6.0–8.5)

## 2013-02-05 LAB — NMR, LIPOPROFILE
Cholesterol: 127 mg/dL (ref <200)
HDL Cholesterol by NMR: 53 mg/dL (ref >=40)
HDL Particle Number: 33 umol/L (ref >=30.5)
LDL Particle Number: 1020 nmol/L — ABNORMAL HIGH (ref <1000)
LDL Size: 20.9 nm (ref >20.5)
LDLC SERPL CALC-MCNC: 58 mg/dL (ref <100)
LP-IR Score: 55 — ABNORMAL HIGH (ref <=45)
Small LDL Particle Number: 600 nmol/L — ABNORMAL HIGH (ref <=527)
Triglycerides by NMR: 80 mg/dL (ref <150)

## 2013-02-25 ENCOUNTER — Other Ambulatory Visit: Payer: Self-pay | Admitting: *Deleted

## 2013-02-25 DIAGNOSIS — E538 Deficiency of other specified B group vitamins: Secondary | ICD-10-CM

## 2013-02-25 MED ORDER — RANITIDINE HCL 150 MG PO TABS: 150.0000 mg | ORAL_TABLET | Freq: Two times a day (BID) | ORAL | Status: DC

## 2013-05-05 ENCOUNTER — Telehealth: Payer: Self-pay | Admitting: Family Medicine

## 2013-05-05 NOTE — Telephone Encounter (Signed)
ERROR - RS °

## 2013-05-14 ENCOUNTER — Encounter: Payer: Self-pay | Admitting: Family Medicine

## 2013-05-14 ENCOUNTER — Ambulatory Visit (INDEPENDENT_AMBULATORY_CARE_PROVIDER_SITE_OTHER): Payer: Medicare Other | Admitting: Family Medicine

## 2013-05-14 VITALS — BP 139/63 | HR 61 | Temp 97.7°F | Ht 62.0 in | Wt 191.8 lb

## 2013-05-14 DIAGNOSIS — I2510 Atherosclerotic heart disease of native coronary artery without angina pectoris: Secondary | ICD-10-CM

## 2013-05-14 DIAGNOSIS — K219 Gastro-esophageal reflux disease without esophagitis: Secondary | ICD-10-CM

## 2013-05-14 DIAGNOSIS — I10 Essential (primary) hypertension: Secondary | ICD-10-CM

## 2013-05-14 DIAGNOSIS — F3289 Other specified depressive episodes: Secondary | ICD-10-CM

## 2013-05-14 DIAGNOSIS — F329 Major depressive disorder, single episode, unspecified: Secondary | ICD-10-CM

## 2013-05-14 DIAGNOSIS — M1990 Unspecified osteoarthritis, unspecified site: Secondary | ICD-10-CM

## 2013-05-14 DIAGNOSIS — E119 Type 2 diabetes mellitus without complications: Secondary | ICD-10-CM

## 2013-05-14 DIAGNOSIS — F32A Depression, unspecified: Secondary | ICD-10-CM

## 2013-05-14 DIAGNOSIS — E538 Deficiency of other specified B group vitamins: Secondary | ICD-10-CM

## 2013-05-14 DIAGNOSIS — E785 Hyperlipidemia, unspecified: Secondary | ICD-10-CM

## 2013-05-14 LAB — POCT GLYCOSYLATED HEMOGLOBIN (HGB A1C): Hemoglobin A1C: 6.3

## 2013-05-14 MED ORDER — PANTOPRAZOLE SODIUM 40 MG PO TBEC: 40.0000 mg | DELAYED_RELEASE_TABLET | Freq: Every day | ORAL | Status: DC

## 2013-05-14 MED ORDER — RANITIDINE HCL 150 MG PO TABS: 150.0000 mg | ORAL_TABLET | Freq: Two times a day (BID) | ORAL | Status: DC

## 2013-05-14 NOTE — Progress Notes (Signed)
Patient ID: Rhonda Garcia, female   DOB: 03-Jan-1941, 73 y.o.   MRN: [id number] SUBJECTIVE: CC: Chief Complaint  Patient presents with  . Follow-up    3 MONTH FOLLOW UP  WANTS HANDICAP FORM     HPI: Patient is here for follow up of Diabetes Mellitus/CAD/GERD/HTN/HLD: Symptoms evaluated: Denies Nocturia ,Denies Urinary Frequency , denies Blurred vision ,deniesDizziness,denies.Dysuria,denies paresthesias, denies extremity pain or ulcers.Marland Kitchendenies chest pain. has had an annual eye exam. do check the feet. Does check CBGs. Average CBG: hasn't been checking  Denies episodes of hypoglycemia. Does have an emergency hypoglycemic plan. admits toCompliance with medications. Denies Problems with medications.  Past Medical History  Diagnosis Date  . Diabetes mellitus   . CAD (coronary artery disease)   . MI (myocardial infarction)   . GERD (gastroesophageal reflux disease)   . Osteopenia   . Hypertension   . Hyperlipidemia   . Constipation   . Anal fissure     resolved   . Chronic pain   . Osteoarthritis   . DDD (degenerative disc disease)   . Anemia   . Airway compromise     Inability to tolerate continuous positive airway pressure  . Hiatal hernia   . Dyslipidemia   . Sleep apnea   . Osteoporosis   . Thyroid disease   . Hearing loss   . Generalized headaches   . Rectal pain   . Change in voice   . Sebaceous cyst     on back  . Back pain     with left radiculopathy  . H/O sleep apnea   . Hx of myocardial infarction   . Depression   . Hx of peptic ulcer    Past Surgical History  Procedure Laterality Date  . Left knee repair    . Appendectomy    . Complete hysterectomy    . Breast reduction surgery    . Esophagogastroduodenoscopy  06/2007    Small hiatal hernia  . Colonoscopy  06/2007    Friable anal canal and anal papillae. Pancolonic diverticula. Normal terminal ileum. Status post segmental biopsy, descending colon biopsy showed minimal cryptitis. Biopsies felt to be   nonspecific but could be seen with NSAIDs. No features of inflammatory bowel disease. Stool studies were negative.  . Colonoscopy  03/21/2011    Procedure: COLONOSCOPY;  Surgeon: Daneil Dolin, MD;  Location: [facility];  Service: Endoscopy;  Laterality: N/A;  12:30  . Cholecystectomy  2008 - approximate  . Abdominal hysterectomy  2001 - approximate  . Lipoma excision  03/17/2012    Procedure: EXCISION LIPOMA;  Surgeon: Gwenyth Ober, MD;  Location: [facility];  Service: General;  Laterality: Right;  excision of right lower back lipoma  . Coronary angioplasty with stent placement    . Knee surgery      left knee repair   History   Social History  . Marital Status: Married    Spouse Name: N/A    Number of Children: 3  . Years of Education: N/A   Occupational History  . Retired    Social History Main Topics  . Smoking status: Former Smoker -- 0.50 packs/day for 0 years    Types: Cigarettes    Quit date: 03/13/1981  . Smokeless tobacco: Never Used     Comment: quit 30 +years ago  . Alcohol Use: No  . Drug Use: No  . Sexual Activity: Not on file   Other Topics Concern  . Not on file  Social History Narrative   Married   Family History  Problem Relation Age of Onset  . Heart attack Mother   . Hypertension Mother   . Stroke Mother     Deceased, age 59  . Stroke Sister   . Diabetes Sister   . Diabetes Sister   . Cancer Sister     colon cancer, dx at age 65, recurrent this year  . Diabetes Brother     also has a blood disorder   . Melanoma Brother     deceased  . Coronary artery disease Other   . Kidney disease Other   . Colon cancer Neg Hx    Current Outpatient Prescriptions on File Prior to Visit  Medication Sig Dispense Refill  . Alcohol Swabs 70 % PADS by Does not apply route 2 (two) times daily.        Marland Kitchen amLODipine (NORVASC) 5 MG tablet Take 1 tablet (5 mg total) by mouth daily.  90 tablet  3  . aspirin 81 MG tablet Take 81 mg by mouth  daily.      . Blood Glucose Monitoring Suppl (GLUCOCOM MONITOR) W/DEVICE KIT by Does not apply route 2 (two) times daily.        Marland Kitchen ezetimibe (ZETIA) 10 MG tablet Take 1 tablet (10 mg total) by mouth daily.  30 tablet  3  . glucose blood test strip 1 each by Other route 2 (two) times daily. Use as instructed       . HYDROcodone-acetaminophen (LORCET) 5-325 MG per tablet Take 1 tablet by mouth every 6 (six) hours as needed for pain.  90 tablet  0  . isosorbide mononitrate (IMDUR) 30 MG 24 hr tablet Take 30 mg by mouth daily.      Marland Kitchen ketoconazole (NIZORAL) 2 % cream       . Lancets MISC by Does not apply route 2 (two) times daily.        Marland Kitchen lisinopril-hydrochlorothiazide (PRINZIDE,ZESTORETIC) 20-25 MG per tablet Take 1 tablet by mouth daily.  90 tablet  3  . LORazepam (ATIVAN) 0.5 MG tablet Take 1 tablet (0.5 mg total) by mouth every 12 (twelve) hours.  60 tablet  1  . meclizine (ANTIVERT) 25 MG tablet Take 1 tablet (25 mg total) by mouth 3 (three) times daily as needed.  30 tablet  0  . metFORMIN (GLUCOPHAGE) 500 MG tablet Take 2 tablets (1,000 mg total) by mouth daily with breakfast.  90 tablet  3  . metoprolol (LOPRESSOR) 50 MG tablet Take 1 tablet (50 mg total) by mouth 2 (two) times daily.  180 tablet  3  . nitroGLYCERIN (NITROSTAT) 0.4 MG SL tablet Place 0.4 mg under the tongue every 5 (five) minutes as needed.        . NON FORMULARY Take 1,500 mg by mouth every other day. Lipozene      . ondansetron (ZOFRAN) 8 MG tablet Take 1 tablet (8 mg total) by mouth every 8 (eight) hours as needed for nausea.  20 tablet  1  . pioglitazone (ACTOS) 30 MG tablet Take 1 tablet (30 mg total) by mouth daily.  90 tablet  3  . polyethylene glycol powder (CLEARLAX) powder Take 17 g by mouth as needed.      . pravastatin (PRAVACHOL) 40 MG tablet Take 1 tablet (40 mg total) by mouth at bedtime.  90 tablet  3  . sertraline (ZOLOFT) 50 MG tablet Take 1 tablet (50 mg total) by mouth daily.  Two [place]  tablet  3   No current  facility-administered medications on file prior to visit.   Allergies  Allergen Reactions  . Promethazine Hcl Anaphylaxis  . Penicillins   . Promethazine Hcl Other (See Comments)   Immunization History  Administered Date(s) Administered  . Influenza,inj,Quad PF,36+ Mos 01/19/2013  . Tdap 12/18/2012   Prior to Admission medications   Medication Sig Start Date End Date Taking? Authorizing Provider  Alcohol Swabs 70 % PADS by Does not apply route 2 (two) times daily.      Historical Provider, MD  amLODipine (NORVASC) 5 MG tablet Take 1 tablet (5 mg total) by mouth daily. 10/28/12   Vernie Shanks, MD  aspirin 81 MG tablet Take 81 mg by mouth daily.    Historical Provider, MD  Blood Glucose Monitoring Suppl (GLUCOCOM MONITOR) W/DEVICE KIT by Does not apply route 2 (two) times daily.      Historical Provider, MD  ezetimibe (ZETIA) 10 MG tablet Take 1 tablet (10 mg total) by mouth daily. 10/28/12   Vernie Shanks, MD  glucose blood test strip 1 each by Other route 2 (two) times daily. Use as instructed     Historical Provider, MD  HYDROcodone-acetaminophen (LORCET) 5-325 MG per tablet Take 1 tablet by mouth every 6 (six) hours as needed for pain. 02/03/13   Vernie Shanks, MD  isosorbide mononitrate (IMDUR) 30 MG 24 hr tablet Take 30 mg by mouth daily.    Historical Provider, MD  ketoconazole (NIZORAL) 2 % cream  10/28/12   Historical Provider, MD  Lancets MISC by Does not apply route 2 (two) times daily.      Historical Provider, MD  lisinopril-hydrochlorothiazide (PRINZIDE,ZESTORETIC) 20-25 MG per tablet Take 1 tablet by mouth daily. 10/28/12   Vernie Shanks, MD  LORazepam (ATIVAN) 0.5 MG tablet Take 1 tablet (0.5 mg total) by mouth every 12 (twelve) hours. 02/03/13   Vernie Shanks, MD  meclizine (ANTIVERT) 25 MG tablet Take 1 tablet (25 mg total) by mouth 3 (three) times daily as needed. 11/27/12   Lysbeth Penner, FNP  metFORMIN (GLUCOPHAGE) 500 MG tablet Take 2 tablets (1,000 mg total) by  mouth daily with breakfast. 10/28/12   Vernie Shanks, MD  metoprolol (LOPRESSOR) 50 MG tablet Take 1 tablet (50 mg total) by mouth 2 (two) times daily. 10/28/12   Vernie Shanks, MD  nitroGLYCERIN (NITROSTAT) 0.4 MG SL tablet Place 0.4 mg under the tongue every 5 (five) minutes as needed.      Historical Provider, MD  NON FORMULARY Take 1,500 mg by mouth every other day. [place]    Historical Provider, MD  ondansetron (ZOFRAN) 8 MG tablet Take 1 tablet (8 mg total) by mouth every 8 (eight) hours as needed for nausea. 11/27/12   Lysbeth Penner, FNP  pantoprazole (PROTONIX) 40 MG tablet Take 1 tablet (40 mg total) by mouth daily. 12/29/12 05/29/14  Vernie Shanks, MD  pioglitazone (ACTOS) 30 MG tablet Take 1 tablet (30 mg total) by mouth daily. 10/28/12   Vernie Shanks, MD  polyethylene glycol powder (CLEARLAX) powder Take 17 g by mouth as needed.    Historical Provider, MD  pravastatin (PRAVACHOL) 40 MG tablet Take 1 tablet (40 mg total) by mouth at bedtime. 10/28/12   Vernie Shanks, MD  ranitidine (ZANTAC) 150 MG tablet Take 1 tablet (150 mg total) by mouth 2 (two) times daily. 02/25/13   Lysbeth Penner, FNP  sertraline (ZOLOFT) 50 MG tablet  Take 1 tablet (50 mg total) by mouth daily. 10/28/12   Vernie Shanks, MD     ROS: As above in the HPI. All other systems are stable or negative.  OBJECTIVE: APPEARANCE:  Patient in no acute distress.The patient appeared well nourished and normally developed. Acyanotic. Waist: VITAL SIGNS:BP 139/63  Pulse 61  Temp(Src) 97.7 F (36.5 C) (Oral)  Ht 5\' 2"  (1.575 m)  Wt 191 lb 12.8 oz (87 kg)  BMI 35.07 kg/m2  OBESE WF SKIN: warm and  Dry without overt rashes, tattoos and scars  HEAD and Neck: without JVD, Head and scalp: normal Eyes:No scleral icterus. Fundi normal, eye movements normal. Ears: Auricle normal, canal normal, Tympanic membranes normal, insufflation normal. Nose: normal Throat: normal Neck & thyroid: normal  CHEST &  LUNGS: Chest wall: normal Lungs: Clear  CVS: Reveals the PMI to be normally located. Regular rhythm, First and Second Heart sounds are normal,  absence of murmurs, rubs or gallops. Peripheral vasculature: Radial pulses: normal Dorsal pedis pulses: normal Posterior pulses: normal  ABDOMEN:  Appearance: normal Benign, no organomegaly, no masses, no Abdominal Aortic enlargement. No Guarding , no rebound. No Bruits. Bowel sounds: normal  RECTAL: N/A GU: N/A  EXTREMETIES: nonedematous.  MUSCULOSKELETAL:  Spine: normal Joints: intact  NEUROLOGIC: oriented to time,place and person; nonfocal. Strength is normal Sensory is normal Reflexes are normal Cranial Nerves are normal.  ASSESSMENT:  Type II or unspecified type diabetes mellitus without mention of complication, not stated as uncontrolled - Plan: POCT glycosylated hemoglobin (Hb A1C), CMP14+EGFR  HYPERTENSION, UNSPECIFIED - Plan: CMP14+EGFR  HYPERLIPIDEMIA-MIXED - Plan: CMP14+EGFR, NMR, lipoprofile  CAD, NATIVE VESSEL  OSTEOARTHRITIS  Gastroesophageal reflux  Depression  GERD (gastroesophageal reflux disease) - Plan: pantoprazole (PROTONIX) 40 MG tablet, ranitidine (ZANTAC) 150 MG tablet  B12 deficiency  PLAN:      Dr Paula Libra Recommendations  For nutrition information, I recommend books:  1).Eat to Live by Dr Excell Seltzer. 2).Prevent and Reverse Heart Disease by Dr Karl Luke. 3) Dr Janene Harvey Book:  Program to Reverse Diabetes  Exercise recommendations are:  If unable to walk, then the patient can exercise in a chair 3 times a day. By flapping arms like a bird gently and raising legs outwards to the front.  If ambulatory, the patient can go for walks for 30 minutes 3 times a week. Then increase the intensity and duration as tolerated.  Goal is to try to attain exercise frequency to 5 times a week.  If applicable: Best to perform resistance exercises (machines or weights) 2 days a  week and cardio type exercises 3 days per week.  DM foot care. In the AVS.  Orders Placed This Encounter  Procedures  . CMP14+EGFR  . NMR, lipoprofile  . POCT glycosylated hemoglobin (Hb A1C)   Meds ordered this encounter  Medications  . pantoprazole (PROTONIX) 40 MG tablet    Sig: Take 1 tablet (40 mg total) by mouth daily.    Dispense:  30 tablet    Refill:  5  . ranitidine (ZANTAC) 150 MG tablet    Sig: Take 1 tablet (150 mg total) by mouth 2 (two) times daily.    Dispense:  60 tablet    Refill:  5    Order Specific Question:  Supervising Provider    Answer:  Chipper Herb [[id number]]   Medications Discontinued During This Encounter  Medication Reason  . pantoprazole (PROTONIX) 40 MG tablet Reorder  . ranitidine (ZANTAC) 150 MG tablet Reorder  Return in about 3 months (around 08/11/2013) for Recheck medical problems.  Anne Boltz P. Jacelyn Grip, M.D.

## 2013-05-14 NOTE — Patient Instructions (Signed)
    Dr Khadijatou Borak's Recommendations  For nutrition information, I recommend books:  1).Eat to Live by Dr Joel Fuhrman. 2).Prevent and Reverse Heart Disease by Dr Caldwell Esselstyn. 3) Dr Neal Barnard's Book:  Program to Reverse Diabetes  Exercise recommendations are:  If unable to walk, then the patient can exercise in a chair 3 times a day. By flapping arms like a bird gently and raising legs outwards to the front.  If ambulatory, the patient can go for walks for 30 minutes 3 times a week. Then increase the intensity and duration as tolerated.  Goal is to try to attain exercise frequency to 5 times a week.  If applicable: Best to perform resistance exercises (machines or weights) 2 days a week and cardio type exercises 3 days per week.   Diabetes and Foot Care Diabetes may cause you to have problems because of poor blood supply (circulation) to your feet and legs. This may cause the skin on your feet to become thinner, break easier, and heal more slowly. Your skin may become dry, and the skin may peel and crack. You may also have nerve damage in your legs and feet causing decreased feeling in them. You may not notice minor injuries to your feet that could lead to infections or more serious problems. Taking care of your feet is one of the most important things you can do for yourself.  HOME CARE INSTRUCTIONS  Wear shoes at all times, even in the house. Do not go barefoot. Bare feet are easily injured.  Check your feet daily for blisters, cuts, and redness. If you cannot see the bottom of your feet, use a mirror or ask someone for help.  Wash your feet with warm water (do not use hot water) and mild soap. Then pat your feet and the areas between your toes until they are completely dry. Do not soak your feet as this can dry your skin.  Apply a moisturizing lotion or petroleum jelly (that does not contain alcohol and is unscented) to the skin on your feet and to dry, brittle toenails.  Do not apply lotion between your toes.  Trim your toenails straight across. Do not dig under them or around the cuticle. File the edges of your nails with an emery board or nail file.  Do not cut corns or calluses or try to remove them with medicine.  Wear clean socks or stockings every day. Make sure they are not too tight. Do not wear knee-high stockings since they may decrease blood flow to your legs.  Wear shoes that fit properly and have enough cushioning. To break in new shoes, wear them for just a few hours a day. This prevents you from injuring your feet. Always look in your shoes before you put them on to be sure there are no objects inside.  Do not cross your legs. This may decrease the blood flow to your feet.  If you find a minor scrape, cut, or break in the skin on your feet, keep it and the skin around it clean and dry. These areas may be cleansed with mild soap and water. Do not cleanse the area with peroxide, alcohol, or iodine.  When you remove an adhesive bandage, be sure not to damage the skin around it.  If you have a wound, look at it several times a day to make sure it is healing.  Do not use heating pads or hot water bottles. They may burn your skin. If   you have lost feeling in your feet or legs, you may not know it is happening until it is too late.  Make sure your health care provider performs a complete foot exam at least annually or more often if you have foot problems. Report any cuts, sores, or bruises to your health care provider immediately. SEEK MEDICAL CARE IF:   You have an injury that is not healing.  You have cuts or breaks in the skin.  You have an ingrown nail.  You notice redness on your legs or feet.  You feel burning or tingling in your legs or feet.  You have pain or cramps in your legs and feet.  Your legs or feet are numb.  Your feet always feel cold. SEEK IMMEDIATE MEDICAL CARE IF:   There is increasing redness, swelling, or pain in  or around a wound.  There is a red line that goes up your leg.  Pus is coming from a wound.  You develop a fever or as directed by your health care provider.  You notice a bad smell coming from an ulcer or wound. Document Released: 03/23/2000 Document Revised: 11/26/2012 Document Reviewed: 09/02/2012 [employer] Patient Information 2014 [employer].  

## 2013-05-15 LAB — CMP14+EGFR
ALT: 15 IU/L (ref 0–32)
AST: 18 IU/L (ref 0–40)
Albumin/Globulin Ratio: 2 (ref 1.1–2.5)
Albumin: 4.3 g/dL (ref 3.5–4.8)
Alkaline Phosphatase: 73 IU/L (ref 39–117)
BUN/Creatinine Ratio: 18 (ref 11–26)
BUN: 14 mg/dL (ref 8–27)
CO2: 28 mmol/L (ref 18–29)
Calcium: 9.9 mg/dL (ref 8.7–10.3)
Chloride: 100 mmol/L (ref 97–108)
Creatinine, Ser: 0.76 mg/dL (ref 0.57–1.00)
GFR calc Af Amer: 91 mL/min/{1.73_m2} (ref >59)
GFR calc non Af Amer: 79 mL/min/{1.73_m2} (ref >59)
Globulin, Total: 2.2 g/dL (ref 1.5–4.5)
Glucose: 102 mg/dL — ABNORMAL HIGH (ref 65–99)
Potassium: 4.2 mmol/L (ref 3.5–5.2)
Sodium: 142 mmol/L (ref 134–144)
Total Bilirubin: 0.8 mg/dL (ref 0.0–1.2)
Total Protein: 6.5 g/dL (ref 6.0–8.5)

## 2013-05-15 LAB — NMR, LIPOPROFILE
Cholesterol: 132 mg/dL (ref <200)
HDL Cholesterol by NMR: 51 mg/dL (ref >=40)
HDL Particle Number: 34.1 umol/L (ref >=30.5)
LDL Particle Number: 966 nmol/L (ref <1000)
LDL Size: 21.2 nm (ref >20.5)
LDLC SERPL CALC-MCNC: 65 mg/dL (ref <100)
LP-IR Score: 34 (ref <=45)
Small LDL Particle Number: 411 nmol/L (ref <=527)
Triglycerides by NMR: 80 mg/dL (ref <150)

## 2013-07-14 ENCOUNTER — Other Ambulatory Visit: Payer: Self-pay | Admitting: Family Medicine

## 2013-07-15 NOTE — Telephone Encounter (Signed)
Last refill 03/30/13. Last ov 05/14/13. If approved call in [facility].

## 2013-07-15 NOTE — Telephone Encounter (Signed)
Last ov 05/14/13. Last refill 04/14/13.

## 2013-07-17 NOTE — Telephone Encounter (Signed)
Call patient : Prescription refilled & sent to pharmacy in [facility]. 

## 2013-07-17 NOTE — Telephone Encounter (Signed)
Rx ready for nurse to Phone in. 

## 2013-08-11 ENCOUNTER — Other Ambulatory Visit: Payer: Self-pay | Admitting: Family Medicine

## 2013-08-13 ENCOUNTER — Ambulatory Visit (INDEPENDENT_AMBULATORY_CARE_PROVIDER_SITE_OTHER): Payer: Medicare Other | Admitting: Family Medicine

## 2013-08-13 ENCOUNTER — Encounter: Payer: Self-pay | Admitting: Family Medicine

## 2013-08-13 VITALS — BP 133/60 | HR 60 | Temp 98.7°F | Ht 62.0 in | Wt 191.4 lb

## 2013-08-13 DIAGNOSIS — I10 Essential (primary) hypertension: Secondary | ICD-10-CM

## 2013-08-13 DIAGNOSIS — I2510 Atherosclerotic heart disease of native coronary artery without angina pectoris: Secondary | ICD-10-CM

## 2013-08-13 DIAGNOSIS — M1990 Unspecified osteoarthritis, unspecified site: Secondary | ICD-10-CM

## 2013-08-13 DIAGNOSIS — R002 Palpitations: Secondary | ICD-10-CM

## 2013-08-13 DIAGNOSIS — K219 Gastro-esophageal reflux disease without esophagitis: Secondary | ICD-10-CM

## 2013-08-13 DIAGNOSIS — E785 Hyperlipidemia, unspecified: Secondary | ICD-10-CM

## 2013-08-13 DIAGNOSIS — Z23 Encounter for immunization: Secondary | ICD-10-CM

## 2013-08-13 DIAGNOSIS — E119 Type 2 diabetes mellitus without complications: Secondary | ICD-10-CM

## 2013-08-13 LAB — POCT GLYCOSYLATED HEMOGLOBIN (HGB A1C): Hemoglobin A1C: 6.3

## 2013-08-13 NOTE — Patient Instructions (Signed)
Palpitations  A palpitation is the feeling that your heartbeat is irregular or is faster than normal. It may feel like your heart is fluttering or skipping a beat. Palpitations are usually not a serious problem. However, in some cases, you may need further medical evaluation. CAUSES  Palpitations can be caused by:  Smoking.  Caffeine or other stimulants, such as diet pills or energy drinks.  Alcohol.  Stress and anxiety.  Strenuous physical activity.  Fatigue.  Certain medicines.  Heart disease, especially if you have a history of arrhythmias. This includes atrial fibrillation, atrial flutter, or supraventricular tachycardia.  An improperly working pacemaker or defibrillator. DIAGNOSIS  To find the cause of your palpitations, your caregiver will take your history and perform a physical exam. Tests may also be done, including:  Electrocardiography (ECG). This test records the heart's electrical activity.  Cardiac monitoring. This allows your caregiver to monitor your heart rate and rhythm in real time.  Holter monitor. This is a portable device that records your heartbeat and can help diagnose heart arrhythmias. It allows your caregiver to track your heart activity for several days, if needed.  Stress tests by exercise or by giving medicine that makes the heart beat faster. TREATMENT  Treatment of palpitations depends on the cause of your symptoms and can vary greatly. Most cases of palpitations do not require any treatment other than time, relaxation, and monitoring your symptoms. Other causes, such as atrial fibrillation, atrial flutter, or supraventricular tachycardia, usually require further treatment. HOME CARE INSTRUCTIONS   Avoid:  Caffeinated coffee, tea, soft drinks, diet pills, and energy drinks.  Chocolate.  Alcohol.  Stop smoking if you smoke.  Reduce your stress and anxiety. Things that can help you relax include:  A method that measures bodily functions so  you can learn to control them (biofeedback).  Yoga.  Meditation.  Physical activity such as swimming, jogging, or walking.  Get plenty of rest and sleep. SEEK MEDICAL CARE IF:   You continue to have a fast or irregular heartbeat beyond 24 hours.  Your palpitations occur more often. SEEK IMMEDIATE MEDICAL CARE IF:  You develop chest pain or shortness of breath.  You have a severe headache.  You feel dizzy, or you faint. MAKE SURE YOU:  Understand these instructions.  Will watch your condition.  Will get help right away if you are not doing well or get worse. Document Released: 03/23/2000 Document Revised: 07/21/2012 Document Reviewed: 05/25/2011 [facility] Patient Information 2014 [place].   Atrial Fibrillation Atrial fibrillation is a type of irregular heart rhythm (arrhythmia). During atrial fibrillation, the upper chambers of the heart (atria) quiver continuously in a chaotic pattern. This causes an irregular and often rapid heart rate.  Atrial fibrillation is the result of the heart becoming overloaded with disorganized signals that tell it to beat. These signals are normally released one at a time by a part of the right atrium called the sinoatrial node. They then travel from the atria to the lower chambers of the heart (ventricles), causing the atria and ventricles to contract and pump blood as they pass. In atrial fibrillation, parts of the atria outside of the sinoatrial node also release these signals. This results in two problems. First, the atria receive so many signals that they do not have time to fully contract. Second, the ventricles, which can only receive one signal at a time, beat irregularly and out of rhythm with the atria.  There are three types of atrial fibrillation:   Paroxysmal  Paroxysmal atrial fibrillation starts suddenly and stops on its own within a week.   Persistent Persistent atrial fibrillation lasts for more than a week. It may stop on  its own or with treatment.   Permanent Permanent atrial fibrillation does not go away. Episodes of atrial fibrillation may lead to permanent atrial fibrillation.  Atrial fibrillation can prevent your heart from pumping blood normally. It increases your risk of stroke and can lead to heart failure.  CAUSES   Heart conditions, including a heart attack, heart failure, coronary artery disease, and heart valve conditions.   Inflammation of the sac that surrounds the heart (pericarditis).   Blockage of an artery in the lungs (pulmonary embolism).   Pneumonia or other infections.   Chronic lung disease.   Thyroid problems, especially if the thyroid is overactive (hyperthyroidism).   Caffeine, excessive alcohol use, and use of some illegal drugs.   Use of some medications, including certain decongestants and diet pills.   Heart surgery.   Birth defects.  Sometimes, no cause can be found. When this happens, the atrial fibrillation is called lone atrial fibrillation. The risk of complications from atrial fibrillation increases if you have lone atrial fibrillation and you are age 86 years or older. RISK FACTORS  Heart failure.  Coronary artery disease  Diabetes mellitus.   High blood pressure (hypertension).   Obesity.   Other arrhythmias.   Increased age. SYMPTOMS   A feeling that your heart is beating rapidly or irregularly.   A feeling of discomfort or pain in your chest.   Shortness of breath.   Sudden lightheadedness or weakness.   Getting tired easily when exercising.   Urinating more often than normal (mainly when atrial fibrillation first begins).  In paroxysmal atrial fibrillation, symptoms may start and suddenly stop. DIAGNOSIS  Your caregiver may be able to detect atrial fibrillation when taking your pulse. Usually, testing is needed to diagnosis atrial fibrillation. Tests may include:   Electrocardiography. During this test, the  electrical impulses of your heart are recorded while you are lying down.   Echocardiography. During echocardiography, sound waves are used to evaluate how blood flows through your heart.   Stress test. There is more than one type of stress test. If a stress test is needed, ask your caregiver about which type is best for you.   Chest X-ray exam.   Blood tests.   Computed tomography (CT).  TREATMENT   Treating any underlying conditions. For example, if you have an overactive thyroid, treating the condition may correct atrial fibrillation.   Medication. Medications may be given to control a rapid heart rate or to prevent blood clots, heart failure, or a stroke.   Procedure to correct the rhythm of the heart:  Electrical cardioversion. During electrical cardioversion, a controlled, low-energy shock is delivered to the heart through your skin. If you have chest pain, very low pressure blood pressure, or sudden heart failure, this procedure may need to be done as an emergency.  Catheter ablation. During this procedure, heart tissues that send the signals that cause atrial fibrillation are destroyed.  Maze or minimaze procedure. During this surgery, thin lines of heart tissue that carry the abnormal signals are destroyed. The maze procedure is an open-heart surgery. The minimaze procedure is a minimally invasive surgery. This means that small cuts are made to access the heart instead of a large opening.  Pulmonary venous isolation. During this surgery, tissue around the veins that carry blood from the lungs (pulmonary veins)  is destroyed. This tissue is thought to carry the abnormal signals. HOME CARE INSTRUCTIONS   Take medications as directed by your caregiver.  Only take medications that your caregiver approves. Some medications can make atrial fibrillation worse or recur.  If blood thinners were prescribed by your caregiver, take them exactly as directed. Too much can cause  bleeding. Too little and you will not have the needed protection against stroke and other problems.  Perform blood tests at home if directed by your caregiver.  Perform blood tests exactly as directed.   Quit smoking if you smoke.   Do not drink alcohol.   Do not drink caffeinated beverages such as coffee, soda, and some teas. You may drink decaffeinated coffee, soda, or tea.   Maintain a healthy weight. Do not use diet pills unless your caregiver approves. They may make heart problems worse.   Follow diet instructions as directed by your caregiver.   Exercise regularly as directed by your caregiver.   Keep all follow-up appointments. PREVENTION  The following substances can cause atrial fibrillation to recur:   Caffeinated beverages.   Alcohol.   Certain medications, especially those used for breathing problems.   Certain herbs and herbal medications, such as those containing ephedra or ginseng.  Illegal drugs such as cocaine and amphetamines. Sometimes medications are given to prevent atrial fibrillation from recurring. Proper treatment of any underlying condition is also important in helping prevent recurrence.  SEEK MEDICAL CARE IF:  You notice a change in the rate, rhythm, or strength of your heartbeat.   You suddenly begin urinating more frequently.   You tire more easily when exerting yourself or exercising.  SEEK IMMEDIATE MEDICAL CARE IF:   You develop chest pain, abdominal pain, sweating, or weakness.  You feel sick to your stomach (nauseous).  You develop shortness of breath.  You suddenly develop swollen feet and ankles.  You feel dizzy.  You face or limbs feel numb or weak.  There is a change in your vision or speech. MAKE SURE YOU:   Understand these instructions.  Will watch your condition.  Will get help right away if you are not doing well or get worse. Document Released: 03/26/2005 Document Revised: 07/21/2012 Document  Reviewed: 05/06/2012 [facility] Patient Information 2014 [place].   Diabetes and Foot Care Diabetes may cause you to have problems because of poor blood supply (circulation) to your feet and legs. This may cause the skin on your feet to become thinner, break easier, and heal more slowly. Your skin may become dry, and the skin may peel and crack. You may also have nerve damage in your legs and feet causing decreased feeling in them. You may not notice minor injuries to your feet that could lead to infections or more serious problems. Taking care of your feet is one of the most important things you can do for yourself.  HOME CARE INSTRUCTIONS  Wear shoes at all times, even in the house. Do not go barefoot. Bare feet are easily injured.  Check your feet daily for blisters, cuts, and redness. If you cannot see the bottom of your feet, use a mirror or ask someone for help.  Wash your feet with warm water (do not use hot water) and mild soap. Then pat your feet and the areas between your toes until they are completely dry. Do not soak your feet as this can dry your skin.  Apply a moisturizing lotion or petroleum jelly (that does not contain alcohol and  is unscented) to the skin on your feet and to dry, brittle toenails. Do not apply lotion between your toes.  Trim your toenails straight across. Do not dig under them or around the cuticle. File the edges of your nails with an emery board or nail file.  Do not cut corns or calluses or try to remove them with medicine.  Wear clean socks or stockings every day. Make sure they are not too tight. Do not wear knee-high stockings since they may decrease blood flow to your legs.  Wear shoes that fit properly and have enough cushioning. To break in new shoes, wear them for just a few hours a day. This prevents you from injuring your feet. Always look in your shoes before you put them on to be sure there are no objects inside.  Do not cross your legs.  This may decrease the blood flow to your feet.  If you find a minor scrape, cut, or break in the skin on your feet, keep it and the skin around it clean and dry. These areas may be cleansed with mild soap and water. Do not cleanse the area with peroxide, alcohol, or iodine.  When you remove an adhesive bandage, be sure not to damage the skin around it.  If you have a wound, look at it several times a day to make sure it is healing.  Do not use heating pads or hot water bottles. They may burn your skin. If you have lost feeling in your feet or legs, you may not know it is happening until it is too late.  Make sure your health care provider performs a complete foot exam at least annually or more often if you have foot problems. Report any cuts, sores, or bruises to your health care provider immediately. SEEK MEDICAL CARE IF:   You have an injury that is not healing.  You have cuts or breaks in the skin.  You have an ingrown nail.  You notice redness on your legs or feet.  You feel burning or tingling in your legs or feet.  You have pain or cramps in your legs and feet.  Your legs or feet are numb.  Your feet always feel cold. SEEK IMMEDIATE MEDICAL CARE IF:   There is increasing redness, swelling, or pain in or around a wound.  There is a red line that goes up your leg.  Pus is coming from a wound.  You develop a fever or as directed by your health care provider.  You notice a bad smell coming from an ulcer or wound. Document Released: 03/23/2000 Document Revised: 11/26/2012 Document Reviewed: 09/02/2012 [facility] Patient Information 2014 [place].   DASH Diet The DASH diet stands for "Dietary Approaches to Stop Hypertension." It is a healthy eating plan that has been shown to reduce high blood pressure (hypertension) in as little as 14 days, while also possibly providing other significant health benefits. These other health benefits include reducing the risk of breast  cancer after menopause and reducing the risk of type 2 diabetes, heart disease, colon cancer, and stroke. Health benefits also include weight loss and slowing kidney failure in patients with chronic kidney disease.  DIET GUIDELINES  Limit salt (sodium). Your diet should contain less than 1500 mg of sodium daily.  Limit refined or processed carbohydrates. Your diet should include mostly whole grains. Desserts and added sugars should be used sparingly.  Include small amounts of heart-healthy fats. These types of fats include nuts, oils,  and tub margarine. Limit saturated and trans fats. These fats have been shown to be harmful in the body. CHOOSING FOODS  The following food groups are based on a 2000 calorie diet. See your Registered Dietitian for individual calorie needs. Grains and Grain Products (6 to 8 servings daily)  Eat More Often: Whole-wheat bread, brown rice, whole-grain or wheat pasta, quinoa, popcorn without added fat or salt (air popped).  Eat Less Often: White bread, white pasta, white rice, cornbread. Vegetables (4 to 5 servings daily)  Eat More Often: Fresh, frozen, and canned vegetables. Vegetables may be raw, steamed, roasted, or grilled with a minimal amount of fat.  Eat Less Often/Avoid: Creamed or fried vegetables. Vegetables in a cheese sauce. Fruit (4 to 5 servings daily)  Eat More Often: All fresh, canned (in natural juice), or frozen fruits. Dried fruits without added sugar. One hundred percent fruit juice ( cup [237 mL] daily).  Eat Less Often: Dried fruits with added sugar. Canned fruit in light or heavy syrup. [employer], Fish, and Poultry (2 servings or less daily. One serving is 3 to 4 oz [85-114 g]).  Eat More Often: Ninety percent or leaner ground beef, tenderloin, sirloin. Round cuts of beef, chicken breast, [place] breast. All fish. Grill, bake, or broil your meat. Nothing should be fried.  Eat Less Often/Avoid: Fatty cuts of meat, [place], or chicken leg,  thigh, or wing. Fried cuts of meat or fish. Dairy (2 to 3 servings)  Eat More Often: Low-fat or fat-free milk, low-fat plain or light yogurt, reduced-fat or part-skim cheese.  Eat Less Often/Avoid: Milk (whole, 2%).Whole milk yogurt. Full-fat cheeses. Nuts, Seeds, and Legumes (4 to 5 servings per week)  Eat More Often: All without added salt.  Eat Less Often/Avoid: Salted nuts and seeds, canned beans with added salt. Fats and Sweets (limited)  Eat More Often: Vegetable oils, tub margarines without trans fats, sugar-free gelatin. Mayonnaise and salad dressings.  Eat Less Often/Avoid: Coconut oils, palm oils, butter, stick margarine, cream, half and half, cookies, candy, pie. FOR MORE INFORMATION The Dash Diet Eating Plan: www.dashdiet.org Document Released: 03/15/2011 Document Revised: 06/18/2011 Document Reviewed: 03/15/2011 [facility] Patient Information 2014 [place], [place].        Dr Paula Libra Recommendations  For nutrition information, I recommend books:  1).Eat to Live by Dr Excell Seltzer. 2).Prevent and Reverse Heart Disease by Dr Karl Luke. 3) Dr Janene Harvey Book:  Program to Reverse Diabetes  Exercise recommendations are:  If unable to walk, then the patient can exercise in a chair 3 times a day. By flapping arms like a bird gently and raising legs outwards to the front.  If ambulatory, the patient can go for walks for 30 minutes 3 times a week. Then increase the intensity and duration as tolerated.  Goal is to try to attain exercise frequency to 5 times a week.  If applicable: Best to perform resistance exercises (machines or weights) 2 days a week and cardio type exercises 3 days per week.

## 2013-08-13 NOTE — Telephone Encounter (Signed)
Rx ready for nurse to Phone in. 

## 2013-08-13 NOTE — Telephone Encounter (Signed)
Phoned in to pharmacy. 

## 2013-08-13 NOTE — Telephone Encounter (Signed)
Last seen 05/24/13, last filled 03/30/13. If approved route to [facility] can b called into [facility]

## 2013-08-13 NOTE — Progress Notes (Signed)
Patient ID: Rhonda Garcia, female   DOB: 1940-11-21, 73 y.o.   MRN: [id number] SUBJECTIVE: CC: Chief Complaint  Patient presents with  . Diabetes    follow up  . Hypertension  . Hyperlipidemia    HPI: Patient is here for follow up of Diabetes Mellitus/HTN/HLD Symptoms evaluated: Denies Nocturia ,Denies Urinary Frequency , denies Blurred vision ,deniesDizziness,denies.Dysuria,denies paresthesias, denies extremity pain or ulcers.Marland Kitchendenies chest pain. has had an annual eye exam. do check the feet. Does check CBGs. Average EHM:CNOBSJG medications and just got back on them Denies episodes of hypoglycemia. Does have an emergency hypoglycemic plan. admits toCompliance with medications. Denies Problems with medications.   Stopped all medications and her heart went into a fast beat and she ended up in the [facility] hospital with atrial fibrillation per patient's history.was hospitalized  2 days. Discharged and told to follow up with PCP. No anticoagulation initiated.   Back on her medications. Her medications makes her feel tired. Was hospitalized for 2 days. Not discharged on anticoagulation. No new medications. Told to see her PCP.   Past Medical History  Diagnosis Date  . Diabetes mellitus   . CAD (coronary artery disease)   . MI (myocardial infarction)   . GERD (gastroesophageal reflux disease)   . Osteopenia   . Hypertension   . Hyperlipidemia   . Constipation   . Anal fissure     resolved   . Chronic pain   . Osteoarthritis   . DDD (degenerative disc disease)   . Anemia   . Airway compromise     Inability to tolerate continuous positive airway pressure  . Hiatal hernia   . Dyslipidemia   . Sleep apnea   . Osteoporosis   . Thyroid disease   . Hearing loss   . Generalized headaches   . Rectal pain   . Change in voice   . Sebaceous cyst     on back  . Back pain     with left radiculopathy  . H/O sleep apnea   . Hx of myocardial infarction   . Depression   . Hx  of peptic ulcer    Past Surgical History  Procedure Laterality Date  . Left knee repair    . Appendectomy    . Complete hysterectomy    . Breast reduction surgery    . Esophagogastroduodenoscopy  06/2007    Small hiatal hernia  . Colonoscopy  06/2007    Friable anal canal and anal papillae. Pancolonic diverticula. Normal terminal ileum. Status post segmental biopsy, descending colon biopsy showed minimal cryptitis. Biopsies felt to be  nonspecific but could be seen with NSAIDs. No features of inflammatory bowel disease. Stool studies were negative.  . Colonoscopy  03/21/2011    Procedure: COLONOSCOPY;  Surgeon: Daneil Dolin, MD;  Location[facility];  Service: Endoscopy;  Laterality: N/A;  12:30  . Cholecystectomy  2008 - approximate  . Abdominal hysterectomy  2001 - approximate  . Lipoma excision  03/17/2012    Procedure: EXCISION LIPOMA;  Surgeon: Gwenyth Ober, MD;  Location: [place];  Service: General;  Laterality: Right;  excision of right lower back lipoma  . Coronary angioplasty with stent placement    . Knee surgery      left knee repair   History   Social History  . Marital Status: Married    Spouse Name: N/A    Number of Children: 3  . Years of Education: N/A   Occupational History  .  Retired    Social History Main Topics  . Smoking status: Former Smoker -- 0.50 packs/day for 0 years    Types: Cigarettes    Quit date: 03/13/1981  . Smokeless tobacco: Never Used     Comment: quit 30 +years ago  . Alcohol Use: No  . Drug Use: No  . Sexual Activity: Not on file   Other Topics Concern  . Not on file   Social History Narrative   Married   Family History  Problem Relation Age of Onset  . Heart attack Mother   . Hypertension Mother   . Stroke Mother     Deceased, age 109  . Stroke Sister   . Diabetes Sister   . Diabetes Sister   . Cancer Sister     colon cancer, dx at age 16, recurrent this year  . Diabetes Brother     also has a  blood disorder   . Melanoma Brother     deceased  . Coronary artery disease Other   . Kidney disease Other   . Colon cancer Neg Hx    Current Outpatient Prescriptions on File Prior to Visit  Medication Sig Dispense Refill  . amLODipine (NORVASC) 5 MG tablet Take 1 tablet (5 mg total) by mouth daily.  90 tablet  3  . aspirin 81 MG tablet Take 81 mg by mouth daily.      . Blood Glucose Monitoring Suppl (GLUCOCOM MONITOR) W/DEVICE KIT by Does not apply route 2 (two) times daily.        Marland Kitchen ezetimibe (ZETIA) 10 MG tablet Take 1 tablet (10 mg total) by mouth daily.  30 tablet  3  . glucose blood test strip 1 each by Other route 2 (two) times daily. Use as instructed       . isosorbide mononitrate (IMDUR) 30 MG 24 hr tablet Take 30 mg by mouth daily.      Marland Kitchen ketoconazole (NIZORAL) 2 % cream       . Lancets MISC by Does not apply route 2 (two) times daily.        Marland Kitchen lisinopril-hydrochlorothiazide (PRINZIDE,ZESTORETIC) 20-25 MG per tablet Take 1 tablet by mouth daily.  90 tablet  3  . LORazepam (ATIVAN) 0.5 MG tablet TAKE ONE TABLET EVERY 12 HOURS  60 tablet  0  . metFORMIN (GLUCOPHAGE) 500 MG tablet Take 2 tablets (1,000 mg total) by mouth daily with breakfast.  90 tablet  3  . metoprolol (LOPRESSOR) 50 MG tablet Take 1 tablet (50 mg total) by mouth 2 (two) times daily.  180 tablet  3  . nitroGLYCERIN (NITROSTAT) 0.4 MG SL tablet Place 0.4 mg under the tongue every 5 (five) minutes as needed.        . NON FORMULARY Take 1,500 mg by mouth every other day. Lipozene      . pantoprazole (PROTONIX) 40 MG tablet Take 1 tablet (40 mg total) by mouth daily.  30 tablet  5  . pioglitazone (ACTOS) 30 MG tablet Take 1 tablet (30 mg total) by mouth daily.  90 tablet  3  . polyethylene glycol powder (CLEARLAX) powder Take 17 g by mouth as needed.      . pravastatin (PRAVACHOL) 40 MG tablet Take 1 tablet (40 mg total) by mouth at bedtime.  90 tablet  3  . ranitidine (ZANTAC) 150 MG tablet Take 1 tablet (150 mg  total) by mouth 2 (two) times daily.  60 tablet  5  . sertraline (ZOLOFT) 50  MG tablet Take 1 tablet (50 mg total) by mouth daily.  90 tablet  3  . Alcohol Swabs 70 % PADS by Does not apply route 2 (two) times daily.        Marland Kitchen HYDROcodone-acetaminophen (LORCET) 5-325 MG per tablet Take 1 tablet by mouth every 6 (six) hours as needed for pain.  90 tablet  0  . meclizine (ANTIVERT) 25 MG tablet Take 1 tablet (25 mg total) by mouth 3 (three) times daily as needed.  30 tablet  0  . ondansetron (ZOFRAN) 8 MG tablet TAKE 1 TABLET EVERY 8 HOURS AS NEEDED FOR NAUSEA  20 tablet  0   No current facility-administered medications on file prior to visit.   Allergies  Allergen Reactions  . Promethazine Hcl Anaphylaxis  . Penicillins   . Promethazine Hcl Other (See Comments)   Immunization History  Administered Date(s) Administered  . Influenza,inj,Quad PF,36+ Mos 01/19/2013  . Pneumococcal Conjugate-13 08/13/2013  . Tdap 12/18/2012   Prior to Admission medications   Medication Sig Start Date End Date Taking? Authorizing Provider  amLODipine (NORVASC) 5 MG tablet Take 1 tablet (5 mg total) by mouth daily. 10/28/12  Yes Vernie Shanks, MD  aspirin 81 MG tablet Take 81 mg by mouth daily.   Yes Historical Provider, MD  Blood Glucose Monitoring Suppl (GLUCOCOM MONITOR) W/DEVICE KIT by Does not apply route 2 (two) times daily.     Yes Historical Provider, MD  ezetimibe (ZETIA) 10 MG tablet Take 1 tablet (10 mg total) by mouth daily. 10/28/12  Yes Vernie Shanks, MD  glucose blood test strip 1 each by Other route 2 (two) times daily. Use as instructed    Yes Historical Provider, MD  isosorbide mononitrate (IMDUR) 30 MG 24 hr tablet Take 30 mg by mouth daily.   Yes Historical Provider, MD  ketoconazole (NIZORAL) 2 % cream  10/28/12  Yes Historical Provider, MD  Lancets MISC by Does not apply route 2 (two) times daily.     Yes Historical Provider, MD  lisinopril-hydrochlorothiazide (PRINZIDE,ZESTORETIC) 20-25 MG  per tablet Take 1 tablet by mouth daily. 10/28/12  Yes Vernie Shanks, MD  LORazepam (ATIVAN) 0.5 MG tablet TAKE ONE TABLET EVERY 12 HOURS   Yes Vernie Shanks, MD  metFORMIN (GLUCOPHAGE) 500 MG tablet Take 2 tablets (1,000 mg total) by mouth daily with breakfast. 10/28/12  Yes Vernie Shanks, MD  metoprolol (LOPRESSOR) 50 MG tablet Take 1 tablet (50 mg total) by mouth 2 (two) times daily. 10/28/12  Yes Vernie Shanks, MD  nitroGLYCERIN (NITROSTAT) 0.4 MG SL tablet Place 0.4 mg under the tongue every 5 (five) minutes as needed.     Yes Historical Provider, MD  NON FORMULARY Take 1,500 mg by mouth every other day. Lipozene   Yes Historical Provider, MD  pantoprazole (PROTONIX) 40 MG tablet Take 1 tablet (40 mg total) by mouth daily. 05/14/13 10/12/14 Yes Vernie Shanks, MD  pioglitazone (ACTOS) 30 MG tablet Take 1 tablet (30 mg total) by mouth daily. 10/28/12  Yes Vernie Shanks, MD  polyethylene glycol powder (CLEARLAX) powder Take 17 g by mouth as needed.   Yes Historical Provider, MD  pravastatin (PRAVACHOL) 40 MG tablet Take 1 tablet (40 mg total) by mouth at bedtime. 10/28/12  Yes Vernie Shanks, MD  ranitidine (ZANTAC) 150 MG tablet Take 1 tablet (150 mg total) by mouth 2 (two) times daily. 05/14/13  Yes Vernie Shanks, MD  sertraline (ZOLOFT) 50 MG  tablet Take 1 tablet (50 mg total) by mouth daily. 10/28/12  Yes Vernie Shanks, MD  Alcohol Swabs 70 % PADS by Does not apply route 2 (two) times daily.      Historical Provider, MD  HYDROcodone-acetaminophen (LORCET) 5-325 MG per tablet Take 1 tablet by mouth every 6 (six) hours as needed for pain. 02/03/13   Vernie Shanks, MD  meclizine (ANTIVERT) 25 MG tablet Take 1 tablet (25 mg total) by mouth 3 (three) times daily as needed. 11/27/12   Lysbeth Penner, FNP  ondansetron (ZOFRAN) 8 MG tablet TAKE 1 TABLET EVERY 8 HOURS AS NEEDED FOR NAUSEA    Vernie Shanks, MD     ROS: As above in the HPI. All other systems are stable or  negative.  OBJECTIVE: APPEARANCE:  Patient in no acute distress.The patient appeared well nourished and normally developed. Acyanotic. Waist: VITAL SIGNS:BP 133/60  Pulse 60  Temp(Src) 98.7 F (37.1 C) (Oral)  Ht $R'5\' 2"'xF$  (1.575 m)  Wt 191 lb 6.4 oz (86.818 kg)  BMI 35.00 kg/m2  Obese WF  SKIN: warm and  Dry without overt rashes, tattoos and scars  HEAD and Neck: without JVD, Head and scalp: normal Eyes:No scleral icterus. Fundi normal, eye movements normal. Ears: Auricle normal, canal normal, Tympanic membranes normal, insufflation normal. Nose: normal Throat: normal Neck & thyroid: normal  CHEST & LUNGS: Chest wall: normal Lungs: Clear  CVS: Reveals the PMI to be normally located. Regular rhythm, First and Second Heart sounds are normal,  absence of murmurs, rubs or gallops. Peripheral vasculature: Radial pulses: normal Dorsal pedis pulses: normal Posterior pulses: normal  ABDOMEN:  Appearance: Obese Benign, no organomegaly, no masses, no Abdominal Aortic enlargement. No Guarding , no rebound. No Bruits. Bowel sounds: normal  RECTAL: N/A GU: N/A  EXTREMETIES: nonedematous.  MUSCULOSKELETAL:  Spine: normal Joints: intact  NEUROLOGIC: oriented to time,place and person; nonfocal. Strength is normal Sensory is normal Reflexes are normal Cranial Nerves are normal.  ASSESSMENT: Type II or unspecified type diabetes mellitus without mention of complication, not stated as uncontrolled - Plan: POCT glycosylated hemoglobin (Hb A1C), Pneumococcal conjugate vaccine 13-valent IM  HYPERTENSION, UNSPECIFIED - Plan: CMP14+EGFR  HYPERLIPIDEMIA-MIXED - Plan: NMR, lipoprofile  CAD, NATIVE VESSEL  OSTEOARTHRITIS  Gastroesophageal reflux  Palpitations - Plan: EKG 12-Lead, Magnesium, Ambulatory referral to Cardiology, TSH  Need for vaccination with 13-polyvalent pneumococcal conjugate vaccine - Plan: Pneumococcal conjugate vaccine 13-valent IM  PLAN:  Handout on  palpitations, atrial fib, in the AVS       Dr Paula Libra Recommendations  For nutrition information, I recommend books:  1).Eat to Live by Dr Excell Seltzer. 2).Prevent and Reverse Heart Disease by Dr Karl Luke. 3) Dr Janene Harvey Book:  Program to Reverse Diabetes  Exercise recommendations are:  If unable to walk, then the patient can exercise in a chair 3 times a day. By flapping arms like a bird gently and raising legs outwards to the front.  If ambulatory, the patient can go for walks for 30 minutes 3 times a week. Then increase the intensity and duration as tolerated.  Goal is to try to attain exercise frequency to 5 times a week.  If applicable: Best to perform resistance exercises (machines or weights) 2 days a week and cardio type exercises 3 days per week.  DM footcare.  Orders Placed This Encounter  Procedures  . Pneumococcal conjugate vaccine 13-valent IM  . CMP14+EGFR  . NMR, lipoprofile  . Magnesium  .  TSH  . Ambulatory referral to Cardiology    Referral Priority:  Routine    Referral Type:  Consultation    Referral Reason:  Specialty Services Required    Requested Specialty:  Cardiology    Number of Visits Requested:  1  . POCT glycosylated hemoglobin (Hb A1C)  . EKG 12-Lead  EKG is normal . Refer to cardiology for evaluation of the recent hospitalization for atrial fibrillation.  No orders of the defined types were placed in this encounter.   There are no discontinued medications. Return in about 2 months (around 10/13/2013) for Recheck medical problems.  Delroy Ordway P. Jacelyn Grip, M.D.

## 2013-08-14 LAB — CMP14+EGFR
ALT: 15 IU/L (ref 0–32)
AST: 19 IU/L (ref 0–40)
Albumin/Globulin Ratio: 2.4 (ref 1.1–2.5)
Albumin: 4.3 g/dL (ref 3.5–4.8)
Alkaline Phosphatase: 77 IU/L (ref 39–117)
BUN/Creatinine Ratio: 16 (ref 11–26)
BUN: 14 mg/dL (ref 8–27)
CO2: 30 mmol/L — ABNORMAL HIGH (ref 18–29)
Calcium: 9.7 mg/dL (ref 8.7–10.3)
Chloride: 101 mmol/L (ref 97–108)
Creatinine, Ser: 0.88 mg/dL (ref 0.57–1.00)
GFR calc Af Amer: 76 mL/min/{1.73_m2} (ref >59)
GFR calc non Af Amer: 66 mL/min/{1.73_m2} (ref >59)
Globulin, Total: 1.8 g/dL (ref 1.5–4.5)
Glucose: 107 mg/dL — ABNORMAL HIGH (ref 65–99)
Potassium: 4.5 mmol/L (ref 3.5–5.2)
Sodium: 141 mmol/L (ref 134–144)
Total Bilirubin: 0.7 mg/dL (ref 0.0–1.2)
Total Protein: 6.1 g/dL (ref 6.0–8.5)

## 2013-08-14 LAB — NMR, LIPOPROFILE
Cholesterol: 114 mg/dL (ref <200)
HDL Cholesterol by NMR: 43 mg/dL (ref >=40)
HDL Particle Number: 34.2 umol/L (ref >=30.5)
LDL Particle Number: 776 nmol/L (ref <1000)
LDL Size: 20.9 nm (ref >20.5)
LDLC SERPL CALC-MCNC: 58 mg/dL (ref <100)
LP-IR Score: 50 — ABNORMAL HIGH (ref <=45)
Small LDL Particle Number: 400 nmol/L (ref <=527)
Triglycerides by NMR: 67 mg/dL (ref <150)

## 2013-08-14 LAB — MAGNESIUM: Magnesium: 1.8 mg/dL (ref 1.6–2.6)

## 2013-08-14 LAB — TSH: TSH: 1.78 u[IU]/mL (ref 0.450–4.500)

## 2013-08-17 ENCOUNTER — Telehealth: Payer: Self-pay | Admitting: Family Medicine

## 2013-08-17 NOTE — Telephone Encounter (Signed)
Message copied by Waverly Ferrari on Mon Aug 17, 2013  9:33 AM ------      Message from: Vernie Shanks      Created: Fri Aug 14, 2013 10:44 AM       Call Patient      Lab result at or close to goal.      No change in Medications for now.      No Change in recommendations.      No change in plans for follow up. ------

## 2013-10-14 ENCOUNTER — Ambulatory Visit (INDEPENDENT_AMBULATORY_CARE_PROVIDER_SITE_OTHER): Payer: Medicare Other | Admitting: Family

## 2013-10-14 ENCOUNTER — Encounter: Payer: Self-pay | Admitting: Family

## 2013-10-14 VITALS — BP 148/59 | HR 52 | Temp 96.7°F | Ht 62.0 in | Wt 196.0 lb

## 2013-10-14 DIAGNOSIS — M19031 Primary osteoarthritis, right wrist: Secondary | ICD-10-CM

## 2013-10-14 DIAGNOSIS — I10 Essential (primary) hypertension: Secondary | ICD-10-CM

## 2013-10-14 DIAGNOSIS — F32A Depression, unspecified: Secondary | ICD-10-CM

## 2013-10-14 DIAGNOSIS — F329 Major depressive disorder, single episode, unspecified: Secondary | ICD-10-CM

## 2013-10-14 DIAGNOSIS — E559 Vitamin D deficiency, unspecified: Secondary | ICD-10-CM

## 2013-10-14 DIAGNOSIS — K219 Gastro-esophageal reflux disease without esophagitis: Secondary | ICD-10-CM

## 2013-10-14 DIAGNOSIS — K5900 Constipation, unspecified: Secondary | ICD-10-CM

## 2013-10-14 DIAGNOSIS — M19039 Primary osteoarthritis, unspecified wrist: Secondary | ICD-10-CM

## 2013-10-14 DIAGNOSIS — F3289 Other specified depressive episodes: Secondary | ICD-10-CM

## 2013-10-14 DIAGNOSIS — I4891 Unspecified atrial fibrillation: Secondary | ICD-10-CM

## 2013-10-14 DIAGNOSIS — E785 Hyperlipidemia, unspecified: Secondary | ICD-10-CM

## 2013-10-14 DIAGNOSIS — E119 Type 2 diabetes mellitus without complications: Secondary | ICD-10-CM

## 2013-10-14 LAB — POCT GLYCOSYLATED HEMOGLOBIN (HGB A1C): Hemoglobin A1C: 6.6

## 2013-10-14 MED ORDER — HYDROCODONE-ACETAMINOPHEN 5-325 MG PO TABS: 1.0000 | ORAL_TABLET | Freq: Four times a day (QID) | ORAL | Status: DC | PRN

## 2013-10-14 MED ORDER — LISINOPRIL-HYDROCHLOROTHIAZIDE 20-12.5 MG PO TABS: 2.0000 | ORAL_TABLET | Freq: Every day | ORAL | Status: DC

## 2013-10-14 MED ORDER — EZETIMIBE 10 MG PO TABS: 10.0000 mg | ORAL_TABLET | Freq: Every day | ORAL | Status: DC

## 2013-10-14 MED ORDER — PIOGLITAZONE HCL 30 MG PO TABS: 30.0000 mg | ORAL_TABLET | Freq: Every day | ORAL | Status: DC

## 2013-10-14 MED ORDER — RANITIDINE HCL 150 MG PO TABS: 150.0000 mg | ORAL_TABLET | Freq: Two times a day (BID) | ORAL | Status: DC

## 2013-10-14 MED ORDER — SERTRALINE HCL 50 MG PO TABS: 50.0000 mg | ORAL_TABLET | Freq: Every day | ORAL | Status: DC

## 2013-10-14 MED ORDER — METOPROLOL TARTRATE 50 MG PO TABS: 50.0000 mg | ORAL_TABLET | Freq: Two times a day (BID) | ORAL | Status: DC

## 2013-10-14 MED ORDER — ISOSORBIDE MONONITRATE ER 30 MG PO TB24: 30.0000 mg | ORAL_TABLET | Freq: Every day | ORAL | Status: DC

## 2013-10-14 MED ORDER — PANTOPRAZOLE SODIUM 40 MG PO TBEC: 40.0000 mg | DELAYED_RELEASE_TABLET | Freq: Every day | ORAL | Status: DC

## 2013-10-14 MED ORDER — LORAZEPAM 0.5 MG PO TABS: ORAL_TABLET | ORAL | Status: DC

## 2013-10-14 MED ORDER — METFORMIN HCL 500 MG PO TABS: 1000.0000 mg | ORAL_TABLET | Freq: Every day | ORAL | Status: DC

## 2013-10-14 NOTE — Patient Instructions (Signed)
DASH Eating Plan DASH stands for "Dietary Approaches to Stop Hypertension." The DASH eating plan is a healthy eating plan that has been shown to reduce high blood pressure (hypertension). Additional health benefits may include reducing the risk of type 2 diabetes mellitus, heart disease, and stroke. The DASH eating plan may also help with weight loss. WHAT DO I NEED TO KNOW ABOUT THE DASH EATING PLAN? For the DASH eating plan, you will follow these general guidelines:  Choose foods with a percent daily value for sodium of less than 5% (as listed on the food label).  Use salt-free seasonings or herbs instead of table salt or sea salt.  Check with your health care provider or pharmacist before using salt substitutes.  Eat lower-sodium products, often labeled as "lower sodium" or "no salt added."  Eat fresh foods.  Eat more vegetables, fruits, and low-fat dairy products.  Choose whole grains. Look for the word "whole" as the first word in the ingredient list.  Choose fish and skinless chicken or turkey more often than red meat. Limit fish, poultry, and meat to 6 oz (170 g) each day.  Limit sweets, desserts, sugars, and sugary drinks.  Choose heart-healthy fats.  Limit cheese to 1 oz (28 g) per day.  Eat more home-cooked food and less restaurant, buffet, and fast food.  Limit fried foods.  Cook foods using methods other than frying.  Limit canned vegetables. If you do use them, rinse them well to decrease the sodium.  When eating at a restaurant, ask that your food be prepared with less salt, or no salt if possible. WHAT FOODS CAN I EAT? Seek help from a dietitian for individual calorie needs. Grains Whole grain or whole wheat bread. Brown rice. Whole grain or whole wheat pasta. Quinoa, bulgur, and whole grain cereals. Low-sodium cereals. Corn or whole wheat flour tortillas. Whole grain cornbread. Whole grain crackers. Low-sodium crackers. Vegetables Fresh or frozen vegetables  (raw, steamed, roasted, or grilled). Low-sodium or reduced-sodium tomato and vegetable juices. Low-sodium or reduced-sodium tomato sauce and paste. Low-sodium or reduced-sodium canned vegetables.  Fruits All fresh, canned (in natural juice), or frozen fruits. Meat and Other Protein Products Ground beef (85% or leaner), grass-fed beef, or beef trimmed of fat. Skinless chicken or turkey. Ground chicken or turkey. Pork trimmed of fat. All fish and seafood. Eggs. Dried beans, peas, or lentils. Unsalted nuts and seeds. Unsalted canned beans. Dairy Low-fat dairy products, such as skim or 1% milk, 2% or reduced-fat cheeses, low-fat ricotta or cottage cheese, or plain low-fat yogurt. Low-sodium or reduced-sodium cheeses. Fats and Oils Tub margarines without trans fats. Light or reduced-fat mayonnaise and salad dressings (reduced sodium). Avocado. Safflower, olive, or canola oils. Natural peanut or almond butter. Other Unsalted popcorn and pretzels. The items listed above may not be a complete list of recommended foods or beverages. Contact your dietitian for more options. WHAT FOODS ARE NOT RECOMMENDED? Grains White bread. White pasta. White rice. Refined cornbread. Bagels and croissants. Crackers that contain trans fat. Vegetables Creamed or fried vegetables. Vegetables in a cheese sauce. Regular canned vegetables. Regular canned tomato sauce and paste. Regular tomato and vegetable juices. Fruits Dried fruits. Canned fruit in light or heavy syrup. Fruit juice. Meat and Other Protein Products Fatty cuts of meat. Ribs, chicken wings, bacon, sausage, bologna, salami, chitterlings, fatback, hot dogs, bratwurst, and packaged luncheon meats. Salted nuts and seeds. Canned beans with salt. Dairy Whole or 2% milk, cream, half-and-half, and cream cheese. Whole-fat or sweetened yogurt. Full-fat   cheeses or blue cheese. Nondairy creamers and whipped toppings. Processed cheese, cheese spreads, or cheese  curds. Condiments Onion and garlic salt, seasoned salt, table salt, and sea salt. Canned and packaged gravies. Worcestershire sauce. Tartar sauce. Barbecue sauce. Teriyaki sauce. Soy sauce, including reduced sodium. Steak sauce. Fish sauce. Oyster sauce. Cocktail sauce. Horseradish. Ketchup and mustard. Meat flavorings and tenderizers. Bouillon cubes. Hot sauce. Tabasco sauce. Marinades. Taco seasonings. Relishes. Fats and Oils Butter, stick margarine, lard, shortening, ghee, and bacon fat. Coconut, palm kernel, or palm oils. Regular salad dressings. Other Pickles and olives. Salted popcorn and pretzels. The items listed above may not be a complete list of foods and beverages to avoid. Contact your dietitian for more information. WHERE CAN I FIND MORE INFORMATION? National Heart, Lung, and Blood Institute: travelstabloid.com Document Released: 03/15/2011 Document Revised: 03/31/2013 Document Reviewed: 01/28/2013 [facility] Patient Information 2015 [place], [place]. This information is not intended to replace advice given to you by your health care provider. Make sure you discuss any questions you have with your health care provider. Hypertension Hypertension, commonly called high blood pressure, is when the force of blood pumping through your arteries is too strong. Your arteries are the blood vessels that carry blood from your heart throughout your body. A blood pressure reading consists of a higher number over a lower number, such as 110/72. The higher number (systolic) is the pressure inside your arteries when your heart pumps. The lower number (diastolic) is the pressure inside your arteries when your heart relaxes. Ideally you want your blood pressure below 120/80. Hypertension forces your heart to work harder to pump blood. Your arteries may become narrow or stiff. Having hypertension puts you at risk for heart disease, stroke, and other problems.  RISK  FACTORS Some risk factors for high blood pressure are controllable. Others are not.  Risk factors you cannot control include:   Race. You may be at higher risk if you are African American.  Age. Risk increases with age.  Gender. Men are at higher risk than women before age 46 years. After age 78, women are at higher risk than men. Risk factors you can control include:  Not getting enough exercise or physical activity.  Being overweight.  Getting too much fat, sugar, calories, or salt in your diet.  Drinking too much alcohol. SIGNS AND SYMPTOMS Hypertension does not usually cause signs or symptoms. Extremely high blood pressure (hypertensive crisis) may cause headache, anxiety, shortness of breath, and nosebleed. DIAGNOSIS  To check if you have hypertension, your health care provider will measure your blood pressure while you are seated, with your arm held at the level of your heart. It should be measured at least twice using the same arm. Certain conditions can cause a difference in blood pressure between your right and left arms. A blood pressure reading that is higher than normal on one occasion does not mean that you need treatment. If one blood pressure reading is high, ask your health care provider about having it checked again. TREATMENT  Treating high blood pressure includes making lifestyle changes and possibly taking medication. Living a healthy lifestyle can help lower high blood pressure. You may need to change some of your habits. Lifestyle changes may include:  Following the DASH diet. This diet is high in fruits, vegetables, and whole grains. It is low in salt, red meat, and added sugars.  Getting at least 2 1/2 hours of brisk physical activity every week.  Losing weight if necessary.  Not smoking.  Limiting alcoholic beverages.  Learning ways to reduce stress. If lifestyle changes are not enough to get your blood pressure under control, your health care provider  may prescribe medicine. You may need to take more than one. Work closely with your health care provider to understand the risks and benefits. HOME CARE INSTRUCTIONS  Have your blood pressure rechecked as directed by your health care provider.   Only take medicine as directed by your health care provider. Follow the directions carefully. Blood pressure medicines must be taken as prescribed. The medicine does not work as well when you skip doses. Skipping doses also puts you at risk for problems.   Do not smoke.   Monitor your blood pressure at home as directed by your health care provider. SEEK MEDICAL CARE IF:   You think you are having a reaction to medicines taken.  You have recurrent headaches or feel dizzy.  You have swelling in your ankles.  You have trouble with your vision. SEEK IMMEDIATE MEDICAL CARE IF:  You develop a severe headache or confusion.  You have unusual weakness, numbness, or feel faint.  You have severe chest or abdominal pain.  You vomit repeatedly.  You have trouble breathing. MAKE SURE YOU:   Understand these instructions.  Will watch your condition.  Will get help right away if you are not doing well or get worse. Document Released: 03/26/2005 Document Revised: 03/31/2013 Document Reviewed: 01/16/2013 [facility] Patient Information 2015 [place], [place]. This information is not intended to replace advice given to you by your health care provider. Make sure you discuss any questions you have with your health care provider.

## 2013-10-14 NOTE — Progress Notes (Signed)
Subjective:    Patient ID: Rhonda Garcia, female    DOB: June 16, 1940, 73 y.o.   MRN: [id number]  Diabetes She presents for her follow-up diabetic visit. She has type 2 diabetes mellitus. Her disease course has been fluctuating. Hypoglycemia symptoms include headaches and nervousness/anxiousness. Pertinent negatives for hypoglycemia include no confusion or dizziness. Associated symptoms include foot paresthesias and visual change. Pertinent negatives for diabetes include no chest pain, no fatigue and no foot ulcerations. There are no hypoglycemic complications. Symptoms are stable. Diabetic complications include heart disease and peripheral neuropathy. Pertinent negatives for diabetic complications include no CVA. Risk factors for coronary artery disease include dyslipidemia, family history, hypertension, post-menopausal, stress and diabetes mellitus. Current diabetic treatment includes oral agent (dual therapy). She is compliant with treatment all of the time. She is following a generally unhealthy diet. She participates in exercise weekly. Her breakfast blood glucose range is generally 130-140 mg/dl. An ACE inhibitor/angiotensin II receptor blocker is being taken. Eye exam is not current.  Hypertension This is a chronic problem. The current episode started more than 1 year ago. The problem has been waxing and waning since onset. The problem is uncontrolled. Associated symptoms include anxiety, headaches and palpitations. Pertinent negatives include no chest pain, peripheral edema or shortness of breath. Risk factors for coronary artery disease include diabetes mellitus, dyslipidemia, family history, obesity and post-menopausal state. Past treatments include ACE inhibitors, diuretics and beta blockers. The current treatment provides moderate improvement. Hypertensive end-organ damage includes CAD/MI. There is no history of kidney disease, CVA or a thyroid problem. Identifiable causes of hypertension  include sleep apnea.  Hyperlipidemia This is a chronic problem. The current episode started more than 1 year ago. The problem is controlled. Recent lipid tests were reviewed and are normal. Exacerbating diseases include diabetes. She has no history of hypothyroidism. Factors aggravating her hyperlipidemia include fatty foods. Pertinent negatives include no chest pain, leg pain, myalgias or shortness of breath. Current antihyperlipidemic treatment includes statins and fibric acid derivatives. The current treatment provides significant improvement of lipids. Risk factors for coronary artery disease include diabetes mellitus, dyslipidemia, family history, hypertension and post-menopausal.  Gastrophageal Reflux She reports no belching, no chest pain, no heartburn or no sore throat. This is a chronic problem. The current episode started more than 1 year ago. The problem occurs rarely. The problem has been resolved. The symptoms are aggravated by certain foods. Pertinent negatives include no fatigue or melena. She has tried a PPI for the symptoms. The treatment provided significant relief.  Anxiety Presents for follow-up visit. Symptoms include insomnia, nervous/anxious behavior and palpitations. Patient reports no chest pain, confusion, dizziness or shortness of breath. Symptoms occur occasionally. The severity of symptoms is moderate.   Her past medical history is significant for anxiety/panic attacks. Past treatments include SSRIs and benzodiazephines. The treatment provided moderate relief. Compliance with prior treatments has been good.  *Pt went to ED on 08/13/13 ago for A Fib. PT has appointment with Cardiologist on 10/21/13 with Dr. Percival Spanish.    Review of Systems  Constitutional: Negative.  Negative for fatigue.  HENT: Negative for sore throat.   Eyes: Negative.   Respiratory: Negative.  Negative for shortness of breath.   Cardiovascular: Positive for palpitations. Negative for chest pain.    Gastrointestinal: Positive for constipation. Negative for heartburn and melena.  Endocrine: Negative.   Genitourinary: Negative.   Musculoskeletal: Negative.  Negative for myalgias.  Neurological: Positive for headaches. Negative for dizziness.  Hematological: Negative.   Psychiatric/Behavioral: Negative  for confusion. The patient is nervous/anxious and has insomnia.   All other systems reviewed and are negative.      Objective:   Physical Exam  Vitals reviewed. Constitutional: She is oriented to person, place, and time. She appears well-developed and well-nourished. No distress.  HENT:  Head: Normocephalic and atraumatic.  Right Ear: External ear normal.  Mouth/Throat: Oropharynx is clear and moist.  Eyes: Pupils are equal, round, and reactive to light.  Neck: Normal range of motion. Neck supple. No thyromegaly present.  Cardiovascular: Normal rate, regular rhythm, normal heart sounds and intact distal pulses.   No murmur heard. Pulmonary/Chest: Effort normal and breath sounds normal. No respiratory distress. She has no wheezes.  Abdominal: Soft. Bowel sounds are normal. She exhibits no distension. There is no tenderness.  Musculoskeletal: Normal range of motion. She exhibits no edema and no tenderness.  Neurological: She is alert and oriented to person, place, and time. She has normal reflexes. No cranial nerve deficit.  Skin: Skin is warm and dry.  Psychiatric: She has a normal mood and affect. Her behavior is normal. Judgment and thought content normal.      BP 148/59  Pulse 52  Temp(Src) 96.7 F (35.9 C) (Oral)  Ht 5' 2"  (1.575 m)  Wt 196 lb (88.905 kg)  BMI 35.84 kg/m2     Assessment & Plan:  1. HYPERTENSION, UNSPECIFIED -Dash diet information given -Exercise encouraged - Stress Management  -Continue current meds -RTO in 2 weeks for nurse to check B/P - CMP14+EGFR - metoprolol (LOPRESSOR) 50 MG tablet; Take 1 tablet (50 mg total) by mouth 2 (two) times daily.   Dispense: 180 tablet; Refill: 3 - lisinopril-hydrochlorothiazide (PRINZIDE,ZESTORETIC) 20-12.5 MG per tablet; Take 2 tablets by mouth daily.  Dispense: 180 tablet; Refill: 6  2. Gastroesophageal reflux disease without esophagitis - pantoprazole (PROTONIX) 40 MG tablet; Take 1 tablet (40 mg total) by mouth daily.  Dispense: 30 tablet; Refill: 5 - ranitidine (ZANTAC) 150 MG tablet; Take 1 tablet (150 mg total) by mouth 2 (two) times daily.  Dispense: 180 tablet; Refill: 3  3. Type II or unspecified type diabetes mellitus without mention of complication, not stated as uncontrolled - pioglitazone (ACTOS) 30 MG tablet; Take 1 tablet (30 mg total) by mouth daily.  Dispense: 90 tablet; Refill: 3 - metFORMIN (GLUCOPHAGE) 500 MG tablet; Take 2 tablets (1,000 mg total) by mouth daily with breakfast.  Dispense: 180 tablet; Refill: 3 - POCT glycosylated hemoglobin (Hb A1C)  4. HYPERLIPIDEMIA-MIXED - Lipid panel - ezetimibe (ZETIA) 10 MG tablet; Take 1 tablet (10 mg total) by mouth daily.  Dispense: 90 tablet; Refill: 3  5. Depression - LORazepam (ATIVAN) 0.5 MG tablet; TAKE ONE TABLET EVERY 12 HOURS  Dispense: 60 tablet; Refill: 0 - sertraline (ZOLOFT) 50 MG tablet; Take 1 tablet (50 mg total) by mouth daily.  Dispense: 90 tablet; Refill: 3  6. Atrial fibrillation, unspecified  7. Constipation, unspecified constipation type  8. Vitamin D deficiency disease - Vit D  25 hydroxy (rtn osteoporosis monitoring)  9. Osteoarthritis of right wrist, unspecified osteoarthritis type - HYDROcodone-acetaminophen (LORCET) 5-325 MG per tablet; Take 1 tablet by mouth every 6 (six) hours as needed.  Dispense: 90 tablet; Refill: 0   Continue all meds Labs pending Health Maintenance reviewed Diet and exercise encouraged RTO 3 months  Evelina Dun, FNP

## 2013-10-15 ENCOUNTER — Telehealth: Payer: Self-pay | Admitting: Family Medicine

## 2013-10-15 LAB — CMP14+EGFR
ALT: 12 IU/L (ref 0–32)
AST: 18 IU/L (ref 0–40)
Albumin/Globulin Ratio: 2.1 (ref 1.1–2.5)
Albumin: 4.1 g/dL (ref 3.5–4.8)
Alkaline Phosphatase: 78 IU/L (ref 39–117)
BUN/Creatinine Ratio: 15 (ref 11–26)
BUN: 11 mg/dL (ref 8–27)
CO2: 25 mmol/L (ref 18–29)
Calcium: 9.7 mg/dL (ref 8.7–10.3)
Chloride: 101 mmol/L (ref 97–108)
Creatinine, Ser: 0.73 mg/dL (ref 0.57–1.00)
GFR calc Af Amer: 95 mL/min/{1.73_m2} (ref >59)
GFR calc non Af Amer: 83 mL/min/{1.73_m2} (ref >59)
Globulin, Total: 2 g/dL (ref 1.5–4.5)
Glucose: 111 mg/dL — ABNORMAL HIGH (ref 65–99)
Potassium: 4.6 mmol/L (ref 3.5–5.2)
Sodium: 144 mmol/L (ref 134–144)
Total Bilirubin: 0.5 mg/dL (ref 0.0–1.2)
Total Protein: 6.1 g/dL (ref 6.0–8.5)

## 2013-10-15 LAB — LIPID PANEL
Chol/HDL Ratio: 3.1 ratio units (ref 0.0–4.4)
Cholesterol, Total: 151 mg/dL (ref 100–199)
HDL: 48 mg/dL (ref >39)
LDL Calculated: 86 mg/dL (ref 0–99)
Triglycerides: 84 mg/dL (ref 0–149)
VLDL Cholesterol Cal: 17 mg/dL (ref 5–40)

## 2013-10-15 LAB — VITAMIN D 25 HYDROXY (VIT D DEFICIENCY, FRACTURES): Vit D, 25-Hydroxy: 26.4 ng/mL — ABNORMAL LOW (ref 30.0–100.0)

## 2013-10-15 NOTE — Telephone Encounter (Signed)
Message copied by Waverly Ferrari on Thu Oct 15, 2013  9:45 AM ------      Message from: Lenna Gilford, [place] A      Created: Thu Oct 15, 2013  9:15 AM       Hgb A1c WNL      Kidney and liver function stable      Cholesterol levels WNL      Vit D levels low- Need to start on Vit D OCT ------

## 2013-10-15 NOTE — Telephone Encounter (Signed)
Patient aware.

## 2013-10-21 ENCOUNTER — Encounter: Payer: Self-pay | Admitting: *Deleted

## 2013-10-21 ENCOUNTER — Encounter: Payer: Self-pay | Admitting: Cardiology

## 2013-10-21 ENCOUNTER — Telehealth: Payer: Self-pay | Admitting: Cardiology

## 2013-10-21 ENCOUNTER — Ambulatory Visit (INDEPENDENT_AMBULATORY_CARE_PROVIDER_SITE_OTHER): Payer: Medicare Other | Admitting: Cardiology

## 2013-10-21 VITALS — BP 138/82 | HR 63 | Ht 62.0 in | Wt 189.0 lb

## 2013-10-21 DIAGNOSIS — I480 Paroxysmal atrial fibrillation: Secondary | ICD-10-CM

## 2013-10-21 DIAGNOSIS — I4891 Unspecified atrial fibrillation: Secondary | ICD-10-CM

## 2013-10-21 DIAGNOSIS — I10 Essential (primary) hypertension: Secondary | ICD-10-CM

## 2013-10-21 DIAGNOSIS — I2510 Atherosclerotic heart disease of native coronary artery without angina pectoris: Secondary | ICD-10-CM

## 2013-10-21 MED ORDER — APIXABAN 5 MG PO TABS: 5.0000 mg | ORAL_TABLET | Freq: Two times a day (BID) | ORAL | Status: DC

## 2013-10-21 NOTE — Telephone Encounter (Signed)
Left message for pt to call back to discuss a different medication. Per Dr Percival Spanish she can take either Xarelto or coumadin - I was able to locate a $10 co-pay card for Eliquis however.

## 2013-10-21 NOTE — Telephone Encounter (Signed)
Spoke with pt - she will come pick up the co-pay card for Eliquis

## 2013-10-21 NOTE — Progress Notes (Signed)
HPI The patient presents for evaluation of known coronary disease. She has a history of stenting as described below. Her last catheterization was 2010. Stress perfusion study in 2012 did not suggest any high-risk findings.  She was sent back for follow up of atrial fibrillation.  She has had a history of paroxysmal fibrillation and was recently hospitalized at [facility] for an episode. She reports this lasted about 15 minutes and came on with emotional stress caused by her sons arguing. She also recently lost her husband and has been under stress because of this.  She hasn't had any episodes since then. She felt her heart racing. She felt somewhat lightheaded. She did not have syncope however. She has not had any chest pressure, neck or arm discomfort. She does her chores and also some exercising. She might have some mild dyspnea but no PND or orthopnea. She's had no weight gain or edema and has actually lost weight.  I was able to review the records from her hospitalization. She had 2-D echo demonstrated well-preserved ejection fraction area in the cardiac enzymes.  Allergies  Allergen Reactions  . Promethazine Hcl Anaphylaxis  . Penicillins   . Promethazine Hcl Other (See Comments)    Current Outpatient Prescriptions  Medication Sig Dispense Refill  . amLODipine (NORVASC) 5 MG tablet Take 1 tablet (5 mg total) by mouth daily.  90 tablet  3  . aspirin 81 MG tablet Take 81 mg by mouth daily.      Marland Kitchen ezetimibe (ZETIA) 10 MG tablet Take 1 tablet (10 mg total) by mouth daily.  90 tablet  3  . HYDROcodone-acetaminophen (LORCET) 5-325 MG per tablet Take 1 tablet by mouth every 6 (six) hours as needed.  90 tablet  0  . isosorbide mononitrate (IMDUR) 30 MG 24 hr tablet Take 1 tablet (30 mg total) by mouth daily.  90 tablet  3  . ketoconazole (NIZORAL) 2 % cream       . lisinopril-hydrochlorothiazide (PRINZIDE,ZESTORETIC) 20-12.5 MG per tablet Take 2 tablets by mouth daily.  180 tablet  6  . LORazepam  (ATIVAN) 0.5 MG tablet TAKE ONE TABLET EVERY 12 HOURS  60 tablet  0  . meclizine (ANTIVERT) 25 MG tablet Take 1 tablet (25 mg total) by mouth 3 (three) times daily as needed.  30 tablet  0  . metFORMIN (GLUCOPHAGE) 500 MG tablet Take 2 tablets (1,000 mg total) by mouth daily with breakfast.  180 tablet  3  . metoprolol (LOPRESSOR) 50 MG tablet Take 1 tablet (50 mg total) by mouth 2 (two) times daily.  180 tablet  3  . nitroGLYCERIN (NITROSTAT) 0.4 MG SL tablet Place 0.4 mg under the tongue every 5 (five) minutes as needed.        . NON FORMULARY Take 1,500 mg by mouth every other day. Lipozene      . pantoprazole (PROTONIX) 40 MG tablet Take 1 tablet (40 mg total) by mouth daily.  30 tablet  5  . pioglitazone (ACTOS) 30 MG tablet Take 1 tablet (30 mg total) by mouth daily.  90 tablet  3  . polyethylene glycol powder (CLEARLAX) powder Take 17 g by mouth as needed.      . pravastatin (PRAVACHOL) 40 MG tablet Take 1 tablet (40 mg total) by mouth at bedtime.  90 tablet  3  . ranitidine (ZANTAC) 150 MG tablet Take 1 tablet (150 mg total) by mouth 2 (two) times daily.  180 tablet  3  . sertraline (ZOLOFT)  50 MG tablet Take 1 tablet (50 mg total) by mouth daily.  90 tablet  3  . Alcohol Swabs 70 % PADS by Does not apply route 2 (two) times daily.        . Blood Glucose Monitoring Suppl (GLUCOCOM MONITOR) W/DEVICE KIT by Does not apply route 2 (two) times daily.        Marland Kitchen glucose blood test strip 1 each by Other route 2 (two) times daily. Use as instructed       . Lancets MISC by Does not apply route 2 (two) times daily.        . ondansetron (ZOFRAN) 8 MG tablet TAKE 1 TABLET EVERY 8 HOURS AS NEEDED FOR NAUSEA  20 tablet  0   No current facility-administered medications for this visit.    Past Medical History  Diagnosis Date  . Diabetes mellitus   . CAD (coronary artery disease)   . MI (myocardial infarction)   . GERD (gastroesophageal reflux disease)   . Osteopenia   . Hypertension   .  Hyperlipidemia   . Constipation   . Anal fissure     resolved   . Chronic pain   . Osteoarthritis   . DDD (degenerative disc disease)   . Anemia   . Airway compromise     Inability to tolerate continuous positive airway pressure  . Hiatal hernia   . Dyslipidemia   . Sleep apnea   . Osteoporosis   . Thyroid disease   . Hearing loss   . Generalized headaches   . Rectal pain   . Change in voice   . Sebaceous cyst     on back  . Back pain     with left radiculopathy  . H/O sleep apnea   . Hx of myocardial infarction   . Depression   . Hx of peptic ulcer   . A-fib     Past Surgical History  Procedure Laterality Date  . Left knee repair    . Appendectomy    . Complete hysterectomy    . Breast reduction surgery    . Esophagogastroduodenoscopy  06/2007    Small hiatal hernia  . Colonoscopy  06/2007    Friable anal canal and anal papillae. Pancolonic diverticula. Normal terminal ileum. Status post segmental biopsy, descending colon biopsy showed minimal cryptitis. Biopsies felt to be  nonspecific but could be seen with NSAIDs. No features of inflammatory bowel disease. Stool studies were negative.  . Colonoscopy  03/21/2011    Procedure: COLONOSCOPY;  Surgeon: Daneil Dolin, MD;  Location: [facility];  Service: Endoscopy;  Laterality: N/A;  12:30  . Cholecystectomy  2008 - approximate  . Abdominal hysterectomy  2001 - approximate  . Lipoma excision  03/17/2012    Procedure: EXCISION LIPOMA;  Surgeon: Gwenyth Ober, MD;  Location: [facility];  Service: General;  Laterality: Right;  excision of right lower back lipoma  . Coronary angioplasty with stent placement    . Knee surgery      left knee repair    ROS:  As stated in the HPI and negative for all other systems.  PHYSICAL EXAM BP 138/82  Pulse 63  Ht 5' 2"  (1.575 m)  Wt 189 lb (85.73 kg)  BMI 34.56 kg/m2 GENERAL:  Well appearing HEENT:  Pupils equal round and reactive, fundi not visualized, oral  mucosa unremarkable, dentures NECK:  No jugular venous distention, waveform within normal limits, carotid upstroke brisk and symmetric, no  bruits, no thyromegaly LYMPHATICS:  No cervical, inguinal adenopathy LUNGS:  Clear to auscultation bilaterally BACK:  No CVA tenderness CHEST:  Unremarkable HEART:  PMI not displaced or sustained,S1 and S2 within normal limits, no S3, no S4, no clicks, no rubs, no murmurs ABD:  Flat, positive bowel sounds normal in frequency in pitch, no bruits, no rebound, no guarding, no midline pulsatile mass, no hepatomegaly, no splenomegaly EXT:  2 plus pulses throughout, no edema, no cyanosis no clubbing SKIN:  No rashes no nodules NEURO:  Cranial nerves II through XII grossly intact, motor grossly intact throughout PSYCH:  Cognitively intact, oriented to person place and time  EKG:  Sinus rhythm, rate 56 axis within normal limits intervals within normal limits, no acute ST-T wave changes.  10/21/2013   ASSESSMENT AND PLAN  ATRIAL FIB Ms. Seham [place] [facility] has a CHA2DS2 - VASc score of 5 with a risk of stroke of 6.7%  and a HAS - BLED score of 1 with a moderate risk of bleeding.  Anticoagulation is indicated.  I discussed the risk and benefits.  She will start on Eliquis.    She can stop her ASA.    CAD, NATIVE VESSEL  The patient's symptoms are improved. He seems to be tolerating her medication.  Although she doesn't mention any chest pain with her atrial fibrillation was mentioned in the hospital records. I will likely bring her back for a POET (Plain Old Exercise Treadmill) In the future. Of note her enzymes were negative during her hospitalization.  HYPERTENSION, UNSPECIFIED Her blood pressure is elevated today but she learned of the sudden death of friend. She thinks otherwise is well controlled. I will make no change her regimen.  HYPERLIPIDEMIA I did not see a recent lipid level with her hospitalization. I will defer to her primary provider.  DM This again  is followed by Dr. Melina Copa. I don't see a recent A1c in the further management.

## 2013-10-21 NOTE — Patient Instructions (Signed)
Please stop your ASA. Start Eliquis 5 mg one tablet twice a day. Continue all other medications as listed.  Follow up with Dr Percival Spanish in [place] in 2 months.

## 2013-10-21 NOTE — Telephone Encounter (Signed)
New message     Pt saw Dr Percival Spanish today. She cannot afford eliquis.  Can she be put on plavix?  She is going out of town tomorrow and would like an answer today if possible.

## 2013-10-21 NOTE — Telephone Encounter (Signed)
Pt came by the office to pick up the co-pay card.  She attempted to activate it however was told with her insurance she can not.  She discussed with Dr Percival Spanish her different options.  She decided she will wait to make a decision after checking out the cost of them and call back.  She understands she is putting her self at a greater risk for CVA/thrombic effect.

## 2013-10-28 ENCOUNTER — Telehealth: Payer: Self-pay | Admitting: Cardiology

## 2013-10-28 ENCOUNTER — Ambulatory Visit (INDEPENDENT_AMBULATORY_CARE_PROVIDER_SITE_OTHER): Payer: Medicare Other | Admitting: *Deleted

## 2013-10-28 VITALS — BP 130/78 | HR 66

## 2013-10-28 DIAGNOSIS — I10 Essential (primary) hypertension: Secondary | ICD-10-CM

## 2013-10-28 NOTE — Progress Notes (Signed)
Patient came in today for a 2 week nurse rck on BP. Patients BP 130/78  Pulse 66. Patient aware that we will send BP reading over to [facility] and have her review and if any changes needed to be made we would contact her.

## 2013-10-28 NOTE — Telephone Encounter (Signed)
New message    Patient calling stating she can not afford her medication - $ 95.00  Only medication she can afford is warfarin. Looking for alternative.

## 2013-10-29 MED ORDER — WARFARIN SODIUM 5 MG PO TABS: 5.0000 mg | ORAL_TABLET | Freq: Every day | ORAL | Status: DC

## 2013-10-29 NOTE — Telephone Encounter (Signed)
Pt can not afford Xarelto or Eliquis.  Will need to be started on warfarin 5 mg a day and have INR checked Monday at [facility]  appt scheduled there for 11:10 am.  Pt is aware.

## 2013-11-02 ENCOUNTER — Ambulatory Visit (INDEPENDENT_AMBULATORY_CARE_PROVIDER_SITE_OTHER): Payer: Medicare Other | Admitting: Pharmacist

## 2013-11-02 DIAGNOSIS — I4891 Unspecified atrial fibrillation: Secondary | ICD-10-CM

## 2013-11-02 DIAGNOSIS — Z7901 Long term (current) use of anticoagulants: Secondary | ICD-10-CM | POA: Insufficient documentation

## 2013-11-02 DIAGNOSIS — I480 Paroxysmal atrial fibrillation: Secondary | ICD-10-CM

## 2013-11-02 LAB — POCT INR: INR: 1.5

## 2013-11-02 NOTE — Patient Instructions (Signed)
Anticoagulation Dose Instructions as of 11/02/2013     Rhonda Garcia Tue Wed Thu Fri Sat   New Dose 5 mg 5 mg 5 mg 5 mg 5 mg 5 mg 5 mg    Description       Continue warfarin 5mg  1 tablet daily      INR was 1.5 toda (goal is 20.-3.0)

## 2013-11-05 ENCOUNTER — Ambulatory Visit (INDEPENDENT_AMBULATORY_CARE_PROVIDER_SITE_OTHER): Payer: Medicare Other | Admitting: Pharmacist

## 2013-11-05 ENCOUNTER — Other Ambulatory Visit (INDEPENDENT_AMBULATORY_CARE_PROVIDER_SITE_OTHER): Payer: Medicare Other

## 2013-11-05 DIAGNOSIS — I480 Paroxysmal atrial fibrillation: Secondary | ICD-10-CM

## 2013-11-05 DIAGNOSIS — I4891 Unspecified atrial fibrillation: Secondary | ICD-10-CM

## 2013-11-05 DIAGNOSIS — Z7901 Long term (current) use of anticoagulants: Secondary | ICD-10-CM

## 2013-11-05 LAB — POCT INR: INR: 1.9

## 2013-11-05 NOTE — Patient Instructions (Signed)
Anticoagulation Dose Instructions as of 11/05/2013     Rhonda Garcia Tue Wed Thu Fri Sat   New Dose 2.5 mg 5 mg 5 mg 2.5 mg 5 mg 5 mg 5 mg    Description       Change warfarin to 1/2 tablet on sundays and wednesdays and 1 tablet all other days.       INR was 1.9 (increased from 1.5 3 days ago)

## 2013-11-10 ENCOUNTER — Telehealth: Payer: Self-pay | Admitting: Pharmacist

## 2013-11-11 NOTE — Telephone Encounter (Signed)
I called and the soonest appt is Tuesday 11/17/13 bacuase I will be on vacation Monday 8/10.  Patient has gotten someone to stay with her son and she will come on Friday 11/13/13 as planned.

## 2013-11-13 ENCOUNTER — Ambulatory Visit (INDEPENDENT_AMBULATORY_CARE_PROVIDER_SITE_OTHER): Payer: Medicare Other | Admitting: Pharmacist

## 2013-11-13 DIAGNOSIS — I480 Paroxysmal atrial fibrillation: Secondary | ICD-10-CM

## 2013-11-13 DIAGNOSIS — I4891 Unspecified atrial fibrillation: Secondary | ICD-10-CM

## 2013-11-13 DIAGNOSIS — Z7901 Long term (current) use of anticoagulants: Secondary | ICD-10-CM

## 2013-11-13 LAB — POCT INR: INR: 2

## 2013-11-13 NOTE — Patient Instructions (Signed)
Anticoagulation Dose Instructions as of 11/13/2013     Rhonda Garcia Tue Wed Thu Fri Sat   New Dose 2.5 mg 5 mg 5 mg 2.5 mg 5 mg 5 mg 5 mg    Description       Continue warfarin 5mg  1/2 tablet on sundays and wednesdays and 1 tablet all other days.       INR was 2.0 today

## 2013-11-27 ENCOUNTER — Ambulatory Visit (INDEPENDENT_AMBULATORY_CARE_PROVIDER_SITE_OTHER): Payer: Medicare Other | Admitting: Pharmacist

## 2013-11-27 DIAGNOSIS — I4891 Unspecified atrial fibrillation: Secondary | ICD-10-CM

## 2013-11-27 DIAGNOSIS — Z7901 Long term (current) use of anticoagulants: Secondary | ICD-10-CM

## 2013-11-27 DIAGNOSIS — I480 Paroxysmal atrial fibrillation: Secondary | ICD-10-CM

## 2013-11-27 LAB — POCT INR: INR: 2.2

## 2013-11-27 NOTE — Patient Instructions (Signed)
Anticoagulation Dose Instructions as of 11/27/2013     Rhonda Garcia Tue Wed Thu Fri Sat   New Dose 2.5 mg 5 mg 5 mg 2.5 mg 5 mg 5 mg 5 mg    Description       Continue warfarin 5mg  1/2 tablet on sundays and wednesdays and 1 tablet all other days.       INR was 2.2 today

## 2013-12-14 ENCOUNTER — Other Ambulatory Visit: Payer: Self-pay | Admitting: *Deleted

## 2013-12-14 DIAGNOSIS — F32A Depression, unspecified: Secondary | ICD-10-CM

## 2013-12-14 DIAGNOSIS — F329 Major depressive disorder, single episode, unspecified: Secondary | ICD-10-CM

## 2013-12-14 NOTE — Telephone Encounter (Signed)
Last seen and filled 10/14/13. Route to pool A, call into Drug store

## 2013-12-15 MED ORDER — LORAZEPAM 0.5 MG PO TABS: ORAL_TABLET | ORAL | Status: DC

## 2013-12-15 NOTE — Telephone Encounter (Signed)
rx called into pharmacy

## 2013-12-23 ENCOUNTER — Encounter: Payer: Self-pay | Admitting: Cardiology

## 2013-12-23 ENCOUNTER — Ambulatory Visit (INDEPENDENT_AMBULATORY_CARE_PROVIDER_SITE_OTHER): Payer: Medicare Other | Admitting: Cardiology

## 2013-12-23 ENCOUNTER — Ambulatory Visit (INDEPENDENT_AMBULATORY_CARE_PROVIDER_SITE_OTHER): Payer: Medicare Other | Admitting: Pharmacist

## 2013-12-23 VITALS — BP 165/74 | HR 63 | Ht 62.0 in | Wt 197.0 lb

## 2013-12-23 DIAGNOSIS — I480 Paroxysmal atrial fibrillation: Secondary | ICD-10-CM

## 2013-12-23 DIAGNOSIS — Z7901 Long term (current) use of anticoagulants: Secondary | ICD-10-CM

## 2013-12-23 DIAGNOSIS — I4891 Unspecified atrial fibrillation: Secondary | ICD-10-CM

## 2013-12-23 DIAGNOSIS — I2510 Atherosclerotic heart disease of native coronary artery without angina pectoris: Secondary | ICD-10-CM

## 2013-12-23 LAB — POCT INR: INR: 1.5

## 2013-12-23 NOTE — Progress Notes (Signed)
HPI The patient presents for evaluation of known coronary disease and atrial fibrillation. She has a history of stenting as described below. Her last catheterization was 2010. Stress perfusion study in 2012 did not suggest any high-risk findings. She has had a history of paroxysmal fibrillation and was recently hospitalized at [facility] for an episode. She hasn't had any episodes since then that have been documented. I did start her on blood thinners previously. She's had no new cardiovascular symptoms. She has not had any chest pressure, neck or arm discomfort. She does her chores. She might have some mild dyspnea but no PND or orthopnea. She's had no weight gain or edema and has actually lost weight.  Allergies  Allergen Reactions  . Promethazine Hcl Anaphylaxis  . Penicillins   . Promethazine Hcl Other (See Comments)    Current Outpatient Prescriptions  Medication Sig Dispense Refill  . Alcohol Swabs 70 % PADS by Does not apply route 2 (two) times daily.        Marland Kitchen amLODipine (NORVASC) 5 MG tablet Take 1 tablet (5 mg total) by mouth daily.  90 tablet  3  . ezetimibe (ZETIA) 10 MG tablet Take 1 tablet (10 mg total) by mouth daily.  90 tablet  3  . HYDROcodone-acetaminophen (LORCET) 5-325 MG per tablet Take 1 tablet by mouth every 6 (six) hours as needed.  90 tablet  0  . isosorbide mononitrate (IMDUR) 30 MG 24 hr tablet Take 1 tablet (30 mg total) by mouth daily.  90 tablet  3  . lisinopril-hydrochlorothiazide (PRINZIDE,ZESTORETIC) 20-12.5 MG per tablet Take 2 tablets by mouth daily.  180 tablet  6  . LORazepam (ATIVAN) 0.5 MG tablet TAKE ONE TABLET EVERY 12 HOURS  60 tablet  0  . meclizine (ANTIVERT) 25 MG tablet Take 1 tablet (25 mg total) by mouth 3 (three) times daily as needed.  30 tablet  0  . metFORMIN (GLUCOPHAGE) 500 MG tablet Take 2 tablets (1,000 mg total) by mouth daily with breakfast.  180 tablet  3  . metoprolol (LOPRESSOR) 50 MG tablet Take 1 tablet (50 mg total) by mouth 2  (two) times daily.  180 tablet  3  . nitroGLYCERIN (NITROSTAT) 0.4 MG SL tablet Place 0.4 mg under the tongue every 5 (five) minutes as needed.        . NON FORMULARY Take 1,500 mg by mouth every other day. Lipozene      . pantoprazole (PROTONIX) 40 MG tablet Take 1 tablet (40 mg total) by mouth daily.  30 tablet  5  . pioglitazone (ACTOS) 30 MG tablet Take 1 tablet (30 mg total) by mouth daily.  90 tablet  3  . polyethylene glycol powder (CLEARLAX) powder Take 17 g by mouth as needed.      . pravastatin (PRAVACHOL) 40 MG tablet Take 1 tablet (40 mg total) by mouth at bedtime.  90 tablet  3  . ranitidine (ZANTAC) 150 MG tablet Take 1 tablet (150 mg total) by mouth 2 (two) times daily.  180 tablet  3  . sertraline (ZOLOFT) 50 MG tablet Take 1 tablet (50 mg total) by mouth daily.  90 tablet  3  . warfarin (COUMADIN) 5 MG tablet Take 1 tablet (5 mg total) by mouth daily.  30 tablet  1  . Blood Glucose Monitoring Suppl (GLUCOCOM MONITOR) W/DEVICE KIT by Does not apply route 2 (two) times daily.        Marland Kitchen glucose blood test strip 1 each by Other  route 2 (two) times daily. Use as instructed       . ketoconazole (NIZORAL) 2 % cream       . Lancets MISC by Does not apply route 2 (two) times daily.        . ondansetron (ZOFRAN) 8 MG tablet TAKE 1 TABLET EVERY 8 HOURS AS NEEDED FOR NAUSEA  20 tablet  0   No current facility-administered medications for this visit.    Past Medical History  Diagnosis Date  . Diabetes mellitus   . CAD (coronary artery disease)   . MI (myocardial infarction)   . GERD (gastroesophageal reflux disease)   . Osteopenia   . Hypertension   . Hyperlipidemia   . Anal fissure     resolved   . Chronic pain   . Osteoarthritis   . DDD (degenerative disc disease)   . Anemia   . Airway compromise     Inability to tolerate continuous positive airway pressure  . Hiatal hernia   . Dyslipidemia   . Sleep apnea   . Osteoporosis   . Thyroid disease   . Hearing loss   .  Generalized headaches   . Sebaceous cyst     on back  . Back pain     with left radiculopathy  . Hx of myocardial infarction   . Depression   . Hx of peptic ulcer   . A-fib     Past Surgical History  Procedure Laterality Date  . Knee surgery    . Appendectomy    . Complete hysterectomy    . Breast reduction surgery    . Esophagogastroduodenoscopy  06/2007    Small hiatal hernia  . Colonoscopy  06/2007    Friable anal canal and anal papillae. Pancolonic diverticula. Normal terminal ileum. Status post segmental biopsy, descending colon biopsy showed minimal cryptitis. Biopsies felt to be  nonspecific but could be seen with NSAIDs. No features of inflammatory bowel disease. Stool studies were negative.  . Colonoscopy  03/21/2011    Procedure: COLONOSCOPY;  Surgeon: Daneil Dolin, MD;  Location: [facility];  Service: Endoscopy;  Laterality: N/A;  12:30  . Cholecystectomy  2008 - approximate  . Abdominal hysterectomy  2001 - approximate  . Lipoma excision  03/17/2012    Procedure: EXCISION LIPOMA;  Surgeon: Gwenyth Ober, MD;  Location: [facility];  Service: General;  Laterality: Right;  excision of right lower back lipoma  . Coronary angioplasty with stent placement      ROS:  As stated in the HPI and negative for all other systems.  PHYSICAL EXAM BP 165/74  Pulse 63  Ht 5' 2" (1.575 m)  Wt 197 lb (89.359 kg)  BMI 36.02 kg/m2 GENERAL:  Well appearing HEENT:  Pupils equal round and reactive, fundi not visualized, oral mucosa unremarkable, dentures NECK:  No jugular venous distention, waveform within normal limits, carotid upstroke brisk and symmetric, no bruits, no thyromegaly LYMPHATICS:  No cervical, inguinal adenopathy LUNGS:  Clear to auscultation bilaterally BACK:  No CVA tenderness CHEST:  Unremarkable HEART:  PMI not displaced or sustained,S1 and S2 within normal limits, no S3, no S4, no clicks, no rubs, no murmurs ABD:  Flat, positive bowel sounds  normal in frequency in pitch, no bruits, no rebound, no guarding, no midline pulsatile mass, no hepatomegaly, no splenomegaly EXT:  2 plus pulses throughout, no edema, no cyanosis no clubbing SKIN:  No rashes no nodules NEURO:  Cranial nerves II through XII grossly  intact, motor grossly intact throughout PSYCH:  Cognitively intact, oriented to person place and time   Rhonda Garcia has a CHA2DS2 - VASc score of 5 with a risk of stroke of 6.7%  and a HAS - BLED score of 1 with a moderate risk of bleeding.  Anticoagulation is indicated.  I discussed the risk and benefits.  She is tolerating warfarin. No change in therapy is indicated.  She has had no symptomatic paroxysms.  CAD, NATIVE VESSEL  She's had no chest discomfort. No cardiovascular testing is indicated.  HYPERTENSION, UNSPECIFIED Her blood pressure is elevated today. However, other readings that I can see recently have been well controlled. No change in therapy is indicated.  HYPERLIPIDEMIA Lipids are as listed below. He'll continue on the meds as listed. Lab Results  Component Value Date   CHOL 114 08/13/2013   TRIG 84 10/14/2013   HDL 48 10/14/2013   LDLCALC 86 10/14/2013

## 2013-12-23 NOTE — Patient Instructions (Signed)
The current medical regimen is effective;  continue present plan and medications.  Follow up in 6 months with Dr Hochrein.  You will receive a letter in the mail 2 months before you are due.  Please call us when you receive this letter to schedule your follow up appointment.  

## 2013-12-23 NOTE — Patient Instructions (Signed)
Anticoagulation Dose Instructions as of 12/23/2013     Rhonda Garcia Tue Wed Thu Fri Sat   New Dose 2.5 mg 5 mg 5 mg 5 mg 5 mg 5 mg 5 mg    Description       Take 1 and 1/2 tablets today - Wednesday, September 16th, then incease to Continue warfarin 5mg  1/2 tablet on sundays and 1 tablet all other days.       INR was 1.5 today

## 2014-01-15 ENCOUNTER — Encounter: Payer: Self-pay | Admitting: Family

## 2014-01-15 ENCOUNTER — Ambulatory Visit (INDEPENDENT_AMBULATORY_CARE_PROVIDER_SITE_OTHER): Payer: Medicare Other | Admitting: Family

## 2014-01-15 VITALS — BP 164/72 | HR 68 | Temp 96.6°F | Ht 62.0 in | Wt 200.4 lb

## 2014-01-15 DIAGNOSIS — M1990 Unspecified osteoarthritis, unspecified site: Secondary | ICD-10-CM

## 2014-01-15 DIAGNOSIS — I10 Essential (primary) hypertension: Secondary | ICD-10-CM

## 2014-01-15 DIAGNOSIS — Z1321 Encounter for screening for nutritional disorder: Secondary | ICD-10-CM

## 2014-01-15 DIAGNOSIS — E785 Hyperlipidemia, unspecified: Secondary | ICD-10-CM

## 2014-01-15 DIAGNOSIS — I480 Paroxysmal atrial fibrillation: Secondary | ICD-10-CM

## 2014-01-15 DIAGNOSIS — E119 Type 2 diabetes mellitus without complications: Secondary | ICD-10-CM

## 2014-01-15 DIAGNOSIS — L089 Local infection of the skin and subcutaneous tissue, unspecified: Secondary | ICD-10-CM

## 2014-01-15 DIAGNOSIS — N3281 Overactive bladder: Secondary | ICD-10-CM

## 2014-01-15 DIAGNOSIS — K219 Gastro-esophageal reflux disease without esophagitis: Secondary | ICD-10-CM

## 2014-01-15 LAB — POCT GLYCOSYLATED HEMOGLOBIN (HGB A1C): Hemoglobin A1C: 6.6

## 2014-01-15 LAB — POCT INR: INR: 3.6

## 2014-01-15 LAB — POCT UA - MICROALBUMIN

## 2014-01-15 MED ORDER — WARFARIN SODIUM 5 MG PO TABS: 5.0000 mg | ORAL_TABLET | Freq: Every day | ORAL | Status: DC

## 2014-01-15 MED ORDER — MIRABEGRON ER 25 MG PO TB24: 25.0000 mg | ORAL_TABLET | Freq: Every day | ORAL | Status: DC

## 2014-01-15 MED ORDER — MUPIROCIN 2 % EX OINT: 1.0000 "application " | TOPICAL_OINTMENT | Freq: Two times a day (BID) | CUTANEOUS | Status: DC

## 2014-01-15 NOTE — Patient Instructions (Addendum)
Overactive Bladder The bladder has two functions that are totally opposite of the other. One is to relax and stretch out so it can store urine (fills like a balloon), and the other is to contract and squeeze down so that it can empty the urine that it has stored. Proper functioning of the bladder is a complex mixing of these two functions. The filling and emptying of the bladder can be influenced by:  The bladder.  The spinal cord.  The brain.  The nerves going to the bladder.  Other organs that are closely related to the bladder such as prostate in males and the vagina in females. As your bladder fills with urine, nerve signals are sent from the bladder to the brain to tell you that you may need to urinate. Normal urination requires that the bladder squeeze down with sufficient strength to empty the bladder, but this also requires that the bladder squeeze down sufficiently long to finish the job. In addition the sphincter muscles, which normally keep you from leaking urine, must also relax so that the urine can pass. Coordination between the bladder muscle squeezing down and the sphincter muscles relaxing is required to make everything happen normally. With an overactive bladder sometimes the muscles of the bladder contract unexpectedly and involuntarily and this causes an urgent need to urinate. The normal response is to try to hold urine in by contracting the sphincter muscles. Sometimes the bladder contracts so strongly that the sphincter muscles cannot stop the urine from passing out and incontinence occurs. This kind of incontinence is called urge incontinence. Having an overactive bladder can be embarrassing and awkward. It can keep you from living life the way you want to. Many people think it is just something you have to put up with as you grow older or have certain health conditions. In fact, there are treatments that can help make your life easier and more pleasant. CAUSES  Many things  can cause an overactive bladder. Possibilities include:  Urinary tract infection or infection of nearby tissues such as the prostate.  Prostate enlargement.  In women, multiple pregnancies or surgery on the uterus or urethra.  Bladder stones, inflammation, or tumors.  Caffeine.  Alcohol.  Medications. For example, diuretics (drugs that help the body get rid of extra fluid) increase urine production. Some other medicines must be taken with lots of fluids.  Muscle or nerve weakness. This might be the result of a spinal cord injury, a stroke, multiple sclerosis, or Parkinson disease.  Diabetes can cause a high urine volume which fills the bladder so quickly that the normal urge to urinate is triggered very strongly. SYMPTOMS   Loss of bladder control. You feel the need to urinate and cannot make your body wait.  Sudden, strong urges to urinate.  Urinating 8 or more times a day.  Waking up to urinate two or more times a night. DIAGNOSIS  To decide if you have overactive bladder, your health care provider will probably:  Ask about symptoms you have noticed.  Ask about your overall health. This will include questions about any medications you are taking.  Do a physical examination. This will help determine if there are obvious blockages or other problems.  Order some tests. These might include:  A blood test to check for diabetes or other health issues that could be contributing to the problem.  Urine testing. This could measure the flow of urine and the pressure on the bladder.  A test of your neurological   system (the brain, spinal cord, and nerves). This is the system that senses the need to urinate. Some of these tests are called flow tests, bladder pressure tests, and electrical measurements of the sphincter muscle.  A bladder test to check whether it is emptying completely when you urinate.  Cystoscopy. This test uses a thin tube with a tiny camera on it. It offers a  look inside your urethra and bladder to see if there are problems.  Imaging tests. You might be given a contrast dye and then asked to urinate. X-rays are taken to see how your bladder is working. TREATMENT  An overactive bladder can be treated in many ways. The treatment will depend on the cause. Whether you have a mild or severe case also makes a difference. Often, treatment can be given in your health care provider's office or clinic. Be sure to discuss the different options with your caregiver. They include:  Behavioral treatments. These do not involve medication or surgery:  Bladder training. For this, you would follow a schedule to urinate at regular intervals. This helps you learn to control the urge to urinate. At first, you might be asked to wait a few minutes after feeling the urge. In time, you should be able to schedule bathroom visits an hour or more apart.  Kegel exercises. These exercises strengthen the pelvic floor muscles, which support the bladder. Toning these muscles can help control urination even if the bladder muscles are overactive. A specialist will teach you how to do these exercises correctly. They will require daily practice.  Weight loss. If you are obese or overweight, losing weight might stop your bladder from being overactive. Talk to your health care provider about how many pounds you should lose. Also ask if there is a specific program or method that would work best for you.  Diet change. This might be suggested if constipation is making your overactive bladder worse. Your health care provider or a nutritionist can explain ways to change what you eat to ease constipation. Other people might need to take in less caffeine or alcohol. Sometimes drinking fewer fluids is needed, too.  Protection. This is not an actual treatment. But, you could wear special pads to take care of any leakage while you wait for other treatments to take effect. This will help you avoid  embarrassment.  Physical treatments.  Electrical stimulation. Electrodes will send gentle pulses to the nerves or muscles that help control the bladder. The goal is to strengthen them. Sometimes this is done with the electrodes outside the body. Or, they might be placed inside the body (implanted). This treatment can take several months to have an effect.  Medications. These are usually used along with other treatments. Several medicines are available. Some are injected into the muscles involved in urination. Others come in pill form. Medications sometimes prescribed include:  Anticholinergics. These drugs block the signals that the nerves deliver to the bladder. This keeps it from releasing urine at the wrong time. Researchers think the drugs might help in other ways, too.  Imipramine. This is an antidepressant. But, it relaxes bladder muscles.  Botox. This is still experimental. Some people believe that injecting it into the bladder muscles will relax them so they work more normally. It has also been injected into the sphincter muscle when the sphincter muscle does not open properly. This is a temporary fix, however. Also, it might make matters worse, especially in older people.  Surgery.  A device might be implanted   to help manage your nerves. It works on the nerves that signal when you need to urinate.  Surgery is sometimes needed with electrical stimulation. If the electrodes are implanted, this is done through surgery.  Sometimes repairs need to be made through surgery. For example, the size of the bladder can be changed. This is usually done in severe cases only. HOME CARE INSTRUCTIONS   Take any medications your health care provider prescribed or suggested. Follow the directions carefully.  Practice any lifestyle changes that are recommended. These might include:  Drinking less fluid or drinking at different times of the day. If you need to urinate often during the night, for  example, you may need to stop drinking fluids early in the evening.  Cutting down on caffeine or alcohol. They can both make an overactive bladder worse. Caffeine is found in coffee, tea, and sodas.  Doing Kegel exercises to strengthen muscles.  Losing weight, if that is recommended.  Eating a healthy and balanced diet. This will help you avoid constipation.  Keep a journal or a log. You might be asked to record how much you drink and when, and also when you feel the need to urinate.  Learn how to care for implants or other devices, such as pessaries. SEEK MEDICAL CARE IF:   Your overactive bladder gets worse.  You feel increased pain or irritation when you urinate.  You notice blood in your urine.  You have questions about any medications or devices that your health care provider recommended.  You notice blood, pus, or swelling at the site of any test or treatment procedure.  You have an oral temperature above 102F (38.9C). SEEK IMMEDIATE MEDICAL CARE IF:  You have an oral temperature above 102F (38.9C), not controlled by medicine. Document Released: 01/20/2009 Document Revised: 08/10/2013 Document Reviewed: 01/20/2009 [facility] Patient Information 2015 [place], [place]. This information is not intended to replace advice given to you by your health care provider. Make sure you discuss any questions you have with your health care provider.  Anticoagulation Dose Instructions as of 01/15/2014     Dorene Grebe Tue Wed Thu Fri Sat   New Dose 2.5 mg 5 mg 5 mg 2.5 mg 5 mg 5 mg 5 mg    Description       Take 1/2 tab  Today (she already took it before appt) and hold Saturday dose 10/10 then decrease to Continue warfarin 5mg  1/2 tablet on sundays and Wednesday. 1 tablet all other days. F/U next week

## 2014-01-15 NOTE — Progress Notes (Signed)
Subjective:    Patient ID: Rhonda Garcia, female    DOB: [birth year], 73 y.o.   MRN: [id number]  Hypertension This is a chronic problem. The current episode started more than 1 year ago. The problem has been waxing and waning since onset. The problem is uncontrolled. Associated symptoms include anxiety, headaches and palpitations. Pertinent negatives include no chest pain, peripheral edema or shortness of breath. Risk factors for coronary artery disease include diabetes mellitus, dyslipidemia, family history, obesity and post-menopausal state. Past treatments include ACE inhibitors, diuretics, calcium channel blockers and beta blockers. The current treatment provides moderate improvement. Hypertensive end-organ damage includes CAD/MI. There is no history of kidney disease, CVA, heart failure or a thyroid problem. Identifiable causes of hypertension include sleep apnea.  Diabetes She presents for her follow-up diabetic visit. She has type 2 diabetes mellitus. Her disease course has been fluctuating. Hypoglycemia symptoms include headaches and nervousness/anxiousness. Pertinent negatives for hypoglycemia include no confusion or dizziness. Associated symptoms include foot paresthesias and visual change. Pertinent negatives for diabetes include no chest pain, no fatigue and no foot ulcerations. There are no hypoglycemic complications. Symptoms are stable. Diabetic complications include heart disease and peripheral neuropathy. Pertinent negatives for diabetic complications include no CVA. Risk factors for coronary artery disease include dyslipidemia, family history, hypertension, post-menopausal, stress and diabetes mellitus. Current diabetic treatment includes oral agent (dual therapy). She is compliant with treatment all of the time. She is following a generally unhealthy diet. She participates in exercise weekly. (Pt states she doesn't check her BS ) An ACE inhibitor/angiotensin II receptor blocker is being  taken. Eye exam is not current.  Hyperlipidemia This is a chronic problem. The current episode started more than 1 year ago. The problem is controlled. Recent lipid tests were reviewed and are normal. Exacerbating diseases include diabetes. She has no history of hypothyroidism. Factors aggravating her hyperlipidemia include fatty foods. Pertinent negatives include no chest pain, leg pain, myalgias or shortness of breath. Current antihyperlipidemic treatment includes statins and fibric acid derivatives. The current treatment provides significant improvement of lipids. Risk factors for coronary artery disease include diabetes mellitus, dyslipidemia, family history, hypertension and post-menopausal.  Gastrophageal Reflux She reports no belching, no chest pain, no heartburn or no sore throat. This is a chronic problem. The current episode started more than 1 year ago. The problem occurs rarely. The problem has been resolved. The symptoms are aggravated by certain foods. Pertinent negatives include no fatigue or melena. She has tried a PPI for the symptoms. The treatment provided significant relief.  Anxiety Presents for follow-up visit. Symptoms include insomnia, nervous/anxious behavior and palpitations. Patient reports no chest pain, confusion, dizziness or shortness of breath. Symptoms occur occasionally. The severity of symptoms is moderate.   Her past medical history is significant for anxiety/panic attacks. Past treatments include SSRIs and benzodiazephines. The treatment provided moderate relief. Compliance with prior treatments has been good.      Review of Systems  Constitutional: Negative.  Negative for fatigue.  HENT: Negative for sore throat.   Eyes: Negative.   Respiratory: Negative.  Negative for shortness of breath.   Cardiovascular: Positive for palpitations. Negative for chest pain.  Gastrointestinal: Negative.  Negative for heartburn and melena.  Endocrine: Negative.     Genitourinary: Negative.   Musculoskeletal: Negative.  Negative for myalgias.  Neurological: Positive for headaches. Negative for dizziness.  Hematological: Negative.   Psychiatric/Behavioral: Negative for confusion. The patient is nervous/anxious and has insomnia.   All other systems reviewed and  are negative.      Objective:   Physical Exam  Vitals reviewed. Constitutional: She is oriented to person, place, and time. She appears well-developed and well-nourished. No distress.  HENT:  Head: Normocephalic and atraumatic.  Right Ear: External ear normal.  Left Ear: External ear normal.  Nose: Nose normal.  Mouth/Throat: Oropharynx is clear and moist.  Eyes: Pupils are equal, round, and reactive to light.  Neck: Normal range of motion. Neck supple. No thyromegaly present.  Cardiovascular: Normal rate, regular rhythm, normal heart sounds and intact distal pulses.   No murmur heard. Pulmonary/Chest: Effort normal and breath sounds normal. No respiratory distress. She has no wheezes.  Abdominal: Soft. Bowel sounds are normal. She exhibits no distension. There is no tenderness.  Musculoskeletal: Normal range of motion. She exhibits no edema and no tenderness.  Neurological: She is alert and oriented to person, place, and time. She has normal reflexes. No cranial nerve deficit.  Skin: Skin is warm and dry.  Psychiatric: She has a normal mood and affect. Her behavior is normal. Judgment and thought content normal.   *See Diabetic foot note   BP 164/72  Pulse 68  Temp(Src) 96.6 F (35.9 C) (Oral)  Ht _0  (1.575 m)  Wt 200 lb 6.4 oz (90.901 kg)  BMI 36.64 kg/m2     Assessment & Plan:  1. Essential hypertension - CMP14+EGFR  2. Paroxysmal atrial fibrillation - warfarin (COUMADIN) 5 MG tablet; Take 1 tablet (5 mg total) by mouth daily.  Dispense: 30 tablet; Refill: 1 - CMP14+EGFR - POCT INR  3. Gastroesophageal reflux disease without esophagitis - CMP14+EGFR  4. Diabetes  mellitus type 2, uncomplicated - POCT glycosylated hemoglobin (Hb A1C) - POCT UA - Microalbumin - CMP14+EGFR  5. Osteoarthritis, unspecified osteoarthritis type, unspecified site - CMP14+EGFR  6. Hyperlipidemia - CMP14+EGFR - Lipid panel  7. OAB (overactive bladder) - CMP14+EGFR - mirabegron ER (MYRBETRIQ) 25 MG TB24 tablet; Take 1 tablet (25 mg total) by mouth daily.  Dispense: 90 tablet; Refill: 3  8. Skin infection - mupirocin ointment (BACTROBAN) 2 %; Place 1 application into the nose 2 (two) times daily.  Dispense: 22 g; Refill: 0  9. Encounter for vitamin deficiency screening - Vit D  25 hydroxy (rtn osteoporosis monitoring)   Continue all meds Labs pending Health Maintenance reviewed Diet and exercise encouraged RTO 4 months  Evelina Dun, FNP

## 2014-01-16 LAB — CMP14+EGFR
ALT: 13 IU/L (ref 0–32)
AST: 15 IU/L (ref 0–40)
Albumin/Globulin Ratio: 2 (ref 1.1–2.5)
Albumin: 4.3 g/dL (ref 3.5–4.8)
Alkaline Phosphatase: 74 IU/L (ref 39–117)
BUN/Creatinine Ratio: 16 (ref 11–26)
BUN: 14 mg/dL (ref 8–27)
CO2: 27 mmol/L (ref 18–29)
Calcium: 9.9 mg/dL (ref 8.7–10.3)
Chloride: 102 mmol/L (ref 97–108)
Creatinine, Ser: 0.86 mg/dL (ref 0.57–1.00)
GFR calc Af Amer: 78 mL/min/{1.73_m2} (ref >59)
GFR calc non Af Amer: 68 mL/min/{1.73_m2} (ref >59)
Globulin, Total: 2.1 g/dL (ref 1.5–4.5)
Glucose: 127 mg/dL — ABNORMAL HIGH (ref 65–99)
Potassium: 5.5 mmol/L — ABNORMAL HIGH (ref 3.5–5.2)
Sodium: 142 mmol/L (ref 134–144)
Total Bilirubin: 0.7 mg/dL (ref 0.0–1.2)
Total Protein: 6.4 g/dL (ref 6.0–8.5)

## 2014-01-16 LAB — LIPID PANEL
Chol/HDL Ratio: 3.8 ratio units (ref 0.0–4.4)
Cholesterol, Total: 183 mg/dL (ref 100–199)
HDL: 48 mg/dL (ref >39)
LDL Calculated: 115 mg/dL — ABNORMAL HIGH (ref 0–99)
Triglycerides: 99 mg/dL (ref 0–149)
VLDL Cholesterol Cal: 20 mg/dL (ref 5–40)

## 2014-01-16 LAB — VITAMIN D 25 HYDROXY (VIT D DEFICIENCY, FRACTURES): Vit D, 25-Hydroxy: 29.6 ng/mL — ABNORMAL LOW (ref 30.0–100.0)

## 2014-01-18 ENCOUNTER — Other Ambulatory Visit: Payer: Self-pay | Admitting: Family

## 2014-01-18 DIAGNOSIS — E875 Hyperkalemia: Secondary | ICD-10-CM

## 2014-01-18 DIAGNOSIS — E785 Hyperlipidemia, unspecified: Secondary | ICD-10-CM

## 2014-01-18 DIAGNOSIS — E559 Vitamin D deficiency, unspecified: Secondary | ICD-10-CM

## 2014-01-18 MED ORDER — PRAVASTATIN SODIUM 80 MG PO TABS: 80.0000 mg | ORAL_TABLET | Freq: Every day | ORAL | Status: DC

## 2014-01-18 MED ORDER — VITAMIN D (ERGOCALCIFEROL) 1.25 MG (50000 UNIT) PO CAPS: 50000.0000 [IU] | ORAL_CAPSULE | ORAL | Status: DC

## 2014-01-19 ENCOUNTER — Telehealth: Payer: Self-pay | Admitting: Family Medicine

## 2014-01-19 NOTE — Telephone Encounter (Signed)
Message copied by Cline Crock on Tue Jan 19, 2014  3:04 PM ------      Message from: [place], [place] A      Created: Mon Jan 18, 2014 11:28 AM       Hgb A1C WNL      Kidney and liver function stable      Potassium elevated- Pt needs to have lab redrawn- Orders in EPIC      Cholesterol elevated- Pravastatin increased to 80 mg daily      Vit D levels low- RX sent to pharmacy ------

## 2014-02-03 ENCOUNTER — Encounter: Payer: Self-pay | Admitting: Family Medicine

## 2014-02-11 ENCOUNTER — Other Ambulatory Visit: Payer: Self-pay | Admitting: Pharmacist

## 2014-02-11 ENCOUNTER — Encounter: Payer: Self-pay | Admitting: Pharmacist

## 2014-02-11 ENCOUNTER — Ambulatory Visit (INDEPENDENT_AMBULATORY_CARE_PROVIDER_SITE_OTHER): Payer: Medicare Other | Admitting: Pharmacist

## 2014-02-11 VITALS — BP 150/70 | HR 72 | Ht 62.0 in | Wt 205.0 lb

## 2014-02-11 DIAGNOSIS — Z0000 Encounter for general adult medical examination without abnormal findings: Secondary | ICD-10-CM

## 2014-02-11 DIAGNOSIS — M19031 Primary osteoarthritis, right wrist: Secondary | ICD-10-CM

## 2014-02-11 DIAGNOSIS — H9191 Unspecified hearing loss, right ear: Secondary | ICD-10-CM

## 2014-02-11 DIAGNOSIS — I480 Paroxysmal atrial fibrillation: Secondary | ICD-10-CM

## 2014-02-11 LAB — POCT INR: INR: 1.6

## 2014-02-11 MED ORDER — WARFARIN SODIUM 5 MG PO TABS: 5.0000 mg | ORAL_TABLET | Freq: Every day | ORAL | Status: DC

## 2014-02-11 MED ORDER — HYDROCODONE-ACETAMINOPHEN 5-325 MG PO TABS: 1.0000 | ORAL_TABLET | Freq: Four times a day (QID) | ORAL | Status: DC | PRN

## 2014-02-11 NOTE — Progress Notes (Signed)
Subjective:    Rhonda Garcia is a 73 y.o. female who presents for Medicare Initial Wellness Visit and recheck protime  Preventive Screening-Counseling & Management  Tobacco History  Smoking status  . Former Smoker -- 0.50 packs/day for 0 years  . Types: Cigarettes  . Quit date: 03/13/1981  Smokeless tobacco  . Never Used    Comment: quit 30 +years ago     Current Problems (verified) Patient Active Problem List   Diagnosis Date Noted  . Paroxysmal atrial fibrillation 11/02/2013  . Long term (current) use of anticoagulants 11/02/2013  . Atrial fibrillation 10/14/2013  . Tinea corporis 10/28/2012  . Depression 10/28/2012  . Diabetes mellitus type 2, uncomplicated 40/11/6759  . Lipoma of back 02/12/2012  . Constipation 02/28/2011  . Esophageal dysphagia 02/28/2011  . Gastroesophageal reflux 02/28/2011  . Early satiety 02/28/2011  . POSITIONAL VERTIGO 04/29/2009  . Osteoarthritis 12/23/2008  . Hyperlipidemia 12/21/2008  . Essential hypertension 12/21/2008  . CAD, NATIVE VESSEL 12/21/2008    Medications Prior to Visit Current Outpatient Prescriptions on File Prior to Visit  Medication Sig Dispense Refill  . Alcohol Swabs 70 % PADS by Does not apply route 2 (two) times daily.      Marland Kitchen amLODipine (NORVASC) 5 MG tablet Take 1 tablet (5 mg total) by mouth daily. 90 tablet 3  . Blood Glucose Monitoring Suppl (GLUCOCOM MONITOR) W/DEVICE KIT by Does not apply route 2 (two) times daily.      Marland Kitchen ezetimibe (ZETIA) 10 MG tablet Take 1 tablet (10 mg total) by mouth daily. 90 tablet 3  . glucose blood test strip 1 each by Other route 2 (two) times daily. Use as instructed     . isosorbide mononitrate (IMDUR) 30 MG 24 hr tablet Take 1 tablet (30 mg total) by mouth daily. 90 tablet 3  . Lancets MISC by Does not apply route 2 (two) times daily.      Marland Kitchen lisinopril-hydrochlorothiazide (PRINZIDE,ZESTORETIC) 20-12.5 MG per tablet Take 2 tablets by mouth daily. 180 tablet 6  . LORazepam (ATIVAN)  0.5 MG tablet TAKE ONE TABLET EVERY 12 HOURS 60 tablet 0  . meclizine (ANTIVERT) 25 MG tablet Take 1 tablet (25 mg total) by mouth 3 (three) times daily as needed. 30 tablet 0  . metFORMIN (GLUCOPHAGE) 500 MG tablet Take 2 tablets (1,000 mg total) by mouth daily with breakfast. 180 tablet 3  . metoprolol (LOPRESSOR) 50 MG tablet Take 1 tablet (50 mg total) by mouth 2 (two) times daily. 180 tablet 3  . mirabegron ER (MYRBETRIQ) 25 MG TB24 tablet Take 1 tablet (25 mg total) by mouth daily. 90 tablet 3  . mupirocin ointment (BACTROBAN) 2 % Place 1 application into the nose 2 (two) times daily. 22 g 0  . nitroGLYCERIN (NITROSTAT) 0.4 MG SL tablet Place 0.4 mg under the tongue every 5 (five) minutes as needed.      . NON FORMULARY Take 1,500 mg by mouth every other day. Lipozene    . pantoprazole (PROTONIX) 40 MG tablet Take 1 tablet (40 mg total) by mouth daily. 30 tablet 5  . pioglitazone (ACTOS) 30 MG tablet Take 1 tablet (30 mg total) by mouth daily. 90 tablet 3  . polyethylene glycol powder (CLEARLAX) powder Take 17 g by mouth as needed.    . pravastatin (PRAVACHOL) 80 MG tablet Take 1 tablet (80 mg total) by mouth daily. 90 tablet 3  . ranitidine (ZANTAC) 150 MG tablet Take 1 tablet (150 mg total) by mouth 2 (  two) times daily. 180 tablet 3  . sertraline (ZOLOFT) 50 MG tablet Take 1 tablet (50 mg total) by mouth daily. 90 tablet 3  . Vitamin D, Ergocalciferol, (DRISDOL) 50000 UNITS CAPS capsule Take 1 capsule (50,000 Units total) by mouth every 7 (seven) days. 30 capsule 6  . ketoconazole (NIZORAL) 2 % cream      No current facility-administered medications on file prior to visit.    Current Medications (verified) Current Outpatient Prescriptions  Medication Sig Dispense Refill  . Alcohol Swabs 70 % PADS by Does not apply route 2 (two) times daily.      Marland Kitchen amLODipine (NORVASC) 5 MG tablet Take 1 tablet (5 mg total) by mouth daily. 90 tablet 3  . Blood Glucose Monitoring Suppl (GLUCOCOM  MONITOR) W/DEVICE KIT by Does not apply route 2 (two) times daily.      Marland Kitchen ezetimibe (ZETIA) 10 MG tablet Take 1 tablet (10 mg total) by mouth daily. 90 tablet 3  . glucose blood test strip 1 each by Other route 2 (two) times daily. Use as instructed     . HYDROcodone-acetaminophen (LORCET) 5-325 MG per tablet Take 1 tablet by mouth every 6 (six) hours as needed. 60 tablet 0  . isosorbide mononitrate (IMDUR) 30 MG 24 hr tablet Take 1 tablet (30 mg total) by mouth daily. 90 tablet 3  . Lancets MISC by Does not apply route 2 (two) times daily.      Marland Kitchen lisinopril-hydrochlorothiazide (PRINZIDE,ZESTORETIC) 20-12.5 MG per tablet Take 2 tablets by mouth daily. 180 tablet 6  . LORazepam (ATIVAN) 0.5 MG tablet TAKE ONE TABLET EVERY 12 HOURS 60 tablet 0  . meclizine (ANTIVERT) 25 MG tablet Take 1 tablet (25 mg total) by mouth 3 (three) times daily as needed. 30 tablet 0  . metFORMIN (GLUCOPHAGE) 500 MG tablet Take 2 tablets (1,000 mg total) by mouth daily with breakfast. 180 tablet 3  . metoprolol (LOPRESSOR) 50 MG tablet Take 1 tablet (50 mg total) by mouth 2 (two) times daily. 180 tablet 3  . mirabegron ER (MYRBETRIQ) 25 MG TB24 tablet Take 1 tablet (25 mg total) by mouth daily. 90 tablet 3  . mupirocin ointment (BACTROBAN) 2 % Place 1 application into the nose 2 (two) times daily. 22 g 0  . nitroGLYCERIN (NITROSTAT) 0.4 MG SL tablet Place 0.4 mg under the tongue every 5 (five) minutes as needed.      . NON FORMULARY Take 1,500 mg by mouth every other day. Lipozene    . pantoprazole (PROTONIX) 40 MG tablet Take 1 tablet (40 mg total) by mouth daily. 30 tablet 5  . pioglitazone (ACTOS) 30 MG tablet Take 1 tablet (30 mg total) by mouth daily. 90 tablet 3  . polyethylene glycol powder (CLEARLAX) powder Take 17 g by mouth as needed.    . pravastatin (PRAVACHOL) 80 MG tablet Take 1 tablet (80 mg total) by mouth daily. 90 tablet 3  . ranitidine (ZANTAC) 150 MG tablet Take 1 tablet (150 mg total) by mouth 2 (two)  times daily. 180 tablet 3  . sertraline (ZOLOFT) 50 MG tablet Take 1 tablet (50 mg total) by mouth daily. 90 tablet 3  . Vitamin D, Ergocalciferol, (DRISDOL) 50000 UNITS CAPS capsule Take 1 capsule (50,000 Units total) by mouth every 7 (seven) days. 30 capsule 6  . warfarin (COUMADIN) 5 MG tablet Take 1 tablet (5 mg total) by mouth daily. 90 tablet 1  . ketoconazole (NIZORAL) 2 % cream  No current facility-administered medications for this visit.     Allergies (verified) Promethazine hcl; Penicillins; and Promethazine hcl   PAST HISTORY  Family History Family History  Problem Relation Age of Onset  . Heart attack Mother   . Hypertension Mother   . Stroke Mother     Deceased, age 58  . Diabetes Sister   . Diabetes Sister   . Cancer Sister     ovarian  . Stroke Sister   . Diabetes Brother     also has a blood disorder   . Clotting disorder Brother   . Melanoma Brother     deceased  . Coronary artery disease Other   . Kidney disease Other   . Colon cancer Neg Hx     Social History History  Substance Use Topics  . Smoking status: Former Smoker -- 0.50 packs/day for 0 years    Types: Cigarettes    Quit date: 03/13/1981  . Smokeless tobacco: Never Used     Comment: quit 30 +years ago  . Alcohol Use: No     Are there smokers in your home (other than you)? No  Risk Factors Current exercise habits: The patient does not participate in regular exercise at present.  Dietary issues discussed: limiting CHO intake   Cardiac risk factors: advanced age (older than 37 for men, 20 for women), diabetes mellitus, dyslipidemia, hypertension, obesity (BMI >= 30 kg/m2) and sedentary lifestyle.  Depression Screen (Note: if answer to either of the following is "Yes", a more complete depression screening is indicated)   Over the past 2 weeks, have you felt down, depressed or hopeless? Yes - about 5 days  Over the past 2 weeks, have you felt little interest or pleasure in doing  things? No  Have you lost interest or pleasure in daily life? No  Do you often feel hopeless? No  Do you cry easily over simple problems? No  Activities of Daily Living In your present state of health, do you have any difficulty performing the following activities?:  Driving? No Managing money?  No Feeding yourself? No Getting from bed to chair? No  Climbing a flight of stairs? No Preparing food and eating?: No Bathing or showering? No Getting dressed: No Getting to the toilet? No Using the toilet:No Moving around from place to place: No In the past year have you fallen or had a near fall?:Yes   Are you sexually active?  Yes  Do you have more than one partner?  No  Hearing Difficulties: Yes - reports decreased hearing in right ear when taking on phone so uses left ear. Do you often ask people to speak up or repeat themselves? Yes Do you experience ringing or noises in your ears? No Do you have difficulty understanding soft or whispered voices? No   Do you feel that you have a problem with memory? No - patient mentions diagnosis of early dementia  Do you often misplace items? No  Do you feel safe at home?  Yes  Cognitive Testing  Alert? Yes  Normal Appearance?Yes  Oriented to person? Yes  Place? Yes   Time? Yes  Recall of three objects?  Yes  Can perform simple calculations? Yes  Displays appropriate judgment?Yes  Can read the correct time from a watch face?Yes   Advanced Directives have been discussed with the patient? Yes  List the Names of Other Physician/Practitioners you currently use: 1.  GI - Dr Gala Romney / [place] 2.  Cardiologist - [place] 3.  Optomotrist - [place] any recent Medical Services you may have received from other than [facility] providers in the past year (date may be approximate).  Immunization History  Administered Date(s) Administered  . Influenza,inj,Quad PF,36+ Mos 01/19/2013  . Pneumococcal Conjugate-13 08/13/2013  . Tdap 12/18/2012     Screening Tests Health Maintenance  Topic Date Due  . ZOSTAVAX  05/18/2014 (Originally 06/25/2001)  . OPHTHALMOLOGY EXAM  07/17/2014 (Originally 06/26/1950)  . HEMOGLOBIN A1C  07/17/2014  . INFLUENZA VACCINE  11/08/2014  . FOOT EXAM  01/16/2015  . URINE MICROALBUMIN  01/16/2015  . MAMMOGRAM  02/05/2016  . COLONOSCOPY  03/21/2021  . TETANUS/TDAP  12/19/2022  . PNEUMOCOCCAL POLYSACCHARIDE VACCINE AGE 59 AND OVER  Addressed    All answers were reviewed with the patient and necessary referrals were made:  Cherre Robins, [facility]   02/11/2014   History reviewed: allergies, current medications, past family history, past medical history, past social history, past surgical history and problem list  Objective:     Body mass index is 37.49 kg/(m^2). BP 150/70 mmHg  Pulse 72  Ht 5' 2"  (1.575 m)  Wt 205 lb (92.987 kg)  BMI 37.49 kg/m2  INR was 1.6 today     Assessment:     Subtherapeutic Anticoagulation Initial Medicare Wellness Visit     Plan:     During the course of the visit the patient was educated and counseled about appropriate screening and preventive services including:    Pneumococcal vaccine  - UTD  Influenza vaccine - UTD  Td vaccine - UTD  Screening mammography - Due now - patient to make app  Screening Pap smear and pelvic exam  - N/A  Bone densitometry screening - UTD  Colorectal cancer screening UTD  Glaucoma screening and diebetic eye exam - patient has appt with Dr Okey Regal, DO 02/16/2014  Nutrition counseling - reviewed basic CHO counting  Referral to audiologist for decreased hearing in right ear  Advanced directives: has NO advanced directive  - add't info requested  Anticoagulation Dose Instructions as of 02/11/2014      Dorene Grebe Tue Wed Thu Fri Sat   New Dose 2.5 mg 5 mg 5 mg 2.5 mg 5 mg 5 mg 5 mg    Description        Take 1 and 1/2 tablets today (02/11/2014) and tomorrow (02/12/2014), then restart dose of 1/2 tablet on  sundays and wednesdays and 1 tablet all other days.      RTC in 1 month to recheck    Patient Instructions (the written plan) was given to the patient.  Medicare Attestation I have personally reviewed: The patient's medical and social history Their use of alcohol, tobacco or illicit drugs Their current medications and supplements The patient's functional ability including ADLs,fall risks, home safety risks, cognitive, and hearing and visual impairment Diet and physical activities Evidence for depression or mood disorders  The patient's weight, height, BMI, and visual acuity have been recorded in the chart.  I have made referrals, counseling, and provided education to the patient based on review of the above and I have provided the patient with a written personalized care plan for preventive services.     Cherre Robins, [facility]   02/11/2014

## 2014-02-11 NOTE — Patient Instructions (Addendum)
Anticoagulation Dose Instructions as of 02/11/2014      Rhonda Garcia Tue Wed Thu Fri Sat   New Dose 2.5 mg 5 mg 5 mg 2.5 mg 5 mg 5 mg 5 mg    Description        Take 1 and 1/2 tablets today (02/11/2014) and tomorrow (02/12/2014), then restart dose of 1/2 tablet on sundays and wednesdays and 1 tablet all other days.      INR was 1.6 today   Health Maintenance Summary     ZOSTAVAX Postponed 05/18/2014 Not covered by insurance    OPHTHALMOLOGY EXAM Postponed 07/17/2014 Has appt 02/16/2014    HEMOGLOBIN A1C Next Due 07/17/2014 Last was 6.6% (10/14/2013)    INFLUENZA VACCINE Next Due 11/08/2014 Last was 12/16/2013   Bone Density Next Due 08/07/2014 Last 08/06/2012    FOOT EXAM Next Due 01/16/2015 Last 01/15/2014    URINE MICROALBUMIN Next Due 01/16/2015 Recheck 04/2014    MAMMOGRAM Next Due 02/05/2016 Last 02/04/2014    COLONOSCOPY Next Due 03/21/2021 Last 2012    TETANUS/TDAP Next Due 12/19/2022 Last 12/18/2012       Preventive Care for Adults A healthy lifestyle and preventive care can promote health and wellness. Preventive health guidelines for women include the following key practices.  A routine yearly physical is a good way to check with your health care provider about your health and preventive screening. It is a chance to share any concerns and updates on your health and to receive a thorough exam.  Visit your dentist for a routine exam and preventive care every 6 months. Brush your teeth twice a day and floss once a day. Good oral hygiene prevents tooth decay and gum disease.  The frequency of eye exams is based on your age, health, family medical history, use of contact lenses, and other factors. Follow your health care provider's recommendations for frequency of eye exams.  Eat a healthy diet. Foods like vegetables, fruits, whole grains, low-fat dairy products, and lean protein foods contain the nutrients you need without too many calories. Decrease your intake of foods high in solid fats,  added sugars, and salt. Eat the right amount of calories for you.Get information about a proper diet from your health care provider, if necessary.  Regular physical exercise is one of the most important things you can do for your health. Most adults should get at least 150 minutes of moderate-intensity exercise (any activity that increases your heart rate and causes you to sweat) each week. In addition, most adults need muscle-strengthening exercises on 2 or more days a week.  Maintain a healthy weight. The body mass index (BMI) is a screening tool to identify possible weight problems. It provides an estimate of body fat based on height and weight. Your health care provider can find your BMI and can help you achieve or maintain a healthy weight.For adults 20 years and older:  A BMI below 18.5 is considered underweight.  A BMI of 18.5 to 24.9 is normal.  A BMI of 25 to 29.9 is considered overweight.  A BMI of 30 and above is considered obese.  Maintain normal blood lipids and cholesterol levels by exercising and minimizing your intake of saturated fat. Eat a balanced diet with plenty of fruit and vegetables. Blood tests for lipids and cholesterol should begin at age 70 and be repeated every 5 years. If your lipid or cholesterol levels are high, you are over 50, or you are at high risk for heart disease, you  may need your cholesterol levels checked more frequently.Ongoing high lipid and cholesterol levels should be treated with medicines if diet and exercise are not working.  If you smoke, find out from your health care provider how to quit. If you do not use tobacco, do not start.  Lung cancer screening is recommended for adults aged 31-80 years who are at high risk for developing lung cancer because of a history of smoking. A yearly low-dose CT scan of the lungs is recommended for people who have at least a 30-pack-year history of smoking and are a current smoker or have quit within the past 15  years. A pack year of smoking is smoking an average of 1 pack of cigarettes a day for 1 year (for example: 1 pack a day for 30 years or 2 packs a day for 15 years). Yearly screening should continue until the smoker has stopped smoking for at least 15 years. Yearly screening should be stopped for people who develop a health problem that would prevent them from having lung cancer treatment.  If you are pregnant, do not drink alcohol. If you are breastfeeding, be very cautious about drinking alcohol. If you are not pregnant and choose to drink alcohol, do not have more than 1 drink per day. One drink is considered to be 12 ounces (355 mL) of beer, 5 ounces (148 mL) of wine, or 1.5 ounces (44 mL) of liquor.  Avoid use of street drugs. Do not share needles with anyone. Ask for help if you need support or instructions about stopping the use of drugs.  High blood pressure causes heart disease and increases the risk of stroke. Your blood pressure should be checked at least every 1 to 2 years. Ongoing high blood pressure should be treated with medicines if weight loss and exercise do not work.  If you are 55-66 years old, ask your health care provider if you should take aspirin to prevent strokes.  Diabetes screening involves taking a blood sample to check your fasting blood sugar level. This should be done once every 3 years, after age 32, if you are within normal weight and without risk factors for diabetes. Testing should be considered at a younger age or be carried out more frequently if you are overweight and have at least 1 risk factor for diabetes.  Breast cancer screening is essential preventive care for women. You should practice "breast self-awareness." This means understanding the normal appearance and feel of your breasts and may include breast self-examination. Any changes detected, no matter how small, should be reported to a health care provider. Women in their 39s and 30s should have a clinical  breast exam (CBE) by a health care provider as part of a regular health exam every 1 to 3 years. After age 66, women should have a CBE every year. Starting at age 85, women should consider having a mammogram (breast X-ray test) every year. Women who have a family history of breast cancer should talk to their health care provider about genetic screening. Women at a high risk of breast cancer should talk to their health care providers about having an MRI and a mammogram every year.  Breast cancer gene (BRCA)-related cancer risk assessment is recommended for women who have family members with BRCA-related cancers. BRCA-related cancers include breast, ovarian, tubal, and peritoneal cancers. Having family members with these cancers may be associated with an increased risk for harmful changes (mutations) in the breast cancer genes BRCA1 and BRCA2. Results of the  assessment will determine the need for genetic counseling and BRCA1 and BRCA2 testing.  Routine pelvic exams to screen for cancer are no longer recommended for nonpregnant women who are considered low risk for cancer of the pelvic organs (ovaries, uterus, and vagina) and who do not have symptoms. Ask your health care provider if a screening pelvic exam is right for you.  If you have had past treatment for cervical cancer or a condition that could lead to cancer, you need Pap tests and screening for cancer for at least 20 years after your treatment. If Pap tests have been discontinued, your risk factors (such as having a new sexual partner) need to be reassessed to determine if screening should be resumed. Some women have medical problems that increase the chance of getting cervical cancer. In these cases, your health care provider may recommend more frequent screening and Pap tests.  The HPV test is an additional test that may be used for cervical cancer screening. The HPV test looks for the virus that can cause the cell changes on the cervix. The cells  collected during the Pap test can be tested for HPV. The HPV test could be used to screen women aged 47 years and older, and should be used in women of any age who have unclear Pap test results. After the age of 68, women should have HPV testing at the same frequency as a Pap test.  Colorectal cancer can be detected and often prevented. Most routine colorectal cancer screening begins at the age of 7 years and continues through age 91 years. However, your health care provider may recommend screening at an earlier age if you have risk factors for colon cancer. On a yearly basis, your health care provider may provide home test kits to check for hidden blood in the stool. Use of a small camera at the end of a tube, to directly examine the colon (sigmoidoscopy or colonoscopy), can detect the earliest forms of colorectal cancer. Talk to your health care provider about this at age 22, when routine screening begins. Direct exam of the colon should be repeated every 5-10 years through age 36 years, unless early forms of pre-cancerous polyps or small growths are found.  People who are at an increased risk for hepatitis B should be screened for this virus. You are considered at high risk for hepatitis B if:  You were born in a country where hepatitis B occurs often. Talk with your health care provider about which countries are considered high risk.  Your parents were born in a high-risk country and you have not received a shot to protect against hepatitis B (hepatitis B vaccine).  You have HIV or AIDS.  You use needles to inject street drugs.  You live with, or have sex with, someone who has hepatitis B.  You get hemodialysis treatment.  You take certain medicines for conditions like cancer, organ transplantation, and autoimmune conditions.  Hepatitis C blood testing is recommended for all people born from [birth year] through 1965 and any individual with known risks for hepatitis C.  Practice safe sex. Use  condoms and avoid high-risk sexual practices to reduce the spread of sexually transmitted infections (STIs). STIs include gonorrhea, chlamydia, syphilis, trichomonas, herpes, HPV, and human immunodeficiency virus (HIV). Herpes, HIV, and HPV are viral illnesses that have no cure. They can result in disability, cancer, and death.  You should be screened for sexually transmitted illnesses (STIs) including gonorrhea and chlamydia if:  You are sexually active and  are younger than 24 years.  You are older than 24 years and your health care provider tells you that you are at risk for this type of infection.  Your sexual activity has changed since you were last screened and you are at an increased risk for chlamydia or gonorrhea. Ask your health care provider if you are at risk.  If you are at risk of being infected with HIV, it is recommended that you take a prescription medicine daily to prevent HIV infection. This is called preexposure prophylaxis (PrEP). You are considered at risk if:  You are a heterosexual woman, are sexually active, and are at increased risk for HIV infection.  You take drugs by injection.  You are sexually active with a partner who has HIV.  Talk with your health care provider about whether you are at high risk of being infected with HIV. If you choose to begin PrEP, you should first be tested for HIV. You should then be tested every 3 months for as long as you are taking PrEP.  Osteoporosis is a disease in which the bones lose minerals and strength with aging. This can result in serious bone fractures or breaks. The risk of osteoporosis can be identified using a bone density scan. Women ages 90 years and over and women at risk for fractures or osteoporosis should discuss screening with their health care providers. Ask your health care provider whether you should take a calcium supplement or vitamin D to reduce the rate of osteoporosis.  Menopause can be associated with  physical symptoms and risks. Hormone replacement therapy is available to decrease symptoms and risks. You should talk to your health care provider about whether hormone replacement therapy is right for you.  Use sunscreen. Apply sunscreen liberally and repeatedly throughout the day. You should seek shade when your shadow is shorter than you. Protect yourself by wearing long sleeves, pants, a wide-brimmed hat, and sunglasses year round, whenever you are outdoors.  Once a month, do a whole body skin exam, using a mirror to look at the skin on your back. Tell your health care provider of new moles, moles that have irregular borders, moles that are larger than a pencil eraser, or moles that have changed in shape or color.  Stay current with required vaccines (immunizations).  Influenza vaccine. All adults should be immunized every year.  Tetanus, diphtheria, and acellular pertussis (Td, Tdap) vaccine. Pregnant women should receive 1 dose of Tdap vaccine during each pregnancy. The dose should be obtained regardless of the length of time since the last dose. Immunization is preferred during the 27th-36th week of gestation. An adult who has not previously received Tdap or who does not know her vaccine status should receive 1 dose of Tdap. This initial dose should be followed by tetanus and diphtheria toxoids (Td) booster doses every 10 years. Adults with an unknown or incomplete history of completing a 3-dose immunization series with Td-containing vaccines should begin or complete a primary immunization series including a Tdap dose. Adults should receive a Td booster every 10 years.  Varicella vaccine. An adult without evidence of immunity to varicella should receive 2 doses or a second dose if she has previously received 1 dose. Pregnant females who do not have evidence of immunity should receive the first dose after pregnancy. This first dose should be obtained before leaving the health care facility. The  second dose should be obtained 4-8 weeks after the first dose.  Human papillomavirus (HPV) vaccine. Females aged  13-26 years who have not received the vaccine previously should obtain the 3-dose series. The vaccine is not recommended for use in pregnant females. However, pregnancy testing is not needed before receiving a dose. If a female is found to be pregnant after receiving a dose, no treatment is needed. In that case, the remaining doses should be delayed until after the pregnancy. Immunization is recommended for any person with an immunocompromised condition through the age of 22 years if she did not get any or all doses earlier. During the 3-dose series, the second dose should be obtained 4-8 weeks after the first dose. The third dose should be obtained 24 weeks after the first dose and 16 weeks after the second dose.  Zoster vaccine. One dose is recommended for adults aged 18 years or older unless certain conditions are present.  Measles, mumps, and rubella (MMR) vaccine. Adults born before [birth year] generally are considered immune to measles and mumps. Adults born in [birth year] or later should have 1 or more doses of MMR vaccine unless there is a contraindication to the vaccine or there is laboratory evidence of immunity to each of the three diseases. A routine second dose of MMR vaccine should be obtained at least 28 days after the first dose for students attending postsecondary schools, health care workers, or international travelers. People who received inactivated measles vaccine or an unknown type of measles vaccine during 1963-1967 should receive 2 doses of MMR vaccine. People who received inactivated mumps vaccine or an unknown type of mumps vaccine before 1979 and are at high risk for mumps infection should consider immunization with 2 doses of MMR vaccine. For females of childbearing age, rubella immunity should be determined. If there is no evidence of immunity, females who are not pregnant should be  vaccinated. If there is no evidence of immunity, females who are pregnant should delay immunization until after pregnancy. Unvaccinated health care workers born before 50 who lack laboratory evidence of measles, mumps, or rubella immunity or laboratory confirmation of disease should consider measles and mumps immunization with 2 doses of MMR vaccine or rubella immunization with 1 dose of MMR vaccine.  Pneumococcal 13-valent conjugate (PCV13) vaccine. When indicated, a person who is uncertain of her immunization history and has no record of immunization should receive the PCV13 vaccine. An adult aged 70 years or older who has certain medical conditions and has not been previously immunized should receive 1 dose of PCV13 vaccine. This PCV13 should be followed with a dose of pneumococcal polysaccharide (PPSV23) vaccine. The PPSV23 vaccine dose should be obtained at least 8 weeks after the dose of PCV13 vaccine. An adult aged 85 years or older who has certain medical conditions and previously received 1 or more doses of PPSV23 vaccine should receive 1 dose of PCV13. The PCV13 vaccine dose should be obtained 1 or more years after the last PPSV23 vaccine dose.  Pneumococcal polysaccharide (PPSV23) vaccine. When PCV13 is also indicated, PCV13 should be obtained first. All adults aged 58 years and older should be immunized. An adult younger than age 2 years who has certain medical conditions should be immunized. Any person who resides in a nursing home or long-term care facility should be immunized. An adult smoker should be immunized. People with an immunocompromised condition and certain other conditions should receive both PCV13 and PPSV23 vaccines. People with human immunodeficiency virus (HIV) infection should be immunized as soon as possible after diagnosis. Immunization during chemotherapy or radiation therapy should be avoided. Routine use of  PPSV23 vaccine is not recommended for American Indians, [place]  Natives, or people younger than 18 years unless there are medical conditions that require PPSV23 vaccine. When indicated, people who have unknown immunization and have no record of immunization should receive PPSV23 vaccine. One-time revaccination 5 years after the first dose of PPSV23 is recommended for people aged 19-64 years who have chronic kidney failure, nephrotic syndrome, asplenia, or immunocompromised conditions. People who received 1-2 doses of PPSV23 before age 2 years should receive another dose of PPSV23 vaccine at age 24 years or later if at least 5 years have passed since the previous dose. Doses of PPSV23 are not needed for people immunized with PPSV23 at or after age 41 years.  Meningococcal vaccine. Adults with asplenia or persistent complement component deficiencies should receive 2 doses of quadrivalent meningococcal conjugate (MenACWY-D) vaccine. The doses should be obtained at least 2 months apart. Microbiologists working with certain meningococcal bacteria, [employer] recruits, people at risk during an outbreak, and people who travel to or live in countries with a high rate of meningitis should be immunized. A first-year college student up through age 65 years who is living in a residence hall should receive a dose if she did not receive a dose on or after her 16th birthday. Adults who have certain high-risk conditions should receive one or more doses of vaccine.  Hepatitis A vaccine. Adults who wish to be protected from this disease, have certain high-risk conditions, work with hepatitis A-infected animals, work in hepatitis A research labs, or travel to or work in countries with a high rate of hepatitis A should be immunized. Adults who were previously unvaccinated and who anticipate close contact with an international adoptee during the first 60 days after arrival in the [place] from a country with a high rate of hepatitis A should be immunized.  Hepatitis B vaccine. Adults who  wish to be protected from this disease, have certain high-risk conditions, may be exposed to blood or other infectious body fluids, are household contacts or sex partners of hepatitis B positive people, are clients or workers in certain care facilities, or travel to or work in countries with a high rate of hepatitis B should be immunized.  Haemophilus influenzae type b (Hib) vaccine. A previously unvaccinated person with asplenia or sickle cell disease or having a scheduled splenectomy should receive 1 dose of Hib vaccine. Regardless of previous immunization, a recipient of a hematopoietic stem cell transplant should receive a 3-dose series 6-12 months after her successful transplant. Hib vaccine is not recommended for adults with HIV infection. Preventive Services / Frequency Ages 91 years and over  Blood pressure check.** / Every 1 to 2 years.  Lipid and cholesterol check.** / Every 5 years beginning at age 54 years.  Lung cancer screening. / Every year if you are aged 59-80 years and have a 30-pack-year history of smoking and currently smoke or have quit within the past 15 years. Yearly screening is stopped once you have quit smoking for at least 15 years or develop a health problem that would prevent you from having lung cancer treatment.  Clinical breast exam.** / Every year after age 1 years.  BRCA-related cancer risk assessment.** / For women who have family members with a BRCA-related cancer (breast, ovarian, tubal, or peritoneal cancers).  Mammogram.** / Every year beginning at age 51 years and continuing for as long as you are in good health. Consult with your health care provider.  Pap test.** / Every 3  years starting at age 27 years through age 71 or 46 years with 3 consecutive normal Pap tests. Testing can be stopped between 65 and 70 years with 3 consecutive normal Pap tests and no abnormal Pap or HPV tests in the past 10 years.  HPV screening.** / Every 3 years from ages 76 years  through ages 59 or 45 years with a history of 3 consecutive normal Pap tests. Testing can be stopped between 65 and 70 years with 3 consecutive normal Pap tests and no abnormal Pap or HPV tests in the past 10 years.  Fecal occult blood test (FOBT) of stool. / Every year beginning at age 77 years and continuing until age 85 years. You may not need to do this test if you get a colonoscopy every 10 years.  Flexible sigmoidoscopy or colonoscopy.** / Every 5 years for a flexible sigmoidoscopy or every 10 years for a colonoscopy beginning at age 39 years and continuing until age 55 years.  Hepatitis C blood test.** / For all people born from [birth year] through 1964 and any individual with known risks for hepatitis C.  Osteoporosis screening.** / A one-time screening for women ages 38 years and over and women at risk for fractures or osteoporosis.  Skin self-exam. / Monthly.  Influenza vaccine. / Every year.  Tetanus, diphtheria, and acellular pertussis (Tdap/Td) vaccine.** / 1 dose of Td every 10 years.  Varicella vaccine.** / Consult your health care provider.  Zoster vaccine.** / 1 dose for adults aged 47 years or older.  Pneumococcal 13-valent conjugate (PCV13) vaccine.** / Consult your health care provider.  Pneumococcal polysaccharide (PPSV23) vaccine.** / 1 dose for all adults aged 16 years and older.  Meningococcal vaccine.** / Consult your health care provider.  Hepatitis A vaccine.** / Consult your health care provider.  Hepatitis B vaccine.** / Consult your health care provider.  Haemophilus influenzae type b (Hib) vaccine.** / Consult your health care provider. ** Family history and personal history of risk and conditions may change your health care provider's recommendations. Document Released: 05/22/2001 Document Revised: 08/10/2013 Document Reviewed: 08/21/2010 [facility] Patient Information 2015 [place], [place]. This information is not intended to replace advice given to you by your  health care provider. Make sure you discuss any questions you have with your health care provider.

## 2014-03-11 ENCOUNTER — Ambulatory Visit: Payer: Self-pay

## 2014-03-19 ENCOUNTER — Ambulatory Visit (INDEPENDENT_AMBULATORY_CARE_PROVIDER_SITE_OTHER): Payer: Medicare Other | Admitting: Pharmacist

## 2014-03-19 DIAGNOSIS — I480 Paroxysmal atrial fibrillation: Secondary | ICD-10-CM

## 2014-03-19 DIAGNOSIS — Z79899 Other long term (current) drug therapy: Secondary | ICD-10-CM

## 2014-03-19 LAB — POCT INR: INR: 2

## 2014-03-19 MED ORDER — KETOCONAZOLE 2 % EX CREA: TOPICAL_CREAM | Freq: Every day | CUTANEOUS | Status: DC

## 2014-03-19 NOTE — Progress Notes (Signed)
Subjective:     Indication: atrial fibrillation, paroxysmal Bleeding signs/symptoms: None Thromboembolic signs/symptoms: None  Missed Coumadin doses: None Medication changes: yes - patient has stopped zetia due to high cost and has stopped pioglitazone due to concerns with side effects.   HBG readings have been stable and not greatly elevated - 120, 135, 142, 149 Dietary changes: no Bacterial/viral infection: no Other concerns: no  The following portions of the patient's history were reviewed and updated as appropriate: allergies, current medications, past family history, past medical history, past social history, past surgical history and problem list.   Objective:    INR Today: 2.0 Current dose: warfarin 5mg  1/2 tablet on sundays and wednesdays and 1 tablet all other days.     Assessment:    Therapeutic INR for goal of 2-3   Medication Management  Plan:    1. New dose: no change   2. Next INR: 1 month   3.  Ok to continue off pioglitazone and Zetia.  Due to recheck labs in 2 months.  Cherre Robins, PharmD, CPP

## 2014-03-19 NOTE — Patient Instructions (Signed)
Anticoagulation Dose Instructions as of 03/19/2014      Rhonda Garcia Tue Wed Thu Fri Sat   New Dose 2.5 mg 5 mg 5 mg 2.5 mg 5 mg 5 mg 5 mg    Description        Continue current warfarin dose of 1/2 tablet on sundays and wednesdays and 1 tablet all other days.      INR was 2.0 today   Use colace / docusate sodium to soften stools / bowel movements when taking hydrocodone / pain medication regularly.

## 2014-04-13 ENCOUNTER — Telehealth: Payer: Self-pay | Admitting: Family

## 2014-04-15 NOTE — Telephone Encounter (Signed)
Stp she wants to see Alyse Low regarding stomach issues, she has a hernia but is unsure if her problem is related to the hernia or not. Pt given appt with Evelina Dun 1/20 at 8:50

## 2014-04-19 ENCOUNTER — Encounter: Payer: Self-pay | Admitting: Family

## 2014-04-28 ENCOUNTER — Ambulatory Visit (INDEPENDENT_AMBULATORY_CARE_PROVIDER_SITE_OTHER): Payer: Medicare Other

## 2014-04-28 ENCOUNTER — Ambulatory Visit (INDEPENDENT_AMBULATORY_CARE_PROVIDER_SITE_OTHER): Payer: Medicare Other | Admitting: Family

## 2014-04-28 ENCOUNTER — Encounter: Payer: Self-pay | Admitting: Family

## 2014-04-28 ENCOUNTER — Other Ambulatory Visit: Payer: Self-pay | Admitting: Family

## 2014-04-28 ENCOUNTER — Other Ambulatory Visit: Payer: Self-pay | Admitting: Family Medicine

## 2014-04-28 VITALS — BP 144/72 | HR 61 | Temp 97.6°F | Ht 62.0 in | Wt 184.6 lb

## 2014-04-28 DIAGNOSIS — Z1211 Encounter for screening for malignant neoplasm of colon: Secondary | ICD-10-CM

## 2014-04-28 DIAGNOSIS — E875 Hyperkalemia: Secondary | ICD-10-CM

## 2014-04-28 DIAGNOSIS — M19031 Primary osteoarthritis, right wrist: Secondary | ICD-10-CM

## 2014-04-28 DIAGNOSIS — R1011 Right upper quadrant pain: Secondary | ICD-10-CM

## 2014-04-28 DIAGNOSIS — I480 Paroxysmal atrial fibrillation: Secondary | ICD-10-CM

## 2014-04-28 LAB — POCT HEMOGLOBIN: Hemoglobin: 13 g/dL (ref 12.2–16.2)

## 2014-04-28 LAB — POCT INR: INR: 6.1

## 2014-04-28 MED ORDER — HYDROCODONE-ACETAMINOPHEN 5-325 MG PO TABS: 1.0000 | ORAL_TABLET | Freq: Four times a day (QID) | ORAL | Status: DC | PRN

## 2014-04-28 NOTE — Addendum Note (Signed)
Addended by: Pollyann Kennedy F on: 04/28/2014 10:45 AM   Modules accepted: Orders

## 2014-04-28 NOTE — Progress Notes (Signed)
   Subjective:    Patient ID: Rhonda Garcia, female    DOB: May 09, 1940, 74 y.o.   MRN: [id number]  Abdominal Pain This is a chronic problem. The current episode started more than 1 year ago. The onset quality is gradual. The problem occurs intermittently. The pain is located in the RUQ. The pain is at a severity of 8/10. The pain is moderate. The quality of the pain is cramping. The abdominal pain does not radiate. Associated symptoms include arthralgias and constipation. Pertinent negatives include no belching, diarrhea, fever, frequency, headaches, myalgias, nausea or vomiting. The pain is aggravated by certain positions. The treatment provided no relief. Her past medical history is significant for GERD.   *Pt states she had been diagnosed with a hital hernia about 10 years   Review of Systems  Constitutional: Negative.  Negative for fever.  HENT: Negative.   Eyes: Negative.   Respiratory: Negative.  Negative for shortness of breath.   Cardiovascular: Negative.  Negative for palpitations.  Gastrointestinal: Positive for abdominal pain and constipation. Negative for nausea, vomiting and diarrhea.  Endocrine: Negative.   Genitourinary: Negative.  Negative for frequency.  Musculoskeletal: Positive for back pain and arthralgias. Negative for myalgias.  Neurological: Negative.  Negative for headaches.  Hematological: Negative.   Psychiatric/Behavioral: Negative.   All other systems reviewed and are negative.      Objective:   Physical Exam  Constitutional: She is oriented to person, place, and time. She appears well-developed and well-nourished. No distress.  Cardiovascular: Normal rate, regular rhythm, normal heart sounds and intact distal pulses.   No murmur heard. Pulmonary/Chest: Effort normal and breath sounds normal. No respiratory distress. She has no wheezes.  Abdominal: Soft. Bowel sounds are normal. She exhibits no distension. There is no tenderness.  Musculoskeletal: Normal  range of motion. She exhibits no edema or tenderness.  Neurological: She is alert and oriented to person, place, and time.  Skin: Skin is warm and dry.  Psychiatric: She has a normal mood and affect. Her behavior is normal. Judgment and thought content normal.  Vitals reviewed.   BP 144/72 mmHg  Pulse 61  Temp(Src) 97.6 F (36.4 C) (Oral)  Ht 5\' 2"  (1.575 m)  Wt 184 lb 9.6 oz (83.734 kg)  BMI 33.76 kg/m2  KUB- WNL Preliminary reading by Evelina Dun, FNP [facility]      Assessment & Plan:  1. Paroxysmal atrial fibrillation -Pt's warfarin is being held and recheck on Friday -Falls precaution discussed -S/S of bleeding discussed - POCT INR - POCT hemoglobin  Anticoagulation Dose Instructions as of 04/28/2014      Dorene Grebe Tue Wed Thu Fri Sat   New Dose 2.5 mg 5 mg 2.5 mg 5 mg 2.5 mg 5 mg 2.5 mg    Description        No warfarin for next 2 days - Wednesday, January 20th or Thursday, January 21st.  Return to office to have protime checked Friday, January 22nd.  In case office is closed Friday due to inclement weather - start to following new dose - 1 tablet of warfarin 5mg  on mondays, wednesdays and fridays and 1/2 tablet all other days.         2. RUQ pain -Encourage healthy diet and exercise -Tylenol prn for pain -RTO prn - DG Abd 1 View; Future - Ambulatory referral to Gastroenterology  3. Encounter for screening colonoscopy - Ambulatory referral to Gastroenterology  Evelina Dun, FNP

## 2014-04-28 NOTE — Patient Instructions (Addendum)
Abdominal Pain Many things can cause abdominal pain. Usually, abdominal pain is not caused by a disease and will improve without treatment. It can often be observed and treated at home. Your health care provider will do a physical exam and possibly order blood tests and X-rays to help determine the seriousness of your pain. However, in many cases, more time must pass before a clear cause of the pain can be found. Before that point, your health care provider may not know if you need more testing or further treatment. HOME CARE INSTRUCTIONS  Monitor your abdominal pain for any changes. The following actions may help to alleviate any discomfort you are experiencing:  Only take over-the-counter or prescription medicines as directed by your health care provider.  Do not take laxatives unless directed to do so by your health care provider.  Try a clear liquid diet (broth, tea, or water) as directed by your health care provider. Slowly move to a bland diet as tolerated. SEEK MEDICAL CARE IF:  You have unexplained abdominal pain.  You have abdominal pain associated with nausea or diarrhea.  You have pain when you urinate or have a bowel movement.  You experience abdominal pain that wakes you in the night.  You have abdominal pain that is worsened or improved by eating food.  You have abdominal pain that is worsened with eating fatty foods.  You have a fever. SEEK IMMEDIATE MEDICAL CARE IF:   Your pain does not go away within 2 hours.  You keep throwing up (vomiting).  Your pain is felt only in portions of the abdomen, such as the right side or the left lower portion of the abdomen.  You pass bloody or black tarry stools. MAKE SURE YOU:  Understand these instructions.   Will watch your condition.   Will get help right away if you are not doing well or get worse.  Document Released: 01/03/2005 Document Revised: 03/31/2013 Document Reviewed: 12/03/2012 [facility] Patient Information  2015 [place], [place]. This information is not intended to replace advice given to you by your health care provider. Make sure you discuss any questions you have with your health care provider.  Anticoagulation Dose Instructions as of 04/28/2014      Dorene Grebe Tue Wed Thu Fri Sat   New Dose 2.5 mg 5 mg 2.5 mg 5 mg 2.5 mg 5 mg 2.5 mg    Description        No warfarin for next 2 days - Wednesday, January 20th or Thursday, January 21st.  Return to office to have protime checked Friday, January 22nd.  In case office is closed Friday due to inclement weather - start to following new dose - 1 tablet of warfarin 5mg  on mondays, wednesdays and fridays and 1/2 tablet all other days.

## 2014-04-29 LAB — BMP8+EGFR
BUN/Creatinine Ratio: 20 (ref 11–26)
BUN: 28 mg/dL — ABNORMAL HIGH (ref 8–27)
CO2: 26 mmol/L (ref 18–29)
Calcium: 9.7 mg/dL (ref 8.7–10.3)
Chloride: 101 mmol/L (ref 97–108)
Creatinine, Ser: 1.42 mg/dL — ABNORMAL HIGH (ref 0.57–1.00)
GFR calc Af Amer: 43 mL/min/{1.73_m2} — ABNORMAL LOW (ref >59)
GFR calc non Af Amer: 37 mL/min/{1.73_m2} — ABNORMAL LOW (ref >59)
Glucose: 120 mg/dL — ABNORMAL HIGH (ref 65–99)
Potassium: 4 mmol/L (ref 3.5–5.2)
Sodium: 139 mmol/L (ref 134–144)

## 2014-04-30 ENCOUNTER — Ambulatory Visit: Payer: Self-pay

## 2014-05-03 ENCOUNTER — Ambulatory Visit: Payer: Self-pay

## 2014-05-06 ENCOUNTER — Ambulatory Visit (INDEPENDENT_AMBULATORY_CARE_PROVIDER_SITE_OTHER): Payer: Medicare Other | Admitting: Pharmacist

## 2014-05-06 ENCOUNTER — Telehealth: Payer: Self-pay

## 2014-05-06 DIAGNOSIS — I480 Paroxysmal atrial fibrillation: Secondary | ICD-10-CM

## 2014-05-06 LAB — POCT INR: INR: 1.8

## 2014-05-06 NOTE — Patient Instructions (Signed)
Anticoagulation Dose Instructions as of 05/06/2014      Rhonda Garcia Tue Wed Thu Fri Sat   New Dose 2.5 mg 5 mg 2.5 mg 5 mg 2.5 mg 5 mg 2.5 mg    Description        Take one whole tablet today - Thursday, January 28th, then resume warfarin 5mg  1 tablet on mondays, wednesdays and fridays.  Take 1/2 tablet all other days.       INR was 1.8 today

## 2014-05-06 NOTE — Telephone Encounter (Signed)
Pt was referred by Evelina Dun, NP for colonoscopy. According to our records, her last one was 03/21/2011 and her next was recommended in 5 years ( Dr. Gala Romney).  She has a family hx of colon cancer and also she has hx of adenomatous polyps.   She is on our recall for 03/2016.   Pt said she is having incontinence with BM's at times and sometimes she has constipation.   Also, complains with a "catch in her stomach" sometimes and it is hard to go away.   She is scheduled for an OV with Neil Crouch, PA on 05/26/2014, at 2:00 PM.

## 2014-05-06 NOTE — Telephone Encounter (Signed)
Pt received a letter from DS and was calling to set up procedure. Please call her at [phone]

## 2014-05-20 ENCOUNTER — Ambulatory Visit (INDEPENDENT_AMBULATORY_CARE_PROVIDER_SITE_OTHER): Payer: Medicare Other | Admitting: Family

## 2014-05-20 ENCOUNTER — Encounter: Payer: Self-pay | Admitting: Family

## 2014-05-20 VITALS — BP 125/75 | HR 84 | Temp 97.2°F | Ht 62.0 in | Wt 190.6 lb

## 2014-05-20 DIAGNOSIS — K219 Gastro-esophageal reflux disease without esophagitis: Secondary | ICD-10-CM

## 2014-05-20 DIAGNOSIS — E119 Type 2 diabetes mellitus without complications: Secondary | ICD-10-CM

## 2014-05-20 DIAGNOSIS — I480 Paroxysmal atrial fibrillation: Secondary | ICD-10-CM

## 2014-05-20 DIAGNOSIS — E785 Hyperlipidemia, unspecified: Secondary | ICD-10-CM

## 2014-05-20 DIAGNOSIS — F329 Major depressive disorder, single episode, unspecified: Secondary | ICD-10-CM

## 2014-05-20 DIAGNOSIS — M1990 Unspecified osteoarthritis, unspecified site: Secondary | ICD-10-CM

## 2014-05-20 DIAGNOSIS — Z23 Encounter for immunization: Secondary | ICD-10-CM

## 2014-05-20 DIAGNOSIS — F32A Depression, unspecified: Secondary | ICD-10-CM

## 2014-05-20 DIAGNOSIS — I10 Essential (primary) hypertension: Secondary | ICD-10-CM

## 2014-05-20 LAB — POCT GLYCOSYLATED HEMOGLOBIN (HGB A1C): Hemoglobin A1C: 6.6

## 2014-05-20 LAB — POCT INR: INR: 1.6

## 2014-05-20 NOTE — Progress Notes (Signed)
Subjective:    Patient ID: Rhonda Garcia, female    DOB: 09/11/1940, 74 y.o.   MRN: [id number]  Diabetes She presents for her follow-up diabetic visit. She has type 2 diabetes mellitus. Her disease course has been fluctuating. Hypoglycemia symptoms include headaches and nervousness/anxiousness. Pertinent negatives for hypoglycemia include no confusion or dizziness. Associated symptoms include visual change. Pertinent negatives for diabetes include no chest pain, no fatigue, no foot paresthesias and no foot ulcerations. There are no hypoglycemic complications. Symptoms are stable. Diabetic complications include heart disease. Pertinent negatives for diabetic complications include no CVA, nephropathy or peripheral neuropathy. Risk factors for coronary artery disease include dyslipidemia, family history, hypertension, post-menopausal, stress and diabetes mellitus. Current diabetic treatment includes oral agent (dual therapy). She is compliant with treatment all of the time. She is following a generally unhealthy diet. She participates in exercise weekly. (Pt states she doesn't check her BS ) An ACE inhibitor/angiotensin II receptor blocker is being taken. Eye exam is current.  Hypertension This is a chronic problem. The current episode started more than 1 year ago. The problem has been waxing and waning since onset. The problem is uncontrolled. Associated symptoms include anxiety and headaches. Pertinent negatives include no chest pain, palpitations, peripheral edema or shortness of breath. Risk factors for coronary artery disease include diabetes mellitus, dyslipidemia, family history, obesity and post-menopausal state. Past treatments include ACE inhibitors, diuretics, calcium channel blockers and beta blockers. The current treatment provides moderate improvement. Hypertensive end-organ damage includes CAD/MI. There is no history of kidney disease, CVA, heart failure or a thyroid problem. Identifiable  causes of hypertension include sleep apnea.  Hyperlipidemia This is a chronic problem. The current episode started more than 1 year ago. The problem is uncontrolled. Recent lipid tests were reviewed and are high. Exacerbating diseases include diabetes. She has no history of hypothyroidism. Pertinent negatives include no chest pain, leg pain, myalgias or shortness of breath. Current antihyperlipidemic treatment includes statins. The current treatment provides moderate improvement of lipids. Risk factors for coronary artery disease include diabetes mellitus, dyslipidemia, family history, hypertension, obesity, post-menopausal and a sedentary lifestyle.  Gastrophageal Reflux She reports no chest pain, no nausea or no sore throat. This is a chronic problem. The current episode started more than 1 year ago. The problem occurs rarely. The problem has been resolved. The symptoms are aggravated by certain foods. Pertinent negatives include no fatigue. She has tried a PPI and a diet change for the symptoms. The treatment provided significant relief.  Anxiety Presents for follow-up visit. Symptoms include excessive worry and nervous/anxious behavior. Patient reports no chest pain, confusion, dizziness, insomnia, irritability, nausea, palpitations or shortness of breath. Symptoms occur occasionally. The severity of symptoms is mild.   Her past medical history is significant for anxiety/panic attacks and depression. There is no history of CAD. Past treatments include SSRIs.  Constipation This is a chronic problem. The current episode started more than 1 year ago. The problem is unchanged. Her stool frequency is 4 to 5 times per week. The stool is described as firm. Associated symptoms include bloating. Pertinent negatives include no fever, hematochezia or nausea. The treatment provided mild relief.      Review of Systems  Constitutional: Negative.  Negative for fever, irritability and fatigue.  HENT: Negative  for sore throat.   Eyes: Negative.   Respiratory: Negative.  Negative for shortness of breath.   Cardiovascular: Negative.  Negative for chest pain and palpitations.  Gastrointestinal: Positive for constipation and bloating. Negative for  nausea and hematochezia.  Endocrine: Negative.   Genitourinary: Negative.   Musculoskeletal: Negative.  Negative for myalgias.  Neurological: Positive for headaches. Negative for dizziness.  Hematological: Negative.   Psychiatric/Behavioral: Negative for confusion. The patient is nervous/anxious. The patient does not have insomnia.   All other systems reviewed and are negative.      Objective:   Physical Exam  Constitutional: She is oriented to person, place, and time. She appears well-developed and well-nourished. No distress.  HENT:  Head: Normocephalic and atraumatic.  Right Ear: External ear normal.  Mouth/Throat: Oropharynx is clear and moist.  Eyes: Pupils are equal, round, and reactive to light.  Neck: Normal range of motion. Neck supple. No thyromegaly present.  Cardiovascular: Normal rate, regular rhythm, normal heart sounds and intact distal pulses.   No murmur heard. Pulmonary/Chest: Effort normal and breath sounds normal. No respiratory distress. She has no wheezes.  Abdominal: Soft. Bowel sounds are normal. She exhibits no distension. There is no tenderness.  Musculoskeletal: Normal range of motion. She exhibits no edema or tenderness.  Neurological: She is alert and oriented to person, place, and time. She has normal reflexes. No cranial nerve deficit.  Skin: Skin is warm and dry.  Psychiatric: She has a normal mood and affect. Her behavior is normal. Judgment and thought content normal.  Vitals reviewed.     BP 125/75 mmHg  Pulse 84  Temp(Src) 97.2 F (36.2 C) (Oral)  Ht 5' 2"  (1.575 m)  Wt 190 lb 9.6 oz (86.456 kg)  BMI 34.85 kg/m2     Assessment & Plan:  1. Diabetes mellitus type 2, uncomplicated - POCT glycosylated  hemoglobin (Hb A1C)  2. Hyperlipidemia - Hepatic function panel  3. Essential hypertension - BMP8+EGFR  4. Paroxysmal atrial fibrillation - POCT INR  5. Gastroesophageal reflux disease without esophagitis  6. Osteoarthritis, unspecified osteoarthritis type, unspecified site  7. Depression   Continue all meds Labs pending Health Maintenance reviewed Diet and exercise encouraged RTO 4 months   Evelina Dun, FNP

## 2014-05-20 NOTE — Patient Instructions (Addendum)
Anticoagulation Dose Instructions as of 05/20/2014      Dorene Grebe Tue Wed Thu Fri Sat   New Dose 5 mg 5 mg 2.5 mg 5 mg 2.5 mg 5 mg 2.5 mg    Description        Take one whole tablet today - Thursday, February 11th  And 1 and 1/2 tablets tomorrow Friday, February 12th, then increase warfarin $RemoveBeforeDEI'5mg'[id number]$  1 tablet on sundays, mondays, wednesdays and fridays.  Take 1/2 tablet tuesdays, thursdays and saturdays.       INR was 1.6 today (too thick)  Health Maintenance Adopting a healthy lifestyle and getting preventive care can go a long way to promote health and wellness. Talk with your health care provider about what schedule of regular examinations is right for you. This is a good chance for you to check in with your provider about disease prevention and staying healthy. In between checkups, there are plenty of things you can do on your own. Experts have done a lot of research about which lifestyle changes and preventive measures are most likely to keep you healthy. Ask your health care provider for more information. WEIGHT AND DIET  Eat a healthy diet  Be sure to include plenty of vegetables, fruits, low-fat dairy products, and lean protein.  Do not eat a lot of foods high in solid fats, added sugars, or salt.  Get regular exercise. This is one of the most important things you can do for your health.  Most adults should exercise for at least 150 minutes each week. The exercise should increase your heart rate and make you sweat (moderate-intensity exercise).  Most adults should also do strengthening exercises at least twice a week. This is in addition to the moderate-intensity exercise.  Maintain a healthy weight  Body mass index (BMI) is a measurement that can be used to identify possible weight problems. It estimates body fat based on height and weight. Your health care provider can help determine your BMI and help you achieve or maintain a healthy weight.  For females 79 years of age and older:    A BMI below 18.5 is considered underweight.  A BMI of 18.5 to 24.9 is normal.  A BMI of 25 to 29.9 is considered overweight.  A BMI of 30 and above is considered obese.  Watch levels of cholesterol and blood lipids  You should start having your blood tested for lipids and cholesterol at 73 years of age, then have this test every 5 years.  You may need to have your cholesterol levels checked more often if:  Your lipid or cholesterol levels are high.  You are older than 73 years of age.  You are at high risk for heart disease.  CANCER SCREENING   Lung Cancer  Lung cancer screening is recommended for adults 57-60 years old who are at high risk for lung cancer because of a history of smoking.  A yearly low-dose CT scan of the lungs is recommended for people who:  Currently smoke.  Have quit within the past 15 years.  Have at least a 30-pack-year history of smoking. A pack year is smoking an average of one pack of cigarettes a day for 1 year.  Yearly screening should continue until it has been 15 years since you quit.  Yearly screening should stop if you develop a health problem that would prevent you from having lung cancer treatment.  Breast Cancer  Practice breast self-awareness. This means understanding how your breasts normally appear  and feel.  It also means doing regular breast self-exams. Let your health care provider know about any changes, no matter how small.  If you are in your 20s or 30s, you should have a clinical breast exam (CBE) by a health care provider every 1-3 years as part of a regular health exam.  If you are 72 or older, have a CBE every year. Also consider having a breast X-ray (mammogram) every year.  If you have a family history of breast cancer, talk to your health care provider about genetic screening.  If you are at high risk for breast cancer, talk to your health care provider about having an MRI and a mammogram every year.  Breast  cancer gene (BRCA) assessment is recommended for women who have family members with BRCA-related cancers. BRCA-related cancers include:  Breast.  Ovarian.  Tubal.  Peritoneal cancers.  Results of the assessment will determine the need for genetic counseling and BRCA1 and BRCA2 testing. Cervical Cancer Routine pelvic examinations to screen for cervical cancer are no longer recommended for nonpregnant women who are considered low risk for cancer of the pelvic organs (ovaries, uterus, and vagina) and who do not have symptoms. A pelvic examination may be necessary if you have symptoms including those associated with pelvic infections. Ask your health care provider if a screening pelvic exam is right for you.   The Pap test is the screening test for cervical cancer for women who are considered at risk.  If you had a hysterectomy for a problem that was not cancer or a condition that could lead to cancer, then you no longer need Pap tests.  If you are older than 65 years, and you have had normal Pap tests for the past 10 years, you no longer need to have Pap tests.  If you have had past treatment for cervical cancer or a condition that could lead to cancer, you need Pap tests and screening for cancer for at least 20 years after your treatment.  If you no longer get a Pap test, assess your risk factors if they change (such as having a new sexual partner). This can affect whether you should start being screened again.  Some women have medical problems that increase their chance of getting cervical cancer. If this is the case for you, your health care provider may recommend more frequent screening and Pap tests.  The human papillomavirus (HPV) test is another test that may be used for cervical cancer screening. The HPV test looks for the virus that can cause cell changes in the cervix. The cells collected during the Pap test can be tested for HPV.  The HPV test can be used to screen women 33 years  of age and older. Getting tested for HPV can extend the interval between normal Pap tests from three to five years.  An HPV test also should be used to screen women of any age who have unclear Pap test results.  After 73 years of age, women should have HPV testing as often as Pap tests.  Colorectal Cancer  This type of cancer can be detected and often prevented.  Routine colorectal cancer screening usually begins at 73 years of age and continues through 73 years of age.  Your health care provider may recommend screening at an earlier age if you have risk factors for colon cancer.  Your health care provider may also recommend using home test kits to check for hidden blood in the stool.  A small  camera at the end of a tube can be used to examine your colon directly (sigmoidoscopy or colonoscopy). This is done to check for the earliest forms of colorectal cancer.  Routine screening usually begins at age 82.  Direct examination of the colon should be repeated every 5-10 years through 73 years of age. However, you may need to be screened more often if early forms of precancerous polyps or small growths are found. Skin Cancer  Check your skin from head to toe regularly.  Tell your health care provider about any new moles or changes in moles, especially if there is a change in a mole's shape or color.  Also tell your health care provider if you have a mole that is larger than the size of a pencil eraser.  Always use sunscreen. Apply sunscreen liberally and repeatedly throughout the day.  Protect yourself by wearing long sleeves, pants, a wide-brimmed hat, and sunglasses whenever you are outside. HEART DISEASE, DIABETES, AND HIGH BLOOD PRESSURE   Have your blood pressure checked at least every 1-2 years. High blood pressure causes heart disease and increases the risk of stroke.  If you are between 37 years and 14 years old, ask your health care provider if you should take aspirin to  prevent strokes.  Have regular diabetes screenings. This involves taking a blood sample to check your fasting blood sugar level.  If you are at a normal weight and have a low risk for diabetes, have this test once every three years after 73 years of age.  If you are overweight and have a high risk for diabetes, consider being tested at a younger age or more often. PREVENTING INFECTION  Hepatitis B  If you have a higher risk for hepatitis B, you should be screened for this virus. You are considered at high risk for hepatitis B if:  You were born in a country where hepatitis B is common. Ask your health care provider which countries are considered high risk.  Your parents were born in a high-risk country, and you have not been immunized against hepatitis B (hepatitis B vaccine).  You have HIV or AIDS.  You use needles to inject street drugs.  You live with someone who has hepatitis B.  You have had sex with someone who has hepatitis B.  You get hemodialysis treatment.  You take certain medicines for conditions, including cancer, organ transplantation, and autoimmune conditions. Hepatitis C  Blood testing is recommended for:  Everyone born from [birth year] through 1964.  Anyone with known risk factors for hepatitis C. Sexually transmitted infections (STIs)  You should be screened for sexually transmitted infections (STIs) including gonorrhea and chlamydia if:  You are sexually active and are younger than 73 years of age.  You are older than 73 years of age and your health care provider tells you that you are at risk for this type of infection.  Your sexual activity has changed since you were last screened and you are at an increased risk for chlamydia or gonorrhea. Ask your health care provider if you are at risk.  If you do not have HIV, but are at risk, it may be recommended that you take a prescription medicine daily to prevent HIV infection. This is called pre-exposure  prophylaxis (PrEP). You are considered at risk if:  You are sexually active and do not regularly use condoms or know the HIV status of your partner(s).  You take drugs by injection.  You are sexually active with a  partner who has HIV. Talk with your health care provider about whether you are at high risk of being infected with HIV. If you choose to begin PrEP, you should first be tested for HIV. You should then be tested every 3 months for as long as you are taking PrEP.  PREGNANCY   If you are premenopausal and you may become pregnant, ask your health care provider about preconception counseling.  If you may become pregnant, take 400 to 800 micrograms (mcg) of folic acid every day.  If you want to prevent pregnancy, talk to your health care provider about birth control (contraception). OSTEOPOROSIS AND MENOPAUSE   Osteoporosis is a disease in which the bones lose minerals and strength with aging. This can result in serious bone fractures. Your risk for osteoporosis can be identified using a bone density scan.  If you are 64 years of age or older, or if you are at risk for osteoporosis and fractures, ask your health care provider if you should be screened.  Ask your health care provider whether you should take a calcium or vitamin D supplement to lower your risk for osteoporosis.  Menopause may have certain physical symptoms and risks.  Hormone replacement therapy may reduce some of these symptoms and risks. Talk to your health care provider about whether hormone replacement therapy is right for you.  HOME CARE INSTRUCTIONS   Schedule regular health, dental, and eye exams.  Stay current with your immunizations.   Do not use any tobacco products including cigarettes, chewing tobacco, or electronic cigarettes.  If you are pregnant, do not drink alcohol.  If you are breastfeeding, limit how much and how often you drink alcohol.  Limit alcohol intake to no more than 1 drink per  day for nonpregnant women. One drink equals 12 ounces of beer, 5 ounces of wine, or 1 ounces of hard liquor.  Do not use street drugs.  Do not share needles.  Ask your health care provider for help if you need support or information about quitting drugs.  Tell your health care provider if you often feel depressed.  Tell your health care provider if you have ever been abused or do not feel safe at home. Document Released: 10/09/2010 Document Revised: 08/10/2013 Document Reviewed: 02/25/2013 [facility] Patient Information 2015 [place], [place]. This information is not intended to replace advice given to you by your health care provider. Make sure you discuss any questions you have with your health care provider.

## 2014-05-21 LAB — BMP8+EGFR
BUN/Creatinine Ratio: 17 (ref 11–26)
BUN: 15 mg/dL (ref 8–27)
CO2: 26 mmol/L (ref 18–29)
Calcium: 9.8 mg/dL (ref 8.7–10.3)
Chloride: 102 mmol/L (ref 97–108)
Creatinine, Ser: 0.87 mg/dL (ref 0.57–1.00)
GFR calc Af Amer: 77 mL/min/{1.73_m2} (ref >59)
GFR calc non Af Amer: 67 mL/min/{1.73_m2} (ref >59)
Glucose: 142 mg/dL — ABNORMAL HIGH (ref 65–99)
Potassium: 4.3 mmol/L (ref 3.5–5.2)
Sodium: 144 mmol/L (ref 134–144)

## 2014-05-21 LAB — HEPATIC FUNCTION PANEL
ALT: 19 IU/L (ref 0–32)
AST: 19 IU/L (ref 0–40)
Albumin: 3.9 g/dL (ref 3.5–4.8)
Alkaline Phosphatase: 75 IU/L (ref 39–117)
Bilirubin Total: 0.6 mg/dL (ref 0.0–1.2)
Bilirubin, Direct: 0.2 mg/dL (ref 0.00–0.40)
Total Protein: 6.1 g/dL (ref 6.0–8.5)

## 2014-05-26 ENCOUNTER — Encounter: Payer: Self-pay | Admitting: Gastroenterology

## 2014-05-26 ENCOUNTER — Ambulatory Visit (INDEPENDENT_AMBULATORY_CARE_PROVIDER_SITE_OTHER): Payer: Medicare Other | Admitting: Gastroenterology

## 2014-05-26 VITALS — BP 141/69 | HR 65 | Temp 97.2°F | Ht 62.0 in | Wt 190.6 lb

## 2014-05-26 DIAGNOSIS — K219 Gastro-esophageal reflux disease without esophagitis: Secondary | ICD-10-CM

## 2014-05-26 DIAGNOSIS — R1319 Other dysphagia: Secondary | ICD-10-CM

## 2014-05-26 DIAGNOSIS — K625 Hemorrhage of anus and rectum: Secondary | ICD-10-CM

## 2014-05-26 DIAGNOSIS — R1314 Dysphagia, pharyngoesophageal phase: Secondary | ICD-10-CM

## 2014-05-26 DIAGNOSIS — R1310 Dysphagia, unspecified: Secondary | ICD-10-CM

## 2014-05-26 DIAGNOSIS — K5900 Constipation, unspecified: Secondary | ICD-10-CM

## 2014-05-26 DIAGNOSIS — R1011 Right upper quadrant pain: Secondary | ICD-10-CM

## 2014-05-26 MED ORDER — POLYETHYLENE GLYCOL 3350 17 GM/SCOOP PO POWD: ORAL | Status: DC

## 2014-05-26 NOTE — Progress Notes (Signed)
Primary Care Physician:  Sharion Balloon, FNP  Primary Gastroenterologist:  Garfield Cornea, MD   Chief Complaint  Patient presents with  . set up TCS    HPI:  Rhonda Garcia is a 74 y.o. female here for further evaluation of bowel issues, ruq pain. Last seen in 2012 at which time she was having a lot of problems with weight loss, abnormal distal ileal thickening, reflux, dysphagia, constipation. Workup at that time included an EGD and colonoscopy, she had a normal-appearing esophagus status post [facility] dilator, small hiatal hernia, trivial antral and bulbar erosions. Ileocolonoscopy revealed colonic diverticulosis, colon tubular adenomas. Is notable back in 2009 she had a colonoscopy for diarrhea with random colon biopsies showing nonspecific cryptitis.  Patient admits that her bowel function fluctuates from constipation to diarrhea chronically. Tends to be more constipation predominant. At times will have fecal urgency associated with incontinence. Feels like all of her bowel movements moved to her rectum and sit. Then she has a difficulty passing on. Sees bright red blood per rectum. Denies melena. Takes Dulcolax a couple times per month. Has not used MiraLAX in several months. Previously was on Amitiza but was too expensive, this was a couple years ago.  Over the past one year, she feels discomfort in the right upper quadrant which she describes as severe cramp that lasts for minutes at a time. Used to noted when she bent over to tie her shoes. Now it happens any time. Denies any association with food. Seems to be unrelated to bowel function. Complains of heartburn but reports is better. Complains of dysphagia to meats, peels, bread. States she recently lost 18 pounds in 18 days on some type of diet pill. This was about 1 month ago. She has gained 5-8 pounds back.    Current Outpatient Prescriptions  Medication Sig Dispense Refill  . Alcohol Swabs 70 % PADS by Does not apply route 2 (two) times  daily.      Marland Kitchen amLODipine (NORVASC) 5 MG tablet Take 1 tablet (5 mg total) by mouth daily. 90 tablet 3  . Blood Glucose Monitoring Suppl (GLUCOCOM MONITOR) W/DEVICE KIT by Does not apply route 2 (two) times daily.      Marland Kitchen glucose blood test strip 1 each by Other route 2 (two) times daily. Use as instructed     . HYDROcodone-acetaminophen (LORCET) 5-325 MG per tablet Take 1 tablet by mouth every 6 (six) hours as needed. 60 tablet 0  . isosorbide mononitrate (IMDUR) 30 MG 24 hr tablet Take 1 tablet (30 mg total) by mouth daily. 90 tablet 3  . ketoconazole (NIZORAL) 2 % cream APPLY ONCE DAILY 60 g 2  . Lancets MISC by Does not apply route 2 (two) times daily.      Marland Kitchen lisinopril-hydrochlorothiazide (PRINZIDE,ZESTORETIC) 20-12.5 MG per tablet Take 2 tablets by mouth daily. 180 tablet 6  . LORazepam (ATIVAN) 0.5 MG tablet TAKE ONE TABLET EVERY 12 HOURS 60 tablet 0  . metFORMIN (GLUCOPHAGE) 500 MG tablet Take 2 tablets (1,000 mg total) by mouth daily with breakfast. 180 tablet 3  . metoprolol (LOPRESSOR) 50 MG tablet Take 1 tablet (50 mg total) by mouth 2 (two) times daily. 180 tablet 3  . mirabegron ER (MYRBETRIQ) 25 MG TB24 tablet Take 1 tablet (25 mg total) by mouth daily. 90 tablet 3  . mupirocin ointment (BACTROBAN) 2 % APPLY INTO THE NOSE TWICE DAILY 22 g 2  . nitroGLYCERIN (NITROSTAT) 0.4 MG SL tablet Place 0.4 mg under the tongue  every 5 (five) minutes as needed.      . NON FORMULARY Take 1,500 mg by mouth every other day. Lipozene    . pantoprazole (PROTONIX) 40 MG tablet Take 1 tablet (40 mg total) by mouth daily. 30 tablet 5  . pravastatin (PRAVACHOL) 80 MG tablet Take 1 tablet (80 mg total) by mouth daily. 90 tablet 3  . ranitidine (ZANTAC) 150 MG tablet Take 1 tablet (150 mg total) by mouth 2 (two) times daily. 180 tablet 3  . sertraline (ZOLOFT) 50 MG tablet Take 1 tablet (50 mg total) by mouth daily. 90 tablet 3  . Vitamin D, Ergocalciferol, (DRISDOL) 50000 UNITS CAPS capsule Take 1 capsule  (50,000 Units total) by mouth every 7 (seven) days. 30 capsule 6  . warfarin (COUMADIN) 5 MG tablet Take 1 tablet (5 mg total) by mouth daily. 90 tablet 1   No current facility-administered medications for this visit.    Allergies as of 05/26/2014 - Review Complete 05/26/2014  Allergen Reaction Noted  . Promethazine hcl Anaphylaxis 09/06/2010  . Penicillins  03/13/2011  . Promethazine hcl Other (See Comments)     Past Medical History  Diagnosis Date  . Diabetes mellitus   . CAD (coronary artery disease)   . MI (myocardial infarction)   . GERD (gastroesophageal reflux disease)   . Osteopenia   . Hypertension   . Hyperlipidemia   . Anal fissure     resolved   . Chronic pain   . Osteoarthritis   . DDD (degenerative disc disease)   . Anemia   . Airway compromise     Inability to tolerate continuous positive airway pressure  . Hiatal hernia   . Dyslipidemia   . Sleep apnea   . Osteoporosis   . Thyroid disease   . Hearing loss   . Generalized headaches   . Sebaceous cyst     on back  . Back pain     with left radiculopathy  . Hx of myocardial infarction   . Depression   . Hx of peptic ulcer   . A-fib     Past Surgical History  Procedure Laterality Date  . Knee surgery    . Appendectomy    . Breast reduction surgery    . Esophagogastroduodenoscopy  06/2007    Small hiatal hernia  . Colonoscopy  06/2007    Friable anal canal and anal papillae. Pancolonic diverticula. Normal terminal ileum. Status post segmental biopsy, descending colon biopsy showed minimal cryptitis. Biopsies felt to be  nonspecific but could be seen with NSAIDs. No features of inflammatory bowel disease. Stool studies were negative.  . Colonoscopy  03/21/2011    RMR: colonic polyps treated as described above, colonic diverticulosis (Tubular adenomas)  . Cholecystectomy  2008 - approximate  . Abdominal hysterectomy  2001 - approximate  . Lipoma excision  03/17/2012    Procedure: EXCISION LIPOMA;   Surgeon: Gwenyth Ober, MD;  Location: [facility];  Service: General;  Laterality: Right;  excision of right lower back lipoma  . Coronary angioplasty with stent placement    . Esophagogastroduodenoscopy  03/21/2011    RMR: normal esophagus- status post passage of Maloney dilator. Small hiatal hernia. Trivial antral and bulbar erosions    Family History  Problem Relation Age of Onset  . Heart attack Mother   . Hypertension Mother   . Stroke Mother     Deceased, age 1  . Diabetes Sister   . Diabetes Sister   .  Cancer Sister     ovarian  . Stroke Sister   . Diabetes Brother     also has a blood disorder   . Clotting disorder Brother   . Melanoma Brother     deceased  . Coronary artery disease Other   . Kidney disease Other   . Colon cancer Neg Hx     History   Social History  . Marital Status: Married    Spouse Name: N/A  . Number of Children: 3  . Years of Education: N/A   Occupational History  . Retired    Social History Main Topics  . Smoking status: Former Smoker -- 0.50 packs/day for 0 years    Types: Cigarettes    Quit date: 03/13/1981  . Smokeless tobacco: Never Used     Comment: quit 30 +years ago  . Alcohol Use: No  . Drug Use: No  . Sexual Activity: No   Other Topics Concern  . Not on file   Social History Narrative   Married      ROS:  General: Negative for anorexia, weight loss, fever, chills, fatigue, weakness. Eyes: Negative for vision changes.  ENT: Negative for hoarseness, difficulty swallowing , nasal congestion. CV: Negative for chest pain, angina, palpitations, dyspnea on exertion, peripheral edema.  Respiratory: Negative for dyspnea at rest, dyspnea on exertion, cough, sputum, wheezing.  GI: See history of present illness. GU:  Negative for dysuria, hematuria, urinary incontinence, urinary frequency, nocturnal urination.  MS: Negative for joint pain, low back pain.  Derm: Negative for rash or itching.  Neuro:  Negative for weakness, abnormal sensation, seizure, frequent headaches, memory loss, confusion.  Psych: Negative for anxiety, depression, suicidal ideation, hallucinations.  Endo: Negative for unusual weight change.  Heme: Negative for bruising or bleeding. Allergy: Negative for rash or hives.    Physical Examination:  BP 141/69 mmHg  Pulse 65  Temp(Src) 97.2 F (36.2 C)  Ht 5' 2"  (1.575 m)  Wt 190 lb 9.6 oz (86.456 kg)  BMI 34.85 kg/m2   General: Well-nourished, well-developed in no acute distress.  Head: Normocephalic, atraumatic.   Eyes: Conjunctiva pink, no icterus. Mouth: Oropharyngeal mucosa moist and pink , no lesions erythema or exudate. Neck: Supple without thyromegaly, masses, or lymphadenopathy.  Lungs: Clear to auscultation bilaterally.  Heart: Regular rate and rhythm, no murmurs rubs or gallops.  Abdomen: Bowel sounds are normal, mild RUQ pain with deep palpation, nondistended, no hepatosplenomegaly or masses, no abdominal bruits or    hernia , no rebound or guarding.  Obese. Exam limited. Rectal: not performed Extremities: No lower extremity edema. No clubbing or deformities.  Neuro: Alert and oriented x 4 , grossly normal neurologically.  Skin: Warm and dry, no rash or jaundice.   Psych: Alert and cooperative, normal mood and affect.  Labs: Lab Results  Component Value Date   HGBA1C 6.6% 05/20/2014   Lab Results  Component Value Date   CREATININE 0.87 05/20/2014   BUN 15 05/20/2014   NA 144 05/20/2014   K 4.3 05/20/2014   CL 102 05/20/2014   CO2 26 05/20/2014   Lab Results  Component Value Date   ALT 19 05/20/2014   AST 19 05/20/2014   ALKPHOS 75 05/20/2014   BILITOT 0.6 05/20/2014     Hgb 13 on 04/28/14  Imaging Studies: Dg Abd 1 View  04/28/2014   CLINICAL DATA:  Right upper quadrant pain.  EXAM: ABDOMEN - 1 VIEW  COMPARISON:  CT 09/21/2010  FINDINGS: Prior cholecystectomy.  There is a non obstructive bowel gas pattern. No supine evidence of  free air. No organomegaly or suspicious calcification.No acute bony abnormality.  IMPRESSION: No acute findings.   Electronically Signed   By: Rolm Baptise M.D.   On: 04/28/2014 12:24

## 2014-05-26 NOTE — Assessment & Plan Note (Signed)
RUQ pain, nonspecific. Intermittent. To discuss with Dr. Gala Romney, may offer EGD versus CT. Recent labs were reassuring.

## 2014-05-26 NOTE — Assessment & Plan Note (Signed)
Alternating constipation and diarrhea for years. Currently constipated predominant. Not on any type of bowel regimen. Will restart Miralax. Patient is adamant about having a colonoscopy, she was not due until 2 more years from now for history tubular adenomas. Suspect we may be dealing with IBS, diabetic enteropathy not excluded, related to medications. We will check for celiac disease which she has not been checked for as far as I can tell. Once these results are back, will discuss with Dr. Gala Romney to determine whether or not colonoscopy should be planned. Again suggested the patient today that endoscopic evaluation may not be necessary but she is quite adamant about pursuing.

## 2014-05-26 NOTE — Assessment & Plan Note (Signed)
Recurrent solid food dysphagia. Previously her esophagus appeared normal in 2009/2012. Typical GERD well-controlled. Continue PPI therapy. She may require EGD for RUQ discomfort but if not, consider BPE. To discuss with Dr. Gala Romney.

## 2014-05-26 NOTE — Patient Instructions (Signed)
1. Start back on MiraLAX 1 capful twice daily until you have soft bowel movement. Then you'll take one dose at bedtime on days you have not had a bowel movement. 2. Please have your lab work done. 3. I will discuss her case with Dr. Gala Romney, further recommendations to follow once her labs are back.

## 2014-05-27 ENCOUNTER — Other Ambulatory Visit (INDEPENDENT_AMBULATORY_CARE_PROVIDER_SITE_OTHER): Payer: Medicare Other

## 2014-05-27 DIAGNOSIS — R1319 Other dysphagia: Secondary | ICD-10-CM

## 2014-05-27 DIAGNOSIS — R1310 Dysphagia, unspecified: Secondary | ICD-10-CM

## 2014-05-27 DIAGNOSIS — R1011 Right upper quadrant pain: Secondary | ICD-10-CM

## 2014-05-27 DIAGNOSIS — K625 Hemorrhage of anus and rectum: Secondary | ICD-10-CM

## 2014-05-27 NOTE — Progress Notes (Signed)
Labs for dr. Arline Asp

## 2014-05-31 LAB — TISSUE TRANSGLUTAMINASE, IGA: Transglutaminase IgA: <2 U/mL (ref 0–3)

## 2014-06-01 ENCOUNTER — Encounter: Payer: Self-pay | Admitting: Family

## 2014-06-01 ENCOUNTER — Telehealth: Payer: Self-pay | Admitting: *Deleted

## 2014-06-01 NOTE — Progress Notes (Signed)
cc'ed to pcp °

## 2014-06-01 NOTE — Telephone Encounter (Signed)
-----   Message from Sharion Balloon, [place] sent at 05/31/2014  5:38 PM EST ----- Pt is not allergic to gluten

## 2014-06-02 NOTE — Telephone Encounter (Signed)
Returning [facility] call.  Call Niylah at [phone]

## 2014-06-03 ENCOUNTER — Encounter: Payer: Self-pay | Admitting: *Deleted

## 2014-06-04 NOTE — Telephone Encounter (Signed)
Patient aware,not  Allergic to gluten.

## 2014-06-04 NOTE — Telephone Encounter (Signed)
Returning nurse call.  Call her at [phone]

## 2014-06-07 ENCOUNTER — Telehealth: Payer: Self-pay

## 2014-06-07 ENCOUNTER — Other Ambulatory Visit: Payer: Self-pay

## 2014-06-07 MED ORDER — PEG 3350-KCL-NA BICARB-NACL 420 G PO SOLR: 4000.0000 mL | Freq: Once | ORAL | Status: DC

## 2014-06-07 NOTE — Progress Notes (Signed)
Tried to call pt- LMOM 

## 2014-06-07 NOTE — Telephone Encounter (Signed)
Due to CHADS2VASc score of 5 - patient will need to bridge with lovenox while off warfarin and after procedure.  We can provide bridging instruction for patient and to your office once date of procedure know.  She has appointment in our office to recheck INR 06/14/14.  If we start holding warfarin on that date or after she could have procedure as early as 06/18/14.   Let me know if it will be sooner and I can have patient come in sooner.   Cherre Robins, PharmD [place] [phone] ext [phone]

## 2014-06-07 NOTE — Progress Notes (Signed)
Sent message to [name] Eckard pharmD at [facility] for coumadin instructions.

## 2014-06-07 NOTE — Progress Notes (Signed)
Please let patient know that Dr. Gala Romney agrees to EGD/ED/TCS (dx:ruq pain, esophageal dysphagia, change in bowels, h/o tubular adenomas).  -Days of clear liquids: metformin 500mg  at breakfast. -Please do two days of clear liquids.  -Split dose trilyte or moviprep. -We need to find out of [name] Eckard PharmMD at [employer] if we can hold coumadin for four days before procedure.  -Recommend deep sedation in OR this time due to polypharmacy.  -Please make sure patient is have regular BMs on miralax prior to bowel prep.

## 2014-06-07 NOTE — Progress Notes (Signed)
Per Freida Busman- pt has been informed of procedures.

## 2014-06-07 NOTE — Telephone Encounter (Signed)
Rhonda Garcia, this patient was seen in our office and needs to have a colonoscopy and EGD with dilation due to ruq pain, esophageal dysphagia, change in bowels, h/o tubular adenomas. We need to know if its ok to hold her coumadin for 4 days prior to her procedures?   Please advise.  Thank you,  Burnadette Peter LPN [facility]

## 2014-06-07 NOTE — Telephone Encounter (Signed)
Pt is aware of tests. Pt is set up for 06/17/2014 @ 9:00am

## 2014-06-07 NOTE — Telephone Encounter (Signed)
Thank you! We will let you know as soon as we get her scheduled.

## 2014-06-07 NOTE — Telephone Encounter (Signed)
Warfarin-Lovenox Bridging Plan for [employer] (DOB: 1941-02-07 Wt = 86 kg Procedure - colonoscopy and EDG with dilation (needs to hold warfarin 4 days prior)  Friday, March 5th Last dose of warfarin   Saturday, March 6th No warfarin   Sunday, March 7th No warfarin  Begin Lovenox/enoxaparin 80mg  one dose this evening   Monday, March 8th No warfarin Lovenox/enoxaparin  80mg  in the morning  and 80mg  in the evening  Tuesday, March 9th No warfarin Lovenox/enoxaparin  80mg  in the morning  and 80mg  in the evening -  no later than 6pm  Wed, March 10th Day of procedue No warfarin No Lovenox/enoxaparin  Thursday, March 11th Restart warfarin 5mg  Take 1 and  tablets Restart Lovenox/enoxaparin 80mg  in the morning and 80mg  in the evening  Friday, March 12th Come to office to have INR / checked Appointment 06/19/2014 at [facility] Warfarin 5mg   Take 1 and  tablets Lovenox/enoxaparin 80mg  in the morning

## 2014-06-07 NOTE — Progress Notes (Signed)
Tried to call pt- LMOM [name] at [facility] sent a note back about pts coumadin. Please see phone note.

## 2014-06-07 NOTE — Telephone Encounter (Signed)
Pt is set up for 06/17/2014 for egd/tcs

## 2014-06-08 ENCOUNTER — Other Ambulatory Visit: Payer: Self-pay

## 2014-06-08 NOTE — Telephone Encounter (Signed)
Pt is aware of holding coumadin starting on 06/13/2014.

## 2014-06-08 NOTE — Telephone Encounter (Signed)
Candy, Please call patient back and let her know that she will take her last dose of coumadin on 06/12/14 as detailed by Cherre Robins, PHARMD NOT 06/15/14. Thank you!  Let's also please clarify whether or not we need to write the script for Lovenox.

## 2014-06-08 NOTE — Telephone Encounter (Signed)
Called and spoke with pt and she is aware to hold her coumadin starting on 06/15/2014.  She is also aware of her pre-op appt on 06/14/2014 @ 9:00am

## 2014-06-08 NOTE — Telephone Encounter (Signed)
I have sent a message to [name] Eckard about the lovenox rx.

## 2014-06-09 NOTE — Telephone Encounter (Signed)
[name] Eckard at [facility], she will be sending in the rx for the lovenox.

## 2014-06-09 NOTE — Telephone Encounter (Signed)
Noted pt. aware

## 2014-06-10 ENCOUNTER — Ambulatory Visit (INDEPENDENT_AMBULATORY_CARE_PROVIDER_SITE_OTHER): Payer: Medicare Other | Admitting: Pharmacist

## 2014-06-10 DIAGNOSIS — I480 Paroxysmal atrial fibrillation: Secondary | ICD-10-CM | POA: Diagnosis not present

## 2014-06-10 LAB — POCT INR: INR: 2.1

## 2014-06-10 MED ORDER — ENOXAPARIN SODIUM 80 MG/0.8ML ~~LOC~~ SOLN: 80.0000 mg | Freq: Two times a day (BID) | SUBCUTANEOUS | Status: DC

## 2014-06-10 NOTE — Patient Instructions (Signed)
Warfarin-Lovenox Bridging Plan for [employer] (DOB: February 12, 1941 Wt = 86 kg Procedure - colonoscopy and EDG with dilation (needs to hold warfarin 4 days prior)  Saturday, March 5th No warfarin   Sunday March 6th No warfarin Begin Lovenox/enoxaparin 80mg  one dose this evening  Monday, march 7th No warfarin   Lovenox/enoxaparin 80mg  in the morning and 80mg  in the evening  Tuesday, March 8th No warfarin Lovenox/enoxaparin  80mg  in the morning  and 80mg  in the evening  Wednesday, March 9th No warfarin Lovenox/enoxaparin  80mg  in the morning   Thursday, March 10th Day of procedure No warfarin No Lovenox/enoxaparin  Friday, March 11th Restart warfarin 5mg  Take 1 and  tablets Restart Lovenox/enoxaparin 80mg  in the morning and 80mg  in the evening  Saturday, March 12th  Warfarin 5mg   Take 1 and  tablets Lovenox/enoxaparin 80mg  in the morning and 80mg  in the evening    Sunday, march 13th and after restart regular dose of warfarin 5mg  1 tablet on sundays, mondays, wednesdays and fridays.  Take 1/2 tablet tuesdays, thursdays and saturdays.

## 2014-06-10 NOTE — Progress Notes (Signed)
Warfarin-Lovenox Bridging Plan for [employer] (DOB: 1940/11/04 Wt = 86 kg Procedure - colonoscopy and EDG with dilation (needs to hold warfarin 4 days prior)  Saturday, March 5th No warfarin   Sunday March 6th No warfarin Begin Lovenox/enoxaparin 80mg  one dose this evening  Monday, march 7th No warfarin   Lovenox/enoxaparin 80mg  in the morning and 80mg  in the evening  Tuesday, March 8th No warfarin Lovenox/enoxaparin  80mg  in the morning  and 80mg  in the evening  Wednesday, March 9th No warfarin Lovenox/enoxaparin  80mg  in the morning   Thursday, March 10th Day of procedure No warfarin No Lovenox/enoxaparin  Friday, March 11th Restart warfarin 5mg  Take 1 and  tablets Restart Lovenox/enoxaparin 80mg  in the morning and 80mg  in the evening  Saturday, March 12th  Warfarin 5mg   Take 1 and  tablets Lovenox/enoxaparin 80mg  in the morning and 80mg  in the evening   Sunday, march 13th and after restart regular dose of warfarin 5mg  1 tablet on sundays, mondays, wednesdays and fridays.  Take 1/2 tablet tuesdays, thursdays and saturdays.

## 2014-06-11 NOTE — Patient Instructions (Signed)
[facility]  06/11/2014   Your procedure is scheduled on:  Thursday, 06/17/14  Report to [facility] at 0730 AM.  Call this number if you have problems the morning of surgery: [phone]   Remember:   Do not eat food or drink liquids after midnight.   Take these medicines the morning of surgery with A SIP OF WATER: amlodipine, lorcet if needed, IMDUR, zestoretic, ativan if you are nervous, and metoprolol   Do not wear jewelry, make-up or nail polish.  Do not wear lotions, powders, or perfumes. You may wear deodorant.  Do not shave 48 hours prior to surgery. Men may shave face and neck.  Do not bring valuables to the hospital.  [facility] is not responsible                  for any belongings or valuables.               Contacts, dentures or bridgework may not be worn into surgery.  Leave suitcase in the car. After surgery it may be brought to your room.  For patients admitted to the hospital, discharge time is determined by your                treatment team.               Patients discharged the day of surgery will not be allowed to drive  home.  Name and phone number of your driver: family  Special Instructions: please follow instructions regarding diet and prep that was given to you by Dr. Roseanne Kaufman office.   Please read over the following fact sheets that you were given: Anesthesia Post-op Instructions and Care and Recovery After Surgery  Esophageal Dilatation The esophagus is the long, narrow tube which carries food and liquid from the mouth to the stomach. Esophageal dilatation is the technique used to stretch a blocked or narrowed portion of the esophagus. This procedure is used when a part of the esophagus has become so narrow that it becomes difficult, painful or even impossible to swallow. This is generally an uncomplicated form of treatment. When this is not successful, chest surgery may be required. This is a much more extensive form of treatment with a longer recovery  time. CAUSES  Some of the more common causes of blockage or strictures of the esophagus are:  Narrowing from longstanding inflammation (soreness and redness) of the lower esophagus. This comes from the constant exposure of the lower esophagus to the acid which bubbles up from the stomach. Over time this causes scarring and narrowing of the lower esophagus.  Hiatal hernia in which a small part of the stomach bulges (herniates) up through the diaphragm. This can cause a gradual narrowing of the end of the esophagus.  Schatzki ring is a narrow ring of benign (non-cancerous) fibrous tissue which constricts the lower esophagus. The reason for this is not known.  Scleroderma is a connective tissue disorder that affects the esophagus and makes swallowing difficult.  Achalasia is an absence of nerves to the lower esophagus and to the esophageal sphincter. This is the circular muscle between the stomach and esophagus that relaxes to allow food into the stomach. After swallowing, it contracts to keep food in the stomach. This absence of nerves may be congenital (present since birth). This can cause irregular spasms of the lower esophageal muscle. This spasm does not open up to allow food and fluid through. The result is a persistent blockage with subsequent  slow trickling of the esophageal contents into the stomach.  Strictures may develop from swallowing materials which damage the esophagus. Some examples are strong acids or alkalis such as lye.  Growths such as benign (non-cancerous) and malignant (cancerous) tumors can block the esophagus.  Hereditary (present since birth) causes. DIAGNOSIS  Your caregiver often suspects this problem by taking a medical history. They will also do a physical exam. They can then prove their suspicions using X-rays and endoscopy. Endoscopy is an exam in which a tube like a small, flexible telescope is used to look at your esophagus.  TREATMENT There are different  stretching (dilating) techniques that can be used. Simple bougie dilatation may be done in the office. This usually takes only a couple minutes. A numbing (anesthetic) spray of the throat is used. Endoscopy, when done, is done in an endoscopy suite under mild sedation. When fluoroscopy is used, the procedure is performed in X-ray. Other techniques require a little longer time. Recovery is usually quick. There is no waiting time to begin eating and drinking to test success of the treatment. Following are some of the methods used. Narrowing of the esophagus is treated by making it bigger. Commonly this is a mechanical problem which can be treated with stretching. This can be done in different ways. Your caregiver will discuss these with you. Some of the means used are:  A series of graduated (increasing thickness) flexible dilators can be used. These are weighted tubes passed through the esophagus into the stomach. The tubes used become progressively larger until the desired stretched size is reached. Graduated dilators are a simple and quick way of opening the esophagus. No visualization is required.  Another method is the use of endoscopy to place a flexible wire across the stricture. The endoscope is removed and the wire left in place. A dilator with a hole through it from end to end is guided down the esophagus and across the stricture. One or more of these dilators are passed over the wire. At the end of the exam, the wire is removed. This type of treatment may be performed in the X-ray department under fluoroscopy. An advantage of this procedure is the examiner is visualizing the end opening in the esophagus.  Stretching of the esophagus may be done using balloons. Deflated balloons are placed through the endoscope and across the stricture. This type of balloon dilatation is often done at the time of endoscopy or fluoroscopy. Flexible endoscopy allows the examiner to directly view the stricture. A  balloon is inserted in the deflated form into the area of narrowing. It is then inflated with air to a certain pressure that is preset for a given circumference. When inflated, it becomes sausage shaped, stretched, and makes the stricture larger.  Achalasia requires a longer, larger balloon-type dilator. This is frequently done under X-ray control. In this situation, the spastic muscle fibers in the lower esophagus are stretched. All of the above procedures make the passage of food and water into the stomach easier. They also make it easier for stomach contents to reflux back into the esophagus. Special medications may be used following the procedure to help prevent further stricturing. Proton-pump inhibitor medications are good at decreasing the amount of acid in the stomach juice. When stomach juice refluxes into the esophagus, the juice is no longer as acidic and is less likely to burn or scar the esophagus. RISKS AND COMPLICATIONS Esophageal dilatation is usually performed effectively and without problems. Some complications that can occur  are:  A small amount of bleeding almost always happens where the stretching takes place. If this is too excessive it may require more aggressive treatment.  An uncommon complication is perforation (making a hole) of the esophagus. The esophagus is thin. It is easy to make a hole in it. If this happens, an operation may be necessary to repair this.  A small, undetected perforation could lead to an infection in the chest. This can be very serious. HOME CARE INSTRUCTIONS   If you received sedation for your procedure, do not drive, make important decisions, or perform any activities requiring your full coordination. Do not drink alcohol, take sedatives, or use any mind altering chemicals unless instructed by your caregiver.  You may use throat lozenges or warm salt water gargles if you have throat discomfort.  You can begin eating and drinking normally on return  home unless instructed otherwise. Do not purposely try to force large chunks of food down to test the benefits of your procedure.  Mild discomfort can be eased with sips of ice water.  Medications for discomfort may or may not be needed. SEEK IMMEDIATE MEDICAL CARE IF:   You begin vomiting up blood.  You develop black, tarry stools.  You develop chills or an unexplained temperature of over 101F (38.3C)  You develop chest or abdominal pain.  You develop shortness of breath, or feel light-headed or faint.  Your swallowing is becoming more painful, difficult, or you are unable to swallow. MAKE SURE YOU:   Understand these instructions.  Will watch your condition.  Will get help right away if you are not doing well or get worse. Document Released: 05/17/2005 Document Revised: 08/10/2013 Document Reviewed: 07/04/2005 [facility] Patient Information 2015 [place], [place]. This information is not intended to replace advice given to you by your health care provider. Make sure you discuss any questions you have with your health care provider. Esophagogastroduodenoscopy Esophagogastroduodenoscopy (EGD) is a procedure to examine the lining of the esophagus, stomach, and first part of the small intestine (duodenum). A long, flexible, lighted tube with a camera attached (endoscope) is inserted down the throat to view these organs. This procedure is done to detect problems or abnormalities, such as inflammation, bleeding, ulcers, or growths, in order to treat them. The procedure lasts about 5-20 minutes. It is usually an outpatient procedure, but it may need to be performed in emergency cases in the hospital. LET YOUR CAREGIVER KNOW ABOUT:   Allergies to food or medicine.  All medicines you are taking, including vitamins, herbs, eyedrops, and over-the-counter medicines and creams.  Use of steroids (by mouth or creams).  Previous problems you or members of your family have had with the use of  anesthetics.  Any blood disorders you have.  Previous surgeries you have had.  Other health problems you have.  Possibility of pregnancy, if this applies. RISKS AND COMPLICATIONS  Generally, EGD is a safe procedure. However, as with any procedure, complications can occur. Possible complications include:  Infection.  Bleeding.  Tearing (perforation) of the esophagus, stomach, or duodenum.  Difficulty breathing or not being able to breath.  Excessive sweating.  Spasms of the larynx.  Slowed heartbeat.  Low blood pressure. BEFORE THE PROCEDURE  Do not eat or drink anything for 6-8 hours before the procedure or as directed by your caregiver.  Ask your caregiver about changing or stopping your regular medicines.  If you wear dentures, be prepared to remove them before the procedure.  Arrange for someone to  drive you home after the procedure. PROCEDURE   A vein will be accessed to give medicines and fluids. A medicine to relax you (sedative) and a pain reliever will be given through that access into the vein.  A numbing medicine (local anesthetic) may be sprayed on your throat for comfort and to stop you from gagging or coughing.  A mouth guard may be placed in your mouth to protect your teeth and to keep you from biting on the endoscope.  You will be asked to lie on your left side.  The endoscope is inserted down your throat and into the esophagus, stomach, and duodenum.  Air is put through the endoscope to allow your caregiver to view the lining of your esophagus clearly.  The esophagus, stomach, and duodenum is then examined. During the exam, your caregiver may:  Remove tissue to be examined under a microscope (biopsy) for inflammation, infection, or other medical problems.  Remove growths.  Remove objects (foreign bodies) that are stuck.  Treat any bleeding with medicines or other devices that stop tissues from bleeding (hot cautery, clipping devices).  Widen  (dilate) or stretch narrowed areas of the esophagus and stomach.  The endoscope will then be withdrawn. AFTER THE PROCEDURE  You will be taken to a recovery area to be monitored. You will be able to go home once you are stable and alert.  Do not eat or drink anything until the local anesthetic and numbing medicines have worn off. You may choke.  It is normal to feel bloated, have pain with swallowing, or have a sore throat for a short time. This will wear off.  Your caregiver should be able to discuss his or her findings with you. It will take longer to discuss the test results if any biopsies were taken. Document Released: 07/27/2004 Document Revised: 08/10/2013 Document Reviewed: 02/27/2012 [facility] Patient Information 2015 [place], [place]. This information is not intended to replace advice given to you by your health care provider. Make sure you discuss any questions you have with your health care provider. Esophagogastroduodenoscopy Care After Refer to this sheet in the next few weeks. These instructions provide you with information on caring for yourself after your procedure. Your caregiver may also give you more specific instructions. Your treatment has been planned according to current medical practices, but problems sometimes occur. Call your caregiver if you have any problems or questions after your procedure.  HOME CARE INSTRUCTIONS  Do not eat or drink anything until the numbing medicine (local anesthetic) has worn off and your gag reflex has returned. You will know that the local anesthetic has worn off when you can swallow comfortably.  Do not drive for 12 hours after the procedure or as directed by your caregiver.  Only take medicines as directed by your caregiver. SEEK MEDICAL CARE IF:   You cannot stop coughing.  You are not urinating at all or less than usual. SEEK IMMEDIATE MEDICAL CARE IF:  You have difficulty swallowing.  You cannot eat or drink.  You have  worsening throat or chest pain.  You have dizziness, lightheadedness, or you faint.  You have nausea or vomiting.  You have chills.  You have a fever.  You have severe abdominal pain.  You have black, tarry, or bloody stools. Document Released: 03/12/2012 Document Reviewed: 03/12/2012 [facility] Patient Information 2015 [place]. This information is not intended to replace advice given to you by your health care provider. Make sure you discuss any questions you have with  your health care provider. Colonoscopy, Care After Refer to this sheet in the next few weeks. These instructions provide you with information on caring for yourself after your procedure. Your health care provider may also give you more specific instructions. Your treatment has been planned according to current medical practices, but problems sometimes occur. Call your health care provider if you have any problems or questions after your procedure. WHAT TO EXPECT AFTER THE PROCEDURE  After your procedure, it is typical to have the following:  A small amount of blood in your stool.  Moderate amounts of gas and mild abdominal cramping or bloating. HOME CARE INSTRUCTIONS  Do not drive, operate machinery, or sign important documents for 24 hours.  You may shower and resume your regular physical activities, but move at a slower pace for the first 24 hours.  Take frequent rest periods for the first 24 hours.  Walk around or put a warm pack on your abdomen to help reduce abdominal cramping and bloating.  Drink enough fluids to keep your urine clear or pale yellow.  You may resume your normal diet as instructed by your health care provider. Avoid heavy or fried foods that are hard to digest.  Avoid drinking alcohol for 24 hours or as instructed by your health care provider.  Only take over-the-counter or prescription medicines as directed by your health care provider.  If a tissue sample (biopsy) was taken during  your procedure:  Do not take aspirin or blood thinners for 7 days, or as instructed by your health care provider.  Do not drink alcohol for 7 days, or as instructed by your health care provider.  Eat soft foods for the first 24 hours. SEEK MEDICAL CARE IF: You have persistent spotting of blood in your stool 2-3 days after the procedure. SEEK IMMEDIATE MEDICAL CARE IF:  You have more than a small spotting of blood in your stool.  You pass large blood clots in your stool.  Your abdomen is swollen (distended).  You have nausea or vomiting.  You have a fever.  You have increasing abdominal pain that is not relieved with medicine. Document Released: 11/08/2003 Document Revised: 01/14/2013 Document Reviewed: 12/01/2012 [facility] Patient Information 2015 [place], [place]. This information is not intended to replace advice given to you by your health care provider. Make sure you discuss any questions you have with your health care provider. Colonoscopy A colonoscopy is an exam to look at the entire large intestine (colon). This exam can help find problems such as tumors, polyps, inflammation, and areas of bleeding. The exam takes about 1 hour.  LET [facility] CARE PROVIDER KNOW ABOUT:   Any allergies you have.  All medicines you are taking, including vitamins, herbs, eye drops, creams, and over-the-counter medicines.  Previous problems you or members of your family have had with the use of anesthetics.  Any blood disorders you have.  Previous surgeries you have had.  Medical conditions you have. RISKS AND COMPLICATIONS  Generally, this is a safe procedure. However, as with any procedure, complications can occur. Possible complications include:  Bleeding.  Tearing or rupture of the colon wall.  Reaction to medicines given during the exam.  Infection (rare). BEFORE THE PROCEDURE   Ask your health care provider about changing or stopping your regular medicines.  You may be  prescribed an oral bowel prep. This involves drinking a large amount of medicated liquid, starting the day before your procedure. The liquid will cause you to have multiple loose  stools until your stool is almost clear or light green. This cleans out your colon in preparation for the procedure.  Do not eat or drink anything else once you have started the bowel prep, unless your health care provider tells you it is safe to do so.  Arrange for someone to drive you home after the procedure. PROCEDURE   You will be given medicine to help you relax (sedative).  You will lie on your side with your knees bent.  A long, flexible tube with a light and camera on the end (colonoscope) will be inserted through the rectum and into the colon. The camera sends video back to a computer screen as it moves through the colon. The colonoscope also releases carbon dioxide gas to inflate the colon. This helps your health care provider see the area better.  During the exam, your health care provider may take a small tissue sample (biopsy) to be examined under a microscope if any abnormalities are found.  The exam is finished when the entire colon has been viewed. AFTER THE PROCEDURE   Do not drive for 24 hours after the exam.  You may have a small amount of blood in your stool.  You may pass moderate amounts of gas and have mild abdominal cramping or bloating. This is caused by the gas used to inflate your colon during the exam.  Ask when your test results will be ready and how you will get your results. Make sure you get your test results. Document Released: 03/23/2000 Document Revised: 01/14/2013 Document Reviewed: 12/01/2012 [facility] Patient Information 2015 [place], [place]. This information is not intended to replace advice given to you by your health care provider. Make sure you discuss any questions you have with your health care provider.

## 2014-06-14 ENCOUNTER — Encounter (HOSPITAL_COMMUNITY)
Admission: RE | Admit: 2014-06-14 | Discharge: 2014-06-14 | Disposition: A | Discharge status: Home / Self Care | Payer: Medicare Other | Source: Ambulatory Visit | Attending: Internal Medicine | Admitting: Internal Medicine

## 2014-06-14 ENCOUNTER — Encounter (HOSPITAL_COMMUNITY): Payer: Self-pay

## 2014-06-14 DIAGNOSIS — I10 Essential (primary) hypertension: Secondary | ICD-10-CM | POA: Diagnosis not present

## 2014-06-14 DIAGNOSIS — M1990 Unspecified osteoarthritis, unspecified site: Secondary | ICD-10-CM | POA: Diagnosis not present

## 2014-06-14 DIAGNOSIS — M810 Age-related osteoporosis without current pathological fracture: Secondary | ICD-10-CM | POA: Diagnosis not present

## 2014-06-14 DIAGNOSIS — K5730 Diverticulosis of large intestine without perforation or abscess without bleeding: Secondary | ICD-10-CM | POA: Diagnosis not present

## 2014-06-14 DIAGNOSIS — Z87891 Personal history of nicotine dependence: Secondary | ICD-10-CM | POA: Diagnosis not present

## 2014-06-14 DIAGNOSIS — I2510 Atherosclerotic heart disease of native coronary artery without angina pectoris: Secondary | ICD-10-CM | POA: Diagnosis not present

## 2014-06-14 DIAGNOSIS — G8929 Other chronic pain: Secondary | ICD-10-CM | POA: Diagnosis not present

## 2014-06-14 DIAGNOSIS — Z79891 Long term (current) use of opiate analgesic: Secondary | ICD-10-CM | POA: Diagnosis not present

## 2014-06-14 DIAGNOSIS — E785 Hyperlipidemia, unspecified: Secondary | ICD-10-CM | POA: Diagnosis not present

## 2014-06-14 DIAGNOSIS — I4891 Unspecified atrial fibrillation: Secondary | ICD-10-CM | POA: Diagnosis not present

## 2014-06-14 DIAGNOSIS — D122 Benign neoplasm of ascending colon: Secondary | ICD-10-CM | POA: Diagnosis not present

## 2014-06-14 DIAGNOSIS — R1011 Right upper quadrant pain: Secondary | ICD-10-CM | POA: Diagnosis present

## 2014-06-14 DIAGNOSIS — M8580 Other specified disorders of bone density and structure, unspecified site: Secondary | ICD-10-CM | POA: Diagnosis not present

## 2014-06-14 DIAGNOSIS — Z792 Long term (current) use of antibiotics: Secondary | ICD-10-CM | POA: Diagnosis not present

## 2014-06-14 DIAGNOSIS — F329 Major depressive disorder, single episode, unspecified: Secondary | ICD-10-CM | POA: Diagnosis not present

## 2014-06-14 DIAGNOSIS — K449 Diaphragmatic hernia without obstruction or gangrene: Secondary | ICD-10-CM | POA: Diagnosis not present

## 2014-06-14 DIAGNOSIS — K219 Gastro-esophageal reflux disease without esophagitis: Secondary | ICD-10-CM | POA: Diagnosis not present

## 2014-06-14 DIAGNOSIS — Z86010 Personal history of colonic polyps: Secondary | ICD-10-CM | POA: Diagnosis not present

## 2014-06-14 DIAGNOSIS — H9190 Unspecified hearing loss, unspecified ear: Secondary | ICD-10-CM | POA: Diagnosis not present

## 2014-06-14 DIAGNOSIS — I252 Old myocardial infarction: Secondary | ICD-10-CM | POA: Diagnosis not present

## 2014-06-14 DIAGNOSIS — Z955 Presence of coronary angioplasty implant and graft: Secondary | ICD-10-CM | POA: Diagnosis not present

## 2014-06-14 DIAGNOSIS — Z9889 Other specified postprocedural states: Secondary | ICD-10-CM | POA: Diagnosis not present

## 2014-06-14 DIAGNOSIS — Z8711 Personal history of peptic ulcer disease: Secondary | ICD-10-CM | POA: Diagnosis not present

## 2014-06-14 DIAGNOSIS — R194 Change in bowel habit: Secondary | ICD-10-CM | POA: Diagnosis present

## 2014-06-14 DIAGNOSIS — G4730 Sleep apnea, unspecified: Secondary | ICD-10-CM | POA: Diagnosis not present

## 2014-06-14 DIAGNOSIS — D123 Benign neoplasm of transverse colon: Secondary | ICD-10-CM | POA: Diagnosis not present

## 2014-06-14 DIAGNOSIS — E109 Type 1 diabetes mellitus without complications: Secondary | ICD-10-CM | POA: Diagnosis not present

## 2014-06-14 DIAGNOSIS — Z7901 Long term (current) use of anticoagulants: Secondary | ICD-10-CM | POA: Diagnosis not present

## 2014-06-14 DIAGNOSIS — K228 Other specified diseases of esophagus: Secondary | ICD-10-CM | POA: Diagnosis not present

## 2014-06-14 DIAGNOSIS — K648 Other hemorrhoids: Secondary | ICD-10-CM | POA: Diagnosis not present

## 2014-06-14 DIAGNOSIS — R1319 Other dysphagia: Secondary | ICD-10-CM | POA: Diagnosis present

## 2014-06-14 DIAGNOSIS — Z9049 Acquired absence of other specified parts of digestive tract: Secondary | ICD-10-CM | POA: Diagnosis not present

## 2014-06-14 LAB — CBC
HCT: 37.6 % (ref 36.0–46.0)
Hemoglobin: 12.2 g/dL (ref 12.0–15.0)
MCH: 30.3 pg (ref 26.0–34.0)
MCHC: 32.4 g/dL (ref 30.0–36.0)
MCV: 93.5 fL (ref 78.0–100.0)
Platelets: 174 10*3/uL (ref 150–400)
RBC: 4.02 MIL/uL (ref 3.87–5.11)
RDW: 14.7 % (ref 11.5–15.5)
WBC: 5.3 10*3/uL (ref 4.0–10.5)

## 2014-06-14 LAB — BASIC METABOLIC PANEL
Anion gap: 5 (ref 5–15)
BUN: 17 mg/dL (ref 6–23)
CO2: 29 mmol/L (ref 19–32)
Calcium: 8.9 mg/dL (ref 8.4–10.5)
Chloride: 107 mmol/L (ref 96–112)
Creatinine, Ser: 0.88 mg/dL (ref 0.50–1.10)
GFR calc Af Amer: 74 mL/min — ABNORMAL LOW (ref >90)
GFR calc non Af Amer: 64 mL/min — ABNORMAL LOW (ref >90)
Glucose, Bld: 128 mg/dL — ABNORMAL HIGH (ref 70–99)
Potassium: 4.1 mmol/L (ref 3.5–5.1)
Sodium: 141 mmol/L (ref 135–145)

## 2014-06-14 NOTE — Pre-Procedure Instructions (Signed)
Patient given information to sign up for my chart at home. 

## 2014-06-17 ENCOUNTER — Encounter (HOSPITAL_COMMUNITY): Payer: Self-pay | Admitting: Emergency Medicine

## 2014-06-17 ENCOUNTER — Ambulatory Visit (HOSPITAL_COMMUNITY)
Admission: RE | Admit: 2014-06-17 | Discharge: 2014-06-17 | Disposition: A | Discharge status: Home / Self Care | Payer: Medicare Other | Source: Ambulatory Visit | Attending: Internal Medicine | Admitting: Internal Medicine

## 2014-06-17 ENCOUNTER — Ambulatory Visit (HOSPITAL_COMMUNITY): Payer: Medicare Other | Admitting: Anesthesiology

## 2014-06-17 ENCOUNTER — Encounter (HOSPITAL_COMMUNITY)
Admission: RE | Disposition: A | Discharge status: Home / Self Care | Payer: Self-pay | Source: Ambulatory Visit | Attending: Internal Medicine

## 2014-06-17 DIAGNOSIS — Z7901 Long term (current) use of anticoagulants: Secondary | ICD-10-CM | POA: Insufficient documentation

## 2014-06-17 DIAGNOSIS — M810 Age-related osteoporosis without current pathological fracture: Secondary | ICD-10-CM | POA: Insufficient documentation

## 2014-06-17 DIAGNOSIS — D123 Benign neoplasm of transverse colon: Secondary | ICD-10-CM | POA: Diagnosis not present

## 2014-06-17 DIAGNOSIS — K5730 Diverticulosis of large intestine without perforation or abscess without bleeding: Secondary | ICD-10-CM | POA: Diagnosis not present

## 2014-06-17 DIAGNOSIS — K648 Other hemorrhoids: Secondary | ICD-10-CM | POA: Insufficient documentation

## 2014-06-17 DIAGNOSIS — Z860100 Personal history of colon polyps, unspecified: Secondary | ICD-10-CM | POA: Insufficient documentation

## 2014-06-17 DIAGNOSIS — K219 Gastro-esophageal reflux disease without esophagitis: Secondary | ICD-10-CM | POA: Insufficient documentation

## 2014-06-17 DIAGNOSIS — E109 Type 1 diabetes mellitus without complications: Secondary | ICD-10-CM | POA: Insufficient documentation

## 2014-06-17 DIAGNOSIS — G8929 Other chronic pain: Secondary | ICD-10-CM | POA: Insufficient documentation

## 2014-06-17 DIAGNOSIS — Z8711 Personal history of peptic ulcer disease: Secondary | ICD-10-CM | POA: Insufficient documentation

## 2014-06-17 DIAGNOSIS — D122 Benign neoplasm of ascending colon: Secondary | ICD-10-CM

## 2014-06-17 DIAGNOSIS — Z955 Presence of coronary angioplasty implant and graft: Secondary | ICD-10-CM | POA: Insufficient documentation

## 2014-06-17 DIAGNOSIS — K449 Diaphragmatic hernia without obstruction or gangrene: Secondary | ICD-10-CM | POA: Insufficient documentation

## 2014-06-17 DIAGNOSIS — M1990 Unspecified osteoarthritis, unspecified site: Secondary | ICD-10-CM | POA: Insufficient documentation

## 2014-06-17 DIAGNOSIS — R1314 Dysphagia, pharyngoesophageal phase: Secondary | ICD-10-CM | POA: Insufficient documentation

## 2014-06-17 DIAGNOSIS — Z87891 Personal history of nicotine dependence: Secondary | ICD-10-CM | POA: Insufficient documentation

## 2014-06-17 DIAGNOSIS — Z9049 Acquired absence of other specified parts of digestive tract: Secondary | ICD-10-CM | POA: Insufficient documentation

## 2014-06-17 DIAGNOSIS — M8580 Other specified disorders of bone density and structure, unspecified site: Secondary | ICD-10-CM | POA: Insufficient documentation

## 2014-06-17 DIAGNOSIS — H9190 Unspecified hearing loss, unspecified ear: Secondary | ICD-10-CM | POA: Insufficient documentation

## 2014-06-17 DIAGNOSIS — G4730 Sleep apnea, unspecified: Secondary | ICD-10-CM | POA: Insufficient documentation

## 2014-06-17 DIAGNOSIS — Z79891 Long term (current) use of opiate analgesic: Secondary | ICD-10-CM | POA: Insufficient documentation

## 2014-06-17 DIAGNOSIS — F329 Major depressive disorder, single episode, unspecified: Secondary | ICD-10-CM | POA: Insufficient documentation

## 2014-06-17 DIAGNOSIS — E785 Hyperlipidemia, unspecified: Secondary | ICD-10-CM | POA: Insufficient documentation

## 2014-06-17 DIAGNOSIS — I4891 Unspecified atrial fibrillation: Secondary | ICD-10-CM | POA: Insufficient documentation

## 2014-06-17 DIAGNOSIS — I2510 Atherosclerotic heart disease of native coronary artery without angina pectoris: Secondary | ICD-10-CM | POA: Insufficient documentation

## 2014-06-17 DIAGNOSIS — I10 Essential (primary) hypertension: Secondary | ICD-10-CM | POA: Insufficient documentation

## 2014-06-17 DIAGNOSIS — I252 Old myocardial infarction: Secondary | ICD-10-CM | POA: Insufficient documentation

## 2014-06-17 DIAGNOSIS — Z9889 Other specified postprocedural states: Secondary | ICD-10-CM | POA: Insufficient documentation

## 2014-06-17 DIAGNOSIS — K228 Other specified diseases of esophagus: Secondary | ICD-10-CM | POA: Insufficient documentation

## 2014-06-17 DIAGNOSIS — Z792 Long term (current) use of antibiotics: Secondary | ICD-10-CM | POA: Insufficient documentation

## 2014-06-17 HISTORY — PX: POLYPECTOMY: SHX5525

## 2014-06-17 HISTORY — PX: ESOPHAGOGASTRODUODENOSCOPY (EGD) WITH PROPOFOL: SHX5813

## 2014-06-17 HISTORY — PX: COLONOSCOPY WITH PROPOFOL: SHX5780

## 2014-06-17 HISTORY — PX: ESOPHAGEAL DILATION: SHX303

## 2014-06-17 LAB — GLUCOSE, CAPILLARY: Glucose-Capillary: 119 mg/dL — ABNORMAL HIGH (ref 70–99)

## 2014-06-17 SURGERY — ESOPHAGOGASTRODUODENOSCOPY (EGD) WITH PROPOFOL
Anesthesia: Monitor Anesthesia Care

## 2014-06-17 MED ORDER — FENTANYL CITRATE 0.05 MG/ML IJ SOLN: 25.0000 ug | INTRAMUSCULAR | Status: DC | PRN

## 2014-06-17 MED ORDER — SODIUM CHLORIDE 0.9 % IJ SOLN
INTRAMUSCULAR | Status: AC
  Filled 2014-06-17: qty 10

## 2014-06-17 MED ORDER — MIDAZOLAM HCL 2 MG/2ML IJ SOLN
1.0000 mg | INTRAMUSCULAR | Status: AC | PRN
  Administered 2014-06-17: 1 mg via INTRAVENOUS
  Administered 2014-06-17: 2 mg via INTRAVENOUS
  Administered 2014-06-17: 1 mg via INTRAVENOUS
  Filled 2014-06-17: qty 2

## 2014-06-17 MED ORDER — LIDOCAINE HCL (PF) 1 % IJ SOLN
INTRAMUSCULAR | Status: AC
  Filled 2014-06-17: qty 10

## 2014-06-17 MED ORDER — FENTANYL CITRATE 0.05 MG/ML IJ SOLN
25.0000 ug | INTRAMUSCULAR | Status: AC
  Administered 2014-06-17 (×2): 25 ug via INTRAVENOUS

## 2014-06-17 MED ORDER — EPHEDRINE SULFATE 50 MG/ML IJ SOLN
INTRAMUSCULAR | Status: AC
Start: 2014-06-17 — End: 2014-06-17
  Filled 2014-06-17: qty 1

## 2014-06-17 MED ORDER — PROPOFOL 10 MG/ML IV BOLUS
INTRAVENOUS | Status: AC
  Filled 2014-06-17: qty 20

## 2014-06-17 MED ORDER — ONDANSETRON HCL 4 MG/2ML IJ SOLN: 4.0000 mg | Freq: Once | INTRAMUSCULAR | Status: DC | PRN

## 2014-06-17 MED ORDER — LIDOCAINE VISCOUS 2 % MT SOLN
15.0000 mL | Freq: Once | OROMUCOSAL | Status: AC
  Administered 2014-06-17: 15 mL via OROMUCOSAL

## 2014-06-17 MED ORDER — PROPOFOL INFUSION 10 MG/ML OPTIME
INTRAVENOUS | Status: DC | PRN
  Administered 2014-06-17: 10:00:00 via INTRAVENOUS
  Administered 2014-06-17: 125 ug/kg/min via INTRAVENOUS

## 2014-06-17 MED ORDER — PHENYLEPHRINE HCL 10 MG/ML IJ SOLN
INTRAMUSCULAR | Status: AC
  Filled 2014-06-17: qty 1

## 2014-06-17 MED ORDER — LIDOCAINE VISCOUS 2 % MT SOLN
OROMUCOSAL | Status: AC
  Administered 2014-06-17: 15 mL via OROMUCOSAL
  Filled 2014-06-17: qty 15

## 2014-06-17 MED ORDER — PROPOFOL 10 MG/ML IV BOLUS
INTRAVENOUS | Status: DC | PRN
  Administered 2014-06-17: 20 mg via INTRAVENOUS
  Administered 2014-06-17 (×2): 10 mg via INTRAVENOUS

## 2014-06-17 MED ORDER — MIDAZOLAM HCL 2 MG/2ML IJ SOLN
INTRAMUSCULAR | Status: AC
  Filled 2014-06-17: qty 2

## 2014-06-17 MED ORDER — LACTATED RINGERS IV SOLN
INTRAVENOUS | Status: DC
  Administered 2014-06-17: 75 mL/h via INTRAVENOUS

## 2014-06-17 MED ORDER — STERILE WATER FOR IRRIGATION IR SOLN
Status: DC | PRN
  Administered 2014-06-17: 1000 mL

## 2014-06-17 MED ORDER — FENTANYL CITRATE 0.05 MG/ML IJ SOLN
INTRAMUSCULAR | Status: AC
  Filled 2014-06-17: qty 2

## 2014-06-17 MED ORDER — SODIUM CHLORIDE 0.9 % IJ SOLN
INTRAMUSCULAR | Status: DC | PRN
  Administered 2014-06-17: 20 mL

## 2014-06-17 MED ORDER — WATER FOR IRRIGATION, STERILE IR SOLN
Status: DC | PRN
  Administered 2014-06-17: 1000 mL

## 2014-06-17 SURGICAL SUPPLY — 14 items
BLOCK BITE 60FR ADLT L/F BLUE (MISCELLANEOUS) ×3 IMPLANT
ELECT REM PT RETURN 9FT ADLT (ELECTROSURGICAL) ×3
ELECTRODE REM PT RTRN 9FT ADLT (ELECTROSURGICAL) ×2 IMPLANT
FORCEPS BIOP RAD 4 LRG CAP 4 (CUTTING FORCEPS) ×3 IMPLANT
FORMALIN 10 PREFIL 20ML (MISCELLANEOUS) ×6 IMPLANT
KIT CLEAN ENDO COMPLIANCE (KITS) ×3 IMPLANT
LUBRICANT JELLY 4.5OZ STERILE (MISCELLANEOUS) ×3 IMPLANT
MANIFOLD NEPTUNE II (INSTRUMENTS) ×3 IMPLANT
NEEDLE SCLEROTHERAPY 25GX240 (NEEDLE) ×3 IMPLANT
SNARE ROTATE MED OVAL 20MM (MISCELLANEOUS) ×3 IMPLANT
SYR 50ML LL SCALE MARK (SYRINGE) ×3 IMPLANT
TRAP SPECIMEN MUCOUS 40CC (MISCELLANEOUS) ×6 IMPLANT
TUBING IRRIGATION ENDOGATOR (MISCELLANEOUS) ×3 IMPLANT
WATER STERILE IRR 1000ML POUR (IV SOLUTION) ×3 IMPLANT

## 2014-06-17 NOTE — Anesthesia Postprocedure Evaluation (Signed)
  Anesthesia Post-op Note  Patient: Rhonda Garcia  Procedure(s) Performed: Procedure(s) with comments: ESOPHAGOGASTRODUODENOSCOPY (EGD) WITH PROPOFOL (N/A) ESOPHAGEAL DILATION (N/A) - [place] 55 Fr COLONOSCOPY WITH PROPOFOL Cecal withdrawal time=27minutes (N/A) POLYPECTOMY  Patient Location: PACU  Anesthesia Type:MAC  Level of Consciousness: awake, alert  and oriented  Airway and Oxygen Therapy: Patient Spontanous Breathing  Post-op Pain: none  Post-op Assessment: Post-op Vital signs reviewed, Patient's Cardiovascular Status Stable, Respiratory Function Stable, Patent Airway and No signs of Nausea or vomiting  Post-op Vital Signs: Reviewed and stable  Last Vitals:  Filed Vitals:   06/17/14 0940  BP: 110/51  Pulse:   Temp:   Resp: 16    Complications: No apparent anesthesia complications

## 2014-06-17 NOTE — Discharge Instructions (Addendum)
Colonoscopy Discharge Instructions  Read the instructions outlined below and refer to this sheet in the next few weeks. These discharge instructions provide you with general information on caring for yourself after you leave the hospital. Your doctor may also give you specific instructions. While your treatment has been planned according to the most current medical practices available, unavoidable complications occasionally occur. If you have any problems or questions after discharge, call Dr. Gala Romney at [phone]. ACTIVITY  You may resume your regular activity, but move at a slower pace for the next 24 hours.   Take frequent rest periods for the next 24 hours.   Walking will help get rid of the air and reduce the bloated feeling in your belly (abdomen).   No driving for 24 hours (because of the medicine (anesthesia) used during the test).    Do not sign any important legal documents or operate any machinery for 24 hours (because of the anesthesia used during the test).  NUTRITION  Drink plenty of fluids.   You may resume your normal diet as instructed by your doctor.   Begin with a light meal and progress to your normal diet. Heavy or fried foods are harder to digest and may make you feel sick to your stomach (nauseated).   Avoid alcoholic beverages for 24 hours or as instructed.  MEDICATIONS  You may resume your normal medications unless your doctor tells you otherwise.  WHAT YOU CAN EXPECT TODAY  Some feelings of bloating in the abdomen.   Passage of more gas than usual.   Spotting of blood in your stool or on the toilet paper.  IF YOU HAD POLYPS REMOVED DURING THE COLONOSCOPY:  No aspirin products for 7 days or as instructed.   No alcohol for 7 days or as instructed.   Eat a soft diet for the next 24 hours.  FINDING OUT THE RESULTS OF YOUR TEST Not all test results are available during your visit. If your test results are not back during the visit, make an appointment  with your caregiver to find out the results. Do not assume everything is normal if you have not heard from your caregiver or the medical facility. It is important for you to follow up on all of your test results.  SEEK IMMEDIATE MEDICAL ATTENTION IF:  You have more than a spotting of blood in your stool.   Your belly is swollen (abdominal distention).   You are nauseated or vomiting.   You have a temperature over 101.  You have abdominal pain or discomfort that is severe or gets worse throughout the day. EGD Discharge instructions Please read the instructions outlined below and refer to this sheet in the next few weeks. These discharge instructions provide you with general information on caring for yourself after you leave the hospital. Your doctor may also give you specific instructions. While your treatment has been planned according to the most current medical practices available, unavoidable complications occasionally occur. If you have any problems or questions after discharge, please call your doctor. ACTIVITY You may resume your regular activity but move at a slower pace for the next 24 hours.  Take frequent rest periods for the next 24 hours.  Walking will help expel (get rid of) the air and reduce the bloated feeling in your abdomen.  No driving for 24 hours (because of the anesthesia (medicine) used during the test).  You may shower.  Do not sign any important legal documents or operate any machinery for 24  hours (because of the anesthesia used during the test).  NUTRITION Drink plenty of fluids.  You may resume your normal diet.  Begin with a light meal and progress to your normal diet.  Avoid alcoholic beverages for 24 hours or as instructed by your caregiver.  MEDICATIONS You may resume your normal medications unless your caregiver tells you otherwise.  WHAT YOU CAN EXPECT TODAY You may experience abdominal discomfort such as a feeling of fullness or gas pains.   FOLLOW-UP Your doctor will discuss the results of your test with you.  SEEK IMMEDIATE MEDICAL ATTENTION IF ANY OF THE FOLLOWING OCCUR: Excessive nausea (feeling sick to your stomach) and/or vomiting.  Severe abdominal pain and distention (swelling).  Trouble swallowing.  Temperature over 101 F (37.8 C).  Rectal bleeding or vomiting of blood.     Resume Coumadin today.; Resume Lovenox shots March 12  Polyp and diverticulosis information provided  Further recommendations to follow pending review of pathology report    Colon Polyps Polyps are lumps of extra tissue growing inside the body. Polyps can grow in the large intestine (colon). Most colon polyps are noncancerous (benign). However, some colon polyps can become cancerous over time. Polyps that are larger than a pea may be harmful. To be safe, caregivers remove and test all polyps. CAUSES  Polyps form when mutations in the genes cause your cells to grow and divide even though no more tissue is needed. RISK FACTORS There are a number of risk factors that can increase your chances of getting colon polyps. They include:  Being older than 50 years.  Family history of colon polyps or colon cancer.  Long-term colon diseases, such as colitis or Crohn disease.  Being overweight.  Smoking.  Being inactive.  Drinking too much alcohol. SYMPTOMS  Most small polyps do not cause symptoms. If symptoms are present, they may include:  Blood in the stool. The stool may look dark red or black.  Constipation or diarrhea that lasts longer than 1 week. DIAGNOSIS People often do not know they have polyps until their caregiver finds them during a regular checkup. Your caregiver can use 4 tests to check for polyps:  Digital rectal exam. The caregiver wears gloves and feels inside the rectum. This test would find polyps only in the rectum.  Barium enema. The caregiver puts a liquid called barium into your rectum before taking X-rays of  your colon. Barium makes your colon look white. Polyps are dark, so they are easy to see in the X-ray pictures.  Sigmoidoscopy. A thin, flexible tube (sigmoidoscope) is placed into your rectum. The sigmoidoscope has a light and tiny camera in it. The caregiver uses the sigmoidoscope to look at the last third of your colon.  Colonoscopy. This test is like sigmoidoscopy, but the caregiver looks at the entire colon. This is the most common method for finding and removing polyps. TREATMENT  Any polyps will be removed during a sigmoidoscopy or colonoscopy. The polyps are then tested for cancer. PREVENTION  To help lower your risk of getting more colon polyps:  Eat plenty of fruits and vegetables. Avoid eating fatty foods.  Do not smoke.  Avoid drinking alcohol.  Exercise every day.  Lose weight if recommended by your caregiver.  Eat plenty of calcium and folate. Foods that are rich in calcium include milk, cheese, and broccoli. Foods that are rich in folate include chickpeas, kidney beans, and spinach. HOME CARE INSTRUCTIONS Keep all follow-up appointments as directed by your caregiver. You  may need periodic exams to check for polyps. SEEK MEDICAL CARE IF: You notice bleeding during a bowel movement. Document Released: 12/21/2003 Document Revised: 06/18/2011 Document Reviewed: 06/05/2011 [facility] Patient Information 2015 [place], [place]. This information is not intended to replace advice given to you by your health care provider. Make sure you discuss any questions you have with your health care provider.   Diverticulosis Diverticulosis is the condition that develops when small pouches (diverticula) form in the wall of your colon. Your colon, or large intestine, is where water is absorbed and stool is formed. The pouches form when the inside layer of your colon pushes through weak spots in the outer layers of your colon. CAUSES  No one knows exactly what causes diverticulosis. RISK  FACTORS  Being older than 61. Your risk for this condition increases with age. Diverticulosis is rare in people younger than 40 years. By age 64, almost everyone has it.  Eating a low-fiber diet.  Being frequently constipated.  Being overweight.  Not getting enough exercise.  Smoking.  Taking over-the-counter pain medicines, like aspirin and ibuprofen. SYMPTOMS  Most people with diverticulosis do not have symptoms. DIAGNOSIS  Because diverticulosis often has no symptoms, health care providers often discover the condition during an exam for other colon problems. In many cases, a health care provider will diagnose diverticulosis while using a flexible scope to examine the colon (colonoscopy). TREATMENT  If you have never developed an infection related to diverticulosis, you may not need treatment. If you have had an infection before, treatment may include:  Eating more fruits, vegetables, and grains.  Taking a fiber supplement.  Taking a live bacteria supplement (probiotic).  Taking medicine to relax your colon. HOME CARE INSTRUCTIONS   Drink at least 6-8 glasses of water each day to prevent constipation.  Try not to strain when you have a bowel movement.  Keep all follow-up appointments. If you have had an infection before:  Increase the fiber in your diet as directed by your health care provider or dietitian.  Take a dietary fiber supplement if your health care provider approves.  Only take medicines as directed by your health care provider. SEEK MEDICAL CARE IF:   You have abdominal pain.  You have bloating.  You have cramps.  You have not gone to the bathroom in 3 days. SEEK IMMEDIATE MEDICAL CARE IF:   Your pain gets worse.  Yourbloating becomes very bad.  You have a fever or chills, and your symptoms suddenly get worse.  You begin vomiting.  You have bowel movements that are bloody or black. MAKE SURE YOU:  Understand these instructions.  Will  watch your condition.  Will get help right away if you are not doing well or get worse. Document Released: 12/22/2003 Document Revised: 03/31/2013 Document Reviewed: 02/18/2013 [facility] Patient Information 2015 [place], [place]. This information is not intended to replace advice given to you by your health care provider. Make sure you discuss any questions you have with your health care provider.

## 2014-06-17 NOTE — OR Nursing (Signed)
CBG 119 

## 2014-06-17 NOTE — H&P (View-Only) (Signed)
Please let patient know that Dr. Gala Romney agrees to EGD/ED/TCS (dx:ruq pain, esophageal dysphagia, change in bowels, h/o tubular adenomas).  -Days of clear liquids: metformin 500mg  at breakfast. -Please do two days of clear liquids.  -Split dose trilyte or moviprep. -We need to find out of [name] Eckard PharmMD at [employer] if we can hold coumadin for four days before procedure.  -Recommend deep sedation in OR this time due to polypharmacy.  -Please make sure patient is have regular BMs on miralax prior to bowel prep.

## 2014-06-17 NOTE — H&P (View-Only) (Signed)
Primary Care Physician:  Sharion Balloon, FNP  Primary Gastroenterologist:  Garfield Cornea, MD   Chief Complaint  Patient presents with  . set up TCS    HPI:  Rhonda Garcia is a 74 y.o. female here for further evaluation of bowel issues, ruq pain. Last seen in 2012 at which time she was having a lot of problems with weight loss, abnormal distal ileal thickening, reflux, dysphagia, constipation. Workup at that time included an EGD and colonoscopy, she had a normal-appearing esophagus status post [facility] dilator, small hiatal hernia, trivial antral and bulbar erosions. Ileocolonoscopy revealed colonic diverticulosis, colon tubular adenomas. Is notable back in 2009 she had a colonoscopy for diarrhea with random colon biopsies showing nonspecific cryptitis.  Patient admits that her bowel function fluctuates from constipation to diarrhea chronically. Tends to be more constipation predominant. At times will have fecal urgency associated with incontinence. Feels like all of her bowel movements moved to her rectum and sit. Then she has a difficulty passing on. Sees bright red blood per rectum. Denies melena. Takes Dulcolax a couple times per month. Has not used MiraLAX in several months. Previously was on Amitiza but was too expensive, this was a couple years ago.  Over the past one year, she feels discomfort in the right upper quadrant which she describes as severe cramp that lasts for minutes at a time. Used to noted when she bent over to tie her shoes. Now it happens any time. Denies any association with food. Seems to be unrelated to bowel function. Complains of heartburn but reports is better. Complains of dysphagia to meats, peels, bread. States she recently lost 18 pounds in 18 days on some type of diet pill. This was about 1 month ago. She has gained 5-8 pounds back.    Current Outpatient Prescriptions  Medication Sig Dispense Refill  . Alcohol Swabs 70 % PADS by Does not apply route 2 (two) times  daily.      Marland Kitchen amLODipine (NORVASC) 5 MG tablet Take 1 tablet (5 mg total) by mouth daily. 90 tablet 3  . Blood Glucose Monitoring Suppl (GLUCOCOM MONITOR) W/DEVICE KIT by Does not apply route 2 (two) times daily.      Marland Kitchen glucose blood test strip 1 each by Other route 2 (two) times daily. Use as instructed     . HYDROcodone-acetaminophen (LORCET) 5-325 MG per tablet Take 1 tablet by mouth every 6 (six) hours as needed. 60 tablet 0  . isosorbide mononitrate (IMDUR) 30 MG 24 hr tablet Take 1 tablet (30 mg total) by mouth daily. 90 tablet 3  . ketoconazole (NIZORAL) 2 % cream APPLY ONCE DAILY 60 g 2  . Lancets MISC by Does not apply route 2 (two) times daily.      Marland Kitchen lisinopril-hydrochlorothiazide (PRINZIDE,ZESTORETIC) 20-12.5 MG per tablet Take 2 tablets by mouth daily. 180 tablet 6  . LORazepam (ATIVAN) 0.5 MG tablet TAKE ONE TABLET EVERY 12 HOURS 60 tablet 0  . metFORMIN (GLUCOPHAGE) 500 MG tablet Take 2 tablets (1,000 mg total) by mouth daily with breakfast. 180 tablet 3  . metoprolol (LOPRESSOR) 50 MG tablet Take 1 tablet (50 mg total) by mouth 2 (two) times daily. 180 tablet 3  . mirabegron ER (MYRBETRIQ) 25 MG TB24 tablet Take 1 tablet (25 mg total) by mouth daily. 90 tablet 3  . mupirocin ointment (BACTROBAN) 2 % APPLY INTO THE NOSE TWICE DAILY 22 g 2  . nitroGLYCERIN (NITROSTAT) 0.4 MG SL tablet Place 0.4 mg under the tongue  every 5 (five) minutes as needed.      . NON FORMULARY Take 1,500 mg by mouth every other day. Lipozene    . pantoprazole (PROTONIX) 40 MG tablet Take 1 tablet (40 mg total) by mouth daily. 30 tablet 5  . pravastatin (PRAVACHOL) 80 MG tablet Take 1 tablet (80 mg total) by mouth daily. 90 tablet 3  . ranitidine (ZANTAC) 150 MG tablet Take 1 tablet (150 mg total) by mouth 2 (two) times daily. 180 tablet 3  . sertraline (ZOLOFT) 50 MG tablet Take 1 tablet (50 mg total) by mouth daily. 90 tablet 3  . Vitamin D, Ergocalciferol, (DRISDOL) 50000 UNITS CAPS capsule Take 1 capsule  (50,000 Units total) by mouth every 7 (seven) days. 30 capsule 6  . warfarin (COUMADIN) 5 MG tablet Take 1 tablet (5 mg total) by mouth daily. 90 tablet 1   No current facility-administered medications for this visit.    Allergies as of 05/26/2014 - Review Complete 05/26/2014  Allergen Reaction Noted  . Promethazine hcl Anaphylaxis 09/06/2010  . Penicillins  03/13/2011  . Promethazine hcl Other (See Comments)     Past Medical History  Diagnosis Date  . Diabetes mellitus   . CAD (coronary artery disease)   . MI (myocardial infarction)   . GERD (gastroesophageal reflux disease)   . Osteopenia   . Hypertension   . Hyperlipidemia   . Anal fissure     resolved   . Chronic pain   . Osteoarthritis   . DDD (degenerative disc disease)   . Anemia   . Airway compromise     Inability to tolerate continuous positive airway pressure  . Hiatal hernia   . Dyslipidemia   . Sleep apnea   . Osteoporosis   . Thyroid disease   . Hearing loss   . Generalized headaches   . Sebaceous cyst     on back  . Back pain     with left radiculopathy  . Hx of myocardial infarction   . Depression   . Hx of peptic ulcer   . A-fib     Past Surgical History  Procedure Laterality Date  . Knee surgery    . Appendectomy    . Breast reduction surgery    . Esophagogastroduodenoscopy  06/2007    Small hiatal hernia  . Colonoscopy  06/2007    Friable anal canal and anal papillae. Pancolonic diverticula. Normal terminal ileum. Status post segmental biopsy, descending colon biopsy showed minimal cryptitis. Biopsies felt to be  nonspecific but could be seen with NSAIDs. No features of inflammatory bowel disease. Stool studies were negative.  . Colonoscopy  03/21/2011    RMR: colonic polyps treated as described above, colonic diverticulosis (Tubular adenomas)  . Cholecystectomy  2008 - approximate  . Abdominal hysterectomy  2001 - approximate  . Lipoma excision  03/17/2012    Procedure: EXCISION LIPOMA;   Surgeon: Gwenyth Ober, MD;  Location: [place];  Service: General;  Laterality: Right;  excision of right lower back lipoma  . Coronary angioplasty with stent placement    . Esophagogastroduodenoscopy  03/21/2011    RMR: normal esophagus- status post passage of Maloney dilator. Small hiatal hernia. Trivial antral and bulbar erosions    Family History  Problem Relation Age of Onset  . Heart attack Mother   . Hypertension Mother   . Stroke Mother     Deceased, age 39  . Diabetes Sister   . Diabetes Sister   .  Cancer Sister     ovarian  . Stroke Sister   . Diabetes Brother     also has a blood disorder   . Clotting disorder Brother   . Melanoma Brother     deceased  . Coronary artery disease Other   . Kidney disease Other   . Colon cancer Neg Hx     History   Social History  . Marital Status: Married    Spouse Name: N/A  . Number of Children: 3  . Years of Education: N/A   Occupational History  . Retired    Social History Main Topics  . Smoking status: Former Smoker -- 0.50 packs/day for 0 years    Types: Cigarettes    Quit date: 03/13/1981  . Smokeless tobacco: Never Used     Comment: quit 30 +years ago  . Alcohol Use: No  . Drug Use: No  . Sexual Activity: No   Other Topics Concern  . Not on file   Social History Narrative   Married      ROS:  General: Negative for anorexia, weight loss, fever, chills, fatigue, weakness. Eyes: Negative for vision changes.  ENT: Negative for hoarseness, difficulty swallowing , nasal congestion. CV: Negative for chest pain, angina, palpitations, dyspnea on exertion, peripheral edema.  Respiratory: Negative for dyspnea at rest, dyspnea on exertion, cough, sputum, wheezing.  GI: See history of present illness. GU:  Negative for dysuria, hematuria, urinary incontinence, urinary frequency, nocturnal urination.  MS: Negative for joint pain, low back pain.  Derm: Negative for rash or itching.  Neuro:  Negative for weakness, abnormal sensation, seizure, frequent headaches, memory loss, confusion.  Psych: Negative for anxiety, depression, suicidal ideation, hallucinations.  Endo: Negative for unusual weight change.  Heme: Negative for bruising or bleeding. Allergy: Negative for rash or hives.    Physical Examination:  BP 141/69 mmHg  Pulse 65  Temp(Src) 97.2 F (36.2 C)  Ht 5' 2"  (1.575 m)  Wt 190 lb 9.6 oz (86.456 kg)  BMI 34.85 kg/m2   General: Well-nourished, well-developed in no acute distress.  Head: Normocephalic, atraumatic.   Eyes: Conjunctiva pink, no icterus. Mouth: Oropharyngeal mucosa moist and pink , no lesions erythema or exudate. Neck: Supple without thyromegaly, masses, or lymphadenopathy.  Lungs: Clear to auscultation bilaterally.  Heart: Regular rate and rhythm, no murmurs rubs or gallops.  Abdomen: Bowel sounds are normal, mild RUQ pain with deep palpation, nondistended, no hepatosplenomegaly or masses, no abdominal bruits or    hernia , no rebound or guarding.  Obese. Exam limited. Rectal: not performed Extremities: No lower extremity edema. No clubbing or deformities.  Neuro: Alert and oriented x 4 , grossly normal neurologically.  Skin: Warm and dry, no rash or jaundice.   Psych: Alert and cooperative, normal mood and affect.  Labs: Lab Results  Component Value Date   HGBA1C 6.6% 05/20/2014   Lab Results  Component Value Date   CREATININE 0.87 05/20/2014   BUN 15 05/20/2014   NA 144 05/20/2014   K 4.3 05/20/2014   CL 102 05/20/2014   CO2 26 05/20/2014   Lab Results  Component Value Date   ALT 19 05/20/2014   AST 19 05/20/2014   ALKPHOS 75 05/20/2014   BILITOT 0.6 05/20/2014     Hgb 13 on 04/28/14  Imaging Studies: Dg Abd 1 View  04/28/2014   CLINICAL DATA:  Right upper quadrant pain.  EXAM: ABDOMEN - 1 VIEW  COMPARISON:  CT 09/21/2010  FINDINGS: Prior cholecystectomy.  There is a non obstructive bowel gas pattern. No supine evidence of  free air. No organomegaly or suspicious calcification.No acute bony abnormality.  IMPRESSION: No acute findings.   Electronically Signed   By: Rolm Baptise M.D.   On: 04/28/2014 12:24

## 2014-06-17 NOTE — Op Note (Signed)
[facility] [address] [place], [address]   COLONOSCOPY PROCEDURE REPORT  PATIENT: Rhonda Garcia, Rhonda Garcia  MR#: [id number] BIRTHDATE: 1940/11/30 , 73  yrs. old GENDER: female ENDOSCOPIST: R.  Garfield Cornea, MD FACP [facility] REFERRED KZ:LDJTTS Laurance Flatten, M.D. PROCEDURE DATE:  Jul 13, 2014 PROCEDURE:   Colonoscopy with snare polypectomy and Colonoscopy with biopsy INDICATIONS:History of colonic adenoma. MEDICATIONS: deep sedation per Dr.  [facility] ASA CLASS:       Class III  CONSENT: The risks, benefits, alternatives and imponderables including but not limited to bleeding, perforation as well as the possibility of a missed lesion have been reviewed.  The potential for biopsy, lesion removal, etc. have also been discussed. Questions have been answered.  All parties agreeable.  Please see the history and physical in the medical record for more information.  DESCRIPTION OF PROCEDURE:   After the risks benefits and alternatives of the procedure were thoroughly explained, informed consent was obtained.  The digital rectal exam      The endoscope was introduced through the anus and advanced to the   . No adverse events experienced.   The quality of the prep was adequate  The instrument was then slowly withdrawn as the colon was fully examined.      COLON FINDINGS: Minimal internal hemorrhoids; otherwise, normal rectum.  Redundant colon.  Colonic diverticulosis.  (1) 5 mm polyp at the splenic flexure; (1) 5 mm in the mid ascending segment; there was (1) 12 mm x 5 mm carpet polyp in between 2 folds in the ascending segment as well. The remainder of the colonic mucosa appeared normal.  The distal 5 cm of terminal ileal mucosa also appeared normal.  The 2 smaller polyps were cold biopsy and cold snare removed, respectively.  The carpet polyp in ascending segment was difficult to get to.  I tried retroflexing in the ascending colon but it was too proximal.  I elected  to inject normal saline proximal to the polyp to lift it up for better access..  I utilized 20 mL of normal saline, however, only about 10 mL (net) was used effectively to lift up the polyp away from the mucosa.This was done with  partial success.  I Was able to grasp this polyp with the snare and removed it with 2 passes; there was a minimal amount of peripheral polyp tissue in the valley of the 2 folds which I ablated with the tip of a hot snare loop.  Multiple fragments retrieved for the pathologist. Retroflexion was performed. .  Withdrawal time=29 minutes 0 seconds.  The scope was withdrawn and the procedure completed. COMPLICATIONS: There were no immediate complications.  ENDOSCOPIC IMPRESSION: Colonic diverticulosis.  Multiple colonic polyps removed as described above  RECOMMENDATIONS: Follow-up on pathology. Resume Coumadin today; resume Lovenox bridging on March 12; see EGD report.  eSigned:  R. Garfield Cornea, MD [name] 13-Jul-2014 10:55 AM   cc:  CPT CODES: ICD CODES:  The ICD and CPT codes recommended by this software are interpretations from the data that the clinical staff has captured with the software.  The verification of the translation of this report to the ICD and CPT codes and modifiers is the sole responsibility of the health care institution and practicing physician where this report was generated.  [employer] [place]. will not be held responsible for the validity of the ICD and CPT codes included on this report.  AMA assumes no liability for data contained or not contained herein. CPT is  a Designer, television/film set of the [employer].  PATIENT NAME:  Rhonda Garcia, Rhonda Garcia MR#: [id number]

## 2014-06-17 NOTE — Op Note (Signed)
[facility] [address] [place], [address]   ENDOSCOPY PROCEDURE REPORT  PATIENT: Rhonda Garcia, Rhonda Garcia  MR#: [id number] BIRTHDATE: 17-Nov-1940 , 73  yrs. old GENDER: female ENDOSCOPIST: R.  Garfield Cornea, MD FACP [facility] REFERRED BY:  Redge Gainer, M.D. PROCEDURE DATE:  07/15/2014 PROCEDURE:  EGD, diagnostic and Maloney dilation of esophagus INDICATIONS:  Esophageal dysphagia; right upper quadrant abdominal pain. MEDICATIONS: deep sedation per Dr.  [facility] ASA CLASS:      Class III  CONSENT: The risks, benefits, limitations, alternatives and imponderables have been discussed.  The potential for biopsy, esophogeal dilation, etc. have also been reviewed.  Questions have been answered.  All parties agreeable.  Please see the history and physical in the medical record for more information.  DESCRIPTION OF PROCEDURE: After the risks benefits and alternatives of the procedure were thoroughly explained, informed consent was obtained.  The    endoscope was introduced through the mouth and advanced to the second portion of the duodenum , limited by Without limitations. The instrument was slowly withdrawn as the mucosa was fully examined.    Normal-appearing, patent tubular esophagus.  Stomach empty. Normal-appearing gastric mucosa.  1 cm hiatal hernia.  Patent pylorus.  Normal-appearing first and second portion of the duodenum.  Scope was withdrawn and a 54 [place] Maloney dilator was passed easily.  A look back revealed a superficial tear through the upper esophageal sphincter mucosa otherwise, no complication seen related to this maneuver.  Retroflexed views revealed a hiatal hernia. The scope was then withdrawn from the patient and the procedure completed.  COMPLICATIONS: There were no immediate complications.  ENDOSCOPIC IMPRESSION: Small hiatal hernia; otherwise normal EGD. Status post passage of a Maloney dilator. No explanation for patient's  right upper quadrant abdominal pain.  RECOMMENDATIONS: See colonoscopy report. Further recommendations to follow. Continue Protonix 40 mg daily  REPEAT EXAM:  eSigned:  R. Garfield Cornea, MD [facility]ss[facility] 07/15/14 10:06 AM    CC:  CPT CODES: ICD CODES:  The ICD and CPT codes recommended by this software are interpretations from the data that the clinical staff has captured with the software.  The verification of the translation of this report to the ICD and CPT codes and modifiers is the sole responsibility of the health care institution and practicing physician where this report was generated.  [place]. will not be held responsible for the validity of the ICD and CPT codes included on this report.  AMA assumes no liability for data contained or not contained herein. CPT is a Designer, television/film set of the [employer].  PATIENT NAME:  Rhonda Garcia, Rhonda Garcia MR#: [id number]

## 2014-06-17 NOTE — Anesthesia Preprocedure Evaluation (Signed)
Anesthesia Evaluation  Patient identified by MRN, date of birth, ID band Patient awake    Reviewed: Allergy & Precautions, H&P , NPO status , Patient's Chart, lab work & pertinent test results, reviewed documented beta blocker date and time   Airway Mallampati: I  TM Distance: >3 FB Neck ROM: Full    Dental  (+) Edentulous Upper   Pulmonary sleep apnea , former smoker,  breath sounds clear to auscultation        Cardiovascular hypertension, Pt. on medications and Pt. on home beta blockers + CAD, + Past MI and + Cardiac Stents Rhythm:Regular Rate:Normal     Neuro/Psych  Headaches, PSYCHIATRIC DISORDERS Depression  Neuromuscular disease    GI/Hepatic hiatal hernia, GERD-  Medicated,  Endo/Other  diabetes, Well Controlled, Type 1  Renal/GU      Musculoskeletal negative musculoskeletal ROS (+)   Abdominal   Peds  Hematology negative hematology ROS (+)   Anesthesia Other Findings   Reproductive/Obstetrics                             Anesthesia Physical Anesthesia Plan  ASA: III  Anesthesia Plan: MAC   Post-op Pain Management:    Induction: Intravenous  Airway Management Planned: Simple Face Mask  Additional Equipment:   Intra-op Plan:   Post-operative Plan:   Informed Consent: I have reviewed the patients History and Physical, chart, labs and discussed the procedure including the risks, benefits and alternatives for the proposed anesthesia with the patient or authorized representative who has indicated his/her understanding and acceptance.     Plan Discussed with:   Anesthesia Plan Comments:         Anesthesia Quick Evaluation

## 2014-06-17 NOTE — Interval H&P Note (Signed)
History and Physical Interval Note:  06/17/2014 9:33 AM  Rhonda Garcia  has presented today for surgery, with the diagnosis of RUQ pain, esophageal dysphagia, change in bowel habits, h/o adenomas  The various methods of treatment have been discussed with the patient and family. After consideration of risks, benefits and other options for treatment, the patient has consented to  Procedure(s): ESOPHAGOGASTRODUODENOSCOPY (EGD) WITH PROPOFOL (N/A) ESOPHAGEAL DILATION (N/A) COLONOSCOPY WITH PROPOFOL (N/A) as a surgical intervention .  The patient's history has been reviewed, patient examined, no change in status, stable for surgery.  I have reviewed the patient's chart and labs.  Questions were answered to the patient's satisfaction.     Rhonda Garcia  Patient with out change in right upper quadrant abdominal pain. States she is constipated the majority of the time having diarrhea only about 2 times monthly. Lovenox and Coumadin has been held per plan. EGD with esophageal dilation and surveillance colonoscopy per plan.The risks, benefits, limitations, imponderables and alternatives regarding both EGD and colonoscopy have been reviewed with the patient. Questions have been answered. All parties agreeable.

## 2014-06-17 NOTE — Transfer of Care (Signed)
Immediate Anesthesia Transfer of Care Note  Patient: [facility]  Procedure(s) Performed: Procedure(s) with comments: ESOPHAGOGASTRODUODENOSCOPY (EGD) WITH PROPOFOL (N/A) ESOPHAGEAL DILATION (N/A) - Maloney 54 Fr COLONOSCOPY WITH PROPOFOL Cecal withdrawal time=12minutes (N/A) POLYPECTOMY  Patient Location: PACU  Anesthesia Type:MAC  Level of Consciousness: awake  Airway & Oxygen Therapy: Patient Spontanous Breathing and Patient connected to nasal cannula oxygen  Post-op Assessment: Report given to RN  Post vital signs: Reviewed and stable  Last Vitals:  Filed Vitals:   06/17/14 0940  BP: 110/51  Pulse:   Temp:   Resp: 16    Complications: No apparent anesthesia complications

## 2014-06-17 NOTE — Interval H&P Note (Signed)
History and Physical Interval Note:  06/17/2014 9:40 AM  Rhonda Garcia  has presented today for surgery, with the diagnosis of RUQ pain, esophageal dysphagia, change in bowel habits, h/o adenomas  The various methods of treatment have been discussed with the patient and family. After consideration of risks, benefits and other options for treatment, the patient has consented to  Procedure(s): ESOPHAGOGASTRODUODENOSCOPY (EGD) WITH PROPOFOL (N/A) ESOPHAGEAL DILATION (N/A) COLONOSCOPY WITH PROPOFOL (N/A) as a surgical intervention .  The patient's history has been reviewed, patient examined, no change in status, stable for surgery.  I have reviewed the patient's chart and labs.  Questions were answered to the patient's satisfaction.     [name]

## 2014-06-18 ENCOUNTER — Ambulatory Visit: Payer: Self-pay

## 2014-06-18 ENCOUNTER — Encounter (HOSPITAL_COMMUNITY): Payer: Self-pay | Admitting: Internal Medicine

## 2014-06-21 ENCOUNTER — Encounter: Payer: Self-pay | Admitting: Internal Medicine

## 2014-06-22 ENCOUNTER — Encounter: Payer: Self-pay | Admitting: Internal Medicine

## 2014-07-01 ENCOUNTER — Encounter: Payer: Self-pay | Admitting: Family Medicine

## 2014-07-01 ENCOUNTER — Encounter: Payer: Self-pay | Admitting: *Deleted

## 2014-07-01 ENCOUNTER — Ambulatory Visit (INDEPENDENT_AMBULATORY_CARE_PROVIDER_SITE_OTHER): Payer: Medicare Other

## 2014-07-01 ENCOUNTER — Ambulatory Visit (INDEPENDENT_AMBULATORY_CARE_PROVIDER_SITE_OTHER): Payer: Medicare Other | Admitting: Family Medicine

## 2014-07-01 ENCOUNTER — Ambulatory Visit: Payer: Medicare Other

## 2014-07-01 VITALS — BP 157/66 | HR 64 | Temp 97.0°F | Ht 62.0 in | Wt 191.0 lb

## 2014-07-01 DIAGNOSIS — I480 Paroxysmal atrial fibrillation: Secondary | ICD-10-CM

## 2014-07-01 DIAGNOSIS — M79642 Pain in left hand: Secondary | ICD-10-CM

## 2014-07-01 DIAGNOSIS — M25532 Pain in left wrist: Secondary | ICD-10-CM

## 2014-07-01 DIAGNOSIS — S40021S Contusion of right upper arm, sequela: Secondary | ICD-10-CM

## 2014-07-01 DIAGNOSIS — S0512XD Contusion of eyeball and orbital tissues, left eye, subsequent encounter: Secondary | ICD-10-CM

## 2014-07-01 DIAGNOSIS — M79641 Pain in right hand: Secondary | ICD-10-CM

## 2014-07-01 DIAGNOSIS — S8001XD Contusion of right knee, subsequent encounter: Secondary | ICD-10-CM | POA: Diagnosis not present

## 2014-07-01 DIAGNOSIS — M25531 Pain in right wrist: Secondary | ICD-10-CM

## 2014-07-01 LAB — POCT INR: INR: 1.6

## 2014-07-01 NOTE — Patient Instructions (Addendum)
Anticoagulation Dose Instructions as of 07/01/2014      Rhonda Garcia Tue Wed Thu Fri Sat   New Dose 5 mg 5 mg 2.5 mg 5 mg 5 mg 5 mg 2.5 mg    Description        Take 1 tablet today - Thursday, March 24th.  Then increase warfarin dose to 5mg  - 1/2 tablet on tuesdays and saturdays.  Take 1 tablet all other days.     INR was 1.6 today  The patient should limit her physical activity. She should use cold compresses on her face knee arm and all the sore areas for at least another 48 hours. After that time she can start using warm wet compresses. She should do this for 20 minutes at a time. 3 or 4 times daily. She should only take extra strength Tylenol 3 or 4 daily Until the stiffness and soreness is resolved she should refrain from lifting pushing or pulling anyone. We will recheck her in a couple weeks and hopefully allow her to go back to her usual work.

## 2014-07-01 NOTE — Progress Notes (Signed)
Subjective:    Patient ID: Rhonda Garcia, female    DOB: Jul 14, 1940, 74 y.o.   MRN: [id number]  HPI Patient here today for a fall that happened yesterday morning about 5 am. She fell down on her hands. She also hit the left side of her face and head. She went to [facility] when the fall happened and a CT of her head was done there. All of these x-rays were performed and reviewed and they will be reviewed with the patient today. X-rays of the left hand showed moderate degenerative changes in the DIP joints but no acute abnormality x-rays of the right forearm showed no fracture or acute abnormality. X-rays of the left shoulder showed some mild degenerative changes without acute abnormality. X-rays of the right knee showed no acute findings fracture or joint effusion. A CT of the head showed no hemorrhages or abnormalities and was essentially within normal limits. A CT of the cervical spine likewise showed incidental degenerative changes without acute abnormality.         Patient Active Problem List   Diagnosis Date Noted  . Hiatal hernia   . Dysphagia, pharyngoesophageal phase   . History of colonic polyps   . Diverticulosis of colon without hemorrhage   . Rectal bleeding 05/26/2014  . RUQ pain 05/26/2014  . Paroxysmal atrial fibrillation 11/02/2013  . Long term (current) use of anticoagulants 11/02/2013  . Tinea corporis 10/28/2012  . Depression 10/28/2012  . Diabetes mellitus type 2, uncomplicated 95/63/8756  . Lipoma of back 02/12/2012  . Constipation 02/28/2011  . Esophageal dysphagia 02/28/2011  . Gastroesophageal reflux 02/28/2011  . Early satiety 02/28/2011  . POSITIONAL VERTIGO 04/29/2009  . Osteoarthritis 12/23/2008  . Hyperlipidemia 12/21/2008  . Essential hypertension 12/21/2008  . CAD, NATIVE VESSEL 12/21/2008   Outpatient Encounter Prescriptions as of 07/01/2014  Medication Sig  . amLODipine (NORVASC) 5 MG tablet Take 1 tablet (5 mg total) by mouth daily.    Marland Kitchen enoxaparin (LOVENOX) 80 MG/0.8ML injection Inject 0.8 mLs (80 mg total) into the skin every 12 (twelve) hours.  . Hydrocodone-Acetaminophen 5-300 MG TABS Take 1 tablet by mouth 4 (four) times daily as needed.  . isosorbide mononitrate (IMDUR) 30 MG 24 hr tablet Take 1 tablet (30 mg total) by mouth daily.  Marland Kitchen ketoconazole (NIZORAL) 2 % cream APPLY ONCE DAILY  . lisinopril-hydrochlorothiazide (PRINZIDE,ZESTORETIC) 20-12.5 MG per tablet Take 2 tablets by mouth daily.  Marland Kitchen LORazepam (ATIVAN) 0.5 MG tablet TAKE ONE TABLET EVERY 12 HOURS  . metFORMIN (GLUCOPHAGE) 500 MG tablet Take 2 tablets (1,000 mg total) by mouth daily with breakfast.  . metoprolol (LOPRESSOR) 50 MG tablet Take 1 tablet (50 mg total) by mouth 2 (two) times daily.  . mirabegron ER (MYRBETRIQ) 25 MG TB24 tablet Take 1 tablet (25 mg total) by mouth daily.  . mupirocin ointment (BACTROBAN) 2 % APPLY INTO THE NOSE TWICE DAILY  . nitroGLYCERIN (NITROSTAT) 0.4 MG SL tablet Place 0.4 mg under the tongue every 5 (five) minutes as needed.    . pantoprazole (PROTONIX) 40 MG tablet Take 1 tablet (40 mg total) by mouth daily.  . polyethylene glycol powder (GLYCOLAX/MIRALAX) powder Take one capful twice daily until soft stool, then once at bedtime as needed.  . polyethylene glycol-electrolytes (NULYTELY/GOLYTELY) 420 G solution Take 4,000 mLs by mouth once.  . pravastatin (PRAVACHOL) 80 MG tablet Take 1 tablet (80 mg total) by mouth daily.  . ranitidine (ZANTAC) 150 MG tablet Take 1 tablet (150 mg total) by mouth  2 (two) times daily.  . sertraline (ZOLOFT) 50 MG tablet Take 1 tablet (50 mg total) by mouth daily.  . Vitamin D, Ergocalciferol, (DRISDOL) 50000 UNITS CAPS capsule Take 1 capsule (50,000 Units total) by mouth every 7 (seven) days.  Marland Kitchen warfarin (COUMADIN) 5 MG tablet Take 1 tablet (5 mg total) by mouth daily.  . [DISCONTINUED] HYDROcodone-acetaminophen (LORCET) 5-325 MG per tablet Take 1 tablet by mouth every 6 (six) hours as needed.     Review of Systems  Constitutional: Negative.   HENT: Negative.   Eyes: Negative.   Respiratory: Negative.   Cardiovascular: Negative.   Gastrointestinal: Negative.   Endocrine: Negative.   Genitourinary: Negative.   Musculoskeletal: Positive for myalgias and arthralgias (bilateral hands, wrist and face).  Skin:       Multiple bruises and contusions.   Allergic/Immunologic: Negative.   Neurological: Negative.   Hematological: Negative.   Psychiatric/Behavioral: Negative.        Objective:   Physical Exam  Constitutional: She is oriented to person, place, and time. She appears well-developed and well-nourished.  Patient comes to the visit with one of her relatives. She is in good spirits despite the peri orbital contusion on the left and the severe joint pain in both hands  HENT:  Head: Normocephalic and atraumatic.  Right Ear: External ear normal.  Left Ear: External ear normal.  Nose: Nose normal.  Mouth/Throat: Oropharynx is clear and moist.  She has bruising around the left orbit but vision is not impaired.  Eyes: Conjunctivae and EOM are normal. Pupils are equal, round, and reactive to light. Right eye exhibits no discharge. Left eye exhibits no discharge. No scleral icterus.  Neck: Normal range of motion. Neck supple. No thyromegaly present.  Neck has good mobility  Cardiovascular: Normal rate, regular rhythm and normal heart sounds.   No murmur heard. Pulmonary/Chest: Effort normal and breath sounds normal. No respiratory distress. She has no wheezes. She has no rales. She exhibits no tenderness.  Abdominal: Soft. Bowel sounds are normal. She exhibits no mass. There is no tenderness. There is no rebound and no guarding.  Musculoskeletal: Normal range of motion. She exhibits no edema or tenderness.  The patient has joint stiffness in both hands and tenderness with palpation. There is DIP joint swelling in both hands. The right forearm is tender to palpation but she has  full movement of this. She has good leg raising bilaterally and reflexes are normal. She has an abrasion on the right knee and slight tenderness on the anterior right knee.  Lymphadenopathy:    She has no cervical adenopathy.  Neurological: She is alert and oriented to person, place, and time. She has normal reflexes. No cranial nerve deficit.  Skin: Skin is warm and dry. No rash noted.  Psychiatric: She has a normal mood and affect. Her behavior is normal. Judgment and thought content normal.  Nursing note and vitals reviewed.  BP 157/6 mmHg  Pulse 64  Temp(Src) 97 F (36.1 C) (Oral)  Ht 5\' 2"  (1.575 m)  Wt 191 lb (86.637 kg)  BMI 34.93 kg/m2   [facility] reading (PRIMARY) by  Dr. Governor Rooks hand--no acute sheet findings to severe DIP arthritis                                       Assessment & Plan:  1. Bilateral wrist pain -There is tenderness in both hands especially  in the DIP joints with swelling.  2. Bilateral hand pain -There is DIP joint swelling and this seems to be where most of her pain is coming from. It is also some wrist pain. 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 Paroxysmal atrial fibrillation -This is stable and the patient is currently not having any problems with this. - POCT INR  4. Contusion, orbital tissues, left, subsequent encounter -The patient has left orbital bruising and no significant tenderness and CT scan did not show any fracture  5. Arm contusion, right, sequela  6. Knee contusion, right, subsequent encounter -There was also an abrasion but patient has good flexion and extension.  Patient Instructions   Anticoagulation Dose Instructions as of 07/01/2014      Dorene Grebe Tue Wed Thu Fri Sat   New Dose 5 mg 5 mg 2.5 mg 5 mg 5 mg 5 mg 2.5 mg    Description        Take 1 tablet today - Thursday, March 24th.  Then increase warfarin dose to 5mg  -  1/2 tablet on tuesdays and saturdays.  Take 1 tablet all other days.     INR was 1.6 today  The patient should limit her physical activity. She should use cold compresses on her face knee arm and all the sore areas for at least another 48 hours. After that time she can start using warm wet compresses. She should do this for 20 minutes at a time. 3 or 4 times daily. She should only take extra strength Tylenol 3 or 4 daily Until the stiffness and soreness is resolved she should refrain from lifting pushing or pulling anyone. We will recheck her in a couple weeks and hopefully allow her to go back to her usual work.    Arrie Senate MD

## 2014-07-15 ENCOUNTER — Encounter: Payer: Self-pay | Admitting: Family Medicine

## 2014-07-15 ENCOUNTER — Ambulatory Visit (INDEPENDENT_AMBULATORY_CARE_PROVIDER_SITE_OTHER): Payer: Medicare Other | Admitting: Family Medicine

## 2014-07-15 VITALS — BP 134/66 | HR 63 | Temp 97.1°F | Ht 62.0 in | Wt 194.0 lb

## 2014-07-15 DIAGNOSIS — M79641 Pain in right hand: Secondary | ICD-10-CM | POA: Diagnosis not present

## 2014-07-15 DIAGNOSIS — M79642 Pain in left hand: Secondary | ICD-10-CM

## 2014-07-15 DIAGNOSIS — M1990 Unspecified osteoarthritis, unspecified site: Secondary | ICD-10-CM | POA: Diagnosis not present

## 2014-07-15 DIAGNOSIS — I480 Paroxysmal atrial fibrillation: Secondary | ICD-10-CM

## 2014-07-15 DIAGNOSIS — S0512XD Contusion of eyeball and orbital tissues, left eye, subsequent encounter: Secondary | ICD-10-CM

## 2014-07-15 LAB — POCT INR: INR: 1.9

## 2014-07-15 NOTE — Progress Notes (Signed)
Subjective:    Patient ID: Rhonda Garcia, female    DOB: 10-Jul-1940, 74 y.o.   MRN: [id number]  HPI Patient here today for 2 week follow up from a fall. She came in for a fall that hurt her hands and wrists bilaterally and she also had a contusion to the left side of her face. She is still having a lot of aches and soreness. She is also having some neck pain and stiffness. Her recent x-rays were reviewed with her today and mostly showing osteoarthritis in her hands and wrists. She is also requesting a repeat pro time as her blood was thick when the last one was checked. She is driving a car some now and is getting some better from the fall.      Patient Active Problem List   Diagnosis Date Noted  . Hiatal hernia   . Dysphagia, pharyngoesophageal phase   . History of colonic polyps   . Diverticulosis of colon without hemorrhage   . Rectal bleeding 05/26/2014  . RUQ pain 05/26/2014  . Paroxysmal atrial fibrillation 11/02/2013  . Long term (current) use of anticoagulants 11/02/2013  . Tinea corporis 10/28/2012  . Depression 10/28/2012  . Diabetes mellitus type 2, uncomplicated 01/75/1025  . Lipoma of back 02/12/2012  . Constipation 02/28/2011  . Esophageal dysphagia 02/28/2011  . Gastroesophageal reflux 02/28/2011  . Early satiety 02/28/2011  . POSITIONAL VERTIGO 04/29/2009  . Osteoarthritis 12/23/2008  . Hyperlipidemia 12/21/2008  . Essential hypertension 12/21/2008  . CAD, NATIVE VESSEL 12/21/2008   Outpatient Encounter Prescriptions as of 07/15/2014  Medication Sig  . amLODipine (NORVASC) 5 MG tablet Take 1 tablet (5 mg total) by mouth daily.  . isosorbide mononitrate (IMDUR) 30 MG 24 hr tablet Take 1 tablet (30 mg total) by mouth daily.  Marland Kitchen ketoconazole (NIZORAL) 2 % cream APPLY ONCE DAILY  . lisinopril-hydrochlorothiazide (PRINZIDE,ZESTORETIC) 20-12.5 MG per tablet Take 2 tablets by mouth daily.  Marland Kitchen LORazepam (ATIVAN) 0.5 MG tablet TAKE ONE TABLET EVERY 12 HOURS  . metFORMIN  (GLUCOPHAGE) 500 MG tablet Take 2 tablets (1,000 mg total) by mouth daily with breakfast.  . metoprolol (LOPRESSOR) 50 MG tablet Take 1 tablet (50 mg total) by mouth 2 (two) times daily.  . mirabegron ER (MYRBETRIQ) 25 MG TB24 tablet Take 1 tablet (25 mg total) by mouth daily.  . mupirocin ointment (BACTROBAN) 2 % APPLY INTO THE NOSE TWICE DAILY  . pantoprazole (PROTONIX) 40 MG tablet Take 1 tablet (40 mg total) by mouth daily.  . polyethylene glycol powder (GLYCOLAX/MIRALAX) powder Take one capful twice daily until soft stool, then once at bedtime as needed.  . polyethylene glycol-electrolytes (NULYTELY/GOLYTELY) 420 G solution Take 4,000 mLs by mouth once.  . pravastatin (PRAVACHOL) 80 MG tablet Take 1 tablet (80 mg total) by mouth daily.  . ranitidine (ZANTAC) 150 MG tablet Take 1 tablet (150 mg total) by mouth 2 (two) times daily.  . sertraline (ZOLOFT) 50 MG tablet Take 1 tablet (50 mg total) by mouth daily.  . Vitamin D, Ergocalciferol, (DRISDOL) 50000 UNITS CAPS capsule Take 1 capsule (50,000 Units total) by mouth every 7 (seven) days.  Marland Kitchen warfarin (COUMADIN) 5 MG tablet Take 1 tablet (5 mg total) by mouth daily.  . nitroGLYCERIN (NITROSTAT) 0.4 MG SL tablet Place 0.4 mg under the tongue every 5 (five) minutes as needed.    . [DISCONTINUED] enoxaparin (LOVENOX) 80 MG/0.8ML injection Inject 0.8 mLs (80 mg total) into the skin every 12 (twelve) hours.  . [DISCONTINUED] Hydrocodone-Acetaminophen  5-300 MG TABS Take 1 tablet by mouth 4 (four) times daily as needed.     . Review of Systems  Constitutional: Negative.   HENT: Negative.   Eyes: Negative.   Respiratory: Negative.   Cardiovascular: Negative.   Gastrointestinal: Negative.   Endocrine: Negative.   Genitourinary: Negative.   Musculoskeletal: Positive for arthralgias (bilateral hand pain , neck soreness and right knee soreness).  Skin: Positive for color change (bruising to left side face).  Allergic/Immunologic: Negative.     Neurological: Positive for numbness (of bilareral hands at times).  Hematological: Negative.   Psychiatric/Behavioral: Negative.        Objective:   Physical Exam  Constitutional: She is oriented to person, place, and time. She appears well-developed and well-nourished. No distress.  Patient is alert and feeling better since the last visit.  HENT:  Head: Normocephalic.  Right Ear: External ear normal.  Left Ear: External ear normal.  Nose: Nose normal.  Mouth/Throat: Oropharynx is clear and moist. No oropharyngeal exudate.  Left orbital contusion is improving  Eyes: Conjunctivae and EOM are normal. Pupils are equal, round, and reactive to light. Right eye exhibits no discharge. Left eye exhibits no discharge. No scleral icterus.  The patient still has some resolving bruising from around the left orbit with a small hematoma in the medial upper left orbit.  Neck: Normal range of motion. Neck supple. No thyromegaly present.  There were bilateral supraclavicular bruits. Good mobility  Cardiovascular: Normal rate, regular rhythm and normal heart sounds.   No murmur heard. The heart had a regular rate and rhythm today at 72/m  Pulmonary/Chest: Effort normal and breath sounds normal. No respiratory distress. She has no wheezes. She has no rales. She exhibits no tenderness.  Musculoskeletal: Normal range of motion. She exhibits tenderness. She exhibits no edema.  There was tenderness in both hands and DIP joint deformities  Lymphadenopathy:    She has no cervical adenopathy.  Neurological: She is alert and oriented to person, place, and time.  Skin: Skin is warm and dry. No rash noted.  Psychiatric: She has a normal mood and affect. Her behavior is normal. Judgment and thought content normal.  Nursing note and vitals reviewed.  BP 134/66 mmHg  Pulse 63  Temp(Src) 97.1 F (36.2 C) (Oral)  Ht 5\' 2"  (1.575 m)  Wt 194 lb (87.998 kg)  BMI 35.47 kg/m2        Assessment & Plan:  1.  Paroxysmal atrial fibrillation -We will follow-up with a ProTime today since the previous one was low.  2. Bilateral hand pain -The hand pain is improved but she has degenerative changes from the previous x-ray and she was reassured that this will take some time to recover from the accident she had recently. She should continue to take extra strength Tylenol if needed for pain and use warm wet compresses when possible  3. Contusion, orbital tissues, left, subsequent encounter -The contusion is resolving and there is still some bruising still apparent. There is a small lump at the medial upper left orbit from the blow to the head.  4. Osteoarthritis, unspecified osteoarthritis type, unspecified site -Continue to take Tylenol and remain as mobile as possible  Patient Instructions  Continue to move slowly and cautiously so there will be no more falling episodes Use warm compresses to the neck into the hands as this will improve the circulation and help the inflammation from the fall to resolve more quickly Continue to take extra strength Tylenol as  needed for aches and pains We will follow-up on the pro time today the clinical pharmacist will let you know if you need to adjust the medication anymore Follow-up with a provider in the next 3 months for routine management of medical problems   Arrie Senate MD

## 2014-07-15 NOTE — Addendum Note (Signed)
Addended by: Earlene Plater on: 07/15/2014 12:35 PM   Modules accepted: Orders

## 2014-07-15 NOTE — Patient Instructions (Addendum)
Continue to move slowly and cautiously so there will be no more falling episodes Use warm compresses to the neck into the hands as this will improve the circulation and help the inflammation from the fall to resolve more quickly Continue to take extra strength Tylenol as needed for aches and pains We will follow-up on the pro time today the clinical pharmacist will let you know if you need to adjust the medication anymore Follow-up with a provider in the next 3 months for routine management of medical problems  Anticoagulation Dose Instructions as of 07/15/2014      Dorene Grebe Tue Wed Thu Fri Sat   New Dose 5 mg 5 mg 2.5 mg 5 mg 5 mg 5 mg 2.5 mg    Description        Take 1 and 1/2 tablet today - Thursday, April 7th Then increase warfarin dose to 5mg  - 1/2 tablet on tuesdays and saturdays.  Take 1 tablet all other days.      INR was 1.9 today

## 2014-07-29 ENCOUNTER — Ambulatory Visit (INDEPENDENT_AMBULATORY_CARE_PROVIDER_SITE_OTHER): Payer: Medicare Other | Admitting: Pharmacist

## 2014-07-29 DIAGNOSIS — I480 Paroxysmal atrial fibrillation: Secondary | ICD-10-CM

## 2014-07-29 LAB — POCT INR: INR: 1.9

## 2014-07-29 NOTE — Patient Instructions (Signed)
Anticoagulation Dose Instructions as of 07/29/2014      Rhonda Garcia Tue Wed Thu Fri Sat   New Dose 5 mg 5 mg 2.5 mg 5 mg 5 mg 5 mg 5 mg    Description        Take 1 and 1/2 tablet today - Thursday, April 21st Then increase warfarin dose to 5mg  - 1/2 tablet on tuesdays.  Take 1 tablet all other days.     INR was 1.9 today

## 2014-08-19 ENCOUNTER — Ambulatory Visit (INDEPENDENT_AMBULATORY_CARE_PROVIDER_SITE_OTHER): Payer: Medicare Other | Admitting: Pharmacist

## 2014-08-19 DIAGNOSIS — I480 Paroxysmal atrial fibrillation: Secondary | ICD-10-CM | POA: Diagnosis not present

## 2014-08-19 LAB — POCT INR: INR: 2.8

## 2014-08-19 NOTE — Patient Instructions (Signed)
Anticoagulation Dose Instructions as of 08/19/2014      Rhonda Garcia Tue Wed Thu Fri Sat   New Dose 5 mg 5 mg 2.5 mg 5 mg 5 mg 5 mg 5 mg    Description        Continue current warfarin dose of 5mg  - 1/2 tablet on tuesdays.  Take 1 tablet all other days.      INR was 2.8 today

## 2014-09-09 ENCOUNTER — Encounter: Payer: Self-pay | Admitting: Family

## 2014-09-09 ENCOUNTER — Ambulatory Visit (INDEPENDENT_AMBULATORY_CARE_PROVIDER_SITE_OTHER): Payer: Medicare Other | Admitting: Family

## 2014-09-09 ENCOUNTER — Ambulatory Visit (INDEPENDENT_AMBULATORY_CARE_PROVIDER_SITE_OTHER): Payer: Medicare Other

## 2014-09-09 VITALS — BP 147/77 | HR 60 | Temp 97.6°F | Ht 62.0 in | Wt 197.0 lb

## 2014-09-09 DIAGNOSIS — I2510 Atherosclerotic heart disease of native coronary artery without angina pectoris: Secondary | ICD-10-CM | POA: Diagnosis not present

## 2014-09-09 DIAGNOSIS — F32A Depression, unspecified: Secondary | ICD-10-CM

## 2014-09-09 DIAGNOSIS — E119 Type 2 diabetes mellitus without complications: Secondary | ICD-10-CM | POA: Diagnosis not present

## 2014-09-09 DIAGNOSIS — K219 Gastro-esophageal reflux disease without esophagitis: Secondary | ICD-10-CM

## 2014-09-09 DIAGNOSIS — F329 Major depressive disorder, single episode, unspecified: Secondary | ICD-10-CM | POA: Diagnosis not present

## 2014-09-09 DIAGNOSIS — I10 Essential (primary) hypertension: Secondary | ICD-10-CM

## 2014-09-09 DIAGNOSIS — I480 Paroxysmal atrial fibrillation: Secondary | ICD-10-CM

## 2014-09-09 DIAGNOSIS — E785 Hyperlipidemia, unspecified: Secondary | ICD-10-CM

## 2014-09-09 DIAGNOSIS — Z780 Asymptomatic menopausal state: Secondary | ICD-10-CM

## 2014-09-09 DIAGNOSIS — F411 Generalized anxiety disorder: Secondary | ICD-10-CM

## 2014-09-09 DIAGNOSIS — E559 Vitamin D deficiency, unspecified: Secondary | ICD-10-CM | POA: Diagnosis not present

## 2014-09-09 DIAGNOSIS — Z7901 Long term (current) use of anticoagulants: Secondary | ICD-10-CM | POA: Diagnosis not present

## 2014-09-09 LAB — POCT UA - MICROALBUMIN: Microalbumin Ur, POC: 20 mg/L

## 2014-09-09 LAB — POCT GLYCOSYLATED HEMOGLOBIN (HGB A1C): Hemoglobin A1C: 6.8

## 2014-09-09 LAB — POCT INR: INR: 1.5

## 2014-09-09 MED ORDER — ALPRAZOLAM 0.5 MG PO TABS: 0.5000 mg | ORAL_TABLET | Freq: Every evening | ORAL | Status: DC | PRN

## 2014-09-09 MED ORDER — SERTRALINE HCL 100 MG PO TABS: 100.0000 mg | ORAL_TABLET | Freq: Every day | ORAL | Status: DC

## 2014-09-09 NOTE — Patient Instructions (Addendum)
Health Maintenance Adopting a healthy lifestyle and getting preventive care can go a long way to promote health and wellness. Talk with your health care provider about what schedule of regular examinations is right for you. This is a good chance for you to check in with your provider about disease prevention and staying healthy. In between checkups, there are plenty of things you can do on your own. Experts have done a lot of research about which lifestyle changes and preventive measures are most likely to keep you healthy. Ask your health care provider for more information. WEIGHT AND DIET  Eat a healthy diet 1. Be sure to include plenty of vegetables, fruits, low-fat dairy products, and lean protein. 2. Do not eat a lot of foods high in solid fats, added sugars, or salt. 3. Get regular exercise. This is one of the most important things you can do for your health. 1. Most adults should exercise for at least 150 minutes each week. The exercise should increase your heart rate and make you sweat (moderate-intensity exercise). 2. Most adults should also do strengthening exercises at least twice a week. This is in addition to the moderate-intensity exercise.  Maintain a healthy weight  Body mass index (BMI) is a measurement that can be used to identify possible weight problems. It estimates body fat based on height and weight. Your health care provider can help determine your BMI and help you achieve or maintain a healthy weight.  For females 68 years of age and older:   A BMI below 18.5 is considered underweight.  A BMI of 18.5 to 24.9 is normal.  A BMI of 25 to 29.9 is considered overweight.  A BMI of 30 and above is considered obese.  Watch levels of cholesterol and blood lipids  You should start having your blood tested for lipids and cholesterol at 73 years of age, then have this test every 5 years.  You may need to have your cholesterol levels checked more often if:  Your lipid or  cholesterol levels are high.  You are older than 73 years of age.  You are at high risk for heart disease.  CANCER SCREENING   Lung Cancer  Lung cancer screening is recommended for adults 58-76 years old who are at high risk for lung cancer because of a history of smoking.  A yearly low-dose CT scan of the lungs is recommended for people who:  Currently smoke.  Have quit within the past 15 years.  Have at least a 30-pack-year history of smoking. A pack year is smoking an average of one pack of cigarettes a day for 1 year.  Yearly screening should continue until it has been 15 years since you quit.  Yearly screening should stop if you develop a health problem that would prevent you from having lung cancer treatment.  Breast Cancer  Practice breast self-awareness. This means understanding how your breasts normally appear and feel.  It also means doing regular breast self-exams. Let your health care provider know about any changes, no matter how small.  If you are in your 20s or 30s, you should have a clinical breast exam (CBE) by a health care provider every 1-3 years as part of a regular health exam.  If you are 52 or older, have a CBE every year. Also consider having a breast X-ray (mammogram) every year.  If you have a family history of breast cancer, talk to your health care provider about genetic screening.  If you are  at high risk for breast cancer, talk to your health care provider about having an MRI and a mammogram every year.  Breast cancer gene (BRCA) assessment is recommended for women who have family members with BRCA-related cancers. BRCA-related cancers include:  Breast.  Ovarian.  Tubal.  Peritoneal cancers.  Results of the assessment will determine the need for genetic counseling and BRCA1 and BRCA2 testing. Cervical Cancer Routine pelvic examinations to screen for cervical cancer are no longer recommended for nonpregnant women who are considered low  risk for cancer of the pelvic organs (ovaries, uterus, and vagina) and who do not have symptoms. A pelvic examination may be necessary if you have symptoms including those associated with pelvic infections. Ask your health care provider if a screening pelvic exam is right for you.   The Pap test is the screening test for cervical cancer for women who are considered at risk.  If you had a hysterectomy for a problem that was not cancer or a condition that could lead to cancer, then you no longer need Pap tests.  If you are older than 65 years, and you have had normal Pap tests for the past 10 years, you no longer need to have Pap tests.  If you have had past treatment for cervical cancer or a condition that could lead to cancer, you need Pap tests and screening for cancer for at least 20 years after your treatment.  If you no longer get a Pap test, assess your risk factors if they change (such as having a new sexual partner). This can affect whether you should start being screened again.  Some women have medical problems that increase their chance of getting cervical cancer. If this is the case for you, your health care provider may recommend more frequent screening and Pap tests.  The human papillomavirus (HPV) test is another test that may be used for cervical cancer screening. The HPV test looks for the virus that can cause cell changes in the cervix. The cells collected during the Pap test can be tested for HPV.  The HPV test can be used to screen women 30 years of age and older. Getting tested for HPV can extend the interval between normal Pap tests from three to five years.  An HPV test also should be used to screen women of any age who have unclear Pap test results.  After 73 years of age, women should have HPV testing as often as Pap tests.  Colorectal Cancer  This type of cancer can be detected and often prevented.  Routine colorectal cancer screening usually begins at 73 years of  age and continues through 73 years of age.  Your health care provider may recommend screening at an earlier age if you have risk factors for colon cancer.  Your health care provider may also recommend using home test kits to check for hidden blood in the stool.  A small camera at the end of a tube can be used to examine your colon directly (sigmoidoscopy or colonoscopy). This is done to check for the earliest forms of colorectal cancer.  Routine screening usually begins at age 50.  Direct examination of the colon should be repeated every 5-10 years through 73 years of age. However, you may need to be screened more often if early forms of precancerous polyps or small growths are found. Skin Cancer  Check your skin from head to toe regularly.  Tell your health care provider about any new moles or changes in   moles, especially if there is a change in a mole's shape or color.  Also tell your health care provider if you have a mole that is larger than the size of a pencil eraser.  Always use sunscreen. Apply sunscreen liberally and repeatedly throughout the day.  Protect yourself by wearing long sleeves, pants, a wide-brimmed hat, and sunglasses whenever you are outside. HEART DISEASE, DIABETES, AND HIGH BLOOD PRESSURE   Have your blood pressure checked at least every 1-2 years. High blood pressure causes heart disease and increases the risk of stroke.  If you are between 75 years and 42 years old, ask your health care provider if you should take aspirin to prevent strokes.  Have regular diabetes screenings. This involves taking a blood sample to check your fasting blood sugar level.  If you are at a normal weight and have a low risk for diabetes, have this test once every three years after 73 years of age.  If you are overweight and have a high risk for diabetes, consider being tested at a younger age or more often. PREVENTING INFECTION  Hepatitis B  If you have a higher risk for  hepatitis B, you should be screened for this virus. You are considered at high risk for hepatitis B if:  You were born in a country where hepatitis B is common. Ask your health care provider which countries are considered high risk.  Your parents were born in a high-risk country, and you have not been immunized against hepatitis B (hepatitis B vaccine).  You have HIV or AIDS.  You use needles to inject street drugs.  You live with someone who has hepatitis B.  You have had sex with someone who has hepatitis B.  You get hemodialysis treatment.  You take certain medicines for conditions, including cancer, organ transplantation, and autoimmune conditions. Hepatitis C  Blood testing is recommended for:  Everyone born from [birth year] through 1964.  Anyone with known risk factors for hepatitis C. Sexually transmitted infections (STIs)  You should be screened for sexually transmitted infections (STIs) including gonorrhea and chlamydia if:  You are sexually active and are younger than 73 years of age.  You are older than 73 years of age and your health care provider tells you that you are at risk for this type of infection.  Your sexual activity has changed since you were last screened and you are at an increased risk for chlamydia or gonorrhea. Ask your health care provider if you are at risk.  If you do not have HIV, but are at risk, it may be recommended that you take a prescription medicine daily to prevent HIV infection. This is called pre-exposure prophylaxis (PrEP). You are considered at risk if:  You are sexually active and do not regularly use condoms or know the HIV status of your partner(s).  You take drugs by injection.  You are sexually active with a partner who has HIV. Talk with your health care provider about whether you are at high risk of being infected with HIV. If you choose to begin PrEP, you should first be tested for HIV. You should then be tested every 3 months for  as long as you are taking PrEP.  PREGNANCY   If you are premenopausal and you may become pregnant, ask your health care provider about preconception counseling.  If you may become pregnant, take 400 to 800 micrograms (mcg) of folic acid every day.  If you want to prevent pregnancy, talk to your  health care provider about birth control (contraception). OSTEOPOROSIS AND MENOPAUSE   Osteoporosis is a disease in which the bones lose minerals and strength with aging. This can result in serious bone fractures. Your risk for osteoporosis can be identified using a bone density scan.  If you are 18 years of age or older, or if you are at risk for osteoporosis and fractures, ask your health care provider if you should be screened.  Ask your health care provider whether you should take a calcium or vitamin D supplement to lower your risk for osteoporosis.  Menopause may have certain physical symptoms and risks.  Hormone replacement therapy may reduce some of these symptoms and risks. Talk to your health care provider about whether hormone replacement therapy is right for you.  HOME CARE INSTRUCTIONS   Schedule regular health, dental, and eye exams.  Stay current with your immunizations.   Do not use any tobacco products including cigarettes, chewing tobacco, or electronic cigarettes.  If you are pregnant, do not drink alcohol.  If you are breastfeeding, limit how much and how often you drink alcohol.  Limit alcohol intake to no more than 1 drink per day for nonpregnant women. One drink equals 12 ounces of beer, 5 ounces of wine, or 1 ounces of hard liquor.  Do not use street drugs.  Do not share needles.  Ask your health care provider for help if you need support or information about quitting drugs.  Tell your health care provider if you often feel depressed.  Tell your health care provider if you have ever been abused or do not feel safe at home. Document Released: 10/09/2010  Document Revised: 08/10/2013 Document Reviewed: 02/25/2013 [facility] Patient Information 2015 [place], [place]. This information is not intended to replace advice given to you by your health care provider. Make sure you discuss any questions you have with your health care provider. Depression Depression refers to feeling sad, low, down in the dumps, blue, gloomy, or empty. In general, there are two kinds of depression: 4. Normal sadness or normal grief. This kind of depression is one that we all feel from time to time after upsetting life experiences, such as the loss of a job or the ending of a relationship. This kind of depression is considered normal, is short lived, and resolves within a few days to 2 weeks. Depression experienced after the loss of a loved one (bereavement) often lasts longer than 2 weeks but normally gets better with time. 5. Clinical depression. This kind of depression lasts longer than normal sadness or normal grief or interferes with your ability to function at home, at work, and in school. It also interferes with your personal relationships. It affects almost every aspect of your life. Clinical depression is an illness. Symptoms of depression can also be caused by conditions other than those mentioned above, such as:  Physical illness. Some physical illnesses, including underactive thyroid gland (hypothyroidism), severe anemia, specific types of cancer, diabetes, uncontrolled seizures, heart and lung problems, strokes, and chronic pain are commonly associated with symptoms of depression.  Side effects of some prescription medicine. In some people, certain types of medicine can cause symptoms of depression.  Substance abuse. Abuse of alcohol and illicit drugs can cause symptoms of depression. SYMPTOMS Symptoms of normal sadness and normal grief include the following:  Feeling sad or crying for short periods of time.  Not caring about anything (apathy).  Difficulty sleeping  or sleeping too much.  No longer able to enjoy  the things you used to enjoy.  Desire to be by oneself all the time (social isolation).  Lack of energy or motivation.  Difficulty concentrating or remembering.  Change in appetite or weight.  Restlessness or agitation. Symptoms of clinical depression include the same symptoms of normal sadness or normal grief and also the following symptoms:  Feeling sad or crying all the time.  Feelings of guilt or worthlessness.  Feelings of hopelessness or helplessness.  Thoughts of suicide or the desire to harm yourself (suicidal ideation).  Loss of touch with reality (psychotic symptoms). Seeing or hearing things that are not real (hallucinations) or having false beliefs about your life or the people around you (delusions and paranoia). DIAGNOSIS  The diagnosis of clinical depression is usually based on how bad the symptoms are and how long they have lasted. Your health care provider will also ask you questions about your medical history and substance use to find out if physical illness, use of prescription medicine, or substance abuse is causing your depression. Your health care provider may also order blood tests. TREATMENT  Often, normal sadness and normal grief do not require treatment. However, sometimes antidepressant medicine is given for bereavement to ease the depressive symptoms until they resolve. The treatment for clinical depression depends on how bad the symptoms are but often includes antidepressant medicine, counseling with a mental health professional, or both. Your health care provider will help to determine what treatment is best for you. Depression caused by physical illness usually goes away with appropriate medical treatment of the illness. If prescription medicine is causing depression, talk with your health care provider about stopping the medicine, decreasing the dose, or changing to another medicine. Depression caused by the  abuse of alcohol or illicit drugs goes away when you stop using these substances. Some adults need professional help in order to stop drinking or using drugs. SEEK IMMEDIATE MEDICAL CARE IF:  You have thoughts about hurting yourself or others.  You lose touch with reality (have psychotic symptoms).  You are taking medicine for depression and have a serious side effect. FOR MORE INFORMATION  National [employer] on Mental Illness: www.nami.[employer]: https://carter.com/ Document Released: 03/23/2000 Document Revised: 08/10/2013 Document Reviewed: 06/25/2011 [facility] Patient Information 2015 [place], [place]. This information is not intended to replace advice given to you by your health care provider. Make sure you discuss any questions you have with your health care provider. Generalized Anxiety Disorder Generalized anxiety disorder (GAD) is a mental disorder. It interferes with life functions, including relationships, work, and school. GAD is different from normal anxiety, which everyone experiences at some point in their lives in response to specific life events and activities. Normal anxiety actually helps [place] prepare for and get through these life events and activities. Normal anxiety goes away after the event or activity is over.  GAD causes anxiety that is not necessarily related to specific events or activities. It also causes excess anxiety in proportion to specific events or activities. The anxiety associated with GAD is also difficult to control. GAD can vary from mild to severe. People with severe GAD can have intense waves of anxiety with physical symptoms (panic attacks).  SYMPTOMS The anxiety and worry associated with GAD are difficult to control. This anxiety and worry are related to many life events and activities and also occur more days than not for 6 months or longer. People with GAD also have three or more of the following symptoms (one or more  in  children): 6. Restlessness.  7. Fatigue. 8. Difficulty concentrating.  9. Irritability. 10. Muscle tension. 11. Difficulty sleeping or unsatisfying sleep. DIAGNOSIS GAD is diagnosed through an assessment by your health care provider. Your health care provider will ask you questions aboutyour mood,physical symptoms, and events in your life. Your health care provider may ask you about your medical history and use of alcohol or drugs, including prescription medicines. Your health care provider may also do a physical exam and blood tests. Certain medical conditions and the use of certain substances can cause symptoms similar to those associated with GAD. Your health care provider may refer you to a mental health specialist for further evaluation. TREATMENT The following therapies are usually used to treat GAD:   Medication. Antidepressant medication usually is prescribed for long-term daily control. Antianxiety medicines may be added in severe cases, especially when panic attacks occur.   Talk therapy (psychotherapy). Certain types of talk therapy can be helpful in treating GAD by providing support, education, and guidance. A form of talk therapy called cognitive behavioral therapy can teach you healthy ways to think about and react to daily life events and activities.  Stress managementtechniques. These include yoga, meditation, and exercise and can be very helpful when they are practiced regularly. A mental health specialist can help determine which treatment is best for you. Some people see improvement with one therapy. However, other people require a combination of therapies. Document Released: 07/21/2012 Document Revised: 08/10/2013 Document Reviewed: 07/21/2012 [facility] Patient Information 2015 [place], [place]. This information is not intended to replace advice given to you by your health care provider. Make sure you discuss any questions you have with your health care  provider.  Anticoagulation Dose Instructions as of 09/09/2014      Dorene Grebe Tue Wed Thu Fri Sat   New Dose 5 mg 5 mg 5 mg 5 mg 5 mg 5 mg 5 mg    Description        Take 1 and 1/2 tablet today - Thursday, June 2nd.  Then increase current warfarin dose of $Remov'5mg'gDIkdG$  - take 1 tablet every day.     INR was 1.5 today

## 2014-09-09 NOTE — Progress Notes (Signed)
Subjective:    Patient ID: Rhonda Garcia, female    DOB: 1940-07-24, 74 y.o.   MRN: [id number]  Diabetes She presents for her follow-up diabetic visit. She has type 2 diabetes mellitus. Her disease course has been fluctuating. Hypoglycemia symptoms include headaches and nervousness/anxiousness. Pertinent negatives for hypoglycemia include no confusion or dizziness. Associated symptoms include visual change. Pertinent negatives for diabetes include no chest pain, no fatigue, no foot paresthesias and no foot ulcerations. There are no hypoglycemic complications. Symptoms are stable. Diabetic complications include heart disease. Pertinent negatives for diabetic complications include no CVA, nephropathy or peripheral neuropathy. Risk factors for coronary artery disease include dyslipidemia, family history, hypertension, post-menopausal, stress and diabetes mellitus. Current diabetic treatment includes oral agent (dual therapy). She is compliant with treatment all of the time. She is following a generally unhealthy diet. She participates in exercise weekly. (Pt states she doesn't check her BS ) An ACE inhibitor/angiotensin II receptor blocker is being taken. Eye exam is current.  Hypertension This is a chronic problem. The current episode started more than 1 year ago. The problem has been waxing and waning since onset. The problem is uncontrolled. Associated symptoms include anxiety and headaches. Pertinent negatives include no chest pain, palpitations, peripheral edema or shortness of breath. Risk factors for coronary artery disease include diabetes mellitus, dyslipidemia, family history, obesity and post-menopausal state. Past treatments include ACE inhibitors, diuretics, calcium channel blockers and beta blockers. The current treatment provides moderate improvement. Hypertensive end-organ damage includes CAD/MI. There is no history of kidney disease, CVA, heart failure or a thyroid problem. Identifiable  causes of hypertension include sleep apnea.  Hyperlipidemia This is a chronic problem. The current episode started more than 1 year ago. The problem is uncontrolled. Recent lipid tests were reviewed and are high. Exacerbating diseases include diabetes. She has no history of hypothyroidism. Pertinent negatives include no chest pain, leg pain, myalgias or shortness of breath. Current antihyperlipidemic treatment includes statins. The current treatment provides moderate improvement of lipids. Risk factors for coronary artery disease include diabetes mellitus, dyslipidemia, family history, hypertension, obesity, post-menopausal and a sedentary lifestyle.  Gastrophageal Reflux She reports no chest pain, no heartburn, no nausea or no sore throat. This is a chronic problem. The current episode started more than 1 year ago. The problem occurs rarely. The problem has been resolved. The symptoms are aggravated by certain foods. Pertinent negatives include no fatigue. She has tried a PPI and a diet change for the symptoms. The treatment provided significant relief.  Anxiety Presents for follow-up visit. Onset was 1 to 6 months ago. The problem has been rapidly worsening. Symptoms include depressed mood, excessive worry, insomnia and nervous/anxious behavior. Patient reports no chest pain, confusion, dizziness, irritability, nausea, palpitations or shortness of breath. Symptoms occur occasionally. The severity of symptoms is mild. The symptoms are aggravated by family issues and work stress.   Her past medical history is significant for anxiety/panic attacks and depression. There is no history of CAD. Past treatments include SSRIs.  Constipation This is a chronic problem. The current episode started more than 1 year ago. The problem is unchanged. Her stool frequency is 4 to 5 times per week. The stool is described as firm. Associated symptoms include bloating. Pertinent negatives include no fever, hematochezia or  nausea. The treatment provided mild relief.      Review of Systems  Constitutional: Negative.  Negative for fever, irritability and fatigue.  HENT: Negative for sore throat.   Eyes: Negative.   Respiratory:  Negative.  Negative for shortness of breath.   Cardiovascular: Negative.  Negative for chest pain and palpitations.  Gastrointestinal: Positive for constipation and bloating. Negative for heartburn, nausea and hematochezia.  Endocrine: Negative.   Genitourinary: Negative.   Musculoskeletal: Negative.  Negative for myalgias.  Neurological: Positive for headaches. Negative for dizziness.  Hematological: Negative.   Psychiatric/Behavioral: Negative for confusion. The patient is nervous/anxious and has insomnia.   All other systems reviewed and are negative.      Objective:   Physical Exam  Constitutional: She is oriented to person, place, and time. She appears well-developed and well-nourished. No distress.  HENT:  Head: Normocephalic and atraumatic.  Right Ear: External ear normal.  Left Ear: External ear normal.  Mouth/Throat: Oropharynx is clear and moist.  Eyes: Pupils are equal, round, and reactive to light.  Neck: Normal range of motion. Neck supple. No thyromegaly present.  Cardiovascular: Normal rate, regular rhythm, normal heart sounds and intact distal pulses.   No murmur heard. Pulmonary/Chest: Effort normal and breath sounds normal. No respiratory distress. She has no wheezes.  Abdominal: Soft. Bowel sounds are normal. She exhibits no distension. There is no tenderness.  Musculoskeletal: Normal range of motion. She exhibits no edema or tenderness.  Neurological: She is alert and oriented to person, place, and time. She has normal reflexes. No cranial nerve deficit.  Skin: Skin is warm and dry.  Psychiatric: She has a normal mood and affect. Her behavior is normal. Judgment and thought content normal.  Vitals reviewed.     BP 147/77 mmHg  Pulse 60  Temp(Src)  97.6 F (36.4 C) (Oral)  Ht _0  (1.575 m)  Wt 197 lb (89.359 kg)  BMI 36.02 kg/m2     Assessment & Plan:  1. Essential hypertension - CMP14+EGFR  2. Atherosclerosis of native coronary artery of native heart without angina pectoris - CMP14+EGFR  3. Gastroesophageal reflux disease without esophagitis - CMP14+EGFR - POCT INR  4. Diabetes mellitus type 2, uncomplicated - PIR51+OACZ - POCT glycosylated hemoglobin (Hb A1C) - POCT UA - Microalbumin  5. Hyperlipidemia - CMP14+EGFR - Lipid panel  6. Depression - CMP14+EGFR - sertraline (ZOLOFT) 100 MG tablet; Take 1 tablet (100 mg total) by mouth daily.  Dispense: 30 tablet; Refill: 3 - ALPRAZolam (XANAX) 0.5 MG tablet; Take 1 tablet (0.5 mg total) by mouth at bedtime as needed for anxiety.  Dispense: 60 tablet; Refill: 3  7. Long term current use of anticoagulant therapy - CMP14+EGFR - POCT INR  8. Vitamin D deficiency - CMP14+EGFR - Vit D  25 hydroxy (rtn osteoporosis monitoring)  9. Post-menopausal - CMP14+EGFR - DG Bone Density; Future  10. GAD (generalized anxiety disorder) - sertraline (ZOLOFT) 100 MG tablet; Take 1 tablet (100 mg total) by mouth daily.  Dispense: 30 tablet; Refill: 3 - ALPRAZolam (XANAX) 0.5 MG tablet; Take 1 tablet (0.5 mg total) by mouth at bedtime as needed for anxiety.  Dispense: 60 tablet; Refill: 3  Stress management discussed- Pt states it has been 16 months since she lost her husband. Pt states she is still learning to deal with it.  Continue all meds Labs pending Health Maintenance reviewed Diet and exercise encouraged RTO 3 months  Evelina Dun, FNP

## 2014-09-10 ENCOUNTER — Other Ambulatory Visit: Payer: Self-pay | Admitting: Family

## 2014-09-10 LAB — LIPID PANEL
Chol/HDL Ratio: 4 ratio units (ref 0.0–4.4)
Cholesterol, Total: 181 mg/dL (ref 100–199)
HDL: 45 mg/dL (ref >39)
LDL Calculated: 118 mg/dL — ABNORMAL HIGH (ref 0–99)
Triglycerides: 92 mg/dL (ref 0–149)
VLDL Cholesterol Cal: 18 mg/dL (ref 5–40)

## 2014-09-10 LAB — CMP14+EGFR
ALT: 14 IU/L (ref 0–32)
AST: 14 IU/L (ref 0–40)
Albumin/Globulin Ratio: 1.7 (ref 1.1–2.5)
Albumin: 4 g/dL (ref 3.5–4.8)
Alkaline Phosphatase: 74 IU/L (ref 39–117)
BUN/Creatinine Ratio: 20 (ref 11–26)
BUN: 16 mg/dL (ref 8–27)
Bilirubin Total: 0.7 mg/dL (ref 0.0–1.2)
CO2: 25 mmol/L (ref 18–29)
Calcium: 9.2 mg/dL (ref 8.7–10.3)
Chloride: 101 mmol/L (ref 97–108)
Creatinine, Ser: 0.79 mg/dL (ref 0.57–1.00)
GFR calc Af Amer: 86 mL/min/{1.73_m2} (ref >59)
GFR calc non Af Amer: 74 mL/min/{1.73_m2} (ref >59)
Globulin, Total: 2.4 g/dL (ref 1.5–4.5)
Glucose: 115 mg/dL — ABNORMAL HIGH (ref 65–99)
Potassium: 4.1 mmol/L (ref 3.5–5.2)
Sodium: 141 mmol/L (ref 134–144)
Total Protein: 6.4 g/dL (ref 6.0–8.5)

## 2014-09-10 LAB — VITAMIN D 25 HYDROXY (VIT D DEFICIENCY, FRACTURES): Vit D, 25-Hydroxy: 30.3 ng/mL (ref 30.0–100.0)

## 2014-09-10 LAB — MICROALBUMIN, URINE: Microalbumin, Urine: 3.6 ug/mL

## 2014-09-22 ENCOUNTER — Ambulatory Visit: Payer: Medicare Other | Admitting: Gastroenterology

## 2014-10-19 ENCOUNTER — Ambulatory Visit: Payer: Medicare Other | Admitting: Gastroenterology

## 2014-10-19 ENCOUNTER — Telehealth: Payer: Self-pay | Admitting: Gastroenterology

## 2014-10-19 NOTE — Telephone Encounter (Signed)
Pt was a no show

## 2014-10-21 ENCOUNTER — Other Ambulatory Visit: Payer: Self-pay | Admitting: Pharmacist

## 2014-10-21 ENCOUNTER — Ambulatory Visit (INDEPENDENT_AMBULATORY_CARE_PROVIDER_SITE_OTHER): Payer: Medicare Other | Admitting: Pharmacist

## 2014-10-21 DIAGNOSIS — Z7901 Long term (current) use of anticoagulants: Secondary | ICD-10-CM | POA: Diagnosis not present

## 2014-10-21 DIAGNOSIS — I480 Paroxysmal atrial fibrillation: Secondary | ICD-10-CM

## 2014-10-21 LAB — POCT INR: INR: 1.8

## 2014-10-21 NOTE — Patient Instructions (Signed)
Anticoagulation Dose Instructions as of 10/21/2014      Dorene Grebe Tue Wed Thu Fri Sat   New Dose 5 mg 5 mg 5 mg 5 mg 5 mg 5 mg 5 mg    Description        Take 1 and 1/2 tablet today - Thursday, July 14th.  The resume current warfarin dose of 5mg  - take 1 tablet every day.     INR was 1.8 today

## 2014-10-22 NOTE — Telephone Encounter (Signed)
Hydrocodone is not on pt medication list?

## 2014-10-28 MED ORDER — HYDROCODONE-ACETAMINOPHEN 5-325 MG PO TABS: 1.0000 | ORAL_TABLET | Freq: Four times a day (QID) | ORAL | Status: DC | PRN

## 2014-10-28 NOTE — Telephone Encounter (Signed)
Patient states that she is requesting it for her arthritis and elbow pain. She said she has been using tylenol and it is not helping. Patient states that you have given it to her in the past also.

## 2014-10-28 NOTE — Telephone Encounter (Signed)
RX ready for pick up 

## 2014-10-28 NOTE — Telephone Encounter (Signed)
Pt aware.

## 2014-11-23 ENCOUNTER — Ambulatory Visit (INDEPENDENT_AMBULATORY_CARE_PROVIDER_SITE_OTHER): Payer: Medicare Other | Admitting: Pharmacist Clinician (PhC)/ Clinical Pharmacy Specialist

## 2014-11-23 DIAGNOSIS — Z7901 Long term (current) use of anticoagulants: Secondary | ICD-10-CM | POA: Diagnosis not present

## 2014-11-23 DIAGNOSIS — I482 Chronic atrial fibrillation, unspecified: Secondary | ICD-10-CM

## 2014-11-23 DIAGNOSIS — I480 Paroxysmal atrial fibrillation: Secondary | ICD-10-CM

## 2014-11-23 LAB — POCT INR: INR: 1.9

## 2014-12-21 ENCOUNTER — Encounter: Payer: Medicare Other | Admitting: Pharmacist Clinician (PhC)/ Clinical Pharmacy Specialist

## 2015-01-11 ENCOUNTER — Telehealth: Payer: Self-pay | Admitting: Family

## 2015-01-11 NOTE — Telephone Encounter (Signed)
Stp and given appt tomorrow with Dr.Vincent at 11:30 for headaches and swollen feet. Pt also given appt with Christy for regular 3 month follow up.

## 2015-01-12 ENCOUNTER — Ambulatory Visit: Payer: Medicare Other | Admitting: Pediatrics

## 2015-01-14 ENCOUNTER — Other Ambulatory Visit: Payer: Self-pay | Admitting: Family

## 2015-01-17 ENCOUNTER — Ambulatory Visit (INDEPENDENT_AMBULATORY_CARE_PROVIDER_SITE_OTHER): Payer: Medicare Other | Admitting: Family

## 2015-01-17 ENCOUNTER — Encounter: Payer: Self-pay | Admitting: Family

## 2015-01-17 VITALS — BP 132/99 | HR 69 | Temp 97.5°F | Ht 62.0 in | Wt 195.8 lb

## 2015-01-17 DIAGNOSIS — E119 Type 2 diabetes mellitus without complications: Secondary | ICD-10-CM

## 2015-01-17 DIAGNOSIS — E785 Hyperlipidemia, unspecified: Secondary | ICD-10-CM | POA: Diagnosis not present

## 2015-01-17 DIAGNOSIS — E039 Hypothyroidism, unspecified: Secondary | ICD-10-CM | POA: Diagnosis not present

## 2015-01-17 DIAGNOSIS — M1990 Unspecified osteoarthritis, unspecified site: Secondary | ICD-10-CM | POA: Diagnosis not present

## 2015-01-17 DIAGNOSIS — K5900 Constipation, unspecified: Secondary | ICD-10-CM | POA: Diagnosis not present

## 2015-01-17 DIAGNOSIS — Z7901 Long term (current) use of anticoagulants: Secondary | ICD-10-CM

## 2015-01-17 DIAGNOSIS — F32A Depression, unspecified: Secondary | ICD-10-CM

## 2015-01-17 DIAGNOSIS — E559 Vitamin D deficiency, unspecified: Secondary | ICD-10-CM

## 2015-01-17 DIAGNOSIS — Z09 Encounter for follow-up examination after completed treatment for conditions other than malignant neoplasm: Secondary | ICD-10-CM

## 2015-01-17 DIAGNOSIS — E041 Nontoxic single thyroid nodule: Secondary | ICD-10-CM

## 2015-01-17 DIAGNOSIS — J189 Pneumonia, unspecified organism: Secondary | ICD-10-CM

## 2015-01-17 DIAGNOSIS — K449 Diaphragmatic hernia without obstruction or gangrene: Secondary | ICD-10-CM

## 2015-01-17 DIAGNOSIS — K219 Gastro-esophageal reflux disease without esophagitis: Secondary | ICD-10-CM | POA: Diagnosis not present

## 2015-01-17 DIAGNOSIS — I10 Essential (primary) hypertension: Secondary | ICD-10-CM

## 2015-01-17 DIAGNOSIS — I480 Paroxysmal atrial fibrillation: Secondary | ICD-10-CM | POA: Diagnosis not present

## 2015-01-17 DIAGNOSIS — F329 Major depressive disorder, single episode, unspecified: Secondary | ICD-10-CM

## 2015-01-17 LAB — POCT GLYCOSYLATED HEMOGLOBIN (HGB A1C): Hemoglobin A1C: 6.6

## 2015-01-17 LAB — POCT INR: INR: 1.8

## 2015-01-17 MED ORDER — LEVOTHYROXINE SODIUM 25 MCG PO TABS: 25.0000 ug | ORAL_TABLET | Freq: Every day | ORAL | Status: DC

## 2015-01-17 NOTE — Progress Notes (Signed)
Subjective:    Patient ID: Rhonda Garcia, female    DOB: 06-23-1940, 74 y.o.   MRN: [id number]  Pt presents to the office today for chronic follow up. Since pt's last follow up she was admitted in the hospital for CAP for 4 days on 10/03-10/07. Pt states she is still coughing, spitting up mucus, and lower back pain. Pt states she was given levofloxacin 750 mg for 7 days. Pt states she has 3 days left. I spoke with the hospitalitis and states  Pt was diagnosed with hypothyroidism and noted to have thyroid nodule and lung nodules.  Hospitalitis would like PCP to follow up with this and recommends ultrasound of thyroid and follow up in 3-6 months on CT chest for lung nodules.   Diabetes She presents for her follow-up diabetic visit. She has type 2 diabetes mellitus. Her disease course has been fluctuating. Hypoglycemia symptoms include headaches and nervousness/anxiousness. Pertinent negatives for hypoglycemia include no confusion or dizziness. Associated symptoms include visual change. Pertinent negatives for diabetes include no chest pain, no fatigue, no foot paresthesias and no foot ulcerations. There are no hypoglycemic complications. Symptoms are stable. Diabetic complications include heart disease and nephropathy. Pertinent negatives for diabetic complications include no CVA or peripheral neuropathy. Risk factors for coronary artery disease include dyslipidemia, family history, hypertension, post-menopausal, stress and diabetes mellitus. Current diabetic treatment includes oral agent (dual therapy). She is compliant with treatment all of the time. She is following a generally unhealthy diet. She participates in exercise weekly. Her breakfast blood glucose range is generally 180-200 mg/dl. ( ) An ACE inhibitor/angiotensin II receptor blocker is being taken. Eye exam is current.  Hypertension This is a chronic problem. The current episode started more than 1 year ago. The problem has been waxing and  waning since onset. The problem is uncontrolled. Associated symptoms include anxiety and headaches. Pertinent negatives include no chest pain, palpitations, peripheral edema or shortness of breath. Risk factors for coronary artery disease include diabetes mellitus, dyslipidemia, family history, obesity and post-menopausal state. Past treatments include ACE inhibitors, diuretics, calcium channel blockers and beta blockers. The current treatment provides moderate improvement. Hypertensive end-organ damage includes CAD/MI. There is no history of kidney disease, CVA, heart failure or a thyroid problem. Identifiable causes of hypertension include sleep apnea.  Hyperlipidemia This is a chronic problem. The current episode started more than 1 year ago. The problem is uncontrolled. Recent lipid tests were reviewed and are high. Exacerbating diseases include diabetes. She has no history of hypothyroidism. Pertinent negatives include no chest pain, leg pain, myalgias or shortness of breath. Current antihyperlipidemic treatment includes statins. The current treatment provides moderate improvement of lipids. Risk factors for coronary artery disease include diabetes mellitus, dyslipidemia, family history, hypertension, obesity, post-menopausal and a sedentary lifestyle.  Gastrophageal Reflux She complains of coughing. She reports no chest pain, no heartburn, no nausea, no sore throat or no wheezing. This is a chronic problem. The current episode started more than 1 year ago. The problem occurs rarely. The problem has been resolved. The symptoms are aggravated by certain foods. Pertinent negatives include no fatigue. She has tried a PPI and a diet change for the symptoms. The treatment provided significant relief.  Anxiety Presents for follow-up visit. Onset was 1 to 6 months ago. The problem has been rapidly worsening. Symptoms include depressed mood, excessive worry, insomnia and nervous/anxious behavior. Patient reports  no chest pain, confusion, dizziness, irritability, nausea, palpitations or shortness of breath. Symptoms occur occasionally. The severity of  symptoms is mild. The symptoms are aggravated by family issues and work stress.   Her past medical history is significant for anxiety/panic attacks and depression. There is no history of CAD. Past treatments include SSRIs.  Constipation This is a chronic problem. The current episode started more than 1 year ago. The problem is unchanged. Her stool frequency is 4 to 5 times per week. The stool is described as firm. Associated symptoms include bloating. Pertinent negatives include no fever, hematochezia or nausea. The treatment provided mild relief.  Pneumonia She complains of cough, difficulty breathing and sputum production. There is no shortness of breath or wheezing. This is a new problem. The current episode started 1 to 4 weeks ago. The problem occurs intermittently. The problem has been gradually improving. The cough is productive. Associated symptoms include headaches. Pertinent negatives include no chest pain, fever, heartburn, myalgias or sore throat. Her symptoms are aggravated by exercise. Relieved by: antbiotic. She reports moderate improvement on treatment.      Review of Systems  Constitutional: Negative.  Negative for fever, irritability and fatigue.  HENT: Negative for sore throat.   Eyes: Negative.   Respiratory: Positive for cough and sputum production. Negative for shortness of breath and wheezing.   Cardiovascular: Negative.  Negative for chest pain and palpitations.  Gastrointestinal: Positive for constipation and bloating. Negative for heartburn, nausea and hematochezia.  Endocrine: Negative.   Genitourinary: Negative.   Musculoskeletal: Negative.  Negative for myalgias.  Neurological: Positive for headaches. Negative for dizziness.  Hematological: Negative.   Psychiatric/Behavioral: Negative for confusion. The patient is  nervous/anxious and has insomnia.   All other systems reviewed and are negative.      Objective:   Physical Exam  Constitutional: She is oriented to person, place, and time. She appears well-developed and well-nourished. No distress.  HENT:  Head: Normocephalic and atraumatic.  Right Ear: External ear normal.  Left Ear: External ear normal.  Nose: Nose normal.  Mouth/Throat: Oropharynx is clear and moist.  Eyes: Pupils are equal, round, and reactive to light.  Neck: Normal range of motion. Neck supple. No thyromegaly present.  Cardiovascular: Normal rate, regular rhythm, normal heart sounds and intact distal pulses.   No murmur heard. Pulmonary/Chest: Effort normal and breath sounds normal. No respiratory distress. She has no wheezes.  Lung sounds diminished and chest tightness, no rhonchi noted   Abdominal: Soft. Bowel sounds are normal. She exhibits no distension. There is no tenderness.  Musculoskeletal: Normal range of motion. She exhibits no edema or tenderness.  Neurological: She is alert and oriented to person, place, and time. She has normal reflexes. No cranial nerve deficit.  Skin: Skin is warm and dry.  Psychiatric: She has a normal mood and affect. Her behavior is normal. Judgment and thought content normal.  Vitals reviewed.   Blood pressure 132/99, pulse 69, temperature 97.5 F (36.4 C), temperature source Oral, height 5' 2"  (1.575 m), weight 195 lb 12.8 oz (88.814 kg).       Assessment & Plan:  1. Essential hypertension - CMP14+EGFR  2. Hiatal hernia - CMP14+EGFR  3. Constipation, unspecified constipation type - CMP14+EGFR  4. Hypothyroidism, unspecified hypothyroidism type -Pt started on levothyroxine 25 mcg today - CMP14+EGFR - Thyroid Panel With TSH - levothyroxine (LEVOTHROID) 25 MCG tablet; Take 1 tablet (25 mcg total) by mouth daily before breakfast.  Dispense: 90 tablet; Refill: 1  5. Vitamin D deficiency - CMP14+EGFR - Vit D  25 hydroxy (rtn  osteoporosis monitoring)  6. Hyperlipidemia -  CMP14+EGFR - Lipid panel  7. Depression - CMP14+EGFR  8. Osteoarthritis, unspecified osteoarthritis type, unspecified site - CMP14+EGFR  9. Type 2 diabetes mellitus without complication, without long-term current use of insulin (HCC) - POCT glycosylated hemoglobin (Hb A1C) - CMP14+EGFR  10. Gastroesophageal reflux disease without esophagitis - CMP14+EGFR  11. Thyroid nodule - CMP14+EGFR - US Soft Tissue Head/Neck; Future  12. Hospital discharge follow-up - CMP14+EGFR - US Soft Tissue Head/Neck; Future  13. Paroxysmal atrial fibrillation (HCC) - POCT INR  14. CAP (community acquired pneumonia) -Continue Levaquin -Start Mucinex  -RTO in 7 days if SOB does not improve   Continue all meds Labs pending Health Maintenance reviewed Diet and exercise encouraged RTO 3 months, Will reorder CT chest at that appointment   Evelina Dun, FNP

## 2015-01-17 NOTE — Patient Instructions (Addendum)
Anticoagulation Dose Instructions as of 01/17/2015      Rhonda Garcia Tue Wed Thu Fri Sat   New Dose 5 mg 5 mg 5 mg 5 mg 5 mg 5 mg 5 mg    Description        Take 1 and 1/2 tablets today - Monday, October 10th.  Then restart regular warfarin dose of 5mg  - take 1 tablet daily.         Hypothyroidism Hypothyroidism is a disorder of the thyroid. The thyroid is a large gland that is located in the lower front of the neck. The thyroid releases hormones that control how the body works. With hypothyroidism, the thyroid does not make enough of these hormones. CAUSES Causes of hypothyroidism may include:  Viral infections.  Pregnancy.  Your own defense system (immune system) attacking your thyroid.  Certain medicines.  Birth defects.  Past radiation treatments to your head or neck.  Past treatment with radioactive iodine.  Past surgical removal of part or all of your thyroid.  Problems with the gland that is located in the center of your brain (pituitary). SIGNS AND SYMPTOMS Signs and symptoms of hypothyroidism may include:  Feeling as though you have no energy (lethargy).  Inability to tolerate cold.  Weight gain that is not explained by a change in diet or exercise habits.  Dry skin.  Coarse hair.  Menstrual irregularity.  Slowing of thought processes.  Constipation.  Sadness or depression. DIAGNOSIS  Your health care provider may diagnose hypothyroidism with blood tests and ultrasound tests. TREATMENT Hypothyroidism is treated with medicine that replaces the hormones that your body does not make. After you begin treatment, it may take several weeks for symptoms to go away. HOME CARE INSTRUCTIONS   Take medicines only as directed by your health care provider.  If you start taking any new medicines, tell your health care provider.  Keep all follow-up visits as directed by your health care provider. This is important. As your condition improves, your dosage needs may  change. You will need to have blood tests regularly so that your health care provider can watch your condition. SEEK MEDICAL CARE IF:  Your symptoms do not get better with treatment.  You are taking thyroid replacement medicine and:  You sweat excessively.  You have tremors.  You feel anxious.  You lose weight rapidly.  You cannot tolerate heat.  You have emotional swings.  You have diarrhea.  You feel weak. SEEK IMMEDIATE MEDICAL CARE IF:   You develop chest pain.  You develop an irregular heartbeat.  You develop a rapid heartbeat.   This information is not intended to replace advice given to you by your health care provider. Make sure you discuss any questions you have with your health care provider.   Document Released: 03/26/2005 Document Revised: 04/16/2014 Document Reviewed: 08/11/2013 Elsevier Interactive Patient Education [employer].

## 2015-01-18 ENCOUNTER — Other Ambulatory Visit: Payer: Self-pay | Admitting: Family

## 2015-01-18 LAB — CMP14+EGFR
ALT: 16 IU/L (ref 0–32)
AST: 10 IU/L (ref 0–40)
Albumin/Globulin Ratio: 1.9 (ref 1.1–2.5)
Albumin: 4.4 g/dL (ref 3.5–4.8)
Alkaline Phosphatase: 88 IU/L (ref 39–117)
BUN/Creatinine Ratio: 19 (ref 11–26)
BUN: 21 mg/dL (ref 8–27)
Bilirubin Total: 0.5 mg/dL (ref 0.0–1.2)
CO2: 27 mmol/L (ref 18–29)
Calcium: 10.2 mg/dL (ref 8.7–10.3)
Chloride: 98 mmol/L (ref 97–108)
Creatinine, Ser: 1.09 mg/dL — ABNORMAL HIGH (ref 0.57–1.00)
GFR calc Af Amer: 58 mL/min/{1.73_m2} — ABNORMAL LOW (ref >59)
GFR calc non Af Amer: 50 mL/min/{1.73_m2} — ABNORMAL LOW (ref >59)
Globulin, Total: 2.3 g/dL (ref 1.5–4.5)
Glucose: 124 mg/dL — ABNORMAL HIGH (ref 65–99)
Potassium: 4.8 mmol/L (ref 3.5–5.2)
Sodium: 141 mmol/L (ref 134–144)
Total Protein: 6.7 g/dL (ref 6.0–8.5)

## 2015-01-18 LAB — LIPID PANEL
Chol/HDL Ratio: 3.4 ratio units (ref 0.0–4.4)
Cholesterol, Total: 151 mg/dL (ref 100–199)
HDL: 45 mg/dL (ref >39)
LDL Calculated: 81 mg/dL (ref 0–99)
Triglycerides: 126 mg/dL (ref 0–149)
VLDL Cholesterol Cal: 25 mg/dL (ref 5–40)

## 2015-01-18 LAB — THYROID PANEL WITH TSH
Free Thyroxine Index: 1.7 (ref 1.2–4.9)
T3 Uptake Ratio: 28 % (ref 24–39)
T4, Total: 6.1 ug/dL (ref 4.5–12.0)
TSH: 3.04 u[IU]/mL (ref 0.450–4.500)

## 2015-01-18 LAB — VITAMIN D 25 HYDROXY (VIT D DEFICIENCY, FRACTURES): Vit D, 25-Hydroxy: 32.7 ng/mL (ref 30.0–100.0)

## 2015-01-21 ENCOUNTER — Ambulatory Visit (INDEPENDENT_AMBULATORY_CARE_PROVIDER_SITE_OTHER): Payer: Medicare Other | Admitting: Pharmacist

## 2015-01-21 ENCOUNTER — Ambulatory Visit: Payer: Medicare Other | Admitting: Family

## 2015-01-21 DIAGNOSIS — Z7901 Long term (current) use of anticoagulants: Secondary | ICD-10-CM

## 2015-01-21 DIAGNOSIS — I482 Chronic atrial fibrillation, unspecified: Secondary | ICD-10-CM

## 2015-01-21 DIAGNOSIS — I480 Paroxysmal atrial fibrillation: Secondary | ICD-10-CM | POA: Diagnosis not present

## 2015-01-21 LAB — POCT INR: INR: 2.7

## 2015-01-21 NOTE — Patient Instructions (Signed)
Anticoagulation Dose Instructions as of 01/21/2015      Rhonda Garcia Tue Wed Thu Fri Sat   New Dose 5 mg 5 mg 5 mg 5 mg 5 mg 5 mg 5 mg    Description        Take 1/2 tablet today - Friday, October 14th. Then continue current warfarin dose of 5mg  - take 1 tablet daily.      INR was 2.7 today

## 2015-01-24 ENCOUNTER — Other Ambulatory Visit (HOSPITAL_COMMUNITY): Payer: Medicare Other

## 2015-01-26 ENCOUNTER — Ambulatory Visit (HOSPITAL_COMMUNITY)
Admission: RE | Admit: 2015-01-26 | Discharge: 2015-01-26 | Disposition: A | Discharge status: Home / Self Care | Payer: Medicare Other | Source: Ambulatory Visit | Attending: Family | Admitting: Family

## 2015-01-26 ENCOUNTER — Ambulatory Visit (HOSPITAL_COMMUNITY): Payer: Medicare Other

## 2015-01-26 DIAGNOSIS — E042 Nontoxic multinodular goiter: Secondary | ICD-10-CM | POA: Diagnosis not present

## 2015-01-26 DIAGNOSIS — E041 Nontoxic single thyroid nodule: Secondary | ICD-10-CM

## 2015-01-26 DIAGNOSIS — Z09 Encounter for follow-up examination after completed treatment for conditions other than malignant neoplasm: Secondary | ICD-10-CM

## 2015-02-07 ENCOUNTER — Encounter: Payer: Self-pay | Admitting: Family

## 2015-02-07 ENCOUNTER — Ambulatory Visit (INDEPENDENT_AMBULATORY_CARE_PROVIDER_SITE_OTHER): Payer: Medicare Other | Admitting: Family

## 2015-02-07 VITALS — BP 157/71 | HR 67 | Temp 97.3°F | Ht 62.0 in | Wt 199.0 lb

## 2015-02-07 DIAGNOSIS — Z23 Encounter for immunization: Secondary | ICD-10-CM

## 2015-02-07 DIAGNOSIS — I10 Essential (primary) hypertension: Secondary | ICD-10-CM | POA: Diagnosis not present

## 2015-02-07 MED ORDER — HYDROCODONE-ACETAMINOPHEN 5-325 MG PO TABS: 1.0000 | ORAL_TABLET | Freq: Four times a day (QID) | ORAL | Status: DC | PRN

## 2015-02-07 MED ORDER — AMLODIPINE BESYLATE 10 MG PO TABS
10.0000 mg | ORAL_TABLET | Freq: Every day | ORAL | Status: DC
Start: 2015-02-07 — End: 2017-01-10

## 2015-02-07 NOTE — Progress Notes (Signed)
   Subjective:    Patient ID: Rhonda Garcia, female    DOB: 05-12-1940, 74 y.o.   MRN: [id number]  Pt presents to the office today to recheck HTN. Pt's BP is not at goal.  Hypertension This is a chronic problem. The current episode started more than 1 year ago. The problem has been waxing and waning since onset. The problem is uncontrolled. Associated symptoms include peripheral edema ("at times"). Pertinent negatives include no headaches, palpitations or shortness of breath. Risk factors for coronary artery disease include dyslipidemia, obesity, post-menopausal state and sedentary lifestyle. Past treatments include calcium channel blockers, beta blockers, ACE inhibitors and diuretics. Hypertensive end-organ damage includes CAD/MI and a thyroid problem. There is no history of kidney disease, CVA or heart failure. Identifiable causes of hypertension include sleep apnea.      Review of Systems  Constitutional: Negative.   HENT: Negative.   Eyes: Negative.   Respiratory: Negative.  Negative for shortness of breath.   Cardiovascular: Negative.  Negative for palpitations.  Gastrointestinal: Negative.   Endocrine: Negative.   Genitourinary: Negative.   Musculoskeletal: Negative.   Neurological: Negative.  Negative for headaches.  Hematological: Negative.   Psychiatric/Behavioral: Negative.   All other systems reviewed and are negative.      Objective:   Physical Exam  Constitutional: She is oriented to person, place, and time. She appears well-developed and well-nourished. No distress.  HENT:  Head: Normocephalic and atraumatic.  Right Ear: External ear normal.  Left Ear: External ear normal.  Nose: Nose normal.  Mouth/Throat: Oropharynx is clear and moist.  Eyes: Pupils are equal, round, and reactive to light.  Neck: Normal range of motion. Neck supple. No thyromegaly present.  Cardiovascular: Normal rate, regular rhythm, normal heart sounds and intact distal pulses.   No murmur  heard. Pulmonary/Chest: Effort normal and breath sounds normal. No respiratory distress. She has no wheezes.  Abdominal: Soft. Bowel sounds are normal. She exhibits no distension. There is no tenderness.  Musculoskeletal: Normal range of motion. She exhibits no edema or tenderness.  Neurological: She is alert and oriented to person, place, and time. She has normal reflexes. No cranial nerve deficit.  Skin: Skin is warm and dry.  Psychiatric: She has a normal mood and affect. Her behavior is normal. Judgment and thought content normal.  Vitals reviewed.    BP 157/71 mmHg  Pulse 67  Temp(Src) 97.3 F (36.3 C) (Oral)  Ht $R'5\' 2"'HJ$  (1.575 m)  Wt 199 lb (90.266 kg)  BMI 36.39 kg/m2      Assessment & Plan:  1. Essential hypertension -Pt's Norvasc increased to 10 mg from 5 mg -Dash diet information given -Exercise encouraged - Stress Management  -Continue current meds -RTO in 2 weeks - amLODipine (NORVASC) 10 MG tablet; Take 1 tablet (10 mg total) by mouth daily.  Dispense: 90 tablet; Refill: 3 - BMP8+EGFR   Continue all meds Labs pending Health Maintenance reviewed- Flu and Pneumonia 23 given today Diet and exercise encouraged RTO 2 weeks   Evelina Dun, FNP

## 2015-02-07 NOTE — Patient Instructions (Signed)
DASH Eating Plan °DASH stands for "Dietary Approaches to Stop Hypertension." The DASH eating plan is a healthy eating plan that has been shown to reduce high blood pressure (hypertension). Additional health benefits may include reducing the risk of type 2 diabetes mellitus, heart disease, and stroke. The DASH eating plan may also help with weight loss. °WHAT DO I NEED TO KNOW ABOUT THE DASH EATING PLAN? °For the DASH eating plan, you will follow these general guidelines: °· Choose foods with a percent daily value for sodium of less than 5% (as listed on the food label). °· Use salt-free seasonings or herbs instead of table salt or sea salt. °· Check with your health care provider or pharmacist before using salt substitutes. °· Eat lower-sodium products, often labeled as "lower sodium" or "no salt added." °· Eat fresh foods. °· Eat more vegetables, fruits, and low-fat dairy products. °· Choose whole grains. Look for the word "whole" as the first word in the ingredient list. °· Choose fish and skinless chicken or turkey more often than red meat. Limit fish, poultry, and meat to 6 oz (170 g) each day. °· Limit sweets, desserts, sugars, and sugary drinks. °· Choose heart-healthy fats. °· Limit cheese to 1 oz (28 g) per day. °· Eat more home-cooked food and less restaurant, buffet, and fast food. °· Limit fried foods. °· Cook foods using methods other than frying. °· Limit canned vegetables. If you do use them, rinse them well to decrease the sodium. °· When eating at a restaurant, ask that your food be prepared with less salt, or no salt if possible. °WHAT FOODS CAN I EAT? °Seek help from a dietitian for individual calorie needs. °Grains °Whole grain or whole wheat bread. Brown rice. Whole grain or whole wheat pasta. Quinoa, bulgur, and whole grain cereals. Low-sodium cereals. Corn or whole wheat flour tortillas. Whole grain cornbread. Whole grain crackers. Low-sodium crackers. °Vegetables °Fresh or frozen vegetables  (raw, steamed, roasted, or grilled). Low-sodium or reduced-sodium tomato and vegetable juices. Low-sodium or reduced-sodium tomato sauce and paste. Low-sodium or reduced-sodium canned vegetables.  °Fruits °All fresh, canned (in natural juice), or frozen fruits. °Meat and Other Protein Products °Ground beef (85% or leaner), grass-fed beef, or beef trimmed of fat. Skinless chicken or turkey. Ground chicken or turkey. Pork trimmed of fat. All fish and seafood. Eggs. Dried beans, peas, or lentils. Unsalted nuts and seeds. Unsalted canned beans. °Dairy °Low-fat dairy products, such as skim or 1% milk, 2% or reduced-fat cheeses, low-fat ricotta or cottage cheese, or plain low-fat yogurt. Low-sodium or reduced-sodium cheeses. °Fats and Oils °Tub margarines without trans fats. Light or reduced-fat mayonnaise and salad dressings (reduced sodium). Avocado. Safflower, olive, or canola oils. Natural peanut or almond butter. °Other °Unsalted popcorn and pretzels. °The items listed above may not be a complete list of recommended foods or beverages. Contact your dietitian for more options. °WHAT FOODS ARE NOT RECOMMENDED? °Grains °White bread. White pasta. White rice. Refined cornbread. Bagels and croissants. Crackers that contain trans fat. °Vegetables °Creamed or fried vegetables. Vegetables in a cheese sauce. Regular canned vegetables. Regular canned tomato sauce and paste. Regular tomato and vegetable juices. °Fruits °Dried fruits. Canned fruit in light or heavy syrup. Fruit juice. °Meat and Other Protein Products °Fatty cuts of meat. Ribs, chicken wings, bacon, sausage, bologna, salami, chitterlings, fatback, hot dogs, bratwurst, and packaged luncheon meats. Salted nuts and seeds. Canned beans with salt. °Dairy °Whole or 2% milk, cream, half-and-half, and cream cheese. Whole-fat or sweetened yogurt. Full-fat   cheeses or blue cheese. Nondairy creamers and whipped toppings. Processed cheese, cheese spreads, or cheese  curds. °Condiments °Onion and garlic salt, seasoned salt, table salt, and sea salt. Canned and packaged gravies. Worcestershire sauce. Tartar sauce. Barbecue sauce. Teriyaki sauce. Soy sauce, including reduced sodium. Steak sauce. Fish sauce. Oyster sauce. Cocktail sauce. Horseradish. Ketchup and mustard. Meat flavorings and tenderizers. Bouillon cubes. Hot sauce. Tabasco sauce. Marinades. Taco seasonings. Relishes. °Fats and Oils °Butter, stick margarine, lard, shortening, ghee, and bacon fat. Coconut, palm kernel, or palm oils. Regular salad dressings. °Other °Pickles and olives. Salted popcorn and pretzels. °The items listed above may not be a complete list of foods and beverages to avoid. Contact your dietitian for more information. °WHERE CAN I FIND MORE INFORMATION? °[employer]: www.nhlbi.nih.gov/health/health-topics/topics/dash/ °  °This information is not intended to replace advice given to you by your health care provider. Make sure you discuss any questions you have with your health care provider. °  °Document Released: 03/15/2011 Document Revised: 04/16/2014 Document Reviewed: 01/28/2013 °[employer] Interactive Patient Education ©2016 [employer] ° °Hypertension °Hypertension, commonly called high blood pressure, is when the force of blood pumping through your arteries is too strong. Your arteries are the blood vessels that carry blood from your heart throughout your body. A blood pressure reading consists of a higher number over a lower number, such as 110/72. The higher number (systolic) is the pressure inside your arteries when your heart pumps. The lower number (diastolic) is the pressure inside your arteries when your heart relaxes. Ideally you want your blood pressure below 120/80. °Hypertension forces your heart to work harder to pump blood. Your arteries may become narrow or stiff. Having untreated or uncontrolled hypertension can cause heart attack, stroke, kidney  disease, and other problems. °RISK FACTORS °Some risk factors for high blood pressure are controllable. Others are not.  °Risk factors you cannot control include:  °· Race. You may be at higher risk if you are African American. °· Age. Risk increases with age. °· Gender. Men are at higher risk than women before age 45 years. After age 65, women are at higher risk than men. °Risk factors you can control include: °· Not getting enough exercise or physical activity. °· Being overweight. °· Getting too much fat, sugar, calories, or salt in your diet. °· Drinking too much alcohol. °SIGNS AND SYMPTOMS °Hypertension does not usually cause signs or symptoms. Extremely high blood pressure (hypertensive crisis) may cause headache, anxiety, shortness of breath, and nosebleed. °DIAGNOSIS °To check if you have hypertension, your health care provider will measure your blood pressure while you are seated, with your arm held at the level of your heart. It should be measured at least twice using the same arm. Certain conditions can cause a difference in blood pressure between your right and left arms. A blood pressure reading that is higher than normal on one occasion does not mean that you need treatment. If it is not clear whether you have high blood pressure, you may be asked to return on a different day to have your blood pressure checked again. Or, you may be asked to monitor your blood pressure at home for 1 or more weeks. °TREATMENT °Treating high blood pressure includes making lifestyle changes and possibly taking medicine. Living a healthy lifestyle can help lower high blood pressure. You may need to change some of your habits. °Lifestyle changes may include: °· Following the DASH diet. This diet is high in fruits, vegetables, and whole   grains. It is low in salt, red meat, and added sugars. °· Keep your sodium intake below 2,300 mg per day. °· Getting at least 30-45 minutes of aerobic exercise at least 4 times per  week. °· Losing weight if necessary. °· Not smoking. °· Limiting alcoholic beverages. °· Learning ways to reduce stress. °Your health care provider may prescribe medicine if lifestyle changes are not enough to get your blood pressure under control, and if one of the following is true: °· You are 18-59 years of age and your systolic blood pressure is above 140. °· You are 60 years of age or older, and your systolic blood pressure is above 150. °· Your diastolic blood pressure is above 90. °· You have diabetes, and your systolic blood pressure is over 140 or your diastolic blood pressure is over 90. °· You have kidney disease and your blood pressure is above 140/90. °· You have heart disease and your blood pressure is above 140/90. °Your personal target blood pressure may vary depending on your medical conditions, your age, and other factors. °HOME CARE INSTRUCTIONS °· Have your blood pressure rechecked as directed by your health care provider.   °· Take medicines only as directed by your health care provider. Follow the directions carefully. Blood pressure medicines must be taken as prescribed. The medicine does not work as well when you skip doses. Skipping doses also puts you at risk for problems. °· Do not smoke.   °· Monitor your blood pressure at home as directed by your health care provider.  °SEEK MEDICAL CARE IF:  °· You think you are having a reaction to medicines taken. °· You have recurrent headaches or feel dizzy. °· You have swelling in your ankles. °· You have trouble with your vision. °SEEK IMMEDIATE MEDICAL CARE IF: °· You develop a severe headache or confusion. °· You have unusual weakness, numbness, or feel faint. °· You have severe chest or abdominal pain. °· You vomit repeatedly. °· You have trouble breathing. °MAKE SURE YOU:  °· Understand these instructions. °· Will watch your condition. °· Will get help right away if you are not doing well or get worse. °  °This information is not intended to  replace advice given to you by your health care provider. Make sure you discuss any questions you have with your health care provider. °  °Document Released: 03/26/2005 Document Revised: 08/10/2014 Document Reviewed: 01/16/2013 °[employer] Interactive Patient Education ©2016 [employer] ° °

## 2015-02-07 NOTE — Addendum Note (Signed)
Addended by: Louann Sjogren A on: 02/07/2015 03:36 PM   Modules accepted: Orders

## 2015-02-08 ENCOUNTER — Other Ambulatory Visit: Payer: Self-pay | Admitting: Family

## 2015-02-08 DIAGNOSIS — R739 Hyperglycemia, unspecified: Secondary | ICD-10-CM

## 2015-02-08 LAB — BMP8+EGFR
BUN/Creatinine Ratio: 17 (ref 11–26)
BUN: 14 mg/dL (ref 8–27)
CO2: 25 mmol/L (ref 18–29)
Calcium: 9.3 mg/dL (ref 8.7–10.3)
Chloride: 103 mmol/L (ref 97–106)
Creatinine, Ser: 0.82 mg/dL (ref 0.57–1.00)
GFR calc Af Amer: 82 mL/min/{1.73_m2} (ref >59)
GFR calc non Af Amer: 71 mL/min/{1.73_m2} (ref >59)
Glucose: 204 mg/dL — ABNORMAL HIGH (ref 65–99)
Potassium: 4.7 mmol/L (ref 3.5–5.2)
Sodium: 140 mmol/L (ref 136–144)

## 2015-02-11 ENCOUNTER — Other Ambulatory Visit: Payer: Self-pay | Admitting: Family Medicine

## 2015-02-11 ENCOUNTER — Other Ambulatory Visit: Payer: Self-pay | Admitting: Family

## 2015-02-18 ENCOUNTER — Ambulatory Visit: Payer: Self-pay | Admitting: Pharmacist

## 2015-03-21 ENCOUNTER — Encounter: Payer: Self-pay | Admitting: Pharmacist

## 2015-03-21 ENCOUNTER — Ambulatory Visit (INDEPENDENT_AMBULATORY_CARE_PROVIDER_SITE_OTHER): Payer: Medicare Other | Admitting: Pharmacist

## 2015-03-21 ENCOUNTER — Other Ambulatory Visit: Payer: Self-pay | Admitting: Family

## 2015-03-21 VITALS — BP 140/78 | HR 72 | Ht 61.5 in | Wt 197.0 lb

## 2015-03-21 DIAGNOSIS — Z7901 Long term (current) use of anticoagulants: Secondary | ICD-10-CM | POA: Diagnosis not present

## 2015-03-21 DIAGNOSIS — Z0000 Encounter for general adult medical examination without abnormal findings: Secondary | ICD-10-CM

## 2015-03-21 DIAGNOSIS — I482 Chronic atrial fibrillation, unspecified: Secondary | ICD-10-CM

## 2015-03-21 DIAGNOSIS — I480 Paroxysmal atrial fibrillation: Secondary | ICD-10-CM | POA: Diagnosis not present

## 2015-03-21 LAB — POCT INR: INR: 2.1

## 2015-03-21 MED ORDER — HYDROCODONE-ACETAMINOPHEN 5-325 MG PO TABS: 1.0000 | ORAL_TABLET | Freq: Four times a day (QID) | ORAL | Status: DC | PRN

## 2015-03-21 MED ORDER — ISOSORBIDE MONONITRATE ER 30 MG PO TB24: 30.0000 mg | ORAL_TABLET | Freq: Every day | ORAL | Status: DC

## 2015-03-21 MED ORDER — SERTRALINE HCL 100 MG PO TABS: 100.0000 mg | ORAL_TABLET | Freq: Every day | ORAL | Status: DC

## 2015-03-21 NOTE — Patient Instructions (Addendum)
Rhonda Garcia , Thank you for taking time to come for your Medicare Wellness Visit. I appreciate your ongoing commitment to your health goals. Please review the following plan we discussed and let me know if I can assist you in the future.   These are the goals we discussed: Start using stationary bike or going to [facility].  Remember to make appointment to have diabetic eye exam Try Kegel exercises to help with bladder strength  This is a list of the screening recommended for you and due dates:  Health Maintenance  Topic Date Due  . Shingles Vaccine  06/25/2001  . Eye exam for diabetics  03/09/2015  . Complete foot exam   05/21/2015  . Hemoglobin A1C  07/18/2015  . Urine Protein Check  09/09/2015  . Flu Shot  11/08/2015  . Mammogram  02/05/2016  . Tetanus Vaccine  12/19/2022  . Colon Cancer Screening  06/16/2024  . DEXA scan (bone density measurement)  Completed  . Pneumonia vaccines  Completed    Anticoagulation Dose Instructions as of 03/21/2015      Dorene Grebe Tue Wed Thu Fri Sat   New Dose 5 mg 5 mg 5 mg 5 mg 5 mg 5 mg 5 mg    Description        Continue current warfarin dose of 15m - take 1 tablet daily.      INR was 2.1 today  Health Maintenance, Female Adopting a healthy lifestyle and getting preventive care can go a long way to promote health and wellness. Talk with your health care provider about what schedule of regular examinations is right for you. This is a good chance for you to check in with your provider about disease prevention and staying healthy. In between checkups, there are plenty of things you can do on your own. Experts have done a lot of research about which lifestyle changes and preventive measures are most likely to keep you healthy. Ask your health care provider for more information. WEIGHT AND DIET  Eat a healthy diet 1. Be sure to include plenty of vegetables, fruits, low-fat dairy products, and lean protein. 2. Do not eat a lot of foods high in solid  fats, added sugars, or salt. 3. Get regular exercise. This is one of the most important things you can do for your health. 1. Most adults should exercise for at least 150 minutes each week. The exercise should increase your heart rate and make you sweat (moderate-intensity exercise). 2. Most adults should also do strengthening exercises at least twice a week. This is in addition to the moderate-intensity exercise.  Maintain a healthy weight  Body mass index (BMI) is a measurement that can be used to identify possible weight problems. It estimates body fat based on height and weight. Your health care provider can help determine your BMI and help you achieve or maintain a healthy weight.  For females 242years of age and older:   A BMI below 18.5 is considered underweight.  A BMI of 18.5 to 24.9 is normal.  A BMI of 25 to 29.9 is considered overweight.  A BMI of 30 and above is considered obese.  Watch levels of cholesterol and blood lipids  You should start having your blood tested for lipids and cholesterol at 73years of age, then have this test every 5 years.  You may need to have your cholesterol levels checked more often if:  Your lipid or cholesterol levels are high.  You are older than  73 years of age.  You are at high risk for heart disease.  CANCER SCREENING   Lung Cancer  Lung cancer screening is recommended for adults 41-60 years old who are at high risk for lung cancer because of a history of smoking.  A yearly low-dose CT scan of the lungs is recommended for people who:  Currently smoke.  Have quit within the past 15 years.  Have at least a 30-pack-year history of smoking. A pack year is smoking an average of one pack of cigarettes a day for 1 year.  Yearly screening should continue until it has been 15 years since you quit.  Yearly screening should stop if you develop a health problem that would prevent you from having lung cancer treatment.  Breast  Cancer  Practice breast self-awareness. This means understanding how your breasts normally appear and feel.  It also means doing regular breast self-exams. Let your health care provider know about any changes, no matter how small.  If you are in your 20s or 30s, you should have a clinical breast exam (CBE) by a health care provider every 1-3 years as part of a regular health exam.  If you are 45 or older, have a CBE every year. Also consider having a breast X-ray (mammogram) every year.  If you have a family history of breast cancer, talk to your health care provider about genetic screening.  If you are at high risk for breast cancer, talk to your health care provider about having an MRI and a mammogram every year.  Breast cancer gene (BRCA) assessment is recommended for women who have family members with BRCA-related cancers. BRCA-related cancers include:  Breast.  Ovarian.  Tubal.  Peritoneal cancers.  Results of the assessment will determine the need for genetic counseling and BRCA1 and BRCA2 testing. Cervical Cancer Your health care provider may recommend that you be screened regularly for cancer of the pelvic organs (ovaries, uterus, and vagina). This screening involves a pelvic examination, including checking for microscopic changes to the surface of your cervix (Pap test). You may be encouraged to have this screening done every 3 years, beginning at age 56.  For women ages 31-65, health care providers may recommend pelvic exams and Pap testing every 3 years, or they may recommend the Pap and pelvic exam, combined with testing for human papilloma virus (HPV), every 5 years. Some types of HPV increase your risk of cervical cancer. Testing for HPV may also be done on women of any age with unclear Pap test results.  Other health care providers may not recommend any screening for nonpregnant women who are considered low risk for pelvic cancer and who do not have symptoms. Ask your  health care provider if a screening pelvic exam is right for you.  If you have had past treatment for cervical cancer or a condition that could lead to cancer, you need Pap tests and screening for cancer for at least 20 years after your treatment. If Pap tests have been discontinued, your risk factors (such as having a new sexual partner) need to be reassessed to determine if screening should resume. Some women have medical problems that increase the chance of getting cervical cancer. In these cases, your health care provider may recommend more frequent screening and Pap tests. Colorectal Cancer  This type of cancer can be detected and often prevented.  Routine colorectal cancer screening usually begins at 73 years of age and continues through 73 years of age.  Your health  care provider may recommend screening at an earlier age if you have risk factors for colon cancer.  Your health care provider may also recommend using home test kits to check for hidden blood in the stool.  A small camera at the end of a tube can be used to examine your colon directly (sigmoidoscopy or colonoscopy). This is done to check for the earliest forms of colorectal cancer.  Routine screening usually begins at age 42.  Direct examination of the colon should be repeated every 5-10 years through 73 years of age. However, you may need to be screened more often if early forms of precancerous polyps or small growths are found. Skin Cancer  Check your skin from head to toe regularly.  Tell your health care provider about any new moles or changes in moles, especially if there is a change in a mole's shape or color.  Also tell your health care provider if you have a mole that is larger than the size of a pencil eraser.  Always use sunscreen. Apply sunscreen liberally and repeatedly throughout the day.  Protect yourself by wearing long sleeves, pants, a wide-brimmed hat, and sunglasses whenever you are outside. HEART  DISEASE, DIABETES, AND HIGH BLOOD PRESSURE   High blood pressure causes heart disease and increases the risk of stroke. High blood pressure is more likely to develop in:  People who have blood pressure in the high end of the normal range (130-139/85-89 mm Hg).  People who are overweight or obese.  People who are African American.  If you are 15-29 years of age, have your blood pressure checked every 3-5 years. If you are 13 years of age or older, have your blood pressure checked every year. You should have your blood pressure measured twice--once when you are at a hospital or clinic, and once when you are not at a hospital or clinic. Record the average of the two measurements. To check your blood pressure when you are not at a hospital or clinic, you can use:  An automated blood pressure machine at a pharmacy.  A home blood pressure monitor.  If you are between 75 years and 59 years old, ask your health care provider if you should take aspirin to prevent strokes.  Have regular diabetes screenings. This involves taking a blood sample to check your fasting blood sugar level.  If you are at a normal weight and have a low risk for diabetes, have this test once every three years after 73 years of age.  If you are overweight and have a high risk for diabetes, consider being tested at a younger age or more often. PREVENTING INFECTION  Hepatitis B  If you have a higher risk for hepatitis B, you should be screened for this virus. You are considered at high risk for hepatitis B if:  You were born in a country where hepatitis B is common. Ask your health care provider which countries are considered high risk.  Your parents were born in a high-risk country, and you have not been immunized against hepatitis B (hepatitis B vaccine).  You have HIV or AIDS.  You use needles to inject street drugs.  You live with someone who has hepatitis B.  You have had sex with someone who has hepatitis  B.  You get hemodialysis treatment.  You take certain medicines for conditions, including cancer, organ transplantation, and autoimmune conditions. Hepatitis C  Blood testing is recommended for:  Everyone born from [birth year] through 1964.  Anyone  with known risk factors for hepatitis C. Sexually transmitted infections (STIs)  You should be screened for sexually transmitted infections (STIs) including gonorrhea and chlamydia if:  You are sexually active and are younger than 73 years of age.  You are older than 73 years of age and your health care provider tells you that you are at risk for this type of infection.  Your sexual activity has changed since you were last screened and you are at an increased risk for chlamydia or gonorrhea. Ask your health care provider if you are at risk.  If you do not have HIV, but are at risk, it may be recommended that you take a prescription medicine daily to prevent HIV infection. This is called pre-exposure prophylaxis (PrEP). You are considered at risk if:  You are sexually active and do not regularly use condoms or know the HIV status of your partner(s).  You take drugs by injection.  You are sexually active with a partner who has HIV. Talk with your health care provider about whether you are at high risk of being infected with HIV. If you choose to begin PrEP, you should first be tested for HIV. You should then be tested every 3 months for as long as you are taking PrEP.  PREGNANCY   If you are premenopausal and you may become pregnant, ask your health care provider about preconception counseling.  If you may become pregnant, take 400 to 800 micrograms (mcg) of folic acid every day.  If you want to prevent pregnancy, talk to your health care provider about birth control (contraception). OSTEOPOROSIS AND MENOPAUSE   Osteoporosis is a disease in which the bones lose minerals and strength with aging. This can result in serious bone fractures. Your  risk for osteoporosis can be identified using a bone density scan.  If you are 69 years of age or older, or if you are at risk for osteoporosis and fractures, ask your health care provider if you should be screened.  Ask your health care provider whether you should take a calcium or vitamin D supplement to lower your risk for osteoporosis.  Menopause may have certain physical symptoms and risks.  Hormone replacement therapy may reduce some of these symptoms and risks. Talk to your health care provider about whether hormone replacement therapy is right for you.  HOME CARE INSTRUCTIONS   Schedule regular health, dental, and eye exams.  Stay current with your immunizations.   Do not use any tobacco products including cigarettes, chewing tobacco, or electronic cigarettes.  If you are pregnant, do not drink alcohol.  If you are breastfeeding, limit how much and how often you drink alcohol.  Limit alcohol intake to no more than 1 drink per day for nonpregnant women. One drink equals 12 ounces of beer, 5 ounces of wine, or 1 ounces of hard liquor.  Do not use street drugs.  Do not share needles.  Ask your health care provider for help if you need support or information about quitting drugs.  Tell your health care provider if you often feel depressed.  Tell your health care provider if you have ever been abused or do not feel safe at home.   This information is not intended to replace advice given to you by your health care provider. Make sure you discuss any questions you have with your health care provider.   Document Released: 10/09/2010 Document Revised: 04/16/2014 Document Reviewed: 02/25/2013 Elsevier Interactive Patient Education 2016 [employer].  Kegel Exercises The  goal of Kegel exercises is to isolate and exercise your pelvic floor muscles. These muscles act as a hammock that supports the rectum, vagina, small intestine, and uterus. As the muscles weaken, the hammock  sags and these organs are displaced from their normal positions. Kegel exercises can strengthen your pelvic floor muscles and help you to improve bladder and bowel control, improve sexual response, and help reduce many problems and some discomfort during pregnancy. Kegel exercises can be done anywhere and at any time. HOW TO PERFORM KEGEL EXERCISES 4. Locate your pelvic floor muscles. To do this, squeeze (contract) the muscles that you use when you try to stop the flow of urine. You will feel a tightness in the vaginal area (women) and a tight lift in the rectal area (men and women). 5. When you begin, contract your pelvic muscles tight for 2-5 seconds, then relax them for 2-5 seconds. This is one set. Do 4-5 sets with a short pause in between. 6. Contract your pelvic muscles for 8-10 seconds, then relax them for 8-10 seconds. Do 4-5 sets. If you cannot contract your pelvic muscles for 8-10 seconds, try 5-7 seconds and work your way up to 8-10 seconds. Your goal is 4-5 sets of 10 contractions each day. Keep your stomach, buttocks, and legs relaxed during the exercises. Perform sets of both short and long contractions. Vary your positions. Perform these contractions 3-4 times per day. Perform sets while you are:   Lying in bed in the morning.  Standing at lunch.  Sitting in the late afternoon.  Lying in bed at night. You should do 40-50 contractions per day. Do not perform more Kegel exercises per day than recommended. Overexercising can cause muscle fatigue. Continue these exercises for for at least 15-20 weeks or as directed by your caregiver.   This information is not intended to replace advice given to you by your health care provider. Make sure you discuss any questions you have with your health care provider.   Document Released: 03/12/2012 Document Revised: 04/16/2014 Document Reviewed: 03/12/2012 Elsevier Interactive Patient Education [employer].

## 2015-03-21 NOTE — Progress Notes (Signed)
Patient ID: Rhonda Garcia, female   DOB: 11-22-1940, 74 y.o.   MRN: [id number]    Subjective:   Rhonda Garcia is a 74 y.o. female who presents for a Subsequent Medicare Annual Wellness Visit.  Patient is a white female who is widowed. She is very happy to share today that she is seeing someone now and that this has greatly lifted her spirits.  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  She lives alone but her 2 sons live close by.  Her daughter lives on the [place].  Review of Systems  Review of Systems  Constitutional: Negative.   HENT: Positive for hearing loss. Negative for congestion, ear discharge, ear pain, nosebleeds, sore throat and tinnitus.   Eyes: Negative.   Respiratory: Negative.  Negative for stridor.   Cardiovascular: Negative.   Gastrointestinal: Positive for heartburn. Negative for nausea, vomiting, abdominal pain, diarrhea, constipation, blood in stool and melena.  Genitourinary: Negative.        She reporst that she wears a pad at night due to occasional urine leakage.   Musculoskeletal: Positive for back pain and falls.  Skin: Negative.   Neurological: Negative.  Negative for headaches.  Endo/Heme/Allergies: Negative.   Psychiatric/Behavioral: Negative.      Current Medications (verified) Outpatient Encounter Prescriptions as of 03/21/2015  Medication Sig  . ALPRAZolam (XANAX) 0.5 MG tablet Take 1 tablet (0.5 mg total) by mouth at bedtime as needed for anxiety.  Marland Kitchen amLODipine (NORVASC) 10 MG tablet Take 1 tablet (10 mg total) by mouth daily.  . isosorbide mononitrate (IMDUR) 30 MG 24 hr tablet Take 1 tablet (30 mg total) by mouth daily.  Marland Kitchen levothyroxine (LEVOTHROID) 25 MCG tablet Take 1 tablet (25 mcg total) by mouth daily before breakfast.  . lisinopril-hydrochlorothiazide (PRINZIDE,ZESTORETIC) 20-12.5 MG per tablet Take 2 tablets by mouth daily.  . metFORMIN (GLUCOPHAGE) 500 MG tablet Take 2 tablets (1,000 mg total) by mouth daily  with breakfast.  . metoprolol (LOPRESSOR) 50 MG tablet TAKE ONE TABLET BY MOUTH TWICE DAILY  . nitroGLYCERIN (NITROSTAT) 0.4 MG SL tablet Place 0.4 mg under the tongue every 5 (five) minutes as needed.    . pravastatin (PRAVACHOL) 80 MG tablet Take 1 tablet (80 mg total) by mouth daily.  . ranitidine (ZANTAC) 150 MG tablet TAKE ONE TABLET BY MOUTH TWICE DAILY  . sertraline (ZOLOFT) 100 MG tablet TAKE ONE (1) TABLET EACH DAY  . Vitamin D, Ergocalciferol, (DRISDOL) 50000 UNITS CAPS capsule Take 1 capsule (50,000 Units total) by mouth every 7 (seven) days.  Marland Kitchen warfarin (COUMADIN) 5 MG tablet TAKE ONE (1) TABLET EACH DAY  . [DISCONTINUED] HYDROcodone-acetaminophen (NORCO/VICODIN) 5-325 MG tablet Take 1 tablet by mouth every 6 (six) hours as needed for moderate pain.  . pantoprazole (PROTONIX) 40 MG tablet Take 1 tablet (40 mg total) by mouth daily.  . [DISCONTINUED] mupirocin ointment (BACTROBAN) 2 % APPLY INTO THE NOSE TWICE DAILY (Patient not taking: Reported on 03/21/2015)  . [DISCONTINUED] polyethylene glycol powder (GLYCOLAX/MIRALAX) powder Take one capful twice daily until soft stool, then once at bedtime as needed. (Patient not taking: Reported on 03/21/2015)   No facility-administered encounter medications on file as of 03/21/2015.    Allergies (verified) Promethazine hcl; Penicillins; and Promethazine hcl   History: Past Medical History  Diagnosis Date  . Diabetes mellitus   . CAD (coronary artery disease)   . GERD (gastroesophageal reflux disease)   . Osteopenia   . Hypertension   .  Hyperlipidemia   . Anal fissure     resolved   . Chronic pain   . Osteoarthritis   . DDD (degenerative disc disease)   . Anemia   . Airway compromise     Inability to tolerate continuous positive airway pressure  . Hiatal hernia   . Dyslipidemia   . Thyroid disease   . Hearing loss   . Generalized headaches   . Sebaceous cyst     on back  . Back pain     with left radiculopathy  . Hx of  myocardial infarction   . Depression   . Hx of peptic ulcer   . A-fib ([place])   . MI (myocardial infarction) ([place]) 2000  . Sleep apnea     not using CPAP now, could not tolerate and PCP aware.   Past Surgical History  Procedure Laterality Date  . Knee surgery Left     arthroscopy  . Appendectomy    . Breast reduction surgery    . Esophagogastroduodenoscopy  06/2007    Small hiatal hernia  . Colonoscopy  06/2007    Friable anal canal and anal papillae. Pancolonic diverticula. Normal terminal ileum. Status post segmental biopsy, descending colon biopsy showed minimal cryptitis. Biopsies felt to be  nonspecific but could be seen with NSAIDs. No features of inflammatory bowel disease. Stool studies were negative.  . Colonoscopy  03/21/2011    RMR: colonic polyps treated as described above, colonic diverticulosis (Tubular adenomas)  . Cholecystectomy  2008 - approximate  . Abdominal hysterectomy  2001 - approximate  . Lipoma excision  03/17/2012    Procedure: EXCISION LIPOMA;  Surgeon: Gwenyth Ober, MD;  Location: [facility];  Service: General;  Laterality: Right;  excision of right lower back lipoma  . Esophagogastroduodenoscopy  03/21/2011    RMR: normal esophagus- status post passage of Maloney dilator. Small hiatal hernia. Trivial antral and bulbar erosions  . Coronary angioplasty with stent placement      4 stents  . Esophagogastroduodenoscopy (egd) with propofol N/A 06/17/2014    RMR: Small hiatal hernia; otherwise normal EGD. Status post passage of a Maloney dilator. No explanation for patients right upper quadrant abdominal pain.   . Esophageal dilation N/A 06/17/2014    Procedure: ESOPHAGEAL DILATION;  Surgeon: Daneil Dolin, MD;  Location: [facility];  Service: Endoscopy;  Laterality: N/A;  Maloney 71 Fr  . Colonoscopy with propofol N/A 06/17/2014    RMR: Colonic diverticulosis. Multiple colonic polyps removed as described above.   . Polypectomy  06/17/2014    Procedure:  POLYPECTOMY;  Surgeon: Daneil Dolin, MD;  Location: [facility];  Service: Endoscopy;;   Family History  Problem Relation Age of Onset  . Heart attack Mother   . Hypertension Mother   . Stroke Mother     Deceased, age 56  . Diabetes Sister   . Diabetes Sister   . Cancer Sister     ovarian  . Stroke Sister   . Diabetes Brother     also has a blood disorder   . Clotting disorder Brother   . Melanoma Brother     deceased  . Coronary artery disease Other   . Kidney disease Other   . Colon cancer Neg Hx    Social History   Occupational History  . Retired    Social History Main Topics  . Smoking status: Former Smoker -- 0.50 packs/day for 5 years    Types: Cigarettes    Quit  date: 03/13/1981  . Smokeless tobacco: Never Used     Comment: quit 30 +years ago  . Alcohol Use: No  . Drug Use: No  . Sexual Activity: Yes    Do you feel safe at home?  Yes  Dietary issues and exercise activities: Current Exercise Habits:: Home exercise routine, Type of exercise: walking, Time (Minutes): 10, Frequency (Times/Week): 1, Weekly Exercise (Minutes/Week): 10, Intensity: Mild  Current Dietary habits:  Patient is limiting high vitamin K foods due to warfarin.  Limits fried foods.   Objective:    Today's Vitals   03/21/15 1414  BP: 140/78  Pulse: 72  Height: 5' 1.5" (1.562 m)  Weight: 197 lb (89.359 kg)  PainSc: 2   PainLoc: Back   Body mass index is 36.62 kg/(m^2).   INR was 2.1 today  Activities of Daily Living In your present state of health, do you have any difficulty performing the following activities: 03/21/2015 06/14/2014  Hearing? Tempie Donning  Vision? N N  Difficulty concentrating or making decisions? N Y  Walking or climbing stairs? N N  Dressing or bathing? N Y  Doing errands, shopping? N N  Preparing Food and eating ? N -  Using the Toilet? N -  In the past six months, have you accidently leaked urine? Y -  Do you have problems with loss of bowel control? N -  Managing  your Medications? N -  Managing your Finances? N -  Housekeeping or managing your Housekeeping? N -    Are there smokers in your home (other than you)? No   Cardiac Risk Factors include: advanced age (>17men, >80 women);diabetes mellitus;dyslipidemia;family history of premature cardiovascular disease;hypertension;obesity (BMI >30kg/m2);sedentary lifestyle  Depression Screen PHQ 2/9 Scores 03/21/2015 01/17/2015 07/15/2014 04/28/2014  PHQ - 2 Score 0 0 0 0    Fall Risk Fall Risk  03/21/2015 01/17/2015 07/15/2014 04/28/2014 02/11/2014  Falls in the past year? Yes Yes Yes No Yes  Number falls in past yr: 1 1 1  - 1  Injury with Fall? Yes Yes Yes - No  Risk Factor Category  High Fall Risk - - - -  Follow up Falls prevention discussed - - - -    Cognitive Function: MMSE - Mini Mental State Exam 03/21/2015  Orientation to time 3  Orientation to Place 5  Registration 3  Attention/ Calculation 2  Recall 3  Language- name 2 objects 2  Language- repeat 1  Language- follow 3 step command 3  Language- read & follow direction 1  Write a sentence 1  Copy design 0  Total score 24    Immunizations and Health Maintenance Immunization History  Administered Date(s) Administered  . Influenza, High Dose Seasonal PF 02/07/2015  . Influenza,inj,Quad PF,36+ Mos 01/19/2013, 05/20/2014  . Pneumococcal Conjugate-13 08/13/2013  . Pneumococcal Polysaccharide-23 02/07/2015  . Tdap 12/18/2012   Health Maintenance Due  Topic Date Due  . ZOSTAVAX  06/25/2001  . OPHTHALMOLOGY EXAM  03/09/2015    Patient Care Team: Sharion Balloon, FNP as PCP - General (Nurse Practitioner) Daneil Dolin, MD (Gastroenterology) Minus Breeding, MD as Consulting Physician (Cardiology) Raeanne Gathers, AUD as Audiologist (Audiology)  Indicate any recent Medical Services you may have received from other than [facility] providers in the past year (date may be approximate).    Assessment:    Annual Wellness Visit  Night time  urinary incontinence / leaking Therapeutic anitcoagulation   Screening Tests Health Maintenance  Topic Date Due  . ZOSTAVAX  06/25/2001  .  OPHTHALMOLOGY EXAM  03/09/2015  . FOOT EXAM  05/21/2015  . HEMOGLOBIN A1C  07/18/2015  . DEXA SCAN  08/07/2015  . URINE MICROALBUMIN  09/09/2015  . INFLUENZA VACCINE  11/08/2015  . MAMMOGRAM  02/05/2016  . TETANUS/TDAP  12/19/2022  . COLONOSCOPY  06/16/2024  . PNA vac Low Risk Adult  Completed        Plan:   During the course of the visit Rhonda Garcia was educated and counseled about the following appropriate screening and preventive services:   Vaccines to include Pneumoccal, Influenza, Hepatitis B, Td, Zostavax - checked cost of Zostavax for patient but she declined because cost was $95  Colorectal cancer screening - UTD  Cardiovascular disease screening - SBP was 140 today.  No change in BP medications at this time.  Lipids at goal.  Diabetes - A1c at goal.  Patient in need of eye exam.  Foot exam UTD  Bone Denisty / Osteoporosis Screening - UTD  Mammogram - UTD  Glaucoma screening / Diabetic Eye Exam -reminded patient that last exam was 02/2014 and she is in need of yearly diabetic eye exam  Nutrition counseling - reviewed limiting serving sizes and CHO intake.    Goal is to decrease weight about 10% over next 12 months.   Advanced Directives - information given and discussed.  Recommended kegel exercises daily.  If she continues to have nocturnal urinary issues consider premarin cream or Myrbetriq.   Exercise - increase to 4 or more times per week for 20 to 30 minutes.  Patient will either use stationary bike or go to [facility]  Anticoagulation Dose Instructions as of 03/21/2015      Dorene Grebe Tue Wed Thu Fri Sat   New Dose 5 mg 5 mg 5 mg 5 mg 5 mg 5 mg 5 mg    Description        Continue current warfarin dose of 5mg  - take 1 tablet daily.       Patient Instructions (the written plan) were given to the patient.   Cherre Robins, [facility]   03/21/2015

## 2015-03-24 ENCOUNTER — Ambulatory Visit: Payer: Self-pay | Admitting: Pharmacist

## 2015-04-10 DIAGNOSIS — K648 Other hemorrhoids: Secondary | ICD-10-CM

## 2015-04-10 HISTORY — DX: Other hemorrhoids: K64.8

## 2015-04-21 ENCOUNTER — Ambulatory Visit (INDEPENDENT_AMBULATORY_CARE_PROVIDER_SITE_OTHER): Payer: Medicare Other | Admitting: Family

## 2015-04-21 ENCOUNTER — Encounter: Payer: Self-pay | Admitting: Family

## 2015-04-21 VITALS — BP 132/65 | HR 57 | Temp 97.1°F | Ht 61.5 in | Wt 200.0 lb

## 2015-04-21 DIAGNOSIS — E785 Hyperlipidemia, unspecified: Secondary | ICD-10-CM

## 2015-04-21 DIAGNOSIS — Z7901 Long term (current) use of anticoagulants: Secondary | ICD-10-CM | POA: Diagnosis not present

## 2015-04-21 DIAGNOSIS — E039 Hypothyroidism, unspecified: Secondary | ICD-10-CM

## 2015-04-21 DIAGNOSIS — F32A Depression, unspecified: Secondary | ICD-10-CM

## 2015-04-21 DIAGNOSIS — E119 Type 2 diabetes mellitus without complications: Secondary | ICD-10-CM | POA: Diagnosis not present

## 2015-04-21 DIAGNOSIS — K5900 Constipation, unspecified: Secondary | ICD-10-CM

## 2015-04-21 DIAGNOSIS — F329 Major depressive disorder, single episode, unspecified: Secondary | ICD-10-CM

## 2015-04-21 DIAGNOSIS — I480 Paroxysmal atrial fibrillation: Secondary | ICD-10-CM

## 2015-04-21 DIAGNOSIS — R252 Cramp and spasm: Secondary | ICD-10-CM

## 2015-04-21 DIAGNOSIS — M1990 Unspecified osteoarthritis, unspecified site: Secondary | ICD-10-CM

## 2015-04-21 DIAGNOSIS — I10 Essential (primary) hypertension: Secondary | ICD-10-CM | POA: Diagnosis not present

## 2015-04-21 DIAGNOSIS — K219 Gastro-esophageal reflux disease without esophagitis: Secondary | ICD-10-CM

## 2015-04-21 DIAGNOSIS — I2510 Atherosclerotic heart disease of native coronary artery without angina pectoris: Secondary | ICD-10-CM

## 2015-04-21 DIAGNOSIS — E559 Vitamin D deficiency, unspecified: Secondary | ICD-10-CM

## 2015-04-21 LAB — POCT GLYCOSYLATED HEMOGLOBIN (HGB A1C): Hemoglobin A1C: 7

## 2015-04-21 LAB — POCT INR: INR: 1.4

## 2015-04-21 MED ORDER — HYDROCODONE-ACETAMINOPHEN 5-325 MG PO TABS: 1.0000 | ORAL_TABLET | Freq: Four times a day (QID) | ORAL | Status: DC | PRN

## 2015-04-21 NOTE — Patient Instructions (Addendum)
Anticoagulation Dose Instructions as of 04/21/2015      Rhonda Garcia Tue Wed Thu Fri Sat   New Dose 5 mg 7.5 mg 5 mg 5 mg 5 mg 5 mg 5 mg    Description        Take 1 and 1/2 tablets today- January 12th.  Then increase warfarin dose to 65m - take 1 tablet daily except take 1 and 1/2 tablets on mondays.         Health Maintenance, Female Adopting a healthy lifestyle and getting preventive care can go a long way to promote health and wellness. Talk with your health care provider about what schedule of regular examinations is right for you. This is a good chance for you to check in with your provider about disease prevention and staying healthy. In between checkups, there are plenty of things you can do on your own. Experts have done a lot of research about which lifestyle changes and preventive measures are most likely to keep you healthy. Ask your health care provider for more information. WEIGHT AND DIET  Eat a healthy diet  Be sure to include plenty of vegetables, fruits, low-fat dairy products, and lean protein.  Do not eat a lot of foods high in solid fats, added sugars, or salt.  Get regular exercise. This is one of the most important things you can do for your health.  Most adults should exercise for at least 150 minutes each week. The exercise should increase your heart rate and make you sweat (moderate-intensity exercise).  Most adults should also do strengthening exercises at least twice a week. This is in addition to the moderate-intensity exercise.  Maintain a healthy weight  Body mass index (BMI) is a measurement that can be used to identify possible weight problems. It estimates body fat based on height and weight. Your health care provider can help determine your BMI and help you achieve or maintain a healthy weight.  For females 254years of age and older:   A BMI below 18.5 is considered underweight.  A BMI of 18.5 to 24.9 is normal.  A BMI of 25 to 29.9 is considered  overweight.  A BMI of 30 and above is considered obese.  Watch levels of cholesterol and blood lipids  You should start having your blood tested for lipids and cholesterol at 74years of age, then have this test every 5 years.  You may need to have your cholesterol levels checked more often if:  Your lipid or cholesterol levels are high.  You are older than 74years of age.  You are at high risk for heart disease.  CANCER SCREENING   Lung Cancer  Lung cancer screening is recommended for adults 59686years old who are at high risk for lung cancer because of a history of smoking.  A yearly low-dose CT scan of the lungs is recommended for people who:  Currently smoke.  Have quit within the past 15 years.  Have at least a 30-pack-year history of smoking. A pack year is smoking an average of one pack of cigarettes a day for 1 year.  Yearly screening should continue until it has been 15 years since you quit.  Yearly screening should stop if you develop a health problem that would prevent you from having lung cancer treatment.  Breast Cancer  Practice breast self-awareness. This means understanding how your breasts normally appear and feel.  It also means doing regular breast self-exams. Let your health care provider  know about any changes, no matter how small.  If you are in your 20s or 30s, you should have a clinical breast exam (CBE) by a health care provider every 1-3 years as part of a regular health exam.  If you are 72 or older, have a CBE every year. Also consider having a breast X-ray (mammogram) every year.  If you have a family history of breast cancer, talk to your health care provider about genetic screening.  If you are at high risk for breast cancer, talk to your health care provider about having an MRI and a mammogram every year.  Breast cancer gene (BRCA) assessment is recommended for women who have family members with BRCA-related cancers. BRCA-related  cancers include:  Breast.  Ovarian.  Tubal.  Peritoneal cancers.  Results of the assessment will determine the need for genetic counseling and BRCA1 and BRCA2 testing. Cervical Cancer Your health care provider may recommend that you be screened regularly for cancer of the pelvic organs (ovaries, uterus, and vagina). This screening involves a pelvic examination, including checking for microscopic changes to the surface of your cervix (Pap test). You may be encouraged to have this screening done every 3 years, beginning at age 37.  For women ages 25-65, health care providers may recommend pelvic exams and Pap testing every 3 years, or they may recommend the Pap and pelvic exam, combined with testing for human papilloma virus (HPV), every 5 years. Some types of HPV increase your risk of cervical cancer. Testing for HPV may also be done on women of any age with unclear Pap test results.  Other health care providers may not recommend any screening for nonpregnant women who are considered low risk for pelvic cancer and who do not have symptoms. Ask your health care provider if a screening pelvic exam is right for you.  If you have had past treatment for cervical cancer or a condition that could lead to cancer, you need Pap tests and screening for cancer for at least 20 years after your treatment. If Pap tests have been discontinued, your risk factors (such as having a new sexual partner) need to be reassessed to determine if screening should resume. Some women have medical problems that increase the chance of getting cervical cancer. In these cases, your health care provider may recommend more frequent screening and Pap tests. Colorectal Cancer  This type of cancer can be detected and often prevented.  Routine colorectal cancer screening usually begins at 74 years of age and continues through 74 years of age.  Your health care provider may recommend screening at an earlier age if you have risk  factors for colon cancer.  Your health care provider may also recommend using home test kits to check for hidden blood in the stool.  A small camera at the end of a tube can be used to examine your colon directly (sigmoidoscopy or colonoscopy). This is done to check for the earliest forms of colorectal cancer.  Routine screening usually begins at age 15.  Direct examination of the colon should be repeated every 5-10 years through 74 years of age. However, you may need to be screened more often if early forms of precancerous polyps or small growths are found. Skin Cancer  Check your skin from head to toe regularly.  Tell your health care provider about any new moles or changes in moles, especially if there is a change in a mole's shape or color.  Also tell your health care provider if  you have a mole that is larger than the size of a pencil eraser.  Always use sunscreen. Apply sunscreen liberally and repeatedly throughout the day.  Protect yourself by wearing long sleeves, pants, a wide-brimmed hat, and sunglasses whenever you are outside. HEART DISEASE, DIABETES, AND HIGH BLOOD PRESSURE   High blood pressure causes heart disease and increases the risk of stroke. High blood pressure is more likely to develop in:  People who have blood pressure in the high end of the normal range (130-139/85-89 mm Hg).  People who are overweight or obese.  People who are African American.  If you are 39-83 years of age, have your blood pressure checked every 3-5 years. If you are 52 years of age or older, have your blood pressure checked every year. You should have your blood pressure measured twice--once when you are at a hospital or clinic, and once when you are not at a hospital or clinic. Record the average of the two measurements. To check your blood pressure when you are not at a hospital or clinic, you can use:  An automated blood pressure machine at a pharmacy.  A home blood pressure  monitor.  If you are between 19 years and 3 years old, ask your health care provider if you should take aspirin to prevent strokes.  Have regular diabetes screenings. This involves taking a blood sample to check your fasting blood sugar level.  If you are at a normal weight and have a low risk for diabetes, have this test once every three years after 74 years of age.  If you are overweight and have a high risk for diabetes, consider being tested at a younger age or more often. PREVENTING INFECTION  Hepatitis B  If you have a higher risk for hepatitis B, you should be screened for this virus. You are considered at high risk for hepatitis B if:  You were born in a country where hepatitis B is common. Ask your health care provider which countries are considered high risk.  Your parents were born in a high-risk country, and you have not been immunized against hepatitis B (hepatitis B vaccine).  You have HIV or AIDS.  You use needles to inject street drugs.  You live with someone who has hepatitis B.  You have had sex with someone who has hepatitis B.  You get hemodialysis treatment.  You take certain medicines for conditions, including cancer, organ transplantation, and autoimmune conditions. Hepatitis C  Blood testing is recommended for:  Everyone born from [birth year] through 1964.  Anyone with known risk factors for hepatitis C. Sexually transmitted infections (STIs)  You should be screened for sexually transmitted infections (STIs) including gonorrhea and chlamydia if:  You are sexually active and are younger than 74 years of age.  You are older than 74 years of age and your health care provider tells you that you are at risk for this type of infection.  Your sexual activity has changed since you were last screened and you are at an increased risk for chlamydia or gonorrhea. Ask your health care provider if you are at risk.  If you do not have HIV, but are at risk, it may be  recommended that you take a prescription medicine daily to prevent HIV infection. This is called pre-exposure prophylaxis (PrEP). You are considered at risk if:  You are sexually active and do not regularly use condoms or know the HIV status of your partner(s).  You take drugs by injection.  You are sexually active with a partner who has HIV. Talk with your health care provider about whether you are at high risk of being infected with HIV. If you choose to begin PrEP, you should first be tested for HIV. You should then be tested every 3 months for as long as you are taking PrEP.  PREGNANCY   If you are premenopausal and you may become pregnant, ask your health care provider about preconception counseling.  If you may become pregnant, take 400 to 800 micrograms (mcg) of folic acid every day.  If you want to prevent pregnancy, talk to your health care provider about birth control (contraception). OSTEOPOROSIS AND MENOPAUSE   Osteoporosis is a disease in which the bones lose minerals and strength with aging. This can result in serious bone fractures. Your risk for osteoporosis can be identified using a bone density scan.  If you are 65 years of age or older, or if you are at risk for osteoporosis and fractures, ask your health care provider if you should be screened.  Ask your health care provider whether you should take a calcium or vitamin D supplement to lower your risk for osteoporosis.  Menopause may have certain physical symptoms and risks.  Hormone replacement therapy may reduce some of these symptoms and risks. Talk to your health care provider about whether hormone replacement therapy is right for you.  HOME CARE INSTRUCTIONS   Schedule regular health, dental, and eye exams.  Stay current with your immunizations.   Do not use any tobacco products including cigarettes, chewing tobacco, or electronic cigarettes.  If you are pregnant, do not drink alcohol.  If you are  breastfeeding, limit how much and how often you drink alcohol.  Limit alcohol intake to no more than 1 drink per day for nonpregnant women. One drink equals 12 ounces of beer, 5 ounces of wine, or 1 ounces of hard liquor.  Do not use street drugs.  Do not share needles.  Ask your health care provider for help if you need support or information about quitting drugs.  Tell your health care provider if you often feel depressed.  Tell your health care provider if you have ever been abused or do not feel safe at home.   This information is not intended to replace advice given to you by your health care provider. Make sure you discuss any questions you have with your health care provider.   Document Released: 10/09/2010 Document Revised: 04/16/2014 Document Reviewed: 02/25/2013 Elsevier Interactive Patient Education [employer].

## 2015-04-21 NOTE — Progress Notes (Signed)
Subjective:    Patient ID: Rhonda Garcia, female    DOB: Sep 12, 1940, 75 y.o.   MRN: [id number]  Pt presents to the office today for chronic follow up.  Diabetes She presents for her follow-up diabetic visit. She has type 2 diabetes mellitus. Her disease course has been fluctuating. Hypoglycemia symptoms include nervousness/anxiousness. Pertinent negatives for hypoglycemia include no confusion, dizziness or headaches. Associated symptoms include visual change. Pertinent negatives for diabetes include no fatigue, no foot paresthesias and no foot ulcerations. There are no hypoglycemic complications. Symptoms are stable. Diabetic complications include heart disease and nephropathy. Pertinent negatives for diabetic complications include no CVA or peripheral neuropathy. Risk factors for coronary artery disease include dyslipidemia, family history, hypertension, post-menopausal, stress and diabetes mellitus. Current diabetic treatment includes oral agent (dual therapy). She is compliant with treatment all of the time. She is following a generally unhealthy diet. She participates in exercise weekly. Her breakfast blood glucose range is generally 180-200 mg/dl. ( ) An ACE inhibitor/angiotensin II receptor blocker is being taken. Eye exam is current.  Hypertension This is a chronic problem. The current episode started more than 1 year ago. The problem has been resolved since onset. The problem is controlled. Associated symptoms include anxiety. Pertinent negatives include no headaches, palpitations, peripheral edema or shortness of breath. Risk factors for coronary artery disease include diabetes mellitus, dyslipidemia, family history, obesity and post-menopausal state. Past treatments include ACE inhibitors, diuretics, calcium channel blockers and beta blockers. The current treatment provides moderate improvement. Hypertensive end-organ damage includes CAD/MI. There is no history of kidney disease, CVA, heart  failure or a thyroid problem. Identifiable causes of hypertension include sleep apnea.  Hyperlipidemia This is a chronic problem. The current episode started more than 1 year ago. The problem is controlled. Recent lipid tests were reviewed and are normal. Exacerbating diseases include diabetes. She has no history of hypothyroidism. Pertinent negatives include no leg pain or shortness of breath. Current antihyperlipidemic treatment includes statins. The current treatment provides moderate improvement of lipids. Risk factors for coronary artery disease include diabetes mellitus, dyslipidemia, family history, hypertension, obesity, post-menopausal and a sedentary lifestyle.  Gastroesophageal Reflux She reports no belching, no heartburn or no nausea. This is a chronic problem. The current episode started more than 1 year ago. The problem occurs rarely. The problem has been resolved. The symptoms are aggravated by certain foods. Pertinent negatives include no fatigue. She has tried a PPI and a diet change for the symptoms. The treatment provided significant relief.  Anxiety Presents for follow-up visit. Onset was 1 to 6 months ago. The problem has been rapidly worsening. Symptoms include depressed mood, excessive worry, insomnia and nervous/anxious behavior. Patient reports no confusion, dizziness, irritability, nausea, palpitations or shortness of breath. Symptoms occur occasionally. The severity of symptoms is mild. The symptoms are aggravated by family issues and work stress.   Her past medical history is significant for anxiety/panic attacks and depression. There is no history of CAD. Past treatments include SSRIs.  Constipation This is a chronic problem. The current episode started more than 1 year ago. The problem is unchanged. Her stool frequency is 4 to 5 times per week. The stool is described as firm. Associated symptoms include bloating. Pertinent negatives include no hematochezia or nausea. The  treatment provided mild relief.  Thyroid Problem Presents for follow-up visit. Symptoms include anxiety, constipation, depressed mood and visual change. Patient reports no fatigue or palpitations. The symptoms have been improving. Past treatments include levothyroxine. The treatment provided moderate  relief. Her past medical history is significant for diabetes and hyperlipidemia. There is no history of heart failure.      Review of Systems  Constitutional: Negative.  Negative for irritability and fatigue.  HENT: Negative.   Eyes: Negative.   Respiratory: Negative.  Negative for shortness of breath.   Cardiovascular: Negative.  Negative for palpitations.  Gastrointestinal: Positive for constipation and bloating. Negative for heartburn, nausea and hematochezia.  Endocrine: Negative.   Genitourinary: Negative.   Musculoskeletal: Negative.   Neurological: Negative.  Negative for dizziness and headaches.  Hematological: Negative.   Psychiatric/Behavioral: Negative for confusion. The patient is nervous/anxious and has insomnia.   All other systems reviewed and are negative.      Objective:   Physical Exam  Constitutional: She is oriented to person, place, and time. She appears well-developed and well-nourished. No distress.  HENT:  Head: Normocephalic and atraumatic.  Right Ear: External ear normal.  Left Ear: External ear normal.  Nose: Nose normal.  Mouth/Throat: Oropharynx is clear and moist.  Eyes: Pupils are equal, round, and reactive to light.  Neck: Normal range of motion. Neck supple. No thyromegaly present.  Cardiovascular: Normal rate, regular rhythm, normal heart sounds and intact distal pulses.   No murmur heard. Pulmonary/Chest: Effort normal and breath sounds normal. No respiratory distress. She has no wheezes.  Abdominal: Soft. Bowel sounds are normal. She exhibits no distension. There is no tenderness.  Musculoskeletal: Normal range of motion. She exhibits no edema or  tenderness.  Neurological: She is alert and oriented to person, place, and time. She has normal reflexes. No cranial nerve deficit.  Skin: Skin is warm and dry.  Psychiatric: She has a normal mood and affect. Her behavior is normal. Judgment and thought content normal.  Vitals reviewed.     BP 132/65 mmHg  Pulse 57  Temp(Src) 97.1 F (36.2 C) (Oral)  Ht 5' 1.5" (1.562 m)  Wt 200 lb (90.719 kg)  BMI 37.18 kg/m2     Assessment & Plan:  1. Essential hypertension - CMP14+EGFR  2. Atherosclerosis of native coronary artery of native heart without angina pectoris - CMP14+EGFR  3. Gastroesophageal reflux disease without esophagitis - CMP14+EGFR  4. Hypothyroidism, unspecified hypothyroidism type - CMP14+EGFR - Thyroid Panel With TSH  5. Type 2 diabetes mellitus without complication, without long-term current use of insulin (HCC) - POCT glycosylated hemoglobin (Hb A1C) - CMP14+EGFR - Ambulatory referral to Ophthalmology  6. Depression - CMP14+EGFR  7. Hyperlipidemia - CMP14+EGFR - Lipid panel  8. Vitamin D deficiency - CMP14+EGFR - VITAMIN D 25 Hydroxy (Vit-D Deficiency, Fractures)  9. Osteoarthritis, unspecified osteoarthritis type, unspecified site - CMP14+EGFR - HYDROcodone-acetaminophen (NORCO/VICODIN) 5-325 MG tablet; Take 1 tablet by mouth every 6 (six) hours as needed for moderate pain.  Dispense: 30 tablet; Refill: 0  10. Long term current use of anticoagulant therapy - CMP14+EGFR  11. Constipation, unspecified constipation type - CMP14+EGFR  12. Paroxysmal atrial fibrillation (HCC) - CMP14+EGFR  13. Cramp of both lower extremities - CMP14+EGFR - Magnesium   Continue all meds Labs pending Health Maintenance reviewed Diet and exercise encouraged RTO 3 months  Evelina Dun, FNP

## 2015-04-22 LAB — CMP14+EGFR
ALT: 18 IU/L (ref 0–32)
AST: 21 IU/L (ref 0–40)
Albumin/Globulin Ratio: 2 (ref 1.1–2.5)
Albumin: 4 g/dL (ref 3.5–4.8)
Alkaline Phosphatase: 77 IU/L (ref 39–117)
BUN/Creatinine Ratio: 20 (ref 11–26)
BUN: 17 mg/dL (ref 8–27)
Bilirubin Total: 0.5 mg/dL (ref 0.0–1.2)
CO2: 24 mmol/L (ref 18–29)
Calcium: 9.4 mg/dL (ref 8.7–10.3)
Chloride: 103 mmol/L (ref 96–106)
Creatinine, Ser: 0.83 mg/dL (ref 0.57–1.00)
GFR calc Af Amer: 81 mL/min/{1.73_m2} (ref >59)
GFR calc non Af Amer: 70 mL/min/{1.73_m2} (ref >59)
Globulin, Total: 2 g/dL (ref 1.5–4.5)
Glucose: 128 mg/dL — ABNORMAL HIGH (ref 65–99)
Potassium: 4.3 mmol/L (ref 3.5–5.2)
Sodium: 141 mmol/L (ref 134–144)
Total Protein: 6 g/dL (ref 6.0–8.5)

## 2015-04-22 LAB — LIPID PANEL
Chol/HDL Ratio: 3.7 ratio units (ref 0.0–4.4)
Cholesterol, Total: 169 mg/dL (ref 100–199)
HDL: 46 mg/dL (ref >39)
LDL Calculated: 105 mg/dL — ABNORMAL HIGH (ref 0–99)
Triglycerides: 91 mg/dL (ref 0–149)
VLDL Cholesterol Cal: 18 mg/dL (ref 5–40)

## 2015-04-22 LAB — THYROID PANEL WITH TSH
Free Thyroxine Index: 2.1 (ref 1.2–4.9)
T3 Uptake Ratio: 30 % (ref 24–39)
T4, Total: 7 ug/dL (ref 4.5–12.0)
TSH: 1.75 u[IU]/mL (ref 0.450–4.500)

## 2015-04-22 LAB — VITAMIN D 25 HYDROXY (VIT D DEFICIENCY, FRACTURES): Vit D, 25-Hydroxy: 30.6 ng/mL (ref 30.0–100.0)

## 2015-04-22 LAB — MAGNESIUM: Magnesium: 1.9 mg/dL (ref 1.6–2.3)

## 2015-05-02 ENCOUNTER — Encounter: Payer: Self-pay | Admitting: Pharmacist

## 2015-05-02 ENCOUNTER — Other Ambulatory Visit: Payer: Self-pay | Admitting: Family

## 2015-05-26 ENCOUNTER — Encounter: Payer: Self-pay | Admitting: Internal Medicine

## 2015-05-26 ENCOUNTER — Encounter: Payer: Self-pay | Admitting: Pharmacist

## 2015-06-09 ENCOUNTER — Other Ambulatory Visit: Payer: Self-pay | Admitting: Family

## 2015-06-14 ENCOUNTER — Encounter: Payer: Self-pay | Admitting: Family

## 2015-06-14 ENCOUNTER — Ambulatory Visit (INDEPENDENT_AMBULATORY_CARE_PROVIDER_SITE_OTHER): Payer: Medicare Other | Admitting: Family

## 2015-06-14 VITALS — BP 154/70 | HR 61 | Temp 97.4°F | Ht 61.5 in | Wt 204.8 lb

## 2015-06-14 DIAGNOSIS — I10 Essential (primary) hypertension: Secondary | ICD-10-CM

## 2015-06-14 DIAGNOSIS — D689 Coagulation defect, unspecified: Secondary | ICD-10-CM | POA: Diagnosis not present

## 2015-06-14 DIAGNOSIS — R079 Chest pain, unspecified: Secondary | ICD-10-CM | POA: Diagnosis not present

## 2015-06-14 DIAGNOSIS — I480 Paroxysmal atrial fibrillation: Secondary | ICD-10-CM

## 2015-06-14 DIAGNOSIS — S29011A Strain of muscle and tendon of front wall of thorax, initial encounter: Secondary | ICD-10-CM

## 2015-06-14 DIAGNOSIS — I2510 Atherosclerotic heart disease of native coronary artery without angina pectoris: Secondary | ICD-10-CM

## 2015-06-14 DIAGNOSIS — Z7901 Long term (current) use of anticoagulants: Secondary | ICD-10-CM

## 2015-06-14 LAB — COAGUCHEK XS/INR WAIVED
INR: 1.5 — ABNORMAL HIGH (ref 0.9–1.1)
Prothrombin Time: 17.8 s

## 2015-06-14 MED ORDER — CYCLOBENZAPRINE HCL 5 MG PO TABS: 5.0000 mg | ORAL_TABLET | Freq: Three times a day (TID) | ORAL | Status: DC | PRN

## 2015-06-14 MED ORDER — NAPROXEN 500 MG PO TABS: 500.0000 mg | ORAL_TABLET | Freq: Two times a day (BID) | ORAL | Status: DC

## 2015-06-14 NOTE — Progress Notes (Signed)
Subjective:    Patient ID: Rhonda Garcia, female    DOB: 10-18-1940, 75 y.o.   MRN: [id number]  PT presents to the office today with chest pain and shoulder pain. PT states she has been caring for cousin who is bed bound. Pt states she believes she "strained" something while moving her cousin around.  Chest Pain  This is a new problem. The current episode started in the past 7 days. The onset quality is sudden. The problem occurs 2 to 4 times per day. The problem has been unchanged. The pain is present in the substernal region. The pain is at a severity of 7/10. The quality of the pain is described as dull, pressure and tightness. The pain radiates to the left arm. Associated symptoms include back pain, a cough, exertional chest pressure, irregular heartbeat and nausea. Pertinent negatives include no abdominal pain, diaphoresis, headaches, orthopnea, palpitations, shortness of breath or syncope. The cough has no precipitants. The cough is productive. Nothing relieves the cough. The pain is aggravated by exertion. The treatment provided no relief. Risk factors include being elderly.  Back Pain This is a new problem. The current episode started in the past 7 days. The problem occurs 2 to 4 times per day. The pain is present in the thoracic spine. The quality of the pain is described as aching. Associated symptoms include chest pain. Pertinent negatives include no abdominal pain or headaches.      Review of Systems  Constitutional: Negative.  Negative for diaphoresis.  HENT: Negative.   Eyes: Negative.   Respiratory: Positive for cough. Negative for shortness of breath.   Cardiovascular: Positive for chest pain. Negative for palpitations, orthopnea and syncope.  Gastrointestinal: Positive for nausea. Negative for abdominal pain.  Endocrine: Negative.   Genitourinary: Negative.   Musculoskeletal: Positive for back pain.  Neurological: Negative.  Negative for headaches.  Hematological: Negative.    Psychiatric/Behavioral: Negative.   All other systems reviewed and are negative.      Objective:   Physical Exam  Constitutional: She is oriented to person, place, and time. She appears well-developed and well-nourished. No distress.  HENT:  Head: Normocephalic and atraumatic.  Right Ear: External ear normal.  Left Ear: External ear normal.  Nose: Nose normal.  Mouth/Throat: Oropharynx is clear and moist.  Eyes: Pupils are equal, round, and reactive to light.  Neck: Normal range of motion. Neck supple. No thyromegaly present.  Cardiovascular: Normal rate, regular rhythm, normal heart sounds and intact distal pulses.   No murmur heard. Pulmonary/Chest: Effort normal and breath sounds normal. No respiratory distress. She has no wheezes.  Abdominal: Soft. Bowel sounds are normal. She exhibits no distension. There is no tenderness.  Musculoskeletal: Normal range of motion. She exhibits no edema or tenderness.  Neurological: She is alert and oriented to person, place, and time. She has normal reflexes. No cranial nerve deficit.  Skin: Skin is warm and dry.  Psychiatric: She has a normal mood and affect. Her behavior is normal. Judgment and thought content normal.  Vitals reviewed.   BP 154/70 mmHg  Pulse 61  Temp(Src) 97.4 F (36.3 C) (Oral)  Ht 5' 1.5" (1.562 m)  Wt 204 lb 12.8 oz (92.897 kg)  BMI 38.07 kg/m2       Assessment & Plan:  1. Chest pain, unspecified chest pain type - EKG 12-Lead - Ambulatory referral to Cardiology  2. Chest wall muscle strain, initial encounter - cyclobenzaprine (FLEXERIL) 5 MG tablet; Take 1 tablet (5 mg total)  by mouth 3 (three) times daily as needed for muscle spasms.  Dispense: 30 tablet; Refill: 0 - naproxen (NAPROSYN) 500 MG tablet; Take 1 tablet (500 mg total) by mouth 2 (two) times daily with a meal.  Dispense: 30 tablet; Refill: 0  3. Atherosclerosis of native coronary artery of native heart without angina pectoris - Ambulatory  referral to Cardiology  4. Essential hypertension - Ambulatory referral to Cardiology  PT told she needed to follow up with Cardiologists Has been over 2 years since her last visit I believe this is MSK related, but have discussed in depth that if pain becomes worse or develops SOB to go to ED RTO in 2 weeks if not improved, told  Patient to avoid lifting and moving heavy things for next 1-2 weeks Sedation precautions discussed with patient  Evelina Dun, FNP

## 2015-06-14 NOTE — Addendum Note (Signed)
Addended by: Shelbie Ammons on: 06/14/2015 10:51 AM   Modules accepted: Orders

## 2015-06-14 NOTE — Patient Instructions (Addendum)
Chest Wall Pain Chest wall pain is pain in or around the bones and muscles of your chest. Sometimes, an injury causes this pain. Sometimes, the cause may not be known. This pain may take several weeks or longer to get better. HOME CARE INSTRUCTIONS  Pay attention to any changes in your symptoms. Take these actions to help with your pain:   Rest as told by your health care provider.   Avoid activities that cause pain. These include any activities that use your chest muscles or your abdominal and side muscles to lift heavy items.   If directed, apply ice to the painful area:  Put ice in a plastic bag.  Place a towel between your skin and the bag.  Leave the ice on for 20 minutes, 2-3 times per day.  Take over-the-counter and prescription medicines only as told by your health care provider.  Do not use tobacco products, including cigarettes, chewing tobacco, and e-cigarettes. If you need help quitting, ask your health care provider.  Keep all follow-up visits as told by your health care provider. This is important. SEEK MEDICAL CARE IF:  You have a fever.  Your chest pain becomes worse.  You have new symptoms. SEEK IMMEDIATE MEDICAL CARE IF:  You have nausea or vomiting.  You feel sweaty or light-headed.  You have a cough with phlegm (sputum) or you cough up blood.  You develop shortness of breath.   This information is not intended to replace advice given to you by your health care provider. Make sure you discuss any questions you have with your health care provider.   Document Released: 03/26/2005 Document Revised: 12/15/2014 Document Reviewed: 06/21/2014 Elsevier Interactive Patient Education 2016 [place].  Anticoagulation Dose Instructions as of 06/14/2015      Rhonda Garcia Tue Wed Thu Fri Sat   New Dose 5 mg 7.5 mg 5 mg 5 mg 5 mg 5 mg 5 mg    Description        Take 1 and 1/2 tablets today- March 7.  Then increase warfarin dose to 5mg  - take 1 tablet daily except  take 1 and 1/2 tablets on mondays.

## 2015-06-22 ENCOUNTER — Ambulatory Visit (INDEPENDENT_AMBULATORY_CARE_PROVIDER_SITE_OTHER): Payer: Medicare Other | Admitting: Gastroenterology

## 2015-06-22 ENCOUNTER — Encounter: Payer: Self-pay | Admitting: Gastroenterology

## 2015-06-22 VITALS — BP 160/83 | HR 63 | Temp 97.0°F | Ht 62.0 in | Wt 205.0 lb

## 2015-06-22 DIAGNOSIS — R1314 Dysphagia, pharyngoesophageal phase: Secondary | ICD-10-CM

## 2015-06-22 DIAGNOSIS — K219 Gastro-esophageal reflux disease without esophagitis: Secondary | ICD-10-CM | POA: Diagnosis not present

## 2015-06-22 DIAGNOSIS — Z86010 Personal history of colonic polyps: Secondary | ICD-10-CM

## 2015-06-22 DIAGNOSIS — K625 Hemorrhage of anus and rectum: Secondary | ICD-10-CM | POA: Diagnosis not present

## 2015-06-22 DIAGNOSIS — K5900 Constipation, unspecified: Secondary | ICD-10-CM

## 2015-06-22 DIAGNOSIS — R1310 Dysphagia, unspecified: Secondary | ICD-10-CM

## 2015-06-22 DIAGNOSIS — R1319 Other dysphagia: Secondary | ICD-10-CM

## 2015-06-22 MED ORDER — LINACLOTIDE 145 MCG PO CAPS: 145.0000 ug | ORAL_CAPSULE | Freq: Every day | ORAL | Status: DC

## 2015-06-22 NOTE — Patient Instructions (Addendum)
1. Colonoscopy and upper endoscopy with Dr. Gala Romney. See separate instructions.  2. We will be in touch with Tessie Fass, PHARMD at [facility] regarding your coumadin and instructions for Lovenox.  3. You need to start something to manage your constipation. I have sent a RX for Linzess to your pharmacy. If too expensive, I would recommend taking Miralax one capful twice daily until regular BM, then back down to once daily as needed.  4. I HAVE PROVIDED YOU WITH SAMPLES OF LINZESS. YOU CAN USE ONE A DAY THE WEEK BEFORE YOUR COLONOSCOPY TO MAKE SURE YOU ARE HAVING REGULAR BOWEL MOVEMENTS AND WILL HAVE A SUCCESSFUL BOWEL CLEANSING FOR YOUR PROCEDURES.

## 2015-06-22 NOTE — Progress Notes (Signed)
Primary Care Physician: Evelina Dun, FNP  Primary Gastroenterologist:  Garfield Cornea, MD   Chief Complaint  Patient presents with  . Hemorrhoids  . Constipation  . set up TCS    HPI: Rhonda Garcia is a 75 y.o. female here follow-up. She was last seen in February 2016. She underwent an EGD in March 2016 along with a colonoscopy. She had small hiatal hernia, normal-appearing esophagus status post dilation, look back revealed a superficial tear at the upper esophageal sphincter. Colonoscopy with colonic diverticulosis, multiple tubular adenomas removed, one was a carpet polyp with difficult approach. Next colonoscopy recommended in one year.  She has some issues with constipation. Takes probiotics which helps some. Notes if she takes metformin bid her stools are more regular. She often holds metformin if going somewhere during the day. Intermittent rectal bleeding with constipation. Takes hydrocodone only when need it. Having trouble taking pills and bread again. Dilation helped for about a year. No heartburn. Has had some chest wall pain, has to help move/lift cousin (paralyzed) twice a week while sitting for her.  Current Outpatient Prescriptions  Medication Sig Dispense Refill  . ALPRAZolam (XANAX) 0.5 MG tablet Take 1 tablet (0.5 mg total) by mouth at bedtime as needed for anxiety. 60 tablet 3  . amLODipine (NORVASC) 10 MG tablet Take 1 tablet (10 mg total) by mouth daily. 90 tablet 3  . cyclobenzaprine (FLEXERIL) 5 MG tablet Take 1 tablet (5 mg total) by mouth 3 (three) times daily as needed for muscle spasms. 30 tablet 0  . HYDROcodone-acetaminophen (NORCO/VICODIN) 5-325 MG tablet Take 1 tablet by mouth every 6 (six) hours as needed for moderate pain. 30 tablet 0  . isosorbide mononitrate (IMDUR) 30 MG 24 hr tablet Take 1 tablet (30 mg total) by mouth daily. 90 tablet 3  . levothyroxine (LEVOTHROID) 25 MCG tablet Take 1 tablet (25 mcg total) by mouth daily before breakfast.  90 tablet 1  . lisinopril-hydrochlorothiazide (PRINZIDE,ZESTORETIC) 20-12.5 MG per tablet Take 2 tablets by mouth daily. 180 tablet 6  . metFORMIN (GLUCOPHAGE) 500 MG tablet TAKE TWO TABLETS BY MOUTH EVERY MORNING 180 tablet 2  . metoprolol (LOPRESSOR) 50 MG tablet TAKE ONE TABLET BY MOUTH TWICE DAILY 60 tablet 5  . naproxen (NAPROSYN) 500 MG tablet Take 1 tablet (500 mg total) by mouth 2 (two) times daily with a meal. 30 tablet 0  . nitroGLYCERIN (NITROSTAT) 0.4 MG SL tablet Place 0.4 mg under the tongue every 5 (five) minutes as needed.      . pravastatin (PRAVACHOL) 80 MG tablet Take 1 tablet (80 mg total) by mouth daily. 90 tablet 3  . ranitidine (ZANTAC) 150 MG tablet TAKE ONE TABLET BY MOUTH TWICE DAILY 60 tablet 3  . sertraline (ZOLOFT) 100 MG tablet Take 1 tablet (100 mg total) by mouth daily. 90 tablet 0  . Vitamin D, Ergocalciferol, (DRISDOL) 50000 UNITS CAPS capsule Take 1 capsule (50,000 Units total) by mouth every 7 (seven) days. 30 capsule 6  . warfarin (COUMADIN) 5 MG tablet TAKE ONE (1) TABLET EACH DAY 30 tablet 1   No current facility-administered medications for this visit.    Allergies as of 06/22/2015 - Review Complete 06/22/2015  Allergen Reaction Noted  . Promethazine hcl Anaphylaxis 09/06/2010  . Penicillins  03/13/2011  . Promethazine hcl Other (See Comments)    Past Medical History  Diagnosis Date  . Diabetes mellitus   . CAD (coronary artery disease)   . GERD (gastroesophageal reflux  disease)   . Osteopenia   . Hypertension   . Hyperlipidemia   . Anal fissure     resolved   . Chronic pain   . Osteoarthritis   . DDD (degenerative disc disease)   . Anemia   . Airway compromise     Inability to tolerate continuous positive airway pressure  . Hiatal hernia   . Dyslipidemia   . Thyroid disease   . Hearing loss   . Generalized headaches   . Sebaceous cyst     on back  . Back pain     with left radiculopathy  . Hx of myocardial infarction   .  Depression   . Hx of peptic ulcer   . A-fib ([place])   . MI (myocardial infarction) ([place]) 2000  . Sleep apnea     not using CPAP now, could not tolerate and PCP aware.   Past Surgical History  Procedure Laterality Date  . Knee surgery Left     arthroscopy  . Appendectomy    . Breast reduction surgery    . Esophagogastroduodenoscopy  06/2007    Small hiatal hernia  . Colonoscopy  06/2007    Friable anal canal and anal papillae. Pancolonic diverticula. Normal terminal ileum. Status post segmental biopsy, descending colon biopsy showed minimal cryptitis. Biopsies felt to be  nonspecific but could be seen with NSAIDs. No features of inflammatory bowel disease. Stool studies were negative.  . Colonoscopy  03/21/2011    RMR: colonic polyps treated as described above, colonic diverticulosis (Tubular adenomas)  . Cholecystectomy  2008 - approximate  . Abdominal hysterectomy  2001 - approximate  . Lipoma excision  03/17/2012    Procedure: EXCISION LIPOMA;  Surgeon: Gwenyth Ober, MD;  Location: [facility];  Service: General;  Laterality: Right;  excision of right lower back lipoma  . Esophagogastroduodenoscopy  03/21/2011    RMR: normal esophagus- status post passage of Maloney dilator. Small hiatal hernia. Trivial antral and bulbar erosions  . Coronary angioplasty with stent placement      4 stents  . Esophagogastroduodenoscopy (egd) with propofol N/A 06/17/2014    RMR: Small hiatal hernia; otherwise normal EGD. Status post passage of a Maloney dilator. No explanation for patients right upper quadrant abdominal pain.   . Esophageal dilation N/A 06/17/2014    Procedure: ESOPHAGEAL DILATION;  Surgeon: Daneil Dolin, MD;  Location: [facility];  Service: Endoscopy;  Laterality: N/A;  Maloney 23 Fr  . Colonoscopy with propofol N/A 06/17/2014    RMR: Colonic diverticulosis. Multiple colonic polyps removed as described above.   . Polypectomy  06/17/2014    Procedure: POLYPECTOMY;  Surgeon:  Daneil Dolin, MD;  Location: [facility];  Service: Endoscopy;;   Family History  Problem Relation Age of Onset  . Heart attack Mother   . Hypertension Mother   . Stroke Mother     Deceased, age 86  . Diabetes Sister   . Diabetes Sister   . Cancer Sister     ovarian  . Stroke Sister   . Diabetes Brother     also has a blood disorder   . Clotting disorder Brother   . Melanoma Brother     deceased  . Coronary artery disease Other   . Kidney disease Other   . Colon cancer Neg Hx    Social History   Social History  . Marital Status: Widowed    Spouse Name: N/A  . Number of Children: 3  .  Years of Education: N/A   Occupational History  . Retired    Social History Main Topics  . Smoking status: Former Smoker -- 0.50 packs/day for 5 years    Types: Cigarettes    Quit date: 03/13/1981  . Smokeless tobacco: Never Used     Comment: quit 30 +years ago  . Alcohol Use: No  . Drug Use: No  . Sexual Activity: Yes   Other Topics Concern  . None   Social History Narrative   Married    ROS:  General: Negative for anorexia, weight loss, fever, chills, fatigue, weakness. ENT: Negative for hoarseness, difficulty swallowing , nasal congestion. CV: Negative for chest pain, angina, palpitations, dyspnea on exertion, peripheral edema.  Respiratory: Negative for dyspnea at rest, dyspnea on exertion, cough, sputum, wheezing.  GI: See history of present illness. GU:  Negative for dysuria, hematuria, urinary incontinence, urinary frequency, nocturnal urination.  Endo: Negative for unusual weight change.    Physical Examination:   BP 160/83 mmHg  Pulse 63  Temp(Src) 97 F (36.1 C)  Ht 5\' 2"  (1.575 m)  Wt 205 lb (92.987 kg)  BMI 37.49 kg/m2  General: Well-nourished, well-developed in no acute distress.  Eyes: No icterus. Mouth: Oropharyngeal mucosa moist and pink , no lesions erythema or exudate. Lungs: Clear to auscultation bilaterally.  Heart: Regular rate and rhythm, no  murmurs rubs or gallops.  Abdomen: Bowel sounds are normal, nontender, nondistended, no hepatosplenomegaly or masses, no abdominal bruits or hernia , no rebound or guarding.   Extremities: No lower extremity edema. No clubbing or deformities. Neuro: Alert and oriented x 4   Skin: Warm and dry, no jaundice.   Psych: Alert and cooperative, normal mood and affect.  Labs:  Lab Results  Component Value Date   CREATININE 0.83 04/21/2015   BUN 17 04/21/2015   NA 141 04/21/2015   K 4.3 04/21/2015   CL 103 04/21/2015   CO2 24 04/21/2015   Lab Results  Component Value Date   ALT 18 04/21/2015   AST 21 04/21/2015   ALKPHOS 77 04/21/2015   BILITOT 0.5 04/21/2015   Lab Results  Component Value Date   WBC 5.3 06/14/2014   HGB 12.2 06/14/2014   HCT 37.6 06/14/2014   MCV 93.5 06/14/2014   PLT 174 06/14/2014    Imaging Studies: No results found.

## 2015-06-23 ENCOUNTER — Telehealth: Payer: Self-pay | Admitting: Gastroenterology

## 2015-06-23 NOTE — Telephone Encounter (Signed)
Patient is scheduled for an EGD/colonoscopy on 07/18/2015. She previously required Lovenox bridge. Would you be willing to assist [place] again.  With Lovenox twice a day dosing, we like to hold Lovenox for 12 hours before procedure.

## 2015-06-23 NOTE — Progress Notes (Signed)
CC'ED TO PCP 

## 2015-06-23 NOTE — Assessment & Plan Note (Signed)
Esophageal dysphagia last year with superficial tear at upper esophageal sphincter after dilation. Symptoms resolved for nearly one year. We discussed options of barium pill esophagram at this point however given that she responded to prior dilation and that she is undergoing colonoscopy in the near future we agreed on EGD with dilation at this time. If she does not have long lived results then we would proceed with BPE afterwards. Also opted for upper endoscopy because she will be coming off of her Coumadin, requiring Lovenox bridge at time of colonoscopy.

## 2015-06-23 NOTE — Assessment & Plan Note (Signed)
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 She will require Lovenox bridge again, we will ask [name] Eckard, Pharm.D. to assist. Plan on deep sedation in the OR due to polypharmacy.  I have discussed the risks, alternatives, benefits with regards to but not limited to the risk of reaction to medication, bleeding, infection, perforation and the patient is agreeable to proceed. Written consent to be obtained.  She has had some constipation, previously had been on Amitiza but developed nausea which she could not tolerate. Trial of Linzess 145 g daily on any of the stomach. Prescription called to pharmacy. If it is not affordable, recommend MiraLAX 1 capful twice a day until regular bowel movements and then once daily as needed. Reiterated the importance of having regular bowel movements preceding colonoscopy bowel prep. We have provided her with Linzess samples to utilize if needed.

## 2015-06-23 NOTE — Telephone Encounter (Signed)
Patient to take last dose of warfarin Tuesday, April 4th.  Will hold warfarin Wed, April 5th through Monday, April 10th.  Will restart warfarin per instructions on Tuesday April 11th.   Patient will take lovenox 80mg  q 12 hours starting the evening of Thursday, April 6th through the morning of April 9th.  She will restart Lovenox along with warfarin on Tuesday, April 11th after colonoscopy.   INR check recommended in our office for Thursday, April 13th.  Patient has appt to discuss 07/06/2015

## 2015-06-27 ENCOUNTER — Other Ambulatory Visit: Payer: Self-pay

## 2015-06-27 MED ORDER — PEG 3350-KCL-NA BICARB-NACL 420 G PO SOLR: 4000.0000 mL | Freq: Once | ORAL | Status: DC

## 2015-06-27 NOTE — Telephone Encounter (Signed)
Will Dr. Antony Contras order Lovenox/

## 2015-06-27 NOTE — Telephone Encounter (Signed)
Instructions typed and mailed to pt. Called x 2 no answer and no voicemail set up.

## 2015-06-27 NOTE — Telephone Encounter (Signed)
Please see patient's instructions per [facility]. Patient has an appointment to discuss with them on 07/06/15. You can copy and paste these instructions into her EGD/colonoscopy instructions and proceed with scheduling.

## 2015-07-04 ENCOUNTER — Telehealth: Payer: Self-pay

## 2015-07-04 NOTE — Telephone Encounter (Signed)
No PA is needed for TCS ref# [id number]

## 2015-07-06 ENCOUNTER — Ambulatory Visit (INDEPENDENT_AMBULATORY_CARE_PROVIDER_SITE_OTHER): Payer: Medicare Other | Admitting: Pharmacist

## 2015-07-06 DIAGNOSIS — Z7901 Long term (current) use of anticoagulants: Secondary | ICD-10-CM

## 2015-07-06 DIAGNOSIS — I480 Paroxysmal atrial fibrillation: Secondary | ICD-10-CM | POA: Diagnosis not present

## 2015-07-06 LAB — COAGUCHEK XS/INR WAIVED
INR: 2.4 — ABNORMAL HIGH (ref 0.9–1.1)
Prothrombin Time: 28.4 s

## 2015-07-06 MED ORDER — ENOXAPARIN SODIUM 80 MG/0.8ML ~~LOC~~ SOLN: 80.0000 mg | Freq: Two times a day (BID) | SUBCUTANEOUS | Status: DC

## 2015-07-06 NOTE — Patient Instructions (Signed)
Anticoagulation Dose Instructions as of 07/06/2015      Rhonda Garcia Tue Wed Thu Fri Sat   New Dose 5 mg 7.5 mg 5 mg 5 mg 5 mg 5 mg 5 mg    Description        Continue current warfarin dose to 5mg  - take 1 tablet daily except take 1 and 1/2 tablets on mondays. Follow plan given to stop warfarin 07/13/2015 prior to procedure 07/18/2015.  Will start enoxaparin / Lovenox 80 on 07/14/2015.      INR was 2.4 today

## 2015-07-06 NOTE — Telephone Encounter (Signed)
Called pt - line busy.

## 2015-07-06 NOTE — Telephone Encounter (Signed)
Looks like RX sent by Dr. Antony Contras. Please contact the patient and make sure she has everything she needs regarding medications and instructions.

## 2015-07-07 NOTE — Telephone Encounter (Signed)
Pt is aware of all appointments and medications   PRe-op is 04/06 @ 1245pm

## 2015-07-12 NOTE — Progress Notes (Signed)
HPI The patient presents for evaluation of known coronary disease and atrial fibrillation. She has a history of stenting as described below. Her last catheterization was 2010. Stress perfusion study in 2012.  It has been a couple of years since she was last seen. She's been having increasing shortness of breath with activities such as walking a short distance to her mailbox. She's had increased leg swelling with a 10 pound weight gain over a month or so. She's been having some sporadic chest discomfort but this is not a big complaint. She's not having any chest pressure, neck or arm discomfort. She's not having any palpitations, presyncope or syncope. She is not describing PND or orthopnea.  Allergies  Allergen Reactions  . Promethazine Hcl Anaphylaxis  . Penicillins Other (See Comments)    Pt. Not sure if she is allergic.  Marland Kitchen Promethazine Hcl Other (See Comments)    Current Outpatient Prescriptions  Medication Sig Dispense Refill  . ALPRAZolam (XANAX) 0.5 MG tablet Take 1 tablet (0.5 mg total) by mouth at bedtime as needed for anxiety. 60 tablet 3  . amLODipine (NORVASC) 10 MG tablet Take 1 tablet (10 mg total) by mouth daily. 90 tablet 3  . cyclobenzaprine (FLEXERIL) 5 MG tablet Take 1 tablet (5 mg total) by mouth 3 (three) times daily as needed for muscle spasms. 30 tablet 0  . HYDROcodone-acetaminophen (NORCO/VICODIN) 5-325 MG tablet Take 1 tablet by mouth every 6 (six) hours as needed for moderate pain. 30 tablet 0  . isosorbide mononitrate (IMDUR) 30 MG 24 hr tablet Take 1 tablet (30 mg total) by mouth daily. 90 tablet 3  . levothyroxine (LEVOTHROID) 25 MCG tablet Take 1 tablet (25 mcg total) by mouth daily before breakfast. 90 tablet 1  . Linaclotide (LINZESS) 145 MCG CAPS capsule Take 1 capsule (145 mcg total) by mouth daily. 30 capsule 5  . lisinopril-hydrochlorothiazide (PRINZIDE,ZESTORETIC) 20-12.5 MG per tablet Take 2 tablets by mouth daily. 180 tablet 6  . metFORMIN (GLUCOPHAGE)  500 MG tablet TAKE TWO TABLETS BY MOUTH EVERY MORNING 180 tablet 2  . metoprolol (LOPRESSOR) 50 MG tablet TAKE ONE TABLET BY MOUTH TWICE DAILY 60 tablet 5  . naproxen (NAPROSYN) 500 MG tablet Take 1 tablet (500 mg total) by mouth 2 (two) times daily with a meal. 30 tablet 0  . nitroGLYCERIN (NITROSTAT) 0.4 MG SL tablet Place 0.4 mg under the tongue every 5 (five) minutes as needed.      . pravastatin (PRAVACHOL) 80 MG tablet Take 1 tablet (80 mg total) by mouth daily. 90 tablet 3  . ranitidine (ZANTAC) 150 MG tablet TAKE ONE TABLET BY MOUTH TWICE DAILY 60 tablet 3  . sertraline (ZOLOFT) 100 MG tablet Take 1 tablet (100 mg total) by mouth daily. 90 tablet 0  . Vitamin D, Ergocalciferol, (DRISDOL) 50000 UNITS CAPS capsule Take 1 capsule (50,000 Units total) by mouth every 7 (seven) days. 30 capsule 6  . warfarin (COUMADIN) 5 MG tablet TAKE ONE (1) TABLET EACH DAY 30 tablet 1  . enoxaparin (LOVENOX) 80 MG/0.8ML injection Inject 0.8 mLs (80 mg total) into the skin every 12 (twelve) hours. (Patient not taking: Reported on 07/13/2015) 10 Syringe 0  . polyethylene glycol-electrolytes (NULYTELY/GOLYTELY) 420 g solution Take 4,000 mLs by mouth once. 4000 mL 0   No current facility-administered medications for this visit.    Past Medical History  Diagnosis Date  . Diabetes mellitus   . CAD (coronary artery disease)   . GERD (gastroesophageal reflux disease)   .  Osteopenia   . Hypertension   . Hyperlipidemia   . Anal fissure     resolved   . DDD (degenerative disc disease)   . Anemia   . Airway compromise     Inability to tolerate continuous positive airway pressure  . Hiatal hernia   . Dyslipidemia   . Thyroid disease   . Hearing loss   . Generalized headaches   . Sebaceous cyst     on back  . Back pain     with left radiculopathy  . Hx of myocardial infarction   . Depression   . Hx of peptic ulcer   . A-fib ([place])   . Sleep apnea     not using CPAP now, could not tolerate and PCP  aware.    Past Surgical History  Procedure Laterality Date  . Knee surgery Left     arthroscopy  . Appendectomy    . Breast reduction surgery    . Esophagogastroduodenoscopy  06/2007    Small hiatal hernia  . Colonoscopy  06/2007    Friable anal canal and anal papillae. Pancolonic diverticula. Normal terminal ileum. Status post segmental biopsy, descending colon biopsy showed minimal cryptitis. Biopsies felt to be  nonspecific but could be seen with NSAIDs. No features of inflammatory bowel disease. Stool studies were negative.  . Colonoscopy  03/21/2011    RMR: colonic polyps treated as described above, colonic diverticulosis (Tubular adenomas)  . Cholecystectomy  2008 - approximate  . Abdominal hysterectomy  2001 - approximate  . Lipoma excision  03/17/2012    Procedure: EXCISION LIPOMA;  Surgeon: Gwenyth Ober, MD;  Location: [facility];  Service: General;  Laterality: Right;  excision of right lower back lipoma  . Esophagogastroduodenoscopy  03/21/2011    RMR: normal esophagus- status post passage of Maloney dilator. Small hiatal hernia. Trivial antral and bulbar erosions  . Coronary angioplasty with stent placement      4 stents  . Esophagogastroduodenoscopy (egd) with propofol N/A 06/17/2014    RMR: Small hiatal hernia; otherwise normal EGD. Status post passage of a Maloney dilator. No explanation for patients right upper quadrant abdominal pain.   . Esophageal dilation N/A 06/17/2014    Procedure: ESOPHAGEAL DILATION;  Surgeon: Daneil Dolin, MD;  Location: [facility];  Service: Endoscopy;  Laterality: N/A;  Maloney 67 Fr  . Colonoscopy with propofol N/A 06/17/2014    RMR: Colonic diverticulosis. Multiple colonic polyps removed as described above.   . Polypectomy  06/17/2014    Procedure: POLYPECTOMY;  Surgeon: Daneil Dolin, MD;  Location: [facility];  Service: Endoscopy;;    ROS:  As stated in the HPI and negative for all other systems.  PHYSICAL EXAM BP 170/90 mmHg   Pulse 68  Ht 5\' 2"  (1.575 m)  Wt 209 lb (94.802 kg)  BMI 38.22 kg/m2 GENERAL:  Well appearing HEENT:  Pupils equal round and reactive, fundi not visualized, oral mucosa unremarkable, dentures NECK:  No jugular venous distention, waveform within normal limits, carotid upstroke brisk and symmetric, no bruits, no thyromegaly LYMPHATICS:  No cervical, inguinal adenopathy LUNGS:  Clear to auscultation bilaterally BACK:  No CVA tenderness CHEST:  Unremarkable HEART:  PMI not displaced or sustained,S1 and S2 within normal limits, no S3, no S4, no clicks, no rubs, no murmurs ABD:  Flat, positive bowel sounds normal in frequency in pitch, no bruits, no rebound, no guarding, no midline pulsatile mass, no hepatomegaly, no splenomegaly EXT:  2 plus pulses throughout,  mild left greater than right nonpitting lower extremity edema, no cyanosis no clubbing SKIN:  No rashes no nodules NEURO:  Cranial nerves II through XII grossly intact, motor grossly intact throughout [facility]:  Cognitively intact, oriented to person place and time  EKG 06/14/15   Sinus rhythm, rate 57, axis within normal limits, intervals within normal limits, no acute ST-T wave changes.  ASSESSMENT AND PLAN  ATRIAL FIB  [name] has a CHA2DS2 - VASc score of 5 with a risk of stroke of 6.7%  and a HAS - BLED .  She's had no symptomatic paroxysms.  She will continue with warfarin.  CAD, NATIVE VESSEL  Given her symptoms she needs testing with Lexisscan Myoview.  HYPERTENSION, UNSPECIFIED Her blood pressure is elevated today. I'm going to add spironolactone 25 mg daily. She will get a basic metabolic profile today and again in about 7 days.  HYPERLIPIDEMIA Lipids are as listed below. He'll continue on the meds as listed. Lab Results  Component Value Date   CHOL 169 04/21/2015   TRIG 91 04/21/2015   HDL 46 04/21/2015   LDLCALC 105* 04/21/2015    DYSPNEA  I will check a BNP level and echocardiogram.

## 2015-07-13 ENCOUNTER — Other Ambulatory Visit: Payer: Self-pay | Admitting: Cardiology

## 2015-07-13 ENCOUNTER — Ambulatory Visit (INDEPENDENT_AMBULATORY_CARE_PROVIDER_SITE_OTHER): Payer: Medicare Other | Admitting: Cardiology

## 2015-07-13 ENCOUNTER — Encounter: Payer: Self-pay | Admitting: Cardiology

## 2015-07-13 ENCOUNTER — Other Ambulatory Visit: Payer: Medicare Other

## 2015-07-13 VITALS — BP 170/90 | HR 68 | Ht 62.0 in | Wt 209.0 lb

## 2015-07-13 DIAGNOSIS — I2510 Atherosclerotic heart disease of native coronary artery without angina pectoris: Secondary | ICD-10-CM

## 2015-07-13 DIAGNOSIS — R0602 Shortness of breath: Secondary | ICD-10-CM | POA: Diagnosis not present

## 2015-07-13 DIAGNOSIS — Z79899 Other long term (current) drug therapy: Secondary | ICD-10-CM | POA: Diagnosis not present

## 2015-07-13 MED ORDER — SPIRONOLACTONE 25 MG PO TABS: 25.0000 mg | ORAL_TABLET | Freq: Every day | ORAL | Status: DC

## 2015-07-13 NOTE — Patient Instructions (Signed)
[facility]  07/13/2015     @PREFPERIOPPHARMACY @   Your procedure is scheduled on  07/18/2015   Report to [facility] at  615  A.M.  Call this number if you have problems the morning of surgery:  [phone]   Remember:  Do not eat food or drink liquids after midnight.  Take these medicines the morning of surgery with A SIP OF WATER  Xanax, norvasc, flexaril, hydrocodone, imdur, levothyroxine, metoprolol, zantac, zoloft.   Do not wear jewelry, make-up or nail polish.  Do not wear lotions, powders, or perfumes.  You may wear deodorant.  Do not shave 48 hours prior to surgery.  Men may shave face and neck.  Do not bring valuables to the hospital.  [facility] is not responsible for any belongings or valuables.  Contacts, dentures or bridgework may not be worn into surgery.  Leave your suitcase in the car.  After surgery it may be brought to your room.  For patients admitted to the hospital, discharge time will be determined by your treatment team.  Patients discharged the day of surgery will not be allowed to drive home.   Name and phone number of your driver:   family Special instructions:  Follow the diet and prep instructions given to you by Dr Roseanne Kaufman office.  Please read over the following fact sheets that you were given. Coughing and Deep Breathing, Surgical Site Infection Prevention, Anesthesia Post-op Instructions and Care and Recovery After Surgery      Esophagogastroduodenoscopy Esophagogastroduodenoscopy (EGD) is a procedure that is used to examine the lining of the esophagus, stomach, and first part of the small intestine (duodenum). A long, flexible, lighted tube with a camera attached (endoscope) is inserted down the throat to view these organs. This procedure is done to detect problems or abnormalities, such as inflammation, bleeding, ulcers, or growths, in order to treat them. The procedure lasts 5-20 minutes. It is usually an outpatient  procedure, but it may need to be performed in a hospital in emergency cases. LET [facility] CARE PROVIDER KNOW ABOUT:  Any allergies you have.  All medicines you are taking, including vitamins, herbs, eye drops, creams, and over-the-counter medicines.  Previous problems you or members of your family have had with the use of anesthetics.  Any blood disorders you have.  Previous surgeries you have had.  Medical conditions you have. RISKS AND COMPLICATIONS Generally, this is a safe procedure. However, problems can occur and include:  Infection.  Bleeding.  Tearing (perforation) of the esophagus, stomach, or duodenum.  Difficulty breathing or not being able to breathe.  Excessive sweating.  Spasms of the larynx.  Slowed heartbeat.  Low blood pressure. BEFORE THE PROCEDURE  Do not eat or drink anything after midnight on the night before the procedure or as directed by your health care provider.  Do not take your regular medicines before the procedure if your health care provider asks you not to. Ask your health care provider about changing or stopping those medicines.  If you wear dentures, be prepared to remove them before the procedure.  Arrange for someone to drive you home after the procedure. PROCEDURE  A numbing medicine (local anesthetic) may be sprayed in your throat for comfort and to stop you from gagging or coughing.  You will have an IV tube inserted in a vein in your hand or arm. You will receive medicines and fluids through this  tube.  You will be given a medicine to relax you (sedative).  A pain reliever will be given through the IV tube.  A mouth guard may be placed in your mouth to protect your teeth and to keep you from biting on the endoscope.  You will be asked to lie on your left side.  The endoscope will be inserted down your throat and into your esophagus, stomach, and duodenum.  Air will be put through the endoscope to allow your health  care provider to clearly view the lining of your esophagus.  The lining of your esophagus, stomach, and duodenum will be examined. During the exam, your health care provider may:  Remove tissue to be examined under a microscope (biopsy) for inflammation, infection, or other medical problems.  Remove growths.  Remove objects (foreign bodies) that are stuck.  Treat any bleeding with medicines or other devices that stop tissues from bleeding (hot cautery, clipping devices).  Widen (dilate) or stretch narrowed areas of your esophagus and stomach.  The endoscope will be withdrawn. AFTER THE PROCEDURE  You will be taken to a recovery area for observation. Your blood pressure, heart rate, breathing rate, and blood oxygen level will be monitored often until the medicines you were given have worn off.  Do not eat or drink anything until the numbing medicine has worn off and your gag reflex has returned. You may choke.  Your health care provider should be able to discuss his or her findings with you. It will take longer to discuss the test results if any biopsies were taken.   This information is not intended to replace advice given to you by your health care provider. Make sure you discuss any questions you have with your health care provider.   Document Released: 07/27/2004 Document Revised: 04/16/2014 Document Reviewed: 02/27/2012 Elsevier Interactive Patient Education 2016 [employer]. Esophagogastroduodenoscopy, Care After Refer to this sheet in the next few weeks. These instructions provide you with information about caring for yourself after your procedure. Your health care provider may also give you more specific instructions. Your treatment has been planned according to current medical practices, but problems sometimes occur. Call your health care provider if you have any problems or questions after your procedure. WHAT TO EXPECT AFTER THE PROCEDURE After your procedure, it is typical  to feel:  Soreness in your throat.  Pain with swallowing.  Sick to your stomach (nauseous).  Bloated.  Dizzy.  Fatigued. HOME CARE INSTRUCTIONS  Do not eat or drink anything until the numbing medicine (local anesthetic) has worn off and your gag reflex has returned. You will know that the local anesthetic has worn off when you can swallow comfortably.  Do not drive or operate machinery until directed by your health care provider.  Take medicines only as directed by your health care provider. SEEK MEDICAL CARE IF:   You cannot stop coughing.  You are not urinating at all or less than usual. SEEK IMMEDIATE MEDICAL CARE IF:  You have difficulty swallowing.  You cannot eat or drink.  You have worsening throat or chest pain.  You have dizziness or lightheadedness or you faint.  You have nausea or vomiting.  You have chills.  You have a fever.  You have severe abdominal pain.  You have black, tarry, or bloody stools.   This information is not intended to replace advice given to you by your health care provider. Make sure you discuss any questions you have with your health care provider.  Document Released: 03/12/2012 Document Revised: 04/16/2014 Document Reviewed: 03/12/2012 Elsevier Interactive Patient Education 2016 [employer]. Esophageal Dilatation Esophageal dilatation is a procedure to open a blocked or narrowed part of the esophagus. The esophagus is the long tube in your throat that carries food and liquid from your mouth to your stomach. The procedure is also called esophageal dilation.  You may need this procedure if you have a buildup of scar tissue in your esophagus that makes it difficult, painful, or even impossible to swallow. This can be caused by gastroesophageal reflux disease (GERD). In rare cases, people need this procedure because they have cancer of the esophagus or a problem with the way food moves through the esophagus. Sometimes you may need to  have another dilatation to enlarge the opening of the esophagus gradually. LET [facility] CARE PROVIDER KNOW ABOUT:   Any allergies you have.  All medicines you are taking, including vitamins, herbs, eye drops, creams, and over-the-counter medicines.  Previous problems you or members of your family have had with the use of anesthetics.  Any blood disorders you have.  Previous surgeries you have had.  Medical conditions you have.  Any antibiotic medicines you are required to take before dental procedures. RISKS AND COMPLICATIONS Generally, this is a safe procedure. However, problems can occur and include:  Bleeding from a tear in the lining of the esophagus.  A hole (perforation) in the esophagus. BEFORE THE PROCEDURE  Do not eat or drink anything after midnight on the night before the procedure or as directed by your health care provider.  Ask your health care provider about changing or stopping your regular medicines. This is especially important if you are taking diabetes medicines or blood thinners.  Plan to have someone take you home after the procedure. PROCEDURE   You will be given a medicine that makes you relaxed and sleepy (sedative).  A medicine may be sprayed or gargled to numb the back of the throat.  Your health care provider can use various instruments to do an esophageal dilatation. During the procedure, the instrument used will be placed in your mouth and passed down into your esophagus. Options include:  Simple dilators. This instrument is carefully placed in the esophagus to stretch it.  Guided wire bougies. In this method, a flexible tube (endoscope) is used to insert a wire into the esophagus. The dilator is passed over this wire to enlarge the esophagus. Then the wire is removed.  Balloon dilators. An endoscope with a small balloon at the end is passed down into the esophagus. Inflating the balloon gently stretches the esophagus and opens it up. AFTER  THE PROCEDURE  Your blood pressure, heart rate, breathing rate, and blood oxygen level will be monitored often until the medicines you were given have worn off.  Your throat may feel slightly sore and will probably still feel numb. This will improve slowly over time.  You will not be allowed to eat or drink until the throat numbness has resolved.  If this is a same-day procedure, you may be allowed to go home once you have been able to drink, urinate, and sit on the edge of the bed without nausea or dizziness.  If this is a same-day procedure, you should have a friend or family member with you for the next 24 hours after the procedure.   This information is not intended to replace advice given to you by your health care provider. Make sure you discuss any questions you have  with your health care provider.   Document Released: 05/17/2005 Document Revised: 04/16/2014 Document Reviewed: 08/05/2013 Elsevier Interactive Patient Education 2016 [employer]. Colonoscopy A colonoscopy is an exam to look at the entire large intestine (colon). This exam can help find problems such as tumors, polyps, inflammation, and areas of bleeding. The exam takes about 1 hour.  LET [facility] CARE PROVIDER KNOW ABOUT:   Any allergies you have.  All medicines you are taking, including vitamins, herbs, eye drops, creams, and over-the-counter medicines.  Previous problems you or members of your family have had with the use of anesthetics.  Any blood disorders you have.  Previous surgeries you have had.  Medical conditions you have. RISKS AND COMPLICATIONS  Generally, this is a safe procedure. However, as with any procedure, complications can occur. Possible complications include:  Bleeding.  Tearing or rupture of the colon wall.  Reaction to medicines given during the exam.  Infection (rare). BEFORE THE PROCEDURE   Ask your health care provider about changing or stopping your regular  medicines.  You may be prescribed an oral bowel prep. This involves drinking a large amount of medicated liquid, starting the day before your procedure. The liquid will cause you to have multiple loose stools until your stool is almost clear or light green. This cleans out your colon in preparation for the procedure.  Do not eat or drink anything else once you have started the bowel prep, unless your health care provider tells you it is safe to do so.  Arrange for someone to drive you home after the procedure. PROCEDURE   You will be given medicine to help you relax (sedative).  You will lie on your side with your knees bent.  A long, flexible tube with a light and camera on the end (colonoscope) will be inserted through the rectum and into the colon. The camera sends video back to a computer screen as it moves through the colon. The colonoscope also releases carbon dioxide gas to inflate the colon. This helps your health care provider see the area better.  During the exam, your health care provider may take a small tissue sample (biopsy) to be examined under a microscope if any abnormalities are found.  The exam is finished when the entire colon has been viewed. AFTER THE PROCEDURE   Do not drive for 24 hours after the exam.  You may have a small amount of blood in your stool.  You may pass moderate amounts of gas and have mild abdominal cramping or bloating. This is caused by the gas used to inflate your colon during the exam.  Ask when your test results will be ready and how you will get your results. Make sure you get your test results.   This information is not intended to replace advice given to you by your health care provider. Make sure you discuss any questions you have with your health care provider.   Document Released: 03/23/2000 Document Revised: 01/14/2013 Document Reviewed: 12/01/2012 Elsevier Interactive Patient Education 2016 [employer] Inc. Colonoscopy, Care  After Refer to this sheet in the next few weeks. These instructions provide you with information on caring for yourself after your procedure. Your health care provider may also give you more specific instructions. Your treatment has been planned according to current medical practices, but problems sometimes occur. Call your health care provider if you have any problems or questions after your procedure. WHAT TO EXPECT AFTER THE PROCEDURE  After your procedure, it is typical  to have the following:  A small amount of blood in your stool.  Moderate amounts of gas and mild abdominal cramping or bloating. HOME CARE INSTRUCTIONS  Do not drive, operate machinery, or sign important documents for 24 hours.  You may shower and resume your regular physical activities, but move at a slower pace for the first 24 hours.  Take frequent rest periods for the first 24 hours.  Walk around or put a warm pack on your abdomen to help reduce abdominal cramping and bloating.  Drink enough fluids to keep your urine clear or pale yellow.  You may resume your normal diet as instructed by your health care provider. Avoid heavy or fried foods that are hard to digest.  Avoid drinking alcohol for 24 hours or as instructed by your health care provider.  Only take over-the-counter or prescription medicines as directed by your health care provider.  If a tissue sample (biopsy) was taken during your procedure:  Do not take aspirin or blood thinners for 7 days, or as instructed by your health care provider.  Do not drink alcohol for 7 days, or as instructed by your health care provider.  Eat soft foods for the first 24 hours. SEEK MEDICAL CARE IF: You have persistent spotting of blood in your stool 2-3 days after the procedure. SEEK IMMEDIATE MEDICAL CARE IF:  You have more than a small spotting of blood in your stool.  You pass large blood clots in your stool.  Your abdomen is swollen (distended).  You have  nausea or vomiting.  You have a fever.  You have increasing abdominal pain that is not relieved with medicine.   This information is not intended to replace advice given to you by your health care provider. Make sure you discuss any questions you have with your health care provider.   Document Released: 11/08/2003 Document Revised: 01/14/2013 Document Reviewed: 12/01/2012 Elsevier Interactive Patient Education 2016 Else[employer] Inc. PATIENT INSTRUCTIONS POST-ANESTHESIA  IMMEDIATELY FOLLOWING SURGERY:  Do not drive or operate machinery for the first twenty four hours after surgery.  Do not make any important decisions for twenty four hours after surgery or while taking narcotic pain medications or sedatives.  If you develop intractable nausea and vomiting or a severe headache please notify your doctor immediately.  FOLLOW-UP:  Please make an appointment with your surgeon as instructed. You do not need to follow up with anesthesia unless specifically instructed to do so.  WOUND CARE INSTRUCTIONS (if applicable):  Keep a dry clean dressing on the anesthesia/puncture wound site if there is drainage.  Once the wound has quit draining you may leave it open to air.  Generally you should leave the bandage intact for twenty four hours unless there is drainage.  If the epidural site drains for more than 36-48 hours please call the anesthesia department.  QUESTIONS?:  Please feel free to call your physician or the hospital operator if you have any questions, and they will be happy to assist you.

## 2015-07-13 NOTE — Patient Instructions (Signed)
Medication Instructions:  Please start Spironolactone 25 mg a day. Continue all other medications as listed.  Labwork: Please have blood work today and in 1 week. (BNP and BMP) at [facility].  Testing/Procedures: Your physician has requested that you have an echocardiogram. Echocardiography is a painless test that uses sound waves to create images of your heart. It provides your doctor with information about the size and shape of your heart and how well your heart's chambers and valves are working. This procedure takes approximately one hour. There are no restrictions for this procedure.  Your physician has requested that you have a lexiscan myoview. For further information please visit [email or web address]. Please follow instruction sheet, as given.  Follow-Up: Follow up as scheduled after testing.  If you need a refill on your cardiac medications before your next appointment, please call your pharmacy.  Thank you for choosing [facility]!!      Thank you for choosing [facility]!!

## 2015-07-14 ENCOUNTER — Telehealth: Payer: Self-pay

## 2015-07-14 ENCOUNTER — Encounter (HOSPITAL_COMMUNITY)
Admission: RE | Admit: 2015-07-14 | Discharge: 2015-07-14 | Disposition: A | Discharge status: Home / Self Care | Payer: Medicare Other | Source: Ambulatory Visit | Attending: Internal Medicine | Admitting: Internal Medicine

## 2015-07-14 LAB — BASIC METABOLIC PANEL
BUN/Creatinine Ratio: 19 (ref 12–28)
BUN: 14 mg/dL (ref 8–27)
CO2: 24 mmol/L (ref 18–29)
Calcium: 8.9 mg/dL (ref 8.7–10.3)
Chloride: 102 mmol/L (ref 96–106)
Creatinine, Ser: 0.73 mg/dL (ref 0.57–1.00)
GFR calc Af Amer: 94 mL/min/{1.73_m2} (ref >59)
GFR calc non Af Amer: 81 mL/min/{1.73_m2} (ref >59)
Glucose: 106 mg/dL — ABNORMAL HIGH (ref 65–99)
Potassium: 4.1 mmol/L (ref 3.5–5.2)
Sodium: 141 mmol/L (ref 134–144)

## 2015-07-14 NOTE — Telephone Encounter (Signed)
Spoke with patients son Shanon Brow. States that pt is not coming for procedure on 04/10 because her PCP advised against the procedure right now. Pt is going to have a stress test and other test right now. Son states pt will call when everything is settled.

## 2015-07-18 ENCOUNTER — Encounter (HOSPITAL_COMMUNITY): Admission: RE | Payer: Self-pay | Source: Ambulatory Visit

## 2015-07-18 ENCOUNTER — Ambulatory Visit (HOSPITAL_COMMUNITY): Admission: RE | Admit: 2015-07-18 | Payer: Medicare Other | Source: Ambulatory Visit | Admitting: Internal Medicine

## 2015-07-18 ENCOUNTER — Telehealth: Payer: Self-pay | Admitting: Internal Medicine

## 2015-07-18 SURGERY — COLONOSCOPY WITH PROPOFOL
Anesthesia: Monitor Anesthesia Care

## 2015-07-18 MED ORDER — LINACLOTIDE 145 MCG PO CAPS: 145.0000 ug | ORAL_CAPSULE | Freq: Every day | ORAL | Status: DC

## 2015-07-18 NOTE — Telephone Encounter (Signed)
Routing to the refill box. 

## 2015-07-18 NOTE — Telephone Encounter (Signed)
Completed.

## 2015-07-18 NOTE — Addendum Note (Signed)
Addended by: Orvil Feil on: 07/18/2015 05:14 PM   Modules accepted: Orders

## 2015-07-18 NOTE — Telephone Encounter (Signed)
Pt called to say that the linzess samples were working for her and she needed a prescription called into [place] [facility]

## 2015-07-21 ENCOUNTER — Encounter: Payer: Self-pay | Admitting: Family

## 2015-07-21 ENCOUNTER — Ambulatory Visit (INDEPENDENT_AMBULATORY_CARE_PROVIDER_SITE_OTHER): Payer: Medicare Other | Admitting: Family

## 2015-07-21 VITALS — BP 117/63 | HR 64 | Temp 97.7°F | Ht 62.0 in | Wt 202.0 lb

## 2015-07-21 DIAGNOSIS — Z7901 Long term (current) use of anticoagulants: Secondary | ICD-10-CM | POA: Diagnosis not present

## 2015-07-21 DIAGNOSIS — I480 Paroxysmal atrial fibrillation: Secondary | ICD-10-CM | POA: Diagnosis not present

## 2015-07-21 DIAGNOSIS — K5900 Constipation, unspecified: Secondary | ICD-10-CM | POA: Diagnosis not present

## 2015-07-21 DIAGNOSIS — E119 Type 2 diabetes mellitus without complications: Secondary | ICD-10-CM | POA: Diagnosis not present

## 2015-07-21 LAB — COAGUCHEK XS/INR WAIVED
INR: 2.9 — ABNORMAL HIGH (ref 0.9–1.1)
Prothrombin Time: 35.3 s

## 2015-07-21 NOTE — Progress Notes (Signed)
Subjective:    Patient ID: Rhonda Garcia, female    DOB: [birth year], 75 y.o.   MRN: [id number]  HPI Pt presents to the office today to have INR rechecked. PT was suppose to have a colonoscopy on 06/17/15 and was suppose to stop her warfarin and go on Lovenox for 5 days. PT reports her cardiologists did not clear her and has postponed her colonoscopy until she gets an ECHO. Pt denies any hematuria or blood in her stools. Pt takes her warfarin for A Fib that is stable.   Pt reports feeling constipation. PT states she is taking linzess for the last two weeks, but states she continues to feel constipated and lower abd pain. PT states she has a BM every day, but does not feel "cleaned out". PT states she has taken suppositories that help.    Pt states she has not had her diabetic eye exam in the last. States her DM is well controlled at this time.   Review of Systems  Constitutional: Negative.   HENT: Negative.   Eyes: Negative.   Respiratory: Negative.  Negative for shortness of breath.   Cardiovascular: Negative.  Negative for palpitations.  Gastrointestinal: Negative.   Endocrine: Negative.   Genitourinary: Negative.   Musculoskeletal: Negative.   Neurological: Negative.  Negative for headaches.  Hematological: Negative.   Psychiatric/Behavioral: Negative.   All other systems reviewed and are negative.      Objective:   Physical Exam  Constitutional: She is oriented to person, place, and time. She appears well-developed and well-nourished. No distress.  HENT:  Head: Normocephalic and atraumatic.  Right Ear: External ear normal.  Left Ear: External ear normal.  Nose: Nose normal.  Mouth/Throat: Oropharynx is clear and moist.  Eyes: Pupils are equal, round, and reactive to light.  Neck: Normal range of motion. Neck supple. No thyromegaly present.  Cardiovascular: Normal rate, regular rhythm, normal heart sounds and intact distal pulses.   No murmur heard. Pulmonary/Chest:  Effort normal and breath sounds normal. No respiratory distress. She has no wheezes.  Abdominal: Soft. Bowel sounds are normal. She exhibits no distension. There is no tenderness.  Musculoskeletal: Normal range of motion. She exhibits no edema or tenderness.  Neurological: She is alert and oriented to person, place, and time.  Skin: Skin is warm and dry.  Psychiatric: She has a normal mood and affect. Her behavior is normal. Judgment and thought content normal.  Vitals reviewed.     BP 117/63 mmHg  Pulse 64  Temp(Src) 97.7 F (36.5 C) (Oral)  Ht 5\' 2"  (1.575 m)  Wt 202 lb (91.627 kg)  BMI 36.94 kg/m2     Assessment & Plan:  1. Paroxysmal atrial fibrillation (HCC) - Anticoagulation Dose Instructions as of 07/21/2015      Dorene Grebe Tue Wed Thu Fri Sat   New Dose 5 mg 7.5 mg 5 mg 5 mg 5 mg 5 mg 5 mg    Description        Continue current warfarin dose to 5mg  - take 1 tablet daily except take 1 and 1/2 tablets on mondays.    -Stable -Keep appts with [name] - CoaguChek XS/INR Waived  2. Long term current use of anticoagulant therapy - CoaguChek XS/INR Waived  3. Constipation, unspecified constipation type -Continue Linzess -Force fluids   4. Type 2 diabetes mellitus without complication, without long-term current use of insulin (HCC) -Low carb diet - Ambulatory referral to Ophthalmology   Keep all appts with Cardiologists and when  they clear patient she can get coloscopy   Evelina Dun, FNP

## 2015-07-21 NOTE — Patient Instructions (Addendum)
Anticoagulation Dose Instructions as of 07/21/2015      Rhonda Garcia Tue Wed Thu Fri Sat   New Dose 5 mg 7.5 mg 5 mg 5 mg 5 mg 5 mg 5 mg    Description        Continue current warfarin dose to 5mg  - take 1 tablet daily except take 1 and 1/2 tablets on mondays.     Constipation, Adult Constipation is when a person has fewer than three bowel movements a week, has difficulty having a bowel movement, or has stools that are dry, hard, or larger than normal. As people grow older, constipation is more common. A low-fiber diet, not taking in enough fluids, and taking certain medicines may make constipation worse.  CAUSES   Certain medicines, such as antidepressants, pain medicine, iron supplements, antacids, and water pills.   Certain diseases, such as diabetes, irritable bowel syndrome (IBS), thyroid disease, or depression.   Not drinking enough water.   Not eating enough fiber-rich foods.   Stress or travel.   Lack of physical activity or exercise.   Ignoring the urge to have a bowel movement.   Using laxatives too much.  SIGNS AND SYMPTOMS   Having fewer than three bowel movements a week.   Straining to have a bowel movement.   Having stools that are hard, dry, or larger than normal.   Feeling full or bloated.   Pain in the lower abdomen.   Not feeling relief after having a bowel movement.  DIAGNOSIS  Your health care provider will take a medical history and perform a physical exam. Further testing may be done for severe constipation. Some tests may include:  A barium enema X-ray to examine your rectum, colon, and, sometimes, your small intestine.   A sigmoidoscopy to examine your lower colon.   A colonoscopy to examine your entire colon. TREATMENT  Treatment will depend on the severity of your constipation and what is causing it. Some dietary treatments include drinking more fluids and eating more fiber-rich foods. Lifestyle treatments may include regular  exercise. If these diet and lifestyle recommendations do not help, your health care provider may recommend taking over-the-counter laxative medicines to help you have bowel movements. Prescription medicines may be prescribed if over-the-counter medicines do not work.  HOME CARE INSTRUCTIONS   Eat foods that have a lot of fiber, such as fruits, vegetables, whole grains, and beans.  Limit foods high in fat and processed sugars, such as french fries, hamburgers, cookies, candies, and soda.   A fiber supplement may be added to your diet if you cannot get enough fiber from foods.   Drink enough fluids to keep your urine clear or pale yellow.   Exercise regularly or as directed by your health care provider.   Go to the restroom when you have the urge to go. Do not hold it.   Only take over-the-counter or prescription medicines as directed by your health care provider. Do not take other medicines for constipation without talking to your health care provider first.  [place] IF:   You have bright red blood in your stool.   Your constipation lasts for more than 4 days or gets worse.   You have abdominal or rectal pain.   You have thin, pencil-like stools.   You have unexplained weight loss. MAKE SURE YOU:   Understand these instructions.  Will watch your condition.  Will get help right away if you are not doing well or get worse.  This information is not intended to replace advice given to you by your health care provider. Make sure you discuss any questions you have with your health care provider.   Document Released: 12/23/2003 Document Revised: 04/16/2014 Document Reviewed: 01/05/2013 Elsevier Interactive Patient Education [employer].

## 2015-08-01 ENCOUNTER — Telehealth (HOSPITAL_COMMUNITY): Payer: Self-pay | Admitting: *Deleted

## 2015-08-01 ENCOUNTER — Encounter: Payer: Medicare Other | Admitting: *Deleted

## 2015-08-01 DIAGNOSIS — Z1231 Encounter for screening mammogram for malignant neoplasm of breast: Secondary | ICD-10-CM | POA: Diagnosis not present

## 2015-08-01 LAB — HM MAMMOGRAPHY

## 2015-08-01 NOTE — Telephone Encounter (Signed)
Patient given detailed instructions per Myocardial Perfusion Study Information Sheet for the test on 08/03/15 at 1000. Patient notified to arrive 15 minutes early and that it is imperative to arrive on time for appointment to keep from having the test rescheduled.  If you need to cancel or reschedule your appointment, please call the office within 24 hours of your appointment. Failure to do so may result in a cancellation of your appointment, and a $50 no show fee. Patient verbalized understanding.Alfio Loescher, Ranae [place]

## 2015-08-03 ENCOUNTER — Ambulatory Visit (HOSPITAL_BASED_OUTPATIENT_CLINIC_OR_DEPARTMENT_OTHER): Payer: Medicare Other

## 2015-08-03 ENCOUNTER — Other Ambulatory Visit: Payer: Self-pay

## 2015-08-03 DIAGNOSIS — G4733 Obstructive sleep apnea (adult) (pediatric): Secondary | ICD-10-CM | POA: Insufficient documentation

## 2015-08-03 DIAGNOSIS — R0602 Shortness of breath: Secondary | ICD-10-CM | POA: Diagnosis not present

## 2015-08-03 DIAGNOSIS — E785 Hyperlipidemia, unspecified: Secondary | ICD-10-CM

## 2015-08-03 DIAGNOSIS — Z8249 Family history of ischemic heart disease and other diseases of the circulatory system: Secondary | ICD-10-CM | POA: Insufficient documentation

## 2015-08-03 DIAGNOSIS — I119 Hypertensive heart disease without heart failure: Secondary | ICD-10-CM

## 2015-08-03 DIAGNOSIS — R9439 Abnormal result of other cardiovascular function study: Secondary | ICD-10-CM | POA: Insufficient documentation

## 2015-08-03 DIAGNOSIS — Z87891 Personal history of nicotine dependence: Secondary | ICD-10-CM | POA: Insufficient documentation

## 2015-08-03 DIAGNOSIS — E119 Type 2 diabetes mellitus without complications: Secondary | ICD-10-CM

## 2015-08-03 DIAGNOSIS — I2510 Atherosclerotic heart disease of native coronary artery without angina pectoris: Secondary | ICD-10-CM | POA: Diagnosis not present

## 2015-08-03 DIAGNOSIS — I25110 Atherosclerotic heart disease of native coronary artery with unstable angina pectoris: Secondary | ICD-10-CM | POA: Diagnosis not present

## 2015-08-03 DIAGNOSIS — E039 Hypothyroidism, unspecified: Secondary | ICD-10-CM | POA: Diagnosis not present

## 2015-08-03 DIAGNOSIS — I480 Paroxysmal atrial fibrillation: Secondary | ICD-10-CM | POA: Diagnosis not present

## 2015-08-03 DIAGNOSIS — Z888 Allergy status to other drugs, medicaments and biological substances status: Secondary | ICD-10-CM | POA: Diagnosis not present

## 2015-08-03 DIAGNOSIS — I214 Non-ST elevation (NSTEMI) myocardial infarction: Secondary | ICD-10-CM | POA: Diagnosis not present

## 2015-08-03 DIAGNOSIS — T82855A Stenosis of coronary artery stent, initial encounter: Secondary | ICD-10-CM | POA: Diagnosis not present

## 2015-08-03 DIAGNOSIS — Z7901 Long term (current) use of anticoagulants: Secondary | ICD-10-CM | POA: Diagnosis not present

## 2015-08-03 DIAGNOSIS — I10 Essential (primary) hypertension: Secondary | ICD-10-CM | POA: Diagnosis not present

## 2015-08-03 LAB — MYOCARDIAL PERFUSION IMAGING
LV dias vol: 89 mL (ref 46–106)
LV sys vol: 35 mL
Peak HR: 88 {beats}/min
RATE: 0.3
Rest HR: 57 {beats}/min
SDS: 7
SRS: 6
SSS: 11
TID: 1.05

## 2015-08-03 MED ORDER — TECHNETIUM TC 99M SESTAMIBI GENERIC - CARDIOLITE
32.4000 | Freq: Once | INTRAVENOUS | Status: AC | PRN
  Administered 2015-08-03: 32 via INTRAVENOUS

## 2015-08-03 MED ORDER — REGADENOSON 0.4 MG/5ML IV SOLN
0.4000 mg | Freq: Once | INTRAVENOUS | Status: AC
  Administered 2015-08-03: 0.4 mg via INTRAVENOUS

## 2015-08-03 MED ORDER — TECHNETIUM TC 99M SESTAMIBI GENERIC - CARDIOLITE
10.3000 | Freq: Once | INTRAVENOUS | Status: AC | PRN
  Administered 2015-08-03: 10 via INTRAVENOUS

## 2015-08-05 ENCOUNTER — Inpatient Hospital Stay (HOSPITAL_COMMUNITY)
Admission: EM | Admit: 2015-08-05 | Discharge: 2015-08-08 | DRG: 281 | Disposition: A | Discharge status: Home / Self Care | Payer: Medicare Other | Attending: Cardiology | Admitting: Cardiology

## 2015-08-05 ENCOUNTER — Encounter (HOSPITAL_COMMUNITY): Payer: Self-pay | Admitting: Emergency Medicine

## 2015-08-05 ENCOUNTER — Emergency Department (HOSPITAL_COMMUNITY): Payer: Medicare Other

## 2015-08-05 ENCOUNTER — Other Ambulatory Visit: Payer: Self-pay

## 2015-08-05 DIAGNOSIS — Z8249 Family history of ischemic heart disease and other diseases of the circulatory system: Secondary | ICD-10-CM | POA: Diagnosis not present

## 2015-08-05 DIAGNOSIS — Z888 Allergy status to other drugs, medicaments and biological substances status: Secondary | ICD-10-CM

## 2015-08-05 DIAGNOSIS — E1169 Type 2 diabetes mellitus with other specified complication: Secondary | ICD-10-CM | POA: Diagnosis present

## 2015-08-05 DIAGNOSIS — E039 Hypothyroidism, unspecified: Secondary | ICD-10-CM | POA: Diagnosis present

## 2015-08-05 DIAGNOSIS — I480 Paroxysmal atrial fibrillation: Secondary | ICD-10-CM | POA: Diagnosis present

## 2015-08-05 DIAGNOSIS — F329 Major depressive disorder, single episode, unspecified: Secondary | ICD-10-CM | POA: Diagnosis present

## 2015-08-05 DIAGNOSIS — Z823 Family history of stroke: Secondary | ICD-10-CM

## 2015-08-05 DIAGNOSIS — I25110 Atherosclerotic heart disease of native coronary artery with unstable angina pectoris: Secondary | ICD-10-CM | POA: Insufficient documentation

## 2015-08-05 DIAGNOSIS — Z8711 Personal history of peptic ulcer disease: Secondary | ICD-10-CM

## 2015-08-05 DIAGNOSIS — E785 Hyperlipidemia, unspecified: Secondary | ICD-10-CM | POA: Diagnosis present

## 2015-08-05 DIAGNOSIS — E119 Type 2 diabetes mellitus without complications: Secondary | ICD-10-CM | POA: Diagnosis not present

## 2015-08-05 DIAGNOSIS — Z7982 Long term (current) use of aspirin: Secondary | ICD-10-CM

## 2015-08-05 DIAGNOSIS — I10 Essential (primary) hypertension: Secondary | ICD-10-CM | POA: Diagnosis present

## 2015-08-05 DIAGNOSIS — R002 Palpitations: Secondary | ICD-10-CM

## 2015-08-05 DIAGNOSIS — T82855A Stenosis of coronary artery stent, initial encounter: Principal | ICD-10-CM | POA: Diagnosis present

## 2015-08-05 DIAGNOSIS — Z7984 Long term (current) use of oral hypoglycemic drugs: Secondary | ICD-10-CM

## 2015-08-05 DIAGNOSIS — K219 Gastro-esophageal reflux disease without esophagitis: Secondary | ICD-10-CM | POA: Diagnosis present

## 2015-08-05 DIAGNOSIS — R079 Chest pain, unspecified: Secondary | ICD-10-CM

## 2015-08-05 DIAGNOSIS — Z833 Family history of diabetes mellitus: Secondary | ICD-10-CM

## 2015-08-05 DIAGNOSIS — Y838 Other surgical procedures as the cause of abnormal reaction of the patient, or of later complication, without mention of misadventure at the time of the procedure: Secondary | ICD-10-CM | POA: Diagnosis present

## 2015-08-05 DIAGNOSIS — H9190 Unspecified hearing loss, unspecified ear: Secondary | ICD-10-CM | POA: Diagnosis present

## 2015-08-05 DIAGNOSIS — Z794 Long term (current) use of insulin: Secondary | ICD-10-CM

## 2015-08-05 DIAGNOSIS — I214 Non-ST elevation (NSTEMI) myocardial infarction: Secondary | ICD-10-CM | POA: Diagnosis present

## 2015-08-05 DIAGNOSIS — Z79899 Other long term (current) drug therapy: Secondary | ICD-10-CM

## 2015-08-05 DIAGNOSIS — Z87891 Personal history of nicotine dependence: Secondary | ICD-10-CM | POA: Diagnosis not present

## 2015-08-05 DIAGNOSIS — Z7901 Long term (current) use of anticoagulants: Secondary | ICD-10-CM | POA: Diagnosis not present

## 2015-08-05 DIAGNOSIS — E1159 Type 2 diabetes mellitus with other circulatory complications: Secondary | ICD-10-CM | POA: Diagnosis present

## 2015-08-05 DIAGNOSIS — R0602 Shortness of breath: Secondary | ICD-10-CM | POA: Diagnosis not present

## 2015-08-05 DIAGNOSIS — I152 Hypertension secondary to endocrine disorders: Secondary | ICD-10-CM | POA: Diagnosis present

## 2015-08-05 DIAGNOSIS — R0789 Other chest pain: Secondary | ICD-10-CM | POA: Diagnosis not present

## 2015-08-05 DIAGNOSIS — M8580 Other specified disorders of bone density and structure, unspecified site: Secondary | ICD-10-CM | POA: Diagnosis present

## 2015-08-05 DIAGNOSIS — I2510 Atherosclerotic heart disease of native coronary artery without angina pectoris: Secondary | ICD-10-CM | POA: Diagnosis present

## 2015-08-05 DIAGNOSIS — Z808 Family history of malignant neoplasm of other organs or systems: Secondary | ICD-10-CM

## 2015-08-05 DIAGNOSIS — E1165 Type 2 diabetes mellitus with hyperglycemia: Secondary | ICD-10-CM

## 2015-08-05 HISTORY — DX: Non-ST elevation (NSTEMI) myocardial infarction: I21.4

## 2015-08-05 LAB — BASIC METABOLIC PANEL
Anion gap: 8 (ref 5–15)
BUN: 14 mg/dL (ref 6–20)
CO2: 26 mmol/L (ref 22–32)
Calcium: 9.2 mg/dL (ref 8.9–10.3)
Chloride: 109 mmol/L (ref 101–111)
Creatinine, Ser: 0.88 mg/dL (ref 0.44–1.00)
GFR calc Af Amer: >60 mL/min (ref >60)
GFR calc non Af Amer: >60 mL/min (ref >60)
Glucose, Bld: 169 mg/dL — ABNORMAL HIGH (ref 65–99)
Potassium: 4.3 mmol/L (ref 3.5–5.1)
Sodium: 143 mmol/L (ref 135–145)

## 2015-08-05 LAB — HEPATIC FUNCTION PANEL
ALT: 19 U/L (ref 14–54)
AST: 19 U/L (ref 15–41)
Albumin: 3.3 g/dL — ABNORMAL LOW (ref 3.5–5.0)
Alkaline Phosphatase: 64 U/L (ref 38–126)
Bilirubin, Direct: 0.1 mg/dL (ref 0.1–0.5)
Indirect Bilirubin: 0.8 mg/dL (ref 0.3–0.9)
Total Bilirubin: 0.9 mg/dL (ref 0.3–1.2)
Total Protein: 5.9 g/dL — ABNORMAL LOW (ref 6.5–8.1)

## 2015-08-05 LAB — I-STAT TROPONIN, ED: Troponin i, poc: 0.1 ng/mL (ref 0.00–0.08)

## 2015-08-05 LAB — T4, FREE: Free T4: 0.87 ng/dL (ref 0.61–1.12)

## 2015-08-05 LAB — MAGNESIUM: Magnesium: 1.8 mg/dL (ref 1.7–2.4)

## 2015-08-05 LAB — CBC
HCT: 41.3 % (ref 36.0–46.0)
Hemoglobin: 13.2 g/dL (ref 12.0–15.0)
MCH: 29.5 pg (ref 26.0–34.0)
MCHC: 32 g/dL (ref 30.0–36.0)
MCV: 92.2 fL (ref 78.0–100.0)
Platelets: 183 10*3/uL (ref 150–400)
RBC: 4.48 MIL/uL (ref 3.87–5.11)
RDW: 14.4 % (ref 11.5–15.5)
WBC: 6.9 10*3/uL (ref 4.0–10.5)

## 2015-08-05 LAB — TROPONIN I
Troponin I: 0.14 ng/mL — ABNORMAL HIGH (ref <0.031)
Troponin I: 0.13 ng/mL — ABNORMAL HIGH (ref <0.031)

## 2015-08-05 LAB — PROTIME-INR
INR: 1.75 — ABNORMAL HIGH (ref 0.00–1.49)
INR: 1.81 — ABNORMAL HIGH (ref 0.00–1.49)
Prothrombin Time: 20.4 seconds — ABNORMAL HIGH (ref 11.6–15.2)
Prothrombin Time: 20.9 seconds — ABNORMAL HIGH (ref 11.6–15.2)

## 2015-08-05 LAB — GLUCOSE, CAPILLARY: Glucose-Capillary: 221 mg/dL — ABNORMAL HIGH (ref 65–99)

## 2015-08-05 LAB — TSH: TSH: 1.555 u[IU]/mL (ref 0.350–4.500)

## 2015-08-05 LAB — MRSA PCR SCREENING: MRSA by PCR: NEGATIVE

## 2015-08-05 MED ORDER — ASPIRIN EC 81 MG PO TBEC
81.0000 mg | DELAYED_RELEASE_TABLET | Freq: Every day | ORAL | Status: DC
  Administered 2015-08-06 – 2015-08-08 (×3): 81 mg via ORAL
  Filled 2015-08-05 (×3): qty 1

## 2015-08-05 MED ORDER — INSULIN ASPART 100 UNIT/ML ~~LOC~~ SOLN
0.0000 [IU] | Freq: Three times a day (TID) | SUBCUTANEOUS | Status: DC
  Administered 2015-08-06 (×2): 2 [IU] via SUBCUTANEOUS
  Administered 2015-08-07: 1 [IU] via SUBCUTANEOUS
  Administered 2015-08-07 (×2): 3 [IU] via SUBCUTANEOUS
  Administered 2015-08-08 (×2): 1 [IU] via SUBCUTANEOUS

## 2015-08-05 MED ORDER — PRAVASTATIN SODIUM 40 MG PO TABS
80.0000 mg | ORAL_TABLET | Freq: Every day | ORAL | Status: DC
Start: 2015-08-05 — End: 2015-08-06
  Administered 2015-08-05: 80 mg via ORAL
  Filled 2015-08-05: qty 2

## 2015-08-05 MED ORDER — SODIUM CHLORIDE 0.9 % IV SOLN
INTRAVENOUS | Status: DC
  Administered 2015-08-05: 21:00:00 via INTRAVENOUS

## 2015-08-05 MED ORDER — AMLODIPINE BESYLATE 10 MG PO TABS
10.0000 mg | ORAL_TABLET | Freq: Every day | ORAL | Status: DC
  Administered 2015-08-06 – 2015-08-08 (×3): 10 mg via ORAL
  Filled 2015-08-05 (×3): qty 1

## 2015-08-05 MED ORDER — NITROGLYCERIN 0.4 MG SL SUBL
0.4000 mg | SUBLINGUAL_TABLET | SUBLINGUAL | Status: DC | PRN
  Filled 2015-08-05: qty 1

## 2015-08-05 MED ORDER — METOPROLOL TARTRATE 50 MG PO TABS
50.0000 mg | ORAL_TABLET | Freq: Two times a day (BID) | ORAL | Status: DC
  Administered 2015-08-05 – 2015-08-08 (×6): 50 mg via ORAL
  Filled 2015-08-05 (×6): qty 1

## 2015-08-05 MED ORDER — NITROGLYCERIN 0.4 MG SL SUBL: 0.4000 mg | SUBLINGUAL_TABLET | SUBLINGUAL | Status: DC | PRN

## 2015-08-05 MED ORDER — INSULIN ASPART 100 UNIT/ML ~~LOC~~ SOLN
0.0000 [IU] | Freq: Every day | SUBCUTANEOUS | Status: DC
  Administered 2015-08-05: 2 [IU] via SUBCUTANEOUS

## 2015-08-05 MED ORDER — ALPRAZOLAM 0.5 MG PO TABS: 0.5000 mg | ORAL_TABLET | Freq: Every evening | ORAL | Status: DC | PRN

## 2015-08-05 MED ORDER — ASPIRIN 81 MG PO CHEW
324.0000 mg | CHEWABLE_TABLET | Freq: Once | ORAL | Status: AC
  Administered 2015-08-05: 324 mg via ORAL
  Filled 2015-08-05: qty 4

## 2015-08-05 MED ORDER — ZOLPIDEM TARTRATE 5 MG PO TABS: 5.0000 mg | ORAL_TABLET | Freq: Every evening | ORAL | Status: DC | PRN

## 2015-08-05 MED ORDER — SERTRALINE HCL 100 MG PO TABS
100.0000 mg | ORAL_TABLET | Freq: Every day | ORAL | Status: DC
  Administered 2015-08-05 – 2015-08-08 (×4): 100 mg via ORAL
  Filled 2015-08-05 (×4): qty 1

## 2015-08-05 MED ORDER — LINACLOTIDE 145 MCG PO CAPS
145.0000 ug | ORAL_CAPSULE | Freq: Every day | ORAL | Status: DC
  Administered 2015-08-06 – 2015-08-07 (×2): 145 ug via ORAL
  Filled 2015-08-05 (×3): qty 1

## 2015-08-05 MED ORDER — ASPIRIN 300 MG RE SUPP: 300.0000 mg | RECTAL | Status: DC

## 2015-08-05 MED ORDER — ACETAMINOPHEN 325 MG PO TABS
650.0000 mg | ORAL_TABLET | ORAL | Status: DC | PRN
  Administered 2015-08-07: 650 mg via ORAL
  Filled 2015-08-05: qty 2

## 2015-08-05 MED ORDER — SPIRONOLACTONE 25 MG PO TABS
25.0000 mg | ORAL_TABLET | Freq: Every day | ORAL | Status: DC
  Administered 2015-08-06 – 2015-08-07 (×2): 25 mg via ORAL
  Filled 2015-08-05 (×3): qty 1

## 2015-08-05 MED ORDER — ONDANSETRON HCL 4 MG/2ML IJ SOLN: 4.0000 mg | Freq: Four times a day (QID) | INTRAMUSCULAR | Status: DC | PRN

## 2015-08-05 MED ORDER — ASPIRIN 81 MG PO CHEW: 324.0000 mg | CHEWABLE_TABLET | ORAL | Status: DC

## 2015-08-05 MED ORDER — LEVOTHYROXINE SODIUM 25 MCG PO TABS
25.0000 ug | ORAL_TABLET | Freq: Every day | ORAL | Status: DC
  Administered 2015-08-06 – 2015-08-08 (×3): 25 ug via ORAL
  Filled 2015-08-05 (×3): qty 1

## 2015-08-05 MED ORDER — NITROGLYCERIN 2 % TD OINT
1.0000 [in_us] | TOPICAL_OINTMENT | Freq: Four times a day (QID) | TRANSDERMAL | Status: DC
  Administered 2015-08-06 (×2): 1 [in_us] via TOPICAL
  Filled 2015-08-05: qty 30

## 2015-08-05 MED ORDER — CYCLOBENZAPRINE HCL 10 MG PO TABS: 5.0000 mg | ORAL_TABLET | Freq: Three times a day (TID) | ORAL | Status: DC | PRN

## 2015-08-05 MED ORDER — NITROGLYCERIN 0.4 MG SL SUBL
0.4000 mg | SUBLINGUAL_TABLET | SUBLINGUAL | Status: DC | PRN
Start: 2015-08-05 — End: 2015-08-08

## 2015-08-05 MED ORDER — HEPARIN (PORCINE) IN NACL 100-0.45 UNIT/ML-% IJ SOLN
1150.0000 [IU]/h | INTRAMUSCULAR | Status: DC
  Administered 2015-08-05: 1000 [IU]/h via INTRAVENOUS
  Administered 2015-08-06 – 2015-08-07 (×2): 1150 [IU]/h via INTRAVENOUS
  Filled 2015-08-05 (×3): qty 250

## 2015-08-05 NOTE — H&P (Signed)
Rhonda Garcia is an 75 y.o. female.    Primary Cardiologist:Dr. Percival Spanish PCP:  Evelina Dun, FNP  Chief Complaint: chest pain and PAF  HPI:  75 yo female with hx of CAD with 4 stents to LAD, ramus branch LCX and RCA and PAF. Last cath 2010 and previous nuc study 2012.  She was seen 07/13/15 with increasing DOE, increased swelling, and sporadic chest discomfort.    She had been maintaining SR with a  CHA2DS2 - VASc score of 5 on warfarin.   LAST CATH 2010: 1. Coronary artery disease status post prior PCI with 0% stenosis of  the stent site in the proximal LAD, 70% narrowing in the first  diagonal branch of the LAD, tandem 70% stenosis in the mid LAD, 40%  stenosis at the stent site in the ramus branch of circumflex  artery, less than 10% stenosis at the stent site in the proximal  right coronary artery and 95% stenosis in the mid-right coronary  artery with normal LV function. 2. Successful PCI of the mid-right coronary artery lesion using a Zion  drug-eluting stent with improvement in center narrowing from 95% to  0%.  Nuc study was done 08/03/15 and EF was 60% prior MI with peri-infarct ischemia.  Echo EF 60-65%, G2DD, lt atrium mod. To severely dilated.    Troponin 0.10, second one 0.14.   EKG SR no acute changes.  Lytes WNL Hgb 13.2  INR 1.75   Last pm she had rapid HR and chest pressure .  + nausea  And diaphoretic.  She was concerned so came to ER.    Past Medical History  Diagnosis Date  . Diabetes mellitus   . CAD (coronary artery disease)   . GERD (gastroesophageal reflux disease)   . Osteopenia   . Hypertension   . Hyperlipidemia   . Anal fissure     resolved   . DDD (degenerative disc disease)   . Anemia   . Airway compromise     Inability to tolerate continuous positive airway pressure  . Hiatal hernia   . Dyslipidemia   . Thyroid disease   . Hearing loss   . Generalized headaches   . Sebaceous cyst     on back   . Back pain     with left radiculopathy  . Hx of myocardial infarction   . Depression   . Hx of peptic ulcer   . A-fib ([place])   . Sleep apnea     not using CPAP now, could not tolerate and PCP aware.  . NSTEMI (non-ST elevated myocardial infarction) ([place]) 08/05/2015    Past Surgical History  Procedure Laterality Date  . Knee surgery Left     arthroscopy  . Appendectomy    . Breast reduction surgery    . Esophagogastroduodenoscopy  06/2007    Small hiatal hernia  . Colonoscopy  06/2007    Friable anal canal and anal papillae. Pancolonic diverticula. Normal terminal ileum. Status post segmental biopsy, descending colon biopsy showed minimal cryptitis. Biopsies felt to be  nonspecific but could be seen with NSAIDs. No features of inflammatory bowel disease. Stool studies were negative.  . Colonoscopy  03/21/2011    RMR: colonic polyps treated as described above, colonic diverticulosis (Tubular adenomas)  . Cholecystectomy  2008 - approximate  . Abdominal hysterectomy  2001 - approximate  . Lipoma excision  03/17/2012    Procedure: EXCISION LIPOMA;  Surgeon: Gwenyth Ober, MD;  Location: [facility];  Service: General;  Laterality: Right;  excision of right lower back lipoma  . Esophagogastroduodenoscopy  03/21/2011    RMR: normal esophagus- status post passage of Maloney dilator. Small hiatal hernia. Trivial antral and bulbar erosions  . Coronary angioplasty with stent placement      4 stents  . Esophagogastroduodenoscopy (egd) with propofol N/A 06/17/2014    RMR: Small hiatal hernia; otherwise normal EGD. Status post passage of a Maloney dilator. No explanation for patients right upper quadrant abdominal pain.   . Esophageal dilation N/A 06/17/2014    Procedure: ESOPHAGEAL DILATION;  Surgeon: Daneil Dolin, MD;  Location: [facility];  Service: Endoscopy;  Laterality: N/A;  Maloney 84 Fr  . Colonoscopy with propofol N/A 06/17/2014    RMR: Colonic diverticulosis. Multiple colonic  polyps removed as described above.   . Polypectomy  06/17/2014    Procedure: POLYPECTOMY;  Surgeon: Daneil Dolin, MD;  Location: [facility];  Service: Endoscopy;;    Family History  Problem Relation Age of Onset  . Heart attack Mother   . Hypertension Mother   . Stroke Mother     Deceased, age 54  . Diabetes Sister   . Diabetes Sister   . Cancer Sister     ovarian  . Stroke Sister   . Diabetes Brother     also has a blood disorder   . Clotting disorder Brother   . Melanoma Brother     deceased  . Coronary artery disease Other   . Kidney disease Other   . Colon cancer Neg Hx    Social History:  reports that she quit smoking about 34 years ago. Her smoking use included Cigarettes. She has a 2.5 pack-year smoking history. She has never used smokeless tobacco. She reports that she does not drink alcohol or use illicit drugs.  Allergies:  Allergies  Allergen Reactions  . Promethazine Hcl Anaphylaxis    OUTPATIENT MEDICATIONS: No current facility-administered medications on file prior to encounter.   Current Outpatient Prescriptions on File Prior to Encounter  Medication Sig Dispense Refill  . ALPRAZolam (XANAX) 0.5 MG tablet Take 1 tablet (0.5 mg total) by mouth at bedtime as needed for anxiety. 60 tablet 3  . amLODipine (NORVASC) 10 MG tablet Take 1 tablet (10 mg total) by mouth daily. 90 tablet 3  . cyclobenzaprine (FLEXERIL) 5 MG tablet Take 1 tablet (5 mg total) by mouth 3 (three) times daily as needed for muscle spasms. (Patient taking differently: Take 5 mg by mouth at bedtime. ) 30 tablet 0  . isosorbide mononitrate (IMDUR) 30 MG 24 hr tablet Take 1 tablet (30 mg total) by mouth daily. 90 tablet 3  . levothyroxine (LEVOTHROID) 25 MCG tablet Take 1 tablet (25 mcg total) by mouth daily before breakfast. 90 tablet 1  . Linaclotide (LINZESS) 145 MCG CAPS capsule Take 1 capsule (145 mcg total) by mouth daily. 90 capsule 3  . lisinopril-hydrochlorothiazide (PRINZIDE,ZESTORETIC)  20-12.5 MG per tablet Take 2 tablets by mouth daily. 180 tablet 6  . metFORMIN (GLUCOPHAGE) 500 MG tablet TAKE TWO TABLETS BY MOUTH EVERY MORNING 180 tablet 2  . metoprolol (LOPRESSOR) 50 MG tablet TAKE ONE TABLET BY MOUTH TWICE DAILY 60 tablet 5  . naproxen (NAPROSYN) 500 MG tablet Take 1 tablet (500 mg total) by mouth 2 (two) times daily with a meal. (Patient taking differently: Take 500 mg by mouth daily as needed for mild pain. ) 30 tablet 0  . nitroGLYCERIN (NITROSTAT)  0.4 MG SL tablet Place 0.4 mg under the tongue every 5 (five) minutes as needed for chest pain.     . pravastatin (PRAVACHOL) 80 MG tablet Take 1 tablet (80 mg total) by mouth daily. 90 tablet 3  . ranitidine (ZANTAC) 150 MG tablet TAKE ONE TABLET BY MOUTH TWICE DAILY 60 tablet 3  . sertraline (ZOLOFT) 100 MG tablet Take 1 tablet (100 mg total) by mouth daily. 90 tablet 0  . spironolactone (ALDACTONE) 25 MG tablet Take 1 tablet (25 mg total) by mouth daily. 30 tablet 6  . warfarin (COUMADIN) 5 MG tablet TAKE ONE (1) TABLET EACH DAY (Patient taking differently: TAKE ONE (1) TABLET EACH evening) 30 tablet 1     Results for orders placed or performed during the hospital encounter of 08/05/15 (from the past 48 hour(s))  Basic metabolic panel     Status: Abnormal   Collection Time: 08/05/15  1:03 PM  Result Value Ref Range   Sodium 143 135 - 145 mmol/L   Potassium 4.3 3.5 - 5.1 mmol/L   Chloride 109 101 - 111 mmol/L   CO2 26 22 - 32 mmol/L   Glucose, Bld 169 (H) 65 - 99 mg/dL   BUN 14 6 - 20 mg/dL   Creatinine, Ser 0.88 0.44 - 1.00 mg/dL   Calcium 9.2 8.9 - 10.3 mg/dL   GFR calc non Af Amer >60 >60 mL/min   GFR calc Af Amer >60 >60 mL/min    Comment: (NOTE) The eGFR has been calculated using the CKD EPI equation. This calculation has not been validated in all clinical situations. eGFR's persistently <60 mL/min signify possible Chronic Kidney Disease.    Anion gap 8 5 - 15  CBC     Status: None   Collection Time:  08/05/15  1:03 PM  Result Value Ref Range   WBC 6.9 4.0 - 10.5 K/uL   RBC 4.48 3.87 - 5.11 MIL/uL   Hemoglobin 13.2 12.0 - 15.0 g/dL   HCT 41.3 36.0 - 46.0 %   MCV 92.2 78.0 - 100.0 fL   MCH 29.5 26.0 - 34.0 pg   MCHC 32.0 30.0 - 36.0 g/dL   RDW 14.4 11.5 - 15.5 %   Platelets 183 150 - 400 K/uL  Protime-INR - (order if Patient is taking Coumadin / Warfarin)     Status: Abnormal   Collection Time: 08/05/15  1:03 PM  Result Value Ref Range   Prothrombin Time 20.4 (H) 11.6 - 15.2 seconds   INR 1.75 (H) 0.00 - 1.49  I-Stat Troponin, ED (not at [facility])     Status: Abnormal   Collection Time: 08/05/15  4:04 PM  Result Value Ref Range   Troponin i, poc 0.10 (HH) 0.00 - 0.08 ng/mL   Comment NOTIFIED PHYSICIAN    Comment 3            Comment: Due to the release kinetics of cTnI, a negative result within the first hours of the onset of symptoms does not rule out myocardial infarction with certainty. If myocardial infarction is still suspected, repeat the test at appropriate intervals.   Troponin I     Status: Abnormal   Collection Time: 08/05/15  4:56 PM  Result Value Ref Range   Troponin I 0.14 (H) <0.031 ng/mL    Comment:        PERSISTENTLY INCREASED TROPONIN VALUES IN THE RANGE OF 0.04-0.49 ng/mL CAN BE SEEN IN:       -UNSTABLE ANGINA       -  CONGESTIVE HEART FAILURE       -MYOCARDITIS       -CHEST TRAUMA       -ARRYHTHMIAS       -LATE PRESENTING MYOCARDIAL INFARCTION       -COPD   CLINICAL FOLLOW-UP RECOMMENDED.    Dg Chest 2 View  08/05/2015  CLINICAL DATA:  Shortness of breath with exertion for the last few days. Chest pain last night. Headache, nausea, back and arm pain this morning. History of cardiac stent placement. EXAM: CHEST  2 VIEW COMPARISON:  Chest x-ray dated 01/12/2015. FINDINGS: Heart size is upper normal, stable. Mild atelectasis versus scarring at the left lung base. Lungs otherwise clear. No pleural effusion or pneumothorax seen. Stable elevation the right  hemidiaphragm. Mild degenerative change within the thoracic spine. IMPRESSION: Probable mild atelectasis or scarring at the left lung base. Lungs are otherwise clear with no other evidence of acute cardiopulmonary abnormality. Electronically Signed   By: Franki Cabot M.D.   On: 08/05/2015 13:58    ROS: General:no colds or fevers, no weight changes Skin:no rashes or ulcers HEENT:no blurred vision, no congestion CV:see HPI PUL:see HPI  + sleep apnea GI:no diarrhea constipation or melena, no indigestion GU:no hematuria, no dysuria MS:no joint pain, no claudication Neuro:no syncope, no lightheadedness Endo:+ diabetes, + thyroid disease   Blood pressure 116/86, pulse 58, temperature 97.8 F (36.6 C), temperature source Oral, resp. rate 21, height _0  (1.575 m), weight 200 lb (90.719 kg), SpO2 94 %. PE: PER Dr. Ellyn Hack General:Pleasant affect, NAD Skin:Warm and dry, brisk capillary refill HEENT:normocephalic, sclera clear, mucus membranes moist Neck:supple, no JVD, no bruits  Heart:S1S2 RRR without murmur, gallup, rub or click Lungs:clear with few rales, no rhonchi, or wheezes OIZ:TIWP, non tender, + BS, do not palpate liver spleen or masses Ext:no lower ext edema, 2+ pedal pulses, 2+ radial pulses Neuro:alert and oriented X 3, MAE, follows commands, + facial symmetry, grips and push pull of feet equal bil.  Assessment/Plan Principal Problem:   NSTEMI (non-ST elevated myocardial infarction) ([place]) Active Problems:   CAD, NATIVE VESSEL   Hyperlipidemia   Essential hypertension   Diabetes mellitus type 2, uncomplicated (HCC)   Paroxysmal atrial fibrillation ([place])   Long term current use of anticoagulant therapy  Plan IV heparin with decreased INR and NSTEMI.  Serial troponin, plan cardiac cath on Monday with Dr. Angelena Form Continue statin and check lipids, stop imdur and add NTG paste unless recurrent chest pain then change to IV NTG.conntinue BB.  Dr. Ellyn Hack has seen      [facility] R Nurse Practitioner Certified [place] Pager [phone] or after 5pm or weekends call [phone] 08/05/2015, 6:03 PM  I personally saw and evaluated the patient in the emergency room along with Cecilie Kicks, NP-C. I personally performed the physical examination. We have discussed the findings and recommendations as noted above. Basically 75 year old woman with known history of CAD and PAF who has had aggressively worsening exertional dyspnea evaluated with Myoview that was read as "intermediate risk "with possible anterior ischemia. She now presented following a prolonged episode last night where she felt like she was may be in atrial fibrillation with significant dyspnea, orthopnea and chest tightness/pressure consistent with unstable (rest) angina. Her initial troponin is slightly elevated, but EKG is totally normal.  She is currently notably improved from this morning. Her husband indicates that she was very pale and diaphoretic appearing this morning and looks 90% better. She is still notes a little nagging  sensation in her chest, but nothing like she had last night. She also does not feel any A. Fib.  Principal Problem:   NSTEMI (non-ST elevated myocardial infarction) [facility]) -- admitted for early invasive strategy.  CAD, NATIVE VESSEL -- known CAD with prior PCI. She is artery on aspirin, statin, beta blocker and ACE inhibitor.  Stop Coumadin. Start IV heparin  Stop Imdur, use nitro paste or nitroglycerin drip  Lisinopril without HCTZ as we will give Lasix along with her spironolactone while she is here for mild PND symptoms and dyspnea  Continue beta blocker and statin  She is currently scheduled for cardiac catheterization with coronary angiography and possible PCI on Monday morning. However if she has more recurrent symptoms over the weekend, we may be inclined to proceed to the cath lab sooner.  Active Problems:   Hyperlipidemia - on  pravastatin   Essential hypertension - relatively well-controlled. Will continue home medications but would lisinopril and no HCTZ as we are using Lasix.      Diabetes mellitus type 2, uncomplicated ([place]) -- she is on insulin at home. We will use Lantus with sliding scale insulin   Paroxysmal atrial fibrillation (HCC) - currently in normal sinus rhythm. Holding warfarin. If stent is recommended, would consider Synergy Stent or inclusion in LEADERS FREE Trial.    Long term current use of anticoagulant therapy - hold warfarin, start IV heparin    Autumn Pruitt, Leonie Green, M.D., M.S. Interventional Cardiologist   Pager # [phone] Phone # [phone] [address] [place], [place] [address]

## 2015-08-05 NOTE — ED Provider Notes (Signed)
CSN: [id number]     Arrival date & time 08/05/15  1253 History   None    Chief Complaint  Patient presents with  . Chest Pain     (Consider location/radiation/quality/duration/timing/severity/associated sxs/prior Treatment) HPI 75 y.o. female with a hx of CAD s/p stenting x4, and paroxysmal atrial fibrillation presents to the emergency department stating that on 4/26 she had a chemical stress test which precipitated significant chest pain nausea and shortness of breath. The study was ordered by Dr. Percival Spanish, with whom she follows as an outpatient. The patient states that she did not hear any definitive results of this study yet. Patient states that last night while at rest around 10PM she had an episode of palpitations similar to prior with rapid irregular heartbeat. This was associated with significant pressure-like chest pain in her central chest radiating to her left arm and associated with nausea diaphoresis and mild lightheadedness. The patient took no nitroglycerin for this but was able to improve her symptoms with deep breathing and rest. Her symptoms seem to wax and wane for a few minutes with intermittent palpitations. The patient again had palpitations with associated chest pain this morning which concerned her and brought her into the emergency department for further evaluation. On arrival the patient denies any current chest pain. Additionally the patient states that she has had significantly worsening exertional shortness of breath last several months however has not had associated chest pain until yesterday. On review of her chart the results of her chemical stress test reviewed and showed evidence of ischemia surrounding her prior infarct. She reportedly has ben compliant with her regular medications.  Past Medical History  Diagnosis Date  . Diabetes mellitus   . CAD (coronary artery disease)   . GERD (gastroesophageal reflux disease)   . Osteopenia   . Hypertension   .  Hyperlipidemia   . Anal fissure     resolved   . DDD (degenerative disc disease)   . Anemia   . Airway compromise     Inability to tolerate continuous positive airway pressure  . Hiatal hernia   . Dyslipidemia   . Thyroid disease   . Hearing loss   . Generalized headaches   . Sebaceous cyst     on back  . Back pain     with left radiculopathy  . Hx of myocardial infarction   . Depression   . Hx of peptic ulcer   . A-fib ([place])   . Sleep apnea     not using CPAP now, could not tolerate and PCP aware.   Past Surgical History  Procedure Laterality Date  . Knee surgery Left     arthroscopy  . Appendectomy    . Breast reduction surgery    . Esophagogastroduodenoscopy  06/2007    Small hiatal hernia  . Colonoscopy  06/2007    Friable anal canal and anal papillae. Pancolonic diverticula. Normal terminal ileum. Status post segmental biopsy, descending colon biopsy showed minimal cryptitis. Biopsies felt to be  nonspecific but could be seen with NSAIDs. No features of inflammatory bowel disease. Stool studies were negative.  . Colonoscopy  03/21/2011    RMR: colonic polyps treated as described above, colonic diverticulosis (Tubular adenomas)  . Cholecystectomy  2008 - approximate  . Abdominal hysterectomy  2001 - approximate  . Lipoma excision  03/17/2012    Procedure: EXCISION LIPOMA;  Surgeon: Gwenyth Ober, MD;  Location: [place];  Service: General;  Laterality: Right;  excision of  right lower back lipoma  . Esophagogastroduodenoscopy  03/21/2011    RMR: normal esophagus- status post passage of Maloney dilator. Small hiatal hernia. Trivial antral and bulbar erosions  . Coronary angioplasty with stent placement      4 stents  . Esophagogastroduodenoscopy (egd) with propofol N/A 06/17/2014    RMR: Small hiatal hernia; otherwise normal EGD. Status post passage of a Maloney dilator. No explanation for patients right upper quadrant abdominal pain.   . Esophageal  dilation N/A 06/17/2014    Procedure: ESOPHAGEAL DILATION;  Surgeon: Daneil Dolin, MD;  Location: [facility];  Service: Endoscopy;  Laterality: N/A;  Maloney 92 Fr  . Colonoscopy with propofol N/A 06/17/2014    RMR: Colonic diverticulosis. Multiple colonic polyps removed as described above.   . Polypectomy  06/17/2014    Procedure: POLYPECTOMY;  Surgeon: Daneil Dolin, MD;  Location: [facility];  Service: Endoscopy;;   Family History  Problem Relation Age of Onset  . Heart attack Mother   . Hypertension Mother   . Stroke Mother     Deceased, age 60  . Diabetes Sister   . Diabetes Sister   . Cancer Sister     ovarian  . Stroke Sister   . Diabetes Brother     also has a blood disorder   . Clotting disorder Brother   . Melanoma Brother     deceased  . Coronary artery disease Other   . Kidney disease Other   . Colon cancer Neg Hx    Social History  Substance Use Topics  . Smoking status: Former Smoker -- 0.50 packs/day for 5 years    Types: Cigarettes    Quit date: 03/13/1981  . Smokeless tobacco: Never Used     Comment: quit 30 +years ago  . Alcohol Use: No   OB History    No data available     Review of Systems  Constitutional: Positive for diaphoresis and activity change. Negative for fever and chills.  HENT: Negative for congestion, hearing loss, rhinorrhea and sinus pressure.   Respiratory: Positive for chest tightness and shortness of breath. Negative for cough and wheezing.   Cardiovascular: Positive for chest pain and palpitations. Negative for leg swelling.  Gastrointestinal: Positive for nausea. Negative for vomiting, abdominal pain, diarrhea and blood in stool.  Genitourinary: Negative for dysuria and difficulty urinating.  Musculoskeletal: Positive for back pain (radiates to her left shoulder blade). Negative for neck pain.  Skin: Negative for rash and wound.  Neurological: Positive for light-headedness. Negative for dizziness, syncope, weakness, numbness and  headaches.  All other systems reviewed and are negative.     Allergies  Promethazine hcl  Home Medications   Prior to Admission medications   Medication Sig Start Date End Date Taking? Authorizing Provider  ALPRAZolam Duanne Moron) 0.5 MG tablet Take 1 tablet (0.5 mg total) by mouth at bedtime as needed for anxiety. 09/09/14  Yes Sharion Balloon, FNP  amLODipine (NORVASC) 10 MG tablet Take 1 tablet (10 mg total) by mouth daily. 02/07/15  Yes Sharion Balloon, FNP  cyclobenzaprine (FLEXERIL) 5 MG tablet Take 1 tablet (5 mg total) by mouth 3 (three) times daily as needed for muscle spasms. Patient taking differently: Take 5 mg by mouth at bedtime.  06/14/15  Yes Sharion Balloon, FNP  isosorbide mononitrate (IMDUR) 30 MG 24 hr tablet Take 1 tablet (30 mg total) by mouth daily. 03/21/15  Yes Sharion Balloon, FNP  levothyroxine (LEVOTHROID) 25 MCG tablet Take 1  tablet (25 mcg total) by mouth daily before breakfast. 01/17/15  Yes Sharion Balloon, FNP  Linaclotide (LINZESS) 145 MCG CAPS capsule Take 1 capsule (145 mcg total) by mouth daily. 07/18/15  Yes Orvil Feil, NP  lisinopril-hydrochlorothiazide (PRINZIDE,ZESTORETIC) 20-12.5 MG per tablet Take 2 tablets by mouth daily. 10/14/13  Yes Sharion Balloon, FNP  metFORMIN (GLUCOPHAGE) 500 MG tablet TAKE TWO TABLETS BY MOUTH EVERY MORNING 05/03/15  Yes Sharion Balloon, FNP  metoprolol (LOPRESSOR) 50 MG tablet TAKE ONE TABLET BY MOUTH TWICE DAILY 05/03/15  Yes Sharion Balloon, FNP  naproxen (NAPROSYN) 500 MG tablet Take 1 tablet (500 mg total) by mouth 2 (two) times daily with a meal. Patient taking differently: Take 500 mg by mouth daily as needed for mild pain.  06/14/15  Yes Sharion Balloon, FNP  nitroGLYCERIN (NITROSTAT) 0.4 MG SL tablet Place 0.4 mg under the tongue every 5 (five) minutes as needed for chest pain.    Yes Historical Provider, MD  pravastatin (PRAVACHOL) 80 MG tablet Take 1 tablet (80 mg total) by mouth daily. 01/18/14  Yes Sharion Balloon, FNP   ranitidine (ZANTAC) 150 MG tablet TAKE ONE TABLET BY MOUTH TWICE DAILY 02/11/15  Yes Sharion Balloon, FNP  sertraline (ZOLOFT) 100 MG tablet Take 1 tablet (100 mg total) by mouth daily. 03/21/15  Yes Sharion Balloon, FNP  spironolactone (ALDACTONE) 25 MG tablet Take 1 tablet (25 mg total) by mouth daily. 07/13/15  Yes Minus Breeding, MD  warfarin (COUMADIN) 5 MG tablet TAKE ONE (1) TABLET EACH DAY Patient taking differently: TAKE ONE (1) TABLET EACH evening 06/09/15  Yes Christy A Hawks, FNP   BP 138/58 mmHg  Pulse 59  Temp(Src) 97.8 F (36.6 C) (Oral)  Resp 15  Ht 5\' 2"  (1.575 m)  Wt 90.719 kg  BMI 36.57 kg/m2  SpO2 97% Physical Exam  Constitutional: She is oriented to person, place, and time. She appears well-developed and well-nourished. No distress.  HENT:  Head: Normocephalic and atraumatic.  Nose: Nose normal.  Mouth/Throat: Oropharynx is clear and moist.  Eyes: Conjunctivae and EOM are normal. Pupils are equal, round, and reactive to light.  Neck: Neck supple.  Cardiovascular: Normal rate, regular rhythm, normal heart sounds and intact distal pulses.   Pulmonary/Chest: Effort normal and breath sounds normal. She exhibits no tenderness.  Abdominal: Soft. She exhibits no distension. There is no tenderness.  Musculoskeletal: She exhibits no edema or tenderness.  Neurological: She is alert and oriented to person, place, and time. No cranial nerve deficit. Coordination normal.  Skin: Skin is warm and dry. No rash noted. She is not diaphoretic.  Nursing note and vitals reviewed.   ED Course  Procedures (including critical care time) Labs Review Labs Reviewed  BASIC METABOLIC PANEL - Abnormal; Notable for the following:    Glucose, Bld 169 (*)    All other components within normal limits  PROTIME-INR - Abnormal; Notable for the following:    Prothrombin Time 20.4 (*)    INR 1.75 (*)    All other components within normal limits  I-STAT TROPOININ, ED - Abnormal; Notable for the  following:    Troponin i, poc 0.10 (*)    All other components within normal limits  CBC  TROPONIN I    Imaging Review Dg Chest 2 View  08/05/2015  CLINICAL DATA:  Shortness of breath with exertion for the last few days. Chest pain last night. Headache, nausea, back and arm pain this morning. History  of cardiac stent placement. EXAM: CHEST  2 VIEW COMPARISON:  Chest x-ray dated 01/12/2015. FINDINGS: Heart size is upper normal, stable. Mild atelectasis versus scarring at the left lung base. Lungs otherwise clear. No pleural effusion or pneumothorax seen. Stable elevation the right hemidiaphragm. Mild degenerative change within the thoracic spine. IMPRESSION: Probable mild atelectasis or scarring at the left lung base. Lungs are otherwise clear with no other evidence of acute cardiopulmonary abnormality. Electronically Signed   By: Franki Cabot M.D.   On: 08/05/2015 13:58   I have personally reviewed and evaluated these images and lab results as part of my medical decision-making.   EKG Interpretation   Date/Time:  Friday August 05 2015 12:56:39 EDT Ventricular Rate:  66 PR Interval:  178 QRS Duration: 80 QT Interval:  380 QTC Calculation: 398 R Axis:   29 Text Interpretation:  Normal sinus rhythm Normal ECG No significant change  was found Confirmed by CAMPOS  MD, Lennette Bihari ([id number]) on 08/05/2015 3:10:03 PM      MDM  75 y.o. female with a hx of CAD and Paroxysmal. a. fib presents to the ED noting the onset of precordial chest pain last night and this AM when she had an episode of a. Fib at rest. She had a positive stress test on Wednesday but had not discussed her test results with her physician yet. On exam she is sitting comfortably in bed, AF with VSS. EKG shows no acute ischemic findings.Labs were drawn and were significant for troponin of 0.1 with subtherapeutic INR of 1.75. CXR negative for acute abnormality. Given positive stress test on Wednesday and now with anginal sx with  intermittent palpitations and positive troponin, feel that the patient needs to be admitted to cardiology for further care and assessment and possible cath. Cardiology was consulted and agreed to see the patient at the bedside. She was then admitted to their service for further care.   Final diagnoses:  Chest pain, unspecified chest pain type  Intermittent palpitations       Zenovia Jarred, DO 08/05/15 [place], MD 08/05/15 1728

## 2015-08-05 NOTE — ED Notes (Signed)
Has afib and last night her heart started to beat irreg she has been =nauseated and her arm has hurt left and rt , has been really tirED

## 2015-08-05 NOTE — ED Notes (Signed)
Cardiology MD at bedside.

## 2015-08-05 NOTE — Consult Note (Signed)
ANTICOAGULATION CONSULT NOTE - Initial Consult  Pharmacy Consult for Heparin Indication: NSTEMI  Allergies  Allergen Reactions  . Promethazine Hcl Anaphylaxis    Patient Measurements: Height: 5\' 2"  (157.5 cm) Weight: 198 lb 8 oz (90.039 kg) IBW/kg (Calculated) : 50.1 Heparin Dosing Weight: 70.8kg  Vital Signs: Temp: 98 F (36.7 C) (04/28 1937) Temp Source: Oral (04/28 1937) BP: 155/68 mmHg (04/28 1937) Pulse Rate: 62 (04/28 1937)  Labs:  Recent Labs  08/05/15 1303 08/05/15 1656  HGB 13.2  --   HCT 41.3  --   PLT 183  --   LABPROT 20.4*  --   INR 1.75*  --   CREATININE 0.88  --   TROPONINI  --  0.14*    Estimated Creatinine Clearance: 58.5 mL/min (by C-G formula based on Cr of 0.88).   Medical History: Past Medical History  Diagnosis Date  . Diabetes mellitus   . CAD (coronary artery disease)   . GERD (gastroesophageal reflux disease)   . Osteopenia   . Hypertension   . Hyperlipidemia   . Anal fissure     resolved   . DDD (degenerative disc disease)   . Anemia   . Airway compromise     Inability to tolerate continuous positive airway pressure  . Hiatal hernia   . Dyslipidemia   . Thyroid disease   . Hearing loss   . Generalized headaches   . Sebaceous cyst     on back  . Back pain     with left radiculopathy  . Hx of myocardial infarction   . Depression   . Hx of peptic ulcer   . A-fib ([place])   . Sleep apnea     not using CPAP now, could not tolerate and PCP aware.  . NSTEMI (non-ST elevated myocardial infarction) ([place]) 08/05/2015   Assessment: 75yof on coumadin pta for afib, being admitted with NSTEMI. INR 1.75 on admission - plan to hold coumadin and start IV heparin pending cath on Monday. Baseline CBC wnl.  Goal of Therapy:  Heparin level 0.3-0.7 units/ml Monitor platelets by anticoagulation protocol: Yes   Plan:  1) Begin heparin at 1000 units with no bolus given elevated INR 2) Check 8 hour heparin level 3) Daily heparin level and  CBC  Deboraha Sprang 08/05/2015,7:41 PM

## 2015-08-06 DIAGNOSIS — I10 Essential (primary) hypertension: Secondary | ICD-10-CM

## 2015-08-06 LAB — BASIC METABOLIC PANEL
Anion gap: 9 (ref 5–15)
BUN: 16 mg/dL (ref 6–20)
CO2: 24 mmol/L (ref 22–32)
Calcium: 8.9 mg/dL (ref 8.9–10.3)
Chloride: 109 mmol/L (ref 101–111)
Creatinine, Ser: 0.76 mg/dL (ref 0.44–1.00)
GFR calc Af Amer: >60 mL/min (ref >60)
GFR calc non Af Amer: >60 mL/min (ref >60)
Glucose, Bld: 165 mg/dL — ABNORMAL HIGH (ref 65–99)
Potassium: 4 mmol/L (ref 3.5–5.1)
Sodium: 142 mmol/L (ref 135–145)

## 2015-08-06 LAB — CBC
HCT: 40.8 % (ref 36.0–46.0)
Hemoglobin: 12.8 g/dL (ref 12.0–15.0)
MCH: 29.1 pg (ref 26.0–34.0)
MCHC: 31.4 g/dL (ref 30.0–36.0)
MCV: 92.7 fL (ref 78.0–100.0)
Platelets: 176 10*3/uL (ref 150–400)
RBC: 4.4 MIL/uL (ref 3.87–5.11)
RDW: 14.4 % (ref 11.5–15.5)
WBC: 8.1 10*3/uL (ref 4.0–10.5)

## 2015-08-06 LAB — GLUCOSE, CAPILLARY
Glucose-Capillary: 158 mg/dL — ABNORMAL HIGH (ref 65–99)
Glucose-Capillary: 163 mg/dL — ABNORMAL HIGH (ref 65–99)
Glucose-Capillary: 117 mg/dL — ABNORMAL HIGH (ref 65–99)
Glucose-Capillary: 166 mg/dL — ABNORMAL HIGH (ref 65–99)

## 2015-08-06 LAB — HEPARIN LEVEL (UNFRACTIONATED)
Heparin Unfractionated: 0.23 IU/mL — ABNORMAL LOW (ref 0.30–0.70)
Heparin Unfractionated: 0.31 IU/mL (ref 0.30–0.70)

## 2015-08-06 LAB — LIPID PANEL
Cholesterol: 137 mg/dL (ref 0–200)
HDL: 36 mg/dL — ABNORMAL LOW (ref >40)
LDL Cholesterol: 80 mg/dL (ref 0–99)
Total CHOL/HDL Ratio: 3.8 RATIO
Triglycerides: 105 mg/dL (ref <150)
VLDL: 21 mg/dL (ref 0–40)

## 2015-08-06 LAB — TROPONIN I
Troponin I: 0.09 ng/mL — ABNORMAL HIGH (ref <0.031)
Troponin I: 0.07 ng/mL — ABNORMAL HIGH (ref <0.031)

## 2015-08-06 MED ORDER — FAMOTIDINE 20 MG PO TABS
20.0000 mg | ORAL_TABLET | Freq: Two times a day (BID) | ORAL | Status: DC
  Administered 2015-08-06 – 2015-08-08 (×5): 20 mg via ORAL
  Filled 2015-08-06 (×5): qty 1

## 2015-08-06 MED ORDER — NITROGLYCERIN IN D5W 200-5 MCG/ML-% IV SOLN
0.0000 ug/min | INTRAVENOUS | Status: DC
  Administered 2015-08-06: 5 ug/min via INTRAVENOUS
  Filled 2015-08-06: qty 250

## 2015-08-06 MED ORDER — ATORVASTATIN CALCIUM 80 MG PO TABS
80.0000 mg | ORAL_TABLET | Freq: Every day | ORAL | Status: DC
  Administered 2015-08-06 – 2015-08-08 (×3): 80 mg via ORAL
  Filled 2015-08-06 (×3): qty 1

## 2015-08-06 NOTE — Progress Notes (Signed)
ANTICOAGULATION CONSULT NOTE - Follow Up Consult  Pharmacy Consult for Heparin  Indication: chest pain/ACS  Allergies  Allergen Reactions  . Promethazine Hcl Anaphylaxis   Patient Measurements: Height: 5\' 2"  (157.5 cm) Weight: 199 lb 1.6 oz (90.311 kg) IBW/kg (Calculated) : 50.1  Vital Signs: Temp: 97.7 F (36.5 C) (04/29 0305) Temp Source: Oral (04/29 0305) BP: 138/79 mmHg (04/29 0305) Pulse Rate: 65 (04/29 0600)  Labs:  Recent Labs  08/05/15 1303 08/05/15 1656 08/05/15 2000 08/06/15 0200 08/06/15 0600  HGB 13.2  --   --   --   --   HCT 41.3  --   --   --   --   PLT 183  --   --   --   --   LABPROT 20.4*  --  20.9*  --   --   INR 1.75*  --  1.81*  --   --   HEPARINUNFRC  --   --   --   --  0.23*  CREATININE 0.88  --   --   --   --   TROPONINI  --  0.14* 0.13* 0.09*  --     Estimated Creatinine Clearance: 58.6 mL/min (by C-G formula based on Cr of 0.88).  Assessment: Heparin for NSTEMI, pending cath Monday, initial heparin level is low at 0.23  Goal of Therapy:  Heparin level 0.3-0.7 units/ml Monitor platelets by anticoagulation protocol: Yes   Plan:  -Increase heparin to 1100 units/hr -1400 HL  Narda Bonds 08/06/2015,6:39 AM

## 2015-08-06 NOTE — Plan of Care (Signed)
Problem: Phase I Progression Outcomes Goal: Hemodynamically stable Outcome: Progressing VSS Goal: Anginal pain relieved Outcome: Progressing NTG gtt started. Denies Chest Pain Goal: Aspirin unless contraindicated Outcome: Progressing Tolerates ASA Goal: Voiding-avoid urinary catheter unless indicated Outcome: Progressing OOB to BR to void

## 2015-08-06 NOTE — Progress Notes (Signed)
ANTICOAGULATION CONSULT NOTE - Follow Up Consult  Pharmacy Consult for Heparin  Indication: chest pain/ACS  Allergies  Allergen Reactions  . Promethazine Hcl Anaphylaxis   Patient Measurements: Height: 5\' 2"  (157.5 cm) Weight: 199 lb 1.6 oz (90.311 kg) IBW/kg (Calculated) : 50.1  Heparin dosing weight: 70 kg  Vital Signs: Temp: 97.7 F (36.5 C) (04/29 1246) Temp Source: Oral (04/29 1246) BP: 152/73 mmHg (04/29 1246) Pulse Rate: 64 (04/29 1246)  Labs:  Recent Labs  08/05/15 1303  08/05/15 2000 08/06/15 0200 08/06/15 0600 08/06/15 0829 08/06/15 1437  HGB 13.2  --   --   --   --  12.8  --   HCT 41.3  --   --   --   --  40.8  --   PLT 183  --   --   --   --  176  --   LABPROT 20.4*  --  20.9*  --   --   --   --   INR 1.75*  --  1.81*  --   --   --   --   HEPARINUNFRC  --   --   --   --  0.23*  --  0.31  CREATININE 0.88  --   --   --   --  0.76  --   TROPONINI  --   < > 0.13* 0.09*  --  0.07*  --   < > = values in this interval not displayed.  Estimated Creatinine Clearance: 64.5 mL/min (by C-G formula based on Cr of 0.76).  Assessment: Heparin for NSTEMI, pending cath Monday.  Heparin level 0.31 - therapeutic but at low end of goal on 1100 units/hr.   Goal of Therapy:  Heparin level 0.3-0.7 units/ml Monitor platelets by anticoagulation protocol: Yes   Plan:  -Increase heparin to 1150 units/hr to keep in goal -Daily HL and CBC  Sloan Leiter, PharmD, BCPS Clinical Pharmacist [phone]  08/06/2015,3:28 PM

## 2015-08-06 NOTE — Progress Notes (Signed)
SUBJECTIVE:  Had some mild CP last night but no further CP since then   OBJECTIVE:   Vitals:   Filed Vitals:   08/06/15 0200 08/06/15 0305 08/06/15 0600 08/06/15 0700  BP:  138/79  135/62  Pulse: 66 64 65 67  Temp:  97.7 F (36.5 C)  97.6 F (36.4 C)  TempSrc:  Oral  Oral  Resp: 21 19 20 21   Height:      Weight:  199 lb 1.6 oz (90.311 kg)    SpO2: 88% 93% 92% 93%   I&O's:   Intake/Output Summary (Last 24 hours) at 08/06/15 1119 Last data filed at 08/06/15 0900  Gross per 24 hour  Intake    170 ml  Output      0 ml  Net    170 ml   TELEMETRY: Reviewed telemetry pt in NSR:     PHYSICAL EXAM General: Well developed, well nourished, in no acute distress Head: Eyes PERRLA, No xanthomas.   Normal cephalic and atramatic  Lungs:   Clear bilaterally to auscultation and percussion. Heart:   HRRR S1 S2 Pulses are 2+ & equal. Abdomen: Bowel sounds are positive, abdomen soft and non-tender without masses  Extremities:   No clubbing, cyanosis or edema.  DP +1 Neuro: Alert and oriented X 3. Psych:  Good affect, responds appropriately   LABS: Basic Metabolic Panel:  Recent Labs  08/05/15 1303 08/05/15 2000 08/06/15 0829  NA 143  --  142  K 4.3  --  4.0  CL 109  --  109  CO2 26  --  24  GLUCOSE 169*  --  165*  BUN 14  --  16  CREATININE 0.88  --  0.76  CALCIUM 9.2  --  8.9  MG  --  1.8  --    Liver Function Tests:  Recent Labs  08/05/15 2000  AST 19  ALT 19  ALKPHOS 64  BILITOT 0.9  PROT 5.9*  ALBUMIN 3.3*   No results for input(s): LIPASE, AMYLASE in the last 72 hours. CBC:  Recent Labs  08/05/15 1303 08/06/15 0829  WBC 6.9 8.1  HGB 13.2 12.8  HCT 41.3 40.8  MCV 92.2 92.7  PLT 183 176   Cardiac Enzymes:  Recent Labs  08/05/15 2000 08/06/15 0200 08/06/15 0829  TROPONINI 0.13* 0.09* 0.07*   BNP: Invalid input(s): POCBNP D-Dimer: No results for input(s): DDIMER in the last 72 hours. Hemoglobin A1C: No results for input(s): HGBA1C in  the last 72 hours. Fasting Lipid Panel:  Recent Labs  08/06/15 0200  CHOL 137  HDL 36*  LDLCALC 80  TRIG 105  CHOLHDL 3.8   Thyroid Function Tests:  Recent Labs  08/05/15 2000  TSH 1.555   Anemia Panel: No results for input(s): VITAMINB12, FOLATE, FERRITIN, TIBC, IRON, RETICCTPCT in the last 72 hours. Coag Panel:   Lab Results  Component Value Date   INR 1.81* 08/05/2015   INR 1.75* 08/05/2015   INR 2.9* 07/21/2015    RADIOLOGY: Dg Chest 2 View  08/05/2015  CLINICAL DATA:  Shortness of breath with exertion for the last few days. Chest pain last night. Headache, nausea, back and arm pain this morning. History of cardiac stent placement. EXAM: CHEST  2 VIEW COMPARISON:  Chest x-ray dated 01/12/2015. FINDINGS: Heart size is upper normal, stable. Mild atelectasis versus scarring at the left lung base. Lungs otherwise clear. No pleural effusion or pneumothorax seen. Stable elevation the right hemidiaphragm. Mild degenerative change within  the thoracic spine. IMPRESSION: Probable mild atelectasis or scarring at the left lung base. Lungs are otherwise clear with no other evidence of acute cardiopulmonary abnormality. Electronically Signed   By: Franki Cabot M.D.   On: 08/05/2015 13:58   Assessment/Plan  75 year old woman with known history of CAD and PAF who has had aggressively worsening exertional dyspnea evaluated with Myoview that was read as "intermediate risk "with possible anterior ischemia. She now presented following a prolonged episode  where she felt like she was may be in atrial fibrillation with significant dyspnea, orthopnea and chest tightness/pressure consistent with unstable (rest) angina. Her initial troponin is slightly elevated, but EKG is totally normal.  Principal Problem:  NSTEMI (non-ST elevated myocardial infarction) ([place]) Active Problems:  CAD, NATIVE VESSEL  Hyperlipidemia  Essential hypertension  Diabetes mellitus type 2, uncomplicated (HCC)   Paroxysmal atrial fibrillation ([place])  Long term current use of anticoagulant therapy Principal Problem:  NSTEMI (non-ST elevated myocardial infarction) ([place])  1.  NSTEMI  - She is  on aspirin, statin, beta blocker and ACE inhibitor.  Coumadin stopped and now on IV heparin. Continue BB/statin/ASA.  Change NTPaste to IV NTG due to recurrent CP last night. She is currently scheduled for cardiac catheterization with coronary angiography and possible PCI on Monday morning. However if she has more recurrent symptoms over the weekend, we may be inclined to proceed to the cath lab sooner. 2.  ASCAD prior PCI of the LAD/ramus of LCx/RCA.  She is on aspirin, statin, beta blocker and ACE inhibitor. 3.  HTN -  relatively well-controlled. Will continue home medications with lisinopril and no HCTZ as we are using Lasix for dyspnea.  Continue amlodipine/BB. 3.  Hyperlipidemia - on pravastatin. LDL 80 and goal < 70.  D/C pravastatin and start Lipitor 80mg  daily.  4.  Diabetes mellitus type 2, uncomplicated ([place]) -- she is on insulin at home. We will use Lantus with sliding scale insulin 5.  Paroxysmal atrial fibrillation (HCC) - currently in normal sinus rhythm. Holding warfarin. If stent is recommended, would consider Synergy Stent or inclusion in [place], MD  08/06/2015  11:19 AM

## 2015-08-07 LAB — CBC
HCT: 38.6 % (ref 36.0–46.0)
Hemoglobin: 12.5 g/dL (ref 12.0–15.0)
MCH: 29.6 pg (ref 26.0–34.0)
MCHC: 32.4 g/dL (ref 30.0–36.0)
MCV: 91.3 fL (ref 78.0–100.0)
Platelets: 166 10*3/uL (ref 150–400)
RBC: 4.23 MIL/uL (ref 3.87–5.11)
RDW: 14.2 % (ref 11.5–15.5)
WBC: 7.1 10*3/uL (ref 4.0–10.5)

## 2015-08-07 LAB — BASIC METABOLIC PANEL
Anion gap: 9 (ref 5–15)
BUN: 13 mg/dL (ref 6–20)
CO2: 24 mmol/L (ref 22–32)
Calcium: 9.1 mg/dL (ref 8.9–10.3)
Chloride: 107 mmol/L (ref 101–111)
Creatinine, Ser: 0.82 mg/dL (ref 0.44–1.00)
GFR calc Af Amer: >60 mL/min (ref >60)
GFR calc non Af Amer: >60 mL/min (ref >60)
Glucose, Bld: 193 mg/dL — ABNORMAL HIGH (ref 65–99)
Potassium: 3.8 mmol/L (ref 3.5–5.1)
Sodium: 140 mmol/L (ref 135–145)

## 2015-08-07 LAB — GLUCOSE, CAPILLARY
Glucose-Capillary: 212 mg/dL — ABNORMAL HIGH (ref 65–99)
Glucose-Capillary: 147 mg/dL — ABNORMAL HIGH (ref 65–99)
Glucose-Capillary: 206 mg/dL — ABNORMAL HIGH (ref 65–99)
Glucose-Capillary: 188 mg/dL — ABNORMAL HIGH (ref 65–99)

## 2015-08-07 LAB — HEPARIN LEVEL (UNFRACTIONATED): Heparin Unfractionated: 0.39 IU/mL (ref 0.30–0.70)

## 2015-08-07 NOTE — Progress Notes (Signed)
SUBJECTIVE:  No further CP  OBJECTIVE:   Vitals:   Filed Vitals:   08/06/15 2152 08/07/15 0104 08/07/15 0416 08/07/15 0814  BP: 159/64 148/67 140/68 140/60  Pulse: 69 58 60 73  Temp:  98.4 F (36.9 C) 98.6 F (37 C) 98 F (36.7 C)  TempSrc:  Oral Oral Oral  Resp:  16 19 20   Height:      Weight:  199 lb 11.8 oz (90.6 kg)    SpO2:  95% 95% 96%   I&O's:   Intake/Output Summary (Last 24 hours) at 08/07/15 0949 Last data filed at 08/07/15 [id number]  Gross per 24 hour  Intake 1432.24 ml  Output    800 ml  Net 632.24 ml   TELEMETRY: Reviewed telemetry pt in NSR:     PHYSICAL EXAM General: Well developed, well nourished, in no acute distress Head: Eyes PERRLA, No xanthomas.   Normal cephalic and atramatic  Lungs:   Clear bilaterally to auscultation and percussion. Heart:   HRRR S1 S2 Pulses are 2+ & equal. Abdomen: Bowel sounds are positive, abdomen soft and non-tender without masses Extremities:   No clubbing, cyanosis or edema.  DP +1 Neuro: Alert and oriented X 3. Psych:  Good affect, responds appropriately   LABS: Basic Metabolic Panel:  Recent Labs  08/05/15 1303 08/05/15 2000 08/06/15 0829  NA 143  --  142  K 4.3  --  4.0  CL 109  --  109  CO2 26  --  24  GLUCOSE 169*  --  165*  BUN 14  --  16  CREATININE 0.88  --  0.76  CALCIUM 9.2  --  8.9  MG  --  1.8  --    Liver Function Tests:  Recent Labs  08/05/15 2000  AST 19  ALT 19  ALKPHOS 64  BILITOT 0.9  PROT 5.9*  ALBUMIN 3.3*   No results for input(s): LIPASE, AMYLASE in the last 72 hours. CBC:  Recent Labs  08/06/15 0829 08/07/15 0627  WBC 8.1 7.1  HGB 12.8 12.5  HCT 40.8 38.6  MCV 92.7 91.3  PLT 176 166   Cardiac Enzymes:  Recent Labs  08/05/15 2000 08/06/15 0200 08/06/15 0829  TROPONINI 0.13* 0.09* 0.07*   BNP: Invalid input(s): POCBNP D-Dimer: No results for input(s): DDIMER in the last 72 hours. Hemoglobin A1C: No results for input(s): HGBA1C in the last 72  hours. Fasting Lipid Panel:  Recent Labs  08/06/15 0200  CHOL 137  HDL 36*  LDLCALC 80  TRIG 105  CHOLHDL 3.8   Thyroid Function Tests:  Recent Labs  08/05/15 2000  TSH 1.555   Anemia Panel: No results for input(s): VITAMINB12, FOLATE, FERRITIN, TIBC, IRON, RETICCTPCT in the last 72 hours. Coag Panel:   Lab Results  Component Value Date   INR 1.81* 08/05/2015   INR 1.75* 08/05/2015   INR 2.9* 07/21/2015    RADIOLOGY: Dg Chest 2 View  08/05/2015  CLINICAL DATA:  Shortness of breath with exertion for the last few days. Chest pain last night. Headache, nausea, back and arm pain this morning. History of cardiac stent placement. EXAM: CHEST  2 VIEW COMPARISON:  Chest x-ray dated 01/12/2015. FINDINGS: Heart size is upper normal, stable. Mild atelectasis versus scarring at the left lung base. Lungs otherwise clear. No pleural effusion or pneumothorax seen. Stable elevation the right hemidiaphragm. Mild degenerative change within the thoracic spine. IMPRESSION: Probable mild atelectasis or scarring at the left lung base. Lungs are  otherwise clear with no other evidence of acute cardiopulmonary abnormality. Electronically Signed   By: Franki Cabot M.D.   On: 08/05/2015 13:58    Assessment/Plan 75 year old woman with known history of CAD and PAF who has had aggressively worsening exertional dyspnea evaluated with Myoview that was read as "intermediate risk "with possible anterior ischemia. She now presented following a prolonged episode where she felt like she was may be in atrial fibrillation with significant dyspnea, orthopnea and chest tightness/pressure consistent with unstable (rest) angina. Her initial troponin is slightly elevated, but EKG is totally normal.  Principal Problem:  NSTEMI (non-ST elevated myocardial infarction) ([place]) Active Problems:  CAD, NATIVE VESSEL  Hyperlipidemia  Essential hypertension  Diabetes mellitus type 2, uncomplicated (HCC)  Paroxysmal  atrial fibrillation ([place])  Long term current use of anticoagulant therapy Principal Problem:  NSTEMI (non-ST elevated myocardial infarction) ([place])  1. NSTEMI - She is on aspirin, statin, beta blocker and ACE inhibitor. Coumadin stopped and now on IV heparin. Continue BB/statin/ASA/IV NTG. She is currently scheduled for cardiac catheterization with coronary angiography and possible PCI on Monday morning. However if she has more recurrent symptoms over the weekend, we may be inclined to proceed to the cath lab sooner. 2. ASCAD prior PCI of the LAD/ramus of LCx/RCA. She is on aspirin, statin, beta blocker and ACE inhibitor. 3. HTN - relatively well-controlled. Will continue home medications with lisinopril and no HCTZ as we are using Lasix for dyspnea. Continue amlodipine/BB. 3. Hyperlipidemia - on pravastatin. LDL 80 and goal < 70. Pravastatin d/c'd and started Lipitor 80mg  daily.  4. Diabetes mellitus type 2, uncomplicated ([place]) -- she is on insulin at home. We will use Lantus with sliding scale insulin 5. Paroxysmal atrial fibrillation (HCC) - currently in normal sinus rhythm. Holding warfarin. If stent is recommended, would consider Synergy Stent or inclusion in [place], MD  08/07/2015  9:49 AM

## 2015-08-07 NOTE — Progress Notes (Signed)
ANTICOAGULATION CONSULT NOTE - Follow Up Consult  Pharmacy Consult for Heparin  Indication: chest pain/ACS  Allergies  Allergen Reactions  . Promethazine Hcl Anaphylaxis   Patient Measurements: Height: 5\' 2"  (157.5 cm) Weight: 199 lb 11.8 oz (90.6 kg) IBW/kg (Calculated) : 50.1  Heparin dosing weight: 70 kg  Vital Signs: Temp: 98.6 F (37 C) (04/30 0416) Temp Source: Oral (04/30 0416) BP: 140/68 mmHg (04/30 0416) Pulse Rate: 60 (04/30 0416)  Labs:  Recent Labs  08/05/15 1303  08/05/15 2000 08/06/15 0200 08/06/15 0600 08/06/15 0829 08/06/15 1437 08/07/15 0627  HGB 13.2  --   --   --   --  12.8  --  12.5  HCT 41.3  --   --   --   --  40.8  --  38.6  PLT 183  --   --   --   --  176  --  166  LABPROT 20.4*  --  20.9*  --   --   --   --   --   INR 1.75*  --  1.81*  --   --   --   --   --   HEPARINUNFRC  --   --   --   --  0.23*  --  0.31 0.39  CREATININE 0.88  --   --   --   --  0.76  --   --   TROPONINI  --   < > 0.13* 0.09*  --  0.07*  --   --   < > = values in this interval not displayed.  Estimated Creatinine Clearance: 64.6 mL/min (by C-G formula based on Cr of 0.76).  Assessment: Heparin for NSTEMI, pending cath Monday.  Heparin level 0.39 - therapeutic x2 on 1150 units/hr  Goal of Therapy:  Heparin level 0.3-0.7 units/ml Monitor platelets by anticoagulation protocol: Yes   Plan:  -Continue heparin to 1150 units/hr to keep in goal -Daily HL and CBC -Follow-up post Cath and possible PCI on Monday  Sloan Leiter, PharmD, BCPS Clinical Pharmacist [phone]  08/07/2015,7:59 AM

## 2015-08-08 ENCOUNTER — Encounter (HOSPITAL_COMMUNITY)
Admission: EM | Disposition: A | Discharge status: Home / Self Care | Payer: Self-pay | Source: Home / Self Care | Attending: Cardiology

## 2015-08-08 ENCOUNTER — Encounter (HOSPITAL_COMMUNITY): Payer: Self-pay | Admitting: Cardiovascular Disease

## 2015-08-08 DIAGNOSIS — I2510 Atherosclerotic heart disease of native coronary artery without angina pectoris: Secondary | ICD-10-CM | POA: Insufficient documentation

## 2015-08-08 DIAGNOSIS — I25110 Atherosclerotic heart disease of native coronary artery with unstable angina pectoris: Secondary | ICD-10-CM | POA: Insufficient documentation

## 2015-08-08 HISTORY — PX: CARDIAC CATHETERIZATION: SHX172

## 2015-08-08 LAB — GLUCOSE, CAPILLARY
Glucose-Capillary: 140 mg/dL — ABNORMAL HIGH (ref 65–99)
Glucose-Capillary: 111 mg/dL — ABNORMAL HIGH (ref 65–99)
Glucose-Capillary: 122 mg/dL — ABNORMAL HIGH (ref 65–99)

## 2015-08-08 LAB — HEMOGLOBIN A1C
Hgb A1c MFr Bld: 7.5 % — ABNORMAL HIGH (ref 4.8–5.6)
Mean Plasma Glucose: 169 mg/dL

## 2015-08-08 LAB — CBC
HCT: 38.7 % (ref 36.0–46.0)
Hemoglobin: 12.8 g/dL (ref 12.0–15.0)
MCH: 30.1 pg (ref 26.0–34.0)
MCHC: 33.1 g/dL (ref 30.0–36.0)
MCV: 91.1 fL (ref 78.0–100.0)
Platelets: 160 10*3/uL (ref 150–400)
RBC: 4.25 MIL/uL (ref 3.87–5.11)
RDW: 14.1 % (ref 11.5–15.5)
WBC: 5.8 10*3/uL (ref 4.0–10.5)

## 2015-08-08 LAB — HEPARIN LEVEL (UNFRACTIONATED): Heparin Unfractionated: 0.38 IU/mL (ref 0.30–0.70)

## 2015-08-08 LAB — PROTIME-INR
INR: 1.22 (ref 0.00–1.49)
Prothrombin Time: 15.6 seconds — ABNORMAL HIGH (ref 11.6–15.2)

## 2015-08-08 SURGERY — LEFT HEART CATH AND CORONARY ANGIOGRAPHY
Anesthesia: LOCAL

## 2015-08-08 MED ORDER — FENTANYL CITRATE (PF) 100 MCG/2ML IJ SOLN
INTRAMUSCULAR | Status: AC
  Filled 2015-08-08: qty 2

## 2015-08-08 MED ORDER — SODIUM CHLORIDE 0.9% FLUSH
3.0000 mL | Freq: Two times a day (BID) | INTRAVENOUS | Status: DC
  Administered 2015-08-08: 3 mL via INTRAVENOUS

## 2015-08-08 MED ORDER — SODIUM CHLORIDE 0.9 % IV SOLN
INTRAVENOUS | Status: AC
Start: 2015-08-08 — End: 2015-08-08

## 2015-08-08 MED ORDER — LIDOCAINE HCL (PF) 1 % IJ SOLN
INTRAMUSCULAR | Status: DC | PRN
  Administered 2015-08-08: 2 mL via INTRADERMAL

## 2015-08-08 MED ORDER — MIDAZOLAM HCL 2 MG/2ML IJ SOLN
INTRAMUSCULAR | Status: AC
  Filled 2015-08-08: qty 2

## 2015-08-08 MED ORDER — SODIUM CHLORIDE 0.9% FLUSH: 3.0000 mL | INTRAVENOUS | Status: DC | PRN

## 2015-08-08 MED ORDER — SODIUM CHLORIDE 0.9 % IV SOLN: INTRAVENOUS | Status: DC

## 2015-08-08 MED ORDER — ASPIRIN 81 MG PO TBEC: 81.0000 mg | DELAYED_RELEASE_TABLET | Freq: Every day | ORAL | Status: DC

## 2015-08-08 MED ORDER — VERAPAMIL HCL 2.5 MG/ML IV SOLN
INTRAVENOUS | Status: DC | PRN
  Administered 2015-08-08: 10 mL via INTRA_ARTERIAL

## 2015-08-08 MED ORDER — SODIUM CHLORIDE 0.9 % IV SOLN: 250.0000 mL | INTRAVENOUS | Status: DC | PRN

## 2015-08-08 MED ORDER — IOPAMIDOL (ISOVUE-370) INJECTION 76%
INTRAVENOUS | Status: AC
  Filled 2015-08-08: qty 50

## 2015-08-08 MED ORDER — MIDAZOLAM HCL 2 MG/2ML IJ SOLN
INTRAMUSCULAR | Status: DC | PRN
  Administered 2015-08-08: 2 mg via INTRAVENOUS

## 2015-08-08 MED ORDER — ASPIRIN 81 MG PO CHEW
81.0000 mg | CHEWABLE_TABLET | ORAL | Status: AC
  Administered 2015-08-08: 81 mg via ORAL
  Filled 2015-08-08: qty 1

## 2015-08-08 MED ORDER — HEPARIN SODIUM (PORCINE) 1000 UNIT/ML IJ SOLN
INTRAMUSCULAR | Status: DC | PRN
  Administered 2015-08-08: 4500 [IU] via INTRAVENOUS

## 2015-08-08 MED ORDER — IOPAMIDOL (ISOVUE-370) INJECTION 76%
INTRAVENOUS | Status: DC | PRN
  Administered 2015-08-08: 100 mL via INTRA_ARTERIAL

## 2015-08-08 MED ORDER — FENTANYL CITRATE (PF) 100 MCG/2ML IJ SOLN
INTRAMUSCULAR | Status: DC | PRN
  Administered 2015-08-08: 25 ug via INTRAVENOUS

## 2015-08-08 MED ORDER — VERAPAMIL HCL 2.5 MG/ML IV SOLN
INTRAVENOUS | Status: AC
  Filled 2015-08-08: qty 2

## 2015-08-08 MED ORDER — IOPAMIDOL (ISOVUE-370) INJECTION 76%
INTRAVENOUS | Status: AC
  Filled 2015-08-08: qty 100

## 2015-08-08 MED ORDER — HEPARIN (PORCINE) IN NACL 2-0.9 UNIT/ML-% IJ SOLN
INTRAMUSCULAR | Status: AC
  Filled 2015-08-08: qty 1000

## 2015-08-08 MED ORDER — HEPARIN SODIUM (PORCINE) 1000 UNIT/ML IJ SOLN
INTRAMUSCULAR | Status: AC
  Filled 2015-08-08: qty 1

## 2015-08-08 MED ORDER — HEPARIN (PORCINE) IN NACL 2-0.9 UNIT/ML-% IJ SOLN
INTRAMUSCULAR | Status: DC | PRN
  Administered 2015-08-08: 1000 mL

## 2015-08-08 MED ORDER — SODIUM CHLORIDE 0.9% FLUSH: 3.0000 mL | Freq: Two times a day (BID) | INTRAVENOUS | Status: DC

## 2015-08-08 MED ORDER — LIDOCAINE HCL (PF) 1 % IJ SOLN
INTRAMUSCULAR | Status: AC
  Filled 2015-08-08: qty 30

## 2015-08-08 SURGICAL SUPPLY — 12 items
CATH INFINITI 5 FR JL3.5 (CATHETERS) ×2 IMPLANT
CATH INFINITI 5FR AL1 (CATHETERS) ×2 IMPLANT
CATH INFINITI 5FR ANG PIGTAIL (CATHETERS) ×2 IMPLANT
CATH INFINITI JR4 5F (CATHETERS) ×2 IMPLANT
DEVICE RAD COMP TR BAND LRG (VASCULAR PRODUCTS) ×2 IMPLANT
GLIDESHEATH SLEND SS 6F .021 (SHEATH) ×2 IMPLANT
KIT HEART LEFT (KITS) ×2 IMPLANT
PACK CARDIAC CATHETERIZATION (CUSTOM PROCEDURE TRAY) ×2 IMPLANT
SYR MEDRAD MARK V 150ML (SYRINGE) ×2 IMPLANT
TRANSDUCER W/STOPCOCK (MISCELLANEOUS) ×2 IMPLANT
TUBING CIL FLEX 10 FLL-RA (TUBING) ×2 IMPLANT
WIRE SAFE-T 1.5MM-J .035X260CM (WIRE) ×2 IMPLANT

## 2015-08-08 NOTE — Care Management Important Message (Signed)
Important Message  Patient Details  Name: Rhonda Garcia MRN: [id number] Date of Birth: 16-Apr-1940   Medicare Important Message Given:  Yes    Nathen May 08/08/2015, 4:19 PM

## 2015-08-08 NOTE — Discharge Summary (Signed)
Discharge Summary    Patient ID: Rhonda Garcia,  MRN: [id number], DOB/AGE: [birth year] 74 y.o.  Admit date: 08/05/2015 Discharge date: 08/08/2015  Primary Care Provider: Evelina Dun Primary Cardiologist: Dr. Percival Spanish  Discharge Diagnoses    Principal Problem:   NSTEMI (non-ST elevated myocardial infarction) [facility]) Active Problems:   Hyperlipidemia   Essential hypertension   CAD, NATIVE VESSEL   Diabetes mellitus type 2, uncomplicated (HCC)   Paroxysmal atrial fibrillation ([place])   Long term current use of anticoagulant therapy   Coronary artery disease involving native coronary artery of native heart with unstable angina pectoris (HCC)   Allergies Allergies  Allergen Reactions  . Promethazine Hcl Anaphylaxis    Diagnostic Studies/Procedures  Left Heart Cath and Coronary Angiography  Prox RCA lesion, 20% stenosed. The lesion was previously treated with a stent (unknown type).  Dist RCA lesion, 50% stenosed.  Post Atrio lesion, 100% stenosed.  Mid RCA lesion, 50% stenosed.  Prox Cx to Mid Cx lesion, 30% stenosed.  2nd Mrg lesion, 50% stenosed.  Ost LAD to Prox LAD lesion, 20% stenosed. The lesion was previously treated with a stent (unknown type).  Dist LAD lesion, 50% stenosed.  Mid LAD to Dist LAD lesion, 50% stenosed.  Ramus lesion, 20% stenosed. The lesion was previously treated with a stent (unknown type).  The left ventricular systolic function is normal.  1. Triple vessel CAD 2. Patent stent LAD with minimal restenosis. Moderate non-obstructive disease mid and distal LAD 3. Moderate non-obstructive disease in the Circumflex 4. Patent stents proximal and mid RCA with minimal restenosis. Moderate non-obstructive disease in the mid and distal RCA. Chronically occluded PLA branch that fills from left to right collaterals.  5. Normal LV systolic function.   Recommendations: No obvious culprit lesions are identified. She does have moderate CAD. Possible  that she had atrial fib with RVR leading to demand ischemia. Continue medical management of CAD.   _____________   History of Present Illness   Rhonda Garcia is a 75 year old female with a past medical history of paroxysmal A. Fib (on Coumadin), DM, CAD status post PCI to RCA in 2010, remote PCI to ramus, 4 stents to LAD  In 2007.  She had an intermediate risk Lexiscan Myoview on 08/03/2015. She presented to the ED on 08/05/2015 with chest pain, shortness of breath, and elevated troponin. Eft heart cath was completed on 08/08/2015.  Hospital Course  Mr. Ivanhoe resented to the ED after having shortness of breath, and chest pain. He had just had an intermediate risk nuclear study 2 days prior,that showed a small defect of moderate severity present in the mid anterior and apical anterior location. This was considered in intermediate risk study. When she presented with chest pain plan was for left heart cath given recent intermediate risk study.  Troponin was elevated at 0.14 upon presentation. It has decreased to 0.07.EKG shows sinus bradycardia with no ST changes concerning for ischemia.   Left heart cath on 08/08/2015 revealed patent LAD stent with minimal restenosis. There was moderate and nonobstructive disease in the mid and distal LAD. There was also moderate nonobstructive disease in the mid and distal RCA and a chronically occluded PLA branch that fills from left and right collaterals. Plan is for medical management of moderate CAD. She is on high-dose statin. She is on calcium channel blocker, long-acting nitrates, ACE inhibitor, and beta blocker therapy. She is also on spironolactone. Last echo in April 2017 shows LV function of 60-65%. LV  function was also normal by cath. She does have grade 2 diastolic dysfunction.  Right radial site is stable with no hematoma. Patient has been chest pain-free since admission. She can resume her Coumadin and follow-up with her usual clinic to get her INR  checked in 2 days.  Patient was seen by Dr. Burt Knack and deemed suitable for discharge.  Discharge instructions complete.she was advised to wait 48 hours before resuming her metformin. Renal function is within normal limits. She was prescribed aspirin on discharge. _____________  Discharge Vitals Blood pressure 135/62, pulse 0, temperature 98.1 F (36.7 C), temperature source Oral, resp. rate 15, height 5\' 2"  (1.575 m), weight 199 lb 8 oz (90.493 kg), SpO2 0 %.  Filed Weights   08/06/15 0305 08/07/15 0104 08/08/15 0500  Weight: 199 lb 1.6 oz (90.311 kg) 199 lb 11.8 oz (90.6 kg) 199 lb 8 oz (90.493 kg)    Labs & Radiologic Studies     CBC  Recent Labs  08/07/15 0627 08/08/15 0520  WBC 7.1 5.8  HGB 12.5 12.8  HCT 38.6 38.7  MCV 91.3 91.1  PLT 166 0000000   Basic Metabolic Panel  Recent Labs  08/05/15 2000 08/06/15 0829 08/07/15 1028  NA  --  142 140  K  --  4.0 3.8  CL  --  109 107  CO2  --  24 24  GLUCOSE  --  165* 193*  BUN  --  16 13  CREATININE  --  0.76 0.82  CALCIUM  --  8.9 9.1  MG 1.8  --   --    Liver Function Tests  Recent Labs  08/05/15 2000  AST 19  ALT 19  ALKPHOS 64  BILITOT 0.9  PROT 5.9*  ALBUMIN 3.3*   Cardiac Enzymes  Recent Labs  08/05/15 2000 08/06/15 0200 08/06/15 0829  TROPONINI 0.13* 0.09* 0.07*    Hemoglobin A1C  Recent Labs  08/05/15 1953  HGBA1C 7.5*   Fasting Lipid Panel  Recent Labs  08/06/15 0200  CHOL 137  HDL 36*  LDLCALC 80  TRIG 105  CHOLHDL 3.8   Thyroid Function Tests  Recent Labs  08/05/15 2000  TSH 1.555    Dg Chest 2 View  08/05/2015  CLINICAL DATA:  Shortness of breath with exertion for the last few days. Chest pain last night. Headache, nausea, back and arm pain this morning. History of cardiac stent placement. EXAM: CHEST  2 VIEW COMPARISON:  Chest x-ray dated 01/12/2015. FINDINGS: Heart size is upper normal, stable. Mild atelectasis versus scarring at the left lung base. Lungs otherwise  clear. No pleural effusion or pneumothorax seen. Stable elevation the right hemidiaphragm. Mild degenerative change within the thoracic spine. IMPRESSION: Probable mild atelectasis or scarring at the left lung base. Lungs are otherwise clear with no other evidence of acute cardiopulmonary abnormality. Electronically Signed   By: Franki Cabot M.D.   On: 08/05/2015 13:58    Disposition   Pt is being discharged home today in good condition.  Follow-up Plans & Appointments  Our office will contact her with an appointment in the next 2 weeks.     Discharge Medications   Current Discharge Medication List    CONTINUE these medications which have NOT CHANGED   Details  ALPRAZolam (XANAX) 0.5 MG tablet Take 1 tablet (0.5 mg total) by mouth at bedtime as needed for anxiety. Qty: 60 tablet, Refills: 3   Associated Diagnoses: Depression; GAD (generalized anxiety disorder)    amLODipine (NORVASC) 10  MG tablet Take 1 tablet (10 mg total) by mouth daily. Qty: 90 tablet, Refills: 3   Associated Diagnoses: Essential hypertension    cyclobenzaprine (FLEXERIL) 5 MG tablet Take 1 tablet (5 mg total) by mouth 3 (three) times daily as needed for muscle spasms. Qty: 30 tablet, Refills: 0   Associated Diagnoses: Chest wall muscle strain, initial encounter    isosorbide mononitrate (IMDUR) 30 MG 24 hr tablet Take 1 tablet (30 mg total) by mouth daily. Qty: 90 tablet, Refills: 3    levothyroxine (LEVOTHROID) 25 MCG tablet Take 1 tablet (25 mcg total) by mouth daily before breakfast. Qty: 90 tablet, Refills: 1   Associated Diagnoses: Hypothyroidism, unspecified hypothyroidism type    Linaclotide (LINZESS) 145 MCG CAPS capsule Take 1 capsule (145 mcg total) by mouth daily. Qty: 90 capsule, Refills: 3    lisinopril-hydrochlorothiazide (PRINZIDE,ZESTORETIC) 20-12.5 MG per tablet Take 2 tablets by mouth daily. Qty: 180 tablet, Refills: 6   Associated Diagnoses: Unspecified essential hypertension      metFORMIN (GLUCOPHAGE) 500 MG tablet TAKE TWO TABLETS BY MOUTH EVERY MORNING Qty: 180 tablet, Refills: 2    metoprolol (LOPRESSOR) 50 MG tablet TAKE ONE TABLET BY MOUTH TWICE DAILY Qty: 60 tablet, Refills: 5    naproxen (NAPROSYN) 500 MG tablet Take 1 tablet (500 mg total) by mouth 2 (two) times daily with a meal. Qty: 30 tablet, Refills: 0   Associated Diagnoses: Chest wall muscle strain, initial encounter    nitroGLYCERIN (NITROSTAT) 0.4 MG SL tablet Place 0.4 mg under the tongue every 5 (five) minutes as needed for chest pain.     pravastatin (PRAVACHOL) 80 MG tablet Take 1 tablet (80 mg total) by mouth daily. Qty: 90 tablet, Refills: 3   Associated Diagnoses: Hyperlipidemia    ranitidine (ZANTAC) 150 MG tablet TAKE ONE TABLET BY MOUTH TWICE DAILY Qty: 60 tablet, Refills: 3    sertraline (ZOLOFT) 100 MG tablet Take 1 tablet (100 mg total) by mouth daily. Qty: 90 tablet, Refills: 0    spironolactone (ALDACTONE) 25 MG tablet Take 1 tablet (25 mg total) by mouth daily. Qty: 30 tablet, Refills: 6    warfarin (COUMADIN) 5 MG tablet TAKE ONE (1) TABLET EACH DAY Qty: 30 tablet, Refills: 1         Aspirin prescribed at discharge? Yes High Intensity Statin Prescribed? Yes Beta Blocker Prescribed?Yes For EF 45% or less, Was ACEI/ARB Prescribed? Yes ADP Receptor Inhibitor Prescribed? No For EF <40%, Aldosterone Inhibitor Prescribed? Ef normal  Was EF assessed during THIS hospitalization? No  Was Cardiac Rehab II ordered?No    Outstanding Labs/Studies    Duration of Discharge Encounter   Greater than 30 minutes including physician time.  Signed, Arbutus Leas NP 08/08/2015, 4:54 PM

## 2015-08-08 NOTE — Progress Notes (Signed)
ANTICOAGULATION CONSULT NOTE - Follow Up Consult  Pharmacy Consult:  Heparin  Indication: chest pain/ACS  Allergies  Allergen Reactions  . Promethazine Hcl Anaphylaxis   Patient Measurements: Height: 5\' 2"  (157.5 cm) Weight: 199 lb 8 oz (90.493 kg) IBW/kg (Calculated) : 50.1  Heparin dosing weight: 70 kg  Vital Signs: Temp: 98.1 F (36.7 C) (05/01 0021) Temp Source: Oral (05/01 0021) BP: 124/64 mmHg (05/01 0021) Pulse Rate: 62 (05/01 0021)  Labs:  Recent Labs  08/05/15 1303  08/05/15 2000 08/06/15 0200  08/06/15 0829 08/06/15 1437 08/07/15 0627 08/07/15 1028 08/08/15 0520  HGB 13.2  --   --   --   --  12.8  --  12.5  --  12.8  HCT 41.3  --   --   --   --  40.8  --  38.6  --  38.7  PLT 183  --   --   --   --  176  --  166  --  160  LABPROT 20.4*  --  20.9*  --   --   --   --   --   --   --   INR 1.75*  --  1.81*  --   --   --   --   --   --   --   HEPARINUNFRC  --   --   --   --   < >  --  0.31 0.39  --  0.38  CREATININE 0.88  --   --   --   --  0.76  --   --  0.82  --   TROPONINI  --   < > 0.13* 0.09*  --  0.07*  --   --   --   --   < > = values in this interval not displayed.  Estimated Creatinine Clearance: 63 mL/min (by C-G formula based on Cr of 0.82).   Assessment: 52 YOF continues on heparin for NSTEMI.  Heparin level therapeutic; no bleeding reported.   Goal of Therapy:  Heparin level 0.3-0.7 units/ml Monitor platelets by anticoagulation protocol: Yes    Plan:  - Continue heparin gtt at 1150 units/hr - Daily HL / CBC - F/U with resuming Coumadin when possible   Yianni Skilling D. Mina Marble, PharmD, BCPS Pager:  [phone] 08/08/2015, 8:41 AM

## 2015-08-08 NOTE — H&P (View-Only) (Signed)
SUBJECTIVE:  No further CP  OBJECTIVE:   Vitals:   Filed Vitals:   08/06/15 2152 08/07/15 0104 08/07/15 0416 08/07/15 0814  BP: 159/64 148/67 140/68 140/60  Pulse: 69 58 60 73  Temp:  98.4 F (36.9 C) 98.6 F (37 C) 98 F (36.7 C)  TempSrc:  Oral Oral Oral  Resp:  16 19 20   Height:      Weight:  199 lb 11.8 oz (90.6 kg)    SpO2:  95% 95% 96%   I&O's:   Intake/Output Summary (Last 24 hours) at 08/07/15 0949 Last data filed at 08/07/15 [id number]  Gross per 24 hour  Intake 1432.24 ml  Output    800 ml  Net 632.24 ml   TELEMETRY: Reviewed telemetry pt in NSR:     PHYSICAL EXAM General: Well developed, well nourished, in no acute distress Head: Eyes PERRLA, No xanthomas.   Normal cephalic and atramatic  Lungs:   Clear bilaterally to auscultation and percussion. Heart:   HRRR S1 S2 Pulses are 2+ & equal. Abdomen: Bowel sounds are positive, abdomen soft and non-tender without masses Extremities:   No clubbing, cyanosis or edema.  DP +1 Neuro: Alert and oriented X 3. Psych:  Good affect, responds appropriately   LABS: Basic Metabolic Panel:  Recent Labs  08/05/15 1303 08/05/15 2000 08/06/15 0829  NA 143  --  142  K 4.3  --  4.0  CL 109  --  109  CO2 26  --  24  GLUCOSE 169*  --  165*  BUN 14  --  16  CREATININE 0.88  --  0.76  CALCIUM 9.2  --  8.9  MG  --  1.8  --    Liver Function Tests:  Recent Labs  08/05/15 2000  AST 19  ALT 19  ALKPHOS 64  BILITOT 0.9  PROT 5.9*  ALBUMIN 3.3*   No results for input(s): LIPASE, AMYLASE in the last 72 hours. CBC:  Recent Labs  08/06/15 0829 08/07/15 0627  WBC 8.1 7.1  HGB 12.8 12.5  HCT 40.8 38.6  MCV 92.7 91.3  PLT 176 166   Cardiac Enzymes:  Recent Labs  08/05/15 2000 08/06/15 0200 08/06/15 0829  TROPONINI 0.13* 0.09* 0.07*   BNP: Invalid input(s): POCBNP D-Dimer: No results for input(s): DDIMER in the last 72 hours. Hemoglobin A1C: No results for input(s): HGBA1C in the last 72  hours. Fasting Lipid Panel:  Recent Labs  08/06/15 0200  CHOL 137  HDL 36*  LDLCALC 80  TRIG 105  CHOLHDL 3.8   Thyroid Function Tests:  Recent Labs  08/05/15 2000  TSH 1.555   Anemia Panel: No results for input(s): VITAMINB12, FOLATE, FERRITIN, TIBC, IRON, RETICCTPCT in the last 72 hours. Coag Panel:   Lab Results  Component Value Date   INR 1.81* 08/05/2015   INR 1.75* 08/05/2015   INR 2.9* 07/21/2015    RADIOLOGY: Dg Chest 2 View  08/05/2015  CLINICAL DATA:  Shortness of breath with exertion for the last few days. Chest pain last night. Headache, nausea, back and arm pain this morning. History of cardiac stent placement. EXAM: CHEST  2 VIEW COMPARISON:  Chest x-ray dated 01/12/2015. FINDINGS: Heart size is upper normal, stable. Mild atelectasis versus scarring at the left lung base. Lungs otherwise clear. No pleural effusion or pneumothorax seen. Stable elevation the right hemidiaphragm. Mild degenerative change within the thoracic spine. IMPRESSION: Probable mild atelectasis or scarring at the left lung base. Lungs are  otherwise clear with no other evidence of acute cardiopulmonary abnormality. Electronically Signed   By: Franki Cabot M.D.   On: 08/05/2015 13:58    Assessment/Plan 75 year old woman with known history of CAD and PAF who has had aggressively worsening exertional dyspnea evaluated with Myoview that was read as "intermediate risk "with possible anterior ischemia. She now presented following a prolonged episode where she felt like she was may be in atrial fibrillation with significant dyspnea, orthopnea and chest tightness/pressure consistent with unstable (rest) angina. Her initial troponin is slightly elevated, but EKG is totally normal.  Principal Problem:  NSTEMI (non-ST elevated myocardial infarction) ([place]) Active Problems:  CAD, NATIVE VESSEL  Hyperlipidemia  Essential hypertension  Diabetes mellitus type 2, uncomplicated (HCC)  Paroxysmal  atrial fibrillation ([place])  Long term current use of anticoagulant therapy Principal Problem:  NSTEMI (non-ST elevated myocardial infarction) ([place])  1. NSTEMI - She is on aspirin, statin, beta blocker and ACE inhibitor. Coumadin stopped and now on IV heparin. Continue BB/statin/ASA/IV NTG. She is currently scheduled for cardiac catheterization with coronary angiography and possible PCI on Monday morning. However if she has more recurrent symptoms over the weekend, we may be inclined to proceed to the cath lab sooner. 2. ASCAD prior PCI of the LAD/ramus of LCx/RCA. She is on aspirin, statin, beta blocker and ACE inhibitor. 3. HTN - relatively well-controlled. Will continue home medications with lisinopril and no HCTZ as we are using Lasix for dyspnea. Continue amlodipine/BB. 3. Hyperlipidemia - on pravastatin. LDL 80 and goal < 70. Pravastatin d/c'd and started Lipitor 80mg  daily.  4. Diabetes mellitus type 2, uncomplicated ([place]) -- she is on insulin at home. We will use Lantus with sliding scale insulin 5. Paroxysmal atrial fibrillation (HCC) - currently in normal sinus rhythm. Holding warfarin. If stent is recommended, would consider Synergy Stent or inclusion in [place], MD  08/07/2015  9:49 AM

## 2015-08-08 NOTE — Interval H&P Note (Signed)
History and Physical Interval Note:  08/08/2015 10:26 AM  Rhonda Garcia  has presented today for cardiac cath with the diagnosis of known CAD, NSTEMI.  The various methods of treatment have been discussed with the patient and family. After consideration of risks, benefits and other options for treatment, the patient has consented to  Procedure(s): Left Heart Cath and Coronary Angiography (N/A) as a surgical intervention .  The patient's history has been reviewed, patient examined, no change in status, stable for surgery.  I have reviewed the patient's chart and labs.  Questions were answered to the patient's satisfaction.     MCALHANY,CHRISTOPHER

## 2015-08-08 NOTE — Discharge Instructions (Signed)

## 2015-08-08 NOTE — Progress Notes (Signed)
Patient Name: Rhonda Garcia Date of Encounter: 08/08/2015  Principal Problem:   NSTEMI (non-ST elevated myocardial infarction) [facility]) Active Problems:   Hyperlipidemia   Essential hypertension   CAD, NATIVE VESSEL   Diabetes mellitus type 2, uncomplicated (HCC)   Paroxysmal atrial fibrillation ([place])   Long term current use of anticoagulant therapy   Primary Cardiologist: Dr. Percival Spanish  Patient Profile: Rhonda Garcia is a 75 year old female with a past medical history of paroxysmal Afib (on Coumadin), DM, CAD s/p  PCI to RCA in 2010 and remote PCI to ramus, and 4 stents to LAD. She had an intermediate risk Lexiscan Myoview on 08/03/15, she presented to the ED on 08/05/15 with chest pain, elevated troponin and worsening SOB. Plan for cath today, 08/08/15  SUBJECTIVE: Feels well, no chest pain or SOB over night.  She is on the way to the cath lab.   OBJECTIVE Filed Vitals:   08/07/15 1625 08/07/15 2024 08/08/15 0021 08/08/15 0500  BP: 134/60 139/59 124/64   Pulse: 63 66 62   Temp: 98.2 F (36.8 C) 98.1 F (36.7 C) 98.1 F (36.7 C)   TempSrc: Oral Oral Oral   Resp: 20 19 15    Height:      Weight:    199 lb 8 oz (90.493 kg)  SpO2: 97% 94% 96%     Intake/Output Summary (Last 24 hours) at 08/08/15 0949 Last data filed at 08/07/15 1923  Gross per 24 hour  Intake    600 ml  Output      0 ml  Net    600 ml   Filed Weights   08/06/15 0305 08/07/15 0104 08/08/15 0500  Weight: 199 lb 1.6 oz (90.311 kg) 199 lb 11.8 oz (90.6 kg) 199 lb 8 oz (90.493 kg)    PHYSICAL EXAM General: Well developed, well nourished, female in no acute distress. Head: Normocephalic, atraumatic.  Neck: Supple without bruits, No JVD. Lungs:  Resp regular and unlabored, CTA. Heart: RRR, S1, S2, no S3, S4, or murmur; no rub. Abdomen: Soft, non-tender, non-distended, BS + x 4.  Extremities: No clubbing, cyanosis, No edema.  Neuro: Alert and oriented X 3. Moves all extremities spontaneously. Psych: Normal  affect.  LABS: CBC: Recent Labs  08/07/15 0627 08/08/15 0520  WBC 7.1 5.8  HGB 12.5 12.8  HCT 38.6 38.7  MCV 91.3 91.1  PLT 166 160   INR: Recent Labs  08/08/15 0905  INR [id number]   Basic Metabolic Panel: Recent Labs  08/05/15 2000 08/06/15 0829 08/07/15 1028  NA  --  142 140  K  --  4.0 3.8  CL  --  109 107  CO2  --  24 24  GLUCOSE  --  165* 193*  BUN  --  16 13  CREATININE  --  0.76 0.82  CALCIUM  --  8.9 9.1  MG 1.8  --   --    Liver Function Tests: Recent Labs  08/05/15 2000  AST 19  ALT 19  ALKPHOS 64  BILITOT 0.9  PROT 5.9*  ALBUMIN 3.3*   Cardiac Enzymes: Recent Labs  08/05/15 2000 08/06/15 0200 08/06/15 0829  TROPONINI 0.13* 0.09* 0.07*    Recent Labs  08/05/15 1604  TROPIPOC 0.10*   Fasting Lipid Panel: Recent Labs  08/06/15 0200  CHOL 137  HDL 36*  LDLCALC 80  TRIG 105  CHOLHDL 3.8   Thyroid Function Tests: Recent Labs  08/05/15 2000  TSH 1.555  Current facility-administered medications:  .  0.9 %  sodium chloride infusion, , Intravenous, Continuous, Isaiah Serge, NP, Last Rate: 10 mL/hr at 08/05/15 2044 .  0.9 %  sodium chloride infusion, 250 mL, Intravenous, PRN, Arbutus Leas, NP .  0.9 %  sodium chloride infusion, , Intravenous, Continuous, Arbutus Leas, NP .  acetaminophen (TYLENOL) tablet 650 mg, 650 mg, Oral, Q4H PRN, Isaiah Serge, NP, 650 mg at 08/07/15 1208 .  ALPRAZolam Duanne Moron) tablet 0.5 mg, 0.5 mg, Oral, QHS PRN, Isaiah Serge, NP .  amLODipine (NORVASC) tablet 10 mg, 10 mg, Oral, Daily, Isaiah Serge, NP, 10 mg at 08/08/15 0824 .  aspirin chewable tablet 81 mg, 81 mg, Oral, Pre-Cath, Arbutus Leas, NP .  aspirin EC tablet 81 mg, 81 mg, Oral, Daily, Isaiah Serge, NP, 81 mg at 08/08/15 0824 .  atorvastatin (LIPITOR) tablet 80 mg, 80 mg, Oral, q1800, Sueanne Margarita, MD, 80 mg at 08/07/15 1801 .  cyclobenzaprine (FLEXERIL) tablet 5 mg, 5 mg, Oral, TID PRN, Isaiah Serge, NP .  famotidine (PEPCID) tablet  20 mg, 20 mg, Oral, BID, Almyra Deforest, PA, 20 mg at 08/08/15 0824 .  heparin ADULT infusion 100 units/mL (25000 units/250 mL), 1,150 Units/hr, Intravenous, Continuous, Priscella Mann, RPH, Last Rate: 11.5 mL/hr at 08/07/15 1833, 1,150 Units/hr at 08/07/15 1833 .  insulin aspart (novoLOG) injection 0-5 Units, 0-5 Units, Subcutaneous, QHS, Isaiah Serge, NP, 2 Units at 08/05/15 2226 .  insulin aspart (novoLOG) injection 0-9 Units, 0-9 Units, Subcutaneous, TID WC, Isaiah Serge, NP, 1 Units at 08/08/15 [phone] .  levothyroxine (SYNTHROID, LEVOTHROID) tablet 25 mcg, 25 mcg, Oral, QAC breakfast, Isaiah Serge, NP, 25 mcg at 08/08/15 0824 .  linaclotide (LINZESS) capsule 145 mcg, 145 mcg, Oral, Daily, Isaiah Serge, NP, 145 mcg at 08/07/15 0901 .  metoprolol (LOPRESSOR) tablet 50 mg, 50 mg, Oral, BID, Isaiah Serge, NP, 50 mg at 08/08/15 0824 .  nitroGLYCERIN (NITROSTAT) SL tablet 0.4 mg, 0.4 mg, Sublingual, Q5 min PRN, Isaiah Serge, NP .  nitroGLYCERIN (NITROSTAT) SL tablet 0.4 mg, 0.4 mg, Sublingual, Q5 Min x 3 PRN, Isaiah Serge, NP .  nitroGLYCERIN 50 mg in dextrose 5 % 250 mL (0.2 mg/mL) infusion, 0-200 mcg/min, Intravenous, Titrated, Sueanne Margarita, MD, Last Rate: 3 mL/hr at 08/07/15 1542, 10 mcg/min at 08/07/15 1542 .  ondansetron (ZOFRAN) injection 4 mg, 4 mg, Intravenous, Q6H PRN, Isaiah Serge, NP .  sertraline (ZOLOFT) tablet 100 mg, 100 mg, Oral, Daily, Isaiah Serge, NP, 100 mg at 08/08/15 0824 .  sodium chloride flush (NS) 0.9 % injection 3 mL, 3 mL, Intravenous, Q12H, Arbutus Leas, NP .  sodium chloride flush (NS) 0.9 % injection 3 mL, 3 mL, Intravenous, PRN, Arbutus Leas, NP .  spironolactone (ALDACTONE) tablet 25 mg, 25 mg, Oral, Daily, Isaiah Serge, NP, 25 mg at 08/07/15 0900 .  zolpidem (AMBIEN) tablet 5 mg, 5 mg, Oral, QHS PRN, Isaiah Serge, NP . sodium chloride 10 mL/hr at 08/05/15 2044  . sodium chloride    . heparin 1,150 Units/hr (08/07/15 1833)  . nitroGLYCERIN 10  mcg/min (08/07/15 1542)    TELE:  NSR   ECG: NSR, no ST changes.    Current Medications:  . amLODipine  10 mg Oral Daily  . aspirin  81 mg Oral Pre-Cath  . aspirin EC  81 mg Oral Daily  . atorvastatin  80 mg  Oral q1800  . famotidine  20 mg Oral BID  . insulin aspart  0-5 Units Subcutaneous QHS  . insulin aspart  0-9 Units Subcutaneous TID WC  . levothyroxine  25 mcg Oral QAC breakfast  . linaclotide  145 mcg Oral Daily  . metoprolol  50 mg Oral BID  . sertraline  100 mg Oral Daily  . sodium chloride flush  3 mL Intravenous Q12H  . spironolactone  25 mg Oral Daily   . sodium chloride 10 mL/hr at 08/05/15 2044  . sodium chloride    . heparin 1,150 Units/hr (08/07/15 1833)  . nitroGLYCERIN 10 mcg/min (08/07/15 1542)    ASSESSMENT AND PLAN: Principal Problem:   NSTEMI (non-ST elevated myocardial infarction) ([place]) Active Problems:   Hyperlipidemia   Essential hypertension   CAD, NATIVE VESSEL   Diabetes mellitus type 2, uncomplicated (HCC)   Paroxysmal atrial fibrillation ([place])   Long term current use of anticoagulant therapy  1. NSTEMI: Patient presented with SOB, elevated troponin and chest pain.  She is going for cath today, as she had intermediate risk nuclear study last week that showed possible ischemia in mid anterior and apical anterior location. She does have a history of CAD, s/p PCI x 4 to LAD and PCI to RCA and ramus in 2010 and 2007 respectively. She is on ASA, heparin gtt. Coumadin stopped. On beta blocker. We will await cath results.   2. HTN: Well controlled on CCB and BB. Spironolactone was added to her regimen this month in the outpatient setting as well for better control.   3. HLD: On high dose statin, LDL is 80.   4. DM type 2: A1C pending. She is on sliding scale.   5. PAF: In NSR now, review of tele shows NSR since admission.  She is on Coumadin. Rate controlled on metoprolol.  This patients CHA2DS2-VASc Score and unadjusted Ischemic Stroke Rate (%  per year) is equal to 4.8 % stroke rate/year from a score of 4 Above score calculated as 1 point each if present [CHF, HTN, DM, Vascular=MI/PAD/Aortic Plaque, Age if 65-74, or Female], 2 points each if present [Age > 75, or Stroke/TIA/TE]  Signed, Arbutus Leas , NP 9:49 AM 08/08/2015 Pager [phone]  I have personally seen and examined this patient with Jettie Booze, NP. I agree with the assessment and plan as outlined above. Plans for cardiac cath today. She will be restarted on coumadin following the cath.   MCALHANY,CHRISTOPHER 08/08/2015 11:25 AM

## 2015-08-09 ENCOUNTER — Other Ambulatory Visit: Payer: Self-pay | Admitting: Family

## 2015-08-09 NOTE — Progress Notes (Signed)
Pt discharged to home via w/c, accompanied by son, condition stable, d/c instructions given, pt verbalized understanding of follow up appointments and medications. Edward Qualia RN

## 2015-08-09 NOTE — Telephone Encounter (Signed)
Last seen 07/21/15  Rhonda Garcia   If approved route to nurse to call into [facility]

## 2015-08-09 NOTE — Telephone Encounter (Signed)
rx called into pharmacy

## 2015-08-10 ENCOUNTER — Ambulatory Visit: Payer: Medicare Other | Admitting: Cardiology

## 2015-08-10 ENCOUNTER — Encounter: Payer: Self-pay | Admitting: *Deleted

## 2015-08-24 ENCOUNTER — Encounter: Payer: Self-pay | Admitting: Pharmacist

## 2015-08-31 ENCOUNTER — Ambulatory Visit (INDEPENDENT_AMBULATORY_CARE_PROVIDER_SITE_OTHER): Payer: Medicare Other | Admitting: Cardiology

## 2015-08-31 ENCOUNTER — Ambulatory Visit (INDEPENDENT_AMBULATORY_CARE_PROVIDER_SITE_OTHER): Payer: Medicare Other | Admitting: Pharmacist

## 2015-08-31 ENCOUNTER — Encounter: Payer: Self-pay | Admitting: Cardiology

## 2015-08-31 ENCOUNTER — Encounter (INDEPENDENT_AMBULATORY_CARE_PROVIDER_SITE_OTHER): Payer: Self-pay

## 2015-08-31 VITALS — BP 142/72 | HR 60 | Ht 62.0 in | Wt 203.0 lb

## 2015-08-31 DIAGNOSIS — I25119 Atherosclerotic heart disease of native coronary artery with unspecified angina pectoris: Secondary | ICD-10-CM | POA: Diagnosis not present

## 2015-08-31 DIAGNOSIS — I480 Paroxysmal atrial fibrillation: Secondary | ICD-10-CM

## 2015-08-31 DIAGNOSIS — Z7901 Long term (current) use of anticoagulants: Secondary | ICD-10-CM

## 2015-08-31 LAB — COAGUCHEK XS/INR WAIVED
INR: 2.1 — ABNORMAL HIGH (ref 0.9–1.1)
Prothrombin Time: 25.6 s

## 2015-08-31 MED ORDER — ISOSORBIDE MONONITRATE ER 60 MG PO TB24: 60.0000 mg | ORAL_TABLET | Freq: Every day | ORAL | Status: DC

## 2015-08-31 NOTE — Progress Notes (Signed)
HPI The patient presents for evaluation of known coronary disease and atrial fibrillation. She has a history of stenting as described below.   She was having some chest pain so I sent her for a [employer].  This demonstrated some intermediate defect.  Two days after the study she had arrhythmia and pain and presented to the ED.  I reviewed these records.  She was in normal sinus but did have elevated enzymes.  Cath demonstrated nonobstructive disease in large vessels with an occluded PL off the RCA.  She had patent stents in the RI, RCA and LAD.  She was managed medically.  Since that time she has had no further pain.  She has had no further palpitations.   She is dancing.  The patient denies any new symptoms such as chest discomfort, neck or arm discomfort. There has been no new shortness of breath, PND or orthopnea. There have been no reported palpitations, presyncope or syncope.   Allergies  Allergen Reactions  . Promethazine Hcl Anaphylaxis    Current Outpatient Prescriptions  Medication Sig Dispense Refill  . amLODipine (NORVASC) 10 MG tablet Take 1 tablet (10 mg total) by mouth daily. 90 tablet 3  . aspirin EC 81 MG EC tablet Take 1 tablet (81 mg total) by mouth daily.    . cyclobenzaprine (FLEXERIL) 5 MG tablet Take 1 tablet (5 mg total) by mouth 3 (three) times daily as needed for muscle spasms. (Patient taking differently: Take 5 mg by mouth at bedtime. ) 30 tablet 0  . isosorbide mononitrate (IMDUR) 30 MG 24 hr tablet Take 1 tablet (30 mg total) by mouth daily. 90 tablet 3  . levothyroxine (LEVOTHROID) 25 MCG tablet Take 1 tablet (25 mcg total) by mouth daily before breakfast. 90 tablet 1  . Linaclotide (LINZESS) 145 MCG CAPS capsule Take 1 capsule (145 mcg total) by mouth daily. 90 capsule 3  . lisinopril-hydrochlorothiazide (PRINZIDE,ZESTORETIC) 20-12.5 MG per tablet Take 2 tablets by mouth daily. 180 tablet 6  . metFORMIN (GLUCOPHAGE) 500 MG tablet TAKE TWO TABLETS BY MOUTH  EVERY MORNING 180 tablet 2  . metoprolol (LOPRESSOR) 50 MG tablet TAKE ONE TABLET BY MOUTH TWICE DAILY 60 tablet 5  . naproxen (NAPROSYN) 500 MG tablet Take 1 tablet (500 mg total) by mouth 2 (two) times daily with a meal. (Patient taking differently: Take 500 mg by mouth daily as needed for mild pain. ) 30 tablet 0  . nitroGLYCERIN (NITROSTAT) 0.4 MG SL tablet Place 0.4 mg under the tongue every 5 (five) minutes as needed for chest pain.     . pravastatin (PRAVACHOL) 80 MG tablet Take 1 tablet (80 mg total) by mouth daily. 90 tablet 3  . ranitidine (ZANTAC) 150 MG tablet TAKE ONE TABLET BY MOUTH TWICE DAILY 60 tablet 3  . sertraline (ZOLOFT) 100 MG tablet Take 1 tablet (100 mg total) by mouth daily. 90 tablet 0  . spironolactone (ALDACTONE) 25 MG tablet Take 1 tablet (25 mg total) by mouth daily. 30 tablet 6  . warfarin (COUMADIN) 5 MG tablet TAKE ONE (1) TABLET EACH DAY (Patient taking differently: TAKE ONE (1) TABLET EACH evening) 30 tablet 1   No current facility-administered medications for this visit.    Past Medical History  Diagnosis Date  . Diabetes mellitus   . CAD (coronary artery disease)   . GERD (gastroesophageal reflux disease)   . Osteopenia   . Hypertension   . Hyperlipidemia   . Anal fissure  resolved   . DDD (degenerative disc disease)   . Anemia   . Airway compromise     Inability to tolerate continuous positive airway pressure  . Hiatal hernia   . Dyslipidemia   . Thyroid disease   . Hearing loss   . Generalized headaches   . Sebaceous cyst     on back  . Back pain     with left radiculopathy  . Hx of myocardial infarction   . Depression   . Hx of peptic ulcer   . A-fib ([place])   . Sleep apnea     not using CPAP now, could not tolerate and PCP aware.  . NSTEMI (non-ST elevated myocardial infarction) ([place]) 08/05/2015    Past Surgical History  Procedure Laterality Date  . Knee surgery Left     arthroscopy  . Appendectomy    . Breast reduction  surgery    . Esophagogastroduodenoscopy  06/2007    Small hiatal hernia  . Colonoscopy  06/2007    Friable anal canal and anal papillae. Pancolonic diverticula. Normal terminal ileum. Status post segmental biopsy, descending colon biopsy showed minimal cryptitis. Biopsies felt to be  nonspecific but could be seen with NSAIDs. No features of inflammatory bowel disease. Stool studies were negative.  . Colonoscopy  03/21/2011    RMR: colonic polyps treated as described above, colonic diverticulosis (Tubular adenomas)  . Cholecystectomy  2008 - approximate  . Abdominal hysterectomy  2001 - approximate  . Lipoma excision  03/17/2012    Procedure: EXCISION LIPOMA;  Surgeon: Gwenyth Ober, MD;  Location: [place];  Service: General;  Laterality: Right;  excision of right lower back lipoma  . Esophagogastroduodenoscopy  03/21/2011    RMR: normal esophagus- status post passage of Maloney dilator. Small hiatal hernia. Trivial antral and bulbar erosions  . Coronary angioplasty with stent placement      4 stents  . Esophagogastroduodenoscopy (egd) with propofol N/A 06/17/2014    RMR: Small hiatal hernia; otherwise normal EGD. Status post passage of a Maloney dilator. No explanation for patients right upper quadrant abdominal pain.   . Esophageal dilation N/A 06/17/2014    Procedure: ESOPHAGEAL DILATION;  Surgeon: Daneil Dolin, MD;  Location: [facility];  Service: Endoscopy;  Laterality: N/A;  Maloney 14 Fr  . Colonoscopy with propofol N/A 06/17/2014    RMR: Colonic diverticulosis. Multiple colonic polyps removed as described above.   . Polypectomy  06/17/2014    Procedure: POLYPECTOMY;  Surgeon: Daneil Dolin, MD;  Location: [facility];  Service: Endoscopy;;  . Cardiac catheterization N/A 08/08/2015    Procedure: Left Heart Cath and Coronary Angiography;  Surgeon: Burnell Blanks, MD;  Location: [place];  Service: Cardiovascular;  Laterality: N/A;    ROS:  As stated in the HPI and  negative for all other systems.  PHYSICAL EXAM BP 142/72 mmHg  Pulse 60  Ht 5\' 2"  (1.575 m)  Wt 203 lb (92.08 kg)  BMI 37.12 kg/m2 GENERAL:  Well appearing HEENT:  Pupils equal round and reactive, fundi not visualized, oral mucosa unremarkable, dentures NECK:  No jugular venous distention, waveform within normal limits, carotid upstroke brisk and symmetric, no bruits, no thyromegaly LUNGS:  Clear to auscultation bilaterally BACK:  No CVA tenderness CHEST:  Unremarkable HEART:  PMI not displaced or sustained,S1 and S2 within normal limits, no S3, no S4, no clicks, no rubs, no murmurs ABD:  Flat, positive bowel sounds normal in frequency in pitch, no  bruits, no rebound, no guarding, no midline pulsatile mass, no hepatomegaly, no splenomegaly EXT:  2 plus pulses throughout, mild left greater than right nonpitting lower extremity edema, no cyanosis no clubbing, right wrist normal     ASSESSMENT AND PLAN  ATRIAL FIB  Ms. Ananya Sayres has a CHA2DS2 - VASc score of 5 with a risk of stroke of 6.7%  and a HAS - BLED .  She's had no symptomatic paroxysms.  She will continue with warfarin.  CAD, NATIVE VESSEL  She had disease as above.  I will increase her Imdur for prophylaxis against further pain.  She will continue with risk reduction.   HYPERTENSION, UNSPECIFIED The blood pressure is at target. No change in medications is indicated. We will continue with therapeutic lifestyle changes (TLC).  HYPERLIPIDEMIA Lipids are as listed below. He'll continue on the meds as listed. Lab Results  Component Value Date   CHOL 137 08/06/2015   TRIG 105 08/06/2015   HDL 36* 08/06/2015   LDLCALC 80 08/06/2015    DYSPNEA She is no longer complaining of this.  DM I will defer to Evelina Dun, FNP Lab Results  Component Value Date   HGBA1C 7.5* 08/05/2015

## 2015-08-31 NOTE — Patient Instructions (Signed)
Anticoagulation Dose Instructions as of 08/31/2015      Rhonda Garcia Tue Wed Thu Fri Sat   New Dose 5 mg 7.5 mg 5 mg 5 mg 5 mg 5 mg 5 mg    Description        Continue current warfarin dose to 5mg  - take 1 tablet daily except take 1 and 1/2 tablets on mondays.     INR was 2.1 today

## 2015-08-31 NOTE — Patient Instructions (Addendum)
Medication Instructions:  Please increase your Imdur (Isosorbide) to 60 mg a day. Continue all other medications as listed.  Follow-Up: Follow up in 6 months with Dr. Percival Spanish.  You will receive a letter in the mail 2 months before you are due.  Please call us when you receive this letter to schedule your follow up appointment.  If you need a refill on your cardiac medications before your next appointment, please call your pharmacy.  Thank you for choosing [facility]!!

## 2015-09-17 ENCOUNTER — Other Ambulatory Visit: Payer: Self-pay | Admitting: Family

## 2015-10-05 ENCOUNTER — Ambulatory Visit (INDEPENDENT_AMBULATORY_CARE_PROVIDER_SITE_OTHER): Payer: Medicare Other | Admitting: Pharmacist

## 2015-10-05 ENCOUNTER — Encounter: Payer: Self-pay | Admitting: Pharmacist

## 2015-10-05 VITALS — BP 120/78 | HR 66

## 2015-10-05 DIAGNOSIS — Z7901 Long term (current) use of anticoagulants: Secondary | ICD-10-CM | POA: Diagnosis not present

## 2015-10-05 DIAGNOSIS — E039 Hypothyroidism, unspecified: Secondary | ICD-10-CM

## 2015-10-05 DIAGNOSIS — E119 Type 2 diabetes mellitus without complications: Secondary | ICD-10-CM

## 2015-10-05 DIAGNOSIS — I480 Paroxysmal atrial fibrillation: Secondary | ICD-10-CM

## 2015-10-05 LAB — COAGUCHEK XS/INR WAIVED
INR: 1.8 — ABNORMAL HIGH (ref 0.9–1.1)
Prothrombin Time: 21.6 s

## 2015-10-05 MED ORDER — LEVOTHYROXINE SODIUM 25 MCG PO TABS: 25.0000 ug | ORAL_TABLET | Freq: Every day | ORAL | Status: DC

## 2015-10-05 NOTE — Patient Instructions (Signed)
Anticoagulation Dose Instructions as of 10/05/2015      Rhonda Garcia Tue Wed Thu Fri Sat   New Dose 5 mg 7.5 mg 5 mg 5 mg 5 mg 5 mg 5 mg    Description        Take 1 and 1/2 tablets today - Wednesday, June 28th, then continue current warfarin dose to 5mg  - take 1 tablet daily except take 1 and 1/2 tablets on mondays.     INR was 1.8 today

## 2015-10-06 LAB — MICROALBUMIN / CREATININE URINE RATIO
Creatinine, Urine: 86.4 mg/dL
MICROALB/CREAT RATIO: 3.8 mg/g creat (ref 0.0–30.0)
Microalbumin, Urine: 3.3 ug/mL

## 2015-10-27 ENCOUNTER — Ambulatory Visit (INDEPENDENT_AMBULATORY_CARE_PROVIDER_SITE_OTHER): Payer: Medicare Other | Admitting: Family

## 2015-10-27 ENCOUNTER — Encounter: Payer: Self-pay | Admitting: Family

## 2015-10-27 VITALS — BP 127/68 | HR 64 | Temp 97.4°F | Ht 62.0 in | Wt 203.0 lb

## 2015-10-27 DIAGNOSIS — I25110 Atherosclerotic heart disease of native coronary artery with unstable angina pectoris: Secondary | ICD-10-CM | POA: Diagnosis not present

## 2015-10-27 DIAGNOSIS — E559 Vitamin D deficiency, unspecified: Secondary | ICD-10-CM

## 2015-10-27 DIAGNOSIS — I10 Essential (primary) hypertension: Secondary | ICD-10-CM | POA: Diagnosis not present

## 2015-10-27 DIAGNOSIS — E785 Hyperlipidemia, unspecified: Secondary | ICD-10-CM | POA: Diagnosis not present

## 2015-10-27 DIAGNOSIS — Z7901 Long term (current) use of anticoagulants: Secondary | ICD-10-CM | POA: Diagnosis not present

## 2015-10-27 DIAGNOSIS — K5900 Constipation, unspecified: Secondary | ICD-10-CM

## 2015-10-27 DIAGNOSIS — E039 Hypothyroidism, unspecified: Secondary | ICD-10-CM | POA: Diagnosis not present

## 2015-10-27 DIAGNOSIS — I480 Paroxysmal atrial fibrillation: Secondary | ICD-10-CM

## 2015-10-27 DIAGNOSIS — E119 Type 2 diabetes mellitus without complications: Secondary | ICD-10-CM | POA: Diagnosis not present

## 2015-10-27 DIAGNOSIS — F329 Major depressive disorder, single episode, unspecified: Secondary | ICD-10-CM

## 2015-10-27 DIAGNOSIS — F411 Generalized anxiety disorder: Secondary | ICD-10-CM

## 2015-10-27 DIAGNOSIS — K219 Gastro-esophageal reflux disease without esophagitis: Secondary | ICD-10-CM | POA: Diagnosis not present

## 2015-10-27 DIAGNOSIS — M1990 Unspecified osteoarthritis, unspecified site: Secondary | ICD-10-CM

## 2015-10-27 DIAGNOSIS — F32A Depression, unspecified: Secondary | ICD-10-CM

## 2015-10-27 LAB — COAGUCHEK XS/INR WAIVED
INR: 1.6 — ABNORMAL HIGH (ref 0.9–1.1)
Prothrombin Time: 19.6 s

## 2015-10-27 MED ORDER — ESCITALOPRAM OXALATE 10 MG PO TABS: 10.0000 mg | ORAL_TABLET | Freq: Every day | ORAL | Status: DC

## 2015-10-27 NOTE — Patient Instructions (Addendum)
Anticoagulation Dose Instructions as of 10/27/2015      Rhonda Garcia Tue Wed Thu Fri Sat   New Dose 5 mg 7.5 _0  mg    Description        Take 1 and 1/2 tablets for next 2 days - Thursday 6/20 and Friday 6/21.  Then restart usual warfarin dose of 74m - take 1 and 1/2 on mondays and 1 tablet all other days.     INR was 1.6 today  Health Maintenance, Female Adopting a healthy lifestyle and getting preventive care can go a long way to promote health and wellness. Talk with your health care provider about what schedule of regular examinations is right for you. This is a good chance for you to check in with your provider about disease prevention and staying healthy. In between checkups, there are plenty of things you can do on your own. Experts have done a lot of research about which lifestyle changes and preventive measures are most likely to keep you healthy. Ask your health care provider for more information. WEIGHT AND DIET  Eat a healthy diet  Be sure to include plenty of vegetables, fruits, low-fat dairy products, and lean protein.  Do not eat a lot of foods high in solid fats, added sugars, or salt.  Get regular exercise. This is one of the most important things you can do for your health.  Most adults should exercise for at least 150 minutes each week. The exercise should increase your heart rate and make you sweat (moderate-intensity exercise).  Most adults should also do strengthening exercises at least twice a week. This is in addition to the moderate-intensity exercise.  Maintain a healthy weight  Body mass index (BMI) is a measurement that can be used to identify possible weight problems. It estimates body fat based on height and weight. Your health care provider can help determine your BMI and help you achieve or maintain a healthy weight.  For females 261years of age and older:   A BMI below 18.5 is considered underweight.  A BMI of 18.5 to 24.9 is  normal.  A BMI of 25 to 29.9 is considered overweight.  A BMI of 30 and above is considered obese.  Watch levels of cholesterol and blood lipids  You should start having your blood tested for lipids and cholesterol at 74years of age, then have this test every 5 years.  You may need to have your cholesterol levels checked more often if:  Your lipid or cholesterol levels are high.  You are older than 74years of age.  You are at high risk for heart disease.  CANCER SCREENING   Lung Cancer  Lung cancer screening is recommended for adults [id number] old who are at high risk for lung cancer because of a history of smoking.  A yearly low-dose CT scan of the lungs is recommended for people who:  Currently smoke.  Have quit within the past 15 years.  Have at least a 30-pack-year history of smoking. A pack year is smoking an average of one pack of cigarettes a day for 1 year.  Yearly screening should continue until it has been 15 years since you quit.  Yearly screening should stop if you develop a health problem that would prevent you from having lung cancer treatment.  Breast Cancer  Practice breast self-awareness. This means understanding how your breasts normally appear and feel.  It also means  doing regular breast self-exams. Let your health care provider know about any changes, no matter how small.  If you are in your 20s or 30s, you should have a clinical breast exam (CBE) by a health care provider every 1-3 years as part of a regular health exam.  If you are 80 or older, have a CBE every year. Also consider having a breast X-ray (mammogram) every year.  If you have a family history of breast cancer, talk to your health care provider about genetic screening.  If you are at high risk for breast cancer, talk to your health care provider about having an MRI and a mammogram every year.  Breast cancer gene (BRCA) assessment is recommended for women who have family members  with BRCA-related cancers. BRCA-related cancers include:  Breast.  Ovarian.  Tubal.  Peritoneal cancers.  Results of the assessment will determine the need for genetic counseling and BRCA1 and BRCA2 testing. Cervical Cancer Your health care provider may recommend that you be screened regularly for cancer of the pelvic organs (ovaries, uterus, and vagina). This screening involves a pelvic examination, including checking for microscopic changes to the surface of your cervix (Pap test). You may be encouraged to have this screening done every 3 years, beginning at age 58.  For women ages 29-65, health care providers may recommend pelvic exams and Pap testing every 3 years, or they may recommend the Pap and pelvic exam, combined with testing for human papilloma virus (HPV), every 5 years. Some types of HPV increase your risk of cervical cancer. Testing for HPV may also be done on women of any age with unclear Pap test results.  Other health care providers may not recommend any screening for nonpregnant women who are considered low risk for pelvic cancer and who do not have symptoms. Ask your health care provider if a screening pelvic exam is right for you.  If you have had past treatment for cervical cancer or a condition that could lead to cancer, you need Pap tests and screening for cancer for at least 20 years after your treatment. If Pap tests have been discontinued, your risk factors (such as having a new sexual partner) need to be reassessed to determine if screening should resume. Some women have medical problems that increase the chance of getting cervical cancer. In these cases, your health care provider may recommend more frequent screening and Pap tests. Colorectal Cancer  This type of cancer can be detected and often prevented.  Routine colorectal cancer screening usually begins at 74 years of age and continues through 74 years of age.  Your health care provider may recommend  screening at an earlier age if you have risk factors for colon cancer.  Your health care provider may also recommend using home test kits to check for hidden blood in the stool.  A small camera at the end of a tube can be used to examine your colon directly (sigmoidoscopy or colonoscopy). This is done to check for the earliest forms of colorectal cancer.  Routine screening usually begins at age 61.  Direct examination of the colon should be repeated every 5-10 years through 74 years of age. However, you may need to be screened more often if early forms of precancerous polyps or small growths are found. Skin Cancer  Check your skin from head to toe regularly.  Tell your health care provider about any new moles or changes in moles, especially if there is a change in a mole's shape or  color.  Also tell your health care provider if you have a mole that is larger than the size of a pencil eraser.  Always use sunscreen. Apply sunscreen liberally and repeatedly throughout the day.  Protect yourself by wearing long sleeves, pants, a wide-brimmed hat, and sunglasses whenever you are outside. HEART DISEASE, DIABETES, AND HIGH BLOOD PRESSURE   High blood pressure causes heart disease and increases the risk of stroke. High blood pressure is more likely to develop in:  People who have blood pressure in the high end of the normal range (130-139/85-89 mm Hg).  People who are overweight or obese.  People who are African American.  If you are 33-67 years of age, have your blood pressure checked every 3-5 years. If you are 25 years of age or older, have your blood pressure checked every year. You should have your blood pressure measured twice--once when you are at a hospital or clinic, and once when you are not at a hospital or clinic. Record the average of the two measurements. To check your blood pressure when you are not at a hospital or clinic, you can use:  An automated blood pressure machine at a  pharmacy.  A home blood pressure monitor.  If you are between 60 years and 33 years old, ask your health care provider if you should take aspirin to prevent strokes.  Have regular diabetes screenings. This involves taking a blood sample to check your fasting blood sugar level.  If you are at a normal weight and have a low risk for diabetes, have this test once every three years after 74 years of age.  If you are overweight and have a high risk for diabetes, consider being tested at a younger age or more often. PREVENTING INFECTION  Hepatitis B  If you have a higher risk for hepatitis B, you should be screened for this virus. You are considered at high risk for hepatitis B if:  You were born in a country where hepatitis B is common. Ask your health care provider which countries are considered high risk.  Your parents were born in a high-risk country, and you have not been immunized against hepatitis B (hepatitis B vaccine).  You have HIV or AIDS.  You use needles to inject street drugs.  You live with someone who has hepatitis B.  You have had sex with someone who has hepatitis B.  You get hemodialysis treatment.  You take certain medicines for conditions, including cancer, organ transplantation, and autoimmune conditions. Hepatitis C  Blood testing is recommended for:  Everyone born from [birth year] through 1964.  Anyone with known risk factors for hepatitis C. Sexually transmitted infections (STIs)  You should be screened for sexually transmitted infections (STIs) including gonorrhea and chlamydia if:  You are sexually active and are younger than 74 years of age.  You are older than 74 years of age and your health care provider tells you that you are at risk for this type of infection.  Your sexual activity has changed since you were last screened and you are at an increased risk for chlamydia or gonorrhea. Ask your health care provider if you are at risk.  If you do not  have HIV, but are at risk, it may be recommended that you take a prescription medicine daily to prevent HIV infection. This is called pre-exposure prophylaxis (PrEP). You are considered at risk if:  You are sexually active and do not regularly use condoms or know the HIV status  of your partner(s).  You take drugs by injection.  You are sexually active with a partner who has HIV. Talk with your health care provider about whether you are at high risk of being infected with HIV. If you choose to begin PrEP, you should first be tested for HIV. You should then be tested every 3 months for as long as you are taking PrEP.  PREGNANCY   If you are premenopausal and you may become pregnant, ask your health care provider about preconception counseling.  If you may become pregnant, take 400 to 800 micrograms (mcg) of folic acid every day.  If you want to prevent pregnancy, talk to your health care provider about birth control (contraception). OSTEOPOROSIS AND MENOPAUSE   Osteoporosis is a disease in which the bones lose minerals and strength with aging. This can result in serious bone fractures. Your risk for osteoporosis can be identified using a bone density scan.  If you are 26 years of age or older, or if you are at risk for osteoporosis and fractures, ask your health care provider if you should be screened.  Ask your health care provider whether you should take a calcium or vitamin D supplement to lower your risk for osteoporosis.  Menopause may have certain physical symptoms and risks.  Hormone replacement therapy may reduce some of these symptoms and risks. Talk to your health care provider about whether hormone replacement therapy is right for you.  HOME CARE INSTRUCTIONS   Schedule regular health, dental, and eye exams.  Stay current with your immunizations.   Do not use any tobacco products including cigarettes, chewing tobacco, or electronic cigarettes.  If you are pregnant, do not  drink alcohol.  If you are breastfeeding, limit how much and how often you drink alcohol.  Limit alcohol intake to no more than 1 drink per day for nonpregnant women. One drink equals 12 ounces of beer, 5 ounces of wine, or 1 ounces of hard liquor.  Do not use street drugs.  Do not share needles.  Ask your health care provider for help if you need support or information about quitting drugs.  Tell your health care provider if you often feel depressed.  Tell your health care provider if you have ever been abused or do not feel safe at home.   This information is not intended to replace advice given to you by your health care provider. Make sure you discuss any questions you have with your health care provider.   Document Released: 10/09/2010 Document Revised: 04/16/2014 Document Reviewed: 02/25/2013 Elsevier Interactive Patient Education [employer].

## 2015-10-27 NOTE — Progress Notes (Addendum)
Subjective:    Patient ID: Rhonda Garcia, female    DOB: 1941/01/30, 75 y.o.   MRN: [id number]  Pt presents to the office today for chronic follow up.  Diabetes She presents for her follow-up diabetic visit. She has type 2 diabetes mellitus. Her disease course has been fluctuating. Hypoglycemia symptoms include nervousness/anxiousness. Pertinent negatives for hypoglycemia include no confusion, dizziness or headaches. Associated symptoms include visual change. Pertinent negatives for diabetes include no fatigue, no foot paresthesias, no foot ulcerations and no polyphagia. There are no hypoglycemic complications. Symptoms are stable. Diabetic complications include heart disease and nephropathy. Pertinent negatives for diabetic complications include no CVA or peripheral neuropathy. Risk factors for coronary artery disease include dyslipidemia, family history, hypertension, post-menopausal, stress and diabetes mellitus. Current diabetic treatment includes oral agent (dual therapy). She is compliant with treatment all of the time. She is following a generally unhealthy diet. She participates in exercise weekly. (PT states she does  Not take her blood sugars at home  ) An ACE inhibitor/angiotensin II receptor blocker is being taken. Eye exam is current.  Hypertension This is a chronic problem. The current episode started more than 1 year ago. The problem has been resolved since onset. The problem is controlled. Associated symptoms include anxiety and malaise/fatigue. Pertinent negatives include no headaches, palpitations, peripheral edema or shortness of breath. Risk factors for coronary artery disease include diabetes mellitus, dyslipidemia, family history, obesity and post-menopausal state. Past treatments include ACE inhibitors, diuretics, calcium channel blockers and beta blockers. The current treatment provides moderate improvement. Hypertensive end-organ damage includes CAD/MI. There is no history of  kidney disease, CVA, heart failure or a thyroid problem. Identifiable causes of hypertension include sleep apnea.  Hyperlipidemia This is a chronic problem. The current episode started more than 1 year ago. The problem is controlled. Recent lipid tests were reviewed and are normal. Exacerbating diseases include diabetes and obesity. She has no history of hypothyroidism. Pertinent negatives include no leg pain or shortness of breath. Current antihyperlipidemic treatment includes statins. The current treatment provides moderate improvement of lipids. Risk factors for coronary artery disease include diabetes mellitus, dyslipidemia, family history, hypertension, obesity, post-menopausal and a sedentary lifestyle.  Gastroesophageal Reflux She reports no belching, no heartburn or no nausea. This is a chronic problem. The current episode started more than 1 year ago. The problem occurs rarely. The problem has been resolved. The symptoms are aggravated by certain foods. Pertinent negatives include no fatigue. She has tried a PPI and a diet change for the symptoms. The treatment provided significant relief.  Anxiety Presents for follow-up visit. Onset was 1 to 6 months ago. The problem has been rapidly worsening. Symptoms include depressed mood, excessive worry, insomnia and nervous/anxious behavior. Patient reports no confusion, dizziness, irritability, nausea, palpitations or shortness of breath. Symptoms occur occasionally. The severity of symptoms is mild. The symptoms are aggravated by family issues and work stress.   Her past medical history is significant for anxiety/panic attacks and depression. There is no history of CAD. Past treatments include SSRIs.  Constipation This is a chronic problem. The current episode started more than 1 year ago. The problem is unchanged. Her stool frequency is 4 to 5 times per week. The stool is described as firm. Associated symptoms include bloating. Pertinent negatives  include no hematochezia or nausea. The treatment provided mild relief.  Thyroid Problem Presents for follow-up visit. Symptoms include anxiety, constipation, depressed mood and visual change. Patient reports no fatigue or palpitations. The symptoms have been  improving. Past treatments include levothyroxine. The treatment provided moderate relief. Her past medical history is significant for diabetes and hyperlipidemia. There is no history of heart failure.  Arthritis Presents for follow-up visit. She complains of pain and joint warmth. Affected location: bilateral hip. Her pain is at a severity of 6/10. Pertinent negatives include no fatigue. Her past medical history is significant for osteoarthritis. Her pertinent risk factors include overuse. Past treatments include rest, NSAIDs and acetaminophen. The treatment provided moderate relief.  A Fib :Pt currently taking warfarin 5 mg daily. Pt states this is stable.     Review of Systems  Constitutional: Positive for malaise/fatigue. Negative for irritability and fatigue.  HENT: Negative.   Eyes: Negative.   Respiratory: Negative.  Negative for shortness of breath.   Cardiovascular: Negative.  Negative for palpitations.  Gastrointestinal: Positive for constipation and bloating. Negative for heartburn, nausea and hematochezia.  Endocrine: Negative.  Negative for polyphagia.  Genitourinary: Negative.   Musculoskeletal: Positive for arthritis.  Neurological: Negative.  Negative for dizziness and headaches.  Hematological: Negative.   Psychiatric/Behavioral: Negative for confusion. The patient is nervous/anxious and has insomnia.   All other systems reviewed and are negative.      Objective:   Physical Exam  Constitutional: She is oriented to person, place, and time. She appears well-developed and well-nourished. No distress.  HENT:  Head: Normocephalic and atraumatic.  Right Ear: External ear normal.  Left Ear: External ear normal.  Nose:  Nose normal.  Mouth/Throat: Oropharynx is clear and moist.  Eyes: Pupils are equal, round, and reactive to light.  Neck: Normal range of motion. Neck supple. No thyromegaly present.  Cardiovascular: Normal rate, regular rhythm, normal heart sounds and intact distal pulses.   No murmur heard. Pulmonary/Chest: Effort normal and breath sounds normal. No respiratory distress. She has no wheezes.  Abdominal: Soft. Bowel sounds are normal. She exhibits no distension. There is no tenderness.  Musculoskeletal: Normal range of motion. She exhibits no edema or tenderness.  Neurological: She is alert and oriented to person, place, and time. She has normal reflexes. No cranial nerve deficit.  Skin: Skin is warm and dry.  Psychiatric: She has a normal mood and affect. Her behavior is normal. Judgment and thought content normal.  Vitals reviewed.     BP 127/68 mmHg  Pulse 64  Temp(Src) 97.4 F (36.3 C) (Oral)  Ht 5' 2"  (1.575 m)  Wt 203 lb (92.08 kg)  BMI 37.12 kg/m2     Assessment & Plan:  1. Atherosclerosis of native coronary artery of native heart with unstable angina pectoris ([facility]) - CMP14+EGFR - CoaguChek XS/INR Waived  2. Coronary artery disease involving native coronary artery of native heart with unstable angina pectoris ([facility]) - CMP14+EGFR - Lipid panel - CoaguChek XS/INR Waived  3. Essential hypertension - CMP14+EGFR  4. Paroxysmal atrial fibrillation (HCC) - CMP14+EGFR  5. Constipation, unspecified constipation type - CMP14+EGFR  6. Gastroesophageal reflux disease without esophagitis - CMP14+EGFR  7. Type 2 diabetes mellitus without complication, without long-term current use of insulin (HCC) - CMP14+EGFR  8. Hypothyroidism, unspecified hypothyroidism type - CMP14+EGFR - Thyroid Panel With TSH  9. Osteoarthritis, unspecified osteoarthritis type, unspecified site  - CMP14+EGFR  10. Depression -Pt's Zoloft stopped today and started on lexapro today -PT discussed  importance of taking every day and not as needed - escitalopram (LEXAPRO) 10 MG tablet; Take 1 tablet (10 mg total) by mouth daily.  Dispense: 90 tablet; Refill: 3 - CMP14+EGFR  11. Hyperlipidemia -Pravastatin stopped  today - CMP14+EGFR - Lipid panel  12. Vitamin D deficiency - CMP14+EGFR  13. Long term current use of anticoagulant therapy - CMP14+EGFR - CoaguChek XS/INR Waived  Anticoagulation Dose Instructions as of 10/27/2015      Dorene Grebe Tue Wed Thu Fri Sat   New Dose 5 mg 7.5 mg 5 mg 5 mg 5 mg 5 mg 5 mg    Description        Take 1 and 1/2 tablets for next 2 days - Thursday 6/20 and Friday 6/21.  Then restart usual warfarin dose of 52m - take 1 and 1/2 on mondays and 1 tablet all other days.       14. GAD (generalized anxiety disorder) - escitalopram (LEXAPRO) 10 MG tablet; Take 1 tablet (10 mg total) by mouth daily.  Dispense: 90 tablet; Refill: 3 - CMP14+EGFR   Continue all meds Labs pending Health Maintenance reviewed Diet and exercise encouraged RTO 3 month  CEvelina Dun FNP

## 2015-10-28 LAB — CMP14+EGFR
ALT: 18 IU/L (ref 0–32)
AST: 20 IU/L (ref 0–40)
Albumin/Globulin Ratio: 1.5 (ref 1.2–2.2)
Albumin: 4 g/dL (ref 3.5–4.8)
Alkaline Phosphatase: 87 IU/L (ref 39–117)
BUN/Creatinine Ratio: 16 (ref 12–28)
BUN: 13 mg/dL (ref 8–27)
Bilirubin Total: 0.5 mg/dL (ref 0.0–1.2)
CO2: 26 mmol/L (ref 18–29)
Calcium: 9.5 mg/dL (ref 8.7–10.3)
Chloride: 99 mmol/L (ref 96–106)
Creatinine, Ser: 0.81 mg/dL (ref 0.57–1.00)
GFR calc Af Amer: 83 mL/min/{1.73_m2} (ref >59)
GFR calc non Af Amer: 72 mL/min/{1.73_m2} (ref >59)
Globulin, Total: 2.7 g/dL (ref 1.5–4.5)
Glucose: 159 mg/dL — ABNORMAL HIGH (ref 65–99)
Potassium: 5 mmol/L (ref 3.5–5.2)
Sodium: 141 mmol/L (ref 134–144)
Total Protein: 6.7 g/dL (ref 6.0–8.5)

## 2015-10-28 LAB — THYROID PANEL WITH TSH
Free Thyroxine Index: 1.8 (ref 1.2–4.9)
T3 Uptake Ratio: 27 % (ref 24–39)
T4, Total: 6.7 ug/dL (ref 4.5–12.0)
TSH: 2.97 u[IU]/mL (ref 0.450–4.500)

## 2015-10-28 LAB — LIPID PANEL
Chol/HDL Ratio: 3.9 ratio units (ref 0.0–4.4)
Cholesterol, Total: 157 mg/dL (ref 100–199)
HDL: 40 mg/dL (ref >39)
LDL Calculated: 95 mg/dL (ref 0–99)
Triglycerides: 112 mg/dL (ref 0–149)
VLDL Cholesterol Cal: 22 mg/dL (ref 5–40)

## 2015-11-23 ENCOUNTER — Encounter: Payer: Self-pay | Admitting: Pharmacist

## 2015-11-30 ENCOUNTER — Encounter: Payer: Self-pay | Admitting: Pharmacist

## 2015-12-14 ENCOUNTER — Ambulatory Visit (INDEPENDENT_AMBULATORY_CARE_PROVIDER_SITE_OTHER): Payer: Medicare Other | Admitting: Pharmacist

## 2015-12-14 DIAGNOSIS — Z7901 Long term (current) use of anticoagulants: Secondary | ICD-10-CM

## 2015-12-14 DIAGNOSIS — I480 Paroxysmal atrial fibrillation: Secondary | ICD-10-CM | POA: Diagnosis not present

## 2015-12-14 LAB — COAGUCHEK XS/INR WAIVED
INR: 1.8 — ABNORMAL HIGH (ref 0.9–1.1)
Prothrombin Time: 21.6 s

## 2015-12-14 MED ORDER — WARFARIN SODIUM 5 MG PO TABS: 5.0000 mg | ORAL_TABLET | Freq: Every day | ORAL | 1 refills | Status: DC

## 2016-01-10 ENCOUNTER — Ambulatory Visit (INDEPENDENT_AMBULATORY_CARE_PROVIDER_SITE_OTHER): Payer: Medicare Other | Admitting: Family

## 2016-01-10 ENCOUNTER — Encounter: Payer: Self-pay | Admitting: Family

## 2016-01-10 VITALS — Temp 97.1°F | Ht 62.0 in | Wt 203.4 lb

## 2016-01-10 DIAGNOSIS — Z1211 Encounter for screening for malignant neoplasm of colon: Secondary | ICD-10-CM

## 2016-01-10 DIAGNOSIS — I480 Paroxysmal atrial fibrillation: Secondary | ICD-10-CM

## 2016-01-10 DIAGNOSIS — M1990 Unspecified osteoarthritis, unspecified site: Secondary | ICD-10-CM | POA: Diagnosis not present

## 2016-01-10 DIAGNOSIS — Z780 Asymptomatic menopausal state: Secondary | ICD-10-CM | POA: Diagnosis not present

## 2016-01-10 DIAGNOSIS — L989 Disorder of the skin and subcutaneous tissue, unspecified: Secondary | ICD-10-CM

## 2016-01-10 DIAGNOSIS — Z23 Encounter for immunization: Secondary | ICD-10-CM

## 2016-01-10 DIAGNOSIS — Z7901 Long term (current) use of anticoagulants: Secondary | ICD-10-CM

## 2016-01-10 LAB — COAGUCHEK XS/INR WAIVED
INR: 2.5 — ABNORMAL HIGH (ref 0.9–1.1)
Prothrombin Time: 30.1 s

## 2016-01-10 MED ORDER — TRAMADOL HCL 50 MG PO TABS: 50.0000 mg | ORAL_TABLET | Freq: Two times a day (BID) | ORAL | 2 refills | Status: DC | PRN

## 2016-01-10 NOTE — Patient Instructions (Signed)

## 2016-01-10 NOTE — Progress Notes (Signed)
Subjective:    Patient ID: Rhonda Garcia, female    DOB: 20-Mar-1941, 75 y.o.   MRN: [id number]  Pt presents to the office today with bilateral hip and leg pain. PT states this pain started a few months ago,but over the last three weeks has become worse. Pt sates she has pain 8 out 10.   Pt states she has had some changes in her stool color over the last few months from changing to green or yellow on different days. Pt states this is not related to her food. Pt states she worries about this because she is suppose to have colonoscopy every year and is overdue. Pt would like to schedule this.   Pt would also like a referral to dermatologists today for several moles to be removed. Pt states she has had the moles for years, but seem to be "growing".  Leg Pain   The incident occurred more than 1 week ago. There was no injury mechanism. The pain is present in the left leg, right leg, right hip and left hip. The quality of the pain is described as aching. The pain is at a severity of 8/10. The pain is moderate. The pain has been intermittent since onset. She reports no foreign bodies present. The symptoms are aggravated by movement and weight bearing. She has tried acetaminophen, rest and NSAIDs for the symptoms. The treatment provided mild relief.  A Fib PT currently on warfarin daily. This is stable. See anticoagulation flow sheet.     Review of Systems  All other systems reviewed and are negative.      Objective:   Physical Exam  Constitutional: She is oriented to person, place, and time. She appears well-developed and well-nourished. No distress.  HENT:  Head: Normocephalic.  Eyes: Pupils are equal, round, and reactive to light.  Neck: Normal range of motion. Neck supple. No thyromegaly present.  Cardiovascular: Normal rate, regular rhythm, normal heart sounds and intact distal pulses.   No murmur heard. Pulmonary/Chest: Effort normal and breath sounds normal. No respiratory distress. She  has no wheezes.  Abdominal: Soft. Bowel sounds are normal. She exhibits no distension. There is no tenderness.  Musculoskeletal: Normal range of motion. She exhibits no edema or tenderness.  Neurological: She is alert and oriented to person, place, and time.  Skin: Skin is warm and dry.  Psychiatric: She has a normal mood and affect. Her behavior is normal. Judgment and thought content normal.  Vitals reviewed.   Temp 97.1 F (36.2 C) (Oral)   Ht 5\' 2"  (1.575 m)   Wt 203 lb 6.4 oz (92.3 kg)   BMI 37.20 kg/m      Assessment & Plan:  1. Osteoarthritis, unspecified osteoarthritis type, unspecified site -Rest -Ice -ROM exercises, encouraged patient to continue dancing and to only take ultram as needed - traMADol (ULTRAM) 50 MG tablet; Take 1 tablet (50 mg total) by mouth every 12 (twelve) hours as needed.  Dispense: 60 tablet; Refill: 2  2. Long term current use of anticoagulant therapy  3. Paroxysmal atrial fibrillation (HCC) - Anticoagulation Dose Instructions as of 01/10/2016      Dorene Grebe Tue Wed Thu Fri Sat   New Dose 7.5 mg 5 mg 7.5 mg 5 mg 7.5 mg 5 mg 7.5 mg    Description   Take 1 and 1/2 tablet today - Wednesday, September 6th.  Then start new dose of 1 tablet Mondays, Wednesdays and Fridays.  Take 1 and 1/2 tablets all other days.     -  CoaguChek XS/INR Waived  4. Colon cancer screening - Ambulatory referral to Gastroenterology  6. Skin lesion -Do not pick at  - Ambulatory referral to Dermatology  Evelina Dun, FNP

## 2016-01-24 DIAGNOSIS — W228XXA Striking against or struck by other objects, initial encounter: Secondary | ICD-10-CM | POA: Diagnosis not present

## 2016-01-24 DIAGNOSIS — F419 Anxiety disorder, unspecified: Secondary | ICD-10-CM | POA: Diagnosis not present

## 2016-01-24 DIAGNOSIS — Z79899 Other long term (current) drug therapy: Secondary | ICD-10-CM | POA: Diagnosis not present

## 2016-01-24 DIAGNOSIS — E7800 Pure hypercholesterolemia, unspecified: Secondary | ICD-10-CM | POA: Diagnosis not present

## 2016-01-24 DIAGNOSIS — E119 Type 2 diabetes mellitus without complications: Secondary | ICD-10-CM | POA: Diagnosis not present

## 2016-01-24 DIAGNOSIS — Z7901 Long term (current) use of anticoagulants: Secondary | ICD-10-CM | POA: Diagnosis not present

## 2016-01-24 DIAGNOSIS — I2510 Atherosclerotic heart disease of native coronary artery without angina pectoris: Secondary | ICD-10-CM | POA: Diagnosis not present

## 2016-01-24 DIAGNOSIS — I252 Old myocardial infarction: Secondary | ICD-10-CM | POA: Diagnosis not present

## 2016-01-24 DIAGNOSIS — F329 Major depressive disorder, single episode, unspecified: Secondary | ICD-10-CM | POA: Diagnosis not present

## 2016-01-24 DIAGNOSIS — S81831A Puncture wound without foreign body, right lower leg, initial encounter: Secondary | ICD-10-CM | POA: Diagnosis not present

## 2016-01-24 DIAGNOSIS — I10 Essential (primary) hypertension: Secondary | ICD-10-CM | POA: Diagnosis not present

## 2016-01-25 ENCOUNTER — Telehealth: Payer: Self-pay | Admitting: Pharmacist

## 2016-01-25 ENCOUNTER — Telehealth: Payer: Self-pay | Admitting: Family

## 2016-01-25 MED ORDER — WARFARIN SODIUM 5 MG PO TABS: ORAL_TABLET | ORAL | 1 refills | Status: DC

## 2016-01-25 NOTE — Telephone Encounter (Signed)
Per patient INR was 1.8 last night at the ER.  She took 1/2 tablet of warfarin when she got home last night.  She has been taking warfarin 5mg  - 1 tablet every day, although our records show she should be taking warfarin 5mg  1 tablet on MWF and 7.5mg  all other days.  INR was sub therapeutic 1 day ago.  Recommend increase dose to 1 and 1/2 tablets on Wednesdays.  Take 1 tablet all other days.

## 2016-01-25 NOTE — Telephone Encounter (Signed)
Opened in error

## 2016-02-15 ENCOUNTER — Encounter: Payer: Self-pay | Admitting: Pharmacist

## 2016-02-16 ENCOUNTER — Ambulatory Visit (INDEPENDENT_AMBULATORY_CARE_PROVIDER_SITE_OTHER): Payer: Medicare Other | Admitting: Pharmacist

## 2016-02-16 DIAGNOSIS — I480 Paroxysmal atrial fibrillation: Secondary | ICD-10-CM

## 2016-02-16 DIAGNOSIS — Z7901 Long term (current) use of anticoagulants: Secondary | ICD-10-CM | POA: Diagnosis not present

## 2016-02-16 LAB — COAGUCHEK XS/INR WAIVED
INR: 2.2 — ABNORMAL HIGH (ref 0.9–1.1)
Prothrombin Time: 26.7 s

## 2016-02-16 MED ORDER — RANITIDINE HCL 150 MG PO TABS: 150.0000 mg | ORAL_TABLET | Freq: Two times a day (BID) | ORAL | 1 refills | Status: DC

## 2016-02-22 ENCOUNTER — Telehealth: Payer: Self-pay

## 2016-02-22 ENCOUNTER — Ambulatory Visit (INDEPENDENT_AMBULATORY_CARE_PROVIDER_SITE_OTHER): Payer: Medicare Other | Admitting: Nurse Practitioner

## 2016-02-22 ENCOUNTER — Other Ambulatory Visit: Payer: Self-pay

## 2016-02-22 ENCOUNTER — Encounter: Payer: Self-pay | Admitting: Nurse Practitioner

## 2016-02-22 VITALS — BP 127/77 | HR 74 | Temp 97.8°F | Ht 62.0 in | Wt 195.2 lb

## 2016-02-22 DIAGNOSIS — K219 Gastro-esophageal reflux disease without esophagitis: Secondary | ICD-10-CM

## 2016-02-22 DIAGNOSIS — R1314 Dysphagia, pharyngoesophageal phase: Secondary | ICD-10-CM

## 2016-02-22 DIAGNOSIS — K5900 Constipation, unspecified: Secondary | ICD-10-CM

## 2016-02-22 DIAGNOSIS — Z86010 Personal history of colonic polyps: Secondary | ICD-10-CM

## 2016-02-22 DIAGNOSIS — Z1211 Encounter for screening for malignant neoplasm of colon: Secondary | ICD-10-CM

## 2016-02-22 MED ORDER — PEG-KCL-NACL-NASULF-NA ASC-C 100 G PO SOLR: 1.0000 | ORAL | 0 refills | Status: DC

## 2016-02-22 MED ORDER — PEG 3350-KCL-NA BICARB-NACL 420 G PO SOLR: 4000.0000 mL | ORAL | 0 refills | Status: DC

## 2016-02-22 NOTE — Patient Instructions (Signed)
1. We will schedule your procedures for you. 2. You should be contacted I [name] at your primary care's office to discuss your Coumadin and Lovenox anticoagulation prior to your procedure. 3. Add daily Colace over-the-counter stool softener to help with constipation. 4. Return for follow-up as needed or based on recommendations made after your procedure. 5. Call us with any worsening or returning symptoms.

## 2016-02-22 NOTE — Progress Notes (Signed)
Referring Provider: Sharion Balloon, FNP Primary Care Physician:  Evelina Dun, FNP Primary GI:  Dr. Gala Romney  Chief Complaint  Patient presents with  . Colonoscopy  . Dysphagia    HPI:   Rhonda Garcia is a 75 y.o. female who presents to schedule colonoscopy. She was last seen in our office 06/22/2015 for noted history of adenomatous colon polyps and due for surveillance colonoscopy. The patient is on Coumadin and will likely require Lovenox bridge with Cherre Robins, PharmD at primary care office to assist. Planned on propofol/MAC due to polypharmacy. Also recommended EGD with possible dilation due to history of dysphagia and worsening symptoms at time of last visit. He was started on Linzess due to constipation. Schedule procedure was canceled because primary care recommended against that at that time and the patient was undergoing stress test by cardiology.   She did call back and note that Linzess is working and requested a prescription sent to the pharmacy, which was done.  Hospitalized for chest pain, Left hear cath with no culprit lesion idenifed, preserved LV function, triple vessel CAD. Deemed possible AFib RVR with demand ischemia. She was discharged home shortly after.  Today she states she was told she had a "very small heart attack" around the time of her last visit which is why PCP did not want procedure done. She is due for 1 year repeat colonoscopy, PCP has cleared her for this. She is on chronic anticoagulation for AFib. Has some abdominal soreness in lower abdomen which improves with a bowel movement. Had recent bout with constipation requiring a lot of straining. Other than this one incident Linzess works well for her. Denies hematochezia, melena. Has had a subjective weight loss of 5 lbs in 2 weeks which she attributes to poor appetite with constipation.   Is also having solid food dysphagia and pill dysphagia, getting worse recently. Particularly worse with cornbread. Also  with odynophagia with swallowing. Also has chronic GERD with occasional reflux breakthrough. Denies fever, chills. Denies chest pain, dyspnea, dizziness, lightheadedness, syncope, near syncope. Denies any other upper or lower GI symptoms.   Past Medical History:  Diagnosis Date  . A-fib ([place])   . Anal fissure    resolved   . Anemia   . Back pain    with left radiculopathy  . CAD (coronary artery disease)   . CAD (coronary artery disease)   . DDD (degenerative disc disease)   . Depression   . Diabetes mellitus   . Dyslipidemia   . Generalized headaches   . GERD (gastroesophageal reflux disease)   . Hearing loss   . Hiatal hernia   . Hx of peptic ulcer   . Hyperlipidemia   . Hypertension   . NSTEMI (non-ST elevated myocardial infarction) ([place]) 08/05/2015  . Osteopenia   . Sebaceous cyst    on back  . Sleep apnea    not using CPAP now, could not tolerate and PCP aware.  . Thyroid disease     Past Surgical History:  Procedure Laterality Date  . ABDOMINAL HYSTERECTOMY  2001 - approximate  . APPENDECTOMY    . BREAST REDUCTION SURGERY    . CARDIAC CATHETERIZATION N/A 08/08/2015   Procedure: Left Heart Cath and Coronary Angiography;  Surgeon: Burnell Blanks, MD;  Location: [facility];  Service: Cardiovascular;  Laterality: N/A;  . CHOLECYSTECTOMY  2008 - approximate  . COLONOSCOPY  06/2007   Friable anal canal and anal papillae. Pancolonic diverticula. Normal terminal ileum.  Status post segmental biopsy, descending colon biopsy showed minimal cryptitis. Biopsies felt to be  nonspecific but could be seen with NSAIDs. No features of inflammatory bowel disease. Stool studies were negative.  . COLONOSCOPY  03/21/2011   RMR: colonic polyps treated as described above, colonic diverticulosis (Tubular adenomas)  . COLONOSCOPY WITH PROPOFOL N/A 06/17/2014   RMR: Colonic diverticulosis. Multiple colonic polyps removed as described above.   . CORONARY ANGIOPLASTY WITH STENT  PLACEMENT     4 stents  . ESOPHAGEAL DILATION N/A 06/17/2014   Procedure: ESOPHAGEAL DILATION;  Surgeon: Daneil Dolin, MD;  Location: [facility];  Service: Endoscopy;  Laterality: N/A;  Maloney 35 Fr  . ESOPHAGOGASTRODUODENOSCOPY  06/2007   Small hiatal hernia  . ESOPHAGOGASTRODUODENOSCOPY  03/21/2011   RMR: normal esophagus- status post passage of Maloney dilator. Small hiatal hernia. Trivial antral and bulbar erosions  . ESOPHAGOGASTRODUODENOSCOPY (EGD) WITH PROPOFOL N/A 06/17/2014   RMR: Small hiatal hernia; otherwise normal EGD. Status post passage of a Maloney dilator. No explanation for patients right upper quadrant abdominal pain.   Marland Kitchen KNEE SURGERY Left    arthroscopy  . LIPOMA EXCISION  03/17/2012   Procedure: EXCISION LIPOMA;  Surgeon: Gwenyth Ober, MD;  Location: [facility];  Service: General;  Laterality: Right;  excision of right lower back lipoma  . POLYPECTOMY  06/17/2014   Procedure: POLYPECTOMY;  Surgeon: Daneil Dolin, MD;  Location: [facility];  Service: Endoscopy;;    Current Outpatient Prescriptions  Medication Sig Dispense Refill  . ALPRAZolam (XANAX) 0.5 MG tablet Take 0.5 mg by mouth at bedtime as needed for anxiety.    Marland Kitchen amLODipine (NORVASC) 10 MG tablet Take 1 tablet (10 mg total) by mouth daily. 90 tablet 3  . aspirin EC 81 MG EC tablet Take 1 tablet (81 mg total) by mouth daily.    Marland Kitchen escitalopram (LEXAPRO) 10 MG tablet Take 1 tablet (10 mg total) by mouth daily. 90 tablet 3  . isosorbide mononitrate (IMDUR) 60 MG 24 hr tablet Take 1 tablet (60 mg total) by mouth daily. 90 tablet 3  . levothyroxine (LEVOTHROID) 25 MCG tablet Take 1 tablet (25 mcg total) by mouth daily before breakfast. 90 tablet 0  . Linaclotide (LINZESS) 145 MCG CAPS capsule Take 1 capsule (145 mcg total) by mouth daily. 90 capsule 3  . lisinopril-hydrochlorothiazide (PRINZIDE,ZESTORETIC) 20-12.5 MG per tablet Take 2 tablets by mouth daily. 180 tablet 6  . metFORMIN (GLUCOPHAGE) 500 MG  tablet TAKE TWO TABLETS BY MOUTH EVERY MORNING (Patient taking differently: take 1 tablet bid) 180 tablet 2  . metoprolol (LOPRESSOR) 50 MG tablet TAKE ONE TABLET BY MOUTH TWICE DAILY 60 tablet 5  . nitroGLYCERIN (NITROSTAT) 0.4 MG SL tablet Place 0.4 mg under the tongue every 5 (five) minutes as needed for chest pain.     . ranitidine (ZANTAC) 150 MG tablet Take 1 tablet (150 mg total) by mouth 2 (two) times daily. 180 tablet 1  . sertraline (ZOLOFT) 100 MG tablet   0  . spironolactone (ALDACTONE) 25 MG tablet Take 1 tablet (25 mg total) by mouth daily. 30 tablet 6  . traMADol (ULTRAM) 50 MG tablet Take 1 tablet (50 mg total) by mouth every 12 (twelve) hours as needed. 60 tablet 2  . warfarin (COUMADIN) 5 MG tablet Take 1 and 1/2 tablets on Wednesdays.  Take 1 tablet all other days. 45 tablet 1   No current facility-administered medications for this visit.     Allergies as  of 02/22/2016 - Review Complete 02/22/2016  Allergen Reaction Noted  . Promethazine hcl Anaphylaxis 09/06/2010    Family History  Problem Relation Age of Onset  . Heart attack Mother   . Hypertension Mother   . Stroke Mother     Deceased, age 25  . Diabetes Sister   . Diabetes Sister   . Cancer Sister     ovarian  . Stroke Sister   . Diabetes Brother     also has a blood disorder   . Clotting disorder Brother   . Melanoma Brother     deceased  . Coronary artery disease Other   . Kidney disease Other   . Colon cancer Neg Hx     Social History   Social History  . Marital status: Widowed    Spouse name: N/A  . Number of children: 3  . Years of education: N/A   Occupational History  . Retired    Social History Main Topics  . Smoking status: Former Smoker    Packs/day: 0.50    Years: 5.00    Types: Cigarettes    Quit date: 03/13/1981  . Smokeless tobacco: Never Used     Comment: quit 30 +years ago  . Alcohol use No  . Drug use: No  . Sexual activity: Yes   Other Topics Concern  . None    Social History Narrative   Married    Review of Systems: General: Negative for anorexia, fever, chills, fatigue, weakness. Eyes: Negative for vision changes.  ENT: Negative for hoarseness. Admits difficulty swallowing. CV: Negative for chest pain, angina, palpitations, peripheral edema.  Respiratory: Negative for dyspnea at rest, cough, sputum, wheezing.  GI: See history of present illness. Endo: Negative for unusual weight change.  Heme: Negative for bruising or bleeding. Allergy: Negative for rash or hives.   Physical Exam: BP 127/77   Pulse 74   Temp 97.8 F (36.6 C) (Oral)   Ht 5\' 2"  (1.575 m)   Wt 195 lb 3.2 oz (88.5 kg)   BMI 35.70 kg/m  General:   Alert and oriented. Pleasant and cooperative. Well-nourished and well-developed.  Head:  Normocephalic and atraumatic. Eyes:  Without icterus, sclera clear and conjunctiva pink.  Ears:  Normal auditory acuity. Cardiovascular:  S1, S2 present without murmurs appreciated. Extremities without clubbing or edema. Respiratory:  Clear to auscultation bilaterally. No wheezes, rales, or rhonchi. No distress.  Gastrointestinal:  +BS, soft, and non-distended. Mild lower abdominal TTP. No HSM noted. No guarding or rebound. No masses appreciated.  Rectal:  Deferred  Musculoskalatal:  Symmetrical without gross deformities. Neurologic:  Alert and oriented x4;  grossly normal neurologically. Psych:  Alert and cooperative. Normal mood and affect. Heme/Lymph/Immune: No excessive bruising noted.    02/22/2016 10:54 AM   Disclaimer: This note was dictated with voice recognition software. Similar sounding words can inadvertently be transcribed and may not be corrected upon review.

## 2016-02-22 NOTE — Assessment & Plan Note (Signed)
The patient has a history of chronic GERD with occasional breakthrough symptoms. Recommend continue PPI, upper endoscopy as noted below. Return for follow-up as needed or per postprocedure recommendations.

## 2016-02-22 NOTE — Telephone Encounter (Signed)
[name], Ms.Coram was seen in our office today to schedule a colonoscopy. We need to know if she can hold her coumadin for her procedure and does she need to be bridged? She is scheduled for 03/19/16 for her procedure.

## 2016-02-22 NOTE — Assessment & Plan Note (Signed)
She has constipation and does relatively well on Linzess, however occasional breakthrough constipation symptoms. I will have her start a daily Colace stool softener in addition to her Linzess. Return for follow-up as needed.

## 2016-02-22 NOTE — Progress Notes (Signed)
CC'ED TO PCP 

## 2016-02-22 NOTE — Assessment & Plan Note (Signed)
The patient has a history of adenomatous colon polyps and is currently due for a recommended 1 year repeat colonoscopy. This had to be put on hold temporarily due to chest pain and stress test workup with a subsequent admission and left heart cath. No culprit lesion found for ischemia, noted three-vessel coronary artery disease. Cardiology queried A. fib with RVR resulting in the band ischemia. She continues on her chronic cardiology medications and primary care has cleared her for procedure. We will proceed with the repeat colonoscopy as previously recommended.  Proceed with TCS on propofol/MAC with Dr. Gala Romney in near future: the risks, benefits, and alternatives have been discussed with the patient in detail. The patient states understanding and desires to proceed.  The patient is presently on Coumadin and previously has been placed on a Lovenox bridge handled by pharmacist at her primary care office. We will recheck out to them for further instructions related to holding Coumadin/Lovenox bridge with her chronic A. Fib. The patient is currently on Xanax, Lexapro, Zoloft, and Ultram. The patient is not on any other anticoagulants, anxiolytics, chronic pain medications, or antidepressants. I will plan for the procedure on propofol/MAC to promote adequate sedation.

## 2016-02-22 NOTE — Assessment & Plan Note (Addendum)
Noted dysphagia, has a history of dilation in the past which improved her symptoms. Her symptoms have begun becoming worse. Typically worse with cornbread. Also with other solid foods and pills. She recently had a choking episode on cornbread which cause some 30 irritation. In addition to her dysphagia she is having odynophagia especially with large pills and cornbread. She is due for colonoscopy as noted below, we will add upper endoscopy with possible dilation as previously planned prior to her procedures being put on hold due to chest pain workup. Return for follow-up as needed or based on postprocedure recommendations.  Proceed with EGD +/- dilation with Dr. Gala Romney in near future: the risks, benefits, and alternatives have been discussed with the patient in detail. The patient states understanding and desires to proceed.  The patient is presently on Coumadin and previously has been placed on a Lovenox bridge handled by pharmacist at her primary care office. We will recheck out to them for further instructions related to holding Coumadin/Lovenox bridge with her chronic A. Fib. The patient is currently on Xanax, Lexapro, Zoloft, and Ultram. No other anticoagulants, anxiolytics, chronic pain medications, or antidepressants. We'll plan for the procedure on propofol/MAC to promote adequate sedation.

## 2016-02-22 NOTE — Telephone Encounter (Signed)
Called and informed pt of pre-op appt 03/14/16 at 10:00 am.

## 2016-02-24 DIAGNOSIS — I2510 Atherosclerotic heart disease of native coronary artery without angina pectoris: Secondary | ICD-10-CM | POA: Diagnosis not present

## 2016-02-24 DIAGNOSIS — K5792 Diverticulitis of intestine, part unspecified, without perforation or abscess without bleeding: Secondary | ICD-10-CM | POA: Diagnosis not present

## 2016-02-24 DIAGNOSIS — I10 Essential (primary) hypertension: Secondary | ICD-10-CM | POA: Diagnosis not present

## 2016-02-24 DIAGNOSIS — Z90710 Acquired absence of both cervix and uterus: Secondary | ICD-10-CM | POA: Diagnosis not present

## 2016-02-24 DIAGNOSIS — Z955 Presence of coronary angioplasty implant and graft: Secondary | ICD-10-CM | POA: Diagnosis not present

## 2016-02-24 DIAGNOSIS — K219 Gastro-esophageal reflux disease without esophagitis: Secondary | ICD-10-CM | POA: Diagnosis not present

## 2016-02-24 DIAGNOSIS — I4891 Unspecified atrial fibrillation: Secondary | ICD-10-CM | POA: Diagnosis not present

## 2016-02-24 DIAGNOSIS — I252 Old myocardial infarction: Secondary | ICD-10-CM | POA: Diagnosis not present

## 2016-02-24 DIAGNOSIS — R1032 Left lower quadrant pain: Secondary | ICD-10-CM | POA: Diagnosis not present

## 2016-02-24 DIAGNOSIS — E119 Type 2 diabetes mellitus without complications: Secondary | ICD-10-CM | POA: Diagnosis not present

## 2016-02-24 DIAGNOSIS — E785 Hyperlipidemia, unspecified: Secondary | ICD-10-CM | POA: Diagnosis not present

## 2016-02-24 DIAGNOSIS — K5732 Diverticulitis of large intestine without perforation or abscess without bleeding: Secondary | ICD-10-CM | POA: Diagnosis not present

## 2016-02-24 NOTE — Telephone Encounter (Signed)
[name] is taking warfarin for atrial fibrillation.  She can hold warfarin start 5 days (03/14/2016)prior to procedure.  She also has ASA and naproxen on her medication list - recommend stopping both ASA and naproxen 7 days (03/12/2016) prior to procedure.

## 2016-02-25 DIAGNOSIS — K5792 Diverticulitis of intestine, part unspecified, without perforation or abscess without bleeding: Secondary | ICD-10-CM | POA: Diagnosis not present

## 2016-02-25 DIAGNOSIS — E119 Type 2 diabetes mellitus without complications: Secondary | ICD-10-CM | POA: Diagnosis not present

## 2016-02-27 ENCOUNTER — Telehealth: Payer: Self-pay | Admitting: Pharmacist

## 2016-02-27 NOTE — Telephone Encounter (Signed)
Called and informed pt to hold Coumadin starting 03/14/16, hold ASA and Naproxen starting 03/12/16.

## 2016-02-27 NOTE — Telephone Encounter (Signed)
Noted, thanks for recommendations. Please schedule patient based on these recommendations from Cherre Robins, PharmD

## 2016-02-27 NOTE — Telephone Encounter (Signed)
[name] is taking warfarin for atrial fibrillation.  She is scheduled for colonoscopy 03-19-2016. Was contacted by GI about holding warfarin prior. She can hold warfarin start 5 days (03/14/2016) prior to procedure.  She also has ASA and naproxen on her medication list - recommend stopping both ASA and naproxen 7 days (03/12/2016) prior to procedure. Patient was notified by phone of above recommendations.

## 2016-02-27 NOTE — Telephone Encounter (Signed)
Thank you [name]. Routing to [employer] and clinical pool.

## 2016-03-05 ENCOUNTER — Ambulatory Visit (INDEPENDENT_AMBULATORY_CARE_PROVIDER_SITE_OTHER): Payer: Medicare Other | Admitting: Pharmacist

## 2016-03-05 DIAGNOSIS — I480 Paroxysmal atrial fibrillation: Secondary | ICD-10-CM

## 2016-03-05 NOTE — Progress Notes (Signed)
Patient ID: Rhonda Garcia, female   DOB: 07-21-1940, 75 y.o.   MRN: [id number]   erroneous encounter - Lovenox not needed

## 2016-03-13 NOTE — Patient Instructions (Signed)
[facility]  03/13/2016     @PREFPERIOPPHARMACY @   Your procedure is scheduled on  03/19/2016  Report to [facility] at  73   A.M.  Call this number if you have problems the morning of surgery:  [phone]   Remember:  Do not eat food or drink liquids after midnight.  Take these medicines the morning of surgery with A SIP OF WATER  Xanax, norvasc, lexapro, imdur, levothyroxine, lisinopril, metoprolol, zantac, zoloft, ultram.   Do not wear jewelry, make-up or nail polish.  Do not wear lotions, powders, or perfumes, or deoderant.  Do not shave 48 hours prior to surgery.  Men may shave face and neck.  Do not bring valuables to the hospital.  [facility] is not responsible for any belongings or valuables.  Contacts, dentures or bridgework may not be worn into surgery.  Leave your suitcase in the car.  After surgery it may be brought to your room.  For patients admitted to the hospital, discharge time will be determined by your treatment team.  Patients discharged the day of surgery will not be allowed to drive home.   Name and phone number of your driver:   family Special instructions:  Follow the diet and prep instructions given to you by Dr Roseanne Kaufman office.  Please read over the following fact sheets that you were given. Anesthesia Post-op Instructions and Care and Recovery After Surgery       Esophagogastroduodenoscopy Introduction Esophagogastroduodenoscopy (EGD) is a procedure to examine the lining of the esophagus, stomach, and first part of the small intestine (duodenum). This procedure is done to check for problems such as inflammation, bleeding, ulcers, or growths. During this procedure, a long, flexible, lighted tube with a camera attached (endoscope) is inserted down the throat. Tell a health care provider about:  Any allergies you have.  All medicines you are taking, including vitamins, herbs, eye drops, creams, and  over-the-counter medicines.  Any problems you or family members have had with anesthetic medicines.  Any blood disorders you have.  Any surgeries you have had.  Any medical conditions you have.  Whether you are pregnant or may be pregnant. What are the risks? Generally, this is a safe procedure. However, problems may occur, including:  Infection.  Bleeding.  A tear (perforation) in the esophagus, stomach, or duodenum.  Trouble breathing.  Excessive sweating.  Spasms of the larynx.  A slowed heartbeat.  Low blood pressure. What happens before the procedure?  Follow instructions from your health care provider about eating or drinking restrictions.  Ask your health care provider about:  Changing or stopping your regular medicines. This is especially important if you are taking diabetes medicines or blood thinners.  Taking medicines such as aspirin and ibuprofen. These medicines can thin your blood. Do not take these medicines before your procedure if your health care provider instructs you not to.  Plan to have someone take you home after the procedure.  If you wear dentures, be ready to remove them before the procedure. What happens during the procedure?  To reduce your risk of infection, your health care team will wash or sanitize their hands.  An IV tube will be put in a vein in your hand or arm. You will get medicines and fluids through this tube.  You will be given one or more of the following:  A medicine to help you relax (sedative).  A medicine to numb the area (local anesthetic). This medicine may be sprayed into your throat. It will make you feel more comfortable and keep you from gagging or coughing during the procedure.  A medicine for pain.  A mouth guard may be placed in your mouth to protect your teeth and to keep you from biting on the endoscope.  You will be asked to lie on your left side.  The endoscope will be lowered down your throat into  your esophagus, stomach, and duodenum.  Air will be put into the endoscope. This will help your health care provider see better.  The lining of your esophagus, stomach, and duodenum will be examined.  Your health care provider may:  Take a tissue sample so it can be looked at in a lab (biopsy).  Remove growths.  Remove objects (foreign bodies) that are stuck.  Treat any bleeding with medicines or other devices that stop tissue from bleeding.  Widen (dilate) or stretch narrowed areas of your esophagus and stomach.  The endoscope will be taken out. The procedure may vary among health care providers and hospitals. What happens after the procedure?  Your blood pressure, heart rate, breathing rate, and blood oxygen level will be monitored often until the medicines you were given have worn off.  Do not eat or drink anything until the numbing medicine has worn off and your gag reflex has returned. This information is not intended to replace advice given to you by your health care provider. Make sure you discuss any questions you have with your health care provider. Document Released: 07/27/2004 Document Revised: 09/01/2015 Document Reviewed: 02/17/2015  2017 Elsevier Esophagogastroduodenoscopy, Care After Introduction Refer to this sheet in the next few weeks. These instructions provide you with information about caring for yourself after your procedure. Your health care provider may also give you more specific instructions. Your treatment has been planned according to current medical practices, but problems sometimes occur. Call your health care provider if you have any problems or questions after your procedure. What can I expect after the procedure? After the procedure, it is common to have:  A sore throat.  Nausea.  Bloating.  Dizziness.  Fatigue. Follow these instructions at home:  Do not eat or drink anything until the numbing medicine (local anesthetic) has worn off and  your gag reflex has returned. You will know that the local anesthetic has worn off when you can swallow comfortably.  Do not drive for 24 hours if you received a medicine to help you relax (sedative).  If your health care provider took a tissue sample for testing during the procedure, make sure to get your test results. This is your responsibility. Ask your health care provider or the department performing the test when your results will be ready.  Keep all follow-up visits as told by your health care provider. This is important. Contact a health care provider if:  You cannot stop coughing.  You are not urinating.  You are urinating less than usual. Get help right away if:  You have trouble swallowing.  You cannot eat or drink.  You have throat or chest pain that gets worse.  You are dizzy or light-headed.  You faint.  You have nausea or vomiting.  You have chills.  You have a fever.  You have severe abdominal pain.  You have black, tarry, or bloody stools. This information is not intended to replace advice given to you by your health care provider. Make sure you  discuss any questions you have with your health care provider. Document Released: 03/12/2012 Document Revised: 09/01/2015 Document Reviewed: 02/17/2015  2017 Elsevier  Esophageal Dilatation Esophageal dilatation is a procedure to open a blocked or narrowed part of the esophagus. The esophagus is the long tube in your throat that carries food and liquid from your mouth to your stomach. The procedure is also called esophageal dilation. You may need this procedure if you have a buildup of scar tissue in your esophagus that makes it difficult, painful, or even impossible to swallow. This can be caused by gastroesophageal reflux disease (GERD). In rare cases, people need this procedure because they have cancer of the esophagus or a problem with the way food moves through the esophagus. Sometimes you may need to have  another dilatation to enlarge the opening of the esophagus gradually. Tell a health care provider about:  Any allergies you have.  All medicines you are taking, including vitamins, herbs, eye drops, creams, and over-the-counter medicines.  Any problems you or family members have had with anesthetic medicines.  Any blood disorders you have.  Any surgeries you have had.  Any medical conditions you have.  Any antibiotic medicines you are required to take before dental procedures. What are the risks? Generally, this is a safe procedure. However, problems can occur and include:  Bleeding from a tear in the lining of the esophagus.  A hole (perforation) in the esophagus. What happens before the procedure?  Do not eat or drink anything after midnight on the night before the procedure or as directed by your health care provider.  Ask your health care provider about changing or stopping your regular medicines. This is especially important if you are taking diabetes medicines or blood thinners.  Plan to have someone take you home after the procedure. What happens during the procedure?  You will be given a medicine that makes you relaxed and sleepy (sedative).  A medicine may be sprayed or gargled to numb the back of the throat.  Your health care provider can use various instruments to do an esophageal dilatation. During the procedure, the instrument used will be placed in your mouth and passed down into your esophagus. Options include:  Simple dilators. This instrument is carefully placed in the esophagus to stretch it.  Guided wire bougies. In this method, a flexible tube (endoscope) is used to insert a wire into the esophagus. The dilator is passed over this wire to enlarge the esophagus. Then the wire is removed.  Balloon dilators. An endoscope with a small balloon at the end is passed down into the esophagus. Inflating the balloon gently stretches the esophagus and opens it  up. What happens after the procedure?  Your blood pressure, heart rate, breathing rate, and blood oxygen level will be monitored often until the medicines you were given have worn off.  Your throat may feel slightly sore and will probably still feel numb. This will improve slowly over time.  You will not be allowed to eat or drink until the throat numbness has resolved.  If this is a same-day procedure, you may be allowed to go home once you have been able to drink, urinate, and sit on the edge of the bed without nausea or dizziness.  If this is a same-day procedure, you should have a friend or family member with you for the next 24 hours after the procedure. This information is not intended to replace advice given to you by your health care provider. Make  sure you discuss any questions you have with your health care provider. Document Released: 05/17/2005 Document Revised: 09/01/2015 Document Reviewed: 08/05/2013 Elsevier Interactive Patient Education  2017 [place].  Colonoscopy, Adult A colonoscopy is an exam to look at the entire large intestine. During the exam, a lubricated, bendable tube is inserted into the anus and then passed into the rectum, colon, and other parts of the large intestine. A colonoscopy is often done as a part of normal colorectal screening or in response to certain symptoms, such as anemia, persistent diarrhea, abdominal pain, and blood in the stool. The exam can help screen for and diagnose medical problems, including:  Tumors.  Polyps.  Inflammation.  Areas of bleeding. Tell a health care provider about:  Any allergies you have.  All medicines you are taking, including vitamins, herbs, eye drops, creams, and over-the-counter medicines.  Any problems you or family members have had with anesthetic medicines.  Any blood disorders you have.  Any surgeries you have had.  Any medical conditions you have.  Any problems you have had passing  stool. What are the risks? Generally, this is a safe procedure. However, problems may occur, including:  Bleeding.  A tear in the intestine.  A reaction to medicines given during the exam.  Infection (rare). What happens before the procedure? Eating and drinking restrictions  Follow instructions from your health care provider about eating and drinking, which may include:  A few days before the procedure - follow a low-fiber diet. Avoid nuts, seeds, dried fruit, raw fruits, and vegetables.  1-3 days before the procedure - follow a clear liquid diet. Drink only clear liquids, such as clear broth or bouillon, black coffee or tea, clear juice, clear soft drinks or sports drinks, gelatin desert, and popsicles. Avoid any liquids that contain red or purple dye.  On the day of the procedure - do not eat or drink anything during the 2 hours before the procedure, or within the time period that your health care provider recommends. Bowel prep  If you were prescribed an oral bowel prep to clean out your colon:  Take it as told by your health care provider. Starting the day before your procedure, you will need to drink a large amount of medicated liquid. The liquid will cause you to have multiple loose stools until your stool is almost clear or light green.  If your skin or anus gets irritated from diarrhea, you may use these to relieve the irritation:  Medicated wipes, such as adult wet wipes with aloe and vitamin E.  A skin soothing-product like petroleum jelly.  If you vomit while drinking the bowel prep, take a break for up to 60 minutes and then begin the bowel prep again. If vomiting continues and you cannot take the bowel prep without vomiting, call your health care provider. General instructions  Ask your health care provider about changing or stopping your regular medicines. This is especially important if you are taking diabetes medicines or blood thinners.  Plan to have someone take  you home from the hospital or clinic. What happens during the procedure?  An IV tube may be inserted into one of your veins.  You will be given medicine to help you relax (sedative).  To reduce your risk of infection:  Your health care team will wash or sanitize their hands.  Your anal area will be washed with soap.  You will be asked to lie on your side with your knees bent.  Your health  care provider will lubricate a long, thin, flexible tube. The tube will have a camera and a light on the end.  The tube will be inserted into your anus.  The tube will be gently eased through your rectum and colon.  Air will be delivered into your colon to keep it open. You may feel some pressure or cramping.  The camera will be used to take images during the procedure.  A small tissue sample may be removed from your body to be examined under a microscope (biopsy). If any potential problems are found, the tissue will be sent to a lab for testing.  If small polyps are found, your health care provider may remove them and have them checked for cancer cells.  The tube that was inserted into your anus will be slowly removed. The procedure may vary among health care providers and hospitals. What happens after the procedure?  Your blood pressure, heart rate, breathing rate, and blood oxygen level will be monitored until the medicines you were given have worn off.  Do not drive for 24 hours after the exam.  You may have a small amount of blood in your stool.  You may pass gas and have mild abdominal cramping or bloating due to the air that was used to inflate your colon during the exam.  It is up to you to get the results of your procedure. Ask your health care provider, or the department performing the procedure, when your results will be ready. This information is not intended to replace advice given to you by your health care provider. Make sure you discuss any questions you have with your  health care provider. Document Released: 03/23/2000 Document Revised: 10/14/2015 Document Reviewed: 06/07/2015 Elsevier Interactive Patient Education  2017 [employer] Inc.  Colonoscopy, Adult, Care After This sheet gives you information about how to care for yourself after your procedure. Your health care provider may also give you more specific instructions. If you have problems or questions, contact your health care provider. What can I expect after the procedure? After the procedure, it is common to have:  A small amount of blood in your stool for 24 hours after the procedure.  Some gas.  Mild abdominal cramping or bloating. Follow these instructions at home: General instructions  For the first 24 hours after the procedure:  Do not drive or use machinery.  Do not sign important documents.  Do not drink alcohol.  Do your regular daily activities at a slower pace than normal.  Eat soft, easy-to-digest foods.  Rest often.  Take over-the-counter or prescription medicines only as told by your health care provider.  It is up to you to get the results of your procedure. Ask your health care provider, or the department performing the procedure, when your results will be ready. Relieving cramping and bloating  Try walking around when you have cramps or feel bloated.  Apply heat to your abdomen as told by your health care provider. Use a heat source that your health care provider recommends, such as a moist heat pack or a heating pad.  Place a towel between your skin and the heat source.  Leave the heat on for 20-30 minutes.  Remove the heat if your skin turns bright red. This is especially important if you are unable to feel pain, heat, or cold. You may have a greater risk of getting burned. Eating and drinking  Drink enough fluid to keep your urine clear or pale yellow.  Resume your  normal diet as instructed by your health care provider. Avoid heavy or fried foods that are  hard to digest.  Avoid drinking alcohol for as long as instructed by your health care provider. Contact a health care provider if:  You have blood in your stool 2-3 days after the procedure. Get help right away if:  You have more than a small spotting of blood in your stool.  You pass large blood clots in your stool.  Your abdomen is swollen.  You have nausea or vomiting.  You have a fever.  You have increasing abdominal pain that is not relieved with medicine. This information is not intended to replace advice given to you by your health care provider. Make sure you discuss any questions you have with your health care provider. Document Released: 11/08/2003 Document Revised: 12/19/2015 Document Reviewed: 06/07/2015 Elsevier Interactive Patient Education  2017 [place] Anesthesia is a term that refers to techniques, procedures, and medicines that help a person stay safe and comfortable during a medical procedure. Monitored anesthesia care, or sedation, is one type of anesthesia. Your anesthesia specialist may recommend sedation if you will be having a procedure that does not require you to be unconscious, such as:  Cataract surgery.  A dental procedure.  A biopsy.  A colonoscopy. During the procedure, you may receive a medicine to help you relax (sedative). There are three levels of sedation:  Mild sedation. At this level, you may feel awake and relaxed. You will be able to follow directions.  Moderate sedation. At this level, you will be sleepy. You may not remember the procedure.  Deep sedation. At this level, you will be asleep. You will not remember the procedure. The more medicine you are given, the deeper your level of sedation will be. Depending on how you respond to the procedure, the anesthesia specialist may change your level of sedation or the type of anesthesia to fit your needs. An anesthesia specialist will monitor you closely  during the procedure. Let your health care provider know about:  Any allergies you have.  All medicines you are taking, including vitamins, herbs, eye drops, creams, and over-the-counter medicines.  Any use of steroids (by mouth or as a cream).  Any problems you or family members have had with sedatives and anesthetic medicines.  Any blood disorders you have.  Any surgeries you have had.  Any medical conditions you have, such as sleep apnea.  Whether you are pregnant or may be pregnant.  Any use of cigarettes, alcohol, or street drugs. What are the risks? Generally, this is a safe procedure. However, problems may occur, including:  Getting too much medicine (oversedation).  Nausea.  Allergic reaction to medicines.  Trouble breathing. If this happens, a breathing tube may be used to help with breathing. It will be removed when you are awake and breathing on your own.  Heart trouble.  Lung trouble. Before the procedure Staying hydrated  Follow instructions from your health care provider about hydration, which may include:  Up to 2 hours before the procedure - you may continue to drink clear liquids, such as water, clear fruit juice, black coffee, and plain tea. Eating and drinking restrictions  Follow instructions from your health care provider about eating and drinking, which may include:  8 hours before the procedure - stop eating heavy meals or foods such as meat, fried foods, or fatty foods.  6 hours before the procedure - stop eating light meals or foods,  such as toast or cereal.  6 hours before the procedure - stop drinking milk or drinks that contain milk.  2 hours before the procedure - stop drinking clear liquids. Medicines  Ask your health care provider about:  Changing or stopping your regular medicines. This is especially important if you are taking diabetes medicines or blood thinners.  Taking medicines such as aspirin and ibuprofen. These medicines  can thin your blood. Do not take these medicines before your procedure if your health care provider instructs you not to. Tests and exams  You will have a physical exam.  You may have blood tests done to show:  How well your kidneys and liver are working.  How well your blood can clot.  General instructions  Plan to have someone take you home from the hospital or clinic.  If you will be going home right after the procedure, plan to have someone with you for 24 hours. What happens during the procedure?  Your blood pressure, heart rate, breathing, level of pain and overall condition will be monitored.  An IV tube will be inserted into one of your veins.  Your anesthesia specialist will give you medicines as needed to keep you comfortable during the procedure. This may mean changing the level of sedation.  The procedure will be performed. After the procedure  Your blood pressure, heart rate, breathing rate, and blood oxygen level will be monitored until the medicines you were given have worn off.  Do not drive for 24 hours if you received a sedative.  You may:  Feel sleepy, clumsy, or nauseous.  Feel forgetful about what happened after the procedure.  Have a sore throat if you had a breathing tube during the procedure.  Vomit. This information is not intended to replace advice given to you by your health care provider. Make sure you discuss any questions you have with your health care provider. Document Released: 12/20/2004 Document Revised: 09/02/2015 Document Reviewed: 07/17/2015 Elsevier Interactive Patient Education  2017 [place] POST-ANESTHESIA  IMMEDIATELY FOLLOWING SURGERY:  Do not drive or operate machinery for the first twenty four hours after surgery.  Do not make any important decisions for twenty four hours after surgery or while taking narcotic pain medications or sedatives.  If you develop intractable nausea and vomiting or a severe  headache please notify your doctor immediately.  FOLLOW-UP:  Please make an appointment with your surgeon as instructed. You do not need to follow up with anesthesia unless specifically instructed to do so.  WOUND CARE INSTRUCTIONS (if applicable):  Keep a dry clean dressing on the anesthesia/puncture wound site if there is drainage.  Once the wound has quit draining you may leave it open to air.  Generally you should leave the bandage intact for twenty four hours unless there is drainage.  If the epidural site drains for more than 36-48 hours please call the anesthesia department.  QUESTIONS?:  Please feel free to call your physician or the hospital operator if you have any questions, and they will be happy to assist you.

## 2016-03-14 ENCOUNTER — Encounter (HOSPITAL_COMMUNITY)
Admission: RE | Admit: 2016-03-14 | Discharge: 2016-03-14 | Disposition: A | Discharge status: Home / Self Care | Payer: Medicare Other | Source: Ambulatory Visit | Attending: Internal Medicine | Admitting: Internal Medicine

## 2016-03-14 ENCOUNTER — Encounter (HOSPITAL_COMMUNITY): Payer: Self-pay

## 2016-03-14 DIAGNOSIS — R1310 Dysphagia, unspecified: Secondary | ICD-10-CM | POA: Diagnosis not present

## 2016-03-14 DIAGNOSIS — E785 Hyperlipidemia, unspecified: Secondary | ICD-10-CM | POA: Insufficient documentation

## 2016-03-14 DIAGNOSIS — Z01810 Encounter for preprocedural cardiovascular examination: Secondary | ICD-10-CM | POA: Diagnosis not present

## 2016-03-14 DIAGNOSIS — I10 Essential (primary) hypertension: Secondary | ICD-10-CM | POA: Insufficient documentation

## 2016-03-14 DIAGNOSIS — E119 Type 2 diabetes mellitus without complications: Secondary | ICD-10-CM | POA: Diagnosis not present

## 2016-03-14 DIAGNOSIS — Z86010 Personal history of colonic polyps: Secondary | ICD-10-CM | POA: Insufficient documentation

## 2016-03-14 DIAGNOSIS — F411 Generalized anxiety disorder: Secondary | ICD-10-CM | POA: Diagnosis not present

## 2016-03-14 DIAGNOSIS — K219 Gastro-esophageal reflux disease without esophagitis: Secondary | ICD-10-CM | POA: Diagnosis not present

## 2016-03-14 DIAGNOSIS — M1990 Unspecified osteoarthritis, unspecified site: Secondary | ICD-10-CM | POA: Diagnosis not present

## 2016-03-14 DIAGNOSIS — K5900 Constipation, unspecified: Secondary | ICD-10-CM | POA: Diagnosis not present

## 2016-03-14 DIAGNOSIS — I2510 Atherosclerotic heart disease of native coronary artery without angina pectoris: Secondary | ICD-10-CM | POA: Insufficient documentation

## 2016-03-14 DIAGNOSIS — D171 Benign lipomatous neoplasm of skin and subcutaneous tissue of trunk: Secondary | ICD-10-CM | POA: Insufficient documentation

## 2016-03-14 DIAGNOSIS — E039 Hypothyroidism, unspecified: Secondary | ICD-10-CM | POA: Insufficient documentation

## 2016-03-14 DIAGNOSIS — H8110 Benign paroxysmal vertigo, unspecified ear: Secondary | ICD-10-CM | POA: Diagnosis not present

## 2016-03-14 DIAGNOSIS — Z01812 Encounter for preprocedural laboratory examination: Secondary | ICD-10-CM | POA: Insufficient documentation

## 2016-03-14 HISTORY — DX: Hypothyroidism, unspecified: E03.9

## 2016-03-14 HISTORY — DX: Cardiac arrhythmia, unspecified: I49.9

## 2016-03-14 LAB — CBC
HCT: 38.3 % (ref 36.0–46.0)
Hemoglobin: 12.3 g/dL (ref 12.0–15.0)
MCH: 29.7 pg (ref 26.0–34.0)
MCHC: 32.1 g/dL (ref 30.0–36.0)
MCV: 92.5 fL (ref 78.0–100.0)
Platelets: 179 10*3/uL (ref 150–400)
RBC: 4.14 MIL/uL (ref 3.87–5.11)
RDW: 14.6 % (ref 11.5–15.5)
WBC: 4.7 10*3/uL (ref 4.0–10.5)

## 2016-03-14 LAB — BASIC METABOLIC PANEL
Anion gap: 6 (ref 5–15)
BUN: 12 mg/dL (ref 6–20)
CO2: 26 mmol/L (ref 22–32)
Calcium: 8.7 mg/dL — ABNORMAL LOW (ref 8.9–10.3)
Chloride: 106 mmol/L (ref 101–111)
Creatinine, Ser: 0.73 mg/dL (ref 0.44–1.00)
GFR calc Af Amer: >60 mL/min (ref >60)
GFR calc non Af Amer: >60 mL/min (ref >60)
Glucose, Bld: 195 mg/dL — ABNORMAL HIGH (ref 65–99)
Potassium: 3.4 mmol/L — ABNORMAL LOW (ref 3.5–5.1)
Sodium: 138 mmol/L (ref 135–145)

## 2016-03-19 ENCOUNTER — Ambulatory Visit (HOSPITAL_COMMUNITY): Payer: Medicare Other | Admitting: Anesthesiology

## 2016-03-19 ENCOUNTER — Encounter (HOSPITAL_COMMUNITY)
Admission: RE | Disposition: A | Discharge status: Home / Self Care | Payer: Self-pay | Source: Ambulatory Visit | Attending: Internal Medicine

## 2016-03-19 ENCOUNTER — Encounter (HOSPITAL_COMMUNITY): Payer: Self-pay

## 2016-03-19 ENCOUNTER — Ambulatory Visit (HOSPITAL_COMMUNITY)
Admission: RE | Admit: 2016-03-19 | Discharge: 2016-03-19 | Disposition: A | Discharge status: Home / Self Care | Payer: Medicare Other | Source: Ambulatory Visit | Attending: Internal Medicine | Admitting: Internal Medicine

## 2016-03-19 DIAGNOSIS — K209 Esophagitis, unspecified: Secondary | ICD-10-CM | POA: Diagnosis not present

## 2016-03-19 DIAGNOSIS — Z1211 Encounter for screening for malignant neoplasm of colon: Secondary | ICD-10-CM | POA: Diagnosis not present

## 2016-03-19 DIAGNOSIS — G4730 Sleep apnea, unspecified: Secondary | ICD-10-CM | POA: Diagnosis not present

## 2016-03-19 DIAGNOSIS — I4891 Unspecified atrial fibrillation: Secondary | ICD-10-CM | POA: Diagnosis not present

## 2016-03-19 DIAGNOSIS — I10 Essential (primary) hypertension: Secondary | ICD-10-CM | POA: Diagnosis not present

## 2016-03-19 DIAGNOSIS — K210 Gastro-esophageal reflux disease with esophagitis: Secondary | ICD-10-CM | POA: Insufficient documentation

## 2016-03-19 DIAGNOSIS — K5900 Constipation, unspecified: Secondary | ICD-10-CM | POA: Insufficient documentation

## 2016-03-19 DIAGNOSIS — Z955 Presence of coronary angioplasty implant and graft: Secondary | ICD-10-CM | POA: Diagnosis not present

## 2016-03-19 DIAGNOSIS — K219 Gastro-esophageal reflux disease without esophagitis: Secondary | ICD-10-CM | POA: Diagnosis not present

## 2016-03-19 DIAGNOSIS — I2510 Atherosclerotic heart disease of native coronary artery without angina pectoris: Secondary | ICD-10-CM | POA: Diagnosis not present

## 2016-03-19 DIAGNOSIS — Z87891 Personal history of nicotine dependence: Secondary | ICD-10-CM | POA: Insufficient documentation

## 2016-03-19 DIAGNOSIS — K640 First degree hemorrhoids: Secondary | ICD-10-CM

## 2016-03-19 DIAGNOSIS — Z79899 Other long term (current) drug therapy: Secondary | ICD-10-CM | POA: Diagnosis not present

## 2016-03-19 DIAGNOSIS — Z7984 Long term (current) use of oral hypoglycemic drugs: Secondary | ICD-10-CM | POA: Diagnosis not present

## 2016-03-19 DIAGNOSIS — R1310 Dysphagia, unspecified: Secondary | ICD-10-CM | POA: Insufficient documentation

## 2016-03-19 DIAGNOSIS — Z7901 Long term (current) use of anticoagulants: Secondary | ICD-10-CM | POA: Diagnosis not present

## 2016-03-19 DIAGNOSIS — F329 Major depressive disorder, single episode, unspecified: Secondary | ICD-10-CM | POA: Diagnosis not present

## 2016-03-19 DIAGNOSIS — K5730 Diverticulosis of large intestine without perforation or abscess without bleeding: Secondary | ICD-10-CM | POA: Diagnosis not present

## 2016-03-19 DIAGNOSIS — Z7982 Long term (current) use of aspirin: Secondary | ICD-10-CM | POA: Diagnosis not present

## 2016-03-19 DIAGNOSIS — I252 Old myocardial infarction: Secondary | ICD-10-CM | POA: Diagnosis not present

## 2016-03-19 DIAGNOSIS — E119 Type 2 diabetes mellitus without complications: Secondary | ICD-10-CM | POA: Diagnosis not present

## 2016-03-19 DIAGNOSIS — E079 Disorder of thyroid, unspecified: Secondary | ICD-10-CM | POA: Insufficient documentation

## 2016-03-19 DIAGNOSIS — Z86010 Personal history of colonic polyps: Secondary | ICD-10-CM | POA: Insufficient documentation

## 2016-03-19 HISTORY — PX: MALONEY DILATION: SHX5535

## 2016-03-19 HISTORY — PX: ESOPHAGOGASTRODUODENOSCOPY (EGD) WITH PROPOFOL: SHX5813

## 2016-03-19 HISTORY — PX: COLONOSCOPY WITH PROPOFOL: SHX5780

## 2016-03-19 LAB — GLUCOSE, CAPILLARY
Glucose-Capillary: 143 mg/dL — ABNORMAL HIGH (ref 65–99)
Glucose-Capillary: 136 mg/dL — ABNORMAL HIGH (ref 65–99)

## 2016-03-19 SURGERY — COLONOSCOPY WITH PROPOFOL
Anesthesia: Monitor Anesthesia Care

## 2016-03-19 MED ORDER — LIDOCAINE VISCOUS 2 % MT SOLN
OROMUCOSAL | Status: AC
  Filled 2016-03-19: qty 15

## 2016-03-19 MED ORDER — GLYCOPYRROLATE 0.2 MG/ML IJ SOLN
0.2000 mg | Freq: Once | INTRAMUSCULAR | Status: AC | PRN
  Administered 2016-03-19: 0.2 mg via INTRAVENOUS

## 2016-03-19 MED ORDER — LACTATED RINGERS IV SOLN
INTRAVENOUS | Status: DC
  Administered 2016-03-19: 1000 mL via INTRAVENOUS

## 2016-03-19 MED ORDER — LIDOCAINE VISCOUS 2 % MT SOLN
OROMUCOSAL | Status: DC | PRN
  Administered 2016-03-19: 1 via OROMUCOSAL

## 2016-03-19 MED ORDER — PROPOFOL 500 MG/50ML IV EMUL
INTRAVENOUS | Status: DC | PRN
  Administered 2016-03-19: 150 ug/kg/min via INTRAVENOUS
  Administered 2016-03-19: 09:00:00 via INTRAVENOUS

## 2016-03-19 MED ORDER — FENTANYL CITRATE (PF) 100 MCG/2ML IJ SOLN
INTRAMUSCULAR | Status: AC
  Filled 2016-03-19: qty 2

## 2016-03-19 MED ORDER — MIDAZOLAM HCL 2 MG/2ML IJ SOLN
1.0000 mg | INTRAMUSCULAR | Status: DC | PRN
  Administered 2016-03-19: 2 mg via INTRAVENOUS

## 2016-03-19 MED ORDER — FENTANYL CITRATE (PF) 100 MCG/2ML IJ SOLN
25.0000 ug | INTRAMUSCULAR | Status: AC | PRN
  Administered 2016-03-19 (×2): 25 ug via INTRAVENOUS

## 2016-03-19 MED ORDER — LIDOCAINE VISCOUS 2 % MT SOLN: 5.0000 mL | Freq: Once | OROMUCOSAL | Status: DC

## 2016-03-19 MED ORDER — GLYCOPYRROLATE 0.2 MG/ML IJ SOLN
INTRAMUSCULAR | Status: AC
  Filled 2016-03-19: qty 1

## 2016-03-19 MED ORDER — LIDOCAINE HCL (PF) 1 % IJ SOLN
INTRAMUSCULAR | Status: AC
  Filled 2016-03-19: qty 5

## 2016-03-19 MED ORDER — MIDAZOLAM HCL 2 MG/2ML IJ SOLN
INTRAMUSCULAR | Status: AC
  Filled 2016-03-19: qty 2

## 2016-03-19 MED ORDER — PROPOFOL 10 MG/ML IV BOLUS
INTRAVENOUS | Status: AC
  Filled 2016-03-19: qty 40

## 2016-03-19 MED ORDER — MIDAZOLAM HCL 5 MG/5ML IJ SOLN
INTRAMUSCULAR | Status: DC | PRN
  Administered 2016-03-19: 2 mg via INTRAVENOUS

## 2016-03-19 NOTE — Anesthesia Procedure Notes (Signed)
Procedure Name: MAC Date/Time: 03/19/2016 8:41 AM Performed by: Andree Elk, Breylon Sherrow A Pre-anesthesia Checklist: Patient identified, Emergency Drugs available, Suction available, Patient being monitored and Timeout performed Oxygen Delivery Method: Simple face mask

## 2016-03-19 NOTE — H&P (View-Only) (Signed)
Referring Provider: Sharion Balloon, FNP Primary Care Physician:  Evelina Dun, FNP Primary GI:  Dr. Gala Romney  Chief Complaint  Patient presents with  . Colonoscopy  . Dysphagia    HPI:   Rhonda Garcia is a 75 y.o. female who presents to schedule colonoscopy. She was last seen in our office 06/22/2015 for noted history of adenomatous colon polyps and due for surveillance colonoscopy. The patient is on Coumadin and will likely require Lovenox bridge with Cherre Robins, PharmD at primary care office to assist. Planned on propofol/MAC due to polypharmacy. Also recommended EGD with possible dilation due to history of dysphagia and worsening symptoms at time of last visit. He was started on Linzess due to constipation. Schedule procedure was canceled because primary care recommended against that at that time and the patient was undergoing stress test by cardiology.   She did call back and note that Linzess is working and requested a prescription sent to the pharmacy, which was done.  Hospitalized for chest pain, Left hear cath with no culprit lesion idenifed, preserved LV function, triple vessel CAD. Deemed possible AFib RVR with demand ischemia. She was discharged home shortly after.  Today she states she was told she had a "very small heart attack" around the time of her last visit which is why PCP did not want procedure done. She is due for 1 year repeat colonoscopy, PCP has cleared her for this. She is on chronic anticoagulation for AFib. Has some abdominal soreness in lower abdomen which improves with a bowel movement. Had recent bout with constipation requiring a lot of straining. Other than this one incident Linzess works well for her. Denies hematochezia, melena. Has had a subjective weight loss of 5 lbs in 2 weeks which she attributes to poor appetite with constipation.   Is also having solid food dysphagia and pill dysphagia, getting worse recently. Particularly worse with cornbread. Also  with odynophagia with swallowing. Also has chronic GERD with occasional reflux breakthrough. Denies fever, chills. Denies chest pain, dyspnea, dizziness, lightheadedness, syncope, near syncope. Denies any other upper or lower GI symptoms.   Past Medical History:  Diagnosis Date  . A-fib ([place])   . Anal fissure    resolved   . Anemia   . Back pain    with left radiculopathy  . CAD (coronary artery disease)   . CAD (coronary artery disease)   . DDD (degenerative disc disease)   . Depression   . Diabetes mellitus   . Dyslipidemia   . Generalized headaches   . GERD (gastroesophageal reflux disease)   . Hearing loss   . Hiatal hernia   . Hx of peptic ulcer   . Hyperlipidemia   . Hypertension   . NSTEMI (non-ST elevated myocardial infarction) ([place]) 08/05/2015  . Osteopenia   . Sebaceous cyst    on back  . Sleep apnea    not using CPAP now, could not tolerate and PCP aware.  . Thyroid disease     Past Surgical History:  Procedure Laterality Date  . ABDOMINAL HYSTERECTOMY  2001 - approximate  . APPENDECTOMY    . BREAST REDUCTION SURGERY    . CARDIAC CATHETERIZATION N/A 08/08/2015   Procedure: Left Heart Cath and Coronary Angiography;  Surgeon: Burnell Blanks, MD;  Location: [facility];  Service: Cardiovascular;  Laterality: N/A;  . CHOLECYSTECTOMY  2008 - approximate  . COLONOSCOPY  06/2007   Friable anal canal and anal papillae. Pancolonic diverticula. Normal terminal ileum.  Status post segmental biopsy, descending colon biopsy showed minimal cryptitis. Biopsies felt to be  nonspecific but could be seen with NSAIDs. No features of inflammatory bowel disease. Stool studies were negative.  . COLONOSCOPY  03/21/2011   RMR: colonic polyps treated as described above, colonic diverticulosis (Tubular adenomas)  . COLONOSCOPY WITH PROPOFOL N/A 06/17/2014   RMR: Colonic diverticulosis. Multiple colonic polyps removed as described above.   . CORONARY ANGIOPLASTY WITH STENT  PLACEMENT     4 stents  . ESOPHAGEAL DILATION N/A 06/17/2014   Procedure: ESOPHAGEAL DILATION;  Surgeon: Daneil Dolin, MD;  Location: [facility];  Service: Endoscopy;  Laterality: N/A;  Maloney 75 Fr  . ESOPHAGOGASTRODUODENOSCOPY  06/2007   Small hiatal hernia  . ESOPHAGOGASTRODUODENOSCOPY  03/21/2011   RMR: normal esophagus- status post passage of Maloney dilator. Small hiatal hernia. Trivial antral and bulbar erosions  . ESOPHAGOGASTRODUODENOSCOPY (EGD) WITH PROPOFOL N/A 06/17/2014   RMR: Small hiatal hernia; otherwise normal EGD. Status post passage of a Maloney dilator. No explanation for patients right upper quadrant abdominal pain.   Marland Kitchen KNEE SURGERY Left    arthroscopy  . LIPOMA EXCISION  03/17/2012   Procedure: EXCISION LIPOMA;  Surgeon: Gwenyth Ober, MD;  Location: [facility];  Service: General;  Laterality: Right;  excision of right lower back lipoma  . POLYPECTOMY  06/17/2014   Procedure: POLYPECTOMY;  Surgeon: Daneil Dolin, MD;  Location: [facility];  Service: Endoscopy;;    Current Outpatient Prescriptions  Medication Sig Dispense Refill  . ALPRAZolam (XANAX) 0.5 MG tablet Take 0.5 mg by mouth at bedtime as needed for anxiety.    Marland Kitchen amLODipine (NORVASC) 10 MG tablet Take 1 tablet (10 mg total) by mouth daily. 90 tablet 3  . aspirin EC 81 MG EC tablet Take 1 tablet (81 mg total) by mouth daily.    Marland Kitchen escitalopram (LEXAPRO) 10 MG tablet Take 1 tablet (10 mg total) by mouth daily. 90 tablet 3  . isosorbide mononitrate (IMDUR) 60 MG 24 hr tablet Take 1 tablet (60 mg total) by mouth daily. 90 tablet 3  . levothyroxine (LEVOTHROID) 25 MCG tablet Take 1 tablet (25 mcg total) by mouth daily before breakfast. 90 tablet 0  . Linaclotide (LINZESS) 145 MCG CAPS capsule Take 1 capsule (145 mcg total) by mouth daily. 90 capsule 3  . lisinopril-hydrochlorothiazide (PRINZIDE,ZESTORETIC) 20-12.5 MG per tablet Take 2 tablets by mouth daily. 180 tablet 6  . metFORMIN (GLUCOPHAGE) 500 MG  tablet TAKE TWO TABLETS BY MOUTH EVERY MORNING (Patient taking differently: take 1 tablet bid) 180 tablet 2  . metoprolol (LOPRESSOR) 50 MG tablet TAKE ONE TABLET BY MOUTH TWICE DAILY 60 tablet 5  . nitroGLYCERIN (NITROSTAT) 0.4 MG SL tablet Place 0.4 mg under the tongue every 5 (five) minutes as needed for chest pain.     . ranitidine (ZANTAC) 150 MG tablet Take 1 tablet (150 mg total) by mouth 2 (two) times daily. 180 tablet 1  . sertraline (ZOLOFT) 100 MG tablet   0  . spironolactone (ALDACTONE) 25 MG tablet Take 1 tablet (25 mg total) by mouth daily. 30 tablet 6  . traMADol (ULTRAM) 50 MG tablet Take 1 tablet (50 mg total) by mouth every 12 (twelve) hours as needed. 60 tablet 2  . warfarin (COUMADIN) 5 MG tablet Take 1 and 1/2 tablets on Wednesdays.  Take 1 tablet all other days. 45 tablet 1   No current facility-administered medications for this visit.     Allergies as  of 02/22/2016 - Review Complete 02/22/2016  Allergen Reaction Noted  . Promethazine hcl Anaphylaxis 09/06/2010    Family History  Problem Relation Age of Onset  . Heart attack Mother   . Hypertension Mother   . Stroke Mother     Deceased, age 27  . Diabetes Sister   . Diabetes Sister   . Cancer Sister     ovarian  . Stroke Sister   . Diabetes Brother     also has a blood disorder   . Clotting disorder Brother   . Melanoma Brother     deceased  . Coronary artery disease Other   . Kidney disease Other   . Colon cancer Neg Hx     Social History   Social History  . Marital status: Widowed    Spouse name: N/A  . Number of children: 3  . Years of education: N/A   Occupational History  . Retired    Social History Main Topics  . Smoking status: Former Smoker    Packs/day: 0.50    Years: 5.00    Types: Cigarettes    Quit date: 03/13/1981  . Smokeless tobacco: Never Used     Comment: quit 30 +years ago  . Alcohol use No  . Drug use: No  . Sexual activity: Yes   Other Topics Concern  . None    Social History Narrative   Married    Review of Systems: General: Negative for anorexia, fever, chills, fatigue, weakness. Eyes: Negative for vision changes.  ENT: Negative for hoarseness. Admits difficulty swallowing. CV: Negative for chest pain, angina, palpitations, peripheral edema.  Respiratory: Negative for dyspnea at rest, cough, sputum, wheezing.  GI: See history of present illness. Endo: Negative for unusual weight change.  Heme: Negative for bruising or bleeding. Allergy: Negative for rash or hives.   Physical Exam: BP 127/77   Pulse 74   Temp 97.8 F (36.6 C) (Oral)   Ht 5\' 2"  (1.575 m)   Wt 195 lb 3.2 oz (88.5 kg)   BMI 35.70 kg/m  General:   Alert and oriented. Pleasant and cooperative. Well-nourished and well-developed.  Head:  Normocephalic and atraumatic. Eyes:  Without icterus, sclera clear and conjunctiva pink.  Ears:  Normal auditory acuity. Cardiovascular:  S1, S2 present without murmurs appreciated. Extremities without clubbing or edema. Respiratory:  Clear to auscultation bilaterally. No wheezes, rales, or rhonchi. No distress.  Gastrointestinal:  +BS, soft, and non-distended. Mild lower abdominal TTP. No HSM noted. No guarding or rebound. No masses appreciated.  Rectal:  Deferred  Musculoskalatal:  Symmetrical without gross deformities. Neurologic:  Alert and oriented x4;  grossly normal neurologically. Psych:  Alert and cooperative. Normal mood and affect. Heme/Lymph/Immune: No excessive bruising noted.    02/22/2016 10:54 AM   Disclaimer: This note was dictated with voice recognition software. Similar sounding words can inadvertently be transcribed and may not be corrected upon review.

## 2016-03-19 NOTE — Discharge Instructions (Addendum)
Diverticulitis Diverticulitis is when small pockets that have formed in your colon (large intestine) become infected or swollen. Follow these instructions at home:  Follow your doctor's instructions.  Follow a special diet if told by your doctor.  When you feel better, your doctor may tell you to change your diet. You may be told to eat a lot of fiber. Fruits and vegetables are good sources of fiber. Fiber makes it easier to poop (have bowel movements).  Take supplements or probiotics as told by your doctor.  Only take medicines as told by your doctor.  Keep all follow-up visits with your doctor. Contact a doctor if:  Your pain does not get better.  You have a hard time eating food.  You are not pooping like normal. Get help right away if:  Your pain gets worse.  Your problems do not get better.  Your problems suddenly get worse.  You have a fever.  You keep throwing up (vomiting).  You have bloody or black, tarry poop (stool). This information is not intended to replace advice given to you by your health care provider. Make sure you discuss any questions you have with your health care provider. Document Released: 09/12/2007 Document Revised: 09/01/2015 Document Reviewed: 02/18/2013 Elsevier Interactive Patient Education  2017 [employer] Constipation, Adult Constipation is when a person:  Poops (has a bowel movement) fewer times in a week than normal.  Has a hard time pooping.  Has poop that is dry, hard, or bigger than normal. Follow these instructions at home: Eating and drinking  Eat foods that have a lot of fiber, such as:  Fresh fruits and vegetables.  Whole grains.  Beans.  Eat less of foods that are high in fat, low in fiber, or overly processed, such as:  [place] fries.  Hamburgers.  Cookies.  Candy.  Soda.  Drink enough fluid to keep your pee (urine) clear or pale yellow. General instructions  Exercise regularly or as told by your  doctor.  Go to the restroom when you feel like you need to poop. Do not hold it in.  Take over-the-counter and prescription medicines only as told by your doctor. These include any fiber supplements.  Do pelvic floor retraining exercises, such as:  Doing deep breathing while relaxing your lower belly (abdomen).  Relaxing your pelvic floor while pooping.  Watch your condition for any changes.  Keep all follow-up visits as told by your doctor. This is important. Contact a doctor if:  You have pain that gets worse.  You have a fever.  You have not pooped for 4 days.  You throw up (vomit).  You are not hungry.  You lose weight.  You are bleeding from the anus.  You have thin, pencil-like poop (stool). Get help right away if:  You have a fever, and your symptoms suddenly get worse.  You leak poop or have blood in your poop.  Your belly feels hard or bigger than normal (is bloated).  You have very bad belly pain.  You feel dizzy or you faint. This information is not intended to replace advice given to you by your health care provider. Make sure you discuss any questions you have with your health care provider. Document Released: 09/12/2007 Document Revised: 10/14/2015 Document Reviewed: 09/14/2015 Elsevier Interactive Patient Education  2017 [place] for Gastroesophageal Reflux Disease, Adult When you have gastroesophageal reflux disease (GERD), the foods you eat and your eating habits are very important. Choosing the right foods can  help ease your discomfort. What guidelines do I need to follow?  Choose fruits, vegetables, whole grains, and low-fat dairy products.  Choose low-fat meat, fish, and poultry.  Limit fats such as oils, salad dressings, butter, nuts, and avocado.  Keep a food diary. This helps you identify foods that cause symptoms.  Avoid foods that cause symptoms. These may be different for everyone.  Eat small meals often instead  of 3 large meals a day.  Eat your meals slowly, in a place where you are relaxed.  Limit fried foods.  Cook foods using methods other than frying.  Avoid drinking alcohol.  Avoid drinking large amounts of liquids with your meals.  Avoid bending over or lying down until 2-3 hours after eating. What foods are not recommended? These are some foods and drinks that may make your symptoms worse: Vegetables  Tomatoes. Tomato juice. Tomato and spaghetti sauce. Chili peppers. Onion and garlic. Horseradish. Fruits  Oranges, grapefruit, and lemon (fruit and juice). Meats  High-fat meats, fish, and poultry. This includes hot dogs, ribs, ham, sausage, salami, and bacon. Dairy  Whole milk and chocolate milk. Sour cream. Cream. Butter. Ice cream. Cream cheese. Drinks  Coffee and tea. Bubbly (carbonated) drinks or energy drinks. Condiments  Hot sauce. Barbecue sauce. Sweets/Desserts  Chocolate and cocoa. Donuts. Peppermint and spearmint. Fats and Oils  High-fat foods. This includes [place] fries and potato chips. Other  Vinegar. Strong spices. This includes black pepper, white pepper, red pepper, cayenne, curry powder, cloves, ginger, and chili powder. The items listed above may not be a complete list of foods and drinks to avoid. Contact your dietitian for more information.  This information is not intended to replace advice given to you by your health care provider. Make sure you discuss any questions you have with your health care provider. Document Released: 09/25/2011 Document Revised: 09/01/2015 Document Reviewed: 01/28/2013 Elsevier Interactive Patient Education  2017 [place].  Colonoscopy Discharge Instructions  Read the instructions outlined below and refer to this sheet in the next few weeks. These discharge instructions provide you with general information on caring for yourself after you leave the hospital. Your doctor may also give you specific instructions. While your  treatment has been planned according to the most current medical practices available, unavoidable complications occasionally occur. If you have any problems or questions after discharge, call Dr. Gala Romney at [phone]. ACTIVITY  You may resume your regular activity, but move at a slower pace for the next 24 hours.   Take frequent rest periods for the next 24 hours.   Walking will help get rid of the air and reduce the bloated feeling in your belly (abdomen).   No driving for 24 hours (because of the medicine (anesthesia) used during the test).    Do not sign any important legal documents or operate any machinery for 24 hours (because of the anesthesia used during the test).  NUTRITION  Drink plenty of fluids.   You may resume your normal diet as instructed by your doctor.   Begin with a light meal and progress to your normal diet. Heavy or fried foods are harder to digest and may make you feel sick to your stomach (nauseated).   Avoid alcoholic beverages for 24 hours or as instructed.  MEDICATIONS  You may resume your normal medications unless your doctor tells you otherwise.  WHAT YOU CAN EXPECT TODAY  Some feelings of bloating in the abdomen.   Passage of more gas than usual.   Spotting  of blood in your stool or on the toilet paper.  IF YOU HAD POLYPS REMOVED DURING THE COLONOSCOPY:  No aspirin products for 7 days or as instructed.   No alcohol for 7 days or as instructed.   Eat a soft diet for the next 24 hours.  FINDING OUT THE RESULTS OF YOUR TEST Not all test results are available during your visit. If your test results are not back during the visit, make an appointment with your caregiver to find out the results. Do not assume everything is normal if you have not heard from your caregiver or the medical facility. It is important for you to follow up on all of your test results.  SEEK IMMEDIATE MEDICAL ATTENTION IF:  You have more than a spotting of blood in your stool.     Your belly is swollen (abdominal distention).   You are nauseated or vomiting.   You have a temperature over 101.   You have abdominal pain or discomfort that is severe or gets worse throughout the day.  EGD Discharge instructions Please read the instructions outlined below and refer to this sheet in the next few weeks. These discharge instructions provide you with general information on caring for yourself after you leave the hospital. Your doctor may also give you specific instructions. While your treatment has been planned according to the most current medical practices available, unavoidable complications occasionally occur. If you have any problems or questions after discharge, please call your doctor. ACTIVITY  You may resume your regular activity but move at a slower pace for the next 24 hours.   Take frequent rest periods for the next 24 hours.   Walking will help expel (get rid of) the air and reduce the bloated feeling in your abdomen.   No driving for 24 hours (because of the anesthesia (medicine) used during the test).   You may shower.   Do not sign any important legal documents or operate any machinery for 24 hours (because of the anesthesia used during the test).  NUTRITION  Drink plenty of fluids.   You may resume your normal diet.   Begin with a light meal and progress to your normal diet.   Avoid alcoholic beverages for 24 hours or as instructed by your caregiver.  MEDICATIONS  You may resume your normal medications unless your caregiver tells you otherwise.  WHAT YOU CAN EXPECT TODAY  You may experience abdominal discomfort such as a feeling of fullness or gas pains.  FOLLOW-UP  Your doctor will discuss the results of your test with you.  SEEK IMMEDIATE MEDICAL ATTENTION IF ANY OF THE FOLLOWING OCCUR:  Excessive nausea (feeling sick to your stomach) and/or vomiting.   Severe abdominal pain and distention (swelling).   Trouble swallowing.    Temperature over 101 F (37.8 C).   Rectal bleeding or vomiting of blood.    Increase Protonix to 40 mg twice daily  Continue Linzess daily along with stool softener  GERD, diverticulosis and constipation information provided  Resume Coumadin today.  Repeat colonoscopy in 3 years  Office visit with [place] in 3 months

## 2016-03-19 NOTE — Transfer of Care (Signed)
Immediate Anesthesia Transfer of Care Note  Patient: [facility]  Procedure(s) Performed: Procedure(s) with comments: COLONOSCOPY WITH PROPOFOL (N/A) - 8:45am ESOPHAGOGASTRODUODENOSCOPY (EGD) WITH PROPOFOL (N/A) MALONEY DILATION (N/A)  Patient Location: PACU  Anesthesia Type:MAC  Level of Consciousness: awake, oriented and patient cooperative  Airway & Oxygen Therapy: Patient Spontanous Breathing and Patient connected to nasal cannula oxygen  Post-op Assessment: Report given to RN and Post -op Vital signs reviewed and stable  Post vital signs: Reviewed and stable  Last Vitals:  Vitals:   03/19/16 0830 03/19/16 0835  BP: (!) 143/67 (!) 156/64  Pulse:    Resp: (!) 25 (!) 23  Temp:      Last Pain:  Vitals:   03/19/16 0747  TempSrc: Oral         Complications: No apparent anesthesia complications

## 2016-03-19 NOTE — Op Note (Signed)
[facility] Patient Name: [facility] Procedure Date: 03/19/2016 8:58 AM MRN: [id number] Date of Birth: Oct 11, 1940 Attending MD: Norvel Richards , MD CSN: [id number] Age: 75 Admit Type: Outpatient Procedure:                Colonoscopy Indications:              Surveillance: History of piecemeal removal adenoma                            on last colonoscopy (< 3 yrs) Providers:                Norvel Richards, MD, Gwenlyn Fudge RN, RN,                            Randa Spike, Technician, Aram Candela Referring MD:              Medicines:                Propofol per Anesthesia Complications:            No immediate complications. Estimated Blood Loss:     Estimated blood loss: none. Procedure:                Pre-Anesthesia Assessment:                           - Prior to the procedure, a History and Physical                            was performed, and patient medications and                            allergies were reviewed. The patient's tolerance of                            previous anesthesia was also reviewed. The risks                            and benefits of the procedure and the sedation                            options and risks were discussed with the patient.                            All questions were answered, and informed consent                            was obtained. Prior Anticoagulants: The patient                            last took Coumadin (warfarin) 4 days prior to the                            procedure. ASA Grade Assessment: III - A patient  with severe systemic disease. After reviewing the                            risks and benefits, the patient was deemed in                            satisfactory condition to undergo the procedure.                           - Prior to the procedure, a History and Physical                            was performed, and patient medications and   allergies were reviewed. The patient's tolerance of                            previous anesthesia was also reviewed. The risks                            and benefits of the procedure and the sedation                            options and risks were discussed with the patient.                            All questions were answered, and informed consent                            was obtained. Prior Anticoagulants: The patient                            last took Coumadin (warfarin) 4 days prior to the                            procedure. ASA Grade Assessment: III - A patient                            with severe systemic disease. After reviewing the                            risks and benefits, the patient was deemed in                            satisfactory condition to undergo the procedure.                           After obtaining informed consent, the colonoscope                            was passed under direct vision. Throughout the                            procedure, the patient's blood pressure, pulse, and  oxygen saturations were monitored continuously. The                            [id number] scope was introduced through                            the anus and advanced to the the cecum, identified                            by appendiceal orifice and ileocecal valve. The                            colonoscopy was performed without difficulty. The                            patient tolerated the procedure well. The quality                            of the bowel preparation was adequate. The entire                            colon was well visualized. The ileocecal valve,                            appendiceal orifice, and rectum were photographed. Scope In: 9:05:33 AM Scope Out: 9:17:15 AM Scope Withdrawal Time: 0 hours 8 minutes 5 seconds  Total Procedure Duration: 0 hours 11 minutes 42 seconds  Findings:      The perianal and  digital rectal examinations were normal.      Internal hemorrhoids were found during retroflexion. The hemorrhoids       were moderate and Grade I (internal hemorrhoids that do not prolapse).      Scattered small and large-mouthed diverticula were found in the entire       colon.      The exam was otherwise without abnormality on direct and retroflexion       views. Impression:               - Internal hemorrhoids.                           - Diverticulosis in the entire examined colon.                            Lipoma?"1 cm ascending segment. Positive pillow sign.                           - The examination was otherwise normal on direct                            and retroflexion views. No residual polyp Seen                           - No specimens collected. Moderate Sedation:      Moderate (conscious) sedation was personally administered by an       anesthesia  professional. The following parameters were monitored: oxygen       saturation, heart rate, blood pressure, respiratory rate, EKG, adequacy       of pulmonary ventilation, and response to care. Total physician       intraservice time was 35 minutes. Recommendation:           - Patient has a contact number available for                            emergencies. The signs and symptoms of potential                            delayed complications were discussed with the                            patient. Return to normal activities tomorrow.                            Written discharge instructions were provided to the                            patient.                           - Resume previous diet.                           - Continue present medications. Resume Coumadin                            today.                           - Repeat colonoscopy in 3 years for surveillance.                           - Return to GI office in 3 months. See EGD report Procedure Code(s):        --- Professional ---                            [phone], Colonoscopy, flexible; diagnostic, including                            collection of specimen(s) by brushing or washing,                            when performed (separate procedure) Diagnosis Code(s):        --- Professional ---                           Z86.010, Personal history of colonic polyps                           K64.0, First degree hemorrhoids                           K57.30,  Diverticulosis of large intestine without                            perforation or abscess without bleeding CPT copyright 2016 [employer]. All rights reserved. The codes documented in this report are preliminary and upon coder review may  be revised to meet current compliance requirements. Cristopher Estimable. Linet Brash, MD Norvel Richards, MD 03/19/2016 9:24:28 AM This report has been signed electronically. Number of Addenda: 0

## 2016-03-19 NOTE — Interval H&P Note (Signed)
History and Physical Interval Note:  03/19/2016 8:29 AM  Rhonda Garcia  has presented today for surgery, with the diagnosis of hx adenomatous polyps, dysphagia, GERD  The various methods of treatment have been discussed with the patient and family. After consideration of risks, benefits and other options for treatment, the patient has consented to  Procedure(s) with comments: COLONOSCOPY WITH PROPOFOL (N/A) - 8:45am ESOPHAGOGASTRODUODENOSCOPY (EGD) WITH PROPOFOL (N/A) MALONEY DILATION (N/A) as a surgical intervention .  The patient's history has been reviewed, patient examined, no change in status, stable for surgery.  I have reviewed the patient's chart and labs.  Questions were answered to the patient's satisfaction.     Rhonda Garcia  No change. EGD/EGD and colonoscopy per plan. Coumadin held 4 days. PCP felt no Lovenox bridging needed.  The risks, benefits, limitations, imponderables and alternatives regarding both EGD and colonoscopy have been reviewed with the patient. Questions have been answered. All parties agreeable.

## 2016-03-19 NOTE — Anesthesia Postprocedure Evaluation (Signed)
Anesthesia Post Note  Patient: Rhonda Garcia  Procedure(s) Performed: Procedure(s) (LRB): COLONOSCOPY WITH PROPOFOL (N/A) ESOPHAGOGASTRODUODENOSCOPY (EGD) WITH PROPOFOL (N/A) MALONEY DILATION (N/A)  Patient location during evaluation: PACU Anesthesia Type: MAC Level of consciousness: awake and alert and oriented Pain management: pain level controlled Vital Signs Assessment: post-procedure vital signs reviewed and stable Respiratory status: spontaneous breathing Cardiovascular status: stable Postop Assessment: no signs of nausea or vomiting Anesthetic complications: no    Last Vitals:  Vitals:   03/19/16 0830 03/19/16 0835  BP: (!) 143/67 (!) 156/64  Pulse:    Resp: (!) 25 (!) 23  Temp:      Last Pain:  Vitals:   03/19/16 0747  TempSrc: Oral                 ADAMS, AMY A

## 2016-03-19 NOTE — Anesthesia Preprocedure Evaluation (Signed)
Anesthesia Evaluation  Patient identified by MRN, date of birth, ID band Patient awake    Reviewed: Allergy & Precautions, H&P , NPO status , Patient's Chart, lab work & pertinent test results, reviewed documented beta blocker date and time   Airway Mallampati: I  TM Distance: >3 FB Neck ROM: Full    Dental  (+) Edentulous Upper   Pulmonary sleep apnea , former smoker,    breath sounds clear to auscultation       Cardiovascular hypertension, Pt. on medications and Pt. on home beta blockers + CAD, + Past MI and + Cardiac Stents  + dysrhythmias Atrial Fibrillation  Rhythm:Regular Rate:Normal     Neuro/Psych  Headaches, PSYCHIATRIC DISORDERS Depression  Neuromuscular disease    GI/Hepatic hiatal hernia, GERD  Medicated,  Endo/Other  diabetes, Well Controlled, Type 1Hypothyroidism   Renal/GU      Musculoskeletal negative musculoskeletal ROS (+)   Abdominal   Peds  Hematology negative hematology ROS (+)   Anesthesia Other Findings   Reproductive/Obstetrics                             Anesthesia Physical Anesthesia Plan  ASA: III  Anesthesia Plan: MAC   Post-op Pain Management:    Induction: Intravenous  Airway Management Planned: Simple Face Mask  Additional Equipment:   Intra-op Plan:   Post-operative Plan:   Informed Consent: I have reviewed the patients History and Physical, chart, labs and discussed the procedure including the risks, benefits and alternatives for the proposed anesthesia with the patient or authorized representative who has indicated his/her understanding and acceptance.     Plan Discussed with:   Anesthesia Plan Comments:         Anesthesia Quick Evaluation

## 2016-03-19 NOTE — Op Note (Addendum)
[facility] Patient Name: Rhonda Garcia Procedure Date: 03/19/2016 8:18 AM MRN: [id number] Date of Birth: May 06, 1940 Attending MD: Norvel Richards , MD CSN: [id number] Age: 75 Admit Type: Outpatient Procedure:                Upper GI endoscopy with Venia Minks dilation Indications:              Dysphagia Providers:                Norvel Richards, MD, Jeanann Lewandowsky. Sharon Seller, RN,                            Randa Spike, Technician, Aram Candela Referring MD:              Medicines:                Propofol per Anesthesia Complications:            No immediate complications. Estimated Blood Loss:     Estimated blood loss: none. Procedure:                Pre-Anesthesia Assessment:                           - Prior to the procedure, a History and Physical                            was performed, and patient medications and                            allergies were reviewed. The patient's tolerance of                            previous anesthesia was also reviewed. The risks                            and benefits of the procedure and the sedation                            options and risks were discussed with the patient.                            All questions were answered, and informed consent                            was obtained. Prior Anticoagulants: The patient                            last took Coumadin (warfarin) 4 days prior to the                            procedure. ASA Grade Assessment: III - A patient                            with severe systemic disease. After reviewing the  risks and benefits, the patient was deemed in                            satisfactory condition to undergo the procedure.                           After obtaining informed consent, the endoscope was                            passed under direct vision. Throughout the                            procedure, the patient's blood pressure, pulse, and                  oxygen saturations were monitored continuously. The                            [id number]) scope was introduced through the                            mouth, and advanced to the second part of duodenum.                            The upper GI endoscopy was accomplished without                            difficulty. The patient tolerated the procedure                            well. Scope In: 8:51:24 AM Scope Out: 8:57:24 AM Total Procedure Duration: 0 hours 6 minutes 0 seconds  Findings:      LA Grade A (one or more mucosal breaks less than 5 mm, not extending       between tops of 2 mucosal folds) esophagitis was found 36 to 37 cm from       the incisors. No Barrett's Seen.      The entire examined stomach was normal.      The duodenal bulb and second portion of the duodenum were normal. The       scope was withdrawn. Dilation was performed with a Maloney dilator with       no resistance at 39 Fr. The dilation site was examined following       endoscope reinsertion and showed no change. Estimated blood loss: none. Impression:               - LA Grade A esophagitis.                           - Normal stomach.                           - Normal duodenal bulb and second portion of the                            duodenum.                           -  No specimens collected. Moderate Sedation:      Moderate (conscious) sedation was personally administered by an       anesthesia professional. The following parameters were monitored: oxygen       saturation, heart rate, blood pressure, respiratory rate, EKG, adequacy       of pulmonary ventilation, and response to care. Total physician       intraservice time was 15 minutes. Recommendation:           - Patient has a contact number available for                            emergencies. The signs and symptoms of potential                            delayed complications were discussed with the                             patient. Return to normal activities tomorrow.                            Written discharge instructions were provided to the                            patient.                           - Resume previous diet. Increase Protonix to 40 mg                            twice daily                           - Continue present medications. Resume Coumadin                            today. See colonoscopy report                           - No repeat upper endoscopy.                           - Return to GI office in 3 months. Procedure Code(s):        --- Professional ---                           [id number], Esophagogastroduodenoscopy, flexible,                            transoral; diagnostic, including collection of                            specimen(s) by brushing or washing, when performed                            (separate procedure)  43450, Dilation of esophagus, by unguided sound or                            bougie, single or multiple passes Diagnosis Code(s):        --- Professional ---                           K20.9, Esophagitis, unspecified                           R13.10, Dysphagia, unspecified CPT copyright 2016 American [employer] Association. All rights reserved. The codes documented in this report are preliminary and upon coder review may  be revised to meet current compliance requirements. Cristopher Estimable. Shalea Tomczak, MD Norvel Richards, MD 03/19/2016 9:20:17 AM This report has been signed electronically. Number of Addenda: 0

## 2016-03-21 ENCOUNTER — Encounter: Payer: Self-pay | Admitting: Pharmacist

## 2016-03-21 ENCOUNTER — Ambulatory Visit (INDEPENDENT_AMBULATORY_CARE_PROVIDER_SITE_OTHER): Payer: Medicare Other | Admitting: Pharmacist

## 2016-03-21 ENCOUNTER — Other Ambulatory Visit: Payer: Self-pay | Admitting: Family Medicine

## 2016-03-21 ENCOUNTER — Other Ambulatory Visit: Payer: Self-pay | Admitting: Family

## 2016-03-21 VITALS — BP 136/72 | HR 62 | Ht 62.0 in | Wt 197.0 lb

## 2016-03-21 DIAGNOSIS — Z0000 Encounter for general adult medical examination without abnormal findings: Secondary | ICD-10-CM | POA: Diagnosis not present

## 2016-03-21 DIAGNOSIS — I480 Paroxysmal atrial fibrillation: Secondary | ICD-10-CM | POA: Diagnosis not present

## 2016-03-21 DIAGNOSIS — E876 Hypokalemia: Secondary | ICD-10-CM | POA: Diagnosis not present

## 2016-03-21 DIAGNOSIS — Z7901 Long term (current) use of anticoagulants: Secondary | ICD-10-CM

## 2016-03-21 LAB — BMP8+EGFR
BUN/Creatinine Ratio: 13 (ref 12–28)
BUN: 10 mg/dL (ref 8–27)
CO2: 25 mmol/L (ref 18–29)
Calcium: 9.5 mg/dL (ref 8.7–10.3)
Chloride: 105 mmol/L (ref 96–106)
Creatinine, Ser: 0.8 mg/dL (ref 0.57–1.00)
GFR calc Af Amer: 84 mL/min/{1.73_m2} (ref >59)
GFR calc non Af Amer: 73 mL/min/{1.73_m2} (ref >59)
Glucose: 147 mg/dL — ABNORMAL HIGH (ref 65–99)
Potassium: 4.3 mmol/L (ref 3.5–5.2)
Sodium: 143 mmol/L (ref 134–144)

## 2016-03-21 LAB — COAGUCHEK XS/INR WAIVED
INR: 1.7 — ABNORMAL HIGH (ref 0.9–1.1)
Prothrombin Time: 19.9 s

## 2016-03-21 MED ORDER — PANTOPRAZOLE SODIUM 40 MG PO TBEC: 40.0000 mg | DELAYED_RELEASE_TABLET | Freq: Two times a day (BID) | ORAL | 1 refills | Status: DC

## 2016-03-21 NOTE — Telephone Encounter (Signed)
Last filled 03/21/16, last seen 01/10/16. Route to pool for call in

## 2016-03-21 NOTE — Telephone Encounter (Signed)
Xanax refill called in. 

## 2016-03-21 NOTE — Patient Instructions (Addendum)
[name] , Thank you for taking time to come for your Medicare Wellness Visit. I appreciate your ongoing commitment to your health goals. Please review the following plan we discussed and let me know if I can assist you in the future.   These are the goals we discussed:  Continue to dance - great exercise.   Make appointment with for diabetic eye exam - past due.   Increase non-starchy vegetables - carrots, green bean, squash, zucchini, tomatoes, onions, peppers, spinach and other green leafy vegetables, cabbage, lettuce, cucumbers, asparagus, okra (not fried), eggplant Limit sugar and processed foods (cakes, cookies, ice cream, crackers and chips) Increase fresh fruit but limit serving sizes 1/2 cup or about the size of tennis or baseball Limit red meat to no more than 1-2 times per week (serving size about the size of your palm) Choose whole grains / lean proteins - whole wheat bread, quinoa, whole grain rice (1/2 cup), fish, chicken, [place] Avoid sugar and calorie containing beverages - soda, sweet tea and juice.  Choose water or unsweetened tea instead.   This is a list of the screening recommended for you and due dates:  Health Maintenance  Topic Date Due  . Shingles Vaccine  06/25/2001  . Hemoglobin A1C  02/04/2016  . Eye exam for diabetics  04/28/2016*  . Complete foot exam   06/13/2016  . Mammogram  07/31/2017  . DEXA scan (bone density measurement)  09/08/2017  . Colon Cancer Screening  03/20/2019  . Tetanus Vaccine  12/19/2022  . Flu Shot  Addressed  . Pneumonia vaccines  Completed  *Topic was postponed. The date shown is not the original due date.    Health Maintenance for Postmenopausal Women Introduction Menopause is a normal process in which your reproductive ability comes to an end. This process happens gradually over a span of months to years, usually between the ages of 5 and 74. Menopause is complete when you have missed 12 consecutive menstrual periods. It  is important to talk with your health care provider about some of the most common conditions that affect postmenopausal women, such as heart disease, cancer, and bone loss (osteoporosis). Adopting a healthy lifestyle and getting preventive care can help to promote your health and wellness. Those actions can also lower your chances of developing some of these common conditions. What should I know about menopause? During menopause, you may experience a number of symptoms, such as:  Moderate-to-severe hot flashes.  Night sweats.  Decrease in sex drive.  Mood swings.  Headaches.  Tiredness.  Irritability.  Memory problems.  Insomnia. Choosing to treat or not to treat menopausal changes is an individual decision that you make with your health care provider. What should I know about hormone replacement therapy and supplements? Hormone therapy products are effective for treating symptoms that are associated with menopause, such as hot flashes and night sweats. Hormone replacement carries certain risks, especially as you become older. If you are thinking about using estrogen or estrogen with progestin treatments, discuss the benefits and risks with your health care provider. What should I know about heart disease and stroke? Heart disease, heart attack, and stroke become more likely as you age. This may be due, in part, to the hormonal changes that your body experiences during menopause. These can affect how your body processes dietary fats, triglycerides, and cholesterol. Heart attack and stroke are both medical emergencies. There are many things that you can do to help prevent heart disease and stroke:  Have  your blood pressure checked at least every 1-2 years. High blood pressure causes heart disease and increases the risk of stroke.  If you are 74-36 years old, ask your health care provider if you should take aspirin to prevent a heart attack or a stroke.  Do not use any tobacco products,  including cigarettes, chewing tobacco, or electronic cigarettes. If you need help quitting, ask your health care provider.  It is important to eat a healthy diet and maintain a healthy weight.  Be sure to include plenty of vegetables, fruits, low-fat dairy products, and lean protein.  Avoid eating foods that are high in solid fats, added sugars, or salt (sodium).  Get regular exercise. This is one of the most important things that you can do for your health.  Try to exercise for at least 150 minutes each week. The type of exercise that you do should increase your heart rate and make you sweat. This is known as moderate-intensity exercise.  Try to do strengthening exercises at least twice each week. Do these in addition to the moderate-intensity exercise.  Know your numbers.Ask your health care provider to check your cholesterol and your blood glucose. Continue to have your blood tested as directed by your health care provider. What should I know about cancer screening? There are several types of cancer. Take the following steps to reduce your risk and to catch any cancer development as early as possible. Breast Cancer  Practice breast self-awareness.  This means understanding how your breasts normally appear and feel.  It also means doing regular breast self-exams. Let your health care provider know about any changes, no matter how small.  If you are 74 or older, have a clinician do a breast exam (clinical breast exam or CBE) every year. (clinical breast exam or CBE) every year. Depending on your age, family history, and medical history, it may be recommended that you also have a yearly breast X-ray (mammogram).  If you have a family history of breast cancer, talk with your health care provider about genetic screening.  If you are at high risk for breast cancer, talk with your health care provider about having an MRI and a mammogram every year.  Breast cancer (BRCA) gene test is recommended for women who have family members  with BRCA-related cancers. Results of the assessment will determine the need for genetic counseling and BRCA1 and for BRCA2 testing. BRCA-related cancers include these types:  Breast. This occurs in males or females.  Ovarian.  Tubal. This may also be called fallopian tube cancer.  Cancer of the abdominal or pelvic lining (peritoneal cancer).  Prostate.  Pancreatic. Cervical, Uterine, and Ovarian Cancer  Your health care provider may recommend that you be screened regularly for cancer of the pelvic organs. These include your ovaries, uterus, and vagina. This screening involves a pelvic exam, which includes checking for microscopic changes to the surface of your cervix (Pap test).  For women ages 21-65, health care providers may recommend a pelvic exam and a Pap test every three years. For women ages 81-65, they may recommend the Pap test and pelvic exam, combined with testing for human papilloma virus (HPV), every five years. Some types of HPV increase your risk of cervical cancer. Testing for HPV may also be done on women of any age who have unclear Pap test results.  Other health care providers may not recommend any screening for nonpregnant women who are considered low risk for pelvic cancer and have no symptoms. Ask your health care provider if a screening  pelvic exam is right for you.  If you have had past treatment for cervical cancer or a condition that could lead to cancer, you need Pap tests and screening for cancer for at least 20 years after your treatment. If Pap tests have been discontinued for you, your risk factors (such as having a new sexual partner) need to be reassessed to determine if you should start having screenings again. Some women have medical problems that increase the chance of getting cervical cancer. In these cases, your health care provider may recommend that you have screening and Pap tests more often.  If you have a family history of uterine cancer or ovarian  cancer, talk with your health care provider about genetic screening.  If you have vaginal bleeding after reaching menopause, tell your health care provider.  There are currently no reliable tests available to screen for ovarian cancer. Lung Cancer  Lung cancer screening is recommended for adults 81-19 years old who are at high risk for lung cancer because of a history of smoking. A yearly low-dose CT scan of the lungs is recommended if you:  Currently smoke.  Have a history of at least 30 pack-years of smoking and you currently smoke or have quit within the past 15 years. A pack-year is smoking an average of one pack of cigarettes per day for one year. Yearly screening should:  Continue until it has been 15 years since you quit.  Stop if you develop a health problem that would prevent you from having lung cancer treatment. Colorectal Cancer  This type of cancer can be detected and can often be prevented.  Routine colorectal cancer screening usually begins at age 79 and continues through age 45.  If you have risk factors for colon cancer, your health care provider may recommend that you be screened at an earlier age.  If you have a family history of colorectal cancer, talk with your health care provider about genetic screening.  Your health care provider may also recommend using home test kits to check for hidden blood in your stool.  A small camera at the end of a tube can be used to examine your colon directly (sigmoidoscopy or colonoscopy). This is done to check for the earliest forms of colorectal cancer.  Direct examination of the colon should be repeated every 5-10 years until age 5. However, if early forms of precancerous polyps or small growths are found or if you have a family history or genetic risk for colorectal cancer, you may need to be screened more often. Skin Cancer  Check your skin from head to toe regularly.  Monitor any moles. Be sure to tell your health care  provider:  About any new moles or changes in moles, especially if there is a change in a mole's shape or color.  If you have a mole that is larger than the size of a pencil eraser.  If any of your family members has a history of skin cancer, especially at a young age, talk with your health care provider about genetic screening.  Always use sunscreen. Apply sunscreen liberally and repeatedly throughout the day.  Whenever you are outside, protect yourself by wearing long sleeves, pants, a wide-brimmed hat, and sunglasses. What should I know about osteoporosis? Osteoporosis is a condition in which bone destruction happens more quickly than new bone creation. After menopause, you may be at an increased risk for osteoporosis. To help prevent osteoporosis or the bone fractures that can happen because of osteoporosis,  the following is recommended:  If you are 59-68 years old, get at least 1,000 mg of calcium and at least 600 mg of vitamin D per day.  If you are older than age 51 but younger than age 51, get at least 1,200 mg of calcium and at least 600 mg of vitamin D per day.  If you are older than age 53, get at least 1,200 mg of calcium and at least 800 mg of vitamin D per day. Smoking and excessive alcohol intake increase the risk of osteoporosis. Eat foods that are rich in calcium and vitamin D, and do weight-bearing exercises several times each week as directed by your health care provider. What should I know about how menopause affects my mental health? Depression may occur at any age, but it is more common as you become older. Common symptoms of depression include:  Low or sad mood.  Changes in sleep patterns.  Changes in appetite or eating patterns.  Feeling an overall lack of motivation or enjoyment of activities that you previously enjoyed.  Frequent crying spells. Talk with your health care provider if you think that you are experiencing depression. What should I know about  immunizations? It is important that you get and maintain your immunizations. These include:  Tetanus, diphtheria, and pertussis (Tdap) booster vaccine.  Influenza every year before the flu season begins.  Pneumonia vaccine.  Shingles vaccine. Your health care provider may also recommend other immunizations. This information is not intended to replace advice given to you by your health care provider. Make sure you discuss any questions you have with your health care provider. Document Released: 05/18/2005 Document Revised: 10/14/2015 Document Reviewed: 12/28/2014  2017 [employer]

## 2016-03-21 NOTE — Progress Notes (Signed)
Patient ID: Rhonda Garcia, female   DOB: 1940-05-22, 75 y.o.   MRN: [id number]    Subjective:   Rhonda Garcia is a 75 y.o. female who presents for a subsequent Medicare Annual Wellness Visit.  Patient is widowed for about 3 years.  Seeing someone special currently.   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  She continues to dance about 3 days per week and is there for about 3 hours.   Rhonda Garcia had colonoscopy and endoscopy 03/19/2016. Lab from hospital 03/14/16 that were check prior to prior to procedure showed hypokalemia and hypocalcemia.  A little nausea this am but no vomiting.  Occasional cramping in feet and legs reported.  Current Medications (verified) Outpatient Encounter Prescriptions as of 03/21/2016  Medication Sig  . ALPRAZolam (XANAX) 0.5 MG tablet Take 0.5 mg by mouth at bedtime as needed for anxiety.  Marland Kitchen amLODipine (NORVASC) 10 MG tablet Take 1 tablet (10 mg total) by mouth daily.  Marland Kitchen aspirin EC 81 MG EC tablet Take 1 tablet (81 mg total) by mouth daily.  Marland Kitchen escitalopram (LEXAPRO) 10 MG tablet Take 1 tablet (10 mg total) by mouth daily.  . isosorbide mononitrate (IMDUR) 60 MG 24 hr tablet Take 1 tablet (60 mg total) by mouth daily.  Marland Kitchen levothyroxine (LEVOTHROID) 25 MCG tablet Take 1 tablet (25 mcg total) by mouth daily before breakfast.  . Linaclotide (LINZESS) 145 MCG CAPS capsule Take 1 capsule (145 mcg total) by mouth daily.  Marland Kitchen lisinopril-hydrochlorothiazide (PRINZIDE,ZESTORETIC) 20-12.5 MG per tablet Take 2 tablets by mouth daily.  . metFORMIN (GLUCOPHAGE) 500 MG tablet TAKE TWO TABLETS BY MOUTH EVERY MORNING (Patient taking differently: No sig reported)  . metoprolol (LOPRESSOR) 50 MG tablet TAKE ONE TABLET BY MOUTH TWICE DAILY  . nitroGLYCERIN (NITROSTAT) 0.4 MG SL tablet Place 0.4 mg under the tongue every 5 (five) minutes as needed for chest pain.   . ranitidine (ZANTAC) 150 MG tablet  Take 1 tablet (150 mg total) by mouth 2 (two) times daily.  Marland Kitchen spironolactone (ALDACTONE) 25 MG tablet Take 1 tablet (25 mg total) by mouth daily.  . traMADol (ULTRAM) 50 MG tablet Take 1 tablet (50 mg total) by mouth every 12 (twelve) hours as needed. (Patient taking differently: Take 50 mg by mouth every 12 (twelve) hours as needed for moderate pain. )  . warfarin (COUMADIN) 5 MG tablet Take 1 and 1/2 tablets on Wednesdays.  Take 1 tablet all other days.  . [DISCONTINUED] sertraline (ZOLOFT) 100 MG tablet Take 100 mg by mouth daily as needed.   . [DISCONTINUED] amoxicillin-clavulanate (AUGMENTIN) 875-125 MG tablet Take 1 tablet by mouth 2 (two) times daily.   No facility-administered encounter medications on file as of 03/21/2016.     Allergies (verified) Promethazine hcl   History: Past Medical History:  Diagnosis Date  . A-fib ([place])   . Anal fissure    resolved   . Anemia   . Back pain    with left radiculopathy  . CAD (coronary artery disease)   . CAD (coronary artery disease)   . Cataract   . DDD (degenerative disc disease)   . Depression   . Diabetes mellitus   . Diverticulosis   . Dyslipidemia   . Dysrhythmia    AFib  . Esophagitis   . Generalized headaches   . GERD (gastroesophageal reflux disease)   . Hearing loss   .  Hiatal hernia   . Hx of peptic ulcer   . Hyperlipidemia   . Hypertension   . Hypothyroidism   . Internal hemorrhoids 2017  . NSTEMI (non-ST elevated myocardial infarction) ([place]) 08/05/2015  . Osteopenia   . Sebaceous cyst    on back  . Sleep apnea    not using CPAP now, could not tolerate and PCP aware.  . Thyroid disease    Past Surgical History:  Procedure Laterality Date  . ABDOMINAL HYSTERECTOMY Bilateral 2001 - approximate  . APPENDECTOMY    . BREAST REDUCTION SURGERY    . CARDIAC CATHETERIZATION N/A 08/08/2015   Procedure: Left Heart Cath and Coronary Angiography;  Surgeon: Burnell Blanks, MD;  Location: [facility];   Service: Cardiovascular;  Laterality: N/A;  . CHOLECYSTECTOMY  2008 - approximate  . COLONOSCOPY  06/2007   Friable anal canal and anal papillae. Pancolonic diverticula. Normal terminal ileum. Status post segmental biopsy, descending colon biopsy showed minimal cryptitis. Biopsies felt to be  nonspecific but could be seen with NSAIDs. No features of inflammatory bowel disease. Stool studies were negative.  . COLONOSCOPY  03/21/2011   RMR: colonic polyps treated as described above, colonic diverticulosis (Tubular adenomas)  . COLONOSCOPY WITH PROPOFOL N/A 06/17/2014   RMR: Colonic diverticulosis. Multiple colonic polyps removed as described above.   . CORONARY ANGIOPLASTY WITH STENT PLACEMENT     4 stents  . ESOPHAGEAL DILATION N/A 06/17/2014   Procedure: ESOPHAGEAL DILATION;  Surgeon: Daneil Dolin, MD;  Location: [facility];  Service: Endoscopy;  Laterality: N/A;  Maloney 16 Fr  . ESOPHAGOGASTRODUODENOSCOPY  06/2007   Small hiatal hernia  . ESOPHAGOGASTRODUODENOSCOPY  03/21/2011   RMR: normal esophagus- status post passage of Maloney dilator. Small hiatal hernia. Trivial antral and bulbar erosions  . ESOPHAGOGASTRODUODENOSCOPY (EGD) WITH PROPOFOL N/A 06/17/2014   RMR: Small hiatal hernia; otherwise normal EGD. Status post passage of a Maloney dilator. No explanation for patients right upper quadrant abdominal pain.   Marland Kitchen KNEE SURGERY Left    arthroscopy  . LIPOMA EXCISION  03/17/2012   Procedure: EXCISION LIPOMA;  Surgeon: Gwenyth Ober, MD;  Location: [facility];  Service: General;  Laterality: Right;  excision of right lower back lipoma  . POLYPECTOMY  06/17/2014   Procedure: POLYPECTOMY;  Surgeon: Daneil Dolin, MD;  Location: [facility];  Service: Endoscopy;;   Family History  Problem Relation Age of Onset  . Heart attack Mother   . Hypertension Mother   . Stroke Mother     Deceased, age 95  . Diabetes Sister   . Diabetes Sister   . Cancer Sister     ovarian  . Stroke Sister     . Diabetes Brother     also has a blood disorder   . Clotting disorder Brother   . Kidney disease Brother   . Heart disease Brother   . Melanoma Brother     deceased  . Coronary artery disease Other   . Kidney disease Other   . Colon cancer Neg Hx    Social History   Occupational History  . Retired    Social History Main Topics  . Smoking status: Former Smoker    Packs/day: 0.50    Years: 5.00    Types: Cigarettes    Quit date: 03/13/1981  . Smokeless tobacco: Never Used     Comment: quit 30 +years ago  . Alcohol use 0.6 oz/week    1 Glasses of wine  per week  . Drug use: No  . Sexual activity: Yes    Birth control/ protection: Post-menopausal    Do you feel safe at home?  Yes Are there smokers in your home (other than you)? No  Dietary issues and exercise activities: Current Exercise Habits: Home exercise routine, Type of exercise: Other - see comments (dancing), Time (Minutes): > 60, Frequency (Times/Week): 3, Weekly Exercise (Minutes/Week): 0, Intensity: Mild  Current Dietary habits:  She has stopped drinking sodas. More water and lemonade.  She has also decreased bread intake. She eat 2 to 3 meals a day.    Objective:    Today's Vitals   03/21/16 1031 03/21/16 1041  BP: (!) 144/82 136/72  Pulse: 62   Weight: 197 lb (89.4 kg)   Height: 5' 2" (1.575 m)   PainSc: 0-No pain    Body mass index is 36.03 kg/m.   INR was 1.7 today   Activities of Daily Living In your present state of health, do you have any difficulty performing the following activities: 03/21/2016 03/14/2016  Hearing? Tempie Donning  Vision? Y Y  Difficulty concentrating or making decisions? N N  Walking or climbing stairs? N N  Dressing or bathing? N N  Doing errands, shopping? N N  Preparing Food and eating ? N -  Using the Toilet? N -  In the past six months, have you accidently leaked urine? N -  Do you have problems with loss of bowel control? N -  Managing your Medications? N -  Managing your  Finances? N -  Housekeeping or managing your Housekeeping? N -  Some recent data might be hidden     Cardiac Risk Factors include: advanced age (>95mn, >>50women);diabetes mellitus;family history of premature cardiovascular disease;hypertension;obesity (BMI >30kg/m2)  Depression Screen PHQ 2/9 Scores 03/21/2016 01/10/2016 10/27/2015 10/05/2015  PHQ - 2 Score 0 0 2 2  PHQ- 9 Score - - 2 2     Fall Risk Fall Risk  03/21/2016 01/10/2016 10/27/2015 10/05/2015 06/14/2015  Falls in the past year? No No Yes Yes No  Number falls in past yr: - - 1 1 -  Injury with Fall? - - Yes Yes -  Risk Factor Category  - - - High Fall Risk -  Follow up - - - - -    Cognitive Function: MMSE - Mini Mental State Exam 03/21/2016 03/21/2015  Orientation to time 5 3  Orientation to Place 5 5  Registration 3 3  Attention/ Calculation 2 2  Recall 2 3  Language- name 2 objects 2 2  Language- repeat 1 1  Language- follow 3 step command 3 3  Language- read & follow direction 1 1  Write a sentence 1 1  Copy design 1 0  Total score 26 24    Immunizations and Health Maintenance Immunization History  Administered Date(s) Administered  . Influenza, High Dose Seasonal PF 02/07/2015  . Influenza,inj,Quad PF,36+ Mos 01/19/2013, 05/20/2014, 01/10/2016  . Pneumococcal Conjugate-13 08/13/2013  . Pneumococcal Polysaccharide-23 02/07/2015  . Tdap 12/18/2012   Health Maintenance Due  Topic Date Due  . ZOSTAVAX  06/25/2001  . HEMOGLOBIN A1C  02/04/2016    Patient Care Team: CSharion Balloon FNP as PCP - General (Nurse Practitioner) RDaneil Dolin MD (Gastroenterology) JMinus Breeding MD as Consulting Physician (Cardiology) CRaeanne Gathers AUD as Audiologist (Audiology) Truc LManus Gunning OD as Consulting Physician (Optometry)  Indicate any recent Medical Services you may have received from other than [facility] providers in  the past year (date may be approximate).    Assessment:    Annual Wellness Visit  Sub  therapeutic anticoagualtion hypokalemia   Screening Tests Health Maintenance  Topic Date Due  . ZOSTAVAX  06/25/2001  . HEMOGLOBIN A1C  02/04/2016  . OPHTHALMOLOGY EXAM  04/28/2016 (Originally 03/09/2015)  . FOOT EXAM  06/13/2016  . MAMMOGRAM  07/31/2017  . DEXA SCAN  09/08/2017  . COLONOSCOPY  03/20/2019  . TETANUS/TDAP  12/19/2022  . INFLUENZA VACCINE  Addressed  . PNA vac Low Risk Adult  Completed        Plan:   During the course of the visit Rhonda Garcia was educated and counseled about the following appropriate screening and preventive services:   Vaccines to include Pneumoccal, Influenza, Td, Zostavax - vaccines are UTD excpet Zostavax.  Checked cost to patient which would be over $200.  Patient will postpone Zostavax.   Colorectal cancer screening - colonoscopy is UTD  Endoscopy last 03/19/2016  Recommend stop rantidine and start pantoprazole 68m bid per Dr RGuerry Minorsnotes  Cardiovascular disease screening - EKG and ECHO are both UTD  BP was good today; lipids last checked 10/2015 and were at goal  Bone Denisty / Osteoporosis Screening - UTD  Mammogram - UTD  PAP - no longer required  Glaucoma screening / Diabetic Eye Exam - reminded patient that eye appt is due and importance of yearly eye exams for diabetics  Nutrition counseling - continue to limit foods high in carbs.    Discussed weight goals and BMI  Advanced Directives - pt declined  Physical Activity - continue to dance for exercise. Great job.  Verified with [facility] that patient has not been taking sertraline and removed from medication list.    Discussed high risk meds - tramadol and alprazolam.  Patient states she does not take either of these daily.  She is use sparingly.   Fall prevention discussed.   Anticoagulation Dose Instructions as of 03/21/2016      SDorene GrebeTue Wed Thu Fri Sat   New Dose 7.5 mg 5 mg 7.5 mg 5 mg 7.5 mg 5 mg 7.5 mg    Description   Take 1 and 1/2 tablets today -  Wednesday, December 13th.  Then continue current warfarin 571mdose - Take 1 tablet Mondays, Wednesdays and Fridays.  Take 1 and 1/2 tablets all other days.        Orders Placed This Encounter  Procedures  . BMP8+EGFR    Patient Instructions (the written plan) were given to the patient.   TaCherre RobinsPharmD   03/21/2016

## 2016-03-22 ENCOUNTER — Encounter (HOSPITAL_COMMUNITY): Payer: Self-pay | Admitting: Internal Medicine

## 2016-04-05 ENCOUNTER — Ambulatory Visit: Payer: Self-pay | Admitting: Pharmacist

## 2016-04-17 ENCOUNTER — Encounter: Payer: Self-pay | Admitting: Family

## 2016-04-17 ENCOUNTER — Ambulatory Visit (INDEPENDENT_AMBULATORY_CARE_PROVIDER_SITE_OTHER): Payer: Medicare Other | Admitting: Family

## 2016-04-17 VITALS — BP 128/67 | HR 61 | Temp 98.1°F | Ht 62.0 in | Wt 195.0 lb

## 2016-04-17 DIAGNOSIS — E785 Hyperlipidemia, unspecified: Secondary | ICD-10-CM

## 2016-04-17 DIAGNOSIS — Z7901 Long term (current) use of anticoagulants: Secondary | ICD-10-CM

## 2016-04-17 DIAGNOSIS — K219 Gastro-esophageal reflux disease without esophagitis: Secondary | ICD-10-CM

## 2016-04-17 DIAGNOSIS — I480 Paroxysmal atrial fibrillation: Secondary | ICD-10-CM | POA: Diagnosis not present

## 2016-04-17 DIAGNOSIS — E559 Vitamin D deficiency, unspecified: Secondary | ICD-10-CM

## 2016-04-17 DIAGNOSIS — F411 Generalized anxiety disorder: Secondary | ICD-10-CM | POA: Diagnosis not present

## 2016-04-17 DIAGNOSIS — E119 Type 2 diabetes mellitus without complications: Secondary | ICD-10-CM | POA: Diagnosis not present

## 2016-04-17 DIAGNOSIS — K5900 Constipation, unspecified: Secondary | ICD-10-CM

## 2016-04-17 DIAGNOSIS — M1990 Unspecified osteoarthritis, unspecified site: Secondary | ICD-10-CM

## 2016-04-17 DIAGNOSIS — I25110 Atherosclerotic heart disease of native coronary artery with unstable angina pectoris: Secondary | ICD-10-CM

## 2016-04-17 DIAGNOSIS — I10 Essential (primary) hypertension: Secondary | ICD-10-CM

## 2016-04-17 DIAGNOSIS — F331 Major depressive disorder, recurrent, moderate: Secondary | ICD-10-CM

## 2016-04-17 DIAGNOSIS — E039 Hypothyroidism, unspecified: Secondary | ICD-10-CM

## 2016-04-17 LAB — COAGUCHEK XS/INR WAIVED
INR: 2.4 — ABNORMAL HIGH (ref 0.9–1.1)
Prothrombin Time: 29 s

## 2016-04-17 LAB — BAYER DCA HB A1C WAIVED: HB A1C (BAYER DCA - WAIVED): 7.8 % — ABNORMAL HIGH (ref <7.0)

## 2016-04-17 NOTE — Progress Notes (Signed)
Subjective:    Patient ID: Rhonda Garcia, female    DOB: 04/08/1941, 76 y.o.   MRN: [id number]  Pt presents to the office today for chronic follow up.  Diabetes  She presents for her follow-up diabetic visit. She has type 2 diabetes mellitus. Her disease course has been fluctuating. Hypoglycemia symptoms include nervousness/anxiousness. Pertinent negatives for hypoglycemia include no confusion, dizziness or headaches. Associated symptoms include visual change ("small"). Pertinent negatives for diabetes include no fatigue, no foot paresthesias, no foot ulcerations and no polyphagia. There are no hypoglycemic complications. Symptoms are stable. Diabetic complications include heart disease and nephropathy. Pertinent negatives for diabetic complications include no CVA or peripheral neuropathy. Risk factors for coronary artery disease include dyslipidemia, family history, hypertension, post-menopausal, stress and diabetes mellitus. Current diabetic treatment includes oral agent (dual therapy). She is compliant with treatment all of the time. She is following a generally unhealthy diet. She participates in exercise weekly. (PT states she does  Not take her blood sugars at home  ) An ACE inhibitor/angiotensin II receptor blocker is being taken. Eye exam is current.  Hypertension  This is a chronic problem. The current episode started more than 1 year ago. The problem has been resolved since onset. The problem is controlled. Associated symptoms include anxiety and malaise/fatigue. Pertinent negatives include no headaches, palpitations, peripheral edema or shortness of breath. Risk factors for coronary artery disease include diabetes mellitus, dyslipidemia, family history, obesity and post-menopausal state. Past treatments include ACE inhibitors, diuretics, calcium channel blockers and beta blockers. The current treatment provides moderate improvement. Hypertensive end-organ damage includes CAD/MI. There is no  history of kidney disease, CVA, heart failure or a thyroid problem. Identifiable causes of hypertension include sleep apnea.  Hyperlipidemia  This is a chronic problem. The current episode started more than 1 year ago. The problem is controlled. Recent lipid tests were reviewed and are normal. Exacerbating diseases include diabetes and obesity. She has no history of hypothyroidism. Pertinent negatives include no leg pain or shortness of breath. Current antihyperlipidemic treatment includes statins. The current treatment provides moderate improvement of lipids. Risk factors for coronary artery disease include diabetes mellitus, dyslipidemia, family history, hypertension, obesity, post-menopausal and a sedentary lifestyle.  Gastroesophageal Reflux  She reports no belching, no heartburn or no nausea. This is a chronic problem. The current episode started more than 1 year ago. The problem occurs rarely. The problem has been resolved. The symptoms are aggravated by certain foods. Pertinent negatives include no fatigue. She has tried a PPI and a diet change for the symptoms. The treatment provided significant relief.  Anxiety  Presents for follow-up visit. Onset was 1 to 6 months ago. The problem has been rapidly worsening. Symptoms include depressed mood, excessive worry, insomnia and nervous/anxious behavior. Patient reports no confusion, dizziness, irritability, nausea, palpitations or shortness of breath. Symptoms occur occasionally. The severity of symptoms is mild. The symptoms are aggravated by family issues and work stress.   Her past medical history is significant for anxiety/panic attacks and depression. There is no history of CAD. Past treatments include SSRIs.  Constipation  This is a chronic problem. The current episode started more than 1 year ago. The problem is unchanged. Her stool frequency is 4 to 5 times per week. The stool is described as firm. Associated symptoms include bloating. Pertinent  negatives include no hematochezia or nausea. Treatments tried: linzess. The treatment provided moderate relief.  Thyroid Problem  Presents for follow-up visit. Symptoms include anxiety, constipation, depressed mood and visual  change ("small"). Patient reports no fatigue or palpitations. The symptoms have been improving. Past treatments include levothyroxine. The treatment provided moderate relief. Her past medical history is significant for diabetes and hyperlipidemia. There is no history of heart failure.  Arthritis  Presents for follow-up visit. She complains of pain and joint warmth. Affected locations include the left MCP and right MCP (bilateral hip). Her pain is at a severity of 8/10. Pertinent negatives include no fatigue. Her past medical history is significant for osteoarthritis. Her pertinent risk factors include overuse. Past treatments include rest, NSAIDs and acetaminophen. The treatment provided moderate relief.  A Fib :Pt currently taking warfarin 5 mg daily. Pt states this is stable.     Review of Systems  Constitutional: Positive for malaise/fatigue. Negative for fatigue and irritability.  HENT: Negative.   Eyes: Negative.   Respiratory: Negative.  Negative for shortness of breath.   Cardiovascular: Negative.  Negative for palpitations.  Gastrointestinal: Positive for bloating and constipation. Negative for heartburn, hematochezia and nausea.  Endocrine: Negative.  Negative for polyphagia.  Genitourinary: Negative.   Musculoskeletal: Positive for arthritis.  Neurological: Negative.  Negative for dizziness and headaches.  Hematological: Negative.   Psychiatric/Behavioral: Negative for confusion. The patient is nervous/anxious and has insomnia.   All other systems reviewed and are negative.      Objective:   Physical Exam  Constitutional: She is oriented to person, place, and time. She appears well-developed and well-nourished. No distress.  HENT:  Head: Normocephalic  and atraumatic.  Right Ear: External ear normal.  Left Ear: External ear normal.  Nose: Nose normal.  Mouth/Throat: Oropharynx is clear and moist.  Eyes: Pupils are equal, round, and reactive to light.  Neck: Normal range of motion. Neck supple. No thyromegaly present.  Cardiovascular: Normal rate, regular rhythm, normal heart sounds and intact distal pulses.   No murmur heard. Pulmonary/Chest: Effort normal and breath sounds normal. No respiratory distress. She has no wheezes.  Abdominal: Soft. Bowel sounds are normal. She exhibits no distension. There is no tenderness.  Musculoskeletal: Normal range of motion. She exhibits no edema or tenderness.  Neurological: She is alert and oriented to person, place, and time.  Skin: Skin is warm and dry.  Psychiatric: She has a normal mood and affect. Her behavior is normal. Judgment and thought content normal.  Vitals reviewed.     BP 128/67   Pulse 61   Temp 98.1 F (36.7 C) (Oral)   Ht 5' 2"  (1.575 m)   Wt 195 lb (88.5 kg)   BMI 35.67 kg/m      Assessment & Plan:  1. Paroxysmal atrial fibrillation (HCC) - CoaguChek XS/INR Waived - CMP14+EGFR  2. Long term current use of anticoagulant therapy - CoaguChek XS/INR Waived - CMP14+EGFR  3. Coronary artery disease involving native coronary artery of native heart with unstable angina pectoris (HCC)  - CMP14+EGFR  4. Essential hypertension  - CMP14+EGFR  5. Constipation, unspecified constipation type  - CMP14+EGFR  6. Gastroesophageal reflux disease without esophagitis  - CMP14+EGFR  7. Type 2 diabetes mellitus without complication, without long-term current use of insulin ([place])  - Bayer DCA Hb A1c Waived - CMP14+EGFR - Ambulatory referral to Ophthalmology  8. Hypothyroidism, unspecified type  - CMP14+EGFR - Thyroid Panel With TSH  9. Osteoarthritis, unspecified osteoarthritis type, unspecified site  - CMP14+EGFR  10. Moderate episode of recurrent major  depressive disorder (HCC)  - CMP14+EGFR  11. GAD (generalized anxiety disorder)  - CMP14+EGFR  12. Hyperlipidemia, unspecified hyperlipidemia  type  - CMP14+EGFR - Lipid panel  13. Vitamin D deficiency  - CMP14+EGFR   Continue all meds Labs pending Health Maintenance reviewed Diet and exercise encouraged RTO 3 months  Evelina Dun, FNP

## 2016-04-17 NOTE — Patient Instructions (Signed)
Diabetes Mellitus and Food It is important for you to manage your blood sugar (glucose) level. Your blood glucose level can be greatly affected by what you eat. Eating healthier foods in the appropriate amounts throughout the day at about the same time each day will help you control your blood glucose level. It can also help slow or prevent worsening of your diabetes mellitus. Healthy eating may even help you improve the level of your blood pressure and reach or maintain a healthy weight. General recommendations for healthful eating and cooking habits include:  Eating meals and snacks regularly. Avoid going long periods of time without eating to lose weight.  Eating a diet that consists mainly of plant-based foods, such as fruits, vegetables, nuts, legumes, and whole grains.  Using low-heat cooking methods, such as baking, instead of high-heat cooking methods, such as deep frying.  Work with your dietitian to make sure you understand how to use the Nutrition Facts information on food labels. How can food affect me? Carbohydrates Carbohydrates affect your blood glucose level more than any other type of food. Your dietitian will help you determine how many carbohydrates to eat at each meal and teach you how to count carbohydrates. Counting carbohydrates is important to keep your blood glucose at a healthy level, especially if you are using insulin or taking certain medicines for diabetes mellitus. Alcohol Alcohol can cause sudden decreases in blood glucose (hypoglycemia), especially if you use insulin or take certain medicines for diabetes mellitus. Hypoglycemia can be a life-threatening condition. Symptoms of hypoglycemia (sleepiness, dizziness, and disorientation) are similar to symptoms of having too much alcohol. If your health care provider has given you approval to drink alcohol, do so in moderation and use the following guidelines:  Women should not have more than one drink per day, and men  should not have more than two drinks per day. One drink is equal to: ? 12 oz of beer. ? 5 oz of wine. ? 1 oz of hard liquor.  Do not drink on an empty stomach.  Keep yourself hydrated. Have water, diet soda, or unsweetened iced tea.  Regular soda, juice, and other mixers might contain a lot of carbohydrates and should be counted.  What foods are not recommended? As you make food choices, it is important to remember that all foods are not the same. Some foods have fewer nutrients per serving than other foods, even though they might have the same number of calories or carbohydrates. It is difficult to get your body what it needs when you eat foods with fewer nutrients. Examples of foods that you should avoid that are high in calories and carbohydrates but low in nutrients include:  Trans fats (most processed foods list trans fats on the Nutrition Facts label).  Regular soda.  Juice.  Candy.  Sweets, such as cake, pie, doughnuts, and cookies.  Fried foods.  What foods can I eat? Eat nutrient-rich foods, which will nourish your body and keep you healthy. The food you should eat also will depend on several factors, including:  The calories you need.  The medicines you take.  Your weight.  Your blood glucose level.  Your blood pressure level.  Your cholesterol level.  You should eat a variety of foods, including:  Protein. ? Lean cuts of meat. ? Proteins low in saturated fats, such as fish, egg whites, and beans. Avoid processed meats.  Fruits and vegetables. ? Fruits and vegetables that may help control blood glucose levels, such as apples,   mangoes, and yams.  Dairy products. ? Choose fat-free or low-fat dairy products, such as milk, yogurt, and cheese.  Grains, bread, pasta, and rice. ? Choose whole grain products, such as multigrain bread, whole oats, and brown rice. These foods may help control blood pressure.  Fats. ? Foods containing healthful fats, such as  nuts, avocado, olive oil, canola oil, and fish.  Does everyone with diabetes mellitus have the same meal plan? Because every person with diabetes mellitus is different, there is not one meal plan that works for everyone. It is very important that you meet with a dietitian who will help you create a meal plan that is just right for you. This information is not intended to replace advice given to you by your health care provider. Make sure you discuss any questions you have with your health care provider. Document Released: 12/21/2004 Document Revised: 09/01/2015 Document Reviewed: 02/20/2013 Elsevier Interactive Patient Education  2017 Elsevier Inc.  

## 2016-04-18 LAB — CMP14+EGFR
ALT: 18 IU/L (ref 0–32)
AST: 19 IU/L (ref 0–40)
Albumin/Globulin Ratio: 1.5 (ref 1.2–2.2)
Albumin: 3.8 g/dL (ref 3.5–4.8)
Alkaline Phosphatase: 91 IU/L (ref 39–117)
BUN/Creatinine Ratio: 15 (ref 12–28)
BUN: 14 mg/dL (ref 8–27)
Bilirubin Total: 0.5 mg/dL (ref 0.0–1.2)
CO2: 25 mmol/L (ref 18–29)
Calcium: 9.2 mg/dL (ref 8.7–10.3)
Chloride: 103 mmol/L (ref 96–106)
Creatinine, Ser: 0.94 mg/dL (ref 0.57–1.00)
GFR calc Af Amer: 69 mL/min/{1.73_m2} (ref >59)
GFR calc non Af Amer: 60 mL/min/{1.73_m2} (ref >59)
Globulin, Total: 2.5 g/dL (ref 1.5–4.5)
Glucose: 147 mg/dL — ABNORMAL HIGH (ref 65–99)
Potassium: 4.6 mmol/L (ref 3.5–5.2)
Sodium: 143 mmol/L (ref 134–144)
Total Protein: 6.3 g/dL (ref 6.0–8.5)

## 2016-04-18 LAB — LIPID PANEL
Chol/HDL Ratio: 3.5 ratio units (ref 0.0–4.4)
Cholesterol, Total: 152 mg/dL (ref 100–199)
HDL: 43 mg/dL (ref >39)
LDL Calculated: 88 mg/dL (ref 0–99)
Triglycerides: 103 mg/dL (ref 0–149)
VLDL Cholesterol Cal: 21 mg/dL (ref 5–40)

## 2016-04-18 LAB — THYROID PANEL WITH TSH
Free Thyroxine Index: 1.8 (ref 1.2–4.9)
T3 Uptake Ratio: 27 % (ref 24–39)
T4, Total: 6.8 ug/dL (ref 4.5–12.0)
TSH: 2.83 u[IU]/mL (ref 0.450–4.500)

## 2016-04-19 ENCOUNTER — Other Ambulatory Visit: Payer: Self-pay | Admitting: Family

## 2016-05-17 DIAGNOSIS — E119 Type 2 diabetes mellitus without complications: Secondary | ICD-10-CM | POA: Diagnosis not present

## 2016-05-17 DIAGNOSIS — H25813 Combined forms of age-related cataract, bilateral: Secondary | ICD-10-CM | POA: Diagnosis not present

## 2016-05-17 DIAGNOSIS — H43813 Vitreous degeneration, bilateral: Secondary | ICD-10-CM | POA: Diagnosis not present

## 2016-05-17 DIAGNOSIS — Z7984 Long term (current) use of oral hypoglycemic drugs: Secondary | ICD-10-CM | POA: Diagnosis not present

## 2016-05-17 LAB — HM DIABETES EYE EXAM

## 2016-05-22 DIAGNOSIS — E119 Type 2 diabetes mellitus without complications: Secondary | ICD-10-CM | POA: Diagnosis not present

## 2016-05-23 ENCOUNTER — Ambulatory Visit (INDEPENDENT_AMBULATORY_CARE_PROVIDER_SITE_OTHER): Payer: Medicare Other | Admitting: Pharmacist

## 2016-05-23 DIAGNOSIS — Z7901 Long term (current) use of anticoagulants: Secondary | ICD-10-CM | POA: Diagnosis not present

## 2016-05-23 DIAGNOSIS — I480 Paroxysmal atrial fibrillation: Secondary | ICD-10-CM | POA: Diagnosis not present

## 2016-05-23 LAB — COAGUCHEK XS/INR WAIVED
INR: 3.6 — ABNORMAL HIGH (ref 0.9–1.1)
Prothrombin Time: 43.6 s

## 2016-05-31 ENCOUNTER — Other Ambulatory Visit: Payer: Self-pay | Admitting: Family

## 2016-06-12 ENCOUNTER — Encounter: Payer: Self-pay | Admitting: Pharmacist

## 2016-06-13 ENCOUNTER — Encounter: Payer: Self-pay | Admitting: Family

## 2016-06-19 ENCOUNTER — Ambulatory Visit: Payer: Medicare Other | Admitting: Nurse Practitioner

## 2016-06-27 ENCOUNTER — Ambulatory Visit (INDEPENDENT_AMBULATORY_CARE_PROVIDER_SITE_OTHER): Payer: Medicare Other | Admitting: Physician Assistant

## 2016-06-27 ENCOUNTER — Encounter: Payer: Self-pay | Admitting: Physician Assistant

## 2016-06-27 ENCOUNTER — Ambulatory Visit: Payer: Medicare Other | Admitting: Nurse Practitioner

## 2016-06-27 VITALS — BP 169/68 | HR 64 | Temp 97.0°F | Ht 62.0 in | Wt 197.0 lb

## 2016-06-27 DIAGNOSIS — I480 Paroxysmal atrial fibrillation: Secondary | ICD-10-CM | POA: Diagnosis not present

## 2016-06-27 DIAGNOSIS — R300 Dysuria: Secondary | ICD-10-CM

## 2016-06-27 DIAGNOSIS — R42 Dizziness and giddiness: Secondary | ICD-10-CM | POA: Diagnosis not present

## 2016-06-27 DIAGNOSIS — Z7901 Long term (current) use of anticoagulants: Secondary | ICD-10-CM

## 2016-06-27 LAB — URINALYSIS, COMPLETE
Bilirubin, UA: NEGATIVE
Glucose, UA: NEGATIVE
Ketones, UA: NEGATIVE
Leukocytes, UA: NEGATIVE
Nitrite, UA: NEGATIVE
Protein, UA: NEGATIVE
RBC, UA: NEGATIVE
Specific Gravity, UA: 1.025 (ref 1.005–1.030)
Urobilinogen, Ur: 0.2 mg/dL (ref 0.2–1.0)
pH, UA: 5 (ref 5.0–7.5)

## 2016-06-27 LAB — COAGUCHEK XS/INR WAIVED
INR: 2 — ABNORMAL HIGH (ref 0.9–1.1)
Prothrombin Time: 24.3 s

## 2016-06-27 LAB — MICROSCOPIC EXAMINATION
Bacteria, UA: NONE SEEN
Epithelial Cells (non renal): >10 /hpf — AB (ref 0–10)
RBC, UA: NONE SEEN /hpf (ref 0–?)
Renal Epithel, UA: NONE SEEN /hpf
WBC, UA: NONE SEEN /hpf (ref 0–?)

## 2016-06-27 MED ORDER — MECLIZINE HCL 25 MG PO TABS: 25.0000 mg | ORAL_TABLET | Freq: Three times a day (TID) | ORAL | 1 refills | Status: DC | PRN

## 2016-06-27 NOTE — Patient Instructions (Signed)

## 2016-06-27 NOTE — Progress Notes (Signed)
BP (!) 169/68   Pulse 64   Temp 97 F (36.1 C) (Oral)   Ht 5\' 2"  (1.575 m)   Wt 197 lb (89.4 kg)   BMI 36.03 kg/m    Subjective:    Patient ID: Rhonda Garcia, female    DOB: 07/19/1940, 76 y.o.   MRN: [id number]  HPI: Rhonda Garcia is a 76 y.o. female presenting on 06/27/2016 for Dizziness (2 weeks) and Dysuria  This patient has had off and on symptoms of vertigo for the past 2 weeks. She has taken a little bit of over-the-counter Dramamine. 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 She had taken Antivert in the past. This is the longest episode for vertigo has ever lasted. She is quite nauseous when episode occurs. Positional changes affect her the most. We will plan to start some Antivert and if this does not improve in a couple of days she will give [place] a call back and we will plan neurologic evaluation. Next  Patient upon leaving discussed that she is having some dysuria and frequency. She denies any blood or pus in her urine. We will have her urine checked  Patient is also due her pro time INR to be performed this will be done through the lab and pharmacy.  Relevant past medical, surgical, family and social history reviewed and updated as indicated. Allergies and medications reviewed and updated.  Past Medical History:  Diagnosis Date  . A-fib ([place])   . Anal fissure    resolved   . Anemia   . Back pain    with left radiculopathy  . CAD (coronary artery disease)   . CAD (coronary artery disease)   . Cataract   . DDD (degenerative disc disease)   . Depression   . Diabetes mellitus   . Diverticulosis   . Dyslipidemia   . Dysrhythmia    AFib  . Esophagitis   . Generalized headaches   . GERD (gastroesophageal reflux disease)   . Hearing loss   . Hiatal hernia   . Hx of peptic ulcer   . Hyperlipidemia   . Hypertension   . Hypothyroidism   . Internal hemorrhoids 2017  . NSTEMI (non-ST elevated myocardial infarction)  ([place]) 08/05/2015  . Osteopenia   . Sebaceous cyst    on back  . Sleep apnea    not using CPAP now, could not tolerate and PCP aware.  . Thyroid disease     Past Surgical History:  Procedure Laterality Date  . ABDOMINAL HYSTERECTOMY Bilateral 2001 - approximate  . APPENDECTOMY    . BREAST REDUCTION SURGERY    . CARDIAC CATHETERIZATION N/A 08/08/2015   Procedure: Left Heart Cath and Coronary Angiography;  Surgeon: Burnell Blanks, MD;  Location: [facility];  Service: Cardiovascular;  Laterality: N/A;  . CHOLECYSTECTOMY  2008 - approximate  . COLONOSCOPY  06/2007   Friable anal canal and anal papillae. Pancolonic diverticula. Normal terminal ileum. Status post segmental biopsy, descending colon biopsy showed minimal cryptitis. Biopsies felt to be  nonspecific but could be seen with NSAIDs. No features of inflammatory bowel disease. Stool studies were negative.  . COLONOSCOPY  03/21/2011   RMR: colonic polyps treated as described above, colonic diverticulosis (Tubular adenomas)  . COLONOSCOPY WITH PROPOFOL N/A 06/17/2014   RMR: Colonic diverticulosis. Multiple colonic polyps removed as described above.   . COLONOSCOPY WITH PROPOFOL N/A 03/19/2016   Procedure: COLONOSCOPY WITH PROPOFOL;  Surgeon: Daneil Dolin, MD;  [facility];  Service: Endoscopy;  Laterality: N/A;  8:45am  . CORONARY ANGIOPLASTY WITH STENT PLACEMENT     4 stents  . ESOPHAGEAL DILATION N/A 06/17/2014   Procedure: ESOPHAGEAL DILATION;  Surgeon: Daneil Dolin, MD;  Location: [facility];  Service: Endoscopy;  Laterality: N/A;  Maloney 17 Fr  . ESOPHAGOGASTRODUODENOSCOPY  06/2007   Small hiatal hernia  . ESOPHAGOGASTRODUODENOSCOPY  03/21/2011   RMR: normal esophagus- status post passage of Maloney dilator. Small hiatal hernia. Trivial antral and bulbar erosions  . ESOPHAGOGASTRODUODENOSCOPY (EGD) WITH PROPOFOL N/A 06/17/2014   RMR: Small hiatal hernia; otherwise normal EGD. Status post passage of a Maloney  dilator. No explanation for patients right upper quadrant abdominal pain.   Marland Kitchen ESOPHAGOGASTRODUODENOSCOPY (EGD) WITH PROPOFOL N/A 03/19/2016   Procedure: ESOPHAGOGASTRODUODENOSCOPY (EGD) WITH PROPOFOL;  Surgeon: Daneil Dolin, MD;  [facility];  Service: Endoscopy;  Laterality: N/A;  . KNEE SURGERY Left    arthroscopy  . LIPOMA EXCISION  03/17/2012   Procedure: EXCISION LIPOMA;  Surgeon: Gwenyth Ober, MD;  Location: [facility];  Service: General;  Laterality: Right;  excision of right lower back lipoma  . MALONEY DILATION N/A 03/19/2016   Procedure: Venia Minks DILATION;  Surgeon: Daneil Dolin, MD;  [facility];  Service: Endoscopy;  Laterality: N/A;  . POLYPECTOMY  06/17/2014   Procedure: POLYPECTOMY;  Surgeon: Daneil Dolin, MD;  Location: [facility];  Service: Endoscopy;;    Review of Systems  Constitutional: Negative.   HENT: Positive for congestion.   Eyes: Negative.   Respiratory: Negative.   Gastrointestinal: Negative.   Genitourinary: Positive for dysuria and frequency. Negative for difficulty urinating, enuresis, flank pain and hematuria.  Neurological: Positive for dizziness and light-headedness. Negative for seizures, syncope, weakness and headaches.    Allergies as of 06/27/2016      Reactions   Promethazine Hcl Anaphylaxis      Medication List       Accurate as of 06/27/16 12:01 PM. Always use your most recent med list.          ALPRAZolam 0.5 MG tablet Commonly known as:  XANAX TAKE ONE TABLET AT BEDTIME AS NEEDED   amLODipine 10 MG tablet Commonly known as:  NORVASC Take 1 tablet (10 mg total) by mouth daily.   aspirin 81 MG EC tablet Take 1 tablet (81 mg total) by mouth daily.   escitalopram 10 MG tablet Commonly known as:  LEXAPRO Take 1 tablet (10 mg total) by mouth daily.   isosorbide mononitrate 60 MG 24 hr tablet Commonly known as:  IMDUR Take 1 tablet (60 mg total) by mouth daily.   levothyroxine 25 MCG  tablet Commonly known as:  LEVOTHROID Take 1 tablet (25 mcg total) by mouth daily before breakfast.   linaclotide 145 MCG Caps capsule Commonly known as:  LINZESS Take 1 capsule (145 mcg total) by mouth daily.   lisinopril-hydrochlorothiazide 20-12.5 MG tablet Commonly known as:  PRINZIDE,ZESTORETIC Take 2 tablets by mouth daily.   meclizine 25 MG tablet Commonly known as:  ANTIVERT Take 1 tablet (25 mg total) by mouth 3 (three) times daily as needed for dizziness.   metFORMIN 500 MG tablet Commonly known as:  GLUCOPHAGE TAKE TWO TABLETS BY MOUTH EVERY MORNING   metoprolol 50 MG tablet Commonly known as:  LOPRESSOR TAKE ONE TABLET BY MOUTH TWICE DAILY   nitroGLYCERIN 0.4 MG SL tablet Commonly known as:  NITROSTAT  Place 0.4 mg under the tongue every 5 (five) minutes as needed for chest pain.   pantoprazole 40 MG tablet Commonly known as:  PROTONIX Take 1 tablet (40 mg total) by mouth 2 (two) times daily.   spironolactone 25 MG tablet Commonly known as:  ALDACTONE Take 1 tablet (25 mg total) by mouth daily.   traMADol 50 MG tablet Commonly known as:  ULTRAM Take 1 tablet (50 mg total) by mouth every 12 (twelve) hours as needed.   warfarin 5 MG tablet Commonly known as:  COUMADIN TAKE ONE TO ONE AND A HALF TABLETS DAILYAS DIRECTED          Objective:    BP (!) 169/68   Pulse 64   Temp 97 F (36.1 C) (Oral)   Ht 5\' 2"  (1.575 m)   Wt 197 lb (89.4 kg)   BMI 36.03 kg/m   Allergies  Allergen Reactions  . Promethazine Hcl Anaphylaxis    Physical Exam  Constitutional: She is oriented to person, place, and time. She appears well-developed and well-nourished.  HENT:  Head: Normocephalic and atraumatic.  Eyes: Conjunctivae and EOM are normal. Pupils are equal, round, and reactive to light.  Cardiovascular: Normal rate, regular rhythm, normal heart sounds and intact distal pulses.   Pulmonary/Chest: Effort normal and breath sounds normal.  Abdominal: Soft.  Bowel sounds are normal. She exhibits no distension. There is no tenderness. There is no rebound.  Neurological: She is alert and oriented to person, place, and time. She has normal reflexes. Coordination abnormal.  Positive rhomberg   Skin: Skin is warm and dry. No rash noted.  Psychiatric: She has a normal mood and affect. Her behavior is normal. Judgment and thought content normal.    Results for orders placed or performed in visit on 05/31/16  HM DIABETES EYE EXAM  Result Value Ref Range   HM Diabetic Eye Exam No Retinopathy No Retinopathy      Assessment & Plan:   1. Vertigo - meclizine (ANTIVERT) 25 MG tablet; Take 1 tablet (25 mg total) by mouth 3 (three) times daily as needed for dizziness.  Dispense: 30 tablet; Refill: 1  2. Dysuria - Urinalysis, Complete Urine was within normal limits patient will be called and made aware. Follow-up as needed.  3. Paroxysmal atrial fibrillation (HCC) - CoaguChek XS/INR Waived  4. Long term current use of anticoagulant therapy - CoaguChek XS/INR Waived   Continue all other maintenance medications as listed above.  Follow up plan: Return if symptoms worsen or fail to improve.  Educational handout given for vertigo  Terald Sleeper PA-C [facility] [address]  [place], [place] [address] [phone]   06/27/2016, 12:01 PM

## 2016-07-11 ENCOUNTER — Ambulatory Visit (INDEPENDENT_AMBULATORY_CARE_PROVIDER_SITE_OTHER): Payer: Medicare Other | Admitting: Family Medicine

## 2016-07-11 ENCOUNTER — Encounter: Payer: Self-pay | Admitting: Family Medicine

## 2016-07-11 VITALS — BP 139/78 | HR 71 | Temp 96.9°F | Ht 62.0 in | Wt 197.0 lb

## 2016-07-11 DIAGNOSIS — G44039 Episodic paroxysmal hemicrania, not intractable: Secondary | ICD-10-CM

## 2016-07-11 DIAGNOSIS — H8309 Labyrinthitis, unspecified ear: Secondary | ICD-10-CM | POA: Diagnosis not present

## 2016-07-11 MED ORDER — BETAMETHASONE SOD PHOS & ACET 6 (3-3) MG/ML IJ SUSP
6.0000 mg | Freq: Once | INTRAMUSCULAR | Status: AC
  Administered 2016-07-11: 6 mg via INTRAMUSCULAR

## 2016-07-11 NOTE — Progress Notes (Signed)
Subjective:  Patient ID: Rhonda Garcia, female    DOB: December 31, 1940  Age: 76 y.o. MRN: [id number]  CC: Dizziness (pt here today c/o what she thinks is vertigo and has been on Meclizine for 2 weeks but it isn't really helping.)   HPI Rhonda Garcia presents for vertigo starting on 3/7 approx. Noticed also tinnitus described as a buzz. Varies in intensity. Sx are staggering with poor balance intermittently. No relief from the meclizine. Accompanied by right parietal HA - over the right ear. Moderate severity. Vision blurs too.  History Rhonda Garcia has a past medical history of A-fib ([place]); Anal fissure; Anemia; Back pain; CAD (coronary artery disease); CAD (coronary artery disease); Cataract; DDD (degenerative disc disease); Depression; Diabetes mellitus; Diverticulosis; Dyslipidemia; Dysrhythmia; Esophagitis; Generalized headaches; GERD (gastroesophageal reflux disease); Hearing loss; Hiatal hernia; peptic ulcer; Hyperlipidemia; Hypertension; Hypothyroidism; Internal hemorrhoids (2017); NSTEMI (non-ST elevated myocardial infarction) ([place]) (08/05/2015); Osteopenia; Sebaceous cyst; Sleep apnea; and Thyroid disease.   She has a past surgical history that includes Knee surgery (Left); Appendectomy; Breast reduction surgery; Esophagogastroduodenoscopy (06/2007); Colonoscopy (06/2007); Colonoscopy (03/21/2011); Cholecystectomy (2008 - approximate); Lipoma excision (03/17/2012); Esophagogastroduodenoscopy (03/21/2011); Esophagogastroduodenoscopy (egd) with propofol (N/A, 06/17/2014); Esophageal dilation (N/A, 06/17/2014); Colonoscopy with propofol (N/A, 06/17/2014); polypectomy (06/17/2014); Coronary angioplasty with stent; Cardiac catheterization (N/A, 08/08/2015); Abdominal hysterectomy (Bilateral, 2001 - approximate); Colonoscopy with propofol (N/A, 03/19/2016); Esophagogastroduodenoscopy (egd) with propofol (N/A, 03/19/2016); and maloney dilation (N/A, 03/19/2016).   Her family history includes Cancer in her sister;  Clotting disorder in her brother; Coronary artery disease in her other; Diabetes in her brother, sister, and sister; Heart attack in her mother; Heart disease in her brother; Hypertension in her mother; Kidney disease in her brother and other; Melanoma in her brother; Stroke in her mother and sister.She reports that she quit smoking about 35 years ago. Her smoking use included Cigarettes. She has a 2.50 pack-year smoking history. She has never used smokeless tobacco. She reports that she drinks about 0.6 oz of alcohol per week . She reports that she does not use drugs.  Current Outpatient Prescriptions on File Prior to Visit  Medication Sig Dispense Refill  . ALPRAZolam (XANAX) 0.5 MG tablet TAKE ONE TABLET AT BEDTIME AS NEEDED 30 tablet 3  . amLODipine (NORVASC) 10 MG tablet Take 1 tablet (10 mg total) by mouth daily. 90 tablet 3  . aspirin EC 81 MG EC tablet Take 1 tablet (81 mg total) by mouth daily.    Marland Kitchen escitalopram (LEXAPRO) 10 MG tablet Take 1 tablet (10 mg total) by mouth daily. 90 tablet 3  . isosorbide mononitrate (IMDUR) 60 MG 24 hr tablet Take 1 tablet (60 mg total) by mouth daily. 90 tablet 3  . levothyroxine (LEVOTHROID) 25 MCG tablet Take 1 tablet (25 mcg total) by mouth daily before breakfast. 90 tablet 0  . Linaclotide (LINZESS) 145 MCG CAPS capsule Take 1 capsule (145 mcg total) by mouth daily. 90 capsule 3  . lisinopril-hydrochlorothiazide (PRINZIDE,ZESTORETIC) 20-12.5 MG per tablet Take 2 tablets by mouth daily. 180 tablet 6  . meclizine (ANTIVERT) 25 MG tablet Take 1 tablet (25 mg total) by mouth 3 (three) times daily as needed for dizziness. 30 tablet 1  . metFORMIN (GLUCOPHAGE) 500 MG tablet TAKE TWO TABLETS BY MOUTH EVERY MORNING (Patient taking differently: No sig reported) 180 tablet 2  . metoprolol (LOPRESSOR) 50 MG tablet TAKE ONE TABLET BY MOUTH TWICE DAILY 60 tablet 2  . nitroGLYCERIN (NITROSTAT) 0.4 MG SL tablet Place 0.4 mg under the tongue every 5 (five)  minutes as  needed for chest pain.     . pantoprazole (PROTONIX) 40 MG tablet Take 1 tablet (40 mg total) by mouth 2 (two) times daily. 60 tablet 1  . spironolactone (ALDACTONE) 25 MG tablet Take 1 tablet (25 mg total) by mouth daily. 30 tablet 6  . traMADol (ULTRAM) 50 MG tablet Take 1 tablet (50 mg total) by mouth every 12 (twelve) hours as needed. (Patient taking differently: Take 50 mg by mouth every 12 (twelve) hours as needed for moderate pain. ) 60 tablet 2  . warfarin (COUMADIN) 5 MG tablet TAKE ONE TO ONE AND A HALF TABLETS DAILYAS DIRECTED 45 tablet 2   No current facility-administered medications on file prior to visit.     ROS Review of Systems  Constitutional: Negative for activity change, appetite change and fever.  HENT: Negative for congestion, rhinorrhea and sore throat.   Eyes: Negative for visual disturbance.  Respiratory: Negative for cough and shortness of breath.   Cardiovascular: Negative for chest pain and palpitations.  Gastrointestinal: Negative for abdominal pain, diarrhea and nausea.  Genitourinary: Negative for dysuria.  Musculoskeletal: Negative for arthralgias and myalgias.  Neurological: Positive for dizziness and light-headedness. Negative for syncope.    Objective:  BP 139/78   Pulse 71   Temp (!) 96.9 F (36.1 C) (Oral)   Ht 5\' 2"  (1.575 m)   Wt 197 lb (89.4 kg)   BMI 36.03 kg/m   Physical Exam  Constitutional: She is oriented to person, place, and time. She appears well-developed and well-nourished. No distress.  HENT:  Head: Normocephalic and atraumatic.  Eyes: Conjunctivae are normal. Pupils are equal, round, and reactive to light.  Neck: Normal range of motion. Neck supple. No thyromegaly present.  Cardiovascular: Normal rate, regular rhythm and normal heart sounds.   No murmur heard. Pulmonary/Chest: Effort normal and breath sounds normal. No respiratory distress. She has no wheezes. She has no rales.  Abdominal: Soft. Bowel sounds are normal. She  exhibits no distension. There is no tenderness.  Musculoskeletal: Normal range of motion.  Lymphadenopathy:    She has no cervical adenopathy.  Neurological: She is alert and oriented to person, place, and time.  Skin: Skin is warm and dry.  Psychiatric: She has a normal mood and affect. Her behavior is normal. Judgment and thought content normal.    Assessment & Plan:   Laquanda was seen today for dizziness.  Diagnoses and all orders for this visit:  Labyrinthitis, unspecified laterality -     betamethasone acetate-betamethasone sodium phosphate (CELESTONE) injection 6 mg; Inject 1 mL (6 mg total) into the muscle once. -     MR BRAIN W WO CONTRAST; Future  Episodic paroxysmal hemicrania, not intractable -     MR BRAIN W WO CONTRAST; Future   I am having Ms. [place] maintain her nitroGLYCERIN, lisinopril-hydrochlorothiazide, amLODipine, metFORMIN, spironolactone, linaclotide, aspirin, isosorbide mononitrate, levothyroxine, escitalopram, traMADol, ALPRAZolam, pantoprazole, metoprolol, warfarin, and meclizine. We administered betamethasone acetate-betamethasone sodium phosphate.  Meds ordered this encounter  Medications  . betamethasone acetate-betamethasone sodium phosphate (CELESTONE) injection 6 mg     Follow-up: Return in about 2 weeks (around 07/25/2016).  Claretta Fraise, M.D.

## 2016-07-13 ENCOUNTER — Ambulatory Visit (HOSPITAL_COMMUNITY)
Admission: RE | Admit: 2016-07-13 | Discharge: 2016-07-13 | Disposition: A | Discharge status: Home / Self Care | Payer: Medicare Other | Source: Ambulatory Visit | Attending: Family Medicine | Admitting: Family Medicine

## 2016-07-13 DIAGNOSIS — G44039 Episodic paroxysmal hemicrania, not intractable: Secondary | ICD-10-CM | POA: Diagnosis not present

## 2016-07-13 DIAGNOSIS — R51 Headache: Secondary | ICD-10-CM | POA: Insufficient documentation

## 2016-07-13 DIAGNOSIS — R42 Dizziness and giddiness: Secondary | ICD-10-CM | POA: Insufficient documentation

## 2016-07-13 DIAGNOSIS — H539 Unspecified visual disturbance: Secondary | ICD-10-CM | POA: Diagnosis present

## 2016-07-13 DIAGNOSIS — H8309 Labyrinthitis, unspecified ear: Secondary | ICD-10-CM | POA: Diagnosis not present

## 2016-07-13 LAB — POCT I-STAT CREATININE: Creatinine, Ser: 0.8 mg/dL (ref 0.44–1.00)

## 2016-07-13 MED ORDER — GADOBENATE DIMEGLUMINE 529 MG/ML IV SOLN
20.0000 mL | Freq: Once | INTRAVENOUS | Status: AC | PRN
  Administered 2016-07-13: 18 mL via INTRAVENOUS

## 2016-07-18 ENCOUNTER — Ambulatory Visit: Payer: Medicare Other | Admitting: Nurse Practitioner

## 2016-07-18 ENCOUNTER — Ambulatory Visit: Payer: Medicare Other | Admitting: Family

## 2016-08-06 ENCOUNTER — Ambulatory Visit (INDEPENDENT_AMBULATORY_CARE_PROVIDER_SITE_OTHER): Payer: Medicare Other | Admitting: Family

## 2016-08-06 ENCOUNTER — Encounter: Payer: Self-pay | Admitting: Family

## 2016-08-06 VITALS — BP 137/75 | HR 59 | Temp 97.4°F | Ht 62.0 in | Wt 198.0 lb

## 2016-08-06 DIAGNOSIS — K5900 Constipation, unspecified: Secondary | ICD-10-CM | POA: Diagnosis not present

## 2016-08-06 DIAGNOSIS — F331 Major depressive disorder, recurrent, moderate: Secondary | ICD-10-CM

## 2016-08-06 DIAGNOSIS — I25110 Atherosclerotic heart disease of native coronary artery with unstable angina pectoris: Secondary | ICD-10-CM

## 2016-08-06 DIAGNOSIS — K219 Gastro-esophageal reflux disease without esophagitis: Secondary | ICD-10-CM

## 2016-08-06 DIAGNOSIS — E119 Type 2 diabetes mellitus without complications: Secondary | ICD-10-CM

## 2016-08-06 DIAGNOSIS — F411 Generalized anxiety disorder: Secondary | ICD-10-CM | POA: Diagnosis not present

## 2016-08-06 DIAGNOSIS — I10 Essential (primary) hypertension: Secondary | ICD-10-CM | POA: Diagnosis not present

## 2016-08-06 DIAGNOSIS — E785 Hyperlipidemia, unspecified: Secondary | ICD-10-CM

## 2016-08-06 DIAGNOSIS — M1990 Unspecified osteoarthritis, unspecified site: Secondary | ICD-10-CM | POA: Diagnosis not present

## 2016-08-06 DIAGNOSIS — I480 Paroxysmal atrial fibrillation: Secondary | ICD-10-CM

## 2016-08-06 DIAGNOSIS — E039 Hypothyroidism, unspecified: Secondary | ICD-10-CM

## 2016-08-06 DIAGNOSIS — G2581 Restless legs syndrome: Secondary | ICD-10-CM

## 2016-08-06 DIAGNOSIS — Z7901 Long term (current) use of anticoagulants: Secondary | ICD-10-CM

## 2016-08-06 LAB — COAGUCHEK XS/INR WAIVED
INR: 2 — ABNORMAL HIGH (ref 0.9–1.1)
Prothrombin Time: 24.2 s

## 2016-08-06 LAB — BAYER DCA HB A1C WAIVED: HB A1C (BAYER DCA - WAIVED): 8.8 % — ABNORMAL HIGH (ref <7.0)

## 2016-08-06 MED ORDER — CARBIDOPA-LEVODOPA 25-100 MG PO TABS: 1.0000 | ORAL_TABLET | Freq: Every evening | ORAL | 1 refills | Status: DC | PRN

## 2016-08-06 NOTE — Patient Instructions (Signed)
Arthritis Arthritis is a term that is commonly used to refer to joint pain or joint disease. There are more than 100 types of arthritis. What are the causes? The most common cause of this condition is wear and tear of a joint. Other causes include:  Gout.  Inflammation of a joint.  An infection of a joint.  Sprains and other injuries near the joint.  A drug reaction or allergic reaction. In some cases, the cause may not be known. What are the signs or symptoms? The main symptom of this condition is pain in the joint with movement. Other symptoms include:  Redness, swelling, or stiffness at a joint.  Warmth coming from the joint.  Fever.  Overall feeling of illness. How is this diagnosed? This condition may be diagnosed with a physical exam and tests, including:  Blood tests.  Urine tests.  Imaging tests, such as MRI, X-rays, or a CT scan. Sometimes, fluid is removed from a joint for testing. How is this treated? Treatment for this condition may involve:  Treatment of the cause, if it is known.  Rest.  Raising (elevating) the joint.  Applying cold or hot packs to the joint.  Medicines to improve symptoms and reduce inflammation.  Injections of a steroid such as cortisone into the joint to help reduce pain and inflammation. Depending on the cause of your arthritis, you may need to make lifestyle changes to reduce stress on your joint. These changes may include exercising more and losing weight. Follow these instructions at home: Medicines   Take over-the-counter and prescription medicines only as told by your health care provider.  Do not take aspirin to relieve pain if gout is suspected. Activity   Rest your joint if told by your health care provider. Rest is important when your disease is active and your joint feels painful, swollen, or stiff.  Avoid activities that make the pain worse. It is important to balance activity with rest.  Exercise your joint  regularly with range-of-motion exercises as told by your health care provider. Try doing low-impact exercise, such as:  Swimming.  Water aerobics.  Biking.  Walking. Joint Care    If your joint is swollen, keep it elevated if told by your health care provider.  If your joint feels stiff in the morning, try taking a warm shower.  If directed, apply heat to the joint. If you have diabetes, do not apply heat without permission from your health care provider.  Put a towel between the joint and the hot pack or heating pad.  Leave the heat on the area for 20-30 minutes.  If directed, apply ice to the joint:  Put ice in a plastic bag.  Place a towel between your skin and the bag.  Leave the ice on for 20 minutes, 2-3 times per day.  Keep all follow-up visits as told by your health care provider. This is important. Contact a health care provider if:  The pain gets worse.  You have a fever. Get help right away if:  You develop severe joint pain, swelling, or redness.  Many joints become painful and swollen.  You develop severe back pain.  You develop severe weakness in your leg.  You cannot control your bladder or bowels. This information is not intended to replace advice given to you by your health care provider. Make sure you discuss any questions you have with your health care provider. Document Released: 05/03/2004 Document Revised: 09/01/2015 Document Reviewed: 06/21/2014 Elsevier Interactive Patient Education    2017 Elsevier Inc.  

## 2016-08-06 NOTE — Progress Notes (Addendum)
Subjective:    Patient ID: Rhonda Garcia, female    DOB: [birth year], 76 y.o.   MRN: [id number]  Pt presents to the office today for chronic follow up. PT is followed by Cardiologists annually for MI.  Diabetes  She presents for her follow-up diabetic visit. She has type 2 diabetes mellitus. Her disease course has been stable. Hypoglycemia symptoms include nervousness/anxiousness. Associated symptoms include fatigue and visual change. Pertinent negatives for diabetes include no blurred vision and no foot paresthesias. Symptoms are stable. Risk factors for coronary artery disease include diabetes mellitus, dyslipidemia, hypertension, sedentary lifestyle and post-menopausal. She is compliant with treatment all of the time. Home blood sugar record trend: does not check BS at home.  Hypertension  This is a chronic problem. The current episode started more than 1 year ago. The problem has been resolved since onset. The problem is controlled. Associated symptoms include anxiety and peripheral edema ("a little bit"). Pertinent negatives include no blurred vision or shortness of breath. The current treatment provides significant improvement. Identifiable causes of hypertension include a thyroid problem.  Hyperlipidemia  This is a chronic problem. The current episode started more than 1 year ago. The problem is controlled. Recent lipid tests were reviewed and are normal. Exacerbating diseases include obesity. Associated symptoms include myalgias. Pertinent negatives include no shortness of breath.  Constipation  This is a chronic problem. The current episode started more than 1 year ago. Her stool frequency is 1 time per day. She has tried laxatives for the symptoms. The treatment provided moderate relief.  Thyroid Problem  Presents for follow-up visit. Symptoms include anxiety, constipation, depressed mood, fatigue and visual change. The symptoms have been worsening. Her past medical history is significant  for hyperlipidemia.  Arthritis  Presents for follow-up visit. She complains of pain and stiffness. The symptoms have been worsening. Affected locations include the left hip and right hip. Her pain is at a severity of 8/10. Associated symptoms include fatigue.  Depression         This is a chronic problem.  The current episode started more than 1 year ago.   The onset quality is gradual.   The problem occurs intermittently.  Associated symptoms include fatigue, myalgias and sad.  Associated symptoms include no helplessness and no hopelessness.  Past treatments include SSRIs - Selective serotonin reuptake inhibitors.  Past medical history includes thyroid problem and anxiety.   Anxiety  Presents for follow-up visit. Symptoms include depressed mood, excessive worry and nervous/anxious behavior. Patient reports no shortness of breath. Symptoms occur occasionally.    Gastroesophageal Reflux  She complains of heartburn. This is a chronic problem. The problem occurs occasionally. The problem has been waxing and waning. Associated symptoms include fatigue. She has tried a PPI for the symptoms. The treatment provided moderate relief.  RLS PT complaining of jerking and moving of bilateral legs that have become worse. Pt states this has been going on her years, but over the last several months is has become worse and causing her not to sleep.  A Fib PT currently taking warfarin. Stable   Review of Systems  Constitutional: Positive for fatigue.  Eyes: Negative for blurred vision.  Respiratory: Negative for shortness of breath.   Gastrointestinal: Positive for constipation and heartburn.  Musculoskeletal: Positive for arthralgias, arthritis, gait problem, myalgias and stiffness.  Psychiatric/Behavioral: Positive for depression. The patient is nervous/anxious.   All other systems reviewed and are negative.      Objective:   Physical Exam  Constitutional:  She is oriented to person, place, and time.  She appears well-developed and well-nourished. No distress.  HENT:  Head: Normocephalic and atraumatic.  Right Ear: External ear normal.  Left Ear: External ear normal.  Nose: Nose normal.  Mouth/Throat: Oropharynx is clear and moist.  Eyes: Pupils are equal, round, and reactive to light.  Neck: Normal range of motion. Neck supple. No thyromegaly present.  Cardiovascular: Normal rate, regular rhythm, normal heart sounds and intact distal pulses.   No murmur heard. Pulmonary/Chest: Effort normal and breath sounds normal. No respiratory distress. She has no wheezes.  Abdominal: Soft. Bowel sounds are normal. She exhibits no distension. There is no tenderness.  Musculoskeletal: Normal range of motion. She exhibits no edema or tenderness.  Neurological: She is alert and oriented to person, place, and time. She has normal reflexes. No cranial nerve deficit.  Skin: Skin is warm and dry.  Psychiatric: She has a normal mood and affect. Her behavior is normal. Judgment and thought content normal.  Vitals reviewed.  Diabetic Foot Exam - Simple   Simple Foot Form Diabetic Foot exam was performed with the following findings:  Yes 08/06/2016 10:56 AM  Visual Inspection No deformities, no ulcerations, no other skin breakdown bilaterally:  Yes Sensation Testing Intact to touch and monofilament testing bilaterally:  Yes Pulse Check Posterior Tibialis and Dorsalis pulse intact bilaterally:  Yes Comments       BP 137/75   Pulse (!) 59   Temp 97.4 F (36.3 C) (Oral)   Ht 5' 2"  (1.575 m)   Wt 198 lb (89.8 kg)   BMI 36.21 kg/m      Assessment & Plan:  1. Essential hypertension  - CMP14+EGFR  2. Coronary artery disease involving native coronary artery of native heart with unstable angina pectoris (HCC) - CMP14+EGFR - Lipid panel  3. Gastroesophageal reflux disease without esophagitis - CMP14+EGFR  4. Constipation, unspecified constipation type - CMP14+EGFR  5. Hypothyroidism,  unspecified type  - CMP14+EGFR - Thyroid Panel With TSH  6. Type 2 diabetes mellitus without complication, without long-term current use of insulin (HCC) - CMP14+EGFR - Bayer DCA Hb A1c Waived  7. Osteoarthritis, unspecified osteoarthritis type, unspecified site - CMP14+EGFR  8. Hyperlipidemia, unspecified hyperlipidemia type  - CMP14+EGFR - Lipid panel  9. GAD (generalized anxiety disorder) - CMP14+EGFR  10. Moderate episode of recurrent major depressive disorder (HCC) - CMP14+EGFR  11. Restless leg syndrome -PT started on Sinemet today - carbidopa-levodopa (SINEMET) 25-100 MG tablet; Take 1 tablet by mouth at bedtime as needed.  Dispense: 90 tablet; Refill: 1  12. Paroxysmal atrial fibrillation (HCC) - CoaguChek XS/INR Waived  13. Long term current use of anticoagulant therapy Anticoagulation Dose Instructions as of 08/06/2016      Dorene Grebe Tue Wed Thu Fri Sat   New Dose 7.5 mg 5 mg 7.5 mg 5 mg 7.5 mg 5 mg 7.5 mg    Description   Continuel warfarin 57m dose - Take 1 tablet Mondays, Wednesdays and Fridays.  Take 1 and 1/2 tablets all other days.     - CoaguChek XS/INR Waived   Continue all meds Labs pending Health Maintenance reviewed Diet and exercise encouraged RTO 3 month   CEvelina Dun FNP

## 2016-08-06 NOTE — Addendum Note (Signed)
Addended by: Evelina Dun A on: 08/06/2016 12:21 PM   Modules accepted: Orders

## 2016-08-07 LAB — THYROID PANEL WITH TSH
Free Thyroxine Index: 1.5 (ref 1.2–4.9)
T3 Uptake Ratio: 26 % (ref 24–39)
T4, Total: 5.9 ug/dL (ref 4.5–12.0)
TSH: 2.06 u[IU]/mL (ref 0.450–4.500)

## 2016-08-07 LAB — CMP14+EGFR
ALT: 20 IU/L (ref 0–32)
AST: 20 IU/L (ref 0–40)
Albumin/Globulin Ratio: 1.6 (ref 1.2–2.2)
Albumin: 3.7 g/dL (ref 3.5–4.8)
Alkaline Phosphatase: 86 IU/L (ref 39–117)
BUN/Creatinine Ratio: 14 (ref 12–28)
BUN: 11 mg/dL (ref 8–27)
Bilirubin Total: 0.6 mg/dL (ref 0.0–1.2)
CO2: 25 mmol/L (ref 18–29)
Calcium: 9 mg/dL (ref 8.7–10.3)
Chloride: 104 mmol/L (ref 96–106)
Creatinine, Ser: 0.77 mg/dL (ref 0.57–1.00)
GFR calc Af Amer: 87 mL/min/{1.73_m2} (ref >59)
GFR calc non Af Amer: 76 mL/min/{1.73_m2} (ref >59)
Globulin, Total: 2.3 g/dL (ref 1.5–4.5)
Glucose: 172 mg/dL — ABNORMAL HIGH (ref 65–99)
Potassium: 4.3 mmol/L (ref 3.5–5.2)
Sodium: 140 mmol/L (ref 134–144)
Total Protein: 6 g/dL (ref 6.0–8.5)

## 2016-08-07 LAB — LIPID PANEL
Chol/HDL Ratio: 3.9 ratio (ref 0.0–4.4)
Cholesterol, Total: 162 mg/dL (ref 100–199)
HDL: 42 mg/dL (ref >39)
LDL Calculated: 100 mg/dL — ABNORMAL HIGH (ref 0–99)
Triglycerides: 98 mg/dL (ref 0–149)
VLDL Cholesterol Cal: 20 mg/dL (ref 5–40)

## 2016-08-08 ENCOUNTER — Ambulatory Visit: Payer: Medicare Other | Admitting: Nurse Practitioner

## 2016-08-08 ENCOUNTER — Other Ambulatory Visit: Payer: Self-pay | Admitting: Family

## 2016-08-08 MED ORDER — METFORMIN HCL 1000 MG PO TABS
1000.0000 mg | ORAL_TABLET | Freq: Two times a day (BID) | ORAL | 3 refills | Status: DC
Start: 2016-08-08 — End: 2016-10-03

## 2016-08-22 ENCOUNTER — Encounter: Payer: Self-pay | Admitting: Pharmacist

## 2016-08-29 ENCOUNTER — Other Ambulatory Visit: Payer: Self-pay | Admitting: Family

## 2016-08-29 ENCOUNTER — Other Ambulatory Visit: Payer: Self-pay | Admitting: Cardiology

## 2016-08-29 ENCOUNTER — Ambulatory Visit (INDEPENDENT_AMBULATORY_CARE_PROVIDER_SITE_OTHER): Payer: Medicare Other | Admitting: Nurse Practitioner

## 2016-08-29 ENCOUNTER — Encounter: Payer: Self-pay | Admitting: Nurse Practitioner

## 2016-08-29 ENCOUNTER — Other Ambulatory Visit: Payer: Self-pay | Admitting: Family Medicine

## 2016-08-29 VITALS — BP 105/61 | HR 71 | Temp 97.2°F | Ht 62.0 in | Wt 196.8 lb

## 2016-08-29 DIAGNOSIS — K5900 Constipation, unspecified: Secondary | ICD-10-CM

## 2016-08-29 DIAGNOSIS — M1990 Unspecified osteoarthritis, unspecified site: Secondary | ICD-10-CM

## 2016-08-29 MED ORDER — LINACLOTIDE 72 MCG PO CAPS: 72.0000 ug | ORAL_CAPSULE | Freq: Every day | ORAL | 0 refills | Status: DC

## 2016-08-29 NOTE — Progress Notes (Signed)
cc'ed to pcp °

## 2016-08-29 NOTE — Assessment & Plan Note (Signed)
Noted constipation is significantly better on Linzess 145 g. She is having to take it every other day because it tends to cause loose stools. She feels much better overall. I will try to start her on 72 g samples once a day to see if this can control her constipation without causing diarrhea. I will provide samples for 2 weeks, however: 1-2 weeks with a progress report. Return for follow-up in 2 months to further address. Call if any worsening or severe symptoms.

## 2016-08-29 NOTE — Patient Instructions (Signed)
1. I'm going to give the samples of Linzess 72 g pills. 2. Take this once a day, on an empty stomach. 3. Call us in 1-2 weeks and let us know if it is helping your symptoms and not causing diarrhea. 4. Return for follow-up in 2 months. 5. Call us if you have any severe or worsening symptoms.

## 2016-08-29 NOTE — Progress Notes (Signed)
Referring Provider: Sharion Balloon, FNP Primary Care Physician:  Sharion Balloon, FNP Primary GI:  Dr. Gala Romney  Chief Complaint  Patient presents with  . Constipation    goes too much if takes Linzess 145 everyday    HPI:   Rhonda Garcia is a 76 y.o. female who presents For evaluation of constipation. The patient was last seen in our office 02/22/2016 for dysphagia, GERD, history of colon polyps. At that time it was noted she was due for one year repeat colonoscopy and had just been cleared for this, on anticoagulation for A. fib, some lower abdominal pain with constipation which improves a bowel movement otherwise typically Linzess works well for her. Also with solid food dysphagia and chronic GERD with occasional breakthrough symptoms. Recommended colonoscopy and endoscopy, add Colace daily, return for follow-up as needed or based on postprocedure recommendations.  Colonoscopy completed 03/19/2016 which found internal hemorrhoids, diverticulosis in the entire examined colon, lipoma 1 cm ascending segment, otherwise normal. Repeat colonoscopy in 3 years. EGD with dilation found LA grade a esophagitis, normal stomach and duodenum, status post passage of Maloney dilator. Follow-up in office as needed.  Per the nursing staff the patient is on Linzess 145 g daily which typically causes diarrhea but she takes it every other day she has constipation. Today she states she's only on Linzess. She's taking it every other day but it causes diarrhea. Metformin causes diarrhea as well. However, she feels she's doing a whole lot better. Taking it over day does also cause sensation of incomplete emptying. She has never tried any other dose of Linzess. Denies further abdominal pain. Still having nausea. Denies hematochezia, melena, unintnetional weight loss, fever, chills, acute changes in bowel habits. Denies chest pain, worsening dyspnea, dizziness, lightheadedness, syncope, near syncope. Denies any other  upper or lower GI symptoms.  Past Medical History:  Diagnosis Date  . A-fib ([place])   . Anal fissure    resolved   . Anemia   . Back pain    with left radiculopathy  . CAD (coronary artery disease)   . CAD (coronary artery disease)   . Cataract   . DDD (degenerative disc disease)   . Depression   . Diabetes mellitus   . Diverticulosis   . Dyslipidemia   . Dysrhythmia    AFib  . Esophagitis   . Generalized headaches   . GERD (gastroesophageal reflux disease)   . Hearing loss   . Hiatal hernia   . Hx of peptic ulcer   . Hyperlipidemia   . Hypertension   . Hypothyroidism   . Internal hemorrhoids 2017  . NSTEMI (non-ST elevated myocardial infarction) ([place]) 08/05/2015  . Osteopenia   . Sebaceous cyst    on back  . Sleep apnea    not using CPAP now, could not tolerate and PCP aware.  . Thyroid disease     Past Surgical History:  Procedure Laterality Date  . ABDOMINAL HYSTERECTOMY Bilateral 2001 - approximate  . APPENDECTOMY    . BREAST REDUCTION SURGERY    . CARDIAC CATHETERIZATION N/A 08/08/2015   Procedure: Left Heart Cath and Coronary Angiography;  Surgeon: Burnell Blanks, MD;  Location: [facility];  Service: Cardiovascular;  Laterality: N/A;  . CHOLECYSTECTOMY  2008 - approximate  . COLONOSCOPY  06/2007   Friable anal canal and anal papillae. Pancolonic diverticula. Normal terminal ileum. Status post segmental biopsy, descending colon biopsy showed minimal cryptitis. Biopsies felt to be  nonspecific but could  be seen with NSAIDs. No features of inflammatory bowel disease. Stool studies were negative.  . COLONOSCOPY  03/21/2011   RMR: colonic polyps treated as described above, colonic diverticulosis (Tubular adenomas)  . COLONOSCOPY WITH PROPOFOL N/A 06/17/2014   RMR: Colonic diverticulosis. Multiple colonic polyps removed as described above.   . COLONOSCOPY WITH PROPOFOL N/A 03/19/2016   Procedure: COLONOSCOPY WITH PROPOFOL;  Surgeon: Daneil Dolin, MD;   Location: [facility];  Service: Endoscopy;  Laterality: N/A;  8:45am  . CORONARY ANGIOPLASTY WITH STENT PLACEMENT     4 stents  . ESOPHAGEAL DILATION N/A 06/17/2014   Procedure: ESOPHAGEAL DILATION;  Surgeon: Daneil Dolin, MD;  Location: [facility];  Service: Endoscopy;  Laterality: N/A;  Maloney 67 Fr  . ESOPHAGOGASTRODUODENOSCOPY  06/2007   Small hiatal hernia  . ESOPHAGOGASTRODUODENOSCOPY  03/21/2011   RMR: normal esophagus- status post passage of Maloney dilator. Small hiatal hernia. Trivial antral and bulbar erosions  . ESOPHAGOGASTRODUODENOSCOPY (EGD) WITH PROPOFOL N/A 06/17/2014   RMR: Small hiatal hernia; otherwise normal EGD. Status post passage of a Maloney dilator. No explanation for patients right upper quadrant abdominal pain.   Marland Kitchen ESOPHAGOGASTRODUODENOSCOPY (EGD) WITH PROPOFOL N/A 03/19/2016   Procedure: ESOPHAGOGASTRODUODENOSCOPY (EGD) WITH PROPOFOL;  Surgeon: Daneil Dolin, MD;  Location: [facility];  Service: Endoscopy;  Laterality: N/A;  . KNEE SURGERY Left    arthroscopy  . LIPOMA EXCISION  03/17/2012   Procedure: EXCISION LIPOMA;  Surgeon: Gwenyth Ober, MD;  Location: [facility];  Service: General;  Laterality: Right;  excision of right lower back lipoma  . MALONEY DILATION N/A 03/19/2016   Procedure: Venia Minks DILATION;  Surgeon: Daneil Dolin, MD;  Location[facility];  Service: Endoscopy;  Laterality: N/A;  . POLYPECTOMY  06/17/2014   Procedure: POLYPECTOMY;  Surgeon: Daneil Dolin, MD;  Location: [facility];  Service: Endoscopy;;    Current Outpatient Prescriptions  Medication Sig Dispense Refill  . ALPRAZolam (XANAX) 0.5 MG tablet TAKE ONE TABLET AT BEDTIME AS NEEDED 30 tablet 3  . amLODipine (NORVASC) 10 MG tablet Take 1 tablet (10 mg total) by mouth daily. 90 tablet 3  . aspirin EC 81 MG EC tablet Take 1 tablet (81 mg total) by mouth daily.    . carbidopa-levodopa (SINEMET) 25-100 MG tablet Take 1 tablet by mouth at bedtime as needed. 90 tablet 1    . escitalopram (LEXAPRO) 10 MG tablet Take 1 tablet (10 mg total) by mouth daily. 90 tablet 3  . isosorbide mononitrate (IMDUR) 60 MG 24 hr tablet Take 1 tablet (60 mg total) by mouth daily. 90 tablet 3  . levothyroxine (LEVOTHROID) 25 MCG tablet Take 1 tablet (25 mcg total) by mouth daily before breakfast. 90 tablet 0  . lisinopril-hydrochlorothiazide (PRINZIDE,ZESTORETIC) 20-12.5 MG per tablet Take 2 tablets by mouth daily. 180 tablet 6  . metFORMIN (GLUCOPHAGE) 1000 MG tablet Take 1 tablet (1,000 mg total) by mouth 2 (two) times daily with a meal. 180 tablet 3  . metoprolol tartrate (LOPRESSOR) 100 MG tablet Take 100 mg by mouth 2 (two) times daily.    . nitroGLYCERIN (NITROSTAT) 0.4 MG SL tablet Place 0.4 mg under the tongue every 5 (five) minutes as needed for chest pain.     . pantoprazole (PROTONIX) 40 MG tablet Take 1 tablet (40 mg total) by mouth 2 (two) times daily. 60 tablet 1  . spironolactone (ALDACTONE) 25 MG tablet Take 1 tablet (25 mg total) by mouth daily. 30 tablet  6  . traMADol (ULTRAM) 50 MG tablet Take 1 tablet (50 mg total) by mouth every 12 (twelve) hours as needed. (Patient taking differently: Take 50 mg by mouth every 12 (twelve) hours as needed for moderate pain. ) 60 tablet 2  . warfarin (COUMADIN) 5 MG tablet TAKE 1 TO 1&1/2 TABLET DAILY AS DIRECTED 135 tablet 0  . linaclotide (LINZESS) 72 MCG capsule Take 1 capsule (72 mcg total) by mouth daily before breakfast. 16 capsule 0  . metoprolol (LOPRESSOR) 50 MG tablet TAKE ONE TABLET BY MOUTH TWICE DAILY (Patient not taking: Reported on 08/29/2016) 60 tablet 2   No current facility-administered medications for this visit.     Allergies as of 08/29/2016 - Review Complete 08/29/2016  Allergen Reaction Noted  . Promethazine hcl Anaphylaxis 09/06/2010  . Penicillins Other (See Comments) 03/13/2011    Family History  Problem Relation Age of Onset  . Heart attack Mother   . Hypertension Mother   . Stroke Mother         Deceased, age 36  . Diabetes Sister   . Diabetes Sister   . Cancer Sister        ovarian  . Stroke Sister   . Diabetes Brother        also has a blood disorder   . Clotting disorder Brother   . Kidney disease Brother   . Heart disease Brother   . Melanoma Brother        deceased  . Coronary artery disease Other   . Kidney disease Other   . Colon cancer Neg Hx     Social History   Social History  . Marital status: Widowed    Spouse name: N/A  . Number of children: 3  . Years of education: N/A   Occupational History  . Retired    Social History Main Topics  . Smoking status: Former Smoker    Packs/day: 0.50    Years: 5.00    Types: Cigarettes    Quit date: 03/13/1981  . Smokeless tobacco: Never Used     Comment: quit 30 +years ago  . Alcohol use 0.6 oz/week    1 Glasses of wine per week  . Drug use: No  . Sexual activity: Yes    Birth control/ protection: Post-menopausal   Other Topics Concern  . None   Social History Narrative   Married    Review of Systems: General: Negative for anorexia, weight loss, fever, chills, fatigue, weakness. ENT: Negative for hoarseness, difficulty swallowing. CV: Negative for chest pain, angina, palpitations, peripheral edema.  Respiratory: Negative for dyspnea at rest, cough, sputum, wheezing.  GI: See history of present illness. Endo: Negative for unusual weight change.  Heme: Negative for bruising or bleeding. Allergy: Negative for rash or hives.   Physical Exam: BP 105/61   Pulse 71   Temp 97.2 F (36.2 C) (Oral)   Ht 5\' 2"  (1.575 m)   Wt 196 lb 12.8 oz (89.3 kg)   BMI 36.00 kg/m  General:   Alert and oriented. Pleasant and cooperative. Well-nourished and well-developed.  Eyes:  Without icterus, sclera clear and conjunctiva pink.  Ears:  Normal auditory acuity. Cardiovascular:  S1, S2 present without murmurs appreciated. Extremities without clubbing or edema. Respiratory:  Clear to auscultation bilaterally. No  wheezes, rales, or rhonchi. No distress.  Gastrointestinal:  +BS, rounded but soft, non-tender and non-distended. No HSM noted. No guarding or rebound. No masses appreciated.  Rectal:  Deferred  Musculoskalatal:  Symmetrical  without gross deformities Neurologic:  Alert and oriented x4;  grossly normal neurologically. Psych:  Alert and cooperative. Normal mood and affect. Heme/Lymph/Immune: No excessive bruising noted.    08/29/2016 2:54 PM   Disclaimer: This note was dictated with voice recognition software. Similar sounding words can inadvertently be transcribed and may not be corrected upon review.

## 2016-08-29 NOTE — Telephone Encounter (Signed)
REFILL 

## 2016-08-30 NOTE — Telephone Encounter (Signed)
Please call in Ultram

## 2016-08-31 NOTE — Telephone Encounter (Signed)
Rx called in 

## 2016-09-06 ENCOUNTER — Encounter: Payer: Self-pay | Admitting: Pharmacist

## 2016-09-07 ENCOUNTER — Encounter: Payer: Self-pay | Admitting: Family

## 2016-10-03 ENCOUNTER — Ambulatory Visit (INDEPENDENT_AMBULATORY_CARE_PROVIDER_SITE_OTHER): Payer: Medicare Other | Admitting: Pharmacist

## 2016-10-03 DIAGNOSIS — I480 Paroxysmal atrial fibrillation: Secondary | ICD-10-CM | POA: Diagnosis not present

## 2016-10-03 DIAGNOSIS — Z7901 Long term (current) use of anticoagulants: Secondary | ICD-10-CM

## 2016-10-03 LAB — COAGUCHEK XS/INR WAIVED
INR: 2.9 — ABNORMAL HIGH (ref 0.9–1.1)
Prothrombin Time: 34.9 s

## 2016-10-03 MED ORDER — METFORMIN HCL ER 500 MG PO TB24: 1000.0000 mg | ORAL_TABLET | Freq: Every day | ORAL | 1 refills | Status: DC

## 2016-10-03 MED ORDER — SITAGLIPTIN PHOSPHATE 100 MG PO TABS: 100.0000 mg | ORAL_TABLET | Freq: Every day | ORAL | 0 refills | Status: DC

## 2016-10-03 MED ORDER — METOPROLOL TARTRATE 50 MG PO TABS: 50.0000 mg | ORAL_TABLET | Freq: Two times a day (BID) | ORAL | 0 refills | Status: DC

## 2016-10-03 NOTE — Progress Notes (Signed)
Patient ID: Rhonda Garcia, female   DOB: 20-Jul-1940, 76 y.o.   MRN: [id number]   Subjective:     Indication: atrial fibrillation Bleeding signs/symptoms: None Thromboembolic signs/symptoms: None  Missed Coumadin doses: None Medication changes: yes - patient states that she has not been taking metformin regularly since increased from 500mg  bid to 1000mg  bid due to diarrhea.  Usually takes every other day.  Last A1c was over 8.0% Dietary changes: no Bacterial/viral infection: no Other concerns: yes - uncontrolled DM Reports home BG in 190's  Objective:    INR Today: 2.9 Last A1c = 8.8% Current dose:  Warfarin 5mg  - take 1 tablet MWF, take 1.5 tablets all other days.  Assessment:    Therapeutic INR for goal of 2-3   Uncontrolled DM   Plan:    1. New dose: no change   2. Next INR: 1 month   3.  Change metformin to XR 500mg  2 tablets daily.  Add Januvia 100mg  qd - gave #35 samples.  4.  Follow up with PCP in 1 month; with me in 2 months.

## 2016-10-04 ENCOUNTER — Other Ambulatory Visit: Payer: Self-pay | Admitting: Family

## 2016-10-05 NOTE — Telephone Encounter (Signed)
Last seen 08/06/16  Rhonda Garcia  If approved route to nurse to call into [facility]

## 2016-10-05 NOTE — Telephone Encounter (Signed)
Refill called to [facility] 

## 2016-10-31 ENCOUNTER — Ambulatory Visit: Payer: Medicare Other | Admitting: Nurse Practitioner

## 2016-10-31 ENCOUNTER — Telehealth: Payer: Self-pay | Admitting: Nurse Practitioner

## 2016-10-31 ENCOUNTER — Encounter: Payer: Self-pay | Admitting: Nurse Practitioner

## 2016-10-31 NOTE — Telephone Encounter (Signed)
PATIENT WAS A NO SHOW AND LETTER SENT  °

## 2016-10-31 NOTE — Telephone Encounter (Signed)
Noted  

## 2016-11-05 ENCOUNTER — Ambulatory Visit: Payer: Medicare Other | Admitting: Family

## 2016-11-05 ENCOUNTER — Encounter: Payer: Self-pay | Admitting: Family

## 2016-11-05 ENCOUNTER — Ambulatory Visit (INDEPENDENT_AMBULATORY_CARE_PROVIDER_SITE_OTHER): Payer: Medicare Other | Admitting: Family

## 2016-11-05 VITALS — BP 142/73 | HR 64 | Temp 98.6°F | Ht 62.0 in | Wt 198.0 lb

## 2016-11-05 DIAGNOSIS — I480 Paroxysmal atrial fibrillation: Secondary | ICD-10-CM

## 2016-11-05 DIAGNOSIS — K219 Gastro-esophageal reflux disease without esophagitis: Secondary | ICD-10-CM

## 2016-11-05 DIAGNOSIS — Z7901 Long term (current) use of anticoagulants: Secondary | ICD-10-CM

## 2016-11-05 DIAGNOSIS — E119 Type 2 diabetes mellitus without complications: Secondary | ICD-10-CM | POA: Diagnosis not present

## 2016-11-05 DIAGNOSIS — I10 Essential (primary) hypertension: Secondary | ICD-10-CM | POA: Diagnosis not present

## 2016-11-05 DIAGNOSIS — F331 Major depressive disorder, recurrent, moderate: Secondary | ICD-10-CM | POA: Diagnosis not present

## 2016-11-05 DIAGNOSIS — E785 Hyperlipidemia, unspecified: Secondary | ICD-10-CM

## 2016-11-05 DIAGNOSIS — F411 Generalized anxiety disorder: Secondary | ICD-10-CM

## 2016-11-05 DIAGNOSIS — E039 Hypothyroidism, unspecified: Secondary | ICD-10-CM

## 2016-11-05 DIAGNOSIS — E669 Obesity, unspecified: Secondary | ICD-10-CM | POA: Insufficient documentation

## 2016-11-05 DIAGNOSIS — E559 Vitamin D deficiency, unspecified: Secondary | ICD-10-CM

## 2016-11-05 DIAGNOSIS — I25110 Atherosclerotic heart disease of native coronary artery with unstable angina pectoris: Secondary | ICD-10-CM | POA: Diagnosis not present

## 2016-11-05 DIAGNOSIS — M1990 Unspecified osteoarthritis, unspecified site: Secondary | ICD-10-CM

## 2016-11-05 DIAGNOSIS — K5900 Constipation, unspecified: Secondary | ICD-10-CM

## 2016-11-05 LAB — COAGUCHEK XS/INR WAIVED
INR: 1.6 — ABNORMAL HIGH (ref 0.9–1.1)
Prothrombin Time: 19.6 s

## 2016-11-05 LAB — BAYER DCA HB A1C WAIVED: HB A1C (BAYER DCA - WAIVED): 7.8 % — ABNORMAL HIGH (ref <7.0)

## 2016-11-05 MED ORDER — WARFARIN SODIUM 5 MG PO TABS: ORAL_TABLET | ORAL | 0 refills | Status: DC

## 2016-11-05 MED ORDER — ALPRAZOLAM 0.5 MG PO TABS: 0.5000 mg | ORAL_TABLET | Freq: Every evening | ORAL | 4 refills | Status: DC | PRN

## 2016-11-05 MED ORDER — TRAMADOL HCL 50 MG PO TABS: 50.0000 mg | ORAL_TABLET | Freq: Two times a day (BID) | ORAL | 5 refills | Status: DC | PRN

## 2016-11-05 MED ORDER — PANTOPRAZOLE SODIUM 40 MG PO TBEC: 40.0000 mg | DELAYED_RELEASE_TABLET | Freq: Two times a day (BID) | ORAL | 5 refills | Status: DC

## 2016-11-05 NOTE — Progress Notes (Signed)
Subjective:    Patient ID: Rhonda Garcia, female    DOB: 28-Dec-1940, 76 y.o.   MRN: [id number]  Pt presents to the office today for chronic follow up. PT is followed by Cardiologists annually for MI.  Diabetes  She presents for her follow-up diabetic visit. She has type 2 diabetes mellitus. Her disease course has been stable. Pertinent negatives for hypoglycemia include no nervousness/anxiousness. Associated symptoms include blurred vision ("some times"). Pertinent negatives for diabetes include no foot paresthesias and no visual change. There are no hypoglycemic complications. Symptoms are stable. Diabetic complications include heart disease. Pertinent negatives for diabetic complications include no CVA or peripheral neuropathy. Risk factors for coronary artery disease include dyslipidemia, female sex, obesity, post-menopausal, sedentary lifestyle and family history. She is following a generally unhealthy diet. Her breakfast blood glucose range is generally 180-200 mg/dl. Eye exam is current.  Hyperlipidemia  This is a chronic problem. The current episode started more than 1 year ago. The problem is uncontrolled. Recent lipid tests were reviewed and are high. Exacerbating diseases include obesity. Pertinent negatives include no shortness of breath. Current antihyperlipidemic treatment includes statins. The current treatment provides moderate improvement of lipids. Risk factors for coronary artery disease include diabetes mellitus, dyslipidemia, family history, obesity and post-menopausal.  Anxiety  Presents for follow-up visit. Symptoms include irritability. Patient reports no depressed mood, excessive worry, nervous/anxious behavior, restlessness or shortness of breath. Symptoms occur occasionally. The quality of sleep is good.    Depression         This is a chronic problem.  The current episode started more than 1 year ago.   The onset quality is gradual.   The problem occurs intermittently.  The  problem has been resolved since onset.  Associated symptoms include no helplessness, no hopelessness, not irritable, no restlessness and not sad.  Past treatments include SSRIs - Selective serotonin reuptake inhibitors.  Past medical history includes thyroid problem and anxiety.   Constipation  This is a chronic problem. The current episode started more than 1 year ago. The problem has been resolved since onset. Risk factors include obesity. She has tried laxatives for the symptoms. The treatment provided moderate relief.  Hypertension  This is a chronic problem. The current episode started more than 1 year ago. The problem has been waxing and waning since onset. The problem is uncontrolled. Associated symptoms include anxiety and blurred vision ("some times"). Pertinent negatives include no malaise/fatigue, peripheral edema or shortness of breath. Risk factors for coronary artery disease include dyslipidemia, diabetes mellitus, family history, obesity and sedentary lifestyle. The current treatment provides mild improvement. Hypertensive end-organ damage includes CAD/MI. There is no history of CVA or heart failure. Identifiable causes of hypertension include a thyroid problem.  Arthritis  Presents for follow-up visit. She complains of pain and stiffness. The symptoms have been stable. Affected locations include the right knee, left knee, right MCP and left MCP. Her pain is at a severity of 8/10.  Gastroesophageal Reflux  She complains of heartburn. She reports no belching or no coughing. This is a chronic problem. The problem has been waxing and waning. The symptoms are aggravated by certain foods and caffeine. She has tried a PPI for the symptoms. The treatment provided moderate relief.  Thyroid Problem  Presents for follow-up visit. Symptoms include constipation. Patient reports no anxiety, depressed mood or visual change. The symptoms have been stable. Her past medical history is significant for  hyperlipidemia. There is no history of heart failure.  A Fib  Pt currently taking warfarin. Stable.    Review of Systems  Constitutional: Positive for irritability. Negative for malaise/fatigue.  Eyes: Positive for blurred vision ("some times").  Respiratory: Negative for cough and shortness of breath.   Gastrointestinal: Positive for constipation and heartburn.  Musculoskeletal: Positive for arthritis and stiffness.  Psychiatric/Behavioral: Positive for depression. The patient is not nervous/anxious.   All other systems reviewed and are negative.      Objective:   Physical Exam  Constitutional: She is oriented to person, place, and time. She appears well-developed and well-nourished. She is not irritable. No distress.  HENT:  Head: Normocephalic and atraumatic.  Right Ear: External ear normal.  Left Ear: External ear normal.  Nose: Nose normal.  Mouth/Throat: Oropharynx is clear and moist.  Eyes: Pupils are equal, round, and reactive to light.  Neck: Normal range of motion. Neck supple. No thyromegaly present.  Cardiovascular: Normal rate, regular rhythm, normal heart sounds and intact distal pulses.   No murmur heard. Pulmonary/Chest: Effort normal and breath sounds normal. No respiratory distress. She has no wheezes.  Abdominal: Soft. Bowel sounds are normal. She exhibits no distension. There is no tenderness.  Musculoskeletal: Normal range of motion. She exhibits no edema or tenderness.  Neurological: She is alert and oriented to person, place, and time.  Skin: Skin is warm and dry.  Psychiatric: She has a normal mood and affect. Her behavior is normal. Judgment and thought content normal.  Vitals reviewed.     BP (!) 142/73   Pulse 64   Temp 98.6 F (37 C) (Oral)   Ht 5' 2"  (1.575 m)   Wt 198 lb (89.8 kg)   BMI 36.21 kg/m      Assessment & Plan:  1. Essential hypertension - CMP14+EGFR  2. Coronary artery disease involving native coronary artery of native  heart with unstable angina pectoris (HCC) - CMP14+EGFR - Lipid panel  3. Gastroesophageal reflux disease without esophagitis - pantoprazole (PROTONIX) 40 MG tablet; Take 1 tablet (40 mg total) by mouth 2 (two) times daily.  Dispense: 60 tablet; Refill: 5 - CMP14+EGFR  4. Constipation, unspecified constipation type - CMP14+EGFR  5. Hypothyroidism, unspecified type  - CMP14+EGFR - TSH  6. Type 2 diabetes mellitus without complication, without long-term current use of insulin (HCC) - CMP14+EGFR - Bayer DCA Hb A1c Waived  7. Osteoarthritis, unspecified osteoarthritis type, unspecified site - CMP14+EGFR - traMADol (ULTRAM) 50 MG tablet; Take 1 tablet (50 mg total) by mouth every 12 (twelve) hours as needed.  Dispense: 60 tablet; Refill: 5  8. Vitamin D deficiency - CMP14+EGFR - VITAMIN D 25 Hydroxy (Vit-D Deficiency, Fractures)  9. Long term current use of anticoagulant therapy - warfarin (COUMADIN) 5 MG tablet; TAKE 1 TO 1&1/2 TABLET DAILY AS DIRECTED  Dispense: 135 tablet; Refill: 0 - CoaguChek XS/INR Waived - CMP14+EGFR  10. Hyperlipidemia, unspecified hyperlipidemia type - CMP14+EGFR - Lipid panel  11. GAD (generalized anxiety disorder) - CMP14+EGFR  12. Moderate episode of recurrent major depressive disorder (HCC) - CMP14+EGFR  13. Paroxysmal atrial fibrillation (HCC) Will take 1/2 tab extra today, pt states she missed a dose last week  Anticoagulation Warfarin Dose Instructions as of 11/05/2016      Rhonda Garcia Tue Wed Thu Fri Sat   New Dose 7.5 mg 5 mg 7.5 mg 5 mg 7.5 mg 5 mg 7.5 mg    Description   INR-1.6, Take 1/2 tablet (2.5 mg) then resume Continue warfarin 57m dose - Take 1 tablet Mondays, Wednesdays and  Fridays.  Take 1 and 1/2 tablets all other days.      14. Morbid obesity ([place])    Continue all meds Labs pending Health Maintenance reviewed Diet and exercise encouraged RTO 3 months   Rhonda Dun, FNP

## 2016-11-05 NOTE — Patient Instructions (Signed)
Diabetes Mellitus and Food It is important for you to manage your blood sugar (glucose) level. Your blood glucose level can be greatly affected by what you eat. Eating healthier foods in the appropriate amounts throughout the day at about the same time each day will help you control your blood glucose level. It can also help slow or prevent worsening of your diabetes mellitus. Healthy eating may even help you improve the level of your blood pressure and reach or maintain a healthy weight. General recommendations for healthful eating and cooking habits include:  Eating meals and snacks regularly. Avoid going long periods of time without eating to lose weight.  Eating a diet that consists mainly of plant-based foods, such as fruits, vegetables, nuts, legumes, and whole grains.  Using low-heat cooking methods, such as baking, instead of high-heat cooking methods, such as deep frying.  Work with your dietitian to make sure you understand how to use the Nutrition Facts information on food labels. How can food affect me? Carbohydrates Carbohydrates affect your blood glucose level more than any other type of food. Your dietitian will help you determine how many carbohydrates to eat at each meal and teach you how to count carbohydrates. Counting carbohydrates is important to keep your blood glucose at a healthy level, especially if you are using insulin or taking certain medicines for diabetes mellitus. Alcohol Alcohol can cause sudden decreases in blood glucose (hypoglycemia), especially if you use insulin or take certain medicines for diabetes mellitus. Hypoglycemia can be a life-threatening condition. Symptoms of hypoglycemia (sleepiness, dizziness, and disorientation) are similar to symptoms of having too much alcohol. If your health care provider has given you approval to drink alcohol, do so in moderation and use the following guidelines:  Women should not have more than one drink per day, and men  should not have more than two drinks per day. One drink is equal to: ? 12 oz of beer. ? 5 oz of wine. ? 1 oz of hard liquor.  Do not drink on an empty stomach.  Keep yourself hydrated. Have water, diet soda, or unsweetened iced tea.  Regular soda, juice, and other mixers might contain a lot of carbohydrates and should be counted.  What foods are not recommended? As you make food choices, it is important to remember that all foods are not the same. Some foods have fewer nutrients per serving than other foods, even though they might have the same number of calories or carbohydrates. It is difficult to get your body what it needs when you eat foods with fewer nutrients. Examples of foods that you should avoid that are high in calories and carbohydrates but low in nutrients include:  Trans fats (most processed foods list trans fats on the Nutrition Facts label).  Regular soda.  Juice.  Candy.  Sweets, such as cake, pie, doughnuts, and cookies.  Fried foods.  What foods can I eat? Eat nutrient-rich foods, which will nourish your body and keep you healthy. The food you should eat also will depend on several factors, including:  The calories you need.  The medicines you take.  Your weight.  Your blood glucose level.  Your blood pressure level.  Your cholesterol level.  You should eat a variety of foods, including:  Protein. ? Lean cuts of meat. ? Proteins low in saturated fats, such as fish, egg whites, and beans. Avoid processed meats.  Fruits and vegetables. ? Fruits and vegetables that may help control blood glucose levels, such as apples,   mangoes, and yams.  Dairy products. ? Choose fat-free or low-fat dairy products, such as milk, yogurt, and cheese.  Grains, bread, pasta, and rice. ? Choose whole grain products, such as multigrain bread, whole oats, and brown rice. These foods may help control blood pressure.  Fats. ? Foods containing healthful fats, such as  nuts, avocado, olive oil, canola oil, and fish.  Does everyone with diabetes mellitus have the same meal plan? Because every person with diabetes mellitus is different, there is not one meal plan that works for everyone. It is very important that you meet with a dietitian who will help you create a meal plan that is just right for you. This information is not intended to replace advice given to you by your health care provider. Make sure you discuss any questions you have with your health care provider. Document Released: 12/21/2004 Document Revised: 09/01/2015 Document Reviewed: 02/20/2013 Elsevier Interactive Patient Education  2017 Elsevier Inc.  

## 2016-11-06 ENCOUNTER — Other Ambulatory Visit: Payer: Self-pay | Admitting: Family

## 2016-11-06 LAB — LIPID PANEL
Chol/HDL Ratio: 4.3 ratio (ref 0.0–4.4)
Cholesterol, Total: 154 mg/dL (ref 100–199)
HDL: 36 mg/dL — ABNORMAL LOW (ref >39)
LDL Calculated: 90 mg/dL (ref 0–99)
Triglycerides: 140 mg/dL (ref 0–149)
VLDL Cholesterol Cal: 28 mg/dL (ref 5–40)

## 2016-11-06 LAB — CMP14+EGFR
ALT: 16 IU/L (ref 0–32)
AST: 22 IU/L (ref 0–40)
Albumin/Globulin Ratio: 1.7 (ref 1.2–2.2)
Albumin: 3.9 g/dL (ref 3.5–4.8)
Alkaline Phosphatase: 81 IU/L (ref 39–117)
BUN/Creatinine Ratio: 15 (ref 12–28)
BUN: 12 mg/dL (ref 8–27)
Bilirubin Total: 0.6 mg/dL (ref 0.0–1.2)
CO2: 26 mmol/L (ref 20–29)
Calcium: 9.2 mg/dL (ref 8.7–10.3)
Chloride: 103 mmol/L (ref 96–106)
Creatinine, Ser: 0.79 mg/dL (ref 0.57–1.00)
GFR calc Af Amer: 85 mL/min/{1.73_m2} (ref >59)
GFR calc non Af Amer: 73 mL/min/{1.73_m2} (ref >59)
Globulin, Total: 2.3 g/dL (ref 1.5–4.5)
Glucose: 178 mg/dL — ABNORMAL HIGH (ref 65–99)
Potassium: 4.5 mmol/L (ref 3.5–5.2)
Sodium: 142 mmol/L (ref 134–144)
Total Protein: 6.2 g/dL (ref 6.0–8.5)

## 2016-11-06 LAB — VITAMIN D 25 HYDROXY (VIT D DEFICIENCY, FRACTURES): Vit D, 25-Hydroxy: 25.9 ng/mL — ABNORMAL LOW (ref 30.0–100.0)

## 2016-11-06 LAB — TSH: TSH: 5 u[IU]/mL — ABNORMAL HIGH (ref 0.450–4.500)

## 2016-11-06 MED ORDER — LEVOTHYROXINE SODIUM 50 MCG PO TABS: 50.0000 ug | ORAL_TABLET | Freq: Every day | ORAL | 11 refills | Status: DC

## 2016-11-06 MED ORDER — VITAMIN D (ERGOCALCIFEROL) 1.25 MG (50000 UNIT) PO CAPS: 50000.0000 [IU] | ORAL_CAPSULE | ORAL | 3 refills | Status: DC

## 2016-11-23 ENCOUNTER — Encounter: Payer: Self-pay | Admitting: Family

## 2016-11-23 ENCOUNTER — Ambulatory Visit (INDEPENDENT_AMBULATORY_CARE_PROVIDER_SITE_OTHER): Payer: Medicare Other | Admitting: Family

## 2016-11-23 VITALS — BP 143/74 | HR 69 | Temp 97.6°F | Ht 62.0 in | Wt 194.0 lb

## 2016-11-23 DIAGNOSIS — R5383 Other fatigue: Secondary | ICD-10-CM

## 2016-11-23 DIAGNOSIS — I480 Paroxysmal atrial fibrillation: Secondary | ICD-10-CM

## 2016-11-23 DIAGNOSIS — Z7901 Long term (current) use of anticoagulants: Secondary | ICD-10-CM | POA: Diagnosis not present

## 2016-11-23 LAB — COAGUCHEK XS/INR WAIVED
INR: 2.5 — ABNORMAL HIGH (ref 0.9–1.1)
Prothrombin Time: 30.4 s

## 2016-11-23 NOTE — Progress Notes (Signed)
   Subjective:    Patient ID: Rhonda Garcia, female    DOB: May 24, 1940, 76 y.o.   MRN: [id number]  HPI PT presents to the office today with fatigue and increase sleepiness and INR recheck. PT currently taking warfarin for A fib. Denies any bleeding.   Pt's lab work on 07/30 has elevated TSH, low Vit D. PT's levothyroxine was increased to 50 mcg.    Review of Systems  Constitutional: Positive for fatigue.  All other systems reviewed and are negative.      Objective:   Physical Exam  Constitutional: She is oriented to person, place, and time. She appears well-developed and well-nourished. No distress.  HENT:  Head: Normocephalic.  Cardiovascular: Normal rate, regular rhythm, normal heart sounds and intact distal pulses.   No murmur heard. Pulmonary/Chest: Effort normal and breath sounds normal. No respiratory distress. She has no wheezes.  Abdominal: Soft. Bowel sounds are normal. She exhibits no distension. There is no tenderness. There is no rebound.  Musculoskeletal: Normal range of motion. She exhibits no edema or tenderness.  Neurological: She is alert and oriented to person, place, and time.  Skin: Skin is warm and dry.  Psychiatric: She has a normal mood and affect. Her behavior is normal. Judgment and thought content normal.  Vitals reviewed.   BP (!) 143/74   Pulse 69   Temp 97.6 F (36.4 C) (Oral)   Ht 5\' 2"  (1.575 m)   Wt 194 lb (88 kg)   BMI 35.48 kg/m      Assessment & Plan:  1. Paroxysmal atrial fibrillation (HCC) - CoaguChek XS/INR Waived Anticoagulation Warfarin Dose Instructions as of 11/23/2016      Dorene Grebe Tue Wed Thu Fri Sat   New Dose 7.5 mg 5 mg 7.5 mg 5 mg 7.5 mg 5 mg 7.5 mg    Description   INR-2.5, Continue warfarin 5mg  dose - Take 1 tablet Mondays, Wednesdays and Fridays.  Take 1 and 1/2 tablets all other days.       2. Long term current use of anticoagulant therapy  3. Fatigue, unspecified type Anemia pending Rest EKg  normal Encourage exercise and health diet - EKG 12-Lead - Anemia Profile B   Evelina Dun, FNP

## 2016-11-23 NOTE — Patient Instructions (Signed)

## 2016-11-24 LAB — ANEMIA PROFILE B
Basophils Absolute: 0 10*3/uL (ref 0.0–0.2)
Basos: 1 %
EOS (ABSOLUTE): 0.1 10*3/uL (ref 0.0–0.4)
Eos: 3 %
Ferritin: 74 ng/mL (ref 15–150)
Folate: 12.9 ng/mL (ref >3.0)
Hematocrit: 39.2 % (ref 34.0–46.6)
Hemoglobin: 12.8 g/dL (ref 11.1–15.9)
Immature Grans (Abs): 0 10*3/uL (ref 0.0–0.1)
Immature Granulocytes: 0 %
Iron Saturation: 18 % (ref 15–55)
Iron: 42 ug/dL (ref 27–139)
Lymphocytes Absolute: 1.4 10*3/uL (ref 0.7–3.1)
Lymphs: 28 %
MCH: 30 pg (ref 26.6–33.0)
MCHC: 32.7 g/dL (ref 31.5–35.7)
MCV: 92 fL (ref 79–97)
Monocytes Absolute: 0.6 10*3/uL (ref 0.1–0.9)
Monocytes: 11 %
Neutrophils Absolute: 2.8 10*3/uL (ref 1.4–7.0)
Neutrophils: 57 %
Platelets: 158 10*3/uL (ref 150–379)
RBC: 4.26 x10E6/uL (ref 3.77–5.28)
RDW: 14.4 % (ref 12.3–15.4)
Retic Ct Pct: 1.2 % (ref 0.6–2.6)
Total Iron Binding Capacity: 233 ug/dL — ABNORMAL LOW (ref 250–450)
UIBC: 191 ug/dL (ref 118–369)
Vitamin B-12: 409 pg/mL (ref 232–1245)
WBC: 4.9 10*3/uL (ref 3.4–10.8)

## 2016-12-05 ENCOUNTER — Ambulatory Visit: Payer: Self-pay | Admitting: Pharmacist

## 2016-12-28 ENCOUNTER — Encounter: Payer: Self-pay | Admitting: *Deleted

## 2016-12-30 DIAGNOSIS — I4891 Unspecified atrial fibrillation: Secondary | ICD-10-CM | POA: Diagnosis not present

## 2016-12-30 DIAGNOSIS — I10 Essential (primary) hypertension: Secondary | ICD-10-CM | POA: Diagnosis not present

## 2016-12-30 DIAGNOSIS — E119 Type 2 diabetes mellitus without complications: Secondary | ICD-10-CM | POA: Diagnosis not present

## 2016-12-30 DIAGNOSIS — Z7901 Long term (current) use of anticoagulants: Secondary | ICD-10-CM | POA: Diagnosis not present

## 2016-12-30 DIAGNOSIS — I252 Old myocardial infarction: Secondary | ICD-10-CM | POA: Diagnosis not present

## 2016-12-30 DIAGNOSIS — R0602 Shortness of breath: Secondary | ICD-10-CM | POA: Diagnosis not present

## 2016-12-30 DIAGNOSIS — R0789 Other chest pain: Secondary | ICD-10-CM | POA: Diagnosis not present

## 2016-12-30 DIAGNOSIS — R079 Chest pain, unspecified: Secondary | ICD-10-CM | POA: Diagnosis not present

## 2016-12-30 DIAGNOSIS — I2510 Atherosclerotic heart disease of native coronary artery without angina pectoris: Secondary | ICD-10-CM | POA: Diagnosis not present

## 2016-12-30 DIAGNOSIS — Z955 Presence of coronary angioplasty implant and graft: Secondary | ICD-10-CM | POA: Diagnosis not present

## 2016-12-30 DIAGNOSIS — Z23 Encounter for immunization: Secondary | ICD-10-CM | POA: Diagnosis not present

## 2016-12-30 DIAGNOSIS — K219 Gastro-esophageal reflux disease without esophagitis: Secondary | ICD-10-CM | POA: Diagnosis not present

## 2017-01-04 ENCOUNTER — Telehealth: Payer: Self-pay | Admitting: Cardiology

## 2017-01-04 NOTE — Telephone Encounter (Signed)
Closed encounter °

## 2017-01-07 ENCOUNTER — Ambulatory Visit: Payer: Medicare Other | Admitting: Family

## 2017-01-07 ENCOUNTER — Encounter: Payer: Self-pay | Admitting: Family

## 2017-01-07 ENCOUNTER — Ambulatory Visit (INDEPENDENT_AMBULATORY_CARE_PROVIDER_SITE_OTHER): Payer: Medicare Other | Admitting: Family

## 2017-01-07 VITALS — BP 146/72 | HR 61 | Temp 96.9°F | Ht 62.0 in | Wt 192.6 lb

## 2017-01-07 DIAGNOSIS — I10 Essential (primary) hypertension: Secondary | ICD-10-CM | POA: Diagnosis not present

## 2017-01-07 DIAGNOSIS — K219 Gastro-esophageal reflux disease without esophagitis: Secondary | ICD-10-CM | POA: Diagnosis not present

## 2017-01-07 DIAGNOSIS — H8110 Benign paroxysmal vertigo, unspecified ear: Secondary | ICD-10-CM

## 2017-01-07 DIAGNOSIS — I480 Paroxysmal atrial fibrillation: Secondary | ICD-10-CM | POA: Diagnosis not present

## 2017-01-07 DIAGNOSIS — Z7901 Long term (current) use of anticoagulants: Secondary | ICD-10-CM

## 2017-01-07 DIAGNOSIS — Z09 Encounter for follow-up examination after completed treatment for conditions other than malignant neoplasm: Secondary | ICD-10-CM | POA: Diagnosis not present

## 2017-01-07 DIAGNOSIS — R079 Chest pain, unspecified: Secondary | ICD-10-CM | POA: Diagnosis not present

## 2017-01-07 DIAGNOSIS — E039 Hypothyroidism, unspecified: Secondary | ICD-10-CM

## 2017-01-07 LAB — COAGUCHEK XS/INR WAIVED
INR: 2.2 — ABNORMAL HIGH (ref 0.9–1.1)
Prothrombin Time: 26.7 s

## 2017-01-07 MED ORDER — SPIRONOLACTONE 25 MG PO TABS: ORAL_TABLET | ORAL | 0 refills | Status: DC

## 2017-01-07 MED ORDER — LEVOTHYROXINE SODIUM 50 MCG PO TABS: 50.0000 ug | ORAL_TABLET | Freq: Every day | ORAL | 11 refills | Status: DC

## 2017-01-07 NOTE — Progress Notes (Addendum)
Subjective:    Patient ID: Rhonda Garcia, female    DOB: 1940-08-19, 76 y.o.   MRN: [id number]  HPI Pt presents to the office today for hospital follow up. PT went to the ED on 12/31/16 with chest pains and GERD. PT had negative cardiac enzymes and was kept overnight to be monitored.   PT has follow up appt with Dr. Percival Spanish, Cardiologists, on 01/09/17. PT states she is having dizziness and intermittent SOB with walking long distances and mild swelling in her feet.   Pt states she has had a great deal of stress over the last few weeks and contributes her symptoms to this. States her son found his wife dead and is getting married in "soon".   Pt's BP is elevated today, but she states she has not taken all of her medications today.   Requesting refill on levothyroxine today. Complaining of fatigue.   Review of Systems  Constitutional: Positive for fatigue.  Respiratory: Positive for shortness of breath.   Musculoskeletal: Positive for arthralgias.  Neurological: Positive for dizziness.  All other systems reviewed and are negative.      Objective:   Physical Exam  Constitutional: She is oriented to person, place, and time. She appears well-developed and well-nourished. No distress.  HENT:  Head: Normocephalic and atraumatic.  Right Ear: External ear normal.  Left Ear: External ear normal.  Nose: Nose normal.  Mouth/Throat: Oropharynx is clear and moist.  Eyes: Pupils are equal, round, and reactive to light.  Neck: Normal range of motion. Neck supple. No thyromegaly present.  Cardiovascular: Normal rate, regular rhythm, normal heart sounds and intact distal pulses.   No murmur heard. Pulmonary/Chest: Effort normal and breath sounds normal. No respiratory distress. She has no wheezes.  Abdominal: Soft. Bowel sounds are normal. She exhibits no distension. There is no tenderness.  Musculoskeletal: Normal range of motion. She exhibits no edema or tenderness.  Neurological: She is  alert and oriented to person, place, and time.  Skin: Skin is warm and dry.  Psychiatric: She has a normal mood and affect. Her behavior is normal. Judgment and thought content normal.  Vitals reviewed.     BP (!) 146/72   Pulse 61   Temp (!) 96.9 F (36.1 C) (Oral)   Ht _0  (1.575 m)   Wt 192 lb 9.6 oz (87.4 kg)   BMI 35.23 kg/m      Assessment & Plan:  1. Chest pain, unspecified type - CMP14+EGFR  2. Hospital discharge follow-up - CMP14+EGFR  3. Essential hypertension PT to take medications daily Low salt diet - spironolactone (ALDACTONE) 25 MG tablet; TAKE ONE (1) TABLET EACH DAY  Dispense: 90 tablet; Refill: 0 - CMP14+EGFR  4. Gastroesophageal reflux disease without esophagitis Continue Protonix -Diet discussed- Avoid fried, spicy, citrus foods, caffeine and alcohol -Do not eat 2-3 hours before bedtime -Encouraged small frequent meals -Avoid NSAID's - CMP14+EGFR  5. Hypothyroidism, unspecified type - levothyroxine (SYNTHROID) 50 MCG tablet; Take 1 tablet (50 mcg total) by mouth daily.  Dispense: 30 tablet; Refill: 11 - TSH - CMP14+EGFR  6. Benign paroxysmal positional vertigo, unspecified laterality [place] precaution discussed - CMP14+EGFR  7. Morbid obesity (HCC) - CMP14+EGFR  8. Paroxysmal atrial fibrillation (HCC) - CoaguChek XS/INR Waived  9. Long term current use of anticoagulant therapy Anticoagulation Warfarin Dose Instructions as of 01/07/2017      Dorene Grebe Tue Wed Thu Fri Sat   New Dose 7.5 mg 5 mg 7.5 mg 5 mg 7.5 mg  5 mg 7.5 mg    Description   INR-2.2, Continue warfarin 25m dose - Take 1 tablet Mondays, Wednesdays and Fridays.  Take 1 and 1/2 tablets all other days.      - CoaguChek XS/INR Waived   Continue all meds Labs pending Health Maintenance reviewed Diet and exercise encouraged RTO 3 months   CEvelina Dun FNP

## 2017-01-07 NOTE — Patient Instructions (Signed)
Nonspecific Chest Pain °Chest pain can be caused by many different conditions. There is always a chance that your pain could be related to something serious, such as a heart attack or a blood clot in your lungs. Chest pain can also be caused by conditions that are not life-threatening. If you have chest pain, it is very important to follow up with your health care provider. °What are the causes? °Causes of this condition include: °· Heartburn. °· Pneumonia or bronchitis. °· Anxiety or stress. °· Inflammation around your heart (pericarditis) or lung (pleuritis or pleurisy). °· A blood clot in your lung. °· A collapsed lung (pneumothorax). This can develop suddenly on its own (spontaneous pneumothorax) or from trauma to the chest. °· Shingles infection (varicella-zoster virus). °· Heart attack. °· Damage to the bones, muscles, and cartilage that make up your chest wall. This can include: °? Bruised bones due to injury. °? Strained muscles or cartilage due to frequent or repeated coughing or overwork. °? Fracture to one or more ribs. °? Sore cartilage due to inflammation (costochondritis). ° °What increases the risk? °Risk factors for this condition may include: °· Activities that increase your risk for trauma or injury to your chest. °· Respiratory infections or conditions that cause frequent coughing. °· Medical conditions or overeating that can cause heartburn. °· Heart disease or family history of heart disease. °· Conditions or health behaviors that increase your risk of developing a blood clot. °· Having had chicken pox (varicella zoster). ° °What are the signs or symptoms? °Chest pain can feel like: °· Burning or tingling on the surface of your chest or deep in your chest. °· Crushing, pressure, aching, or squeezing pain. °· Dull or sharp pain that is worse when you move, cough, or take a deep breath. °· Pain that is also felt in your back, neck, shoulder, or arm, or pain that spreads to any of these  areas. ° °Your chest pain may come and go, or it may stay constant. °How is this diagnosed? °Lab tests or other studies may be needed to find the cause of your pain. Your health care provider may have you take a test called an ECG (electrocardiogram). An ECG records your heartbeat patterns at the time the test is performed. You may also have other tests, such as: °· Transthoracic echocardiogram (TTE). In this test, sound waves are used to create a picture of the heart structures and to look at how blood flows through your heart. °· Transesophageal echocardiogram (TEE). This is a more advanced imaging test that takes images from inside your body. It allows your health care provider to see your heart in finer detail. °· Cardiac monitoring. This allows your health care provider to monitor your heart rate and rhythm in real time. °· Holter monitor. This is a portable device that records your heartbeat and can help to diagnose abnormal heartbeats. It allows your health care provider to track your heart activity for several days, if needed. °· Stress tests. These can be done through exercise or by taking medicine that makes your heart beat more quickly. °· Blood tests. °· Other imaging tests. ° °How is this treated? °Treatment depends on what is causing your chest pain. Treatment may include: °· Medicines. These may include: °? Acid blockers for heartburn. °? Anti-inflammatory medicine. °? Pain medicine for inflammatory conditions. °? Antibiotic medicine, if an infection is present. °? Medicines to dissolve blood clots. °? Medicines to treat coronary artery disease (CAD). °· Supportive care for conditions that   do not require medicines. This may include: °? Resting. °? Applying heat or cold packs to injured areas. °? Limiting activities until pain decreases. ° °Follow these instructions at home: °Medicines °· If you were prescribed an antibiotic, take it as told by your health care provider. Do not stop taking the  antibiotic even if you start to feel better. °· Take over-the-counter and prescription medicines only as told by your health care provider. °Lifestyle °· Do not use any products that contain nicotine or tobacco, such as cigarettes and e-cigarettes. If you need help quitting, ask your health care provider. °· Do not drink alcohol. °· Make lifestyle changes as directed by your health care provider. These may include: °? Getting regular exercise. Ask your health care provider to suggest some activities that are safe for you. °? Eating a heart-healthy diet. A registered dietitian can help you to learn healthy eating options. °? Maintaining a healthy weight. °? Managing diabetes, if necessary. °? Reducing stress, such as with yoga or relaxation techniques. °General instructions °· Avoid any activities that bring on chest pain. °· If heartburn is the cause for your chest pain, raise (elevate) the head of your bed about 6 inches (15 cm) by putting blocks under the legs. Sleeping with more pillows does not effectively relieve heartburn because it only changes the position of your head. °· Keep all follow-up visits as told by your health care provider. This is important. This includes any further testing if your chest pain does not go away. °Contact a health care provider if: °· Your chest pain does not go away. °· You have a rash with blisters on your chest. °· You have a fever. °· You have chills. °Get help right away if: °· Your chest pain is worse. °· You have a cough that gets worse, or you cough up blood. °· You have severe pain in your abdomen. °· You have severe weakness. °· You faint. °· You have sudden, unexplained chest discomfort. °· You have sudden, unexplained discomfort in your arms, back, neck, or jaw. °· You have shortness of breath at any time. °· You suddenly start to sweat, or your skin gets clammy. °· You feel nauseous or you vomit. °· You suddenly feel light-headed or dizzy. °· Your heart begins to beat  quickly, or it feels like it is skipping beats. °These symptoms may represent a serious problem that is an emergency. Do not wait to see if the symptoms will go away. Get medical help right away. Call your local emergency services (911 in the [place] Do not drive yourself to the hospital. °This information is not intended to replace advice given to you by your health care provider. Make sure you discuss any questions you have with your health care provider. °Document Released: 01/03/2005 Document Revised: 12/19/2015 Document Reviewed: 12/19/2015 °[employer] Interactive Patient Education © 2017 [employer] ° °

## 2017-01-07 NOTE — Addendum Note (Signed)
Addended by: Evelina Dun A on: 01/07/2017 03:42 PM   Modules accepted: Orders

## 2017-01-08 ENCOUNTER — Encounter: Payer: Self-pay | Admitting: Cardiology

## 2017-01-08 LAB — CMP14+EGFR
ALT: 17 IU/L (ref 0–32)
AST: 21 IU/L (ref 0–40)
Albumin/Globulin Ratio: 1.7 (ref 1.2–2.2)
Albumin: 4.1 g/dL (ref 3.5–4.8)
Alkaline Phosphatase: 89 IU/L (ref 39–117)
BUN/Creatinine Ratio: 12 (ref 12–28)
BUN: 11 mg/dL (ref 8–27)
Bilirubin Total: 0.7 mg/dL (ref 0.0–1.2)
CO2: 23 mmol/L (ref 20–29)
Calcium: 9.3 mg/dL (ref 8.7–10.3)
Chloride: 105 mmol/L (ref 96–106)
Creatinine, Ser: 0.9 mg/dL (ref 0.57–1.00)
GFR calc Af Amer: 72 mL/min/{1.73_m2} (ref >59)
GFR calc non Af Amer: 63 mL/min/{1.73_m2} (ref >59)
Globulin, Total: 2.4 g/dL (ref 1.5–4.5)
Glucose: 100 mg/dL — ABNORMAL HIGH (ref 65–99)
Potassium: 4.3 mmol/L (ref 3.5–5.2)
Sodium: 142 mmol/L (ref 134–144)
Total Protein: 6.5 g/dL (ref 6.0–8.5)

## 2017-01-08 LAB — TSH: TSH: 2.21 u[IU]/mL (ref 0.450–4.500)

## 2017-01-08 NOTE — Progress Notes (Signed)
HPI The patient presents for evaluation of known coronary disease and atrial fibrillation. Last year she had chest pain.  she was admitted.   Cath demonstrated nonobstructive disease in large vessels with an occluded PL off the RCA.  She had patent stents in the RI, RCA and LAD.  She was managed medically.  She returns for follow up.  She was at [facility] a couple of weeks ago with chest pain.   She ruled out. On careful questioning she's been under a lot of stress. Her daughter-in-law died suddenly. They're remodeling a house. She thinks she's been overly exhausted. She's been having shortness of breath but this sounds like it's chronic and unchanged from last year when she had her heart catheterization. She gets some chronic dyspnea walking to the mailbox. The pain she had when she went to the hospital was a constant 36 hour discomfort and there was no objective evidence of ischemia. She episodically get some discomfort now but this is unchanged from previous. She's not having any new or accelerated chest discomfort, neck or arm discomfort. She's not having any new palpitations, presyncope or syncope.  Allergies  Allergen Reactions  . Promethazine Hcl Anaphylaxis    Current Outpatient Prescriptions  Medication Sig Dispense Refill  . ALPRAZolam (XANAX) 0.5 MG tablet Take 1 tablet (0.5 mg total) by mouth at bedtime as needed. 30 tablet 4  . amLODipine (NORVASC) 10 MG tablet Take 1 tablet (10 mg total) by mouth daily. 90 tablet 3  . escitalopram (LEXAPRO) 10 MG tablet Take 1 tablet (10 mg total) by mouth daily. 90 tablet 3  . isosorbide mononitrate (IMDUR) 60 MG 24 hr tablet Take 1 tablet (60 mg total) by mouth daily. 90 tablet 3  . levothyroxine (SYNTHROID) 50 MCG tablet Take 1 tablet (50 mcg total) by mouth daily. 30 tablet 11  . linaclotide (LINZESS) 72 MCG capsule Take 1 capsule (72 mcg total) by mouth daily before breakfast. 16 capsule 0  . lisinopril-hydrochlorothiazide (PRINZIDE,ZESTORETIC)  20-12.5 MG per tablet Take 2 tablets by mouth daily. 180 tablet 6  . metFORMIN (GLUCOPHAGE-XR) 500 MG 24 hr tablet TAKE 2 TABLETS BY MOUTH DAILY WITH BREAKFAST 180 tablet 0  . metoprolol tartrate (LOPRESSOR) 50 MG tablet TAKE ONE TABLET BY MOUTH TWICE DAILY 180 tablet 0  . nitroGLYCERIN (NITROSTAT) 0.4 MG SL tablet Place 0.4 mg under the tongue every 5 (five) minutes as needed for chest pain.     . pantoprazole (PROTONIX) 40 MG tablet Take 1 tablet (40 mg total) by mouth 2 (two) times daily. 60 tablet 5  . sitaGLIPtin (JANUVIA) 100 MG tablet Take 1 tablet (100 mg total) by mouth daily. 28 tablet 0  . spironolactone (ALDACTONE) 25 MG tablet Take 25 mg by mouth daily.    . traMADol (ULTRAM) 50 MG tablet Take 1 tablet (50 mg total) by mouth every 12 (twelve) hours as needed. 60 tablet 5  . Vitamin D, Ergocalciferol, (DRISDOL) 50000 units CAPS capsule Take 1 capsule (50,000 Units total) by mouth every 7 (seven) days. 12 capsule 3  . warfarin (COUMADIN) 5 MG tablet TAKE 1 TO 1&1/2 TABLET DAILY AS DIRECTED 135 tablet 0   No current facility-administered medications for this visit.     Past Medical History:  Diagnosis Date  . A-fib ([place])   . Anal fissure    resolved   . Anemia   . Back pain    with left radiculopathy  . CAD (coronary artery disease)   . Cataract   .  DDD (degenerative disc disease)   . Depression   . Diabetes mellitus   . Diverticulosis   . Dyslipidemia   . Dysrhythmia    AFib  . Esophagitis   . GERD (gastroesophageal reflux disease)   . Hiatal hernia   . Hx of peptic ulcer   . Hyperlipidemia   . Hypertension   . Hypothyroidism   . Internal hemorrhoids 2017  . NSTEMI (non-ST elevated myocardial infarction) ([place]) 08/05/2015  . Osteopenia   . Sleep apnea    not using CPAP now, could not tolerate and PCP aware.  . Thyroid disease     Past Surgical History:  Procedure Laterality Date  . ABDOMINAL HYSTERECTOMY Bilateral 2001 - approximate  . APPENDECTOMY    .  BREAST REDUCTION SURGERY    . CARDIAC CATHETERIZATION N/A 08/08/2015   Procedure: Left Heart Cath and Coronary Angiography;  Surgeon: Burnell Blanks, MD;  Location: [place] [facility];  Service: Cardiovascular;  Laterality: N/A;  . CHOLECYSTECTOMY  2008 - approximate  . COLONOSCOPY  06/2007   Friable anal canal and anal papillae. Pancolonic diverticula. Normal terminal ileum. Status post segmental biopsy, descending colon biopsy showed minimal cryptitis. Biopsies felt to be  nonspecific but could be seen with NSAIDs. No features of inflammatory bowel disease. Stool studies were negative.  . COLONOSCOPY  03/21/2011   RMR: colonic polyps treated as described above, colonic diverticulosis (Tubular adenomas)  . COLONOSCOPY WITH PROPOFOL N/A 06/17/2014   RMR: Colonic diverticulosis. Multiple colonic polyps removed as described above.   . COLONOSCOPY WITH PROPOFOL N/A 03/19/2016   Procedure: COLONOSCOPY WITH PROPOFOL;  Surgeon: Daneil Dolin, MD;  [facility];  Service: Endoscopy;  Laterality: N/A;  8:45am  . CORONARY ANGIOPLASTY WITH STENT PLACEMENT     4 stents  . ESOPHAGEAL DILATION N/A 06/17/2014   Procedure: ESOPHAGEAL DILATION;  Surgeon: Daneil Dolin, MD;  Location: [facility];  Service: Endoscopy;  Laterality: N/A;  Maloney 73 Fr  . ESOPHAGOGASTRODUODENOSCOPY  06/2007   Small hiatal hernia  . ESOPHAGOGASTRODUODENOSCOPY  03/21/2011   RMR: normal esophagus- status post passage of Maloney dilator. Small hiatal hernia. Trivial antral and bulbar erosions  . ESOPHAGOGASTRODUODENOSCOPY (EGD) WITH PROPOFOL N/A 06/17/2014   RMR: Small hiatal hernia; otherwise normal EGD. Status post passage of a Maloney dilator. No explanation for patients right upper quadrant abdominal pain.   Marland Kitchen ESOPHAGOGASTRODUODENOSCOPY (EGD) WITH PROPOFOL N/A 03/19/2016   Procedure: ESOPHAGOGASTRODUODENOSCOPY (EGD) WITH PROPOFOL;  Surgeon: Daneil Dolin, MD;  [facility];  Service: Endoscopy;  Laterality:  N/A;  . KNEE SURGERY Left    arthroscopy  . LIPOMA EXCISION  03/17/2012   Procedure: EXCISION LIPOMA;  Surgeon: Gwenyth Ober, MD;  Location: [facility];  Service: General;  Laterality: Right;  excision of right lower back lipoma  . MALONEY DILATION N/A 03/19/2016   Procedure: Venia Minks DILATION;  Surgeon: Daneil Dolin, MD;  [facility];  Service: Endoscopy;  Laterality: N/A;  . POLYPECTOMY  06/17/2014   Procedure: POLYPECTOMY;  Surgeon: Daneil Dolin, MD;  Location: [facility];  Service: Endoscopy;;    ROS:  As stated in the HPI and negative for all other systems.  PHYSICAL EXAM BP 138/80   Pulse 60   Ht 5\' 2"  (1.575 m)   Wt 193 lb (87.5 kg)   BMI 35.30 kg/m  GENERAL:  Well appearing HEENT:  Pupils equal round and reactive, fundi not visualized, oral mucosa unremarkable, dentures NECK:  No jugular venous distention, waveform within normal limits, carotid upstroke brisk and symmetric, no bruits, no thyromegaly LUNGS:  Clear to auscultation bilaterally BACK:  No CVA tenderness CHEST:  Unremarkable HEART:  PMI not displaced or sustained,S1 and S2 within normal limits, no S3, no S4, no clicks, no rubs, no murmurs ABD:  Flat, positive bowel sounds normal in frequency in pitch, no bruits, no rebound, no guarding, no midline pulsatile mass, no hepatomegaly, no splenomegaly EXT:  2 plus pulses throughout, mild left greater than right nonpitting lower extremity edema, no cyanosis no clubbing, right wrist normal   EKG: 11/23/16   sinus rhythm, rate 70, axis within normal limits, intervals within normal limits, no acute ST-T wave changes.  ASSESSMENT AND PLAN  ATRIAL FIB  Ms. Takita Riecke has a CHA2DS2 - VASc score of 5 with a risk of stroke of 6.7%.  She was in sinus as above the last EKG and has had no symptomatic tachypalpitations. She tolerates anticoagulation. No change in therapy is indicated.  CAD, NATIVE VESSEL  She had disease as above.  Her symptoms  recently with hospitalization was not consistent with new ischemic symptoms. She said she's had no change in her pattern since her last catheterization last year. At this point no change in therapy is indicated. She needs continued risk reduction. I did review records from that recent hospitalization.    HYPERTENSION, UNSPECIFIED The blood pressure is at target.  No change in therapy is indicated.   HYPERLIPIDEMIA Lipids are as listed below. She is not at target.   She cannot recall what dose of Pravachol she is on.  However, she was on this in [place].  She is going to call back and clarify.  If she is on 40 mg I am going to increase to 80 mg and repeat a Lipid in 10 weeks.   Lab Results  Component Value Date   CHOL 154 11/05/2016   TRIG 140 11/05/2016   HDL 36 (L) 11/05/2016   LDLCALC 90 11/05/2016    DYSPNEA This is baseline.  No change in therapy or further studies are needed.    DM AIC was 7.8 earlier this year.  Not at target.   I will defer to Sharion Balloon, FNP    Consider empaglifolzin given the known morality benefit in patients with CAD.

## 2017-01-09 ENCOUNTER — Ambulatory Visit (INDEPENDENT_AMBULATORY_CARE_PROVIDER_SITE_OTHER): Payer: Medicare Other | Admitting: Cardiology

## 2017-01-09 ENCOUNTER — Encounter: Payer: Self-pay | Admitting: Cardiology

## 2017-01-09 VITALS — BP 138/80 | HR 60 | Ht 62.0 in | Wt 193.0 lb

## 2017-01-09 DIAGNOSIS — E785 Hyperlipidemia, unspecified: Secondary | ICD-10-CM | POA: Diagnosis not present

## 2017-01-09 DIAGNOSIS — I10 Essential (primary) hypertension: Secondary | ICD-10-CM | POA: Diagnosis not present

## 2017-01-09 DIAGNOSIS — I25119 Atherosclerotic heart disease of native coronary artery with unspecified angina pectoris: Secondary | ICD-10-CM | POA: Diagnosis not present

## 2017-01-09 DIAGNOSIS — R0602 Shortness of breath: Secondary | ICD-10-CM | POA: Diagnosis not present

## 2017-01-09 DIAGNOSIS — E118 Type 2 diabetes mellitus with unspecified complications: Secondary | ICD-10-CM

## 2017-01-09 MED ORDER — ISOSORBIDE MONONITRATE ER 60 MG PO TB24: 60.0000 mg | ORAL_TABLET | Freq: Every day | ORAL | 3 refills | Status: DC

## 2017-01-09 MED ORDER — PRAVASTATIN SODIUM 80 MG PO TABS: 80.0000 mg | ORAL_TABLET | Freq: Every evening | ORAL | 3 refills | Status: DC

## 2017-01-09 NOTE — Patient Instructions (Signed)

## 2017-01-09 NOTE — Addendum Note (Signed)
Addended by: Shellia Cleverly on: 01/09/2017 11:02 AM   Modules accepted: Orders

## 2017-01-10 ENCOUNTER — Ambulatory Visit: Payer: Medicare Other | Admitting: Cardiology

## 2017-01-10 ENCOUNTER — Other Ambulatory Visit: Payer: Self-pay | Admitting: Family

## 2017-01-10 ENCOUNTER — Telehealth: Payer: Self-pay | Admitting: Family

## 2017-01-10 ENCOUNTER — Other Ambulatory Visit: Payer: Self-pay | Admitting: *Deleted

## 2017-01-10 DIAGNOSIS — F411 Generalized anxiety disorder: Secondary | ICD-10-CM

## 2017-01-10 DIAGNOSIS — I10 Essential (primary) hypertension: Secondary | ICD-10-CM

## 2017-01-10 DIAGNOSIS — F320 Major depressive disorder, single episode, mild: Secondary | ICD-10-CM

## 2017-01-10 DIAGNOSIS — E039 Hypothyroidism, unspecified: Secondary | ICD-10-CM

## 2017-01-10 MED ORDER — NITROGLYCERIN 0.4 MG SL SUBL: 0.4000 mg | SUBLINGUAL_TABLET | SUBLINGUAL | 1 refills | Status: DC | PRN

## 2017-01-10 MED ORDER — SITAGLIPTIN PHOSPHATE 100 MG PO TABS: 100.0000 mg | ORAL_TABLET | Freq: Every day | ORAL | 1 refills | Status: DC

## 2017-01-10 MED ORDER — LISINOPRIL-HYDROCHLOROTHIAZIDE 20-12.5 MG PO TABS: 2.0000 | ORAL_TABLET | Freq: Every day | ORAL | 6 refills | Status: DC

## 2017-01-10 MED ORDER — AMLODIPINE BESYLATE 10 MG PO TABS: 10.0000 mg | ORAL_TABLET | Freq: Every day | ORAL | 3 refills | Status: DC

## 2017-01-10 MED ORDER — ESCITALOPRAM OXALATE 10 MG PO TABS: 10.0000 mg | ORAL_TABLET | Freq: Every day | ORAL | 3 refills | Status: DC

## 2017-01-10 MED ORDER — LEVOTHYROXINE SODIUM 50 MCG PO TABS: 50.0000 ug | ORAL_TABLET | Freq: Every day | ORAL | 1 refills | Status: DC

## 2017-01-10 NOTE — Telephone Encounter (Signed)
Medications sent to her pharmacy.

## 2017-01-10 NOTE — Telephone Encounter (Signed)
Please review and advise.

## 2017-01-10 NOTE — Telephone Encounter (Signed)
What is the name of the medication? Januvia 100 mg, Nitroglycerin 0.4 mg, Lisinopril 20-12.5, Levothyroxine 50 mg, Escitalopram 10 mg, Amlodipine 10 mg  Have you contacted your pharmacy to request a refill? YES  Which pharmacy would you like [facility] sent [facility]    Patient notified that their request is being sent to the clinical staff for review and that they should receive a call once it is complete. If they do not receive a call within 24 hours they can check with their pharmacy or our office.

## 2017-01-29 ENCOUNTER — Ambulatory Visit (INDEPENDENT_AMBULATORY_CARE_PROVIDER_SITE_OTHER): Payer: Medicare Other | Admitting: Family Medicine

## 2017-01-29 VITALS — BP 136/84 | HR 68 | Temp 97.0°F | Ht 62.0 in | Wt 188.0 lb

## 2017-01-29 DIAGNOSIS — Z87891 Personal history of nicotine dependence: Secondary | ICD-10-CM | POA: Diagnosis not present

## 2017-01-29 DIAGNOSIS — E7800 Pure hypercholesterolemia, unspecified: Secondary | ICD-10-CM | POA: Diagnosis not present

## 2017-01-29 DIAGNOSIS — I252 Old myocardial infarction: Secondary | ICD-10-CM | POA: Diagnosis not present

## 2017-01-29 DIAGNOSIS — I10 Essential (primary) hypertension: Secondary | ICD-10-CM | POA: Diagnosis not present

## 2017-01-29 DIAGNOSIS — I2510 Atherosclerotic heart disease of native coronary artery without angina pectoris: Secondary | ICD-10-CM | POA: Diagnosis not present

## 2017-01-29 DIAGNOSIS — R1084 Generalized abdominal pain: Secondary | ICD-10-CM | POA: Diagnosis not present

## 2017-01-29 DIAGNOSIS — Z79899 Other long term (current) drug therapy: Secondary | ICD-10-CM | POA: Diagnosis not present

## 2017-01-29 DIAGNOSIS — R197 Diarrhea, unspecified: Secondary | ICD-10-CM | POA: Diagnosis not present

## 2017-01-29 DIAGNOSIS — A084 Viral intestinal infection, unspecified: Secondary | ICD-10-CM | POA: Diagnosis not present

## 2017-01-29 DIAGNOSIS — Z7984 Long term (current) use of oral hypoglycemic drugs: Secondary | ICD-10-CM | POA: Diagnosis not present

## 2017-01-29 DIAGNOSIS — K529 Noninfective gastroenteritis and colitis, unspecified: Secondary | ICD-10-CM | POA: Diagnosis not present

## 2017-01-29 DIAGNOSIS — K219 Gastro-esophageal reflux disease without esophagitis: Secondary | ICD-10-CM | POA: Diagnosis not present

## 2017-01-29 DIAGNOSIS — E119 Type 2 diabetes mellitus without complications: Secondary | ICD-10-CM | POA: Diagnosis not present

## 2017-01-29 DIAGNOSIS — F419 Anxiety disorder, unspecified: Secondary | ICD-10-CM | POA: Diagnosis not present

## 2017-01-29 DIAGNOSIS — Z7901 Long term (current) use of anticoagulants: Secondary | ICD-10-CM | POA: Diagnosis not present

## 2017-01-29 LAB — CBC WITH DIFFERENTIAL/PLATELET
Basophils Absolute: 0.1 10*3/uL (ref 0.0–0.2)
Basos: 1 %
EOS (ABSOLUTE): 0.2 10*3/uL (ref 0.0–0.4)
Eos: 2 %
Hematocrit: 43.7 % (ref 34.0–46.6)
Hemoglobin: 14.9 g/dL (ref 11.1–15.9)
Immature Grans (Abs): 0 10*3/uL (ref 0.0–0.1)
Immature Granulocytes: 0 %
Lymphocytes Absolute: 1.5 10*3/uL (ref 0.7–3.1)
Lymphs: 18 %
MCH: 30.6 pg (ref 26.6–33.0)
MCHC: 34.1 g/dL (ref 31.5–35.7)
MCV: 90 fL (ref 79–97)
Monocytes Absolute: 0.6 10*3/uL (ref 0.1–0.9)
Monocytes: 7 %
Neutrophils Absolute: 5.9 10*3/uL (ref 1.4–7.0)
Neutrophils: 72 %
Platelets: 172 10*3/uL (ref 150–379)
RBC: 4.87 x10E6/uL (ref 3.77–5.28)
RDW: 15.2 % (ref 12.3–15.4)
WBC: 8.2 10*3/uL (ref 3.4–10.8)

## 2017-01-29 MED ORDER — ONDANSETRON 4 MG PO TBDP: 4.0000 mg | ORAL_TABLET | Freq: Three times a day (TID) | ORAL | 0 refills | Status: DC | PRN

## 2017-01-29 NOTE — Patient Instructions (Addendum)
I think that this is likely a viral gastroenteritis, especially given your cold symptoms.  However, we will keep in mind her history of diverticulitis.  I have ordered a CBC to evaluate for infection and a BMP to look for electrolyte status and evidence of dehydration.  I have prescribed you Zofran to take every 8 hours as needed for nausea and vomiting.  You were given your first dose today in office.  Your next dose should be around 8 PM.  If your symptoms worsen, you develop severe abdominal pain, fevers, blood in your vomit or stool, or are unable to stay hydrated, please seek medical attention in the nearest emergency department.

## 2017-01-29 NOTE — Progress Notes (Signed)
Subjective: CC: nausea/ vomiting PCP: Sharion Balloon, FNP YQM:VHQION Rhonda Garcia is a 76 y.o. female presenting to clinic today for:  1. Nausea, vomiting Patient reports onset of nausea and vomiting a couple of days ago.  She reports 5 episodes of nonbloody diarrhea today and 2 episodes of nonbloody nonbilious vomiting.  She reports belching.  She denies fevers or sick contacts.  No undercooked foods or foods that have been left out too long.  No new pets.  No recent travel.  She does endorse intermittent headache, occasionally productive cough, rhinorrhea and chest congestion which preceded GI symptoms.  She has been hydrating well and eating some food but notes that her stomach feels hour after eating solids.  Allergies  Allergen Reactions  . Promethazine Hcl Anaphylaxis   Past Medical History:  Diagnosis Date  . A-fib ([place])   . Anal fissure    resolved   . Anemia   . Back pain    with left radiculopathy  . CAD (coronary artery disease)   . Cataract   . DDD (degenerative disc disease)   . Depression   . Diabetes mellitus   . Diverticulosis   . Dyslipidemia   . Dysrhythmia    AFib  . Esophagitis   . GERD (gastroesophageal reflux disease)   . Hiatal hernia   . Hx of peptic ulcer   . Hyperlipidemia   . Hypertension   . Hypothyroidism   . Internal hemorrhoids 2017  . NSTEMI (non-ST elevated myocardial infarction) ([place]) 08/05/2015  . Osteopenia   . Sleep apnea    not using CPAP now, could not tolerate and PCP aware.  . Thyroid disease    Family History  Problem Relation Age of Onset  . Heart attack Mother   . Hypertension Mother   . Stroke Mother        Deceased, age 52  . Diabetes Sister   . Diabetes Sister   . Cancer Sister        ovarian  . Stroke Sister   . Diabetes Brother        also has a blood disorder   . Clotting disorder Brother   . Kidney disease Brother   . Heart disease Brother   . Melanoma Brother        deceased  . Coronary artery disease  Other   . Kidney disease Other   . Colon cancer Neg Hx     Current Outpatient Prescriptions:  .  ALPRAZolam (XANAX) 0.5 MG tablet, Take 1 tablet (0.5 mg total) by mouth at bedtime as needed., Disp: 30 tablet, Rfl: 4 .  amLODipine (NORVASC) 10 MG tablet, Take 1 tablet (10 mg total) by mouth daily., Disp: 90 tablet, Rfl: 3 .  escitalopram (LEXAPRO) 10 MG tablet, Take 1 tablet (10 mg total) by mouth daily., Disp: 90 tablet, Rfl: 3 .  isosorbide mononitrate (IMDUR) 60 MG 24 hr tablet, Take 1 tablet (60 mg total) by mouth daily., Disp: 90 tablet, Rfl: 3 .  levothyroxine (SYNTHROID) 50 MCG tablet, Take 1 tablet (50 mcg total) by mouth daily., Disp: 90 tablet, Rfl: 1 .  linaclotide (LINZESS) 72 MCG capsule, Take 1 capsule (72 mcg total) by mouth daily before breakfast., Disp: 16 capsule, Rfl: 0 .  lisinopril-hydrochlorothiazide (PRINZIDE,ZESTORETIC) 20-12.5 MG tablet, Take 2 tablets by mouth daily., Disp: 180 tablet, Rfl: 6 .  metFORMIN (GLUCOPHAGE-XR) 500 MG 24 hr tablet, TAKE 2 TABLETS BY MOUTH DAILY WITH BREAKFAST, Disp: 180 tablet, Rfl: 0 .  metoprolol  tartrate (LOPRESSOR) 50 MG tablet, TAKE ONE TABLET BY MOUTH TWICE DAILY, Disp: 180 tablet, Rfl: 1 .  nitroGLYCERIN (NITROSTAT) 0.4 MG SL tablet, Place 1 tablet (0.4 mg total) under the tongue every 5 (five) minutes as needed for chest pain., Disp: 20 tablet, Rfl: 1 .  ondansetron (ZOFRAN ODT) 4 MG disintegrating tablet, Take 1 tablet (4 mg total) by mouth every 8 (eight) hours as needed for nausea or vomiting., Disp: 20 tablet, Rfl: 0 .  pantoprazole (PROTONIX) 40 MG tablet, Take 1 tablet (40 mg total) by mouth 2 (two) times daily., Disp: 60 tablet, Rfl: 5 .  pravastatin (PRAVACHOL) 80 MG tablet, Take 1 tablet (80 mg total) by mouth every evening., Disp: 90 tablet, Rfl: 3 .  sitaGLIPtin (JANUVIA) 100 MG tablet, Take 1 tablet (100 mg total) by mouth daily., Disp: 90 tablet, Rfl: 1 .  spironolactone (ALDACTONE) 25 MG tablet, Take 25 mg by mouth daily.,  Disp: , Rfl:  .  traMADol (ULTRAM) 50 MG tablet, Take 1 tablet (50 mg total) by mouth every 12 (twelve) hours as needed., Disp: 60 tablet, Rfl: 5 .  Vitamin D, Ergocalciferol, (DRISDOL) 50000 units CAPS capsule, Take 1 capsule (50,000 Units total) by mouth every 7 (seven) days., Disp: 12 capsule, Rfl: 3 .  warfarin (COUMADIN) 5 MG tablet, TAKE 1 TO 1&1/2 TABLET DAILY AS DIRECTED, Disp: 135 tablet, Rfl: 0  Social Hx: former smoker.  Health Maintenance: flu shot   ROS: Per HPI  Objective: Office vital signs reviewed. BP 136/84   Pulse 68   Temp (!) 97 F (36.1 C) (Oral)   Ht 5\' 2"  (1.575 m)   Wt 188 lb (85.3 kg)   BMI 34.39 kg/m   Physical Examination:  General: Awake, alert, obese, nontoxic, No acute distress HEENT: Normal, MMM, sclera white Cardio: regular rate and rhythm, S1S2 heard, no murmurs appreciated Pulm: clear to auscultation bilaterally, no wheezes, rhonchi or rales; normal work of breathing on room air GI: soft, non-tender, non-distended, bowel sounds present and hyperactive x4, no hepatomegaly, no splenomegaly, no masses  Assessment/ Plan: 76 y.o. female   1. Viral gastroenteritis Physical exam unremarkable.  No red flag signs.  She is afebrile with normal vitals.  She was given a dose of Zofran ODT here in office with good response.  This is been prescribed to her.  I have a low suspicion for diverticulitis, especially given the absence of fever, blood in stool and focalized abdominal pain.  However, she does have a history of this and for this reason will obtain CBC and BMP.  If CBC reveals a leukocytosis, would consider pursuing additional imaging to rule out diverticulitis.  Strict return precautions and reasons for emergent evaluation in the emergency department were reviewed with the patient.  Home care instructions reviewed with the patient.  She voiced good understanding. - ondansetron (ZOFRAN ODT) 4 MG disintegrating tablet; Take 1 tablet (4 mg total) by mouth  every 8 (eight) hours as needed for nausea or vomiting.  Dispense: 20 tablet; Refill: 0 - CBC with Differential - Basic Metabolic Panel   Orders Placed This Encounter  Procedures  . CBC with Differential  . Basic Metabolic Panel   Meds ordered this encounter  Medications  . ondansetron (ZOFRAN ODT) 4 MG disintegrating tablet    Sig: Take 1 tablet (4 mg total) by mouth every 8 (eight) hours as needed for nausea or vomiting.    Dispense:  20 tablet    Refill:  0  Janora Norlander, DO [facility] [phone]

## 2017-01-30 LAB — BASIC METABOLIC PANEL
BUN/Creatinine Ratio: 19 (ref 12–28)
BUN: 20 mg/dL (ref 8–27)
CO2: 26 mmol/L (ref 20–29)
Calcium: 10.3 mg/dL (ref 8.7–10.3)
Chloride: 102 mmol/L (ref 96–106)
Creatinine, Ser: 1.07 mg/dL — ABNORMAL HIGH (ref 0.57–1.00)
GFR calc Af Amer: 59 mL/min/{1.73_m2} — ABNORMAL LOW (ref >59)
GFR calc non Af Amer: 51 mL/min/{1.73_m2} — ABNORMAL LOW (ref >59)
Glucose: 158 mg/dL — ABNORMAL HIGH (ref 65–99)
Potassium: 4.4 mmol/L (ref 3.5–5.2)
Sodium: 143 mmol/L (ref 134–144)

## 2017-02-07 ENCOUNTER — Encounter: Payer: Self-pay | Admitting: Family

## 2017-02-07 ENCOUNTER — Ambulatory Visit (INDEPENDENT_AMBULATORY_CARE_PROVIDER_SITE_OTHER): Payer: Medicare Other | Admitting: Family

## 2017-02-07 VITALS — BP 110/63 | HR 55 | Temp 97.4°F | Ht 62.0 in | Wt 189.0 lb

## 2017-02-07 DIAGNOSIS — I10 Essential (primary) hypertension: Secondary | ICD-10-CM | POA: Diagnosis not present

## 2017-02-07 DIAGNOSIS — I152 Hypertension secondary to endocrine disorders: Secondary | ICD-10-CM

## 2017-02-07 DIAGNOSIS — E039 Hypothyroidism, unspecified: Secondary | ICD-10-CM

## 2017-02-07 DIAGNOSIS — E785 Hyperlipidemia, unspecified: Secondary | ICD-10-CM

## 2017-02-07 DIAGNOSIS — M1990 Unspecified osteoarthritis, unspecified site: Secondary | ICD-10-CM | POA: Diagnosis not present

## 2017-02-07 DIAGNOSIS — E1169 Type 2 diabetes mellitus with other specified complication: Secondary | ICD-10-CM

## 2017-02-07 DIAGNOSIS — K219 Gastro-esophageal reflux disease without esophagitis: Secondary | ICD-10-CM

## 2017-02-07 DIAGNOSIS — E1165 Type 2 diabetes mellitus with hyperglycemia: Secondary | ICD-10-CM | POA: Diagnosis not present

## 2017-02-07 DIAGNOSIS — I214 Non-ST elevation (NSTEMI) myocardial infarction: Secondary | ICD-10-CM

## 2017-02-07 DIAGNOSIS — F331 Major depressive disorder, recurrent, moderate: Secondary | ICD-10-CM | POA: Diagnosis not present

## 2017-02-07 DIAGNOSIS — E1159 Type 2 diabetes mellitus with other circulatory complications: Secondary | ICD-10-CM

## 2017-02-07 DIAGNOSIS — I480 Paroxysmal atrial fibrillation: Secondary | ICD-10-CM | POA: Diagnosis not present

## 2017-02-07 DIAGNOSIS — F411 Generalized anxiety disorder: Secondary | ICD-10-CM

## 2017-02-07 DIAGNOSIS — Z7901 Long term (current) use of anticoagulants: Secondary | ICD-10-CM

## 2017-02-07 DIAGNOSIS — I25110 Atherosclerotic heart disease of native coronary artery with unstable angina pectoris: Secondary | ICD-10-CM

## 2017-02-07 LAB — BAYER DCA HB A1C WAIVED: HB A1C (BAYER DCA - WAIVED): 7.1 % — ABNORMAL HIGH (ref <7.0)

## 2017-02-07 LAB — COAGUCHEK XS/INR WAIVED
INR: 5.2 — ABNORMAL HIGH (ref 0.9–1.1)
Prothrombin Time: 62.7 s

## 2017-02-07 MED ORDER — TRAMADOL HCL 50 MG PO TABS: 50.0000 mg | ORAL_TABLET | Freq: Two times a day (BID) | ORAL | 5 refills | Status: DC | PRN

## 2017-02-07 MED ORDER — ALPRAZOLAM 0.5 MG PO TABS: 0.5000 mg | ORAL_TABLET | Freq: Every evening | ORAL | 4 refills | Status: DC | PRN

## 2017-02-07 NOTE — Progress Notes (Signed)
Subjective:    Patient ID: Rhonda Garcia, female    DOB: [birth year], 76 y.o.   MRN: [id number]  Pt presents to the office today for chronic follow up. PT is followed by Cardiologists annually for MI and A Fib.   Hypertension  This is a chronic problem. The current episode started more than 1 year ago. The problem has been resolved since onset. The problem is controlled. Associated symptoms include anxiety, blurred vision and malaise/fatigue. Pertinent negatives include no peripheral edema or shortness of breath. Risk factors for coronary artery disease include diabetes mellitus, dyslipidemia, family history, obesity, post-menopausal state and sedentary lifestyle. The current treatment provides moderate improvement. Hypertensive end-organ damage includes CAD/MI. There is no history of kidney disease. Identifiable causes of hypertension include a thyroid problem.  Diabetes  She presents for her follow-up diabetic visit. She has type 2 diabetes mellitus. Her disease course has been stable. Hypoglycemia symptoms include nervousness/anxiousness. Associated symptoms include blurred vision and fatigue. Pertinent negatives for diabetes include no foot paresthesias and no visual change. There are no hypoglycemic complications. Symptoms are stable. Diabetic complications include heart disease. Risk factors for coronary artery disease include diabetes mellitus, dyslipidemia, obesity, hypertension and sedentary lifestyle. She is following a generally unhealthy diet. (Does not take blood glucose at home) Eye exam is current.  Hyperlipidemia  This is a chronic problem. The current episode started more than 1 year ago. The problem is uncontrolled. Recent lipid tests were reviewed and are high. Exacerbating diseases include obesity. Pertinent negatives include no shortness of breath. Current antihyperlipidemic treatment includes statins. The current treatment provides moderate improvement of lipids. Risk factors for  coronary artery disease include diabetes mellitus, dyslipidemia, obesity and post-menopausal.  Gastroesophageal Reflux  She reports no belching, no coughing or no heartburn. This is a chronic problem. The current episode started more than 1 year ago. The problem occurs occasionally. The problem has been waxing and waning. Associated symptoms include fatigue. Risk factors include obesity. She has tried a PPI for the symptoms. The treatment provided moderate relief.  Anxiety  Presents for follow-up visit. Symptoms include depressed mood, excessive worry, irritability, nervous/anxious behavior and restlessness. Patient reports no shortness of breath. Symptoms occur occasionally. The severity of symptoms is moderate. The quality of sleep is good.    Depression         This is a chronic problem.  The current episode started more than 1 year ago.   The onset quality is gradual.   The problem occurs intermittently.  The problem has been waxing and waning since onset.  Associated symptoms include fatigue, irritable, restlessness, decreased interest and sad.  Associated symptoms include no helplessness and no hopelessness.     The symptoms are aggravated by nothing.  Past treatments include SSRIs - Selective serotonin reuptake inhibitors.  Compliance with treatment is good.  Past medical history includes thyroid problem and anxiety.   Arthritis  Presents for follow-up visit. She complains of pain and stiffness. Affected locations include the right knee and left knee. Her pain is at a severity of 8/10. Associated symptoms include diarrhea and fatigue.  Thyroid Problem  Presents for follow-up visit. Symptoms include anxiety, constipation, depressed mood, diarrhea and fatigue. Patient reports no visual change. The symptoms have been stable. Her past medical history is significant for hyperlipidemia.  Constipation  This is a chronic problem. The current episode started more than 1 year ago. The problem has been  resolved since onset. Associated symptoms include diarrhea. She has tried laxatives  for the symptoms. The treatment provided moderate relief.      Review of Systems  Constitutional: Positive for fatigue, irritability and malaise/fatigue.  Eyes: Positive for blurred vision.  Respiratory: Negative for cough and shortness of breath.   Gastrointestinal: Positive for constipation and diarrhea. Negative for heartburn.  Musculoskeletal: Positive for arthritis and stiffness.  Psychiatric/Behavioral: Positive for depression. The patient is nervous/anxious.   All other systems reviewed and are negative.      Objective:   Physical Exam  Constitutional: She is oriented to person, place, and time. She appears well-developed and well-nourished. She is irritable. No distress.  HENT:  Head: Normocephalic and atraumatic.  Right Ear: External ear normal.  Left Ear: External ear normal.  Nose: Nose normal.  Mouth/Throat: Oropharynx is clear and moist.  Eyes: Pupils are equal, round, and reactive to light.  Neck: Normal range of motion. Neck supple. No thyromegaly present.  Cardiovascular: Normal rate, regular rhythm, normal heart sounds and intact distal pulses.   No murmur heard. Pulmonary/Chest: Effort normal and breath sounds normal. No respiratory distress. She has no wheezes.  Abdominal: Soft. Bowel sounds are normal. She exhibits no distension. There is no tenderness.  Musculoskeletal: Normal range of motion. She exhibits no edema or tenderness.  Neurological: She is alert and oriented to person, place, and time.  Skin: Skin is warm and dry.  Psychiatric: She has a normal mood and affect. Her behavior is normal. Judgment and thought content normal.  Vitals reviewed.    BP 110/63   Pulse (!) 55   Temp (!) 97.4 F (36.3 C) (Oral)   Ht 5' 2"  (1.575 m)   Wt 189 lb (85.7 kg)   BMI 34.57 kg/m      Assessment & Plan:  1. Hyperlipidemia associated with type 2 diabetes mellitus (HCC) -  CMP14+EGFR - Lipid panel  2. Hypertension associated with diabetes ([place]) - CMP14+EGFR  3. Osteoarthritis, unspecified osteoarthritis type, unspecified site - traMADol (ULTRAM) 50 MG tablet; Take 1 tablet (50 mg total) by mouth every 12 (twelve) hours as needed.  Dispense: 60 tablet; Refill: 5 - CMP14+EGFR  4. Moderate episode of recurrent major depressive disorder (HCC) - CMP14+EGFR  5. Paroxysmal atrial fibrillation (HCC) - CoaguChek XS/INR Waived - CMP14+EGFR  6. Gastroesophageal reflux disease without esophagitis - CMP14+EGFR  7. Morbid obesity (HCC) - CMP14+EGFR  8. GAD (generalized anxiety disorder) - ALPRAZolam (XANAX) 0.5 MG tablet; Take 1 tablet (0.5 mg total) by mouth at bedtime as needed.  Dispense: 30 tablet; Refill: 4 - CMP14+EGFR  9. Coronary artery disease involving native coronary artery of native heart with unstable angina pectoris ([place]) - CMP14+EGFR  10. NSTEMI (non-ST elevated myocardial infarction) ([place]) - CMP14+EGFR  11. Type 2 diabetes mellitus with hyperglycemia, without long-term current use of insulin (HCC) - Bayer DCA Hb A1c Waived - CMP14+EGFR - Microalbumin / creatinine urine ratio  12. Hypothyroidism, unspecified type  - CMP14+EGFR - TSH  13. Long term current use of anticoagulant therapy Anticoagulation Warfarin Dose Instructions as of 02/07/2017      Dorene Grebe Tue Wed Thu Fri Sat   New Dose 7.5 mg 5 mg 5 mg 7.5 mg 5 mg 5 mg 5 mg    Description   INR-5.2,  Hold warfarin dose today, Continue warfarin 32m dose - Take 1 1/2 tables tablet Sunday and  Wednesdays.  Take 1 tablets all other days.      - CoaguChek XS/INR Waived - CMP14+EGFR   Continue all meds Labs pending Health  Maintenance reviewed Diet and exercise encouraged RTO 2 weeks to recheck INR  Evelina Dun, FNP

## 2017-02-07 NOTE — Patient Instructions (Addendum)
Diabetes Mellitus and Food It is important for you to manage your blood sugar (glucose) level. Your blood glucose level can be greatly affected by what you eat. Eating healthier foods in the appropriate amounts throughout the day at about the same time each day will help you control your blood glucose level. It can also help slow or prevent worsening of your diabetes mellitus. Healthy eating may even help you improve the level of your blood pressure and reach or maintain a healthy weight. General recommendations for healthful eating and cooking habits include:  Eating meals and snacks regularly. Avoid going long periods of time without eating to lose weight.  Eating a diet that consists mainly of plant-based foods, such as fruits, vegetables, nuts, legumes, and whole grains.  Using low-heat cooking methods, such as baking, instead of high-heat cooking methods, such as deep frying.  Work with your dietitian to make sure you understand how to use the Nutrition Facts information on food labels. How can food affect me? Carbohydrates Carbohydrates affect your blood glucose level more than any other type of food. Your dietitian will help you determine how many carbohydrates to eat at each meal and teach you how to count carbohydrates. Counting carbohydrates is important to keep your blood glucose at a healthy level, especially if you are using insulin or taking certain medicines for diabetes mellitus. Alcohol Alcohol can cause sudden decreases in blood glucose (hypoglycemia), especially if you use insulin or take certain medicines for diabetes mellitus. Hypoglycemia can be a life-threatening condition. Symptoms of hypoglycemia (sleepiness, dizziness, and disorientation) are similar to symptoms of having too much alcohol. If your health care provider has given you approval to drink alcohol, do so in moderation and use the following guidelines:  Women should not have more than one drink per day, and men  should not have more than two drinks per day. One drink is equal to: ? 12 oz of beer. ? 5 oz of wine. ? 1 oz of hard liquor.  Do not drink on an empty stomach.  Keep yourself hydrated. Have water, diet soda, or unsweetened iced tea.  Regular soda, juice, and other mixers might contain a lot of carbohydrates and should be counted.  What foods are not recommended? As you make food choices, it is important to remember that all foods are not the same. Some foods have fewer nutrients per serving than other foods, even though they might have the same number of calories or carbohydrates. It is difficult to get your body what it needs when you eat foods with fewer nutrients. Examples of foods that you should avoid that are high in calories and carbohydrates but low in nutrients include:  Trans fats (most processed foods list trans fats on the Nutrition Facts label).  Regular soda.  Juice.  Candy.  Sweets, such as cake, pie, doughnuts, and cookies.  Fried foods.  What foods can I eat? Eat nutrient-rich foods, which will nourish your body and keep you healthy. The food you should eat also will depend on several factors, including:  The calories you need.  The medicines you take.  Your weight.  Your blood glucose level.  Your blood pressure level.  Your cholesterol level.  You should eat a variety of foods, including:  Protein. ? Lean cuts of meat. ? Proteins low in saturated fats, such as fish, egg whites, and beans. Avoid processed meats.  Fruits and vegetables. ? Fruits and vegetables that may help control blood glucose levels, such as apples,   mangoes, and yams.  Dairy products. ? Choose fat-free or low-fat dairy products, such as milk, yogurt, and cheese.  Grains, bread, pasta, and rice. ? Choose whole grain products, such as multigrain bread, whole oats, and brown rice. These foods may help control blood pressure.  Fats. ? Foods containing healthful fats, such as  nuts, avocado, olive oil, canola oil, and fish.  Does everyone with diabetes mellitus have the same meal plan? Because every person with diabetes mellitus is different, there is not one meal plan that works for everyone. It is very important that you meet with a dietitian who will help you create a meal plan that is just right for you. This information is not intended to replace advice given to you by your health care provider. Make sure you discuss any questions you have with your health care provider. Document Released: 12/21/2004 Document Revised: 09/01/2015 Document Reviewed: 02/20/2013 Elsevier Interactive Patient Education  2017 [place].  Anticoagulation Warfarin Dose Instructions as of 02/07/2017      Dorene Grebe Tue Wed Thu Fri Sat   New Dose 7.5 mg 5 mg 5 mg 7.5 mg 5 mg 5 mg 5 mg    Description   INR-5.2,  Hold warfarin dose today, Continue warfarin 5mg  dose - Take 1 1/2 tables tablet Sunday and  Wednesdays.  Take 1 tablets all other days.

## 2017-02-08 LAB — CMP14+EGFR
ALT: 15 IU/L (ref 0–32)
AST: 16 IU/L (ref 0–40)
Albumin/Globulin Ratio: 1.9 (ref 1.2–2.2)
Albumin: 3.8 g/dL (ref 3.5–4.8)
Alkaline Phosphatase: 86 IU/L (ref 39–117)
BUN/Creatinine Ratio: 14 (ref 12–28)
BUN: 14 mg/dL (ref 8–27)
Bilirubin Total: 0.5 mg/dL (ref 0.0–1.2)
CO2: 27 mmol/L (ref 20–29)
Calcium: 9.2 mg/dL (ref 8.7–10.3)
Chloride: 103 mmol/L (ref 96–106)
Creatinine, Ser: 0.99 mg/dL (ref 0.57–1.00)
GFR calc Af Amer: 64 mL/min/{1.73_m2} (ref >59)
GFR calc non Af Amer: 56 mL/min/{1.73_m2} — ABNORMAL LOW (ref >59)
Globulin, Total: 2 g/dL (ref 1.5–4.5)
Glucose: 118 mg/dL — ABNORMAL HIGH (ref 65–99)
Potassium: 4 mmol/L (ref 3.5–5.2)
Sodium: 142 mmol/L (ref 134–144)
Total Protein: 5.8 g/dL — ABNORMAL LOW (ref 6.0–8.5)

## 2017-02-08 LAB — LIPID PANEL
Chol/HDL Ratio: 3.9 ratio (ref 0.0–4.4)
Cholesterol, Total: 124 mg/dL (ref 100–199)
HDL: 32 mg/dL — ABNORMAL LOW (ref >39)
LDL Calculated: 65 mg/dL (ref 0–99)
Triglycerides: 135 mg/dL (ref 0–149)
VLDL Cholesterol Cal: 27 mg/dL (ref 5–40)

## 2017-02-08 LAB — MICROALBUMIN / CREATININE URINE RATIO
Creatinine, Urine: 127.6 mg/dL
Microalb/Creat Ratio: 3.6 mg/g creat (ref 0.0–30.0)
Microalbumin, Urine: 4.6 ug/mL

## 2017-02-08 LAB — TSH: TSH: 2.35 u[IU]/mL (ref 0.450–4.500)

## 2017-02-14 DIAGNOSIS — E119 Type 2 diabetes mellitus without complications: Secondary | ICD-10-CM | POA: Diagnosis not present

## 2017-03-05 ENCOUNTER — Ambulatory Visit: Payer: Medicare Other | Admitting: Family Medicine

## 2017-03-05 ENCOUNTER — Encounter: Payer: Self-pay | Admitting: Family Medicine

## 2017-03-05 VITALS — BP 150/55 | HR 44 | Temp 98.4°F | Ht 62.0 in | Wt 192.0 lb

## 2017-03-05 DIAGNOSIS — J0100 Acute maxillary sinusitis, unspecified: Secondary | ICD-10-CM

## 2017-03-05 DIAGNOSIS — R52 Pain, unspecified: Secondary | ICD-10-CM | POA: Diagnosis not present

## 2017-03-05 LAB — VERITOR FLU A/B WAIVED
Influenza A: NEGATIVE
Influenza B: NEGATIVE

## 2017-03-05 MED ORDER — AMOXICILLIN-POT CLAVULANATE 875-125 MG PO TABS: 1.0000 | ORAL_TABLET | Freq: Two times a day (BID) | ORAL | 0 refills | Status: DC

## 2017-03-05 MED ORDER — ALBUTEROL SULFATE (2.5 MG/3ML) 0.083% IN NEBU: 2.5000 mg | INHALATION_SOLUTION | Freq: Four times a day (QID) | RESPIRATORY_TRACT | 1 refills | Status: DC | PRN

## 2017-03-05 NOTE — Progress Notes (Signed)
   HPI  Patient presents today here for acute illness.  Patient states that she began having cough, nasal congestion, headache, body aches, and chills about 2 days ago.  She had an episode of dizziness lightheadedness yesterday.  She states that the body aches and malaise are so severe that she could not go to work last night.  She is tolerating food and fluids like usual. She has mild shortness of breath.  She has a nebulizer at home that she would like to try.  PMH: Smoking status noted ROS: Per HPI  Objective: BP (!) 150/55   Pulse (!) 44   Temp 98.4 F (36.9 C) (Oral)   Ht 5\' 2"  (1.575 m)   Wt 192 lb (87.1 kg)   SpO2 97%   BMI 35.12 kg/m  Gen: NAD, alert, cooperative with exam HEENT: NCAT, oropharynx moist and clear, nares clear, TMs normal bilaterally, tenderness to palpation of bilateral maxillary sinuses CV: Regular rhythm, bradycardia Resp: CTABL, no wheezes, non-labored Abd: SNTND, BS present, no guarding or organomegaly Ext: No edema, warm Neuro: Alert and oriented, No gross deficits  Assessment and plan:  #Acute sinusitis With possible influenza as well, however flu was negative today Treating with Augmentin, discussed usual course of illness Note for work. Discussed supportive care, return to clinic with any concerns  Albuterol nebulizer solution also given with mild shortness of breath.     Orders Placed This Encounter  Procedures  . Veritor Flu A/B Waived    Order Specific Question:   Source    Answer:   nasal    Meds ordered this encounter  Medications  . amoxicillin-clavulanate (AUGMENTIN) 875-125 MG tablet    Sig: Take 1 tablet by mouth 2 (two) times daily.    Dispense:  20 tablet    Refill:  0  . albuterol (PROVENTIL) (2.5 MG/3ML) 0.083% nebulizer solution    Sig: Take 3 mLs (2.5 mg total) by nebulization every 6 (six) hours as needed for wheezing or shortness of breath.    Dispense:  150 mL    Refill:  [place], MD [place] 03/05/2017, 9:02 AM

## 2017-03-05 NOTE — Patient Instructions (Signed)
Great to see you!   Sinusitis, Adult Sinusitis is soreness and inflammation of your sinuses. Sinuses are hollow spaces in the bones around your face. They are located:  Around your eyes.  In the middle of your forehead.  Behind your nose.  In your cheekbones.  Your sinuses and nasal passages are lined with a stringy fluid (mucus). Mucus normally drains out of your sinuses. When your nasal tissues get inflamed or swollen, the mucus can get trapped or blocked so air cannot flow through your sinuses. This lets bacteria, viruses, and funguses grow, and that leads to infection. Follow these instructions at home: Medicines  Take, use, or apply over-the-counter and prescription medicines only as told by your doctor. These may include nasal sprays.  If you were prescribed an antibiotic medicine, take it as told by your doctor. Do not stop taking the antibiotic even if you start to feel better. Hydrate and Humidify  Drink enough water to keep your pee (urine) clear or pale yellow.  Use a cool mist humidifier to keep the humidity level in your home above 50%.  Breathe in steam for 10-15 minutes, 3-4 times a day or as told by your doctor. You can do this in the bathroom while a hot shower is running.  Try not to spend time in cool or dry air. Rest  Rest as much as possible.  Sleep with your head raised (elevated).  Make sure to get enough sleep each night. General instructions  Put a warm, moist washcloth on your face 3-4 times a day or as told by your doctor. This will help with discomfort.  Wash your hands often with soap and water. If there is no soap and water, use hand sanitizer.  Do not smoke. Avoid being around people who are smoking (secondhand smoke).  Keep all follow-up visits as told by your doctor. This is important. Contact a doctor if:  You have a fever.  Your symptoms get worse.  Your symptoms do not get better within 10 days. Get help right away if:  You  have a very bad headache.  You cannot stop throwing up (vomiting).  You have pain or swelling around your face or eyes.  You have trouble seeing.  You feel confused.  Your neck is stiff.  You have trouble breathing. This information is not intended to replace advice given to you by your health care provider. Make sure you discuss any questions you have with your health care provider. Document Released: 09/12/2007 Document Revised: 11/20/2015 Document Reviewed: 01/19/2015 Elsevier Interactive Patient Education  2018 [employer]  

## 2017-03-25 ENCOUNTER — Encounter: Payer: Self-pay | Admitting: *Deleted

## 2017-03-25 ENCOUNTER — Ambulatory Visit (INDEPENDENT_AMBULATORY_CARE_PROVIDER_SITE_OTHER): Payer: Medicare Other | Admitting: *Deleted

## 2017-03-25 ENCOUNTER — Ambulatory Visit (INDEPENDENT_AMBULATORY_CARE_PROVIDER_SITE_OTHER): Payer: Medicare Other

## 2017-03-25 VITALS — BP 135/66 | HR 71 | Ht 59.5 in | Wt 191.0 lb

## 2017-03-25 DIAGNOSIS — Z0000 Encounter for general adult medical examination without abnormal findings: Secondary | ICD-10-CM

## 2017-03-25 DIAGNOSIS — Z780 Asymptomatic menopausal state: Secondary | ICD-10-CM | POA: Diagnosis not present

## 2017-03-25 DIAGNOSIS — Z13820 Encounter for screening for osteoporosis: Secondary | ICD-10-CM | POA: Diagnosis not present

## 2017-03-25 NOTE — Patient Instructions (Signed)
  [name] , Thank you for taking time to come for your Medicare Wellness Visit. I appreciate your ongoing commitment to your health goals. Please review the following plan we discussed and let me know if I can assist you in the future.   These are the goals we discussed: Goals    . Exercise 150 min/wk Moderate Activity     Increase physical activity to 150 min a week.       This is a list of the screening recommended for you and due dates:  Health Maintenance  Topic Date Due  . Eye exam for diabetics  05/17/2017  . Complete foot exam   08/06/2017  . Hemoglobin A1C  08/07/2017  . DEXA scan (bone density measurement)  09/08/2017  . Colon Cancer Screening  03/20/2019  . Tetanus Vaccine  12/19/2022  . Flu Shot  Completed  . Pneumonia vaccines  Completed

## 2017-03-25 NOTE — Progress Notes (Signed)
Subjective:   Rhonda Garcia is a 76 y.o. female who presents for a subsequent Medicare Annual Wellness Visit. Ms Rhonda Garcia is accompanied by her fiance today.   Review of Systems    Reports that her health is about the same as last year  Musculoskeletal: back and multiple joint pain due to arthritis. Takes Tramadol when really bothering her and it works well.   Cardiac Risk Factors include: advanced age (>56men, >14 women);diabetes mellitus;dyslipidemia;obesity (BMI >30kg/m2);sedentary lifestyle;hypertension;family history of premature cardiovascular disease  Other systems negative today.     Objective:    Today's Vitals   03/25/17 0958 03/25/17 1018  BP: 135/66   Pulse: 71   Weight: 191 lb (86.6 kg)   Height: 4' 11.5" (1.511 m)   PainSc:  5    Body mass index is 37.93 kg/m.  Advanced Directives 03/25/2017 03/21/2016 03/19/2016 03/14/2016 08/05/2015 03/21/2015 06/17/2014  Does Patient Have a Medical Advance Directive? No No No No No No -  Would patient like information on creating a medical advance directive? No - Patient declined No - Patient declined - No - Patient declined - Yes - Educational materials given No - patient declined information  Pre-existing out of facility DNR order (yellow form or pink MOST form) - - - - - - -  Has talked with her daughter and she knows her wishes.   Current Medications (verified) Outpatient Encounter Medications as of 03/25/2017  Medication Sig  . albuterol (PROVENTIL) (2.5 MG/3ML) 0.083% nebulizer solution Take 3 mLs (2.5 mg total) by nebulization every 6 (six) hours as needed for wheezing or shortness of breath.  . ALPRAZolam (XANAX) 0.5 MG tablet Take 1 tablet (0.5 mg total) by mouth at bedtime as needed.  Marland Kitchen amLODipine (NORVASC) 10 MG tablet Take 1 tablet (10 mg total) by mouth daily.  Marland Kitchen escitalopram (LEXAPRO) 10 MG tablet Take 1 tablet (10 mg total) by mouth daily.  . isosorbide mononitrate (IMDUR) 60 MG 24 hr tablet Take 1 tablet (60 mg  total) by mouth daily.  Marland Kitchen levothyroxine (SYNTHROID) 50 MCG tablet Take 1 tablet (50 mcg total) by mouth daily.  Marland Kitchen linaclotide (LINZESS) 72 MCG capsule Take 1 capsule (72 mcg total) by mouth daily before breakfast.  . lisinopril-hydrochlorothiazide (PRINZIDE,ZESTORETIC) 20-12.5 MG tablet Take 2 tablets by mouth daily.  . metFORMIN (GLUCOPHAGE-XR) 500 MG 24 hr tablet TAKE 2 TABLETS BY MOUTH DAILY WITH BREAKFAST  . metoprolol tartrate (LOPRESSOR) 50 MG tablet TAKE ONE TABLET BY MOUTH TWICE DAILY  . nitroGLYCERIN (NITROSTAT) 0.4 MG SL tablet Place 1 tablet (0.4 mg total) under the tongue every 5 (five) minutes as needed for chest pain.  . pantoprazole (PROTONIX) 40 MG tablet Take 1 tablet (40 mg total) by mouth 2 (two) times daily.  . pravastatin (PRAVACHOL) 80 MG tablet Take 1 tablet (80 mg total) by mouth every evening.  . sitaGLIPtin (JANUVIA) 100 MG tablet Take 1 tablet (100 mg total) by mouth daily.  Marland Kitchen spironolactone (ALDACTONE) 25 MG tablet Take 25 mg by mouth daily.  . traMADol (ULTRAM) 50 MG tablet Take 1 tablet (50 mg total) by mouth every 12 (twelve) hours as needed.  . Vitamin D, Ergocalciferol, (DRISDOL) 50000 units CAPS capsule Take 1 capsule (50,000 Units total) by mouth every 7 (seven) days.  Marland Kitchen warfarin (COUMADIN) 5 MG tablet TAKE 1 TO 1&1/2 TABLET DAILY AS DIRECTED  . amoxicillin-clavulanate (AUGMENTIN) 875-125 MG tablet Take 1 tablet by mouth 2 (two) times daily.   No facility-administered encounter medications on file  as of 03/25/2017.     Allergies (verified) Promethazine hcl   History: Past Medical History:  Diagnosis Date  . A-fib ([place])   . Anal fissure    resolved   . Anemia   . Back pain    with left radiculopathy  . CAD (coronary artery disease)   . Cataract   . DDD (degenerative disc disease)   . Depression   . Diabetes mellitus   . Diverticulosis   . Dyslipidemia   . Dysrhythmia    AFib  . Esophagitis   . GERD (gastroesophageal reflux disease)   .  Hiatal hernia   . Hx of peptic ulcer   . Hyperlipidemia   . Hypertension   . Hypothyroidism   . Internal hemorrhoids 2017  . NSTEMI (non-ST elevated myocardial infarction) ([place]) 08/05/2015  . Osteopenia   . Sleep apnea    not using CPAP now, could not tolerate and PCP aware.  . Thyroid disease    Past Surgical History:  Procedure Laterality Date  . ABDOMINAL HYSTERECTOMY Bilateral 2001 - approximate  . APPENDECTOMY    . BREAST REDUCTION SURGERY    . CARDIAC CATHETERIZATION N/A 08/08/2015   Procedure: Left Heart Cath and Coronary Angiography;  Surgeon: Burnell Blanks, MD;  Location: [facility];  Service: Cardiovascular;  Laterality: N/A;  . CHOLECYSTECTOMY  2008 - approximate  . COLONOSCOPY  06/2007   Friable anal canal and anal papillae. Pancolonic diverticula. Normal terminal ileum. Status post segmental biopsy, descending colon biopsy showed minimal cryptitis. Biopsies felt to be  nonspecific but could be seen with NSAIDs. No features of inflammatory bowel disease. Stool studies were negative.  . COLONOSCOPY  03/21/2011   RMR: colonic polyps treated as described above, colonic diverticulosis (Tubular adenomas)  . COLONOSCOPY WITH PROPOFOL N/A 06/17/2014   RMR: Colonic diverticulosis. Multiple colonic polyps removed as described above.   . COLONOSCOPY WITH PROPOFOL N/A 03/19/2016   Procedure: COLONOSCOPY WITH PROPOFOL;  Surgeon: Daneil Dolin, MD;  [facility];  Service: Endoscopy;  Laterality: N/A;  8:45am  . CORONARY ANGIOPLASTY WITH STENT PLACEMENT     4 stents  . ESOPHAGEAL DILATION N/A 06/17/2014   Procedure: ESOPHAGEAL DILATION;  Surgeon: Daneil Dolin, MD;  Location: [facility];  Service: Endoscopy;  Laterality: N/A;  Maloney 31 Fr  . ESOPHAGOGASTRODUODENOSCOPY  06/2007   Small hiatal hernia  . ESOPHAGOGASTRODUODENOSCOPY  03/21/2011   RMR: normal esophagus- status post passage of Maloney dilator. Small hiatal hernia. Trivial antral and bulbar erosions  .  ESOPHAGOGASTRODUODENOSCOPY (EGD) WITH PROPOFOL N/A 06/17/2014   RMR: Small hiatal hernia; otherwise normal EGD. Status post passage of a Maloney dilator. No explanation for patients right upper quadrant abdominal pain.   Marland Kitchen ESOPHAGOGASTRODUODENOSCOPY (EGD) WITH PROPOFOL N/A 03/19/2016   Procedure: ESOPHAGOGASTRODUODENOSCOPY (EGD) WITH PROPOFOL;  Surgeon: Daneil Dolin, MD;  [facility];  Service: Endoscopy;  Laterality: N/A;  . KNEE SURGERY Left    arthroscopy  . LIPOMA EXCISION  03/17/2012   Procedure: EXCISION LIPOMA;  Surgeon: Gwenyth Ober, MD;  Location: [facility];  Service: General;  Laterality: Right;  excision of right lower back lipoma  . MALONEY DILATION N/A 03/19/2016   Procedure: Venia Minks DILATION;  Surgeon: Daneil Dolin, MD;  [facility];  Service: Endoscopy;  Laterality: N/A;  . POLYPECTOMY  06/17/2014   Procedure: POLYPECTOMY;  Surgeon: Daneil Dolin, MD;  Location: [facility];  Service: Endoscopy;;   Family History  Problem Relation Age of Onset  . Heart attack Mother   . Hypertension Mother   . Stroke Mother        Deceased, age 68  . Diabetes Sister   . Diabetes Sister   . Cancer Sister        ovarian  . Stroke Sister   . Diabetes Brother        also has a blood disorder   . Clotting disorder Brother   . Kidney disease Brother   . Heart disease Brother   . Melanoma Brother        deceased  . Coronary artery disease Other   . Kidney disease Other   . Diabetes Son   . Hypertension Son   . Colon cancer Neg Hx    Social History   Socioeconomic History  . Marital status: Widowed    Spouse name: Not on file  . Number of children: 4  . Years of education: 6  . Highest education level: 6th grade  Social Needs  . Financial resource strain: Not hard at all  . Food insecurity - worry: Never true  . Food insecurity - inability: Never true  . Transportation needs - medical: No  . Transportation needs - non-medical: No    Occupational History  . Occupation: Retired    Comment: Charity fundraiser  Tobacco Use  . Smoking status: Former Smoker    Packs/day: 0.50    Years: 5.00    Pack years: 2.50    Types: Cigarettes    Last attempt to quit: 03/13/1981    Years since quitting: 36.0  . Smokeless tobacco: Never Used  . Tobacco comment: quit 30 +years ago  Substance and Sexual Activity  . Alcohol use: Yes    Alcohol/week: 0.6 oz    Types: 1 Glasses of wine per week  . Drug use: No  . Sexual activity: Yes    Birth control/protection: Post-menopausal  Other Topics Concern  . Not on file  Social History Narrative   Widowed but is remarrying in July 2019. Has 4 adult children. Lives in a house with fiance.     Tobacco Counseling Counseling given: Not Answered Comment: quit 30 +years ago   Clinical Intake:   Pain : 0-10 Pain Score: 5  Pain Type: Chronic pain Pain Location: (S) Other (Comment)(Multiple arthralgias) Pain Descriptors / Indicators: Aching Pain Onset: More than a month ago Pain Frequency: Constant Pain Relieving Factors: rest and Tramadol Effect of Pain on Daily Activities: Mild  Pain Relieving Factors: rest and Tramadol  Nutritional Status: BMI > 30  Obese Nutritional Risks: None Diabetes: Yes CBG done?: No Did pt. bring in CBG monitor from home?: No  How often do you need to have someone help you when you read instructions, pamphlets, or other written materials from your doctor or pharmacy?: 1 - Never What is the last grade level you completed in school?: 6th  Interpreter Needed?: No  Information entered by :: Chong Sicilian, RN   Activities of Daily Living In your present state of health, do you have any difficulty performing the following activities: 03/25/2017  Hearing? Y  Comment Has some hearing deficit but audilogist said that she wasn't quite ready for hearing aids  Vision? N  Comment uses reading glasses otherwise no trouble with vision. Sees Dr Rona Ravens for exams and  last exam was this year.   Difficulty concentrating or making decisions? Y  Comment Has noticed some difficulty with her memory. Sometimes she will  put things away and forget where she put them and may forget that she put things on the stove.   Walking or climbing stairs? N  Comment No steps at home  Dressing or bathing? N  Doing errands, shopping? N  Preparing Food and eating ? N  Using the Toilet? N  In the past six months, have you accidently leaked urine? Y  Comment Some during the night  Do you have problems with loss of bowel control? N  Managing your Medications? N  Comment Keeps them in the original bottles  Managing your Finances? N  Housekeeping or managing your Housekeeping? N  Some recent data might be hidden     Immunizations and Health Maintenance Immunization History  Administered Date(s) Administered  . Influenza, High Dose Seasonal PF 02/07/2015, 01/21/2017  . Influenza,inj,Quad PF,6+ Mos 01/19/2013, 05/20/2014, 01/10/2016  . Pneumococcal Conjugate-13 08/13/2013  . Pneumococcal Polysaccharide-23 02/07/2015  . Tdap 12/18/2012   There are no preventive care reminders to display for this patient.  Patient Care Team: Sharion Balloon, FNP as PCP - General (Nurse Practitioner) Daneil Dolin, MD (Gastroenterology) Minus Breeding, MD as Consulting Physician (Cardiology) Raeanne Gathers, AUD as Audiologist (Audiology) Melina Schools, OD as Consulting Physician (Optometry)  ED to [facility] admission in October at [facility] for URI/respiratory distress. No surgeries this past year.     Assessment:   This is a routine wellness examination for [place].  Hearing/Vision screen No deficits noted during visit. Eye exam is up to date.  Dietary issues and exercise activities discussed: Current Exercise Habits: Home exercise routine, Type of exercise: Other - see comments(Dancing), Time (Minutes): 60, Frequency (Times/Week): 3, Weekly Exercise (Minutes/Week): 180,  Intensity: Moderate, Exercise limited by: cardiac condition(s);respiratory conditions(s)  Goals    . Exercise 150 min/wk Moderate Activity     Increase physical activity to 150 min a week.      Depression Screen PHQ 2/9 Scores 03/25/2017 03/05/2017 02/07/2017 01/07/2017 11/23/2016 11/05/2016 08/06/2016  PHQ - 2 Score 0 0 0 0 0 0 0  PHQ- 9 Score - - - - - - -    Fall Risk Fall Risk  03/25/2017 03/05/2017 02/07/2017 01/07/2017 11/23/2016  Falls in the past year? No No No No No  Number falls in past yr: - - - - -  Comment - - - - -  Injury with Fall? - - - - -  Comment - - - - -  Risk Factor Category  - - - - -  Follow up - - - - -    Cognitive Function: MMSE - Mini Mental State Exam 03/25/2017 03/21/2016 03/21/2015  Orientation to time 5 5 3   Orientation to Place 5 5 5   Registration 3 3 3   Attention/ Calculation 2 2 2   Recall 2 2 3   Language- name 2 objects 2 2 2   Language- repeat 1 1 1   Language- follow 3 step command 3 3 3   Language- read & follow direction 0 1 1  Write a sentence 1 1 1   Copy design 0 1 0  Total score 24 26 24   Probably normal test since consistent with previous scores      Screening Tests Health Maintenance  Topic Date Due  . OPHTHALMOLOGY EXAM  05/17/2017  . FOOT EXAM  08/06/2017  . HEMOGLOBIN A1C  08/07/2017  . DEXA SCAN  09/08/2017  . COLONOSCOPY  03/20/2019  . TETANUS/TDAP  12/19/2022  . INFLUENZA VACCINE  Completed  . PNA vac Low Risk  Adult  Completed     Plan:   Bone Density scan done today F/u with PCP scheduled for Feb 2019 Increase physical activity as tolerated. Aim for 150 min of moderate activity a week   I have personally reviewed and noted the following in the patient's chart:   . Medical and social history . Use of alcohol, tobacco or illicit drugs  . Current medications and supplements . Functional ability and status . Nutritional status . Physical activity . Advanced directives . List of other  physicians . Hospitalizations, surgeries, and ER visits in previous 12 months . Vitals . Screenings to include cognitive, depression, and falls . Referrals and appointments  In addition, I have reviewed and discussed with patient certain preventive protocols, quality metrics, and best practice recommendations. A written personalized care plan for preventive services as well as general preventive health recommendations were provided to patient.     Chong Sicilian, RN  03/25/2017

## 2017-04-24 ENCOUNTER — Encounter: Payer: Self-pay | Admitting: *Deleted

## 2017-05-21 ENCOUNTER — Ambulatory Visit: Payer: Medicare Other | Admitting: Family

## 2017-05-21 DIAGNOSIS — E119 Type 2 diabetes mellitus without complications: Secondary | ICD-10-CM | POA: Diagnosis not present

## 2017-05-22 ENCOUNTER — Encounter: Payer: Self-pay | Admitting: Family

## 2017-05-22 ENCOUNTER — Ambulatory Visit: Payer: Medicare Other | Admitting: Family

## 2017-05-22 VITALS — BP 126/69 | HR 57 | Temp 97.1°F | Ht 59.5 in | Wt 193.2 lb

## 2017-05-22 DIAGNOSIS — E785 Hyperlipidemia, unspecified: Secondary | ICD-10-CM | POA: Diagnosis not present

## 2017-05-22 DIAGNOSIS — E039 Hypothyroidism, unspecified: Secondary | ICD-10-CM | POA: Diagnosis not present

## 2017-05-22 DIAGNOSIS — I152 Hypertension secondary to endocrine disorders: Secondary | ICD-10-CM

## 2017-05-22 DIAGNOSIS — E1169 Type 2 diabetes mellitus with other specified complication: Secondary | ICD-10-CM

## 2017-05-22 DIAGNOSIS — I10 Essential (primary) hypertension: Secondary | ICD-10-CM

## 2017-05-22 DIAGNOSIS — E1159 Type 2 diabetes mellitus with other circulatory complications: Secondary | ICD-10-CM

## 2017-05-22 DIAGNOSIS — M1990 Unspecified osteoarthritis, unspecified site: Secondary | ICD-10-CM | POA: Diagnosis not present

## 2017-05-22 DIAGNOSIS — E1165 Type 2 diabetes mellitus with hyperglycemia: Secondary | ICD-10-CM

## 2017-05-22 DIAGNOSIS — I25110 Atherosclerotic heart disease of native coronary artery with unstable angina pectoris: Secondary | ICD-10-CM

## 2017-05-22 DIAGNOSIS — E559 Vitamin D deficiency, unspecified: Secondary | ICD-10-CM

## 2017-05-22 DIAGNOSIS — I480 Paroxysmal atrial fibrillation: Secondary | ICD-10-CM

## 2017-05-22 DIAGNOSIS — Z7901 Long term (current) use of anticoagulants: Secondary | ICD-10-CM | POA: Diagnosis not present

## 2017-05-22 LAB — COAGUCHEK XS/INR WAIVED
INR: 1.3 — ABNORMAL HIGH (ref 0.9–1.1)
Prothrombin Time: 15.3 s

## 2017-05-22 LAB — BAYER DCA HB A1C WAIVED: HB A1C (BAYER DCA - WAIVED): 7.3 % — ABNORMAL HIGH (ref <7.0)

## 2017-05-22 NOTE — Progress Notes (Signed)
Subjective:    Patient ID: Rhonda Garcia, female    DOB: 08/19/1940, 77 y.o.   MRN: [id number]  Pt presents to the office today for chronic follow up. PT is followed by Cardiologists annually for MI and A Fib.   Pt also to check INR.  Hyperlipidemia  This is a chronic problem. The current episode started more than 1 year ago. The problem is controlled. Recent lipid tests were reviewed and are normal. Exacerbating diseases include obesity. Pertinent negatives include no shortness of breath. Current antihyperlipidemic treatment includes statins. The current treatment provides moderate improvement of lipids. Risk factors for coronary artery disease include diabetes mellitus, dyslipidemia, hypertension, obesity and a sedentary lifestyle.  Diabetes  She presents for her follow-up diabetic visit. She has type 2 diabetes mellitus. Her disease course has been stable. There are no hypoglycemic associated symptoms. Associated symptoms include foot paresthesias. Pertinent negatives for diabetes include no blurred vision. There are no hypoglycemic complications. Symptoms are stable. Diabetic complications include heart disease. Pertinent negatives for diabetic complications include no CVA. Risk factors for coronary artery disease include dyslipidemia, diabetes mellitus, hypertension, obesity and sedentary lifestyle. She is following a generally unhealthy diet.  Hypertension  This is a chronic problem. The current episode started more than 1 year ago. The problem has been resolved since onset. The problem is controlled. Associated symptoms include malaise/fatigue. Pertinent negatives include no blurred vision, peripheral edema or shortness of breath. Risk factors for coronary artery disease include diabetes mellitus, dyslipidemia, obesity and sedentary lifestyle. The current treatment provides moderate improvement. Hypertensive end-organ damage includes kidney disease and CAD/MI. There is no history of CVA.       Review of Systems  Constitutional: Positive for malaise/fatigue.  Eyes: Negative for blurred vision.  Respiratory: Negative for shortness of breath.   All other systems reviewed and are negative.      Objective:   Physical Exam  Constitutional: She is oriented to person, place, and time. She appears well-developed and well-nourished. No distress.  HENT:  Head: Normocephalic and atraumatic.  Right Ear: External ear normal.  Left Ear: External ear normal.  Nose: Nose normal.  Mouth/Throat: Oropharynx is clear and moist.  Eyes: Pupils are equal, round, and reactive to light.  Neck: Normal range of motion. Neck supple. No thyromegaly present.  Cardiovascular: Normal rate, regular rhythm, normal heart sounds and intact distal pulses.  No murmur heard. Pulmonary/Chest: Effort normal and breath sounds normal. No respiratory distress. She has no wheezes.  Abdominal: Soft. Bowel sounds are normal. She exhibits no distension. There is no tenderness.  Musculoskeletal: Normal range of motion. She exhibits no edema or tenderness.  Neurological: She is alert and oriented to person, place, and time.  Skin: Skin is warm and dry.  Psychiatric: She has a normal mood and affect. Her behavior is normal. Judgment and thought content normal.  Vitals reviewed.    BP 126/69   Pulse (!) 57   Temp (!) 97.1 F (36.2 C) (Oral)   Ht 4' 11.5" (1.511 m)   Wt 193 lb 3.2 oz (87.6 kg)   BMI 38.37 kg/m      Assessment & Plan:  1. Hypertension associated with diabetes ([place]) - CMP14+EGFR  2. Hyperlipidemia associated with type 2 diabetes mellitus ([place]) - CMP14+EGFR  3. Type 2 diabetes mellitus with hyperglycemia, without long-term current use of insulin ([facility]) - CMP14+EGFR - Bayer DCA Hb A1c Waived - Ambulatory referral to Ophthalmology  4. Hypothyroidism, unspecified type - CMP14+EGFR  5.  Osteoarthritis, unspecified osteoarthritis type, unspecified site - CMP14+EGFR  6. Paroxysmal  atrial fibrillation ([facility]) - CoaguChek XS/INR Waived - CMP14+EGFR  7. Coronary artery disease involving native coronary artery of native heart with unstable angina pectoris (HCC) - CMP14+EGFR  8. Morbid obesity ([facility]) - CMP14+EGFR  9. Vitamin D deficiency - CMP14+EGFR - VITAMIN D 25 Hydroxy (Vit-D Deficiency, Fractures)  10. Long term current use of anticoagulant therapy Description   INR-1.3,   Take 1 1/2 (7.5 mg) tables tablet Sunday, Wednesday, and Friday.  Take 1 tablets (41m) all other days.       Continue all meds Labs pending Health Maintenance reviewed Diet and exercise encouraged RTO 3 months for me and follow up with MSharyn Lullin 2 weeks to recheck INR  CEvelina Dun FNP

## 2017-05-23 ENCOUNTER — Other Ambulatory Visit: Payer: Self-pay | Admitting: Family

## 2017-05-23 LAB — CMP14+EGFR
ALT: 10 IU/L (ref 0–32)
AST: 14 IU/L (ref 0–40)
Albumin/Globulin Ratio: 2 (ref 1.2–2.2)
Albumin: 4.1 g/dL (ref 3.5–4.8)
Alkaline Phosphatase: 82 IU/L (ref 39–117)
BUN/Creatinine Ratio: 16 (ref 12–28)
BUN: 15 mg/dL (ref 8–27)
Bilirubin Total: 0.5 mg/dL (ref 0.0–1.2)
CO2: 23 mmol/L (ref 20–29)
Calcium: 9.5 mg/dL (ref 8.7–10.3)
Chloride: 103 mmol/L (ref 96–106)
Creatinine, Ser: 0.95 mg/dL (ref 0.57–1.00)
GFR calc Af Amer: 68 mL/min/{1.73_m2} (ref >59)
GFR calc non Af Amer: 59 mL/min/{1.73_m2} — ABNORMAL LOW (ref >59)
Globulin, Total: 2.1 g/dL (ref 1.5–4.5)
Glucose: 120 mg/dL — ABNORMAL HIGH (ref 65–99)
Potassium: 4.3 mmol/L (ref 3.5–5.2)
Sodium: 142 mmol/L (ref 134–144)
Total Protein: 6.2 g/dL (ref 6.0–8.5)

## 2017-05-23 LAB — VITAMIN D 25 HYDROXY (VIT D DEFICIENCY, FRACTURES): Vit D, 25-Hydroxy: 29.3 ng/mL — ABNORMAL LOW (ref 30.0–100.0)

## 2017-05-23 MED ORDER — VITAMIN D (ERGOCALCIFEROL) 1.25 MG (50000 UNIT) PO CAPS: 50000.0000 [IU] | ORAL_CAPSULE | ORAL | 3 refills | Status: DC

## 2017-05-23 NOTE — Progress Notes (Signed)
er

## 2017-06-07 ENCOUNTER — Ambulatory Visit (INDEPENDENT_AMBULATORY_CARE_PROVIDER_SITE_OTHER): Payer: Medicare Other

## 2017-06-07 ENCOUNTER — Ambulatory Visit: Payer: Medicare Other | Admitting: Family Medicine

## 2017-06-07 ENCOUNTER — Encounter: Payer: Self-pay | Admitting: Family Medicine

## 2017-06-07 VITALS — BP 131/61 | HR 60 | Temp 96.8°F | Ht 59.0 in | Wt 189.0 lb

## 2017-06-07 DIAGNOSIS — R42 Dizziness and giddiness: Secondary | ICD-10-CM | POA: Diagnosis not present

## 2017-06-07 DIAGNOSIS — R0602 Shortness of breath: Secondary | ICD-10-CM

## 2017-06-07 DIAGNOSIS — M79671 Pain in right foot: Secondary | ICD-10-CM

## 2017-06-07 MED ORDER — ALBUTEROL SULFATE HFA 108 (90 BASE) MCG/ACT IN AERS
2.0000 | INHALATION_SPRAY | Freq: Four times a day (QID) | RESPIRATORY_TRACT | 0 refills | Status: DC | PRN
Start: 2017-06-07 — End: 2018-04-30

## 2017-06-07 NOTE — Progress Notes (Signed)
Subjective: CC: Foot pain/ "head swimming" PCP: Sharion Balloon, FNP ZWC:HENIDP Rhonda Garcia is a 77 y.o. female presenting to clinic today for:  1.  Foot pain Patient reports a one-week history of right foot pain.  She points over the medial midfoot as the source of pain.  Denies preceding injury, swelling, erythema, ecchymosis, discoloration.  She notes the pain seems to be worse with walking.  She has been soaking her feet and Epson salts with some improvement.  She does note intermittent numbness and tingling in bilateral feet.  She has a history of diabetes.  2. Dizziness Patient notes that she has been lightheaded for over a week and will occasionally get nauseated secondary to lightheadedness.  She reports that dizziness seems to happen when she is changing positions.  Denies loss of consciousness, chest pain, shortness of breath, recent illness, diarrhea, vomiting or decreased p.o. Intake.  3.  Dyspnea on exertion Patient reports a long-standing history of dyspnea on exertion.  She reports that she was previously on albuterol with good improvement in shortness of breath.  She states that dyspnea on exertion occurs with activity such as going up and down stairs or walking down the road to her son's house.  Denies hemoptysis, coughing.  No lower extremity swelling.  She has a history of atrial fibrillation and coronary artery disease.  Last checkup with her cardiologist was good per her report.    ROS: Per HPI  Allergies  Allergen Reactions  . Promethazine Hcl Anaphylaxis   Past Medical History:  Diagnosis Date  . A-fib ([place])   . Anal fissure    resolved   . Anemia   . Back pain    with left radiculopathy  . CAD (coronary artery disease)   . Cataract   . DDD (degenerative disc disease)   . Depression   . Diabetes mellitus   . Diverticulosis   . Dyslipidemia   . Dysrhythmia    AFib  . Esophagitis   . GERD (gastroesophageal reflux disease)   . Hiatal hernia   . Hx of  peptic ulcer   . Hyperlipidemia   . Hypertension   . Hypothyroidism   . Internal hemorrhoids 2017  . NSTEMI (non-ST elevated myocardial infarction) ([place]) 08/05/2015  . Osteopenia   . Sleep apnea    not using CPAP now, could not tolerate and PCP aware.  . Thyroid disease     Current Outpatient Medications:  .  albuterol (PROVENTIL) (2.5 MG/3ML) 0.083% nebulizer solution, Take 3 mLs (2.5 mg total) by nebulization every 6 (six) hours as needed for wheezing or shortness of breath., Disp: 150 mL, Rfl: 1 .  ALPRAZolam (XANAX) 0.5 MG tablet, Take 1 tablet (0.5 mg total) by mouth at bedtime as needed., Disp: 30 tablet, Rfl: 4 .  amLODipine (NORVASC) 10 MG tablet, Take 1 tablet (10 mg total) by mouth daily., Disp: 90 tablet, Rfl: 3 .  escitalopram (LEXAPRO) 10 MG tablet, Take 1 tablet (10 mg total) by mouth daily., Disp: 90 tablet, Rfl: 3 .  isosorbide mononitrate (IMDUR) 60 MG 24 hr tablet, Take 1 tablet (60 mg total) by mouth daily., Disp: 90 tablet, Rfl: 3 .  levothyroxine (SYNTHROID) 50 MCG tablet, Take 1 tablet (50 mcg total) by mouth daily., Disp: 90 tablet, Rfl: 1 .  linaclotide (LINZESS) 72 MCG capsule, Take 1 capsule (72 mcg total) by mouth daily before breakfast., Disp: 16 capsule, Rfl: 0 .  lisinopril-hydrochlorothiazide (PRINZIDE,ZESTORETIC) 20-12.5 MG tablet, Take 2 tablets by mouth  daily., Disp: 180 tablet, Rfl: 6 .  metFORMIN (GLUCOPHAGE-XR) 500 MG 24 hr tablet, TAKE 2 TABLETS BY MOUTH DAILY WITH BREAKFAST, Disp: 180 tablet, Rfl: 0 .  metoprolol tartrate (LOPRESSOR) 50 MG tablet, TAKE ONE TABLET BY MOUTH TWICE DAILY, Disp: 180 tablet, Rfl: 1 .  nitroGLYCERIN (NITROSTAT) 0.4 MG SL tablet, Place 1 tablet (0.4 mg total) under the tongue every 5 (five) minutes as needed for chest pain., Disp: 20 tablet, Rfl: 1 .  pantoprazole (PROTONIX) 40 MG tablet, Take 1 tablet (40 mg total) by mouth 2 (two) times daily., Disp: 60 tablet, Rfl: 5 .  sitaGLIPtin (JANUVIA) 100 MG tablet, Take 1 tablet (100  mg total) by mouth daily., Disp: 90 tablet, Rfl: 1 .  spironolactone (ALDACTONE) 25 MG tablet, Take 25 mg by mouth daily., Disp: , Rfl:  .  traMADol (ULTRAM) 50 MG tablet, Take 1 tablet (50 mg total) by mouth every 12 (twelve) hours as needed., Disp: 60 tablet, Rfl: 5 .  Vitamin D, Ergocalciferol, (DRISDOL) 50000 units CAPS capsule, Take 1 capsule (50,000 Units total) by mouth every 7 (seven) days., Disp: 12 capsule, Rfl: 3 .  warfarin (COUMADIN) 5 MG tablet, TAKE 1 TO 1&1/2 TABLET DAILY AS DIRECTED, Disp: 135 tablet, Rfl: 0 .  pravastatin (PRAVACHOL) 80 MG tablet, Take 1 tablet (80 mg total) by mouth every evening., Disp: 90 tablet, Rfl: 3 Social History   Socioeconomic History  . Marital status: Widowed    Spouse name: Not on file  . Number of children: 4  . Years of education: 6  . Highest education level: 6th grade  Social Needs  . Financial resource strain: Not hard at all  . Food insecurity - worry: Never true  . Food insecurity - inability: Never true  . Transportation needs - medical: No  . Transportation needs - non-medical: No  Occupational History  . Occupation: Retired    Comment: Charity fundraiser  Tobacco Use  . Smoking status: Former Smoker    Packs/day: 0.50    Years: 5.00    Pack years: 2.50    Types: Cigarettes    Last attempt to quit: 03/13/1981    Years since quitting: 36.2  . Smokeless tobacco: Never Used  . Tobacco comment: quit 30 +years ago  Substance and Sexual Activity  . Alcohol use: Yes    Alcohol/week: 0.6 oz    Types: 1 Glasses of wine per week  . Drug use: No  . Sexual activity: Yes    Birth control/protection: Post-menopausal  Other Topics Concern  . Not on file  Social History Narrative   Widowed but is remarrying in July 2019. Has 4 adult children. Lives in a house with fiance.    Family History  Problem Relation Age of Onset  . Heart attack Mother   . Hypertension Mother   . Stroke Mother        Deceased, age 55  . Diabetes Sister   .  Diabetes Sister   . Cancer Sister        ovarian  . Stroke Sister   . Diabetes Brother        also has a blood disorder   . Clotting disorder Brother   . Kidney disease Brother   . Heart disease Brother   . Melanoma Brother        deceased  . Coronary artery disease Other   . Kidney disease Other   . Diabetes Son   . Hypertension Son   . Colon  cancer Neg Hx     Objective: Office vital signs reviewed. BP 131/61   Pulse 60   Temp (!) 96.8 F (36 C) (Oral)   Ht 4\' 11"  (1.499 m)   Wt 189 lb (85.7 kg)   BMI 38.17 kg/m   Physical Examination:  General: Awake, alert, well nourished, No acute distress HEENT: Normal, MMM Cardio: Irregularly irregular, S1S2 heard, no murmurs appreciated Pulm: clear to auscultation bilaterally, no wheezes, rhonchi or rales; normal work of breathing on room air Extremities: warm, well perfused, No edema, cyanosis or clubbing; +1 pedal pulses bilaterally MSK: normal gait and normal station  Right foot: No gross swelling, visible deformities noted.  She does have mild pain over the medial aspect of the midfoot.  There is mild hyperpigmentation over this area as well.  She has full active range of motion of the foot.  Pedal pulses as above. Skin: dry; intact; no rashes; mild hyperpigmentation as above. Neuro: LE light touch sensation grossly intact  Orthostatic VS for the past 24 hrs:  BP- Lying Pulse- Lying BP- Sitting Pulse- Sitting BP- Standing at 0 minutes Pulse- Standing at 0 minutes  06/07/17 1610 124/70 60 131/61 60 122/70 64   Dg Foot Complete Right  Result Date: 06/07/2017 CLINICAL DATA:  Right foot pain.  No known injury. EXAM: RIGHT FOOT COMPLETE - 3+ VIEW COMPARISON:  None. FINDINGS: No fracture or bone lesion. Joints are normally aligned. 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 Small dorsal plantar calcaneal spurs. Soft tissues are unremarkable. IMPRESSION: 1. No fracture or acute finding. 2. Calcaneal spurs.  Minor first metatarsophalangeal joint osteoarthritis. Electronically Signed   By: Lajean Manes M.D.   On: 06/07/2017 16:34    Assessment/ Plan: 77 y.o. female   1. Right foot pain No gross deformities appreciated on exam but she did have mild hyperpigmentation over the medial aspect of the midfoot.  Given her history of controlled but long-standing type 2 diabetes, I did obtain an x-ray to rule out Charcot joint.  It appears that she has a minor first metatarsal phalangeal OA change.  This may explain foot pain.  I recommended that she use Tylenol and ice.  If she worsens, could consider referral to orthopedics for possible joint injection. - DG Foot Complete Right; Future  2. Orthostatic dizziness I suspect she is having dizziness secondary to blood pressure changes.  Her orthostatic vital signs were technically within normal limits but she was symptomatic during position changes.  I recommended compression hose and a prescription was provided to her today.  I also recommended that she change positions slowly so that she allows her body to compensate.  If symptoms worsen, she will follow-up for reevaluation.  3. Shortness of breath on exertion Possibly related to generalized deconditioning.  However, she does have a significant cardiac history.  I reviewed her last echocardiogram from 2017 which did not demonstrate any systolic dysfunction.  She does have grade 2 diastolic dysfunction with an ejection fraction of 60-65%.  She feels that albuterol does help with this in the past.  I have refilled this but did highly encourage that she follow-up with her primary care doctor to monitor this closely.  She will also schedule appointment with her cardiologist to consider possibly obtaining a new echocardiogram if symptoms are not improved by albuterol.  Strict return precautions and reasons for emergent evaluation emergency department were reviewed with patient.  She was good understanding will follow-up  as needed.   Orders Placed This  Encounter  Procedures  . DG Foot Complete Right    Standing Status:   Future    Number of Occurrences:   1    Standing Expiration Date:   08/08/2018    Order Specific Question:   Reason for Exam (SYMPTOM  OR DIAGNOSIS REQUIRED)    Answer:   1 week history of right midfoot pain in a T2DM.  r/o charcot    Order Specific Question:   Preferred imaging location?    Answer:   Internal    Order Specific Question:   Radiology Contrast Protocol - do NOT remove file path    Answer:   [email or web address]   Meds ordered this encounter  Medications  . albuterol (PROVENTIL HFA;VENTOLIN HFA) 108 (90 Base) MCG/ACT inhaler    Sig: Inhale 2 puffs into the lungs every 6 (six) hours as needed for wheezing or shortness of breath.    Dispense:  1 Inhaler    Refill:  [facility], [place] [phone]

## 2017-06-07 NOTE — Patient Instructions (Signed)
You are likely having something called orthostatic hypotension that is causing your dizziness.  This often occurs when your body is not able to compensate quickly enough and your blood pressure drops a bit.  I have prescribed you compression hose to use.  I also would like you to make sure that you are changing positions very slowly.  If symptoms worsen, please seek immediate medical attention.  I will contact you the results of your foot x-ray.  Please make sure that you schedule an appointment with your cardiologist for further evaluation of your shortness of breath with activity.   Hypotension As your heart beats, it forces blood through your body. This force is called blood pressure. If you have hypotension, you have low blood pressure. When your blood pressure is too low, you may not get enough blood to your brain. You may feel weak, feel light-headed, have a fast heartbeat, or even pass out (faint). Follow these instructions at home: Eating and drinking  Drink enough fluids to keep your pee (urine) clear or pale yellow.  Eat a healthy diet, and follow instructions from your doctor about eating or drinking restrictions. A healthy diet includes: ? Fresh fruits and vegetables. ? Whole grains. ? Low-fat (lean) meats. ? Low-fat dairy products.  Eat extra salt only as told. Do not add extra salt to your diet unless your doctor tells you to.  Eat small meals often.  Avoid standing up quickly after you eat. Medicines  Take over-the-counter and prescription medicines only as told by your doctor. ? Follow instructions from your doctor about changing how much you take (the dosage) of your medicines, if this applies. ? Do not stop or change your medicine on your own. General instructions  Wear compression stockings as told by your doctor.  Get up slowly from lying down or sitting.  Avoid hot showers and a lot of heat as told by your doctor.  Return to your normal activities as told by  your doctor. Ask what activities are safe for you.  Do not use any products that contain nicotine or tobacco, such as cigarettes and e-cigarettes. If you need help quitting, ask your doctor.  Keep all follow-up visits as told by your doctor. This is important. Contact a doctor if:  You throw up (vomit).  You have watery poop (diarrhea).  You have a fever for more than 2-3 days.  You feel more thirsty than normal.  You feel weak and tired. Get help right away if:  You have chest pain.  You have a fast or irregular heartbeat.  You lose feeling (get numbness) in any part of your body.  You cannot move your arms or your legs.  You have trouble talking.  You get sweaty or feel light-headed.  You faint.  You have trouble breathing.  You have trouble staying awake.  You feel confused. This information is not intended to replace advice given to you by your health care provider. Make sure you discuss any questions you have with your health care provider. Document Released: 06/20/2009 Document Revised: 12/13/2015 Document Reviewed: 12/13/2015 Elsevier Interactive Patient Education  2017 [employer].

## 2017-06-18 ENCOUNTER — Ambulatory Visit: Payer: Medicare Other | Admitting: Family

## 2017-06-18 ENCOUNTER — Encounter: Payer: Self-pay | Admitting: Family

## 2017-06-18 VITALS — BP 119/61 | HR 64 | Temp 98.7°F | Ht 59.0 in | Wt 189.0 lb

## 2017-06-18 DIAGNOSIS — Z7901 Long term (current) use of anticoagulants: Secondary | ICD-10-CM | POA: Diagnosis not present

## 2017-06-18 DIAGNOSIS — J019 Acute sinusitis, unspecified: Secondary | ICD-10-CM

## 2017-06-18 DIAGNOSIS — I480 Paroxysmal atrial fibrillation: Secondary | ICD-10-CM

## 2017-06-18 DIAGNOSIS — R6889 Other general symptoms and signs: Secondary | ICD-10-CM

## 2017-06-18 LAB — VERITOR FLU A/B WAIVED
Influenza A: NEGATIVE
Influenza B: NEGATIVE

## 2017-06-18 LAB — COAGUCHEK XS/INR WAIVED
INR: 1.8 — ABNORMAL HIGH (ref 0.9–1.1)
Prothrombin Time: 21.3 s

## 2017-06-18 MED ORDER — AMOXICILLIN-POT CLAVULANATE 875-125 MG PO TABS: 1.0000 | ORAL_TABLET | Freq: Two times a day (BID) | ORAL | 0 refills | Status: DC

## 2017-06-18 NOTE — Progress Notes (Signed)
   Subjective:    Patient ID: Rhonda Garcia, female    DOB: 08-Nov-1940, 77 y.o.   MRN: [id number]  Sinus Problem  This is a new problem. The current episode started in the past 7 days. The problem is unchanged. There has been no fever. Associated symptoms include chills, coughing, headaches, a hoarse voice, sinus pressure, sneezing and a sore throat. Pertinent negatives include no congestion or ear pain. Past treatments include oral decongestants. The treatment provided mild relief.  A Fib PT currently taking warfarin. See anticoagulation flowsheet.     Review of Systems  Constitutional: Positive for chills.  HENT: Positive for hoarse voice, sinus pressure, sneezing and sore throat. Negative for congestion and ear pain.   Respiratory: Positive for cough.   Neurological: Positive for headaches.  All other systems reviewed and are negative.      Objective:   Physical Exam  Constitutional: She is oriented to person, place, and time. She appears well-developed and well-nourished. No distress.  HENT:  Head: Normocephalic and atraumatic.  Right Ear: External ear normal.  Left Ear: External ear normal.  Nose: Mucosal edema and rhinorrhea present.  Mouth/Throat: Posterior oropharyngeal erythema present.  Eyes: Pupils are equal, round, and reactive to light.  Neck: Normal range of motion. Neck supple. No thyromegaly present.  Cardiovascular: Normal rate, regular rhythm, normal heart sounds and intact distal pulses.  No murmur heard. Pulmonary/Chest: Effort normal and breath sounds normal. No respiratory distress. She has no wheezes.  Abdominal: Soft. Bowel sounds are normal. She exhibits no distension. There is no tenderness.  Musculoskeletal: Normal range of motion. She exhibits no edema or tenderness.  Neurological: She is alert and oriented to person, place, and time.  Skin: Skin is warm and dry.  Psychiatric: She has a normal mood and affect. Her behavior is normal. Judgment and  thought content normal.  Vitals reviewed.     BP 119/61   Pulse 64   Temp 98.7 F (37.1 C) (Oral)   Ht 4\' 11"  (1.499 m)   Wt 189 lb (85.7 kg)   BMI 38.17 kg/m      Assessment & Plan:  1. Flu-like symptoms - Veritor Flu A/B Waived  2. Paroxysmal atrial fibrillation (HCC) - CoaguChek XS/INR Waived  3. Long term current use of anticoagulant therapy - CoaguChek XS/INR Waived  4. Acute sinusitis, recurrence not specified, unspecified location - Take meds as prescribed - Use a cool mist humidifier  -Use saline nose sprays frequently -Force fluids -For any cough or congestion  Use plain Mucinex- regular strength or max strength is fine -For fever or aces or pains- take tylenol or ibuprofen. -Throat lozenges if help -New toothbrush in 3 days - amoxicillin-clavulanate (AUGMENTIN) 875-125 MG tablet; Take 1 tablet by mouth 2 (two) times daily.  Dispense: 14 tablet; Refill: 0    Evelina Dun, FNP

## 2017-06-18 NOTE — Patient Instructions (Signed)

## 2017-06-21 ENCOUNTER — Ambulatory Visit: Payer: Medicare Other | Admitting: Pharmacist Clinician (PhC)/ Clinical Pharmacy Specialist

## 2017-06-21 ENCOUNTER — Encounter: Payer: Medicare Other | Admitting: Pharmacist Clinician (PhC)/ Clinical Pharmacy Specialist

## 2017-07-11 DIAGNOSIS — S92321A Displaced fracture of second metatarsal bone, right foot, initial encounter for closed fracture: Secondary | ICD-10-CM | POA: Diagnosis not present

## 2017-07-11 DIAGNOSIS — M79671 Pain in right foot: Secondary | ICD-10-CM | POA: Diagnosis not present

## 2017-07-11 DIAGNOSIS — M79674 Pain in right toe(s): Secondary | ICD-10-CM | POA: Diagnosis not present

## 2017-08-12 ENCOUNTER — Other Ambulatory Visit: Payer: Self-pay | Admitting: Family

## 2017-08-12 DIAGNOSIS — Z7901 Long term (current) use of anticoagulants: Secondary | ICD-10-CM

## 2017-08-13 ENCOUNTER — Telehealth: Payer: Self-pay | Admitting: *Deleted

## 2017-08-13 ENCOUNTER — Encounter: Payer: Self-pay | Admitting: Internal Medicine

## 2017-08-13 NOTE — Telephone Encounter (Signed)
PATIENT SCHEDULED AND LETTER SENT  °

## 2017-08-13 NOTE — Telephone Encounter (Signed)
Called the drug store and made aware. Refill denied.

## 2017-08-13 NOTE — Telephone Encounter (Signed)
Received refill request for  linzess 172mcg from [facility]. 1 capsule once a day.

## 2017-08-13 NOTE — Telephone Encounter (Signed)
Patient has not been seen since May 2018. It looks like she was on 72 mcg daily at that time. I don't see how when we increased this? Need a little more info and need to have her return for routine follow-up.

## 2017-08-14 DIAGNOSIS — M79671 Pain in right foot: Secondary | ICD-10-CM | POA: Diagnosis not present

## 2017-08-14 DIAGNOSIS — M779 Enthesopathy, unspecified: Secondary | ICD-10-CM | POA: Diagnosis not present

## 2017-08-20 ENCOUNTER — Ambulatory Visit (INDEPENDENT_AMBULATORY_CARE_PROVIDER_SITE_OTHER): Payer: Medicare Other | Admitting: Family

## 2017-08-20 ENCOUNTER — Encounter: Payer: Self-pay | Admitting: Family

## 2017-08-20 VITALS — BP 144/74 | HR 55 | Temp 97.4°F | Ht 59.0 in | Wt 194.6 lb

## 2017-08-20 DIAGNOSIS — E785 Hyperlipidemia, unspecified: Secondary | ICD-10-CM

## 2017-08-20 DIAGNOSIS — Z7901 Long term (current) use of anticoagulants: Secondary | ICD-10-CM

## 2017-08-20 DIAGNOSIS — I25110 Atherosclerotic heart disease of native coronary artery with unstable angina pectoris: Secondary | ICD-10-CM

## 2017-08-20 DIAGNOSIS — I480 Paroxysmal atrial fibrillation: Secondary | ICD-10-CM

## 2017-08-20 DIAGNOSIS — E1169 Type 2 diabetes mellitus with other specified complication: Secondary | ICD-10-CM | POA: Diagnosis not present

## 2017-08-20 DIAGNOSIS — E1165 Type 2 diabetes mellitus with hyperglycemia: Secondary | ICD-10-CM | POA: Diagnosis not present

## 2017-08-20 DIAGNOSIS — F331 Major depressive disorder, recurrent, moderate: Secondary | ICD-10-CM

## 2017-08-20 DIAGNOSIS — I10 Essential (primary) hypertension: Secondary | ICD-10-CM | POA: Diagnosis not present

## 2017-08-20 DIAGNOSIS — E039 Hypothyroidism, unspecified: Secondary | ICD-10-CM

## 2017-08-20 DIAGNOSIS — E1159 Type 2 diabetes mellitus with other circulatory complications: Secondary | ICD-10-CM

## 2017-08-20 DIAGNOSIS — I152 Hypertension secondary to endocrine disorders: Secondary | ICD-10-CM

## 2017-08-20 LAB — COAGUCHEK XS/INR WAIVED
INR: 4.6 — ABNORMAL HIGH (ref 0.9–1.1)
Prothrombin Time: 55.7 s

## 2017-08-20 LAB — BAYER DCA HB A1C WAIVED: HB A1C (BAYER DCA - WAIVED): 6.7 % (ref <7.0)

## 2017-08-20 NOTE — Progress Notes (Signed)
Subjective:    Patient ID: Rhonda Garcia, female    DOB: [birth year], 77 y.o.   MRN: [id number]  Chief Complaint  Patient presents with  . Diabetes    three month recheck   Pt presents to the office today for chronic follow up. PT is followed by Cardiologists annually for MIand A Fib.  Diabetes  She presents for her follow-up diabetic visit. She has type 2 diabetes mellitus. Her disease course has been stable. Hypoglycemia symptoms include nervousness/anxiousness. Associated symptoms include fatigue and foot paresthesias. Pertinent negatives for diabetes include no blurred vision, no chest pain and no visual change. There are no hypoglycemic complications. Symptoms are stable. Diabetic complications include heart disease and nephropathy. Pertinent negatives for diabetic complications include no CVA. Risk factors for coronary artery disease include diabetes mellitus, dyslipidemia, obesity and hypertension. She is following a generally unhealthy diet. (Does not check BS at home ) Eye exam is not current.  Hypertension  This is a chronic problem. The current episode started more than 1 year ago. The problem has been waxing and waning since onset. The problem is uncontrolled. Associated symptoms include anxiety. Pertinent negatives include no blurred vision, chest pain, malaise/fatigue, peripheral edema or shortness of breath. Risk factors for coronary artery disease include diabetes mellitus, dyslipidemia, obesity and family history. The current treatment provides mild improvement. There is no history of CVA. Identifiable causes of hypertension include a thyroid problem.  Anxiety  Presents for follow-up visit. Symptoms include excessive worry, irritability and nervous/anxious behavior. Patient reports no chest pain, depressed mood or shortness of breath. Symptoms occur most days. The severity of symptoms is moderate.    Hyperlipidemia  This is a chronic problem. The current episode started more  than 1 year ago. The problem is controlled. Recent lipid tests were reviewed and are normal. Exacerbating diseases include obesity. Pertinent negatives include no chest pain or shortness of breath. Current antihyperlipidemic treatment includes statins. The current treatment provides moderate improvement of lipids.  Thyroid Problem  Presents for follow-up visit. Symptoms include anxiety, constipation and fatigue. Patient reports no depressed mood, diaphoresis or visual change. The symptoms have been stable. Her past medical history is significant for hyperlipidemia.  A FIB PT taking warfarin. See anticoagulation flow sheet.    Review of Systems  Constitutional: Positive for fatigue and irritability. Negative for diaphoresis and malaise/fatigue.  Eyes: Negative for blurred vision.  Respiratory: Negative for shortness of breath.   Cardiovascular: Negative for chest pain.  Gastrointestinal: Positive for constipation.  Psychiatric/Behavioral: The patient is nervous/anxious.   All other systems reviewed and are negative.      Objective:   Physical Exam  Constitutional: She is oriented to person, place, and time. She appears well-developed and well-nourished. No distress.  HENT:  Head: Normocephalic and atraumatic.  Right Ear: External ear normal.  Left Ear: External ear normal.  Mouth/Throat: Oropharynx is clear and moist.  Eyes: Pupils are equal, round, and reactive to light.  Neck: Normal range of motion. Neck supple. No thyromegaly present.  Cardiovascular: Normal rate, regular rhythm, normal heart sounds and intact distal pulses.  No murmur heard. Pulmonary/Chest: Effort normal and breath sounds normal. No respiratory distress. She has no wheezes.  Abdominal: Soft. Bowel sounds are normal. She exhibits no distension. There is no tenderness.  Musculoskeletal: Normal range of motion. She exhibits no edema or tenderness.  Neurological: She is alert and oriented to person, place, and time.  She has normal reflexes. No cranial nerve deficit.  Skin:  Skin is warm and dry.  Psychiatric: She has a normal mood and affect. Her behavior is normal. Judgment and thought content normal.  Vitals reviewed.  Diabetic Foot Exam - Simple   Simple Foot Form Diabetic Foot exam was performed with the following findings:  Yes 08/20/2017  4:38 PM  Visual Inspection No deformities, no ulcerations, no other skin breakdown bilaterally:  Yes Sensation Testing Intact to touch and monofilament testing bilaterally:  Yes Pulse Check Posterior Tibialis and Dorsalis pulse intact bilaterally:  Yes Comments      BP (!) 142/80   Pulse (!) 56   Temp (!) 97.4 F (36.3 C) (Oral)   Ht 4' 11"  (1.499 m)   Wt 194 lb 9.6 oz (88.3 kg)   BMI 39.30 kg/m       Assessment & Plan:  Rhonda Garcia comes in today with chief complaint of Diabetes (three month recheck)   Diagnosis and orders addressed:  1. Hypertension associated with diabetes ([place]) - CMP14+EGFR  2. Paroxysmal atrial fibrillation (HCC) - CMP14+EGFR - CoaguChek XS/INR Waived  3. Coronary artery disease involving native coronary artery of native heart with unstable angina pectoris (HCC) - CMP14+EGFR  4. Hyperlipidemia associated with type 2 diabetes mellitus (HCC) - CMP14+EGFR - Lipid panel  5. Type 2 diabetes mellitus with hyperglycemia, without long-term current use of insulin (HCC) - Bayer DCA Hb A1c Waived - CMP14+EGFR  6. Hypothyroidism, unspecified type - CMP14+EGFR - TSH  7. Long term current use of anticoagulant therapy Description   INR-4.6,   Take 1 1/2 (7.5 mg) tables tablet  Monday, and Friday.  Take 1 tablets (74m) all other days.   INR today 08/20/17 is 4.6, Hold today's dose        - CMP14+EGFR - CoaguChek XS/INR Waived  8. Moderate episode of recurrent major depressive disorder (HCC) - CMP14+EGFR  [place]. [place] CMP14[place]EGFR   Labs pending Health Maintenance reviewed Diet and exercise  encouraged  Follow up plan: 3 months and 1 month with MSharyn Lullfor INR    CEvelina Dun FNP

## 2017-08-20 NOTE — Patient Instructions (Signed)

## 2017-08-21 LAB — LIPID PANEL
Chol/HDL Ratio: 3.3 ratio (ref 0.0–4.4)
Cholesterol, Total: 144 mg/dL (ref 100–199)
HDL: 43 mg/dL (ref >39)
LDL Calculated: 82 mg/dL (ref 0–99)
Triglycerides: 96 mg/dL (ref 0–149)
VLDL Cholesterol Cal: 19 mg/dL (ref 5–40)

## 2017-08-21 LAB — CMP14+EGFR
ALT: 16 IU/L (ref 0–32)
AST: 17 IU/L (ref 0–40)
Albumin/Globulin Ratio: 2.2 (ref 1.2–2.2)
Albumin: 4.1 g/dL (ref 3.5–4.8)
Alkaline Phosphatase: 74 IU/L (ref 39–117)
BUN/Creatinine Ratio: 15 (ref 12–28)
BUN: 14 mg/dL (ref 8–27)
Bilirubin Total: 0.6 mg/dL (ref 0.0–1.2)
CO2: 25 mmol/L (ref 20–29)
Calcium: 10 mg/dL (ref 8.7–10.3)
Chloride: 104 mmol/L (ref 96–106)
Creatinine, Ser: 0.94 mg/dL (ref 0.57–1.00)
GFR calc Af Amer: 68 mL/min/{1.73_m2} (ref >59)
GFR calc non Af Amer: 59 mL/min/{1.73_m2} — ABNORMAL LOW (ref >59)
Globulin, Total: 1.9 g/dL (ref 1.5–4.5)
Glucose: 145 mg/dL — ABNORMAL HIGH (ref 65–99)
Potassium: 4.6 mmol/L (ref 3.5–5.2)
Sodium: 142 mmol/L (ref 134–144)
Total Protein: 6 g/dL (ref 6.0–8.5)

## 2017-08-21 LAB — TSH: TSH: 1.57 u[IU]/mL (ref 0.450–4.500)

## 2017-09-04 DIAGNOSIS — M79671 Pain in right foot: Secondary | ICD-10-CM | POA: Diagnosis not present

## 2017-09-04 DIAGNOSIS — M779 Enthesopathy, unspecified: Secondary | ICD-10-CM | POA: Diagnosis not present

## 2017-09-05 ENCOUNTER — Telehealth: Payer: Self-pay | Admitting: Family

## 2017-09-05 MED ORDER — LINACLOTIDE 72 MCG PO CAPS: 72.0000 ug | ORAL_CAPSULE | Freq: Every day | ORAL | 3 refills | Status: DC

## 2017-09-05 NOTE — Telephone Encounter (Signed)
pt aware 

## 2017-09-05 NOTE — Telephone Encounter (Signed)
What is the name of the medication? linaclotide (LINZESS) 72 MCG capsule  Have you contacted your pharmacy to request a refill? no  Which pharmacy would you like this sent to? [facility]   Patient notified that their request is being sent to the clinical staff for review and that they should receive a call once it is complete. If they do not receive a call within 24 hours they can check with their pharmacy or our office.

## 2017-09-20 ENCOUNTER — Encounter: Payer: Medicare Other | Admitting: Pharmacist Clinician (PhC)/ Clinical Pharmacy Specialist

## 2017-10-11 ENCOUNTER — Ambulatory Visit (INDEPENDENT_AMBULATORY_CARE_PROVIDER_SITE_OTHER): Payer: Medicare Other | Admitting: Family

## 2017-10-11 ENCOUNTER — Other Ambulatory Visit: Payer: Self-pay | Admitting: Family

## 2017-10-11 DIAGNOSIS — I480 Paroxysmal atrial fibrillation: Secondary | ICD-10-CM

## 2017-10-11 DIAGNOSIS — Z7901 Long term (current) use of anticoagulants: Secondary | ICD-10-CM | POA: Diagnosis not present

## 2017-10-11 DIAGNOSIS — K219 Gastro-esophageal reflux disease without esophagitis: Secondary | ICD-10-CM

## 2017-10-11 LAB — COAGUCHEK XS/INR WAIVED
INR: 1.9 — ABNORMAL HIGH (ref 0.9–1.1)
Prothrombin Time: 22.7 s

## 2017-10-11 NOTE — Addendum Note (Signed)
Addended by: Wardell Heath on: 10/11/2017 02:03 PM   Modules accepted: Orders

## 2017-10-11 NOTE — Patient Instructions (Signed)
Food Choices for Gastroesophageal Reflux Disease, Adult When you have gastroesophageal reflux disease (GERD), the foods you eat and your eating habits are very important. Choosing the right foods can help ease your discomfort. What guidelines do I need to follow?  Choose fruits, vegetables, whole grains, and low-fat dairy products.  Choose low-fat meat, fish, and poultry.  Limit fats such as oils, salad dressings, butter, nuts, and avocado.  Keep a food diary. This helps you identify foods that cause symptoms.  Avoid foods that cause symptoms. These may be different for everyone.  Eat small meals often instead of 3 large meals a day.  Eat your meals slowly, in a place where you are relaxed.  Limit fried foods.  Cook foods using methods other than frying.  Avoid drinking alcohol.  Avoid drinking large amounts of liquids with your meals.  Avoid bending over or lying down until 2-3 hours after eating. What foods are not recommended? These are some foods and drinks that may make your symptoms worse: Vegetables  Tomatoes. Tomato juice. Tomato and spaghetti sauce. Chili peppers. Onion and garlic. Horseradish. Fruits  Oranges, grapefruit, and lemon (fruit and juice). Meats  High-fat meats, fish, and poultry. This includes hot dogs, ribs, ham, sausage, salami, and bacon. Dairy  Whole milk and chocolate milk. Sour cream. Cream. Butter. Ice cream. Cream cheese. Drinks  Coffee and tea. Bubbly (carbonated) drinks or energy drinks. Condiments  Hot sauce. Barbecue sauce. Sweets/Desserts  Chocolate and cocoa. Donuts. Peppermint and spearmint. Fats and Oils  High-fat foods. This includes French fries and potato chips. Other  Vinegar. Strong spices. This includes black pepper, white pepper, red pepper, cayenne, curry powder, cloves, ginger, and chili powder. The items listed above may not be a complete list of foods and drinks to avoid. Contact your dietitian for more information.    This information is not intended to replace advice given to you by your health care provider. Make sure you discuss any questions you have with your health care provider. Document Released: 09/25/2011 Document Revised: 09/01/2015 Document Reviewed: 01/28/2013 Elsevier Interactive Patient Education  2017 [employer]  

## 2017-11-04 DIAGNOSIS — Z1231 Encounter for screening mammogram for malignant neoplasm of breast: Secondary | ICD-10-CM | POA: Diagnosis not present

## 2017-11-04 LAB — HM MAMMOGRAPHY

## 2017-11-06 ENCOUNTER — Telehealth: Payer: Self-pay

## 2017-11-06 NOTE — Telephone Encounter (Signed)
Pt states she does take the Pravastatin.

## 2017-11-06 NOTE — Telephone Encounter (Signed)
Can we call and see if patient is taking pravastatin? On med list

## 2017-11-06 NOTE — Telephone Encounter (Signed)
Mickel Baas from [facility] wanted to know why patient is not on a statin since she is diabetic. Per [facility] patient can not tolerate stain. Mickel Baas is wanting to know if patient has been on two different low dose stain? That is the recommendation. Please advise

## 2017-11-07 ENCOUNTER — Other Ambulatory Visit: Payer: Self-pay | Admitting: Family

## 2017-11-08 ENCOUNTER — Ambulatory Visit: Payer: Medicare Other | Admitting: Gastroenterology

## 2017-11-13 ENCOUNTER — Other Ambulatory Visit: Payer: Self-pay | Admitting: Family

## 2017-11-13 DIAGNOSIS — K219 Gastro-esophageal reflux disease without esophagitis: Secondary | ICD-10-CM

## 2017-11-22 ENCOUNTER — Ambulatory Visit: Payer: Medicare Other | Admitting: Family

## 2017-11-22 ENCOUNTER — Encounter: Payer: Self-pay | Admitting: Family

## 2017-11-22 VITALS — BP 130/67 | HR 62 | Temp 97.6°F | Ht 59.0 in | Wt 189.4 lb

## 2017-11-22 DIAGNOSIS — F331 Major depressive disorder, recurrent, moderate: Secondary | ICD-10-CM

## 2017-11-22 DIAGNOSIS — R400 Somnolence: Secondary | ICD-10-CM

## 2017-11-22 DIAGNOSIS — I152 Hypertension secondary to endocrine disorders: Secondary | ICD-10-CM

## 2017-11-22 DIAGNOSIS — E1165 Type 2 diabetes mellitus with hyperglycemia: Secondary | ICD-10-CM | POA: Diagnosis not present

## 2017-11-22 DIAGNOSIS — M1990 Unspecified osteoarthritis, unspecified site: Secondary | ICD-10-CM

## 2017-11-22 DIAGNOSIS — I480 Paroxysmal atrial fibrillation: Secondary | ICD-10-CM

## 2017-11-22 DIAGNOSIS — F411 Generalized anxiety disorder: Secondary | ICD-10-CM | POA: Diagnosis not present

## 2017-11-22 DIAGNOSIS — R350 Frequency of micturition: Secondary | ICD-10-CM

## 2017-11-22 DIAGNOSIS — R0683 Snoring: Secondary | ICD-10-CM

## 2017-11-22 DIAGNOSIS — Z7901 Long term (current) use of anticoagulants: Secondary | ICD-10-CM

## 2017-11-22 DIAGNOSIS — E785 Hyperlipidemia, unspecified: Secondary | ICD-10-CM

## 2017-11-22 DIAGNOSIS — R791 Abnormal coagulation profile: Secondary | ICD-10-CM | POA: Diagnosis not present

## 2017-11-22 DIAGNOSIS — E039 Hypothyroidism, unspecified: Secondary | ICD-10-CM

## 2017-11-22 DIAGNOSIS — E1169 Type 2 diabetes mellitus with other specified complication: Secondary | ICD-10-CM | POA: Diagnosis not present

## 2017-11-22 DIAGNOSIS — N3941 Urge incontinence: Secondary | ICD-10-CM

## 2017-11-22 DIAGNOSIS — E559 Vitamin D deficiency, unspecified: Secondary | ICD-10-CM

## 2017-11-22 DIAGNOSIS — I25110 Atherosclerotic heart disease of native coronary artery with unstable angina pectoris: Secondary | ICD-10-CM | POA: Diagnosis not present

## 2017-11-22 DIAGNOSIS — I10 Essential (primary) hypertension: Secondary | ICD-10-CM

## 2017-11-22 DIAGNOSIS — E1159 Type 2 diabetes mellitus with other circulatory complications: Secondary | ICD-10-CM

## 2017-11-22 LAB — URINALYSIS, COMPLETE
Bilirubin, UA: NEGATIVE
Glucose, UA: NEGATIVE
Nitrite, UA: NEGATIVE
Protein, UA: NEGATIVE
Specific Gravity, UA: 1.03 (ref 1.005–1.030)
Urobilinogen, Ur: 0.2 mg/dL (ref 0.2–1.0)
pH, UA: 5 (ref 5.0–7.5)

## 2017-11-22 LAB — MICROSCOPIC EXAMINATION

## 2017-11-22 LAB — COAGUCHEK XS/INR WAIVED
INR: 5.2 (ref 0.9–1.1)
Prothrombin Time: 62.4 s

## 2017-11-22 LAB — BAYER DCA HB A1C WAIVED: HB A1C (BAYER DCA - WAIVED): 6.8 % (ref <7.0)

## 2017-11-22 MED ORDER — PHYTONADIONE 5 MG PO TABS
2.5000 mg | ORAL_TABLET | Freq: Once | ORAL | Status: AC
  Administered 2017-11-22: 2.5 mg via ORAL

## 2017-11-22 NOTE — Progress Notes (Signed)
Subjective:    Patient ID: Rhonda Garcia, female    DOB: 09/15/1940, 77 y.o.   MRN: [id number]  No chief complaint on file.  Pt presents to the office today for chronic follow up. PT is followed by Cardiologists annually for MIand A Fib. Diabetes  She presents for her follow-up diabetic visit. She has type 2 diabetes mellitus. Her disease course has been stable. Hypoglycemia symptoms include nervousness/anxiousness. Associated symptoms include fatigue. Pertinent negatives for diabetes include no blurred vision, no foot paresthesias and no visual change. There are no hypoglycemic complications. Symptoms are stable. Diabetic complications include heart disease and peripheral neuropathy. Pertinent negatives for diabetic complications include no CVA. Risk factors for coronary artery disease include diabetes mellitus, dyslipidemia, hypertension and sedentary lifestyle. (Does not check BS at home )  Hypertension  This is a chronic problem. The current episode started more than 1 year ago. The problem has been resolved since onset. The problem is controlled. Associated symptoms include anxiety. Pertinent negatives include no blurred vision, malaise/fatigue, peripheral edema or shortness of breath. Risk factors for coronary artery disease include dyslipidemia, diabetes mellitus, obesity and sedentary lifestyle. The current treatment provides moderate improvement. Hypertensive end-organ damage includes CAD/MI. There is no history of kidney disease or CVA. Identifiable causes of hypertension include a thyroid problem.  Hyperlipidemia  This is a chronic problem. The current episode started more than 1 year ago. The problem is controlled. Recent lipid tests were reviewed and are normal. Exacerbating diseases include obesity. Pertinent negatives include no shortness of breath. Current antihyperlipidemic treatment includes statins. The current treatment provides moderate improvement of lipids. Risk factors for  coronary artery disease include dyslipidemia, diabetes mellitus, hypertension, a sedentary lifestyle and post-menopausal.  Arthritis  Presents for follow-up visit. She complains of pain and stiffness. Affected locations include the right knee, left knee, right MCP, left MCP, right hip and left hip. Her pain is at a severity of 7/10. Associated symptoms include fatigue. Pertinent negatives include no diarrhea.  Thyroid Problem  Presents for follow-up visit. Symptoms include anxiety and fatigue. Patient reports no constipation, depressed mood, diarrhea or visual change. The symptoms have been stable. Her past medical history is significant for hyperlipidemia.  Anxiety  Presents for follow-up visit. Symptoms include excessive worry and nervous/anxious behavior. Patient reports no depressed mood, irritability, nausea or shortness of breath. Symptoms occur occasionally. The severity of symptoms is moderate.    Depression         This is a chronic problem.  The current episode started more than 1 year ago.   The onset quality is gradual.   The problem occurs intermittently.  The problem has been waxing and waning since onset.  Associated symptoms include fatigue.  Past treatments include SSRIs - Selective serotonin reuptake inhibitors.  Compliance with treatment is good.  Past medical history includes thyroid problem and anxiety.   Urinary Frequency   This is a recurrent problem. The current episode started 1 to 4 weeks ago. The problem occurs every urination. The problem has been waxing and waning. The patient is experiencing no pain. Associated symptoms include frequency and urgency. Pertinent negatives include no discharge, flank pain, hematuria, nausea or vomiting. The treatment provided no relief.      Review of Systems  Constitutional: Positive for fatigue. Negative for irritability and malaise/fatigue.  Eyes: Negative for blurred vision.  Respiratory: Negative for shortness of breath.     Gastrointestinal: Negative for constipation, diarrhea, nausea and vomiting.  Genitourinary: Positive for frequency  and urgency. Negative for flank pain and hematuria.  Musculoskeletal: Positive for arthritis and stiffness.  Psychiatric/Behavioral: Positive for depression. The patient is nervous/anxious.   All other systems reviewed and are negative.      Objective:   Physical Exam  Constitutional: She is oriented to person, place, and time. She appears well-developed and well-nourished. No distress.  Morbid obese   HENT:  Head: Normocephalic and atraumatic.  Right Ear: External ear normal.  Left Ear: External ear normal.  Mouth/Throat: Oropharynx is clear and moist.  Eyes: Pupils are equal, round, and reactive to light.  Neck: Normal range of motion. Neck supple. No thyromegaly present.  Cardiovascular: Normal rate, regular rhythm, normal heart sounds and intact distal pulses.  No murmur heard. Pulmonary/Chest: Effort normal and breath sounds normal. No respiratory distress. She has no wheezes.  Abdominal: Soft. Bowel sounds are normal. She exhibits no distension. There is no tenderness.  Musculoskeletal: Normal range of motion. She exhibits no edema or tenderness.  Neurological: She is alert and oriented to person, place, and time. She has normal reflexes. No cranial nerve deficit.  Skin: Skin is warm and dry.  Psychiatric: She has a normal mood and affect. Her behavior is normal. Judgment and thought content normal.  Vitals reviewed.     BP 130/67   Pulse 62   Temp 97.6 F (36.4 C) (Oral)   Ht 4' 11"  (1.499 m)   Wt 189 lb 6.4 oz (85.9 kg)   BMI 38.25 kg/m      Assessment & Plan:  Rhonda Garcia comes in today with chief complaint of No chief complaint on file.   Diagnosis and orders addressed:  1. Coronary artery disease involving native coronary artery of native heart with unstable angina pectoris (HCC) - CMP14+EGFR - CBC with Differential/Platelet  2.  Moderate episode of recurrent major depressive disorder (HCC) - CMP14+EGFR - CBC with Differential/Platelet  3. GAD (generalized anxiety disorder) - CMP14+EGFR - CBC with Differential/Platelet  4. Hyperlipidemia associated with type 2 diabetes mellitus (HCC) - CMP14+EGFR - CBC with Differential/Platelet  5. Hypertension associated with diabetes ([place]) - CMP14+EGFR - CBC with Differential/Platelet  6. Vitamin D deficiency - CMP14+EGFR - CBC with Differential/Platelet  7. Type 2 diabetes mellitus with hyperglycemia, without long-term current use of insulin (HCC) - Bayer DCA Hb A1c Waived - CMP14+EGFR - CBC with Differential/Platelet  8. Paroxysmal atrial fibrillation (HCC) Description   INR-5.2,  Hold warfarin,  Vit K given today,  Take 1 tablets (54m) all other days.   INR today 08/20/17 is 4.6, Hold today's dose       - CMP14+EGFR - CoaguChek XS/INR Waived - CBC with Differential/Platelet  9. Osteoarthritis, unspecified osteoarthritis type, unspecified site - CMP14+EGFR - CBC with Differential/Platelet  10. Morbid obesity ([place] - CMP14+EGFR - Ambulatory referral to Pulmonology - CBC with Differential/Platelet  11. Hypothyroidism, unspecified type - CMP14+EGFR - CBC with Differential/Platelet  12. Long term current use of anticoagulant therapy - CMP14+EGFR - CoaguChek XS/INR Waived - CBC with Differential/Platelet  13. Daytime sleepiness - Ambulatory referral to Pulmonology - CBC with Differential/Platelet  14. Snoring - Ambulatory referral to Pulmonology - CBC with Differential/Platelet  15. Urinary frequency - Urinalysis, Complete - Urine Culture  16. Urge incontinence - Urinalysis, Complete - Urine Culture  17. Elevated INR Vit K given today Will hold warfarin today and then restart with 5 mg everyday instead of 7.5 mg Monday, Wednesday, and Friday, and 5 mg all other days.  - phytonadione (VITAMIN K)  tablet 2.5 mg   Labs pending Health  Maintenance reviewed Diet and exercise encouraged  Follow up plan: 1 week with Clinical Pharmacy,   Evelina Dun, FNP

## 2017-11-22 NOTE — Patient Instructions (Signed)

## 2017-11-23 LAB — CMP14+EGFR
ALT: 15 IU/L (ref 0–32)
AST: 20 IU/L (ref 0–40)
Albumin/Globulin Ratio: 1.6 (ref 1.2–2.2)
Albumin: 4.1 g/dL (ref 3.5–4.8)
Alkaline Phosphatase: 92 IU/L (ref 39–117)
BUN/Creatinine Ratio: 18 (ref 12–28)
BUN: 17 mg/dL (ref 8–27)
Bilirubin Total: 0.5 mg/dL (ref 0.0–1.2)
CO2: 23 mmol/L (ref 20–29)
Calcium: 9.7 mg/dL (ref 8.7–10.3)
Chloride: 105 mmol/L (ref 96–106)
Creatinine, Ser: 0.92 mg/dL (ref 0.57–1.00)
GFR calc Af Amer: 70 mL/min/{1.73_m2} (ref >59)
GFR calc non Af Amer: 61 mL/min/{1.73_m2} (ref >59)
Globulin, Total: 2.6 g/dL (ref 1.5–4.5)
Glucose: 174 mg/dL — ABNORMAL HIGH (ref 65–99)
Potassium: 4.5 mmol/L (ref 3.5–5.2)
Sodium: 144 mmol/L (ref 134–144)
Total Protein: 6.7 g/dL (ref 6.0–8.5)

## 2017-11-23 LAB — CBC WITH DIFFERENTIAL/PLATELET
Basophils Absolute: 0 10*3/uL (ref 0.0–0.2)
Basos: 0 %
EOS (ABSOLUTE): 0.2 10*3/uL (ref 0.0–0.4)
Eos: 3 %
Hematocrit: 42.1 % (ref 34.0–46.6)
Hemoglobin: 13.5 g/dL (ref 11.1–15.9)
Immature Grans (Abs): 0 10*3/uL (ref 0.0–0.1)
Immature Granulocytes: 0 %
Lymphocytes Absolute: 1.6 10*3/uL (ref 0.7–3.1)
Lymphs: 24 %
MCH: 30.4 pg (ref 26.6–33.0)
MCHC: 32.1 g/dL (ref 31.5–35.7)
MCV: 95 fL (ref 79–97)
Monocytes Absolute: 0.6 10*3/uL (ref 0.1–0.9)
Monocytes: 9 %
Neutrophils Absolute: 4.2 10*3/uL (ref 1.4–7.0)
Neutrophils: 64 %
Platelets: 205 10*3/uL (ref 150–450)
RBC: 4.44 x10E6/uL (ref 3.77–5.28)
RDW: 14.9 % (ref 12.3–15.4)
WBC: 6.7 10*3/uL (ref 3.4–10.8)

## 2017-11-23 LAB — URINE CULTURE

## 2017-11-25 DIAGNOSIS — E119 Type 2 diabetes mellitus without complications: Secondary | ICD-10-CM | POA: Diagnosis not present

## 2017-11-28 ENCOUNTER — Other Ambulatory Visit: Payer: Self-pay | Admitting: Cardiology

## 2017-11-29 ENCOUNTER — Ambulatory Visit: Payer: Medicare Other | Admitting: Pharmacist Clinician (PhC)/ Clinical Pharmacy Specialist

## 2017-11-29 DIAGNOSIS — I480 Paroxysmal atrial fibrillation: Secondary | ICD-10-CM | POA: Diagnosis not present

## 2017-11-29 DIAGNOSIS — Z7901 Long term (current) use of anticoagulants: Secondary | ICD-10-CM | POA: Diagnosis not present

## 2017-11-29 LAB — COAGUCHEK XS/INR WAIVED
INR: 3.2 — ABNORMAL HIGH (ref 0.9–1.1)
Prothrombin Time: 39 s

## 2017-11-29 NOTE — Patient Instructions (Signed)
Description   Take 1 tablet a day (5mg )  Eat consistently your foods high in vitamin K each week  INR today is 3.2 (goal is 2-3)  Just a little thin today

## 2017-12-18 ENCOUNTER — Other Ambulatory Visit: Payer: Self-pay | Admitting: Family

## 2017-12-25 ENCOUNTER — Encounter: Payer: Self-pay | Admitting: Pharmacist Clinician (PhC)/ Clinical Pharmacy Specialist

## 2017-12-26 ENCOUNTER — Other Ambulatory Visit: Payer: Self-pay | Admitting: Family

## 2017-12-27 NOTE — Telephone Encounter (Signed)
Last Vit D 05/22/17  29.3

## 2017-12-28 ENCOUNTER — Emergency Department (HOSPITAL_COMMUNITY): Payer: Medicare Other

## 2017-12-28 ENCOUNTER — Emergency Department (HOSPITAL_COMMUNITY)
Admission: EM | Admit: 2017-12-28 | Discharge: 2017-12-28 | Disposition: A | Discharge status: Home / Self Care | Payer: Medicare Other | Attending: Emergency Medicine | Admitting: Emergency Medicine

## 2017-12-28 ENCOUNTER — Other Ambulatory Visit: Payer: Self-pay

## 2017-12-28 ENCOUNTER — Encounter (HOSPITAL_COMMUNITY): Payer: Self-pay | Admitting: Emergency Medicine

## 2017-12-28 DIAGNOSIS — E039 Hypothyroidism, unspecified: Secondary | ICD-10-CM | POA: Insufficient documentation

## 2017-12-28 DIAGNOSIS — I10 Essential (primary) hypertension: Secondary | ICD-10-CM | POA: Insufficient documentation

## 2017-12-28 DIAGNOSIS — Z87891 Personal history of nicotine dependence: Secondary | ICD-10-CM | POA: Insufficient documentation

## 2017-12-28 DIAGNOSIS — Z7901 Long term (current) use of anticoagulants: Secondary | ICD-10-CM | POA: Diagnosis not present

## 2017-12-28 DIAGNOSIS — E119 Type 2 diabetes mellitus without complications: Secondary | ICD-10-CM | POA: Insufficient documentation

## 2017-12-28 DIAGNOSIS — Z79899 Other long term (current) drug therapy: Secondary | ICD-10-CM | POA: Diagnosis not present

## 2017-12-28 DIAGNOSIS — I480 Paroxysmal atrial fibrillation: Secondary | ICD-10-CM | POA: Insufficient documentation

## 2017-12-28 DIAGNOSIS — M5432 Sciatica, left side: Secondary | ICD-10-CM | POA: Diagnosis not present

## 2017-12-28 DIAGNOSIS — M25552 Pain in left hip: Secondary | ICD-10-CM | POA: Diagnosis not present

## 2017-12-28 DIAGNOSIS — I2510 Atherosclerotic heart disease of native coronary artery without angina pectoris: Secondary | ICD-10-CM | POA: Diagnosis not present

## 2017-12-28 DIAGNOSIS — Z7984 Long term (current) use of oral hypoglycemic drugs: Secondary | ICD-10-CM | POA: Diagnosis not present

## 2017-12-28 DIAGNOSIS — M545 Low back pain: Secondary | ICD-10-CM | POA: Diagnosis not present

## 2017-12-28 MED ORDER — METHOCARBAMOL 500 MG PO TABS: 500.0000 mg | ORAL_TABLET | Freq: Three times a day (TID) | ORAL | 0 refills | Status: DC

## 2017-12-28 MED ORDER — HYDROCODONE-ACETAMINOPHEN 5-325 MG PO TABS
1.0000 | ORAL_TABLET | Freq: Once | ORAL | Status: AC
  Administered 2017-12-28: 1 via ORAL
  Filled 2017-12-28: qty 1

## 2017-12-28 MED ORDER — HYDROCODONE-ACETAMINOPHEN 5-325 MG PO TABS: 1.0000 | ORAL_TABLET | Freq: Four times a day (QID) | ORAL | 0 refills | Status: DC | PRN

## 2017-12-28 NOTE — Discharge Instructions (Addendum)
Do not drive within 4 hours of taking hydrocodone as this will make you drowsy.  Avoid lifting,  Bending,  Twisting or any other activity that worsens your pain over the next week.  Apply a heating pad to your left buttock and hip area for 20 minutes 3 to 4 times daily.  You should get rechecked if your symptoms are not better over the next 5 days,  Or you develop increased pain,  Weakness in your leg(s) or loss of bladder or bowel function - these are symptoms of a worse injury.

## 2017-12-28 NOTE — ED Triage Notes (Signed)
Pt states that she is having pain in her left hip and lower back

## 2017-12-29 NOTE — ED Provider Notes (Signed)
[facility] EMERGENCY DEPARTMENT Provider Note   CSN: [id number] Arrival date & time: 12/28/17  1656     History   Chief Complaint Chief Complaint  Patient presents with  . Hip Pain  . Back Pain    HPI Rhonda Garcia is a 77 y.o. female.  The history is provided by the patient and the spouse.  Hip Pain  This is a new problem. The current episode started yesterday. The problem occurs constantly. The problem has been gradually worsening. Pertinent negatives include no chest pain, no abdominal pain and no shortness of breath. The symptoms are aggravated by walking, bending and standing (movement increases pain). Nothing relieves the symptoms. She has tried rest and acetaminophen for the symptoms. The treatment provided no relief.    Pt with left sided buttock pain with radiation into her left lateral thigh to her knee, aching in character. Denies low back pain. Also no h/o injury or fall.  She has no fevers, chills, dysuria, no abdominal pain or distention.  No lower extremity weakness, denies urinary or fecal incontinence or retention.  She has applied heat which gives brief relief. Worked as a Programmer, applications, retired several months ago, had patient that required lifting with occasional low back pain, but denies back pain today. No IV or IM drug use, no cancer history.     Past Medical History:  Diagnosis Date  . A-fib ([place])   . Anal fissure    resolved   . Anemia   . Back pain    with left radiculopathy  . CAD (coronary artery disease)   . Cataract   . DDD (degenerative disc disease)   . Depression   . Diabetes mellitus   . Diverticulosis   . Dyslipidemia   . Dysrhythmia    AFib  . Esophagitis   . GERD (gastroesophageal reflux disease)   . Hiatal hernia   . Hx of peptic ulcer   . Hyperlipidemia   . Hypertension   . Hypothyroidism   . Internal hemorrhoids 2017  . NSTEMI (non-ST elevated myocardial infarction) ([place]) 08/05/2015  . Osteopenia   . Sleep apnea    not  using CPAP now, could not tolerate and PCP aware.  . Thyroid disease     Patient Active Problem List   Diagnosis Date Noted  . Shortness of breath 01/09/2017  . Morbid obesity ([place]) 11/05/2016  . Vertigo 06/27/2016  . GAD (generalized anxiety disorder) 10/27/2015  . Coronary artery disease involving native coronary artery of native heart with unstable angina pectoris ([place])   . NSTEMI (non-ST elevated myocardial infarction) ([place]) 08/05/2015  . Hx of adenomatous colonic polyps 06/22/2015  . Hypothyroidism 01/17/2015  . Vitamin D deficiency 09/09/2014  . Hiatal hernia   . Dysphagia, pharyngoesophageal phase   . History of colonic polyps   . Diverticulosis of colon without hemorrhage   . Paroxysmal atrial fibrillation ([place]) 11/02/2013  . Long term current use of anticoagulant therapy 11/02/2013  . Depression 10/28/2012  . Type 2 diabetes mellitus with hyperglycemia ([place]) 10/28/2012  . Lipoma of back 02/12/2012  . Constipation 02/28/2011  . Esophageal dysphagia 02/28/2011  . Gastroesophageal reflux 02/28/2011  . POSITIONAL VERTIGO 04/29/2009  . Osteoarthritis 12/23/2008  . Hyperlipidemia associated with type 2 diabetes mellitus ([place]) 12/21/2008  . Hypertension associated with diabetes ([place]) 12/21/2008    Past Surgical History:  Procedure Laterality Date  . ABDOMINAL HYSTERECTOMY Bilateral 2001 - approximate  . APPENDECTOMY    . BREAST REDUCTION SURGERY    .  CARDIAC CATHETERIZATION N/A 08/08/2015   Procedure: Left Heart Cath and Coronary Angiography;  Surgeon: Burnell Blanks, MD;  Location: [facility];  Service: Cardiovascular;  Laterality: N/A;  . CHOLECYSTECTOMY  2008 - approximate  . COLONOSCOPY  06/2007   Friable anal canal and anal papillae. Pancolonic diverticula. Normal terminal ileum. Status post segmental biopsy, descending colon biopsy showed minimal cryptitis. Biopsies felt to be  nonspecific but could be seen with NSAIDs. No features of inflammatory bowel  disease. Stool studies were negative.  . COLONOSCOPY  03/21/2011   RMR: colonic polyps treated as described above, colonic diverticulosis (Tubular adenomas)  . COLONOSCOPY WITH PROPOFOL N/A 06/17/2014   RMR: Colonic diverticulosis. Multiple colonic polyps removed as described above.   . COLONOSCOPY WITH PROPOFOL N/A 03/19/2016   Procedure: COLONOSCOPY WITH PROPOFOL;  Surgeon: Daneil Dolin, MD;  [facility];  Service: Endoscopy;  Laterality: N/A;  8:45am  . CORONARY ANGIOPLASTY WITH STENT PLACEMENT     4 stents  . ESOPHAGEAL DILATION N/A 06/17/2014   Procedure: ESOPHAGEAL DILATION;  Surgeon: Daneil Dolin, MD;  Location: [facility];  Service: Endoscopy;  Laterality: N/A;  Maloney 52 Fr  . ESOPHAGOGASTRODUODENOSCOPY  06/2007   Small hiatal hernia  . ESOPHAGOGASTRODUODENOSCOPY  03/21/2011   RMR: normal esophagus- status post passage of Maloney dilator. Small hiatal hernia. Trivial antral and bulbar erosions  . ESOPHAGOGASTRODUODENOSCOPY (EGD) WITH PROPOFOL N/A 06/17/2014   RMR: Small hiatal hernia; otherwise normal EGD. Status post passage of a Maloney dilator. No explanation for patients right upper quadrant abdominal pain.   Marland Kitchen ESOPHAGOGASTRODUODENOSCOPY (EGD) WITH PROPOFOL N/A 03/19/2016   Procedure: ESOPHAGOGASTRODUODENOSCOPY (EGD) WITH PROPOFOL;  Surgeon: Daneil Dolin, MD;  [facility];  Service: Endoscopy;  Laterality: N/A;  . KNEE SURGERY Left    arthroscopy  . LIPOMA EXCISION  03/17/2012   Procedure: EXCISION LIPOMA;  Surgeon: Gwenyth Ober, MD;  Location: [facility];  Service: General;  Laterality: Right;  excision of right lower back lipoma  . MALONEY DILATION N/A 03/19/2016   Procedure: Venia Minks DILATION;  Surgeon: Daneil Dolin, MD;  [facility];  Service: Endoscopy;  Laterality: N/A;  . POLYPECTOMY  06/17/2014   Procedure: POLYPECTOMY;  Surgeon: Daneil Dolin, MD;  Location: [facility];  Service: Endoscopy;;     OB History   None       Home Medications    Prior to Admission medications   Medication Sig Start Date End Date Taking? Authorizing Provider  albuterol (PROVENTIL HFA;VENTOLIN HFA) 108 (90 Base) MCG/ACT inhaler Inhale 2 puffs into the lungs every 6 (six) hours as needed for wheezing or shortness of breath. 06/07/17   Janora Norlander, DO  ALPRAZolam Duanne Moron) 0.5 MG tablet Take 1 tablet (0.5 mg total) by mouth at bedtime as needed. 02/07/17   Evelina Dun A, FNP  amLODipine (NORVASC) 10 MG tablet Take 1 tablet (10 mg total) by mouth daily. 01/10/17   Evelina Dun A, FNP  escitalopram (LEXAPRO) 10 MG tablet Take 1 tablet (10 mg total) by mouth daily. 01/10/17   Sharion Balloon, FNP  HYDROcodone-acetaminophen (NORCO/VICODIN) 5-325 MG tablet Take 1 tablet by mouth every 6 (six) hours as needed for severe pain. 12/28/17   Evalee Jefferson, PA-C  isosorbide mononitrate (IMDUR) 60 MG 24 hr tablet Take 1 tablet (60 mg total) by mouth daily. 01/09/17   Minus Breeding, MD  JANUVIA 100 MG tablet TAKE ONE (1) TABLET EACH DAY 11/14/17  Hawks, Christy A, FNP  levothyroxine (SYNTHROID) 50 MCG tablet Take 1 tablet (50 mcg total) by mouth daily. 01/10/17 01/10/18  Sharion Balloon, FNP  linaclotide Rolan Lipa) 72 MCG capsule Take 1 capsule (72 mcg total) by mouth daily before breakfast. 09/05/17   Sharion Balloon, FNP  lisinopril-hydrochlorothiazide (PRINZIDE,ZESTORETIC) 20-12.5 MG tablet Take 2 tablets by mouth daily. 01/10/17   Sharion Balloon, FNP  metFORMIN (GLUCOPHAGE-XR) 500 MG 24 hr tablet TAKE 2 TABLETS BY MOUTH DAILY WITH BREAKFAST 10/05/16   Hawks, Christy A, FNP  methocarbamol (ROBAXIN) 500 MG tablet Take 1 tablet (500 mg total) by mouth 3 (three) times daily. 12/28/17   Evalee Jefferson, PA-C  metoprolol tartrate (LOPRESSOR) 50 MG tablet TAKE ONE TABLET BY MOUTH TWICE DAILY 01/11/17   Evelina Dun A, FNP  nitroGLYCERIN (NITROSTAT) 0.4 MG SL tablet Place 1 tablet (0.4 mg total) under the tongue every 5 (five) minutes as needed for  chest pain. 01/10/17   Evelina Dun A, FNP  pantoprazole (PROTONIX) 40 MG tablet TAKE ONE TABLET BY MOUTH TWICE DAILY 11/14/17   Evelina Dun A, FNP  pravastatin (PRAVACHOL) 80 MG tablet Take 1 tablet (80 mg total) by mouth every evening. NEED OV. 11/28/17   Minus Breeding, MD  spironolactone (ALDACTONE) 25 MG tablet TAKE ONE (1) TABLET EACH DAY 12/19/17   Hawks, Alyse Low A, FNP  traMADol (ULTRAM) 50 MG tablet Take 1 tablet (50 mg total) by mouth every 12 (twelve) hours as needed. 02/07/17   Sharion Balloon, FNP  Vitamin D, Ergocalciferol, (DRISDOL) 50000 units CAPS capsule TAKE 1 CAPSULE EVERY 7 DAYS 12/27/17   Evelina Dun A, FNP  warfarin (COUMADIN) 5 MG tablet TAKE 1 TO 1&1/2 TABLET DAILY AS DIRECTED 08/13/17   Sharion Balloon, FNP    Family History Family History  Problem Relation Age of Onset  . Heart attack Mother   . Hypertension Mother   . Stroke Mother        Deceased, age 77  . Diabetes Sister   . Diabetes Sister   . Cancer Sister        ovarian  . Stroke Sister   . Diabetes Brother        also has a blood disorder   . Clotting disorder Brother   . Kidney disease Brother   . Heart disease Brother   . Melanoma Brother        deceased  . Coronary artery disease Other   . Kidney disease Other   . Diabetes Son   . Hypertension Son   . Colon cancer Neg Hx     Social History Social History   Tobacco Use  . Smoking status: Former Smoker    Packs/day: 0.50    Years: 5.00    Pack years: 2.50    Types: Cigarettes    Last attempt to quit: 03/13/1981    Years since quitting: 36.8  . Smokeless tobacco: Never Used  . Tobacco comment: quit 30 +years ago  Substance Use Topics  . Alcohol use: Yes    Alcohol/week: 1.0 standard drinks    Types: 1 Glasses of wine per week  . Drug use: No     Allergies   Promethazine hcl   Review of Systems Review of Systems  Constitutional: Negative for chills and fever.  Respiratory: Negative for shortness of breath.    Cardiovascular: Negative for chest pain, palpitations and leg swelling.  Gastrointestinal: Negative for abdominal distention, abdominal pain, constipation, nausea and vomiting.  Genitourinary:  Negative for difficulty urinating, dysuria, flank pain, frequency and urgency.  Musculoskeletal: Positive for arthralgias. Negative for back pain, gait problem and joint swelling.  Skin: Negative for rash and wound.  Neurological: Negative for weakness and numbness.     Physical Exam Updated Vital Signs BP 140/60 (BP Location: Right Arm)   Pulse 97   Temp (!) 97.5 F (36.4 C) (Oral)   Resp 20   Ht 5' (1.524 m)   Wt 84.8 kg   SpO2 100%   BMI 36.52 kg/m   Physical Exam  Constitutional: She appears well-developed and well-nourished.  HENT:  Head: Normocephalic.  Eyes: Conjunctivae are normal.  Neck: Normal range of motion. Neck supple.  Cardiovascular: Normal rate and intact distal pulses.  Pulses:      Dorsalis pedis pulses are 2+ on the right side, and 2+ on the left side.  Pedal pulses normal.  Pulmonary/Chest: Effort normal.  Abdominal: Soft. Bowel sounds are normal. She exhibits no distension, no abdominal bruit, no pulsatile midline mass and no mass.  Musculoskeletal: Normal range of motion. She exhibits no edema.       Lumbar back: She exhibits no tenderness, no bony tenderness, no swelling, no edema and no spasm.       Back:  ttp left SI  Which triggers radiation of pain to the left lateral thigh.  No ttp over greater trochanter. No pain with hip rotation/flex/ext.    Neurological: She is alert. She has normal strength. She displays no atrophy and no tremor. No sensory deficit. Gait normal. She displays no Babinski's sign on the right side. She displays no Babinski's sign on the left side.  Reflex Scores:      Patellar reflexes are 2+ on the right side and 2+ on the left side. No strength deficit noted in hip and knee flexor and extensor muscle groups.  Ankle flexion and  extension intact. Distal sensation intact.    Skin: Skin is warm and dry.  Psychiatric: She has a normal mood and affect.  Nursing note and vitals reviewed.    ED Treatments / Results  Labs (all labs ordered are listed, but only abnormal results are displayed) Labs Reviewed - No data to display  EKG None  Radiology Dg Lumbar Spine Complete  Result Date: 12/28/2017 CLINICAL DATA:  Low back pain EXAM: LUMBAR SPINE - COMPLETE 4+ VIEW COMPARISON:  CT 01/29/2017 FINDINGS: Degenerative facet disease in the lumbar spine. Disc spaces maintained. No fracture or malalignment. SI joints are symmetric and unremarkable. IMPRESSION: Degenerative facet disease.  No acute bony abnormality. Electronically Signed   By: Rolm Baptise M.D.   On: 12/28/2017 19:09   Dg Hip Unilat W Or Wo Pelvis 2-3 Views Left  Result Date: 12/28/2017 CLINICAL DATA:  Left hip pain EXAM: DG HIP (WITH OR WITHOUT PELVIS) 2-3V LEFT COMPARISON:  None. FINDINGS: Frontal pelvis shows no fracture. SI joints and symphysis pubis unremarkable. AP and frog-leg lateral views of the left hip show no femoral neck fracture. There is no worrisome lytic or sclerotic osseous abnormality. IMPRESSION: Negative. Electronically Signed   By: Misty Stanley M.D.   On: 12/28/2017 19:09    Procedures Procedures (including critical care time)  Medications Ordered in ED Medications  HYDROcodone-acetaminophen (NORCO/VICODIN) 5-325 MG per tablet 1 tablet (1 tablet Oral Given 12/28/17 1806)     Initial Impression / Assessment and Plan / ED Course  I have reviewed the triage vital signs and the nursing notes.  Pertinent labs & imaging results  that were available during my care of the patient were reviewed by me and considered in my medical decision making (see chart for details).     Pt with exam and hx suggesting left sciatica.  No neuro deficits, no lumbar ttp, exam and hx/risk factors not suggesting epidural abscess or emergent nerve compression,  no cauda equina. No findings to suggest AAA.  She was given hydrocodone here with partial improvement in pain, ambulatory after tx without antalgic gait.  Added robaxin as well, continued heat, activities as tolerated Prn f/u pcp x 1 week if not resolved, return precautions discussed.  Discussed with Dr. Thurnell Garbe prior to dc home.  Final Clinical Impressions(s) / ED Diagnoses   Final diagnoses:  Sciatica of left side    ED Discharge Orders         Ordered    HYDROcodone-acetaminophen (NORCO/VICODIN) 5-325 MG tablet  Every 6 hours PRN     12/28/17 1924    methocarbamol (ROBAXIN) 500 MG tablet  3 times daily     12/28/17 1924           Evalee Jefferson, PA-C 12/29/17 [place], Wolfe, DO 01/01/18 1550

## 2018-01-01 ENCOUNTER — Ambulatory Visit: Payer: Medicare Other | Admitting: Pharmacist Clinician (PhC)/ Clinical Pharmacy Specialist

## 2018-01-01 DIAGNOSIS — I480 Paroxysmal atrial fibrillation: Secondary | ICD-10-CM

## 2018-01-01 DIAGNOSIS — Z7901 Long term (current) use of anticoagulants: Secondary | ICD-10-CM

## 2018-01-01 LAB — COAGUCHEK XS/INR WAIVED
INR: 3.2 — ABNORMAL HIGH (ref 0.9–1.1)
Prothrombin Time: 38.9 s

## 2018-01-01 NOTE — Patient Instructions (Signed)
Description   Take 1 tablet a day (5mg )  Eat consistently your foods high in vitamin K each week   INR today is 3.2 (goal is 2-3)  Just a little thin today

## 2018-01-10 ENCOUNTER — Institutional Professional Consult (permissible substitution): Payer: Medicare Other | Admitting: Pulmonary Disease

## 2018-01-15 ENCOUNTER — Other Ambulatory Visit: Payer: Self-pay | Admitting: Family

## 2018-01-15 DIAGNOSIS — Z7901 Long term (current) use of anticoagulants: Secondary | ICD-10-CM

## 2018-02-03 ENCOUNTER — Encounter: Payer: Self-pay | Admitting: Internal Medicine

## 2018-02-04 NOTE — Progress Notes (Signed)
HPI The patient presents for evaluation of known coronary disease and atrial fibrillation.   Cath for chest pain in 2017 demonstrated nonobstructive disease in large vessels with an occluded PL off the RCA.  She had patent stents in the RI, RCA and LAD.  She was managed medically.  She returns for follow up.    Since I last saw her she has had no new complaints.  She has some chronic back pain.  She has chronic dyspnea walking to the mailbox or to her son's house.  He lives next door.  This seems to be unchanged from previous.  She is had no new chest pressure, neck or arm discomfort.  She has had no new palpitations, presyncope or syncope.  She gets reflux after eating and lying down at night.  She got married in Jan.  She has stress as her son is on drugs and his wife died of an overdose.   Allergies  Allergen Reactions  . Promethazine Hcl Anaphylaxis    Current Outpatient Medications  Medication Sig Dispense Refill  . albuterol (PROVENTIL HFA;VENTOLIN HFA) 108 (90 Base) MCG/ACT inhaler Inhale 2 puffs into the lungs every 6 (six) hours as needed for wheezing or shortness of breath. 1 Inhaler 0  . ALPRAZolam (XANAX) 0.5 MG tablet Take 1 tablet (0.5 mg total) by mouth at bedtime as needed. 30 tablet 4  . amLODipine (NORVASC) 10 MG tablet Take 1 tablet (10 mg total) by mouth daily. 90 tablet 3  . escitalopram (LEXAPRO) 10 MG tablet Take 1 tablet (10 mg total) by mouth daily. 90 tablet 3  . HYDROcodone-acetaminophen (NORCO/VICODIN) 5-325 MG tablet Take 1 tablet by mouth every 6 (six) hours as needed for severe pain. 15 tablet 0  . isosorbide mononitrate (IMDUR) 60 MG 24 hr tablet Take 1 tablet (60 mg total) by mouth daily. 90 tablet 3  . JANUVIA 100 MG tablet TAKE ONE (1) TABLET EACH DAY 90 tablet 0  . levothyroxine (SYNTHROID) 50 MCG tablet Take 1 tablet (50 mcg total) by mouth daily. 90 tablet 1  . linaclotide (LINZESS) 72 MCG capsule Take 1 capsule (72 mcg total) by mouth daily before  breakfast. 30 capsule 3  . lisinopril-hydrochlorothiazide (PRINZIDE,ZESTORETIC) 20-12.5 MG tablet Take 2 tablets by mouth daily. 180 tablet 6  . metFORMIN (GLUCOPHAGE-XR) 500 MG 24 hr tablet TAKE 2 TABLETS BY MOUTH DAILY WITH BREAKFAST 180 tablet 0  . methocarbamol (ROBAXIN) 500 MG tablet Take 1 tablet (500 mg total) by mouth 3 (three) times daily. 21 tablet 0  . metoprolol tartrate (LOPRESSOR) 50 MG tablet TAKE ONE TABLET BY MOUTH TWICE DAILY 180 tablet 1  . nitroGLYCERIN (NITROSTAT) 0.4 MG SL tablet Place 1 tablet (0.4 mg total) under the tongue every 5 (five) minutes as needed for chest pain. 20 tablet 1  . pantoprazole (PROTONIX) 40 MG tablet TAKE ONE TABLET BY MOUTH TWICE DAILY 180 tablet 0  . pravastatin (PRAVACHOL) 80 MG tablet Take 1 tablet (80 mg total) by mouth every evening. NEED OV. 90 tablet 0  . spironolactone (ALDACTONE) 25 MG tablet TAKE ONE (1) TABLET EACH DAY 90 tablet 0  . traMADol (ULTRAM) 50 MG tablet Take 1 tablet (50 mg total) by mouth every 12 (twelve) hours as needed. 60 tablet 5  . Vitamin D, Ergocalciferol, (DRISDOL) 50000 units CAPS capsule TAKE 1 CAPSULE EVERY 7 DAYS 12 capsule 0  . warfarin (COUMADIN) 5 MG tablet TAKE 1 TO 1&1/2 TABLET DAILY AS DIRECTED 135 tablet 0  No current facility-administered medications for this visit.     Past Medical History:  Diagnosis Date  . A-fib ([place])   . Anal fissure    resolved   . Anemia   . Back pain    with left radiculopathy  . CAD (coronary artery disease)   . Cataract   . DDD (degenerative disc disease)   . Depression   . Diabetes mellitus   . Diverticulosis   . Dyslipidemia   . Dysrhythmia    AFib  . Esophagitis   . GERD (gastroesophageal reflux disease)   . Hiatal hernia   . Hx of peptic ulcer   . Hyperlipidemia   . Hypertension   . Hypothyroidism   . Internal hemorrhoids 2017  . NSTEMI (non-ST elevated myocardial infarction) ([place]) 08/05/2015  . Osteopenia   . Sleep apnea    not using CPAP now, could  not tolerate and PCP aware.  . Thyroid disease     Past Surgical History:  Procedure Laterality Date  . ABDOMINAL HYSTERECTOMY Bilateral 2001 - approximate  . APPENDECTOMY    . BREAST REDUCTION SURGERY    . CARDIAC CATHETERIZATION N/A 08/08/2015   Procedure: Left Heart Cath and Coronary Angiography;  Surgeon: Burnell Blanks, MD;  Location: [facility];  Service: Cardiovascular;  Laterality: N/A;  . CHOLECYSTECTOMY  2008 - approximate  . COLONOSCOPY  06/2007   Friable anal canal and anal papillae. Pancolonic diverticula. Normal terminal ileum. Status post segmental biopsy, descending colon biopsy showed minimal cryptitis. Biopsies felt to be  nonspecific but could be seen with NSAIDs. No features of inflammatory bowel disease. Stool studies were negative.  . COLONOSCOPY  03/21/2011   RMR: colonic polyps treated as described above, colonic diverticulosis (Tubular adenomas)  . COLONOSCOPY WITH PROPOFOL N/A 06/17/2014   RMR: Colonic diverticulosis. Multiple colonic polyps removed as described above.   . COLONOSCOPY WITH PROPOFOL N/A 03/19/2016   Procedure: COLONOSCOPY WITH PROPOFOL;  Surgeon: Daneil Dolin, MD;  [facility];  Service: Endoscopy;  Laterality: N/A;  8:45am  . CORONARY ANGIOPLASTY WITH STENT PLACEMENT     4 stents  . ESOPHAGEAL DILATION N/A 06/17/2014   Procedure: ESOPHAGEAL DILATION;  Surgeon: Daneil Dolin, MD;  [facility];  Service: Endoscopy;  Laterality: N/A;  Maloney 23 Fr  . ESOPHAGOGASTRODUODENOSCOPY  06/2007   Small hiatal hernia  . ESOPHAGOGASTRODUODENOSCOPY  03/21/2011   RMR: normal esophagus- status post passage of Maloney dilator. Small hiatal hernia. Trivial antral and bulbar erosions  . ESOPHAGOGASTRODUODENOSCOPY (EGD) WITH PROPOFOL N/A 06/17/2014   RMR: Small hiatal hernia; otherwise normal EGD. Status post passage of a Maloney dilator. No explanation for patients right upper quadrant abdominal pain.   Marland Kitchen ESOPHAGOGASTRODUODENOSCOPY  (EGD) WITH PROPOFOL N/A 03/19/2016   Procedure: ESOPHAGOGASTRODUODENOSCOPY (EGD) WITH PROPOFOL;  Surgeon: Daneil Dolin, MD;  [facility];  Service: Endoscopy;  Laterality: N/A;  . KNEE SURGERY Left    arthroscopy  . LIPOMA EXCISION  03/17/2012   Procedure: EXCISION LIPOMA;  Surgeon: Gwenyth Ober, MD;  Location: [facility];  Service: General;  Laterality: Right;  excision of right lower back lipoma  . MALONEY DILATION N/A 03/19/2016   Procedure: Venia Minks DILATION;  Surgeon: Daneil Dolin, MD;  [facility];  Service: Endoscopy;  Laterality: N/A;  . POLYPECTOMY  06/17/2014   Procedure: POLYPECTOMY;  Surgeon: Daneil Dolin, MD;  [facility];  Service: Endoscopy;;    ROS:  As stated  in the HPI and negative for all other systems.  PHYSICAL EXAM BP 122/70   Pulse 65   Ht 5\' 2"  (1.575 m)   Wt 199 lb (90.3 kg)   BMI 36.40 kg/m  GENERAL:  Well appearing, dentures NECK:  No jugular venous distention, waveform within normal limits, carotid upstroke brisk and symmetric, no bruits, no thyromegaly LUNGS:  Clear to auscultation bilaterally CHEST:  Unremarkable HEART:  PMI not displaced or sustained,S1 and S2 within normal limits, no S3, no S4, no clicks, no rubs, no murmurs ABD:  Flat, positive bowel sounds normal in frequency in pitch, no bruits, no rebound, no guarding, no midline pulsatile mass, no hepatomegaly, no splenomegaly EXT:  2 plus pulses throughout, no edema, no cyanosis no clubbing   EKG: Snus rhythm, rate 65 axis within normal limits, intervals within normal limits, no acute ST-T wave changes.  02/05/2018   Lab Results  Component Value Date   CHOL 144 08/20/2017   TRIG 96 08/20/2017   HDL 43 08/20/2017   LDLCALC 82 08/20/2017    ASSESSMENT AND PLAN  ATRIAL FIB  Ms. WANDALENE ABRAMS has a CHA2DS2 - VASc score of 5 with a risk of stroke of 6.7%.  She might have some slight symptomatic paroxysms but it is hard to tell.  No change in  therapy.  CAD, NATIVE VESSEL  The patient has no new sypmtoms.  No further cardiovascular testing is indicated.  We will continue with aggressive risk reduction and meds as listed.  HYPERTENSION, UNSPECIFIED The blood pressure is at target.  No change in therapy.   HYPERLIPIDEMIA LDL is slightly high.  No change in therapy.   DYSPNEA I questioned her carefully and think that this is at baseline.  No change in therapy or further evaluation is indicated as I think this is a chronic issue.  This was evident at the time of her catheterization 2017 which point she had nonobstructive or small vessel disease.   DM No change in therapy.   AIC was 6.7.

## 2018-02-05 ENCOUNTER — Encounter: Payer: Self-pay | Admitting: Cardiology

## 2018-02-05 ENCOUNTER — Ambulatory Visit: Payer: Medicare Other | Admitting: Pharmacist Clinician (PhC)/ Clinical Pharmacy Specialist

## 2018-02-05 ENCOUNTER — Ambulatory Visit: Payer: Medicare Other | Admitting: Cardiology

## 2018-02-05 VITALS — BP 122/70 | HR 65 | Ht 62.0 in | Wt 199.0 lb

## 2018-02-05 DIAGNOSIS — I10 Essential (primary) hypertension: Secondary | ICD-10-CM | POA: Diagnosis not present

## 2018-02-05 DIAGNOSIS — E785 Hyperlipidemia, unspecified: Secondary | ICD-10-CM | POA: Diagnosis not present

## 2018-02-05 DIAGNOSIS — Z7901 Long term (current) use of anticoagulants: Secondary | ICD-10-CM | POA: Diagnosis not present

## 2018-02-05 DIAGNOSIS — R0602 Shortness of breath: Secondary | ICD-10-CM

## 2018-02-05 DIAGNOSIS — I2510 Atherosclerotic heart disease of native coronary artery without angina pectoris: Secondary | ICD-10-CM | POA: Diagnosis not present

## 2018-02-05 DIAGNOSIS — I480 Paroxysmal atrial fibrillation: Secondary | ICD-10-CM | POA: Diagnosis not present

## 2018-02-05 LAB — COAGUCHEK XS/INR WAIVED
INR: 5.3 (ref 0.9–1.1)
Prothrombin Time: 63.7 s

## 2018-02-05 NOTE — Patient Instructions (Addendum)
Description   No warfarin today and tomorrow. Then take 1 tablet every day except for Mondays, Wednesdays, and Fridays take 1/2 tablet.  INR today is 5.3 (goal is 2-3)  Level is too thin today          Description   No warfarin today and tomorrow. Then take 1 tablet every day except for Mondays, Wednesdays, and Fridays take 1/2 tablet.  INR today is 5.3 (goal is 2-3)  Level is too thin today

## 2018-02-05 NOTE — Patient Instructions (Signed)
Medication Instructions:  The current medical regimen is effective;  continue present plan and medications.  Follow-Up: Follow up in 1 year with Dr. Percival Spanish.  You will receive a letter in the mail 2 months before you are due.  Please call us when you receive this letter to schedule your follow up appointment.  Thank you for choosing [facility]!!

## 2018-02-12 ENCOUNTER — Ambulatory Visit: Payer: Medicare Other | Admitting: Pharmacist Clinician (PhC)/ Clinical Pharmacy Specialist

## 2018-02-12 DIAGNOSIS — Z7901 Long term (current) use of anticoagulants: Secondary | ICD-10-CM | POA: Diagnosis not present

## 2018-02-12 DIAGNOSIS — I480 Paroxysmal atrial fibrillation: Secondary | ICD-10-CM | POA: Diagnosis not present

## 2018-02-12 DIAGNOSIS — Z23 Encounter for immunization: Secondary | ICD-10-CM | POA: Diagnosis not present

## 2018-02-12 LAB — COAGUCHEK XS/INR WAIVED
INR: 1.8 — ABNORMAL HIGH (ref 0.9–1.1)
Prothrombin Time: 21 s

## 2018-02-12 NOTE — Patient Instructions (Signed)
Description   Take 1 tablet on Mondays, Wednesdays, and Fridays and 1/2 tablet all other days of the week.  INR today is 1.8 (goal is 2-3)  Little thick today

## 2018-02-13 ENCOUNTER — Other Ambulatory Visit: Payer: Self-pay | Admitting: Family

## 2018-02-13 DIAGNOSIS — I10 Essential (primary) hypertension: Secondary | ICD-10-CM

## 2018-02-18 ENCOUNTER — Ambulatory Visit: Payer: Medicare Other | Admitting: Internal Medicine

## 2018-02-18 ENCOUNTER — Ambulatory Visit: Payer: Medicare Other | Admitting: Gastroenterology

## 2018-03-03 ENCOUNTER — Encounter: Payer: Medicare Other | Admitting: *Deleted

## 2018-03-05 ENCOUNTER — Ambulatory Visit (INDEPENDENT_AMBULATORY_CARE_PROVIDER_SITE_OTHER): Payer: Medicare Other | Admitting: Pharmacist Clinician (PhC)/ Clinical Pharmacy Specialist

## 2018-03-05 DIAGNOSIS — I480 Paroxysmal atrial fibrillation: Secondary | ICD-10-CM

## 2018-03-05 DIAGNOSIS — Z7901 Long term (current) use of anticoagulants: Secondary | ICD-10-CM | POA: Diagnosis not present

## 2018-03-05 LAB — COAGUCHEK XS/INR WAIVED
INR: 3.9 — ABNORMAL HIGH (ref 0.9–1.1)
Prothrombin Time: 46.4 s

## 2018-03-05 NOTE — Patient Instructions (Signed)
Description   No warfarin today and then take 1/2 tablet (2.5mg ) every day of the week  INR today is 3.9 (goal is 2-3)  A little thin today

## 2018-03-18 ENCOUNTER — Other Ambulatory Visit: Payer: Self-pay | Admitting: Family

## 2018-03-18 DIAGNOSIS — F411 Generalized anxiety disorder: Secondary | ICD-10-CM

## 2018-03-19 ENCOUNTER — Ambulatory Visit (INDEPENDENT_AMBULATORY_CARE_PROVIDER_SITE_OTHER): Payer: Medicare Other | Admitting: Pharmacist Clinician (PhC)/ Clinical Pharmacy Specialist

## 2018-03-19 DIAGNOSIS — Z7901 Long term (current) use of anticoagulants: Secondary | ICD-10-CM

## 2018-03-19 DIAGNOSIS — I480 Paroxysmal atrial fibrillation: Secondary | ICD-10-CM | POA: Diagnosis not present

## 2018-03-19 LAB — COAGUCHEK XS/INR WAIVED
INR: 2.4 — ABNORMAL HIGH (ref 0.9–1.1)
Prothrombin Time: 28.5 s

## 2018-03-19 NOTE — Patient Instructions (Signed)
Description   Continue taking 1/2 tablet (2.5mg ) every day of the week  INR today 2.4 (goal is 2-3)  Perfect reading today

## 2018-03-20 ENCOUNTER — Encounter: Payer: Self-pay | Admitting: Family

## 2018-03-20 ENCOUNTER — Ambulatory Visit: Payer: Medicare Other | Admitting: Family

## 2018-03-20 VITALS — BP 132/70 | HR 64 | Temp 97.2°F | Ht 62.0 in | Wt 199.6 lb

## 2018-03-20 DIAGNOSIS — N3001 Acute cystitis with hematuria: Secondary | ICD-10-CM | POA: Diagnosis not present

## 2018-03-20 DIAGNOSIS — K5900 Constipation, unspecified: Secondary | ICD-10-CM | POA: Diagnosis not present

## 2018-03-20 DIAGNOSIS — F411 Generalized anxiety disorder: Secondary | ICD-10-CM

## 2018-03-20 DIAGNOSIS — R350 Frequency of micturition: Secondary | ICD-10-CM

## 2018-03-20 MED ORDER — LINACLOTIDE 145 MCG PO CAPS: 145.0000 ug | ORAL_CAPSULE | Freq: Every day | ORAL | 6 refills | Status: DC

## 2018-03-20 MED ORDER — ALPRAZOLAM 0.5 MG PO TABS: 0.5000 mg | ORAL_TABLET | Freq: Every evening | ORAL | 4 refills | Status: DC | PRN

## 2018-03-20 MED ORDER — CEPHALEXIN 500 MG PO CAPS: 500.0000 mg | ORAL_CAPSULE | Freq: Two times a day (BID) | ORAL | 0 refills | Status: DC

## 2018-03-20 NOTE — Patient Instructions (Signed)

## 2018-03-20 NOTE — Progress Notes (Signed)
Subjective:    Patient ID: Rhonda Garcia, female    DOB: Jan 29, 1941, 77 y.o.   MRN: [id number]  Chief Complaint  Patient presents with  . Urinary Frequency  . Abdominal Pain    Urinary Frequency   This is a new problem. The current episode started 1 to 4 weeks ago. The problem occurs intermittently. The problem has been waxing and waning. The pain is at a severity of 0/10. The patient is experiencing no pain. Associated symptoms include flank pain, frequency, nausea and urgency. Pertinent negatives include no hematuria or vomiting. She has tried increased fluids for the symptoms. The treatment provided no relief.  Anxiety  Presents for follow-up visit. Symptoms include decreased concentration, excessive worry, irritability, nausea, nervous/anxious behavior and restlessness. The severity of symptoms is moderate. The quality of sleep is good.    Constipation  This is a chronic problem. The current episode started more than 1 year ago. The problem has been waxing and waning since onset. Her stool frequency is 4 to 5 times per week. Associated symptoms include bloating, flatus and nausea. Pertinent negatives include no vomiting. She has tried laxatives (linzess) for the symptoms. The treatment provided mild relief.      Review of Systems  Constitutional: Positive for irritability.  Gastrointestinal: Positive for bloating, constipation, flatus and nausea. Negative for vomiting.  Genitourinary: Positive for flank pain, frequency and urgency. Negative for hematuria.  Psychiatric/Behavioral: Positive for decreased concentration. The patient is nervous/anxious.   All other systems reviewed and are negative.      Objective:   Physical Exam Vitals signs reviewed.  Constitutional:      General: She is not in acute distress.    Appearance: She is well-developed.  HENT:     Head: Normocephalic and atraumatic.     Right Ear: External ear normal.  Eyes:     Pupils: Pupils are equal, round,  and reactive to light.  Neck:     Musculoskeletal: Normal range of motion and neck supple.     Thyroid: No thyromegaly.  Cardiovascular:     Rate and Rhythm: Normal rate and regular rhythm.     Heart sounds: Normal heart sounds. No murmur.  Pulmonary:     Effort: Pulmonary effort is normal. No respiratory distress.     Breath sounds: Normal breath sounds. No wheezing.  Abdominal:     General: Bowel sounds are normal. There is no distension.     Palpations: Abdomen is soft.     Tenderness: There is no abdominal tenderness. There is no right CVA tenderness or left CVA tenderness.  Musculoskeletal: Normal range of motion.        General: No tenderness.  Skin:    General: Skin is warm and dry.  Neurological:     Mental Status: She is alert and oriented to person, place, and time.     Cranial Nerves: No cranial nerve deficit.     Deep Tendon Reflexes: Reflexes are normal and symmetric.  Psychiatric:        Behavior: Behavior normal.        Thought Content: Thought content normal.        Judgment: Judgment normal.       BP 132/70   Pulse 64   Temp (!) 97.2 F (36.2 C) (Oral)   Ht 5\' 2"  (1.575 m)   Wt 199 lb 9.6 oz (90.5 kg)   BMI 36.51 kg/m      Assessment & Plan:  Rhonda Garcia  comes in today with chief complaint of Urinary Frequency and Abdominal Pain   Diagnosis and orders addressed:  1. Urine frequency - Urinalysis, Complete  2. Acute cystitis with hematuria Force fluids AZO over the counter X2 days RTO prn Culture pending - Urine Culture - cephALEXin (KEFLEX) 500 MG capsule; Take 1 capsule (500 mg total) by mouth 2 (two) times daily.  Dispense: 14 capsule; Refill: 0  3. GAD (generalized anxiety disorder) Stress management discussed - ALPRAZolam (XANAX) 0.5 MG tablet; Take 1 tablet (0.5 mg total) by mouth at bedtime as needed.  Dispense: 30 tablet; Refill: 4  4. Constipation, unspecified constipation type We will increase Linzess to 145 mcg from 72  mcg Force fluids  Healthy diet - linaclotide (LINZESS) 145 MCG CAPS capsule; Take 1 capsule (145 mcg total) by mouth daily before breakfast.  Dispense: 30 capsule; Refill: 6   Labs pending Health Maintenance reviewed Diet and exercise encouraged  Follow up plan: Keep chronic follow up   Evelina Dun, FNP'

## 2018-03-21 LAB — URINALYSIS, COMPLETE
Bilirubin, UA: NEGATIVE
Glucose, UA: NEGATIVE
Ketones, UA: NEGATIVE
Leukocytes, UA: NEGATIVE
Nitrite, UA: NEGATIVE
Protein, UA: NEGATIVE
Specific Gravity, UA: >1.03 — ABNORMAL HIGH (ref 1.005–1.030)
Urobilinogen, Ur: 0.2 mg/dL (ref 0.2–1.0)
pH, UA: 5.5 (ref 5.0–7.5)

## 2018-03-21 LAB — MICROSCOPIC EXAMINATION
Epithelial Cells (non renal): >10 /hpf — AB (ref 0–10)
Renal Epithel, UA: NONE SEEN /hpf

## 2018-03-22 LAB — URINE CULTURE

## 2018-03-27 ENCOUNTER — Encounter: Payer: Self-pay | Admitting: *Deleted

## 2018-03-27 ENCOUNTER — Ambulatory Visit (INDEPENDENT_AMBULATORY_CARE_PROVIDER_SITE_OTHER): Payer: Medicare Other | Admitting: *Deleted

## 2018-03-27 ENCOUNTER — Ambulatory Visit: Payer: Medicare Other | Admitting: *Deleted

## 2018-03-27 VITALS — BP 139/70 | HR 68 | Ht 62.0 in | Wt 200.0 lb

## 2018-03-27 DIAGNOSIS — Z0000 Encounter for general adult medical examination without abnormal findings: Secondary | ICD-10-CM | POA: Diagnosis not present

## 2018-03-27 NOTE — Patient Instructions (Addendum)
Please work on your goal of increasing your exercise to 3 times per week for 30 minutes each session.  Working out with the [employer] group or walking are great options.   Please review the information given on Advance Directives.  If you complete the paperwork, please bring a copy to our office to be filed in your medical record.   Please remember to go for your diabetic eye exam as soon as possible.  Please follow up with Evelina Dun, FNP as scheduled.  Thank you for coming in for your Annual Wellness Visit!!  I hope you have a Merry Christmas!!   Preventive Care 76 Years and Older, Female Preventive care refers to lifestyle choices and visits with your health care provider that can promote health and wellness. What does preventive care include?  A yearly physical exam. This is also called an annual well check.  Dental exams once or twice a year.  Routine eye exams. Ask your health care provider how often you should have your eyes checked.  Personal lifestyle choices, including: ? Daily care of your teeth and gums. ? Regular physical activity. ? Eating a healthy diet. ? Avoiding tobacco and drug use. ? Limiting alcohol use. ? Practicing safe sex. ? Taking low-dose aspirin every day. ? Taking vitamin and mineral supplements as recommended by your health care provider. What happens during an annual well check? The services and screenings done by your health care provider during your annual well check will depend on your age, overall health, lifestyle risk factors, and family history of disease. Counseling Your health care provider may ask you questions about your:  Alcohol use.  Tobacco use.  Drug use.  Emotional well-being.  Home and relationship well-being.  Sexual activity.  Eating habits.  History of falls.  Memory and ability to understand (cognition).  Work and work Statistician.  Reproductive health.  Screening You may have the following  tests or measurements:  Height, weight, and BMI.  Blood pressure.  Lipid and cholesterol levels. These may be checked every 5 years, or more frequently if you are over 36 years old.  Skin check.  Lung cancer screening. You may have this screening every year starting at age 76 if you have a 30-pack-year history of smoking and currently smoke or have quit within the past 15 years.  Colorectal cancer screening. All adults should have this screening starting at age 76 and continuing until age 69. You will have tests every 1-10 years, depending on your results and the type of screening test. People at increased risk should start screening at an earlier age. Screening tests may include: ? Guaiac-based fecal occult blood testing. ? Fecal immunochemical test (FIT). ? Stool DNA test. ? Virtual colonoscopy. ? Sigmoidoscopy. During this test, a flexible tube with a tiny camera (sigmoidoscope) is used to examine your rectum and lower colon. The sigmoidoscope is inserted through your anus into your rectum and lower colon. ? Colonoscopy. During this test, a long, thin, flexible tube with a tiny camera (colonoscope) is used to examine your entire colon and rectum.  Hepatitis C blood test.  Hepatitis B blood test.  Sexually transmitted disease (STD) testing.  Diabetes screening. This is done by checking your blood sugar (glucose) after you have not eaten for a while (fasting). You may have this done every 1-3 years.  Bone density scan. This is done to screen for osteoporosis. You may have this done starting at age 76.  Mammogram. This may be done every  1-2 years. Talk to your health care provider about how often you should have regular mammograms. Talk with your health care provider about your test results, treatment options, and if necessary, the need for more tests. Vaccines Your health care provider may recommend certain vaccines, such as:  Influenza vaccine. This is recommended every  year.  Tetanus, diphtheria, and acellular pertussis (Tdap, Td) vaccine. You may need a Td booster every 10 years.  Varicella vaccine. You may need this if you have not been vaccinated.  Zoster vaccine. You may need this after age 76.  Measles, mumps, and rubella (MMR) vaccine. You may need at least one dose of MMR if you were born in 1957 or later. You may also need a second dose.  Pneumococcal 13-valent conjugate (PCV13) vaccine. One dose is recommended after age 76.  Pneumococcal polysaccharide (PPSV23) vaccine. One dose is recommended after age 76.  Meningococcal vaccine. You may need this if you have certain conditions.  Hepatitis A vaccine. You may need this if you have certain conditions or if you travel or work in places where you may be exposed to hepatitis A.  Hepatitis B vaccine. You may need this if you have certain conditions or if you travel or work in places where you may be exposed to hepatitis B.  Haemophilus influenzae type b (Hib) vaccine. You may need this if you have certain conditions. Talk to your health care provider about which screenings and vaccines you need and how often you need them. This information is not intended to replace advice given to you by your health care provider. Make sure you discuss any questions you have with your health care provider. Document Released: 04/22/2015 Document Revised: 05/16/2017 Document Reviewed: 01/25/2015 Elsevier Interactive Patient Education  2019 [place].   Diabetes Mellitus and Nutrition, Adult When you have diabetes (diabetes mellitus), it is very important to have healthy eating habits because your blood sugar (glucose) levels are greatly affected by what you eat and drink. Eating healthy foods in the appropriate amounts, at about the same times every day, can help you:  Control your blood glucose.  Lower your risk of heart disease.  Improve your blood pressure.  Reach or maintain a healthy weight. Every  person with diabetes is different, and each person has different needs for a meal plan. Your health care provider may recommend that you work with a diet and nutrition specialist (dietitian) to make a meal plan that is best for you. Your meal plan may vary depending on factors such as:  The calories you need.  The medicines you take.  Your weight.  Your blood glucose, blood pressure, and cholesterol levels.  Your activity level.  Other health conditions you have, such as heart or kidney disease. How do carbohydrates affect me? Carbohydrates, also called carbs, affect your blood glucose level more than any other type of food. Eating carbs naturally raises the amount of glucose in your blood. Carb counting is a method for keeping track of how many carbs you eat. Counting carbs is important to keep your blood glucose at a healthy level, especially if you use insulin or take certain oral diabetes medicines. It is important to know how many carbs you can safely have in each meal. This is different for every person. Your dietitian can help you calculate how many carbs you should have at each meal and for each snack. Foods that contain carbs include:  Bread, cereal, rice, pasta, and crackers.  Potatoes and corn.  Peas, beans, and lentils.  Milk and yogurt.  Fruit and juice.  Desserts, such as cakes, cookies, ice cream, and candy. How does alcohol affect me? Alcohol can cause a sudden decrease in blood glucose (hypoglycemia), especially if you use insulin or take certain oral diabetes medicines. Hypoglycemia can be a life-threatening condition. Symptoms of hypoglycemia (sleepiness, dizziness, and confusion) are similar to symptoms of having too much alcohol. If your health care provider says that alcohol is safe for you, follow these guidelines:  Limit alcohol intake to no more than 1 drink per day for nonpregnant women and 2 drinks per day for men. One drink equals 12 oz of beer, 5 oz of  wine, or 1 oz of hard liquor.  Do not drink on an empty stomach.  Keep yourself hydrated with water, diet soda, or unsweetened iced tea.  Keep in mind that regular soda, juice, and other mixers may contain a lot of sugar and must be counted as carbs. What are tips for following this plan?  Reading food labels  Start by checking the serving size on the "Nutrition Facts" label of packaged foods and drinks. The amount of calories, carbs, fats, and other nutrients listed on the label is based on one serving of the item. Many items contain more than one serving per package.  Check the total grams (g) of carbs in one serving. You can calculate the number of servings of carbs in one serving by dividing the total carbs by 15. For example, if a food has 30 g of total carbs, it would be equal to 2 servings of carbs.  Check the number of grams (g) of saturated and trans fats in one serving. Choose foods that have low or no amount of these fats.  Check the number of milligrams (mg) of salt (sodium) in one serving. Most people should limit total sodium intake to less than 2,300 mg per day.  Always check the nutrition information of foods labeled as "low-fat" or "nonfat". These foods may be higher in added sugar or refined carbs and should be avoided.  Talk to your dietitian to identify your daily goals for nutrients listed on the label. Shopping  Avoid buying canned, premade, or processed foods. These foods tend to be high in fat, sodium, and added sugar.  Shop around the outside edge of the grocery store. This includes fresh fruits and vegetables, bulk grains, fresh meats, and fresh dairy. Cooking  Use low-heat cooking methods, such as baking, instead of high-heat cooking methods like deep frying.  Cook using healthy oils, such as olive, canola, or sunflower oil.  Avoid cooking with butter, cream, or high-fat meats. Meal planning  Eat meals and snacks regularly, preferably at the same times  every day. Avoid going long periods of time without eating.  Eat foods high in fiber, such as fresh fruits, vegetables, beans, and whole grains. Talk to your dietitian about how many servings of carbs you can eat at each meal.  Eat 4-6 ounces (oz) of lean protein each day, such as lean meat, chicken, fish, eggs, or tofu. One oz of lean protein is equal to: ? 1 oz of meat, chicken, or fish. ? 1 egg. ?  cup of tofu.  Eat some foods each day that contain healthy fats, such as avocado, nuts, seeds, and fish. Lifestyle  Check your blood glucose regularly.  Exercise regularly as told by your health care provider. This may include: ? 150 minutes of moderate-intensity or vigorous-intensity exercise each week.  This could be brisk walking, biking, or water aerobics. ? Stretching and doing strength exercises, such as yoga or weightlifting, at least 2 times a week.  Take medicines as told by your health care provider.  Do not use any products that contain nicotine or tobacco, such as cigarettes and e-cigarettes. If you need help quitting, ask your health care provider.  Work with a Social worker or diabetes educator to identify strategies to manage stress and any emotional and social challenges. Questions to ask a health care provider  Do I need to meet with a diabetes educator?  Do I need to meet with a dietitian?  What number can I call if I have questions?  When are the best times to check my blood glucose? Where to find more information:  American Diabetes Association: diabetes.org  Academy of Nutrition and Dietetics: www.eatright.[employer] of Diabetes and Digestive and Kidney Diseases (NIH): [employer] Summary  A healthy meal plan will help you control your blood glucose and maintain a healthy lifestyle.  Working with a diet and nutrition specialist (dietitian) can help you make a meal plan that is best for you.  Keep in mind that carbohydrates (carbs) and  alcohol have immediate effects on your blood glucose levels. It is important to count carbs and to use alcohol carefully. This information is not intended to replace advice given to you by your health care provider. Make sure you discuss any questions you have with your health care provider. Document Released: 12/21/2004 Document Revised: 10/24/2016 Document Reviewed: 04/30/2016 Elsevier Interactive Patient Education  2019 [employer].

## 2018-03-27 NOTE — Progress Notes (Addendum)
Subjective:   Rhonda Garcia is a 77 y.o. female who presents for Medicare Annual (Subsequent) preventive examination.  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  She had been widowed for 5 years, and remarried within the past year.  She lives at home with her husband.  She has 4 children, 4 grandchildren, 9 great grandchildren.  [name] feels her health has worsened slightly in the past year due problems to chronic bowel problems and arthritis pain.  She reports no hospitalizations or surgeries in the past year.  She did have one ER visit for pain related to sciatica.  Review of Systems:   All systems negative today   Cardiac Risk Factors include: advanced age (>62men, >64 women);diabetes mellitus;dyslipidemia;hypertension;obesity (BMI >30kg/m2);sedentary lifestyle     Objective:     Vitals: BP 139/70   Pulse 68   Ht 5\' 2"  (1.575 m)   Wt 200 lb (90.7 kg)   BMI 36.58 kg/m   Body mass index is 36.58 kg/m.  Advanced Directives 03/27/2018 12/28/2017 03/25/2017 03/21/2016 03/19/2016 03/14/2016 08/05/2015  Does Patient Have a Medical Advance Directive? No No No No No No No  Would patient like information on creating a medical advance directive? Yes (MAU/Ambulatory/Procedural Areas - Information given) No - Patient declined No - Patient declined No - Patient declined - No - Patient declined -  Pre-existing out of facility DNR order (yellow form or pink MOST form) - - - - - - -    Tobacco Social History   Tobacco Use  Smoking Status Never Smoker  Smokeless Tobacco Never Used     Counseling given: No  The tobacco section was revised today per patient.  She states she did not know why her chart said that she had been a smoker and quit in 1982.  She states she was never a smoker.     Clinical Intake:     Pain Score: 0-No pain                 Past Medical History:  Diagnosis Date  . A-fib ([place])   . Anal fissure    resolved   . Anemia   . Back pain    with left radiculopathy  . CAD (coronary artery disease)   . Cataract   . DDD (degenerative disc disease)   . Depression   . Diabetes mellitus   . Diverticulosis   . Dyslipidemia   . Dysrhythmia    AFib  . Esophagitis   . GERD (gastroesophageal reflux disease)   . Hiatal hernia   . Hx of peptic ulcer   . Hyperlipidemia   . Hypertension   . Hypothyroidism   . Internal hemorrhoids 2017  . NSTEMI (non-ST elevated myocardial infarction) ([place]) 08/05/2015  . Osteopenia   . Sleep apnea    not using CPAP now, could not tolerate and PCP aware.  . Thyroid disease    Past Surgical History:  Procedure Laterality Date  . ABDOMINAL HYSTERECTOMY Bilateral 2001 - approximate  . APPENDECTOMY    . BREAST REDUCTION SURGERY    . CARDIAC CATHETERIZATION N/A 08/08/2015   Procedure: Left Heart Cath and Coronary Angiography;  Surgeon: Burnell Blanks, MD;  Location: [facility];  Service: Cardiovascular;  Laterality: N/A;  . CHOLECYSTECTOMY  2008 - approximate  . COLONOSCOPY  06/2007   Friable anal canal and anal papillae. Pancolonic diverticula. Normal terminal ileum. Status post segmental biopsy, descending  colon biopsy showed minimal cryptitis. Biopsies felt to be  nonspecific but could be seen with NSAIDs. No features of inflammatory bowel disease. Stool studies were negative.  . COLONOSCOPY  03/21/2011   RMR: colonic polyps treated as described above, colonic diverticulosis (Tubular adenomas)  . COLONOSCOPY WITH PROPOFOL N/A 06/17/2014   RMR: Colonic diverticulosis. Multiple colonic polyps removed as described above.   . COLONOSCOPY WITH PROPOFOL N/A 03/19/2016   Procedure: COLONOSCOPY WITH PROPOFOL;  Surgeon: Daneil Dolin, MD;  [facility];  Service: Endoscopy;  Laterality: N/A;  8:45am  . CORONARY ANGIOPLASTY WITH STENT PLACEMENT     4 stents  . ESOPHAGEAL DILATION N/A 06/17/2014   Procedure: ESOPHAGEAL DILATION;  Surgeon: Daneil Dolin, MD;  Location: [facility];  Service: Endoscopy;  Laterality: N/A;  Maloney 55 Fr  . ESOPHAGOGASTRODUODENOSCOPY  06/2007   Small hiatal hernia  . ESOPHAGOGASTRODUODENOSCOPY  03/21/2011   RMR: normal esophagus- status post passage of Maloney dilator. Small hiatal hernia. Trivial antral and bulbar erosions  . ESOPHAGOGASTRODUODENOSCOPY (EGD) WITH PROPOFOL N/A 06/17/2014   RMR: Small hiatal hernia; otherwise normal EGD. Status post passage of a Maloney dilator. No explanation for patients right upper quadrant abdominal pain.   Marland Kitchen ESOPHAGOGASTRODUODENOSCOPY (EGD) WITH PROPOFOL N/A 03/19/2016   Procedure: ESOPHAGOGASTRODUODENOSCOPY (EGD) WITH PROPOFOL;  Surgeon: Daneil Dolin, MD;  [facility];  Service: Endoscopy;  Laterality: N/A;  . KNEE SURGERY Left    arthroscopy  . LIPOMA EXCISION  03/17/2012   Procedure: EXCISION LIPOMA;  Surgeon: Gwenyth Ober, MD;  Location: [facility];  Service: General;  Laterality: Right;  excision of right lower back lipoma  . MALONEY DILATION N/A 03/19/2016   Procedure: Venia Minks DILATION;  Surgeon: Daneil Dolin, MD;  [facility];  Service: Endoscopy;  Laterality: N/A;  . POLYPECTOMY  06/17/2014   Procedure: POLYPECTOMY;  Surgeon: Daneil Dolin, MD;  Location: [facility];  Service: Endoscopy;;   Family History  Problem Relation Age of Onset  . Heart attack Mother   . Hypertension Mother   . Stroke Mother        Deceased, age 74  . Diabetes Sister   . Diabetes Sister   . Cancer Sister        ovarian  . Stroke Sister   . Diabetes Brother        also has a blood disorder   . Clotting disorder Brother   . Kidney disease Brother   . Heart disease Brother   . Melanoma Brother        deceased  . Coronary artery disease Other   . Kidney disease Other   . Diabetes Son   . Hypertension Son   . Coronary artery disease Son 61       CABG  . Hypertension Son   . Coronary artery disease Son 9       CABG  . Colon cancer Neg  Hx    Social History   Socioeconomic History  . Marital status: Married    Spouse name: Not on file  . Number of children: 4  . Years of education: 6  . Highest education level: 6th grade  Occupational History  . Occupation: Retired    Comment: Charity fundraiser  Social Needs  . Financial resource strain: Somewhat hard  . Food insecurity:    Worry: Never true    Inability: Never true  . Transportation needs:    Medical: No  Non-medical: No  Tobacco Use  . Smoking status: Never Smoker  . Smokeless tobacco: Never Used  Substance and Sexual Activity  . Alcohol use: Not Currently    Alcohol/week: 1.0 standard drinks    Types: 1 Glasses of wine per week  . Drug use: No  . Sexual activity: Yes    Birth control/protection: Post-menopausal  Lifestyle  . Physical activity:    Days per week: 0 days    Minutes per session: 0 min  . Stress: Only a little  Relationships  . Social connections:    Talks on phone: More than three times a week    Gets together: More than three times a week    Attends religious service: More than 4 times per year    Active member of club or organization: Yes    Attends meetings of clubs or organizations: More than 4 times per year    Relationship status: Married  Other Topics Concern  . Not on file  Social History Narrative   Widowed but is remarrying in July 2019. Has 4 adult children. Lives in a house with fiance.     Outpatient Encounter Medications as of 03/27/2018  Medication Sig  . albuterol (PROVENTIL HFA;VENTOLIN HFA) 108 (90 Base) MCG/ACT inhaler Inhale 2 puffs into the lungs every 6 (six) hours as needed for wheezing or shortness of breath.  . ALPRAZolam (XANAX) 0.5 MG tablet Take 1 tablet (0.5 mg total) by mouth at bedtime as needed.  Marland Kitchen amLODipine (NORVASC) 10 MG tablet TAKE ONE (1) TABLET EACH DAY  . cephALEXin (KEFLEX) 500 MG capsule Take 1 capsule (500 mg total) by mouth 2 (two) times daily.  Marland Kitchen escitalopram (LEXAPRO) 10 MG tablet  Take 1 tablet (10 mg total) by mouth daily.  . isosorbide mononitrate (IMDUR) 60 MG 24 hr tablet Take 1 tablet (60 mg total) by mouth daily.  Marland Kitchen JANUVIA 100 MG tablet TAKE ONE (1) TABLET EACH DAY  . lisinopril-hydrochlorothiazide (PRINZIDE,ZESTORETIC) 20-12.5 MG tablet Take 2 tablets by mouth daily.  . metFORMIN (GLUCOPHAGE-XR) 500 MG 24 hr tablet TAKE 2 TABLETS BY MOUTH DAILY WITH BREAKFAST  . methocarbamol (ROBAXIN) 500 MG tablet Take 1 tablet (500 mg total) by mouth 3 (three) times daily.  . metoprolol tartrate (LOPRESSOR) 50 MG tablet TAKE ONE TABLET BY MOUTH TWICE DAILY  . nitroGLYCERIN (NITROSTAT) 0.4 MG SL tablet Place 1 tablet (0.4 mg total) under the tongue every 5 (five) minutes as needed for chest pain.  . pantoprazole (PROTONIX) 40 MG tablet TAKE ONE TABLET BY MOUTH TWICE DAILY  . pravastatin (PRAVACHOL) 80 MG tablet Take 1 tablet (80 mg total) by mouth every evening. NEED OV.  Marland Kitchen spironolactone (ALDACTONE) 25 MG tablet TAKE ONE (1) TABLET EACH DAY  . traMADol (ULTRAM) 50 MG tablet Take 1 tablet (50 mg total) by mouth every 12 (twelve) hours as needed.  . Vitamin D, Ergocalciferol, (DRISDOL) 50000 units CAPS capsule TAKE 1 CAPSULE EVERY 7 DAYS  . warfarin (COUMADIN) 5 MG tablet TAKE 1 TO 1&1/2 TABLET DAILY AS DIRECTED  . HYDROcodone-acetaminophen (NORCO/VICODIN) 5-325 MG tablet Take 1 tablet by mouth every 6 (six) hours as needed for severe pain. (Patient not taking: Reported on 03/27/2018)  . levothyroxine (SYNTHROID) 50 MCG tablet Take 1 tablet (50 mcg total) by mouth daily.  Marland Kitchen linaclotide (LINZESS) 145 MCG CAPS capsule Take 1 capsule (145 mcg total) by mouth daily before breakfast. (Patient not taking: Reported on 03/27/2018)   No facility-administered encounter medications on file as of  03/27/2018.     Activities of Daily Living In your present state of health, do you have any difficulty performing the following activities: 03/27/2018  Hearing? N  Vision? N  Difficulty  concentrating or making decisions? N  Walking or climbing stairs? N  Dressing or bathing? N  Doing errands, shopping? N  Preparing Food and eating ? N  Using the Toilet? N  In the past six months, have you accidently leaked urine? Y  Comment Urinary urgency, wears depends at night  Do you have problems with loss of bowel control? N  Managing your Medications? N  Managing your Finances? N  Housekeeping or managing your Housekeeping? N  Some recent data might be hidden    Patient Care Team: Sharion Balloon, FNP as PCP - General (Nurse Practitioner) Daneil Dolin, MD (Gastroenterology) Minus Breeding, MD as Consulting Physician (Cardiology) Raeanne Gathers, AUD as Audiologist (Audiology) Melina Schools, OD as Consulting Physician (Optometry)    Assessment:   This is a routine wellness examination for [place].  Exercise Activities and Dietary recommendations  Patient states she eats 3 meals per day.  Usually oatmeal or scrambled eggs and sausage for breakfast, small lunch such as peanut butter crackers, and vegetables for supper- mashed potatoes, corn, green beans.  Patient states she eats meat on occasion but does not eat it at every meal.  Advised her to try to get a protein source at each meal- examples given- chicken, [place], fish, eggs, beans, cottage cheese, greek yogurt, nuts, peanut butter. Advised her to eat plenty of non-starchy vegetables as well, and fruits and whole grains in moderation.   Current Exercise Habits: The patient does not participate in regular exercise at present, Exercise limited by: cardiac condition(s);orthopedic condition(s)  Goals    . Exercise 150 min/wk Moderate Activity     Increase physical activity to 150 min a week.    . Exercise 3x per week (30 min per time)     Exercising at the [facility] with [employer] classes or walking are great options.       Fall Risk Fall Risk  03/27/2018 03/20/2018 11/22/2017 08/20/2017 05/22/2017  Falls in the past  year? 0 0 No No No  Number falls in past yr: - - - - -  Comment - - - - -  Injury with Fall? - - - - -  Comment - - - - -  Risk Factor Category  - - - - -  Follow up - - - - -   Is the patient's home free of loose throw rugs in walkways, pet beds, electrical cords, etc?   yes      Grab bars in the bathroom? no      Handrails on the stairs?   no stairs in home      Adequate lighting?   yes    Depression Screen PHQ 2/9 Scores 03/27/2018 03/20/2018 11/22/2017 08/20/2017  PHQ - 2 Score 0 0 0 0  PHQ- 9 Score - - - -     Cognitive Function MMSE - Mini Mental State Exam 03/27/2018 03/25/2017 03/21/2016 03/21/2015  Orientation to time 5 5 5 3   Orientation to Place 5 5 5 5   Registration 3 3 3 3   Attention/ Calculation 2 2 2 2   Recall 3 2 2 3   Language- name 2 objects 2 2 2 2   Language- repeat 1 1 1 1   Language- follow 3 step command 3 3 3 3   Language- read & follow  direction 1 0 1 1  Write a sentence 1 1 1 1   Copy design 1 0 1 0  Total score 27 24 26 24         Immunization History  Administered Date(s) Administered  . Influenza, High Dose Seasonal PF 02/07/2015, 01/21/2017, 02/12/2018  . Influenza,inj,Quad PF,6+ Mos 01/19/2013, 05/20/2014, 01/10/2016  . Pneumococcal Conjugate-13 08/13/2013  . Pneumococcal Polysaccharide-23 02/07/2015  . Tdap 12/18/2012    Qualifies for Shingles Vaccine? Yes, declined today  Screening Tests Health Maintenance  Topic Date Due  . OPHTHALMOLOGY EXAM  05/17/2017  . HEMOGLOBIN A1C  05/25/2018  . FOOT EXAM  08/21/2018  . MAMMOGRAM  11/05/2018  . COLONOSCOPY  03/20/2019  . DEXA SCAN  03/25/2020  . TETANUS/TDAP  12/19/2022  . INFLUENZA VACCINE  Completed  . PNA vac Low Risk Adult  Completed    Cancer Screenings: Lung: Low Dose CT Chest recommended if Age 46-80 years, 30 pack-year currently smoking OR have quit w/in 15years. Patient does not qualify. Breast:  Up to date on Mammogram? Yes   Up to date of Bone Density/Dexa?  Yes Colorectal: up to date  Additional Screenings:  Hepatitis C Screening:  Not indicated     Plan:     Work on your goal of increasing your exercise to 3 times per week for 30 minutes each session.  Working out with the [employer] group or walking are great options.  Review the information given on Advance Directives.  If you complete the paperwork, please bring a copy to our office to be filed in your medical record.  Remember to go for your diabetic eye exam as soon as possible. Follow up with Evelina Dun, FNP as scheduled.    I have personally reviewed and noted the following in the patient's chart:   . Medical and social history . Use of alcohol, tobacco or illicit drugs  . Current medications and supplements . Functional ability and status . Nutritional status . Physical activity . Advanced directives . List of other physicians . Hospitalizations, surgeries, and ER visits in previous 12 months . Vitals . Screenings to include cognitive, depression, and falls . Referrals and appointments  In addition, I have reviewed and discussed with patient certain preventive protocols, quality metrics, and best practice recommendations. A written personalized care plan for preventive services as well as general preventive health recommendations were provided to patient.     Divante Kotch M, RN  03/27/2018  I have reviewed and agree with the above AWV documentation.   Evelina Dun, FNP

## 2018-04-04 ENCOUNTER — Other Ambulatory Visit: Payer: Self-pay | Admitting: Family

## 2018-04-04 ENCOUNTER — Other Ambulatory Visit: Payer: Self-pay | Admitting: Cardiology

## 2018-04-04 DIAGNOSIS — Z7901 Long term (current) use of anticoagulants: Secondary | ICD-10-CM

## 2018-04-04 DIAGNOSIS — E039 Hypothyroidism, unspecified: Secondary | ICD-10-CM

## 2018-04-05 ENCOUNTER — Other Ambulatory Visit: Payer: Self-pay | Admitting: Family

## 2018-04-05 ENCOUNTER — Other Ambulatory Visit: Payer: Self-pay | Admitting: Cardiology

## 2018-04-15 ENCOUNTER — Other Ambulatory Visit: Payer: Self-pay

## 2018-04-15 ENCOUNTER — Encounter (HOSPITAL_COMMUNITY): Payer: Self-pay | Admitting: Emergency Medicine

## 2018-04-15 ENCOUNTER — Emergency Department (HOSPITAL_COMMUNITY)
Admission: EM | Admit: 2018-04-15 | Discharge: 2018-04-15 | Disposition: A | Discharge status: Home / Self Care | Payer: Medicare Other | Attending: Emergency Medicine | Admitting: Emergency Medicine

## 2018-04-15 DIAGNOSIS — R040 Epistaxis: Secondary | ICD-10-CM | POA: Diagnosis not present

## 2018-04-15 DIAGNOSIS — Z9049 Acquired absence of other specified parts of digestive tract: Secondary | ICD-10-CM | POA: Insufficient documentation

## 2018-04-15 DIAGNOSIS — E039 Hypothyroidism, unspecified: Secondary | ICD-10-CM | POA: Insufficient documentation

## 2018-04-15 DIAGNOSIS — I252 Old myocardial infarction: Secondary | ICD-10-CM | POA: Insufficient documentation

## 2018-04-15 DIAGNOSIS — I2510 Atherosclerotic heart disease of native coronary artery without angina pectoris: Secondary | ICD-10-CM | POA: Diagnosis not present

## 2018-04-15 DIAGNOSIS — I10 Essential (primary) hypertension: Secondary | ICD-10-CM | POA: Diagnosis not present

## 2018-04-15 DIAGNOSIS — F329 Major depressive disorder, single episode, unspecified: Secondary | ICD-10-CM | POA: Diagnosis not present

## 2018-04-15 DIAGNOSIS — E119 Type 2 diabetes mellitus without complications: Secondary | ICD-10-CM | POA: Diagnosis not present

## 2018-04-15 LAB — PROTIME-INR
INR: 3.75
Prothrombin Time: 36.5 seconds — ABNORMAL HIGH (ref 11.4–15.2)

## 2018-04-15 LAB — CBC WITH DIFFERENTIAL/PLATELET
Abs Immature Granulocytes: 0.03 10*3/uL (ref 0.00–0.07)
Basophils Absolute: 0.1 10*3/uL (ref 0.0–0.1)
Basophils Relative: 1 %
Eosinophils Absolute: 0.2 10*3/uL (ref 0.0–0.5)
Eosinophils Relative: 3 %
HCT: 42.7 % (ref 36.0–46.0)
Hemoglobin: 13.6 g/dL (ref 12.0–15.0)
Immature Granulocytes: 1 %
Lymphocytes Relative: 18 %
Lymphs Abs: 1.2 10*3/uL (ref 0.7–4.0)
MCH: 29.4 pg (ref 26.0–34.0)
MCHC: 31.9 g/dL (ref 30.0–36.0)
MCV: 92.2 fL (ref 80.0–100.0)
Monocytes Absolute: 0.6 10*3/uL (ref 0.1–1.0)
Monocytes Relative: 9 %
Neutro Abs: 4.5 10*3/uL (ref 1.7–7.7)
Neutrophils Relative %: 68 %
Platelets: 151 10*3/uL (ref 150–400)
RBC: 4.63 MIL/uL (ref 3.87–5.11)
RDW: 14.1 % (ref 11.5–15.5)
WBC: 6.5 10*3/uL (ref 4.0–10.5)
nRBC: 0 % (ref 0.0–0.2)

## 2018-04-15 MED ORDER — OXYMETAZOLINE HCL 0.05 % NA SOLN
NASAL | Status: AC
  Filled 2018-04-15: qty 15

## 2018-04-15 MED ORDER — TRANEXAMIC ACID 1000 MG/10ML IV SOLN
500.0000 mg | Freq: Once | INTRAVENOUS | Status: DC
  Filled 2018-04-15: qty 10

## 2018-04-15 MED ORDER — "TRANEXAMIC ACID 5% ORAL SOLUTION "
10.0000 mL | Freq: Once | ORAL | Status: DC
  Filled 2018-04-15: qty 10

## 2018-04-15 NOTE — ED Notes (Signed)
TXA in room

## 2018-04-15 NOTE — Discharge Instructions (Addendum)
You were evaluated in the Emergency Department and after careful evaluation, we did not find any emergent condition requiring admission or further testing in the hospital.  Your symptoms today seem to be due to dry skin in the nose.  Please try the compression at home if your bleeding were to return as discussed.  If the bleeding is uncontrolled after a few hours despite compression, please return to the emergency department.  Please return to the Emergency Department if you experience any worsening of your condition.  We encourage you to follow up with a primary care provider.  Thank you for allowing [place] to be a part of your care.

## 2018-04-15 NOTE — ED Provider Notes (Signed)
[facility] Emergency Department Provider Note MRN:  [id number]  Arrival date & time: 04/15/18     Chief Complaint   Epistaxis   History of Present Illness   Rhonda Garcia is a 78 y.o. year-old female with a history of A. fib, diabetes presenting to the ED with chief complaint of epistaxis.  Intermittent bleeding from the left nare yesterday.  Bleeding began again today at 7:30 AM, has been constant since that time.  Denies trauma to the nose, no other symptoms.  Patient takes Coumadin.  Patient thinks that she has had some dryness in the nasal recently.  Had to have a cauterization procedure of the right nare in the past.  Review of Systems  A complete 10 system review of systems was obtained and all systems are negative except as noted in the HPI and PMH.   Patient's Health History    Past Medical History:  Diagnosis Date  . A-fib ([place])   . Anal fissure    resolved   . Anemia   . Back pain    with left radiculopathy  . CAD (coronary artery disease)   . Cataract   . DDD (degenerative disc disease)   . Depression   . Diabetes mellitus   . Diverticulosis   . Dyslipidemia   . Dysrhythmia    AFib  . Esophagitis   . GERD (gastroesophageal reflux disease)   . Hiatal hernia   . Hx of peptic ulcer   . Hyperlipidemia   . Hypertension   . Hypothyroidism   . Internal hemorrhoids 2017  . NSTEMI (non-ST elevated myocardial infarction) ([place]) 08/05/2015  . Osteopenia   . Sleep apnea    not using CPAP now, could not tolerate and PCP aware.  . Thyroid disease     Past Surgical History:  Procedure Laterality Date  . ABDOMINAL HYSTERECTOMY Bilateral 2001 - approximate  . APPENDECTOMY    . BREAST REDUCTION SURGERY    . CARDIAC CATHETERIZATION N/A 08/08/2015   Procedure: Left Heart Cath and Coronary Angiography;  Surgeon: Burnell Blanks, MD;  Location: [place] [facility];  Service: Cardiovascular;  Laterality: N/A;  . CHOLECYSTECTOMY  2008 - approximate    . COLONOSCOPY  06/2007   Friable anal canal and anal papillae. Pancolonic diverticula. Normal terminal ileum. Status post segmental biopsy, descending colon biopsy showed minimal cryptitis. Biopsies felt to be  nonspecific but could be seen with NSAIDs. No features of inflammatory bowel disease. Stool studies were negative.  . COLONOSCOPY  03/21/2011   RMR: colonic polyps treated as described above, colonic diverticulosis (Tubular adenomas)  . COLONOSCOPY WITH PROPOFOL N/A 06/17/2014   RMR: Colonic diverticulosis. Multiple colonic polyps removed as described above.   . COLONOSCOPY WITH PROPOFOL N/A 03/19/2016   Procedure: COLONOSCOPY WITH PROPOFOL;  Surgeon: Daneil Dolin, MD;  [facility];  Service: Endoscopy;  Laterality: N/A;  8:45am  . CORONARY ANGIOPLASTY WITH STENT PLACEMENT     4 stents  . ESOPHAGEAL DILATION N/A 06/17/2014   Procedure: ESOPHAGEAL DILATION;  Surgeon: Daneil Dolin, MD;  [facility]: [facility];  Service: Endoscopy;  Laterality: N/A;  Maloney 22 Fr  . ESOPHAGOGASTRODUODENOSCOPY  06/2007   Small hiatal hernia  . ESOPHAGOGASTRODUODENOSCOPY  03/21/2011   RMR: normal esophagus- status post passage of Maloney dilator. Small hiatal hernia. Trivial antral and bulbar erosions  . ESOPHAGOGASTRODUODENOSCOPY (EGD) WITH PROPOFOL N/A 06/17/2014   RMR: Small hiatal hernia; otherwise normal EGD. Status post passage of a Venia Minks  dilator. No explanation for patients right upper quadrant abdominal pain.   Marland Kitchen ESOPHAGOGASTRODUODENOSCOPY (EGD) WITH PROPOFOL N/A 03/19/2016   Procedure: ESOPHAGOGASTRODUODENOSCOPY (EGD) WITH PROPOFOL;  Surgeon: Daneil Dolin, MD;  [facility];  Service: Endoscopy;  Laterality: N/A;  . KNEE SURGERY Left    arthroscopy  . LIPOMA EXCISION  03/17/2012   Procedure: EXCISION LIPOMA;  Surgeon: Gwenyth Ober, MD;  Location: [place];  Service: General;  Laterality: Right;  excision of right lower back lipoma  . MALONEY DILATION N/A  03/19/2016   Procedure: Venia Minks DILATION;  Surgeon: Daneil Dolin, MD;  [facility];  Service: Endoscopy;  Laterality: N/A;  . POLYPECTOMY  06/17/2014   Procedure: POLYPECTOMY;  Surgeon: Daneil Dolin, MD;  [facility];  Service: Endoscopy;;    Family History  Problem Relation Age of Onset  . Heart attack Mother   . Hypertension Mother   . Stroke Mother        Deceased, age 74  . Diabetes Sister   . Diabetes Sister   . Cancer Sister        ovarian  . Stroke Sister   . Diabetes Brother        also has a blood disorder   . Clotting disorder Brother   . Kidney disease Brother   . Heart disease Brother   . Melanoma Brother        deceased  . Coronary artery disease Other   . Kidney disease Other   . Diabetes Son   . Hypertension Son   . Coronary artery disease Son 59       CABG  . Hypertension Son   . Coronary artery disease Son 85       CABG  . Colon cancer Neg Hx     Social History   Socioeconomic History  . Marital status: Married    Spouse name: Not on file  . Number of children: 4  . Years of education: 6  . Highest education level: 6th grade  Occupational History  . Occupation: Retired    Comment: Charity fundraiser  Social Needs  . Financial resource strain: Somewhat hard  . Food insecurity:    Worry: Never true    Inability: Never true  . Transportation needs:    Medical: No    Non-medical: No  Tobacco Use  . Smoking status: Never Smoker  . Smokeless tobacco: Never Used  Substance and Sexual Activity  . Alcohol use: Not Currently    Alcohol/week: 1.0 standard drinks    Types: 1 Glasses of wine per week  . Drug use: No  . Sexual activity: Yes    Birth control/protection: Post-menopausal  Lifestyle  . Physical activity:    Days per week: 0 days    Minutes per session: 0 min  . Stress: Only a little  Relationships  . Social connections:    Talks on phone: More than three times a week    Gets together: More than three times a week      Attends religious service: More than 4 times per year    Active member of club or organization: Yes    Attends meetings of clubs or organizations: More than 4 times per year    Relationship status: Married  . Intimate partner violence:    Fear of current or ex partner: No    Emotionally abused: No    Physically abused: No    Forced sexual activity:  No  Other Topics Concern  . Not on file  Social History Narrative   Widowed but is remarrying in July 2019. Has 4 adult children. Lives in a house with fiance.      Physical Exam  Vital Signs and Nursing Notes reviewed Vitals:   04/15/18 1500 04/15/18 1530  BP: 123/62 128/73  Pulse: 64 66  Resp: 12 11  Temp:    SpO2: 92% 98%    CONSTITUTIONAL: Well-appearing, NAD NEURO:  Alert and oriented x 3, no focal deficits EYES:  eyes equal and reactive ENT/NECK:  no LAD, no JVD; left nare packed with tissue, compression device on the nose CARDIO: Regular rate, well-perfused, normal S1 and S2 PULM:  CTAB no wheezing or rhonchi GI/GU:  normal bowel sounds, non-distended, non-tender MSK/SPINE:  No gross deformities, no edema SKIN:  no rash, atraumatic PSYCH:  Appropriate speech and behavior  Diagnostic and Interventional Summary    EKG Interpretation  Date/Time:    Ventricular Rate:    PR Interval:    QRS Duration:   QT Interval:    QTC Calculation:   R Axis:     Text Interpretation:        Labs Reviewed  PROTIME-INR - Abnormal; Notable for the following components:      Result Value   Prothrombin Time 36.5 (*)    All other components within normal limits  CBC WITH DIFFERENTIAL/PLATELET    No orders to display    Medications  oxymetazoline (AFRIN) 0.05 % nasal spray (has no administration in time range)  tranexamic acid (CYKLOKAPRON) injection 500 mg (500 mg Topical Given by Other 04/15/18 1530)     .Epistaxis Management Date/Time: 04/15/2018 4:22 PM Performed by: Maudie Flakes, MD Authorized by: Maudie Flakes, MD    Consent:    Consent obtained:  Verbal   Consent given by:  Patient   Risks discussed:  Bleeding Anesthesia (see MAR for exact dosages):    Anesthesia method:  None Procedure details:    Treatment site:  L anterior   Treatment complexity:  Limited   Treatment episode: initial   Post-procedure details:    Assessment:  Bleeding stopped   Patient tolerance of procedure:  Tolerated well, no immediate complications Comments:     Compression was applied with taped tongue depressors for 20 to 30 minutes, patient had return of bleeding after removal.  After that, patient blew her nose to remove all clots, Afrin was applied to the left nare, and compression was reapplied.  Upon reassessment 30 minutes later, bleeding had stopped.  30 more minutes of observation, no rebleeding.     Critical Care  ED Course and Medical Decision Making  I have reviewed the triage vital signs and the nursing notes.  Pertinent labs & imaging results that were available during my care of the patient were reviewed by me and considered in my medical decision making (see below for details).  Will reevaluate after 20 to 30 minutes of constant compression.  Bleeding resolved as described above.  INR therapeutic.  Return precautions, will follow-up with PCP.  After the discussed management above, the patient was determined to be safe for discharge.  The patient was in agreement with this plan and all questions regarding their care were answered.  ED return precautions were discussed and the patient will return to the ED with any significant worsening of condition.  Barth Kirks. Sedonia Small, [place] [email or web address]  Final Clinical Impressions(s) /  ED Diagnoses     ICD-10-CM   1. Epistaxis R04.0     ED Discharge Orders    None         Maudie Flakes, MD 04/15/18 1624

## 2018-04-15 NOTE — ED Provider Notes (Signed)
[facility] EMERGENCY DEPARTMENT Provider Note   CSN: [id number] Arrival date & time: 04/15/18  1355     History   Chief Complaint Chief Complaint  Patient presents with  . Epistaxis    HPI Rhonda Garcia is a 78 y.o. female.  HPI  Past Medical History:  Diagnosis Date  . A-fib ([place])   . Anal fissure    resolved   . Anemia   . Back pain    with left radiculopathy  . CAD (coronary artery disease)   . Cataract   . DDD (degenerative disc disease)   . Depression   . Diabetes mellitus   . Diverticulosis   . Dyslipidemia   . Dysrhythmia    AFib  . Esophagitis   . GERD (gastroesophageal reflux disease)   . Hiatal hernia   . Hx of peptic ulcer   . Hyperlipidemia   . Hypertension   . Hypothyroidism   . Internal hemorrhoids 2017  . NSTEMI (non-ST elevated myocardial infarction) ([place]) 08/05/2015  . Osteopenia   . Sleep apnea    not using CPAP now, could not tolerate and PCP aware.  . Thyroid disease     Patient Active Problem List   Diagnosis Date Noted  . Shortness of breath 01/09/2017  . Morbid obesity ([place]) 11/05/2016  . Vertigo 06/27/2016  . GAD (generalized anxiety disorder) 10/27/2015  . Coronary artery disease involving native coronary artery of native heart with unstable angina pectoris ([place])   . NSTEMI (non-ST elevated myocardial infarction) ([place]) 08/05/2015  . Hx of adenomatous colonic polyps 06/22/2015  . Hypothyroidism 01/17/2015  . Vitamin D deficiency 09/09/2014  . Hiatal hernia   . Dysphagia, pharyngoesophageal phase   . History of colonic polyps   . Diverticulosis of colon without hemorrhage   . Paroxysmal atrial fibrillation ([place]) 11/02/2013  . Long term current use of anticoagulant therapy 11/02/2013  . Depression 10/28/2012  . Type 2 diabetes mellitus with hyperglycemia ([place]) 10/28/2012  . Lipoma of back 02/12/2012  . Constipation 02/28/2011  . Esophageal dysphagia 02/28/2011  . Gastroesophageal reflux 02/28/2011  . POSITIONAL VERTIGO  04/29/2009  . Osteoarthritis 12/23/2008  . Hyperlipidemia associated with type 2 diabetes mellitus ([place]) 12/21/2008  . Hypertension associated with diabetes ([place]) 12/21/2008    Past Surgical History:  Procedure Laterality Date  . ABDOMINAL HYSTERECTOMY Bilateral 2001 - approximate  . APPENDECTOMY    . BREAST REDUCTION SURGERY    . CARDIAC CATHETERIZATION N/A 08/08/2015   Procedure: Left Heart Cath and Coronary Angiography;  Surgeon: Burnell Blanks, MD;  Location: [place] [facility];  Service: Cardiovascular;  Laterality: N/A;  . CHOLECYSTECTOMY  2008 - approximate  . COLONOSCOPY  06/2007   Friable anal canal and anal papillae. Pancolonic diverticula. Normal terminal ileum. Status post segmental biopsy, descending colon biopsy showed minimal cryptitis. Biopsies felt to be  nonspecific but could be seen with NSAIDs. No features of inflammatory bowel disease. Stool studies were negative.  . COLONOSCOPY  03/21/2011   RMR: colonic polyps treated as described above, colonic diverticulosis (Tubular adenomas)  . COLONOSCOPY WITH PROPOFOL N/A 06/17/2014   RMR: Colonic diverticulosis. Multiple colonic polyps removed as described above.   . COLONOSCOPY WITH PROPOFOL N/A 03/19/2016   Procedure: COLONOSCOPY WITH PROPOFOL;  Surgeon: Daneil Dolin, MD;  [facility];  Service: Endoscopy;  Laterality: N/A;  8:45am  . CORONARY ANGIOPLASTY WITH STENT PLACEMENT     4 stents  . ESOPHAGEAL DILATION N/A 06/17/2014   Procedure: ESOPHAGEAL  DILATION;  Surgeon: Daneil Dolin, MD;  [facility];  Service: Endoscopy;  Laterality: N/A;  Maloney 98 Fr  . ESOPHAGOGASTRODUODENOSCOPY  06/2007   Small hiatal hernia  . ESOPHAGOGASTRODUODENOSCOPY  03/21/2011   RMR: normal esophagus- status post passage of Maloney dilator. Small hiatal hernia. Trivial antral and bulbar erosions  . ESOPHAGOGASTRODUODENOSCOPY (EGD) WITH PROPOFOL N/A 06/17/2014   RMR: Small hiatal hernia; otherwise normal EGD. Status post  passage of a Maloney dilator. No explanation for patients right upper quadrant abdominal pain.   Marland Kitchen ESOPHAGOGASTRODUODENOSCOPY (EGD) WITH PROPOFOL N/A 03/19/2016   Procedure: ESOPHAGOGASTRODUODENOSCOPY (EGD) WITH PROPOFOL;  Surgeon: Daneil Dolin, MD;  [facility];  Service: Endoscopy;  Laterality: N/A;  . KNEE SURGERY Left    arthroscopy  . LIPOMA EXCISION  03/17/2012   Procedure: EXCISION LIPOMA;  Surgeon: Gwenyth Ober, MD;  Location: [place];  Service: General;  Laterality: Right;  excision of right lower back lipoma  . MALONEY DILATION N/A 03/19/2016   Procedure: Venia Minks DILATION;  Surgeon: Daneil Dolin, MD;  [facility];  Service: Endoscopy;  Laterality: N/A;  . POLYPECTOMY  06/17/2014   Procedure: POLYPECTOMY;  Surgeon: Daneil Dolin, MD;  [facility];  Service: Endoscopy;;     OB History   No obstetric history on file.      Home Medications    Prior to Admission medications   Medication Sig Start Date End Date Taking? Authorizing Provider  albuterol (PROVENTIL HFA;VENTOLIN HFA) 108 (90 Base) MCG/ACT inhaler Inhale 2 puffs into the lungs every 6 (six) hours as needed for wheezing or shortness of breath. 06/07/17   Janora Norlander, DO  ALPRAZolam Duanne Moron) 0.5 MG tablet Take 1 tablet (0.5 mg total) by mouth at bedtime as needed. 03/20/18   Evelina Dun A, FNP  amLODipine (NORVASC) 10 MG tablet TAKE ONE (1) TABLET EACH DAY 02/13/18   Hawks, Christy A, FNP  cephALEXin (KEFLEX) 500 MG capsule Take 1 capsule (500 mg total) by mouth 2 (two) times daily. 03/20/18   Evelina Dun A, FNP  escitalopram (LEXAPRO) 10 MG tablet Take 1 tablet (10 mg total) by mouth daily. 01/10/17   Sharion Balloon, FNP  HYDROcodone-acetaminophen (NORCO/VICODIN) 5-325 MG tablet Take 1 tablet by mouth every 6 (six) hours as needed for severe pain. Patient not taking: Reported on 03/27/2018 12/28/17   Evalee Jefferson, PA-C  isosorbide mononitrate (IMDUR) 60 MG 24 hr  tablet TAKE ONE (1) TABLET EACH DAY 04/07/18   Minus Breeding, MD  JANUVIA 100 MG tablet TAKE ONE (1) TABLET EACH DAY 04/04/18   Hawks, Theador Hawthorne, FNP  levothyroxine (SYNTHROID, LEVOTHROID) 50 MCG tablet TAKE ONE (1) TABLET EACH DAY 04/04/18   Sharion Balloon, FNP  linaclotide [facility]) 145 MCG CAPS capsule Take 1 capsule (145 mcg total) by mouth daily before breakfast. Patient not taking: Reported on 03/27/2018 03/20/18   Sharion Balloon, FNP  lisinopril-hydrochlorothiazide (PRINZIDE,ZESTORETIC) 20-12.5 MG tablet Take 2 tablets by mouth daily. 01/10/17   Sharion Balloon, FNP  metFORMIN (GLUCOPHAGE) 1000 MG tablet TAKE ONE TABLET TWICE A DAY WITH FOOD 04/07/18   Evelina Dun A, FNP  methocarbamol (ROBAXIN) 500 MG tablet Take 1 tablet (500 mg total) by mouth 3 (three) times daily. 12/28/17   Evalee Jefferson, PA-C  metoprolol tartrate (LOPRESSOR) 50 MG tablet TAKE ONE TABLET BY MOUTH TWICE DAILY 04/04/18   Evelina Dun A, FNP  nitroGLYCERIN (NITROSTAT) 0.4 MG SL tablet Place 1 tablet (0.4  mg total) under the tongue every 5 (five) minutes as needed for chest pain. 01/10/17   Evelina Dun A, FNP  pantoprazole (PROTONIX) 40 MG tablet TAKE ONE TABLET BY MOUTH TWICE DAILY 11/14/17   Evelina Dun A, FNP  pravastatin (PRAVACHOL) 80 MG tablet Take 1 tablet (80 mg total) by mouth every evening. NEED OV. 11/28/17   Minus Breeding, MD  spironolactone (ALDACTONE) 25 MG tablet TAKE ONE (1) TABLET EACH DAY 12/19/17   Hawks, Alyse Low A, FNP  traMADol (ULTRAM) 50 MG tablet Take 1 tablet (50 mg total) by mouth every 12 (twelve) hours as needed. 02/07/17   Sharion Balloon, FNP  Vitamin D, Ergocalciferol, (DRISDOL) 50000 units CAPS capsule TAKE 1 CAPSULE EVERY 7 DAYS 12/27/17   Evelina Dun A, FNP  warfarin (COUMADIN) 5 MG tablet TAKE 1 TO 1&1/2 TABLET DAILY AS DIRECTED 04/04/18   Sharion Balloon, FNP    Family History Family History  Problem Relation Age of Onset  . Heart attack Mother   . Hypertension Mother     . Stroke Mother        Deceased, age 42  . Diabetes Sister   . Diabetes Sister   . Cancer Sister        ovarian  . Stroke Sister   . Diabetes Brother        also has a blood disorder   . Clotting disorder Brother   . Kidney disease Brother   . Heart disease Brother   . Melanoma Brother        deceased  . Coronary artery disease Other   . Kidney disease Other   . Diabetes Son   . Hypertension Son   . Coronary artery disease Son 10       CABG  . Hypertension Son   . Coronary artery disease Son 97       CABG  . Colon cancer Neg Hx     Social History Social History   Tobacco Use  . Smoking status: Never Smoker  . Smokeless tobacco: Never Used  Substance Use Topics  . Alcohol use: Not Currently    Alcohol/week: 1.0 standard drinks    Types: 1 Glasses of wine per week  . Drug use: No     Allergies   Promethazine hcl   Review of Systems Review of Systems   Physical Exam Updated Vital Signs BP 136/73 (BP Location: Left Arm)   Pulse 76   Temp 97.8 F (36.6 C) (Oral)   Resp 16   Ht 5\' 2"  (1.575 m)   Wt 90 kg   SpO2 96%   BMI 36.29 kg/m   Physical Exam   ED Treatments / Results  Labs (all labs ordered are listed, but only abnormal results are displayed) Labs Reviewed - No data to display  EKG None  Radiology No results found.  Procedures Procedures (including critical care time)  Medications Ordered in ED Medications - No data to display   Initial Impression / Assessment and Plan / ED Course  I have reviewed the triage vital signs and the nursing notes.  Pertinent labs & imaging results that were available during my care of the patient were reviewed by me and considered in my medical decision making (see chart for details).     I had no encounter with this patient.  Please delete this note  Final Clinical Impressions(s) / ED Diagnoses   Final diagnoses:  None    ED Discharge Orders  None       Orlie Dakin,  MD 04/15/18 1520

## 2018-04-15 NOTE — ED Triage Notes (Addendum)
Nosebleed that started this morning from Lt side.  Pt takes warfarin

## 2018-04-17 ENCOUNTER — Emergency Department (HOSPITAL_COMMUNITY)
Admission: EM | Admit: 2018-04-17 | Discharge: 2018-04-17 | Disposition: A | Discharge status: Home / Self Care | Payer: Medicare Other | Attending: Emergency Medicine | Admitting: Emergency Medicine

## 2018-04-17 ENCOUNTER — Ambulatory Visit (INDEPENDENT_AMBULATORY_CARE_PROVIDER_SITE_OTHER): Payer: Medicare Other | Admitting: Family

## 2018-04-17 ENCOUNTER — Encounter: Payer: Self-pay | Admitting: Family

## 2018-04-17 ENCOUNTER — Other Ambulatory Visit: Payer: Self-pay

## 2018-04-17 ENCOUNTER — Encounter (HOSPITAL_COMMUNITY): Payer: Self-pay | Admitting: *Deleted

## 2018-04-17 VITALS — BP 126/70 | HR 96 | Temp 96.5°F | Ht 62.0 in | Wt 196.4 lb

## 2018-04-17 DIAGNOSIS — E119 Type 2 diabetes mellitus without complications: Secondary | ICD-10-CM | POA: Insufficient documentation

## 2018-04-17 DIAGNOSIS — I10 Essential (primary) hypertension: Secondary | ICD-10-CM | POA: Insufficient documentation

## 2018-04-17 DIAGNOSIS — Z7901 Long term (current) use of anticoagulants: Secondary | ICD-10-CM

## 2018-04-17 DIAGNOSIS — Z09 Encounter for follow-up examination after completed treatment for conditions other than malignant neoplasm: Secondary | ICD-10-CM | POA: Diagnosis not present

## 2018-04-17 DIAGNOSIS — I2510 Atherosclerotic heart disease of native coronary artery without angina pectoris: Secondary | ICD-10-CM | POA: Insufficient documentation

## 2018-04-17 DIAGNOSIS — I4891 Unspecified atrial fibrillation: Secondary | ICD-10-CM | POA: Diagnosis not present

## 2018-04-17 DIAGNOSIS — I152 Hypertension secondary to endocrine disorders: Secondary | ICD-10-CM

## 2018-04-17 DIAGNOSIS — Z79899 Other long term (current) drug therapy: Secondary | ICD-10-CM | POA: Diagnosis not present

## 2018-04-17 DIAGNOSIS — Z7984 Long term (current) use of oral hypoglycemic drugs: Secondary | ICD-10-CM | POA: Insufficient documentation

## 2018-04-17 DIAGNOSIS — J320 Chronic maxillary sinusitis: Secondary | ICD-10-CM | POA: Diagnosis not present

## 2018-04-17 DIAGNOSIS — R040 Epistaxis: Secondary | ICD-10-CM | POA: Diagnosis not present

## 2018-04-17 DIAGNOSIS — E785 Hyperlipidemia, unspecified: Secondary | ICD-10-CM

## 2018-04-17 DIAGNOSIS — I480 Paroxysmal atrial fibrillation: Secondary | ICD-10-CM

## 2018-04-17 DIAGNOSIS — E039 Hypothyroidism, unspecified: Secondary | ICD-10-CM | POA: Diagnosis not present

## 2018-04-17 DIAGNOSIS — E1159 Type 2 diabetes mellitus with other circulatory complications: Secondary | ICD-10-CM | POA: Diagnosis not present

## 2018-04-17 DIAGNOSIS — E1169 Type 2 diabetes mellitus with other specified complication: Secondary | ICD-10-CM

## 2018-04-17 DIAGNOSIS — J410 Simple chronic bronchitis: Secondary | ICD-10-CM | POA: Diagnosis not present

## 2018-04-17 LAB — BASIC METABOLIC PANEL
Anion gap: 5 (ref 5–15)
BUN: 24 mg/dL — ABNORMAL HIGH (ref 8–23)
CO2: 26 mmol/L (ref 22–32)
Calcium: 9.1 mg/dL (ref 8.9–10.3)
Chloride: 105 mmol/L (ref 98–111)
Creatinine, Ser: 0.85 mg/dL (ref 0.44–1.00)
GFR calc Af Amer: >60 mL/min (ref >60)
GFR calc non Af Amer: >60 mL/min (ref >60)
Glucose, Bld: 235 mg/dL — ABNORMAL HIGH (ref 70–99)
Potassium: 4 mmol/L (ref 3.5–5.1)
Sodium: 136 mmol/L (ref 135–145)

## 2018-04-17 LAB — CBC WITH DIFFERENTIAL/PLATELET
Abs Immature Granulocytes: 0.05 10*3/uL (ref 0.00–0.07)
Basophils Absolute: 0.1 10*3/uL (ref 0.0–0.1)
Basophils Relative: 1 %
Eosinophils Absolute: 0.2 10*3/uL (ref 0.0–0.5)
Eosinophils Relative: 2 %
HCT: 38.9 % (ref 36.0–46.0)
Hemoglobin: 12.2 g/dL (ref 12.0–15.0)
Immature Granulocytes: 1 %
Lymphocytes Relative: 23 %
Lymphs Abs: 2.3 10*3/uL (ref 0.7–4.0)
MCH: 29 pg (ref 26.0–34.0)
MCHC: 31.4 g/dL (ref 30.0–36.0)
MCV: 92.4 fL (ref 80.0–100.0)
Monocytes Absolute: 0.9 10*3/uL (ref 0.1–1.0)
Monocytes Relative: 9 %
Neutro Abs: 6.3 10*3/uL (ref 1.7–7.7)
Neutrophils Relative %: 64 %
Platelets: 168 10*3/uL (ref 150–400)
RBC: 4.21 MIL/uL (ref 3.87–5.11)
RDW: 14.1 % (ref 11.5–15.5)
WBC: 9.8 10*3/uL (ref 4.0–10.5)
nRBC: 0 % (ref 0.0–0.2)

## 2018-04-17 LAB — PROTIME-INR
INR: 2.8
Prothrombin Time: 29.1 seconds — ABNORMAL HIGH (ref 11.4–15.2)

## 2018-04-17 MED ORDER — PRAVASTATIN SODIUM 80 MG PO TABS: 80.0000 mg | ORAL_TABLET | Freq: Every evening | ORAL | 2 refills | Status: DC

## 2018-04-17 MED ORDER — LISINOPRIL-HYDROCHLOROTHIAZIDE 20-12.5 MG PO TABS: 2.0000 | ORAL_TABLET | Freq: Every day | ORAL | 6 refills | Status: DC

## 2018-04-17 MED ORDER — PHENYLEPHRINE HCL 1 % NA SOLN
NASAL | Status: AC
  Administered 2018-04-17: 2 [drp]
  Filled 2018-04-17: qty 15

## 2018-04-17 MED ORDER — AMLODIPINE BESYLATE 10 MG PO TABS: ORAL_TABLET | ORAL | 2 refills | Status: DC

## 2018-04-17 MED ORDER — SPIRONOLACTONE 25 MG PO TABS: ORAL_TABLET | ORAL | 2 refills | Status: DC

## 2018-04-17 NOTE — ED Triage Notes (Signed)
Pt states she has had a nose bleed x 3 days; pt states she was seen here last night for the same complaint

## 2018-04-17 NOTE — Progress Notes (Signed)
Subjective:    Patient ID: Rhonda Garcia, female    DOB: 07/02/1940, 78 y.o.   MRN: [id number]  Chief Complaint  Patient presents with  . Medical Management of Chronic Issues    refills needed  . Hospital visit recheck    needs ENT referral   Pt presents to the office today for hospital follow up. She has been seen  Twice in the ED over the last three days for epistaxis. She went last night and had her nose packed was told she could not remove the packing until she is seen by ENT.   She is on warfarin for A Fib, but her INR was 2.8. CBC stable. Hypertension  This is a chronic problem. The current episode started more than 1 year ago. The problem has been resolved since onset. The problem is controlled. Associated symptoms include malaise/fatigue. Pertinent negatives include no peripheral edema or shortness of breath. Risk factors for coronary artery disease include dyslipidemia, diabetes mellitus and obesity. The current treatment provides moderate improvement. Hypertensive end-organ damage includes kidney disease and CAD/MI.  Hyperlipidemia  This is a chronic problem. The current episode started more than 1 year ago. The problem is controlled. Recent lipid tests were reviewed and are normal. Exacerbating diseases include obesity. Pertinent negatives include no shortness of breath. The current treatment provides moderate improvement of lipids.    [facility] notes and labs reviewed  Review of Systems  Constitutional: Positive for malaise/fatigue.  Respiratory: Negative for shortness of breath.   All other systems reviewed and are negative.      Objective:   Physical Exam Vitals signs reviewed.  Constitutional:      General: She is not in acute distress.    Appearance: She is well-developed.  HENT:     Head: Normocephalic and atraumatic.     Right Ear: Tympanic membrane normal.     Left Ear: Tympanic membrane normal.     Nose:     Left Nostril: Epistaxis (packing intact)  present.  Eyes:     Pupils: Pupils are equal, round, and reactive to light.  Neck:     Musculoskeletal: Normal range of motion and neck supple.     Thyroid: No thyromegaly.  Cardiovascular:     Rate and Rhythm: Normal rate and regular rhythm.     Heart sounds: Normal heart sounds. No murmur.  Pulmonary:     Effort: Pulmonary effort is normal. No respiratory distress.     Breath sounds: Normal breath sounds. No wheezing.  Abdominal:     General: Bowel sounds are normal. There is no distension.     Palpations: Abdomen is soft.     Tenderness: There is no abdominal tenderness.  Musculoskeletal: Normal range of motion.        General: No tenderness.  Skin:    General: Skin is warm and dry.  Neurological:     Mental Status: She is alert and oriented to person, place, and time.     Cranial Nerves: No cranial nerve deficit.     Deep Tendon Reflexes: Reflexes are normal and symmetric.  Psychiatric:        Behavior: Behavior normal.        Thought Content: Thought content normal.        Judgment: Judgment normal.       BP 126/70   Pulse 96   Temp (!) 96.5 F (35.8 C) (Oral)   Ht 5\' 2"  (1.575 m)   Wt 196 lb 6.4 oz (  89.1 kg)   BMI 35.92 kg/m      Assessment & Plan:  Rhonda Garcia comes in today with chief complaint of Medical Management of Chronic Issues (refills needed) and Hospital visit recheck (needs ENT referral)   Diagnosis and orders addressed:  1. Essential hypertension - amLODipine (NORVASC) 10 MG tablet; TAKE ONE (1) TABLET EACH DAY  Dispense: 90 tablet; Refill: 2 - lisinopril-hydrochlorothiazide (PRINZIDE,ZESTORETIC) 20-12.5 MG tablet; Take 2 tablets by mouth daily.  Dispense: 180 tablet; Refill: 6 - spironolactone (ALDACTONE) 25 MG tablet; TAKE ONE (1) TABLET EACH DAY  Dispense: 90 tablet; Refill: 2  2. Epistaxis -Appt made for ENT today a 1 pm Avoid cough, blowing nose, pick nose Do not remove packing - Ambulatory referral to ENT  3. Hospital discharge  follow-up - Ambulatory referral to ENT  4. Hypertension associated with diabetes ([place]) - amLODipine (NORVASC) 10 MG tablet; TAKE ONE (1) TABLET EACH DAY  Dispense: 90 tablet; Refill: 2 - lisinopril-hydrochlorothiazide (PRINZIDE,ZESTORETIC) 20-12.5 MG tablet; Take 2 tablets by mouth daily.  Dispense: 180 tablet; Refill: 6 - spironolactone (ALDACTONE) 25 MG tablet; TAKE ONE (1) TABLET EACH DAY  Dispense: 90 tablet; Refill: 2  5. Paroxysmal atrial fibrillation (HCC)  6. Hyperlipidemia associated with type 2 diabetes mellitus (HCC) - pravastatin (PRAVACHOL) 80 MG tablet; Take 1 tablet (80 mg total) by mouth every evening. NEED OV.  Dispense: 90 tablet; Refill: 2  7. Long term current use of anticoagulant therapy - Ambulatory referral to ENT  8. Morbid obesity ([place])   Labs reviewed from this morning in ED Health Maintenance reviewed Diet and exercise encouraged  Follow up plan: 3 months and ENT appt today at 1 pm   Evelina Dun, FNP

## 2018-04-17 NOTE — Discharge Instructions (Addendum)
Follow-up with ENT in the next 2 days.  The contact information for Dr. Erik Obey has been provided in this discharge summary for you to call and make these arrangements.  If bleeding recurs, apply direct pressure by pinching your nostrils shut.

## 2018-04-17 NOTE — Patient Instructions (Signed)
Nosebleed, Adult  A nosebleed is when blood comes out of the nose. Nosebleeds are common. Usually, they are not a sign of a serious condition.  Nosebleeds can happen if a small blood vessel in your nose starts to bleed or if the lining of your nose (mucous membrane) cracks. They are commonly caused by:   Allergies.   Colds.   Picking your nose.   Blowing your nose too hard.   An injury from sticking an object into your nose or getting hit in the nose.   Dry or cold air.  Less common causes of nosebleeds include:   Toxic fumes.   Something abnormal in the nose or in the air-filled spaces in the bones of the face (sinuses).   Growths in the nose, such as polyps.   Medicines or conditions that cause blood to clot slowly.   Certain illnesses or procedures that irritate or dry out the nasal passages.  Follow these instructions at home:  When you have a nosebleed:     Sit down and tilt your head slightly forward.   Use a clean towel or tissue to pinch your nostrils under the bony part of your nose. After 10 minutes, let go of your nose and see if bleeding starts again. Do not release pressure before that time. If there is still bleeding, repeat the pinching and holding for 10 minutes until the bleeding stops.   Do not place tissues or gauze in the nose to stop bleeding.   Avoid lying down and avoid tilting your head backward. That may make blood collect in the throat and cause gagging or coughing.   Use a nasal spray decongestant to help with a nosebleed as told by your health care provider.   Do not use petroleum jelly or mineral oil in your nose. It can drip into your lungs.  After a nosebleed:   Avoid blowing your nose or sniffing for a number of hours.   Avoid straining, lifting, or bending at the waist for several days. You may resume other normal activities as you are able.   Use saline spray or a humidifier as told by your health care provider.   Aspirinand blood thinners make bleeding more  likely. If you are prescribed these medicines and you suffer from nosebleeds:  ? Ask your health care provider if you should stop taking the medicines or if you should adjust the dose.  ? Do not stop taking medicines that your health care provider has recommended unless told by your health care provider.   If your nosebleed was caused by dry mucous membranes, use over-the-counter saline nasal spray or gel. This will keep the mucous membranes moist and allow them to heal. If you must use a lubricant:  ? Choose one that is water-soluble.  ? Use only as much as you need and use it only as often as needed.  ? Do not lie down until several hours after you use it.  Contact a health care provider if:   You have a fever.   You get nosebleeds often or more often than usual.   You bruise very easily.   You have a nosebleed from having something stuck in your nose.   You have bleeding in your mouth.   You vomit or cough up brown material.   You have a nosebleed after you start a new medicine.  Get help right away if:   You have a nosebleed after a fall or a head injury.     Your nosebleed does not go away after 20 minutes.   You feel dizzy or weak.   You have unusual bleeding from other parts of your body.   You have unusual bruising on other parts of your body.   You become sweaty.   You vomit blood.  This information is not intended to replace advice given to you by your health care provider. Make sure you discuss any questions you have with your health care provider.  Document Released: 01/03/2005 Document Revised: 07/31/2016 Document Reviewed: 10/11/2015  Elsevier Interactive Patient Education  2019 [employer]

## 2018-04-17 NOTE — ED Provider Notes (Signed)
[facility] Provider Note   CSN: [id number] Arrival date & time: 04/17/18  0025     History   Chief Complaint Chief Complaint  Patient presents with  . Epistaxis    HPI Rhonda Garcia is a 78 y.o. female.  Patient is a 78 year old female with history of atrial fibrillation on Coumadin presenting with complaints of nosebleed.  This is been occurring intermittently for the past several days.  She was seen here yesterday for the same.  She had a packing temporarily placed which resolved the bleeding.  She went home, then the bleeding recurred this evening.  She denies any injury or trauma.  The history is provided by the patient.  Epistaxis  Location:  L nare Severity:  Moderate Timing:  Constant Progression:  Worsening Chronicity:  New Context: anticoagulants   Relieved by:  Nothing Worsened by:  Nothing   Past Medical History:  Diagnosis Date  . A-fib ([place])   . Anal fissure    resolved   . Anemia   . Back pain    with left radiculopathy  . CAD (coronary artery disease)   . Cataract   . DDD (degenerative disc disease)   . Depression   . Diabetes mellitus   . Diverticulosis   . Dyslipidemia   . Dysrhythmia    AFib  . Esophagitis   . GERD (gastroesophageal reflux disease)   . Hiatal hernia   . Hx of peptic ulcer   . Hyperlipidemia   . Hypertension   . Hypothyroidism   . Internal hemorrhoids 2017  . NSTEMI (non-ST elevated myocardial infarction) ([place]) 08/05/2015  . Osteopenia   . Sleep apnea    not using CPAP now, could not tolerate and PCP aware.  . Thyroid disease     Patient Active Problem List   Diagnosis Date Noted  . Shortness of breath 01/09/2017  . Morbid obesity ([place]) 11/05/2016  . Vertigo 06/27/2016  . GAD (generalized anxiety disorder) 10/27/2015  . Coronary artery disease involving native coronary artery of native heart with unstable angina pectoris ([place])   . NSTEMI (non-ST elevated myocardial infarction) ([place])  08/05/2015  . Hx of adenomatous colonic polyps 06/22/2015  . Hypothyroidism 01/17/2015  . Vitamin D deficiency 09/09/2014  . Hiatal hernia   . Dysphagia, pharyngoesophageal phase   . History of colonic polyps   . Diverticulosis of colon without hemorrhage   . Paroxysmal atrial fibrillation ([place]) 11/02/2013  . Long term current use of anticoagulant therapy 11/02/2013  . Depression 10/28/2012  . Type 2 diabetes mellitus with hyperglycemia ([place]) 10/28/2012  . Lipoma of back 02/12/2012  . Constipation 02/28/2011  . Esophageal dysphagia 02/28/2011  . Gastroesophageal reflux 02/28/2011  . POSITIONAL VERTIGO 04/29/2009  . Osteoarthritis 12/23/2008  . Hyperlipidemia associated with type 2 diabetes mellitus ([employer]) 12/21/2008  . Hypertension associated with diabetes ([place]) 12/21/2008    Past Surgical History:  Procedure Laterality Date  . ABDOMINAL HYSTERECTOMY Bilateral 2001 - approximate  . APPENDECTOMY    . BREAST REDUCTION SURGERY    . CARDIAC CATHETERIZATION N/A 08/08/2015   Procedure: Left Heart Cath and Coronary Angiography;  Surgeon: Burnell Blanks, MD;  Location: [facility];  Service: Cardiovascular;  Laterality: N/A;  . CHOLECYSTECTOMY  2008 - approximate  . COLONOSCOPY  06/2007   Friable anal canal and anal papillae. Pancolonic diverticula. Normal terminal ileum. Status post segmental biopsy, descending colon biopsy showed minimal cryptitis. Biopsies felt to be  nonspecific but could be seen with  NSAIDs. No features of inflammatory bowel disease. Stool studies were negative.  . COLONOSCOPY  03/21/2011   RMR: colonic polyps treated as described above, colonic diverticulosis (Tubular adenomas)  . COLONOSCOPY WITH PROPOFOL N/A 06/17/2014   RMR: Colonic diverticulosis. Multiple colonic polyps removed as described above.   . COLONOSCOPY WITH PROPOFOL N/A 03/19/2016   Procedure: COLONOSCOPY WITH PROPOFOL;  Surgeon: Daneil Dolin, MD;  [facility];  Service:  Endoscopy;  Laterality: N/A;  8:45am  . CORONARY ANGIOPLASTY WITH STENT PLACEMENT     4 stents  . ESOPHAGEAL DILATION N/A 06/17/2014   Procedure: ESOPHAGEAL DILATION;  Surgeon: Daneil Dolin, MD;  Location: [facility];  Service: Endoscopy;  Laterality: N/A;  Maloney 24 Fr  . ESOPHAGOGASTRODUODENOSCOPY  06/2007   Small hiatal hernia  . ESOPHAGOGASTRODUODENOSCOPY  03/21/2011   RMR: normal esophagus- status post passage of Maloney dilator. Small hiatal hernia. Trivial antral and bulbar erosions  . ESOPHAGOGASTRODUODENOSCOPY (EGD) WITH PROPOFOL N/A 06/17/2014   RMR: Small hiatal hernia; otherwise normal EGD. Status post passage of a Maloney dilator. No explanation for patients right upper quadrant abdominal pain.   Marland Kitchen ESOPHAGOGASTRODUODENOSCOPY (EGD) WITH PROPOFOL N/A 03/19/2016   Procedure: ESOPHAGOGASTRODUODENOSCOPY (EGD) WITH PROPOFOL;  Surgeon: Daneil Dolin, MD;  [facility];  Service: Endoscopy;  Laterality: N/A;  . KNEE SURGERY Left    arthroscopy  . LIPOMA EXCISION  03/17/2012   Procedure: EXCISION LIPOMA;  Surgeon: Gwenyth Ober, MD;  Location: [facility];  Service: General;  Laterality: Right;  excision of right lower back lipoma  . MALONEY DILATION N/A 03/19/2016   Procedure: Venia Minks DILATION;  Surgeon: Daneil Dolin, MD;  [facility];  Service: Endoscopy;  Laterality: N/A;  . POLYPECTOMY  06/17/2014   Procedure: POLYPECTOMY;  Surgeon: Daneil Dolin, MD;  Location: [facility];  Service: Endoscopy;;     OB History   No obstetric history on file.      Home Medications    Prior to Admission medications   Medication Sig Start Date End Date Taking? Authorizing Provider  albuterol (PROVENTIL HFA;VENTOLIN HFA) 108 (90 Base) MCG/ACT inhaler Inhale 2 puffs into the lungs every 6 (six) hours as needed for wheezing or shortness of breath. Patient not taking: Reported on 04/15/2018 06/07/17   Janora Norlander, DO  ALPRAZolam Duanne Moron) 0.5 MG tablet Take 1  tablet (0.5 mg total) by mouth at bedtime as needed. 03/20/18   Evelina Dun A, FNP  amLODipine (NORVASC) 10 MG tablet TAKE ONE (1) TABLET EACH DAY 02/13/18   Hawks, Alyse Low A, FNP  escitalopram (LEXAPRO) 10 MG tablet Take 1 tablet (10 mg total) by mouth daily. 01/10/17   Sharion Balloon, FNP  HYDROcodone-acetaminophen (NORCO/VICODIN) 5-325 MG tablet Take 1 tablet by mouth every 6 (six) hours as needed for severe pain. Patient not taking: Reported on 03/27/2018 12/28/17   Evalee Jefferson, PA-C  isosorbide mononitrate (IMDUR) 60 MG 24 hr tablet TAKE ONE (1) TABLET EACH DAY 04/07/18   Minus Breeding, MD  JANUVIA 100 MG tablet TAKE ONE (1) TABLET EACH DAY 04/04/18   Hawks, Theador Hawthorne, FNP  levothyroxine (SYNTHROID, LEVOTHROID) 50 MCG tablet TAKE ONE (1) TABLET EACH DAY 04/04/18   Sharion Balloon, FNP  linaclotide [facility]) 145 MCG CAPS capsule Take 1 capsule (145 mcg total) by mouth daily before breakfast. Patient not taking: Reported on 03/27/2018 03/20/18   Sharion Balloon, FNP  lisinopril-hydrochlorothiazide (PRINZIDE,ZESTORETIC) 20-12.5 MG tablet Take 2 tablets by mouth  daily. 01/10/17   Sharion Balloon, FNP  metFORMIN (GLUCOPHAGE) 1000 MG tablet TAKE ONE TABLET TWICE A DAY WITH FOOD 04/07/18   Evelina Dun A, FNP  methocarbamol (ROBAXIN) 500 MG tablet Take 1 tablet (500 mg total) by mouth 3 (three) times daily. Patient not taking: Reported on 04/15/2018 12/28/17   Evalee Jefferson, PA-C  metoprolol tartrate (LOPRESSOR) 50 MG tablet TAKE ONE TABLET BY MOUTH TWICE DAILY 04/04/18   Evelina Dun A, FNP  nitroGLYCERIN (NITROSTAT) 0.4 MG SL tablet Place 1 tablet (0.4 mg total) under the tongue every 5 (five) minutes as needed for chest pain. 01/10/17   Evelina Dun A, FNP  pantoprazole (PROTONIX) 40 MG tablet TAKE ONE TABLET BY MOUTH TWICE DAILY 11/14/17   Evelina Dun A, FNP  pravastatin (PRAVACHOL) 80 MG tablet Take 1 tablet (80 mg total) by mouth every evening. NEED OV. 11/28/17   Minus Breeding, MD    spironolactone (ALDACTONE) 25 MG tablet TAKE ONE (1) TABLET EACH DAY 12/19/17   Hawks, Alyse Low A, FNP  traMADol (ULTRAM) 50 MG tablet Take 1 tablet (50 mg total) by mouth every 12 (twelve) hours as needed. 02/07/17   Sharion Balloon, FNP  Vitamin D, Ergocalciferol, (DRISDOL) 50000 units CAPS capsule TAKE 1 CAPSULE EVERY 7 DAYS 12/27/17   Evelina Dun A, FNP  warfarin (COUMADIN) 5 MG tablet TAKE 1 TO 1&1/2 TABLET DAILY AS DIRECTED Patient taking differently: Take 5 mg by mouth daily.  04/04/18   Sharion Balloon, FNP    Family History Family History  Problem Relation Age of Onset  . Heart attack Mother   . Hypertension Mother   . Stroke Mother        Deceased, age 73  . Diabetes Sister   . Diabetes Sister   . Cancer Sister        ovarian  . Stroke Sister   . Diabetes Brother        also has a blood disorder   . Clotting disorder Brother   . Kidney disease Brother   . Heart disease Brother   . Melanoma Brother        deceased  . Coronary artery disease Other   . Kidney disease Other   . Diabetes Son   . Hypertension Son   . Coronary artery disease Son 96       CABG  . Hypertension Son   . Coronary artery disease Son 5       CABG  . Colon cancer Neg Hx     Social History Social History   Tobacco Use  . Smoking status: Never Smoker  . Smokeless tobacco: Never Used  Substance Use Topics  . Alcohol use: Not Currently    Alcohol/week: 1.0 standard drinks    Types: 1 Glasses of wine per week  . Drug use: No     Allergies   Promethazine hcl   Review of Systems Review of Systems  HENT: Positive for nosebleeds.   All other systems reviewed and are negative.    Physical Exam Updated Vital Signs BP 110/76 (BP Location: Right Arm)   Pulse 95   Temp (!) 97.5 F (36.4 C) (Oral)   Resp 16   Ht 5\' 2"  (1.575 m)   Wt 90 kg   SpO2 95%   BMI 36.29 kg/m   Physical Exam Vitals signs and nursing note reviewed.  Constitutional:      General: She is not in acute  distress.    Appearance: She  is well-developed. She is not diaphoretic.  HENT:     Head: Normocephalic and atraumatic.     Nose:     Comments: There is a slow trickle of blood from the left nares.  There is no obvious site of bleeding noted on exam. Neck:     Musculoskeletal: Normal range of motion and neck supple.  Cardiovascular:     Rate and Rhythm: Normal rate and regular rhythm.     Heart sounds: No murmur. No friction rub. No gallop.   Pulmonary:     Effort: Pulmonary effort is normal. No respiratory distress.     Breath sounds: Normal breath sounds. No wheezing.  Abdominal:     General: Bowel sounds are normal. There is no distension.     Palpations: Abdomen is soft.     Tenderness: There is no abdominal tenderness.  Musculoskeletal: Normal range of motion.  Skin:    General: Skin is warm and dry.  Neurological:     Mental Status: She is alert and oriented to person, place, and time.      ED Treatments / Results  Labs (all labs ordered are listed, but only abnormal results are displayed) Labs Reviewed - No data to display  EKG None  Radiology No results found.  Procedures Procedures (including critical care time)  Medications Ordered in ED Medications  phenylephrine (NEO-SYNEPHRINE) 1 % nasal drops (has no administration in time range)     Initial Impression / Assessment and Plan / ED Course  I have reviewed the triage vital signs and the nursing notes.  Pertinent labs & imaging results that were available during my care of the patient were reviewed by me and considered in my medical decision making (see chart for details).  Patient presenting here with complaints of nosebleed.  She takes Coumadin and INR today is 2.8.  Packing was placed using Vaseline gauze with good results.  She has been observed for several hours and is having no more bleeding.  She was also given Neo-Synephrine prior to packing application.  Laboratory studies show a hemoglobin of 12.2  and she is hemodynamically stable.  I feel as though she is appropriate for outpatient ENT follow-up.  Final Clinical Impressions(s) / ED Diagnoses   Final diagnoses:  None    ED Discharge Orders    None       Veryl Speak, MD 04/17/18 [phone]

## 2018-04-21 DIAGNOSIS — R040 Epistaxis: Secondary | ICD-10-CM | POA: Diagnosis not present

## 2018-04-23 ENCOUNTER — Encounter: Payer: Self-pay | Admitting: Pharmacist Clinician (PhC)/ Clinical Pharmacy Specialist

## 2018-04-23 DIAGNOSIS — R040 Epistaxis: Secondary | ICD-10-CM | POA: Diagnosis not present

## 2018-04-30 ENCOUNTER — Encounter: Payer: Self-pay | Admitting: Family Medicine

## 2018-04-30 ENCOUNTER — Ambulatory Visit (INDEPENDENT_AMBULATORY_CARE_PROVIDER_SITE_OTHER): Payer: Medicare Other | Admitting: Pharmacist Clinician (PhC)/ Clinical Pharmacy Specialist

## 2018-04-30 ENCOUNTER — Other Ambulatory Visit: Payer: Self-pay

## 2018-04-30 ENCOUNTER — Encounter (HOSPITAL_COMMUNITY): Payer: Self-pay | Admitting: *Deleted

## 2018-04-30 ENCOUNTER — Emergency Department (HOSPITAL_COMMUNITY)
Admission: EM | Admit: 2018-04-30 | Discharge: 2018-04-30 | Disposition: A | Discharge status: Home / Self Care | Payer: Medicare Other | Attending: Emergency Medicine | Admitting: Emergency Medicine

## 2018-04-30 ENCOUNTER — Ambulatory Visit (INDEPENDENT_AMBULATORY_CARE_PROVIDER_SITE_OTHER): Payer: Medicare Other | Admitting: Family Medicine

## 2018-04-30 VITALS — BP 126/65 | HR 66 | Temp 96.6°F | Resp 18 | Ht 62.0 in | Wt 192.0 lb

## 2018-04-30 DIAGNOSIS — Z7901 Long term (current) use of anticoagulants: Secondary | ICD-10-CM

## 2018-04-30 DIAGNOSIS — K922 Gastrointestinal hemorrhage, unspecified: Secondary | ICD-10-CM | POA: Diagnosis not present

## 2018-04-30 DIAGNOSIS — D649 Anemia, unspecified: Secondary | ICD-10-CM

## 2018-04-30 DIAGNOSIS — F419 Anxiety disorder, unspecified: Secondary | ICD-10-CM | POA: Diagnosis not present

## 2018-04-30 DIAGNOSIS — R42 Dizziness and giddiness: Secondary | ICD-10-CM

## 2018-04-30 DIAGNOSIS — Z79899 Other long term (current) drug therapy: Secondary | ICD-10-CM | POA: Diagnosis not present

## 2018-04-30 DIAGNOSIS — K219 Gastro-esophageal reflux disease without esophagitis: Secondary | ICD-10-CM | POA: Diagnosis not present

## 2018-04-30 DIAGNOSIS — Z5321 Procedure and treatment not carried out due to patient leaving prior to being seen by health care provider: Secondary | ICD-10-CM | POA: Insufficient documentation

## 2018-04-30 DIAGNOSIS — Z7984 Long term (current) use of oral hypoglycemic drugs: Secondary | ICD-10-CM | POA: Diagnosis not present

## 2018-04-30 DIAGNOSIS — R040 Epistaxis: Secondary | ICD-10-CM | POA: Diagnosis not present

## 2018-04-30 DIAGNOSIS — E785 Hyperlipidemia, unspecified: Secondary | ICD-10-CM | POA: Diagnosis not present

## 2018-04-30 DIAGNOSIS — R1013 Epigastric pain: Secondary | ICD-10-CM

## 2018-04-30 DIAGNOSIS — I10 Essential (primary) hypertension: Secondary | ICD-10-CM | POA: Diagnosis not present

## 2018-04-30 DIAGNOSIS — E119 Type 2 diabetes mellitus without complications: Secondary | ICD-10-CM | POA: Diagnosis not present

## 2018-04-30 DIAGNOSIS — K5730 Diverticulosis of large intestine without perforation or abscess without bleeding: Secondary | ICD-10-CM | POA: Diagnosis not present

## 2018-04-30 DIAGNOSIS — E1165 Type 2 diabetes mellitus with hyperglycemia: Secondary | ICD-10-CM

## 2018-04-30 DIAGNOSIS — Z87891 Personal history of nicotine dependence: Secondary | ICD-10-CM | POA: Diagnosis not present

## 2018-04-30 DIAGNOSIS — I480 Paroxysmal atrial fibrillation: Secondary | ICD-10-CM

## 2018-04-30 LAB — COMPREHENSIVE METABOLIC PANEL
ALT: 17 U/L (ref 0–44)
AST: 20 U/L (ref 15–41)
Albumin: 4 g/dL (ref 3.5–5.0)
Alkaline Phosphatase: 71 U/L (ref 38–126)
Anion gap: 9 (ref 5–15)
BUN: 26 mg/dL — ABNORMAL HIGH (ref 8–23)
CO2: 25 mmol/L (ref 22–32)
Calcium: 9 mg/dL (ref 8.9–10.3)
Chloride: 104 mmol/L (ref 98–111)
Creatinine, Ser: 1.14 mg/dL — ABNORMAL HIGH (ref 0.44–1.00)
GFR calc Af Amer: 54 mL/min — ABNORMAL LOW (ref >60)
GFR calc non Af Amer: 47 mL/min — ABNORMAL LOW (ref >60)
Glucose, Bld: 131 mg/dL — ABNORMAL HIGH (ref 70–99)
Potassium: 4 mmol/L (ref 3.5–5.1)
Sodium: 138 mmol/L (ref 135–145)
Total Bilirubin: 0.4 mg/dL (ref 0.3–1.2)
Total Protein: 7.5 g/dL (ref 6.5–8.1)

## 2018-04-30 LAB — CBC
HCT: 38.3 % (ref 36.0–46.0)
Hemoglobin: 11.9 g/dL — ABNORMAL LOW (ref 12.0–15.0)
MCH: 29 pg (ref 26.0–34.0)
MCHC: 31.1 g/dL (ref 30.0–36.0)
MCV: 93.4 fL (ref 80.0–100.0)
Platelets: 235 10*3/uL (ref 150–400)
RBC: 4.1 MIL/uL (ref 3.87–5.11)
RDW: 14.7 % (ref 11.5–15.5)
WBC: 8.9 10*3/uL (ref 4.0–10.5)
nRBC: 0 % (ref 0.0–0.2)

## 2018-04-30 LAB — COAGUCHEK XS/INR WAIVED
INR: 2.7 — ABNORMAL HIGH (ref 0.9–1.1)
Prothrombin Time: 32.2 s

## 2018-04-30 LAB — LIPASE, BLOOD: Lipase: 30 U/L (ref 11–51)

## 2018-04-30 LAB — HEMOGLOBIN, FINGERSTICK: Hemoglobin: 10.5 g/dL — ABNORMAL LOW (ref 11.1–15.9)

## 2018-04-30 LAB — GLUCOSE HEMOCUE WAIVED: Glu Hemocue Waived: 192 mg/dL — ABNORMAL HIGH (ref 65–99)

## 2018-04-30 NOTE — Progress Notes (Signed)
BP 126/65   Pulse 66   Temp (!) 96.6 F (35.9 C) (Oral)   Resp 18   Ht 5\' 2"  (1.575 m)   Wt 192 lb (87.1 kg)   BMI 35.12 kg/m    Subjective:    Patient ID: Rhonda Garcia, female    DOB: June 23, 1940, 78 y.o.   MRN: [id number]  HPI: Rhonda Garcia is a 78 y.o. female presenting on 04/30/2018 for Fatigue (x 2 weeks); Extremity Weakness; Anemia (Patient has nose bleed 1/9 and since then her hemaglobin has been going down.); Anorexia; Abdominal Pain; and Nausea   HPI Weakness and lightheadedness and nausea and upper abdominal pain Patient was in the emergency department on 04/17/2018 until 04/18/2018 with a nosebleed that she is been having for 2 days.  She is on Coumadin and had difficulty stopping the bleeding and when they finally did get it stopped her hemoglobin was around 12.,  Her biggest concern is that her abdominal pain Has been worse since this nosebleed and she is having a lot of nausea and lack of desire to eat.  Patient denies any vomiting or blood in her stool.  She denies having any further nose bleeding.  Her hemoglobin here in the office is 10.5 and her INR is 2.8.  She says she has been a little bit constipated she takes Linzess to have a bowel movement and then she does have a bowel movement when she takes it.  She does have some upper thoracic back pain as well but that has been going on even longer.  Patient also complains of a lot of belching and burping.  She is still taking her Protonix that she has had previously but does not feel like it is helping since she had a nosebleed a couple weeks ago.  Relevant past medical, surgical, family and social history reviewed and updated as indicated. Interim medical history since our last visit reviewed. Allergies and medications reviewed and updated.  Review of Systems  Constitutional: Negative for chills and fever.  Eyes: Negative for visual disturbance.  Respiratory: Negative for chest tightness and shortness of breath.     Cardiovascular: Negative for chest pain and leg swelling.  Gastrointestinal: Positive for abdominal pain (Epigastric), constipation and nausea. Negative for abdominal distention, blood in stool, diarrhea and vomiting.  Musculoskeletal: Negative for back pain and gait problem.  Skin: Negative for rash.  Neurological: Negative for light-headedness and headaches.  Psychiatric/Behavioral: Negative for agitation and behavioral problems.  All other systems reviewed and are negative.   Per HPI unless specifically indicated above   Allergies as of 04/30/2018      Reactions   Promethazine Hcl Anaphylaxis      Medication List       Accurate as of April 30, 2018 12:31 PM. Always use your most recent med list.        ALPRAZolam 0.5 MG tablet Commonly known as:  XANAX Take 1 tablet (0.5 mg total) by mouth at bedtime as needed.   amLODipine 10 MG tablet Commonly known as:  NORVASC TAKE ONE (1) TABLET EACH DAY   escitalopram 10 MG tablet Commonly known as:  LEXAPRO Take 1 tablet (10 mg total) by mouth daily.   isosorbide mononitrate 60 MG 24 hr tablet Commonly known as:  IMDUR TAKE ONE (1) TABLET EACH DAY   JANUVIA 100 MG tablet Generic drug:  sitaGLIPtin TAKE ONE (1) TABLET EACH DAY   levothyroxine 50 MCG tablet Commonly known as:  SYNTHROID, LEVOTHROID  TAKE ONE (1) TABLET EACH DAY   linaclotide 145 MCG Caps capsule Commonly known as:  LINZESS Take 1 capsule (145 mcg total) by mouth daily before breakfast.   lisinopril-hydrochlorothiazide 20-12.5 MG tablet Commonly known as:  PRINZIDE,ZESTORETIC Take 2 tablets by mouth daily.   metFORMIN 1000 MG tablet Commonly known as:  GLUCOPHAGE TAKE ONE TABLET TWICE A DAY WITH FOOD   methocarbamol 500 MG tablet Commonly known as:  ROBAXIN-750 Take 1 tablet (500 mg total) by mouth 3 (three) times daily.   metoprolol tartrate 50 MG tablet Commonly known as:  LOPRESSOR TAKE ONE TABLET BY MOUTH TWICE DAILY   nitroGLYCERIN 0.4  MG SL tablet Commonly known as:  NITROSTAT Place 1 tablet (0.4 mg total) under the tongue every 5 (five) minutes as needed for chest pain.   pantoprazole 40 MG tablet Commonly known as:  PROTONIX TAKE ONE TABLET BY MOUTH TWICE DAILY   pravastatin 80 MG tablet Commonly known as:  PRAVACHOL Take 1 tablet (80 mg total) by mouth every evening. NEED OV.   spironolactone 25 MG tablet Commonly known as:  ALDACTONE TAKE ONE (1) TABLET EACH DAY   traMADol 50 MG tablet Commonly known as:  ULTRAM Take 1 tablet (50 mg total) by mouth every 12 (twelve) hours as needed.   Vitamin D (Ergocalciferol) 1.25 MG (50000 UT) Caps capsule Commonly known as:  DRISDOL TAKE 1 CAPSULE EVERY 7 DAYS   warfarin 5 MG tablet Commonly known as:  COUMADIN Take as directed by the anticoagulation clinic. If you are unsure how to take this medication, talk to your nurse or doctor. Original instructions:  TAKE 1 TO 1&1/2 TABLET DAILY AS DIRECTED          Objective:    BP 126/65   Pulse 66   Temp (!) 96.6 F (35.9 C) (Oral)   Resp 18   Ht 5\' 2"  (1.575 m)   Wt 192 lb (87.1 kg)   BMI 35.12 kg/m   Wt Readings from Last 3 Encounters:  04/30/18 192 lb (87.1 kg)  04/17/18 196 lb 6.4 oz (89.1 kg)  04/17/18 198 lb 6.6 oz (90 kg)    Physical Exam Vitals signs and nursing note reviewed.  Constitutional:      General: She is not in acute distress.    Appearance: She is well-developed. She is not diaphoretic.  Eyes:     Conjunctiva/sclera: Conjunctivae normal.  Cardiovascular:     Rate and Rhythm: Normal rate and regular rhythm.     Heart sounds: Normal heart sounds. No murmur.  Pulmonary:     Effort: Pulmonary effort is normal. No respiratory distress.     Breath sounds: Normal breath sounds. No wheezing.  Abdominal:     General: Abdomen is flat. Bowel sounds are normal. There is no distension.     Tenderness: There is abdominal tenderness (Epigastric abdominal tenderness). There is no right CVA  tenderness, left CVA tenderness, guarding or rebound.  Musculoskeletal: Normal range of motion.        General: No tenderness.  Skin:    General: Skin is warm and dry.     Findings: No rash.  Neurological:     Mental Status: She is alert and oriented to person, place, and time.     Coordination: Coordination normal.  Psychiatric:        Behavior: Behavior normal.     Hemoglobin 10.5    Assessment & Plan:   Problem List Items Addressed This Visit  None    Visit Diagnoses    Symptomatic anemia    -  Primary   Epigastric abdominal pain       Lightheadedness       Chronic anticoagulation        With patient having lightheadedness and dizziness and her hemoglobin dropping down again to 10.5, there is some concern with her upper abdominal pain that she could be having a GI bleed.  She says she has not seen any dark tarry stools or any bright red blood in her stool.  She did have a nosebleed a week and a half ago and her hemoglobin then was 12 and today is 10.5 and she just is feeling nauseated and upper abdominal pain and lightheaded and dizzy and when she stands up quickly she feels faint.  We discussed all these symptoms and recommended her to go to the emergency department to make sure that she is not having any kind of continued GI bleed and then if they do not find that her hemoglobin is dipping or any continued GI bleed then she can follow-up with [place] in 2 days.  Follow up plan: Return in about 2 days (around 05/02/2018), or if symptoms worsen or fail to improve, for Symptomatic anemia follow-up.  Counseling provided for all of the vaccine components No orders of the defined types were placed in this encounter.   Caryl Pina, MD [facility] 04/30/2018, 12:31 PM

## 2018-04-30 NOTE — ED Triage Notes (Signed)
Pt reports she went to the doctor and told she had a low hemoglobin. Hemoglobin was 10 today, down from 12 recently. Pt reports she has been having nose bleeds lately. No bleeding at this time. Pt also reports dizziness, nausea, constipation, lower abdominal pain, decreased appetite.

## 2018-05-01 ENCOUNTER — Ambulatory Visit: Payer: Medicare Other | Admitting: Family

## 2018-05-02 ENCOUNTER — Other Ambulatory Visit: Payer: Medicare Other

## 2018-05-02 ENCOUNTER — Other Ambulatory Visit: Payer: Self-pay | Admitting: *Deleted

## 2018-05-02 ENCOUNTER — Ambulatory Visit: Payer: Medicare Other | Admitting: Family

## 2018-05-02 DIAGNOSIS — D649 Anemia, unspecified: Secondary | ICD-10-CM

## 2018-05-02 LAB — HEMOGLOBIN, FINGERSTICK: Hemoglobin: 10.7 g/dL — ABNORMAL LOW (ref 11.1–15.9)

## 2018-05-05 ENCOUNTER — Other Ambulatory Visit: Payer: Self-pay | Admitting: Family

## 2018-05-05 DIAGNOSIS — D649 Anemia, unspecified: Secondary | ICD-10-CM

## 2018-05-08 ENCOUNTER — Ambulatory Visit (INDEPENDENT_AMBULATORY_CARE_PROVIDER_SITE_OTHER): Payer: Medicare Other | Admitting: Family

## 2018-05-08 ENCOUNTER — Encounter: Payer: Self-pay | Admitting: Family

## 2018-05-08 VITALS — BP 102/51 | HR 58 | Temp 96.8°F | Ht 62.0 in | Wt 198.4 lb

## 2018-05-08 DIAGNOSIS — Z7901 Long term (current) use of anticoagulants: Secondary | ICD-10-CM

## 2018-05-08 DIAGNOSIS — E039 Hypothyroidism, unspecified: Secondary | ICD-10-CM

## 2018-05-08 DIAGNOSIS — R5383 Other fatigue: Secondary | ICD-10-CM

## 2018-05-08 DIAGNOSIS — R195 Other fecal abnormalities: Secondary | ICD-10-CM

## 2018-05-08 DIAGNOSIS — I480 Paroxysmal atrial fibrillation: Secondary | ICD-10-CM

## 2018-05-08 DIAGNOSIS — Z09 Encounter for follow-up examination after completed treatment for conditions other than malignant neoplasm: Secondary | ICD-10-CM | POA: Diagnosis not present

## 2018-05-08 DIAGNOSIS — D649 Anemia, unspecified: Secondary | ICD-10-CM | POA: Diagnosis not present

## 2018-05-08 NOTE — Patient Instructions (Signed)
Anemia  Anemia is a condition in which you do not have enough red blood cells or hemoglobin. Hemoglobin is a substance in red blood cells that carries oxygen. When you do not have enough red blood cells or hemoglobin (are anemic), your body cannot get enough oxygen and your organs may not work properly. As a result, you may feel very tired or have other problems. What are the causes? Common causes of anemia include:  Excessive bleeding. Anemia can be caused by excessive bleeding inside or outside the body, including bleeding from the intestine or from periods in women.  Poor nutrition.  Long-lasting (chronic) kidney, thyroid, and liver disease.  Bone marrow disorders.  Cancer and treatments for cancer.  HIV (human immunodeficiency virus) and AIDS (acquired immunodeficiency syndrome).  Treatments for HIV and AIDS.  Spleen problems.  Blood disorders.  Infections, medicines, and autoimmune disorders that destroy red blood cells. What are the signs or symptoms? Symptoms of this condition include:  Minor weakness.  Dizziness.  Headache.  Feeling heartbeats that are irregular or faster than normal (palpitations).  Shortness of breath, especially with exercise.  Paleness.  Cold sensitivity.  Indigestion.  Nausea.  Difficulty sleeping.  Difficulty concentrating. Symptoms may occur suddenly or develop slowly. If your anemia is mild, you may not have symptoms. How is this diagnosed? This condition is diagnosed based on:  Blood tests.  Your medical history.  A physical exam.  Bone marrow biopsy. Your health care provider may also check your stool (feces) for blood and may do additional testing to look for the cause of your bleeding. You may also have other tests, including:  Imaging tests, such as a CT scan or MRI.  Endoscopy.  Colonoscopy. How is this treated? Treatment for this condition depends on the cause. If you continue to lose a lot of blood, you may  need to be treated at a hospital. Treatment may include:  Taking supplements of iron, vitamin S31, or folic acid.  Taking a hormone medicine (erythropoietin) that can help to stimulate red blood cell growth.  Having a blood transfusion. This may be needed if you lose a lot of blood.  Making changes to your diet.  Having surgery to remove your spleen. Follow these instructions at home:  Take over-the-counter and prescription medicines only as told by your health care provider.  Take supplements only as told by your health care provider.  Follow any diet instructions that you were given.  Keep all follow-up visits as told by your health care provider. This is important. Contact a health care provider if:  You develop new bleeding anywhere in the body. Get help right away if:  You are very weak.  You are short of breath.  You have pain in your abdomen or chest.  You are dizzy or feel faint.  You have trouble concentrating.  You have bloody or black, tarry stools.  You vomit repeatedly or you vomit up blood. Summary  Anemia is a condition in which you do not have enough red blood cells or enough of a substance in your red blood cells that carries oxygen (hemoglobin).  Symptoms may occur suddenly or develop slowly.  If your anemia is mild, you may not have symptoms.  This condition is diagnosed with blood tests as well as a medical history and physical exam. Other tests may be needed.  Treatment for this condition depends on the cause of the anemia. This information is not intended to replace advice given to you by  your health care provider. Make sure you discuss any questions you have with your health care provider. Document Released: 05/03/2004 Document Revised: 04/27/2016 Document Reviewed: 04/27/2016 Elsevier Interactive Patient Education  2019 [employer].

## 2018-05-08 NOTE — Progress Notes (Signed)
Subjective:    Patient ID: Rhonda Garcia, female    DOB: 1940/07/31, 78 y.o.   MRN: [id number]  Chief Complaint  Patient presents with  . follow up [facility] visit    has    PT presents to the office today for hospital follow up. She was seen on 04/30/18 with anemia and light headedness. She was found to have hgb of 10.5 which was a drop from 12 a week prior.  She went to the ED and had CT scan that did not find any acute findings. Incidental finding of umbilical hernia containing only fat was noted. She did have a positive FOBT. She is taking warfarin for A Fib.  Dizziness  This is a recurrent problem. The current episode started more than 1 month ago. The problem occurs intermittently. The problem has been waxing and waning. Associated symptoms include coughing ("a little"), fatigue, nausea and weakness. Pertinent negatives include no congestion, sore throat or vomiting. Nothing aggravates the symptoms. She has tried rest for the symptoms. The treatment provided mild relief.      Review of Systems  Constitutional: Positive for fatigue.  HENT: Negative for congestion and sore throat.   Respiratory: Positive for cough ("a little").   Gastrointestinal: Positive for nausea. Negative for vomiting.  Neurological: Positive for dizziness and weakness.  All other systems reviewed and are negative.      Objective:   Physical Exam Vitals signs reviewed.  Constitutional:      General: She is not in acute distress.    Appearance: She is well-developed.  HENT:     Head: Normocephalic and atraumatic.  Eyes:     Pupils: Pupils are equal, round, and reactive to light.  Neck:     Musculoskeletal: Normal range of motion and neck supple.     Thyroid: No thyromegaly.  Cardiovascular:     Rate and Rhythm: Normal rate and regular rhythm.     Heart sounds: Normal heart sounds. No murmur.  Pulmonary:     Effort: Pulmonary effort is normal. No respiratory distress.     Breath sounds: Normal breath  sounds. No wheezing.  Abdominal:     General: Bowel sounds are normal. There is no distension.     Palpations: Abdomen is soft.     Tenderness: There is no abdominal tenderness.  Musculoskeletal: Normal range of motion.        General: No tenderness.  Skin:    General: Skin is warm and dry.  Neurological:     Mental Status: She is alert and oriented to person, place, and time.     Cranial Nerves: No cranial nerve deficit.     Deep Tendon Reflexes: Reflexes are normal and symmetric.  Psychiatric:        Behavior: Behavior normal.        Thought Content: Thought content normal.        Judgment: Judgment normal.     BP (!) 102/51   Pulse (!) 58   Temp (!) 96.8 F (36 C) (Oral)   Ht 5' 2"  (1.575 m)   Wt 198 lb 6.4 oz (90 kg)   BMI 36.29 kg/m      Assessment & Plan:  DANEL STUDZINSKI comes in today with chief complaint of follow up [facility] visit and Anemia (recheck)   Diagnosis and orders addressed:  1. Anemia, unspecified type - Anemia Profile B - CMP14+EGFR - Ambulatory referral to Gastroenterology  2. Hospital discharge follow-up - Anemia Profile B -  CMP14+EGFR - Ambulatory referral to Gastroenterology  3. Fatigue, unspecified type - Anemia Profile B - CMP14+EGFR - TSH  4. Paroxysmal atrial fibrillation (HCC)  5. Hypothyroidism, unspecified type  6. Long term current use of anticoagulant therapy  7. Positive fecal immunochemical test - Ambulatory referral to Gastroenterology  Wants Referral to GI for Dr. Gala Romney. Will place new referral.  Labs pending Continue medications  RTO in 1 months    Evelina Dun, FNP

## 2018-05-09 LAB — CMP14+EGFR
ALT: 13 IU/L (ref 0–32)
AST: 15 IU/L (ref 0–40)
Albumin/Globulin Ratio: 1.9 (ref 1.2–2.2)
Albumin: 4.1 g/dL (ref 3.7–4.7)
Alkaline Phosphatase: 77 IU/L (ref 39–117)
BUN/Creatinine Ratio: 17 (ref 12–28)
BUN: 16 mg/dL (ref 8–27)
Bilirubin Total: 0.5 mg/dL (ref 0.0–1.2)
CO2: 24 mmol/L (ref 20–29)
Calcium: 9.6 mg/dL (ref 8.7–10.3)
Chloride: 101 mmol/L (ref 96–106)
Creatinine, Ser: 0.96 mg/dL (ref 0.57–1.00)
GFR calc Af Amer: 66 mL/min/{1.73_m2} (ref >59)
GFR calc non Af Amer: 58 mL/min/{1.73_m2} — ABNORMAL LOW (ref >59)
Globulin, Total: 2.2 g/dL (ref 1.5–4.5)
Glucose: 130 mg/dL — ABNORMAL HIGH (ref 65–99)
Potassium: 4.5 mmol/L (ref 3.5–5.2)
Sodium: 139 mmol/L (ref 134–144)
Total Protein: 6.3 g/dL (ref 6.0–8.5)

## 2018-05-09 LAB — ANEMIA PROFILE B
Basophils Absolute: 0.1 10*3/uL (ref 0.0–0.2)
Basos: 1 %
EOS (ABSOLUTE): 0.2 10*3/uL (ref 0.0–0.4)
Eos: 2 %
Ferritin: 34 ng/mL (ref 15–150)
Folate: 8.2 ng/mL (ref >3.0)
Hematocrit: 35.2 % (ref 34.0–46.6)
Hemoglobin: 11.3 g/dL (ref 11.1–15.9)
Immature Grans (Abs): 0 10*3/uL (ref 0.0–0.1)
Immature Granulocytes: 0 %
Iron Saturation: 15 % (ref 15–55)
Iron: 45 ug/dL (ref 27–139)
Lymphocytes Absolute: 1.6 10*3/uL (ref 0.7–3.1)
Lymphs: 22 %
MCH: 30.1 pg (ref 26.6–33.0)
MCHC: 32.1 g/dL (ref 31.5–35.7)
MCV: 94 fL (ref 79–97)
Monocytes Absolute: 0.5 10*3/uL (ref 0.1–0.9)
Monocytes: 7 %
Neutrophils Absolute: 4.9 10*3/uL (ref 1.4–7.0)
Neutrophils: 68 %
Platelets: 179 10*3/uL (ref 150–450)
RBC: 3.76 x10E6/uL — ABNORMAL LOW (ref 3.77–5.28)
RDW: 14.2 % (ref 11.7–15.4)
Retic Ct Pct: 2.2 % (ref 0.6–2.6)
Total Iron Binding Capacity: 294 ug/dL (ref 250–450)
UIBC: 249 ug/dL (ref 118–369)
Vitamin B-12: 383 pg/mL (ref 232–1245)
WBC: 7.3 10*3/uL (ref 3.4–10.8)

## 2018-05-09 LAB — TSH: TSH: 1.31 u[IU]/mL (ref 0.450–4.500)

## 2018-05-12 ENCOUNTER — Encounter: Payer: Self-pay | Admitting: Internal Medicine

## 2018-05-16 ENCOUNTER — Ambulatory Visit (INDEPENDENT_AMBULATORY_CARE_PROVIDER_SITE_OTHER): Payer: Medicare Other | Admitting: Family

## 2018-05-16 ENCOUNTER — Encounter: Payer: Self-pay | Admitting: Family

## 2018-05-16 VITALS — BP 100/64 | HR 60 | Temp 96.7°F | Ht 62.0 in | Wt 196.8 lb

## 2018-05-16 DIAGNOSIS — I959 Hypotension, unspecified: Secondary | ICD-10-CM | POA: Diagnosis not present

## 2018-05-16 DIAGNOSIS — D649 Anemia, unspecified: Secondary | ICD-10-CM | POA: Diagnosis not present

## 2018-05-16 DIAGNOSIS — Z7901 Long term (current) use of anticoagulants: Secondary | ICD-10-CM

## 2018-05-16 DIAGNOSIS — I480 Paroxysmal atrial fibrillation: Secondary | ICD-10-CM

## 2018-05-16 DIAGNOSIS — R5383 Other fatigue: Secondary | ICD-10-CM

## 2018-05-16 LAB — COAGUCHEK XS/INR WAIVED
INR: 3.3 — ABNORMAL HIGH (ref 0.9–1.1)
Prothrombin Time: 39.6 s

## 2018-05-16 LAB — HEMOGLOBIN, FINGERSTICK: Hemoglobin: 12.2 g/dL (ref 11.1–15.9)

## 2018-05-16 MED ORDER — AMLODIPINE BESYLATE 5 MG PO TABS: 5.0000 mg | ORAL_TABLET | Freq: Every day | ORAL | 3 refills | Status: DC

## 2018-05-16 NOTE — Patient Instructions (Signed)

## 2018-05-16 NOTE — Progress Notes (Signed)
   Subjective:    Patient ID: Rhonda Garcia, female    DOB: [birth year], 78 y.o.   MRN: [id number]  Chief Complaint  Patient presents with  . anemic, discuss colonoscopy date for April   Pt presents to the office today with fatigue. She is scheduled for colonoscopy in April for a positive for FOBT.  Anemia  Presents for follow-up visit. Symptoms include malaise/fatigue. There has been no bruising/bleeding easily. Signs of blood loss that are not present include hematochezia, menorrhagia and vaginal bleeding.  A Fib Pt taking warfarin. See anticoagulation flow sheet.     Review of Systems  Constitutional: Positive for fatigue and malaise/fatigue.  Gastrointestinal: Negative for hematochezia.  Genitourinary: Negative for menorrhagia and vaginal bleeding.  Hematological: Does not bruise/bleed easily.  All other systems reviewed and are negative.      Objective:   Physical Exam Vitals signs reviewed.  Constitutional:      General: She is not in acute distress.    Appearance: She is well-developed. She is obese.  HENT:     Head: Normocephalic and atraumatic.  Neck:     Musculoskeletal: Normal range of motion and neck supple.     Thyroid: No thyromegaly.  Cardiovascular:     Rate and Rhythm: Normal rate and regular rhythm.     Heart sounds: Normal heart sounds. No murmur.  Pulmonary:     Effort: Pulmonary effort is normal. No respiratory distress.     Breath sounds: Normal breath sounds. No wheezing.  Abdominal:     General: Bowel sounds are normal. There is no distension.     Palpations: Abdomen is soft.     Tenderness: There is no abdominal tenderness.  Musculoskeletal: Normal range of motion.        General: No tenderness.  Skin:    General: Skin is warm and dry.  Neurological:     Mental Status: She is alert and oriented to person, place, and time.     Cranial Nerves: No cranial nerve deficit.     Deep Tendon Reflexes: Reflexes are normal and symmetric.  Psychiatric:         Behavior: Behavior normal.        Thought Content: Thought content normal.        Judgment: Judgment normal.       BP (!) 96/52   Pulse 60   Temp (!) 96.7 F (35.9 C) (Oral)   Ht 5\' 2"  (1.575 m)   Wt 196 lb 12.8 oz (89.3 kg)   BMI 36.00 kg/m      Assessment & Plan:  Rhonda Garcia comes in today with chief complaint of anemic, discuss colonoscopy date for April   Diagnosis and orders addressed:  1. Anemia, unspecified type Hgb stable today Keep Colonoscopy appt  - Hemoglobin, fingerstick  2. Hypotension, unspecified hypotension type Will decrease Norvasc to 5 mg from 10 mg daily Force fluids - Hemoglobin, fingerstick  3. Paroxysmal atrial fibrillation (HCC) - CoaguChek XS/INR Waived  4. Long term current use of anticoagulant therapy Description   INR today 3.3 (goal is 2-3)  Too thin  Hold today's dose (05/16/18), then decrease to 1/2 tablet (2.5 mg) Monday, Wednesday, and Friday. And 1 tablet (5 mg)  All other days.       5. Fatigue, unspecified type Rest Could be from deconditioning     Follow up plan: 2 weeks to recheck hypotension and INR   Evelina Dun, FNP

## 2018-05-30 ENCOUNTER — Ambulatory Visit (INDEPENDENT_AMBULATORY_CARE_PROVIDER_SITE_OTHER): Payer: Medicare Other | Admitting: Family

## 2018-05-30 ENCOUNTER — Encounter: Payer: Self-pay | Admitting: Family

## 2018-05-30 DIAGNOSIS — E1159 Type 2 diabetes mellitus with other circulatory complications: Secondary | ICD-10-CM

## 2018-05-30 DIAGNOSIS — I10 Essential (primary) hypertension: Secondary | ICD-10-CM

## 2018-05-30 DIAGNOSIS — I480 Paroxysmal atrial fibrillation: Secondary | ICD-10-CM

## 2018-05-30 DIAGNOSIS — M1990 Unspecified osteoarthritis, unspecified site: Secondary | ICD-10-CM

## 2018-05-30 DIAGNOSIS — Z7901 Long term (current) use of anticoagulants: Secondary | ICD-10-CM | POA: Diagnosis not present

## 2018-05-30 DIAGNOSIS — I152 Hypertension secondary to endocrine disorders: Secondary | ICD-10-CM

## 2018-05-30 LAB — COAGUCHEK XS/INR WAIVED
INR: 4.2 — ABNORMAL HIGH (ref 0.9–1.1)
Prothrombin Time: 50 s

## 2018-05-30 MED ORDER — TRAMADOL HCL 50 MG PO TABS: 50.0000 mg | ORAL_TABLET | Freq: Two times a day (BID) | ORAL | 5 refills | Status: DC | PRN

## 2018-05-30 NOTE — Patient Instructions (Signed)
Description   INR today 4.2 (goal is 2-3)  Too thin  Hold today's dose (05/30/18), then decrease to 2.5 mg (1/2 tablet) all other days.

## 2018-05-30 NOTE — Progress Notes (Signed)
Subjective:    Patient ID: Rhonda Garcia, female    DOB: 1940-10-24, 78 y.o.   MRN: [id number]  Chief Complaint  Patient presents with  . two week recheck hypotension and INR    PT presents to the office today to recheck hypotension and INR. We decreased her Norvasc to 5 mg from 10 mg. She states she continues to feel fatigue, nausea, and coldness.   PT takes warfarin for A Fib. Her INR is 4.2. See anticoagulation flow sheet.  Hip Pain   The pain is present in the left thigh. The quality of the pain is described as aching. The pain is at a severity of 8/10. The pain is moderate. The pain has been intermittent since onset.      Review of Systems  Constitutional: Positive for fatigue.  Gastrointestinal: Positive for nausea.  Musculoskeletal: Positive for arthralgias.  All other systems reviewed and are negative.      Objective:   Physical Exam Vitals signs reviewed.  Constitutional:      General: She is not in acute distress.    Appearance: She is well-developed.  HENT:     Head: Normocephalic and atraumatic.     Right Ear: Tympanic membrane normal.     Left Ear: Tympanic membrane normal.  Eyes:     Pupils: Pupils are equal, round, and reactive to light.  Neck:     Musculoskeletal: Normal range of motion and neck supple.     Thyroid: No thyromegaly.  Cardiovascular:     Rate and Rhythm: Normal rate and regular rhythm.     Heart sounds: Normal heart sounds. No murmur.  Pulmonary:     Effort: Pulmonary effort is normal. No respiratory distress.     Breath sounds: Normal breath sounds. No wheezing.  Abdominal:     General: Bowel sounds are normal. There is no distension.     Palpations: Abdomen is soft.     Tenderness: There is no abdominal tenderness.  Musculoskeletal:        General: No tenderness.     Comments: Mild pain in left hip with rotation  Skin:    General: Skin is warm and dry.  Neurological:     Mental Status: She is alert and oriented to person,  place, and time.     Cranial Nerves: No cranial nerve deficit.     Deep Tendon Reflexes: Reflexes are normal and symmetric.  Psychiatric:        Behavior: Behavior normal.        Thought Content: Thought content normal.        Judgment: Judgment normal.       BP (!) 104/58   Pulse 61   Temp (!) 96.5 F (35.8 C) (Oral)   Ht 5' 2"  (1.575 m)   Wt 196 lb (88.9 kg)   BMI 35.85 kg/m      Assessment & Plan:  Rhonda Garcia comes in today with chief complaint of two week recheck hypotension and INR   Diagnosis and orders addressed:  1. Paroxysmal atrial fibrillation (HCC) - CoaguChek XS/INR Waived - CMP14+EGFR - CBC with Differential/Platelet  2. Long term current use of anticoagulant therapy Description   INR today 4.2 (goal is 2-3)  Too thin  Hold today's dose (05/30/18), then decrease to 2.5 mg (1/2 tablet) all other days.       RTO in 2 weeks to recheck - CoaguChek XS/INR Waived - CMP14+EGFR - CBC with Differential/Platelet  3. Hypertension associated  with diabetes ([place]) - CMP14+EGFR - CBC with Differential/Platelet  4. Morbid obesity ([place]) - CMP14+EGFR - CBC with Differential/Platelet  5. Osteoarthritis, unspecified osteoarthritis type, unspecified site PT reviewed in [place] controlled database, no red flags noted - traMADol (ULTRAM) 50 MG tablet; Take 1 tablet (50 mg total) by mouth every 12 (twelve) hours as needed.  Dispense: 60 tablet; Refill: 5   Labs pending Diet and exercise encouraged  Follow up plan: 2 weeks to recheck INR   Evelina Dun, FNP

## 2018-05-31 LAB — CMP14+EGFR
ALT: 9 IU/L (ref 0–32)
AST: 17 IU/L (ref 0–40)
Albumin/Globulin Ratio: 1.6 (ref 1.2–2.2)
Albumin: 3.9 g/dL (ref 3.7–4.7)
Alkaline Phosphatase: 83 IU/L (ref 39–117)
BUN/Creatinine Ratio: 21 (ref 12–28)
BUN: 23 mg/dL (ref 8–27)
Bilirubin Total: 0.5 mg/dL (ref 0.0–1.2)
CO2: 22 mmol/L (ref 20–29)
Calcium: 9.3 mg/dL (ref 8.7–10.3)
Chloride: 103 mmol/L (ref 96–106)
Creatinine, Ser: 1.07 mg/dL — ABNORMAL HIGH (ref 0.57–1.00)
GFR calc Af Amer: 58 mL/min/{1.73_m2} — ABNORMAL LOW (ref >59)
GFR calc non Af Amer: 51 mL/min/{1.73_m2} — ABNORMAL LOW (ref >59)
Globulin, Total: 2.4 g/dL (ref 1.5–4.5)
Glucose: 138 mg/dL — ABNORMAL HIGH (ref 65–99)
Potassium: 4.7 mmol/L (ref 3.5–5.2)
Sodium: 139 mmol/L (ref 134–144)
Total Protein: 6.3 g/dL (ref 6.0–8.5)

## 2018-05-31 LAB — CBC WITH DIFFERENTIAL/PLATELET
Basophils Absolute: 0.1 10*3/uL (ref 0.0–0.2)
Basos: 1 %
EOS (ABSOLUTE): 0.2 10*3/uL (ref 0.0–0.4)
Eos: 3 %
Hematocrit: 35.1 % (ref 34.0–46.6)
Hemoglobin: 11.7 g/dL (ref 11.1–15.9)
Immature Grans (Abs): 0 10*3/uL (ref 0.0–0.1)
Immature Granulocytes: 0 %
Lymphocytes Absolute: 1.5 10*3/uL (ref 0.7–3.1)
Lymphs: 23 %
MCH: 30.2 pg (ref 26.6–33.0)
MCHC: 33.3 g/dL (ref 31.5–35.7)
MCV: 91 fL (ref 79–97)
Monocytes Absolute: 0.5 10*3/uL (ref 0.1–0.9)
Monocytes: 8 %
Neutrophils Absolute: 4.1 10*3/uL (ref 1.4–7.0)
Neutrophils: 65 %
Platelets: 157 10*3/uL (ref 150–450)
RBC: 3.88 x10E6/uL (ref 3.77–5.28)
RDW: 13.8 % (ref 11.7–15.4)
WBC: 6.4 10*3/uL (ref 3.4–10.8)

## 2018-06-12 ENCOUNTER — Ambulatory Visit (INDEPENDENT_AMBULATORY_CARE_PROVIDER_SITE_OTHER): Payer: Medicare Other | Admitting: Family Medicine

## 2018-06-12 ENCOUNTER — Encounter: Payer: Self-pay | Admitting: Family Medicine

## 2018-06-12 VITALS — BP 105/66 | HR 77 | Temp 97.8°F | Ht 62.0 in | Wt 193.0 lb

## 2018-06-12 DIAGNOSIS — I480 Paroxysmal atrial fibrillation: Secondary | ICD-10-CM | POA: Diagnosis not present

## 2018-06-12 DIAGNOSIS — J0100 Acute maxillary sinusitis, unspecified: Secondary | ICD-10-CM | POA: Diagnosis not present

## 2018-06-12 DIAGNOSIS — Z7901 Long term (current) use of anticoagulants: Secondary | ICD-10-CM | POA: Diagnosis not present

## 2018-06-12 LAB — POCT INR: INR: 1.7 — AB (ref 2.0–3.0)

## 2018-06-12 LAB — COAGUCHEK XS/INR WAIVED
INR: 1.7 — ABNORMAL HIGH (ref 0.9–1.1)
Prothrombin Time: 20.3 s

## 2018-06-12 MED ORDER — AMOXICILLIN-POT CLAVULANATE 875-125 MG PO TABS: 1.0000 | ORAL_TABLET | Freq: Two times a day (BID) | ORAL | 0 refills | Status: AC

## 2018-06-12 MED ORDER — FLUTICASONE PROPIONATE 50 MCG/ACT NA SUSP: 2.0000 | Freq: Every day | NASAL | 6 refills | Status: DC

## 2018-06-12 NOTE — Progress Notes (Signed)
Subjective:  Patient ID: Rhonda Garcia, female    DOB: [birth year], 78 y.o.   MRN: [id number]  Chief Complaint:  URI (sinus pressure and pain, headache, cough, sneezing, SOB)   HPI: Rhonda Garcia is a 78 y.o. female presenting on 06/12/2018 for URI (sinus pressure and pain, headache, cough, sneezing, SOB)  Pt presents today with complaints of fever, chills, cough, sinus pressure, headache, and SHOB with coughing. She denies chest pain, persistent SHOB, diaphoresis, chest tightness, jaw pain, arm pain, or nausea. States this all started 4 days ago and is getting worse. States she has been taking Mucinex with some relief of her symptoms. She states her headache if frontal, pressure like, and a 6/10.   She is also due for her INR check. States it is due tomorrow, but her husband is in the hospital and she will not be able to come back tomorrow. She denies bruising or abnormal bleeding.   Relevant past medical, surgical, family, and social history reviewed and updated as indicated.  Allergies and medications reviewed and updated.   Past Medical History:  Diagnosis Date  . A-fib ([place])   . Anal fissure    resolved   . Anemia   . Back pain    with left radiculopathy  . CAD (coronary artery disease)   . Cataract   . DDD (degenerative disc disease)   . Depression   . Diabetes mellitus   . Diverticulosis   . Dyslipidemia   . Dysrhythmia    AFib  . Esophagitis   . GERD (gastroesophageal reflux disease)   . Hiatal hernia   . Hx of peptic ulcer   . Hyperlipidemia   . Hypertension   . Hypothyroidism   . Internal hemorrhoids 2017  . NSTEMI (non-ST elevated myocardial infarction) ([place]) 08/05/2015  . Osteopenia   . Sleep apnea    not using CPAP now, could not tolerate and PCP aware.  . Thyroid disease     Past Surgical History:  Procedure Laterality Date  . ABDOMINAL HYSTERECTOMY Bilateral 2001 - approximate  . APPENDECTOMY    . BREAST REDUCTION SURGERY    . CARDIAC  CATHETERIZATION N/A 08/08/2015   Procedure: Left Heart Cath and Coronary Angiography;  Surgeon: Burnell Blanks, MD;  Location: [facility];  Service: Cardiovascular;  Laterality: N/A;  . CHOLECYSTECTOMY  2008 - approximate  . COLONOSCOPY  06/2007   Friable anal canal and anal papillae. Pancolonic diverticula. Normal terminal ileum. Status post segmental biopsy, descending colon biopsy showed minimal cryptitis. Biopsies felt to be  nonspecific but could be seen with NSAIDs. No features of inflammatory bowel disease. Stool studies were negative.  . COLONOSCOPY  03/21/2011   RMR: colonic polyps treated as described above, colonic diverticulosis (Tubular adenomas)  . COLONOSCOPY WITH PROPOFOL N/A 06/17/2014   RMR: Colonic diverticulosis. Multiple colonic polyps removed as described above.   . COLONOSCOPY WITH PROPOFOL N/A 03/19/2016   Procedure: COLONOSCOPY WITH PROPOFOL;  Surgeon: Daneil Dolin, MD;  [facility];  Service: Endoscopy;  Laterality: N/A;  8:45am  . CORONARY ANGIOPLASTY WITH STENT PLACEMENT     4 stents  . ESOPHAGEAL DILATION N/A 06/17/2014   Procedure: ESOPHAGEAL DILATION;  Surgeon: Daneil Dolin, MD;  Location: [facility];  Service: Endoscopy;  Laterality: N/A;  Maloney 52 Fr  . ESOPHAGOGASTRODUODENOSCOPY  06/2007   Small hiatal hernia  . ESOPHAGOGASTRODUODENOSCOPY  03/21/2011   RMR: normal esophagus- status post passage of Maloney dilator. Small  hiatal hernia. Trivial antral and bulbar erosions  . ESOPHAGOGASTRODUODENOSCOPY (EGD) WITH PROPOFOL N/A 06/17/2014   RMR: Small hiatal hernia; otherwise normal EGD. Status post passage of a Maloney dilator. No explanation for patients right upper quadrant abdominal pain.   Marland Kitchen ESOPHAGOGASTRODUODENOSCOPY (EGD) WITH PROPOFOL N/A 03/19/2016   Procedure: ESOPHAGOGASTRODUODENOSCOPY (EGD) WITH PROPOFOL;  Surgeon: Daneil Dolin, MD;  [facility];  Service: Endoscopy;  Laterality: N/A;  . KNEE SURGERY Left     arthroscopy  . LIPOMA EXCISION  03/17/2012   Procedure: EXCISION LIPOMA;  Surgeon: Gwenyth Ober, MD;  Location: [facility];  Service: General;  Laterality: Right;  excision of right lower back lipoma  . MALONEY DILATION N/A 03/19/2016   Procedure: Venia Minks DILATION;  Surgeon: Daneil Dolin, MD;  [facility];  Service: Endoscopy;  Laterality: N/A;  . POLYPECTOMY  06/17/2014   Procedure: POLYPECTOMY;  Surgeon: Daneil Dolin, MD;  Location: [facility];  Service: Endoscopy;;    Social History   Socioeconomic History  . Marital status: Married    Spouse name: Not on file  . Number of children: 4  . Years of education: 6  . Highest education level: 6th grade  Occupational History  . Occupation: Retired    Comment: Charity fundraiser  Social Needs  . Financial resource strain: Somewhat hard  . Food insecurity:    Worry: Never true    Inability: Never true  . Transportation needs:    Medical: No    Non-medical: No  Tobacco Use  . Smoking status: Never Smoker  . Smokeless tobacco: Never Used  Substance and Sexual Activity  . Alcohol use: Not Currently    Alcohol/week: 1.0 standard drinks    Types: 1 Glasses of wine per week  . Drug use: No  . Sexual activity: Yes    Birth control/protection: Post-menopausal  Lifestyle  . Physical activity:    Days per week: 0 days    Minutes per session: 0 min  . Stress: Only a little  Relationships  . Social connections:    Talks on phone: More than three times a week    Gets together: More than three times a week    Attends religious service: More than 4 times per year    Active member of club or organization: Yes    Attends meetings of clubs or organizations: More than 4 times per year    Relationship status: Married  . Intimate partner violence:    Fear of current or ex partner: No    Emotionally abused: No    Physically abused: No    Forced sexual activity: No  Other Topics Concern  . Not on file  Social  History Narrative   Widowed but is remarrying in July 2019. Has 4 adult children. Lives in a house with fiance.     Outpatient Encounter Medications as of 06/12/2018  Medication Sig  . ALPRAZolam (XANAX) 0.5 MG tablet Take 1 tablet (0.5 mg total) by mouth at bedtime as needed.  Marland Kitchen amLODipine (NORVASC) 5 MG tablet Take 1 tablet (5 mg total) by mouth daily.  Marland Kitchen escitalopram (LEXAPRO) 10 MG tablet Take 1 tablet (10 mg total) by mouth daily.  . isosorbide mononitrate (IMDUR) 60 MG 24 hr tablet TAKE ONE (1) TABLET EACH DAY  . JANUVIA 100 MG tablet TAKE ONE (1) TABLET EACH DAY  . levothyroxine (SYNTHROID, LEVOTHROID) 50 MCG tablet TAKE ONE (1) TABLET EACH DAY  . linaclotide (LINZESS) 145  MCG CAPS capsule Take 1 capsule (145 mcg total) by mouth daily before breakfast.  . lisinopril-hydrochlorothiazide (PRINZIDE,ZESTORETIC) 20-12.5 MG tablet Take 2 tablets by mouth daily.  . metFORMIN (GLUCOPHAGE) 1000 MG tablet TAKE ONE TABLET TWICE A DAY WITH FOOD  . methocarbamol (ROBAXIN) 500 MG tablet Take 1 tablet (500 mg total) by mouth 3 (three) times daily.  . metoprolol tartrate (LOPRESSOR) 50 MG tablet TAKE ONE TABLET BY MOUTH TWICE DAILY  . nitroGLYCERIN (NITROSTAT) 0.4 MG SL tablet Place 1 tablet (0.4 mg total) under the tongue every 5 (five) minutes as needed for chest pain.  . pantoprazole (PROTONIX) 40 MG tablet TAKE ONE TABLET BY MOUTH TWICE DAILY  . pravastatin (PRAVACHOL) 80 MG tablet Take 1 tablet (80 mg total) by mouth every evening. NEED OV.  Marland Kitchen spironolactone (ALDACTONE) 25 MG tablet TAKE ONE (1) TABLET EACH DAY  . traMADol (ULTRAM) 50 MG tablet Take 1 tablet (50 mg total) by mouth every 12 (twelve) hours as needed.  . Vitamin D, Ergocalciferol, (DRISDOL) 50000 units CAPS capsule TAKE 1 CAPSULE EVERY 7 DAYS  . warfarin (COUMADIN) 5 MG tablet TAKE 1 TO 1&1/2 TABLET DAILY AS DIRECTED (Patient taking differently: Take 5 mg by mouth daily. )  . amoxicillin-clavulanate (AUGMENTIN) 875-125 MG tablet Take  1 tablet by mouth 2 (two) times daily for 10 days.  . fluticasone (FLONASE) 50 MCG/ACT nasal spray Place 2 sprays into both nostrils daily.   No facility-administered encounter medications on file as of 06/12/2018.     Allergies  Allergen Reactions  . Promethazine Hcl Anaphylaxis    Review of Systems  Constitutional: Positive for chills, fatigue and fever. Negative for activity change, appetite change and unexpected weight change.  HENT: Positive for congestion, postnasal drip, rhinorrhea, sinus pressure, sinus pain, sneezing and sore throat. Negative for trouble swallowing.   Respiratory: Positive for cough and shortness of breath. Negative for chest tightness.   Cardiovascular: Negative for chest pain, palpitations and leg swelling.  Gastrointestinal: Negative for anal bleeding and blood in stool.  Genitourinary: Negative for hematuria.  Musculoskeletal: Positive for myalgias.  Neurological: Positive for headaches. Negative for dizziness, tremors, seizures, syncope, facial asymmetry, speech difficulty, weakness, light-headedness and numbness.  Hematological: Does not bruise/bleed easily.  Psychiatric/Behavioral: Negative for confusion.  All other systems reviewed and are negative.       Objective:  BP 105/66   Pulse 77   Temp 97.8 F (36.6 C) (Oral)   Ht 5\' 2"  (1.575 m)   Wt 193 lb (87.5 kg)   SpO2 97%   BMI 35.30 kg/m    Wt Readings from Last 3 Encounters:  06/12/18 193 lb (87.5 kg)  05/30/18 196 lb (88.9 kg)  05/16/18 196 lb 12.8 oz (89.3 kg)    Physical Exam Vitals signs and nursing note reviewed.  Constitutional:      General: She is in acute distress (mild).     Appearance: Normal appearance. She is well-developed and well-groomed. She is not ill-appearing or toxic-appearing.  HENT:     Head: Normocephalic and atraumatic.     Jaw: There is normal jaw occlusion.     Right Ear: Hearing, ear canal and external ear normal. A middle ear effusion is present.  Tympanic membrane is not perforated or erythematous.     Left Ear: Hearing, ear canal and external ear normal. A middle ear effusion is present. Tympanic membrane is not perforated or erythematous.     Nose: Congestion and rhinorrhea present. Rhinorrhea is clear.  Right Turbinates: Swollen.     Left Turbinates: Swollen.     Right Sinus: Maxillary sinus tenderness present. No frontal sinus tenderness.     Left Sinus: Maxillary sinus tenderness present. No frontal sinus tenderness.     Mouth/Throat:     Lips: Pink.     Mouth: Mucous membranes are moist.     Pharynx: Uvula midline. Posterior oropharyngeal erythema present. No pharyngeal swelling, oropharyngeal exudate or uvula swelling.     Tonsils: No tonsillar exudate or tonsillar abscesses.  Cardiovascular:     Rate and Rhythm: Normal rate. Rhythm regularly irregular.     Heart sounds: Normal heart sounds. No murmur. No friction rub. No gallop.   Pulmonary:     Effort: Pulmonary effort is normal. No respiratory distress.     Breath sounds: Normal breath sounds. No wheezing, rhonchi or rales.  Skin:    General: Skin is warm and dry.     Capillary Refill: Capillary refill takes less than 2 seconds.     Coloration: Skin is not pale.  Neurological:     General: No focal deficit present.     Mental Status: She is alert and oriented to person, place, and time.  Psychiatric:        Mood and Affect: Mood normal.        Behavior: Behavior normal. Behavior is cooperative.        Thought Content: Thought content normal.        Judgment: Judgment normal.     Results for orders placed or performed in visit on 06/12/18  POCT INR  Result Value Ref Range   INR 1.7 (A) 2.0 - 3.0       Pertinent labs & imaging results that were available during my care of the patient were reviewed by me and considered in my medical decision making.  Assessment & Plan:  Morrison was seen today for uri.  Diagnoses and all orders for this visit:  Acute  non-recurrent maxillary sinusitis -     amoxicillin-clavulanate (AUGMENTIN) 875-125 MG tablet; Take 1 tablet by mouth 2 (two) times daily for 10 days. -     fluticasone (FLONASE) 50 MCG/ACT nasal spray; Place 2 sprays into both nostrils daily.  Long term current use of anticoagulant therapy INR 1.7 today, goal 2-3. Increase coumadin to 5 mg tomorrow and Saturday and then return to 2.5 mg daily. Recheck INR in one week.  -     CoaguChek XS/INR Waived -     POCT INR  Paroxysmal atrial fibrillation (HCC) INR 1.7 today, goal 2-3. Increase coumadin to 5 mg tomorrow and Saturday and then return to 2.5 mg daily. Recheck INR in one week. -     CoaguChek XS/INR Waived -     POCT INR     Continue all other maintenance medications.  Follow up plan: Return in about 1 week (around 06/19/2018), or if symptoms worsen or fail to improve, for INR.  Educational handout given for sinusitis   The above assessment and management plan was discussed with the patient. The patient verbalized understanding of and has agreed to the management plan. Patient is aware to call the clinic if symptoms persist or worsen. Patient is aware when to return to the clinic for a follow-up visit. Patient educated on when it is appropriate to go to the emergency department.   Monia Pouch, FNP-C [facility] [phone]

## 2018-06-12 NOTE — Patient Instructions (Signed)
Sinusitis, Adult  Sinusitis is inflammation of your sinuses. Sinuses are hollow spaces in the bones around your face. Your sinuses are located:   Around your eyes.   In the middle of your forehead.   Behind your nose.   In your cheekbones.  Mucus normally drains out of your sinuses. When your nasal tissues become inflamed or swollen, mucus can become trapped or blocked. This allows bacteria, viruses, and fungi to grow, which leads to infection. Most infections of the sinuses are caused by a virus.  Sinusitis can develop quickly. It can last for up to 4 weeks (acute) or for more than 12 weeks (chronic). Sinusitis often develops after a cold.  What are the causes?  This condition is caused by anything that creates swelling in the sinuses or stops mucus from draining. This includes:   Allergies.   Asthma.   Infection from bacteria or viruses.   Deformities or blockages in your nose or sinuses.   Abnormal growths in the nose (nasal polyps).   Pollutants, such as chemicals or irritants in the air.   Infection from fungi (rare).  What increases the risk?  You are more likely to develop this condition if you:   Have a weak body defense system (immune system).   Do a lot of swimming or diving.   Overuse nasal sprays.   Smoke.  What are the signs or symptoms?  The main symptoms of this condition are pain and a feeling of pressure around the affected sinuses. Other symptoms include:   Stuffy nose or congestion.   Thick drainage from your nose.   Swelling and warmth over the affected sinuses.   Headache.   Upper toothache.   A cough that may get worse at night.   Extra mucus that collects in the throat or the back of the nose (postnasal drip).   Decreased sense of smell and taste.   Fatigue.   A fever.   Sore throat.   Bad breath.  How is this diagnosed?  This condition is diagnosed based on:   Your symptoms.   Your medical history.   A physical exam.   Tests to find out if your condition is  acute or chronic. This may include:  ? Checking your nose for nasal polyps.  ? Viewing your sinuses using a device that has a light (endoscope).  ? Testing for allergies or bacteria.  ? Imaging tests, such as an MRI or CT scan.  In rare cases, a bone biopsy may be done to rule out more serious types of fungal sinus disease.  How is this treated?  Treatment for sinusitis depends on the cause and whether your condition is chronic or acute.   If caused by a virus, your symptoms should go away on their own within 10 days. You may be given medicines to relieve symptoms. They include:  ? Medicines that shrink swollen nasal passages (topical intranasal decongestants).  ? Medicines that treat allergies (antihistamines).  ? A spray that eases inflammation of the nostrils (topical intranasal corticosteroids).  ? Rinses that help get rid of thick mucus in your nose (nasal saline washes).   If caused by bacteria, your health care provider may recommend waiting to see if your symptoms improve. Most bacterial infections will get better without antibiotic medicine. You may be given antibiotics if you have:  ? A severe infection.  ? A weak immune system.   If caused by narrow nasal passages or nasal polyps, you may need   to have surgery.  Follow these instructions at home:  Medicines   Take, use, or apply over-the-counter and prescription medicines only as told by your health care provider. These may include nasal sprays.   If you were prescribed an antibiotic medicine, take it as told by your health care provider. Do not stop taking the antibiotic even if you start to feel better.  Hydrate and humidify     Drink enough fluid to keep your urine pale yellow. Staying hydrated will help to thin your mucus.   Use a cool mist humidifier to keep the humidity level in your home above 50%.   Inhale steam for 10-15 minutes, 3-4 times a day, or as told by your health care provider. You can do this in the bathroom while a hot shower is  running.   Limit your exposure to cool or dry air.  Rest   Rest as much as possible.   Sleep with your head raised (elevated).   Make sure you get enough sleep each night.  General instructions     Apply a warm, moist washcloth to your face 3-4 times a day or as told by your health care provider. This will help with discomfort.   Wash your hands often with soap and water to reduce your exposure to germs. If soap and water are not available, use hand sanitizer.   Do not smoke. Avoid being around people who are smoking (secondhand smoke).   Keep all follow-up visits as told by your health care provider. This is important.  Contact a health care provider if:   You have a fever.   Your symptoms get worse.   Your symptoms do not improve within 10 days.  Get help right away if:   You have a severe headache.   You have persistent vomiting.   You have severe pain or swelling around your face or eyes.   You have vision problems.   You develop confusion.   Your neck is stiff.   You have trouble breathing.  Summary   Sinusitis is soreness and inflammation of your sinuses. Sinuses are hollow spaces in the bones around your face.   This condition is caused by nasal tissues that become inflamed or swollen. The swelling traps or blocks the flow of mucus. This allows bacteria, viruses, and fungi to grow, which leads to infection.   If you were prescribed an antibiotic medicine, take it as told by your health care provider. Do not stop taking the antibiotic even if you start to feel better.   Keep all follow-up visits as told by your health care provider. This is important.  This information is not intended to replace advice given to you by your health care provider. Make sure you discuss any questions you have with your health care provider.  Document Released: 03/26/2005 Document Revised: 08/26/2017 Document Reviewed: 08/26/2017  Elsevier Interactive Patient Education  2019 [employer]

## 2018-06-13 ENCOUNTER — Ambulatory Visit: Payer: Medicare Other | Admitting: Family

## 2018-06-19 ENCOUNTER — Encounter: Payer: Self-pay | Admitting: Family

## 2018-06-19 ENCOUNTER — Other Ambulatory Visit: Payer: Self-pay

## 2018-06-19 ENCOUNTER — Ambulatory Visit (INDEPENDENT_AMBULATORY_CARE_PROVIDER_SITE_OTHER): Payer: Medicare Other | Admitting: Family

## 2018-06-19 DIAGNOSIS — I480 Paroxysmal atrial fibrillation: Secondary | ICD-10-CM

## 2018-06-19 DIAGNOSIS — R05 Cough: Secondary | ICD-10-CM

## 2018-06-19 DIAGNOSIS — Z7901 Long term (current) use of anticoagulants: Secondary | ICD-10-CM

## 2018-06-19 DIAGNOSIS — R059 Cough, unspecified: Secondary | ICD-10-CM

## 2018-06-19 LAB — COAGUCHEK XS/INR WAIVED
INR: 1.6 — ABNORMAL HIGH (ref 0.9–1.1)
Prothrombin Time: 19.1 s

## 2018-06-19 MED ORDER — BENZONATATE 200 MG PO CAPS: 200.0000 mg | ORAL_CAPSULE | Freq: Three times a day (TID) | ORAL | 1 refills | Status: DC | PRN

## 2018-06-19 NOTE — Progress Notes (Signed)
Subjective:    Patient ID: Rhonda Garcia, female    DOB: November 08, 1940, 78 y.o.   MRN: [id number]  Chief Complaint  Patient presents with  . recheck protime   Pt presents to the office today to recheck INR. She has A Fib and takes warfarin. She still has 2-3 days left Augmentin rx. See anticoagulation flow sheet.  She has been under a great deal of stress as her husband has been in the hospital for the last 9 days and has been caring for him.  Cough  This is a recurrent problem. The current episode started 1 to 4 weeks ago. The problem has been waxing and waning. The problem occurs every few minutes. The cough is productive of sputum. Associated symptoms include nasal congestion, postnasal drip and rhinorrhea. Pertinent negatives include no ear congestion, ear pain or sore throat. Treatments tried: augmenting. The treatment provided mild relief.      Review of Systems  HENT: Positive for postnasal drip and rhinorrhea. Negative for ear pain and sore throat.   Respiratory: Positive for cough.   All other systems reviewed and are negative.      Objective:   Physical Exam Vitals signs reviewed.  Constitutional:      General: She is not in acute distress.    Appearance: She is well-developed.  HENT:     Head: Normocephalic and atraumatic.     Right Ear: Tympanic membrane normal.     Left Ear: Tympanic membrane normal.     Nose: Mucosal edema and rhinorrhea present.     Mouth/Throat:     Pharynx: Posterior oropharyngeal erythema present.  Eyes:     Pupils: Pupils are equal, round, and reactive to light.  Neck:     Musculoskeletal: Normal range of motion and neck supple.     Thyroid: No thyromegaly.  Cardiovascular:     Rate and Rhythm: Normal rate and regular rhythm.     Heart sounds: Normal heart sounds. No murmur.  Pulmonary:     Effort: Pulmonary effort is normal. No respiratory distress.     Breath sounds: Normal breath sounds. No wheezing.     Comments: Intermittent  nonproductive cough Abdominal:     General: Bowel sounds are normal. There is no distension.     Palpations: Abdomen is soft.     Tenderness: There is no abdominal tenderness.  Musculoskeletal: Normal range of motion.        General: No tenderness.  Skin:    General: Skin is warm and dry.  Neurological:     Mental Status: She is alert and oriented to person, place, and time.     Cranial Nerves: No cranial nerve deficit.     Deep Tendon Reflexes: Reflexes are normal and symmetric.  Psychiatric:        Behavior: Behavior normal.        Thought Content: Thought content normal.        Judgment: Judgment normal.       BP 118/69   Pulse 63   Temp 97.6 F (36.4 C) (Oral)   Ht 5\' 2"  (1.575 m)   Wt 183 lb 3.2 oz (83.1 kg)   BMI 33.51 kg/m      Assessment & Plan:  LAQUISHA NORTHCRAFT comes in today with chief complaint of recheck protime   Diagnosis and orders addressed:  1. Paroxysmal atrial fibrillation (HCC) - CoaguChek XS/INR Waived  2. Long term current use of anticoagulant therapy Description   INR today  1.6 (goal is 2-3)  Too thick  Increase today to 5 mg (1 tablet) and then change to 2.5 mg (1/2 tablet) all days except Monday and Friday 5 mg (1 tablet).        - CoaguChek XS/INR Waived  3. Morbid obesity ([place])  4. Cough Continue Augmentin and flonase Force fluids Tessalon as needed  Start Mucinex  - benzonatate (TESSALON) 200 MG capsule; Take 1 capsule (200 mg total) by mouth 3 (three) times daily as needed.  Dispense: 30 capsule; Refill: [place], FNP

## 2018-06-19 NOTE — Patient Instructions (Signed)
Description   INR today 1.6 (goal is 2-3)  Too thick  Increase today to 5 mg (1 tablet) and then change to 2.5 mg (1/2 tablet) all days except Monday and Friday 5 mg (1 tablet).

## 2018-07-03 ENCOUNTER — Other Ambulatory Visit: Payer: Self-pay

## 2018-07-04 ENCOUNTER — Other Ambulatory Visit: Payer: Self-pay

## 2018-07-04 ENCOUNTER — Ambulatory Visit: Payer: BLUE CROSS/BLUE SHIELD | Admitting: Family

## 2018-07-07 ENCOUNTER — Other Ambulatory Visit: Payer: Self-pay

## 2018-07-07 ENCOUNTER — Encounter: Payer: Self-pay | Admitting: Family

## 2018-07-07 ENCOUNTER — Ambulatory Visit (INDEPENDENT_AMBULATORY_CARE_PROVIDER_SITE_OTHER): Payer: Medicare Other | Admitting: Family

## 2018-07-07 VITALS — BP 108/60 | HR 66 | Temp 97.3°F | Ht 62.0 in | Wt 193.4 lb

## 2018-07-07 DIAGNOSIS — I480 Paroxysmal atrial fibrillation: Secondary | ICD-10-CM | POA: Diagnosis not present

## 2018-07-07 DIAGNOSIS — I152 Hypertension secondary to endocrine disorders: Secondary | ICD-10-CM

## 2018-07-07 DIAGNOSIS — I10 Essential (primary) hypertension: Secondary | ICD-10-CM

## 2018-07-07 DIAGNOSIS — E118 Type 2 diabetes mellitus with unspecified complications: Secondary | ICD-10-CM | POA: Diagnosis not present

## 2018-07-07 DIAGNOSIS — M1990 Unspecified osteoarthritis, unspecified site: Secondary | ICD-10-CM

## 2018-07-07 DIAGNOSIS — E1169 Type 2 diabetes mellitus with other specified complication: Secondary | ICD-10-CM

## 2018-07-07 DIAGNOSIS — E785 Hyperlipidemia, unspecified: Secondary | ICD-10-CM

## 2018-07-07 DIAGNOSIS — Z7901 Long term (current) use of anticoagulants: Secondary | ICD-10-CM

## 2018-07-07 DIAGNOSIS — E039 Hypothyroidism, unspecified: Secondary | ICD-10-CM

## 2018-07-07 DIAGNOSIS — M1712 Unilateral primary osteoarthritis, left knee: Secondary | ICD-10-CM

## 2018-07-07 DIAGNOSIS — E1159 Type 2 diabetes mellitus with other circulatory complications: Secondary | ICD-10-CM

## 2018-07-07 DIAGNOSIS — E1165 Type 2 diabetes mellitus with hyperglycemia: Secondary | ICD-10-CM

## 2018-07-07 LAB — BAYER DCA HB A1C WAIVED: HB A1C (BAYER DCA - WAIVED): 7.2 % — ABNORMAL HIGH (ref <7.0)

## 2018-07-07 LAB — COAGUCHEK XS/INR WAIVED
INR: 3 — ABNORMAL HIGH (ref 0.9–1.1)
Prothrombin Time: 36.3 s

## 2018-07-07 MED ORDER — METHYLPREDNISOLONE ACETATE 40 MG/ML IJ SUSP
40.0000 mg | Freq: Once | INTRAMUSCULAR | Status: AC
  Administered 2018-07-07: 40 mg via INTRA_ARTICULAR

## 2018-07-07 MED ORDER — BUPIVACAINE HCL 0.25 % IJ SOLN
1.0000 mL | Freq: Once | INTRAMUSCULAR | Status: AC
  Administered 2018-07-07: 1 mL via INTRA_ARTICULAR

## 2018-07-07 NOTE — Patient Instructions (Signed)

## 2018-07-07 NOTE — Progress Notes (Signed)
Subjective:    Patient ID: Rhonda Garcia, female    DOB: Jan 17, 1941, 78 y.o.   MRN: [id number]  Chief Complaint  Patient presents with  . protime recheck   Pt presents to the office today to recheck INR and chronic follow up.  Diabetes  She presents for her follow-up diabetic visit. She has type 2 diabetes mellitus. Her disease course has been stable. There are no hypoglycemic associated symptoms. Associated symptoms include fatigue. Pertinent negatives for diabetes include no blurred vision, no foot paresthesias and no visual change. Symptoms are stable. Diabetic complications include heart disease. Risk factors for coronary artery disease include diabetes mellitus, dyslipidemia, hypertension, sedentary lifestyle and post-menopausal. She is following a generally healthy diet. Her overall blood glucose range is 130-140 mg/dl. Eye exam is not current.  Hypertension  This is a chronic problem. The current episode started more than 1 year ago. The problem has been resolved since onset. The problem is controlled. Associated symptoms include malaise/fatigue. Pertinent negatives include no blurred vision, peripheral edema or shortness of breath. Risk factors for coronary artery disease include dyslipidemia, diabetes mellitus, obesity and sedentary lifestyle. The current treatment provides moderate improvement. Hypertensive end-organ damage includes CAD/MI. Identifiable causes of hypertension include a thyroid problem.  Thyroid Problem  Presents for follow-up visit. Symptoms include fatigue. Patient reports no depressed mood, dry skin or visual change. The symptoms have been stable. Her past medical history is significant for hyperlipidemia.  Arthritis  Presents for follow-up visit. She complains of pain and stiffness. The symptoms have been stable. Affected locations include the left knee. Her pain is at a severity of 6/10. Associated symptoms include fatigue.  Hyperlipidemia  This is a chronic  problem. The current episode started more than 1 year ago. Recent lipid tests were reviewed and are normal. Exacerbating diseases include hypothyroidism and obesity. Pertinent negatives include no shortness of breath. Risk factors for coronary artery disease include dyslipidemia, diabetes mellitus, hypertension, a sedentary lifestyle and post-menopausal.   A Fib Pt currently taking warfarin. See anticoagulation flow sheet.     Review of Systems  Constitutional: Positive for fatigue and malaise/fatigue.  Eyes: Negative for blurred vision.  Respiratory: Negative for shortness of breath.   Musculoskeletal: Positive for arthritis and stiffness.  All other systems reviewed and are negative.      Objective:   Physical Exam Vitals signs reviewed.  Constitutional:      General: She is not in acute distress.    Appearance: She is well-developed.  HENT:     Head: Normocephalic and atraumatic.     Right Ear: Tympanic membrane normal.     Left Ear: Tympanic membrane normal.  Eyes:     Pupils: Pupils are equal, round, and reactive to light.  Neck:     Musculoskeletal: Normal range of motion and neck supple.     Thyroid: No thyromegaly.  Cardiovascular:     Rate and Rhythm: Normal rate and regular rhythm.     Heart sounds: Normal heart sounds. No murmur.  Pulmonary:     Effort: Pulmonary effort is normal. No respiratory distress.     Breath sounds: Normal breath sounds. No wheezing.  Abdominal:     General: Bowel sounds are normal. There is no distension.     Palpations: Abdomen is soft.     Tenderness: There is no abdominal tenderness.  Musculoskeletal:        General: No tenderness.     Comments: Left knee tenderness with flexion and extension  Skin:    General: Skin is warm and dry.  Neurological:     Mental Status: She is alert and oriented to person, place, and time.     Cranial Nerves: No cranial nerve deficit.     Deep Tendon Reflexes: Reflexes are normal and symmetric.   Psychiatric:        Behavior: Behavior normal.        Thought Content: Thought content normal.        Judgment: Judgment normal.     leftknee prepped with betadine Injected with Marcaine .5% plain and methylprednisolone with 25 guage needle x 1. Patient tolerated well.   BP 108/60   Pulse 66   Temp (!) 97.3 F (36.3 C) (Oral)   Ht 5' 2"  (1.575 m)   Wt 193 lb 6.4 oz (87.7 kg)   BMI 35.37 kg/m      Assessment & Plan:  Rhonda Garcia comes in today with chief complaint of protime recheck   Diagnosis and orders addressed:  1. Type 2 diabetes mellitus with complication, without long-term current use of insulin (HCC) - Bayer DCA Hb A1c Waived - CMP14+EGFR - CBC with Differential/Platelet - Microalbumin / creatinine urine ratio  2. Paroxysmal atrial fibrillation (HCC) - CoaguChek XS/INR Waived - CMP14+EGFR - CBC with Differential/Platelet  3. Long term current use of anticoagulant therapy - CoaguChek XS/INR Waived - CMP14+EGFR - CBC with Differential/Platelet  4. Morbid obesity ([place]) - CMP14+EGFR - CBC with Differential/Platelet  5. Hypertension associated with diabetes ([place]) - CMP14+EGFR - CBC with Differential/Platelet  6. Type 2 diabetes mellitus with hyperglycemia, without long-term current use of insulin (HCC) - CMP14+EGFR - CBC with Differential/Platelet  7. Osteoarthritis, unspecified osteoarthritis type, unspecified site - CMP14+EGFR - CBC with Differential/Platelet  8. Hypothyroidism, unspecified type - CMP14+EGFR - CBC with Differential/Platelet - TSH  9. Hyperlipidemia associated with type 2 diabetes mellitus (HCC) - CMP14+EGFR - CBC with Differential/Platelet - Lipid panel  10. Primary osteoarthritis of left knee - bupivacaine (MARCAINE) 0.25 % (with pres) injection 1 mL - methylPREDNISolone acetate (DEPO-MEDROL) injection 40 mg   Labs pending Health Maintenance reviewed Diet and exercise encouraged  Follow up plan: 3 months     Evelina Dun, FNP

## 2018-07-08 LAB — CMP14+EGFR
ALT: 13 IU/L (ref 0–32)
AST: 17 IU/L (ref 0–40)
Albumin/Globulin Ratio: 2 (ref 1.2–2.2)
Albumin: 4.1 g/dL (ref 3.7–4.7)
Alkaline Phosphatase: 77 IU/L (ref 39–117)
BUN/Creatinine Ratio: 17 (ref 12–28)
BUN: 17 mg/dL (ref 8–27)
Bilirubin Total: 0.5 mg/dL (ref 0.0–1.2)
CO2: 22 mmol/L (ref 20–29)
Calcium: 9.6 mg/dL (ref 8.7–10.3)
Chloride: 105 mmol/L (ref 96–106)
Creatinine, Ser: 1.01 mg/dL — ABNORMAL HIGH (ref 0.57–1.00)
GFR calc Af Amer: 62 mL/min/{1.73_m2} (ref >59)
GFR calc non Af Amer: 54 mL/min/{1.73_m2} — ABNORMAL LOW (ref >59)
Globulin, Total: 2.1 g/dL (ref 1.5–4.5)
Glucose: 149 mg/dL — ABNORMAL HIGH (ref 65–99)
Potassium: 4.9 mmol/L (ref 3.5–5.2)
Sodium: 141 mmol/L (ref 134–144)
Total Protein: 6.2 g/dL (ref 6.0–8.5)

## 2018-07-08 LAB — CBC WITH DIFFERENTIAL/PLATELET
Basophils Absolute: 0 10*3/uL (ref 0.0–0.2)
Basos: 1 %
EOS (ABSOLUTE): 0.2 10*3/uL (ref 0.0–0.4)
Eos: 4 %
Hematocrit: 37.3 % (ref 34.0–46.6)
Hemoglobin: 11.7 g/dL (ref 11.1–15.9)
Immature Grans (Abs): 0 10*3/uL (ref 0.0–0.1)
Immature Granulocytes: 0 %
Lymphocytes Absolute: 1.5 10*3/uL (ref 0.7–3.1)
Lymphs: 22 %
MCH: 28.5 pg (ref 26.6–33.0)
MCHC: 31.4 g/dL — ABNORMAL LOW (ref 31.5–35.7)
MCV: 91 fL (ref 79–97)
Monocytes Absolute: 0.6 10*3/uL (ref 0.1–0.9)
Monocytes: 9 %
Neutrophils Absolute: 4.2 10*3/uL (ref 1.4–7.0)
Neutrophils: 64 %
Platelets: 179 10*3/uL (ref 150–450)
RBC: 4.11 x10E6/uL (ref 3.77–5.28)
RDW: 13.9 % (ref 11.7–15.4)
WBC: 6.5 10*3/uL (ref 3.4–10.8)

## 2018-07-08 LAB — TSH: TSH: 1.39 u[IU]/mL (ref 0.450–4.500)

## 2018-07-08 LAB — LIPID PANEL
Chol/HDL Ratio: 4.4 ratio (ref 0.0–4.4)
Cholesterol, Total: 164 mg/dL (ref 100–199)
HDL: 37 mg/dL — ABNORMAL LOW (ref >39)
LDL Calculated: 109 mg/dL — ABNORMAL HIGH (ref 0–99)
Triglycerides: 91 mg/dL (ref 0–149)
VLDL Cholesterol Cal: 18 mg/dL (ref 5–40)

## 2018-07-08 LAB — MICROALBUMIN / CREATININE URINE RATIO
Creatinine, Urine: 212.7 mg/dL
Microalb/Creat Ratio: 4 mg/g creat (ref 0–29)
Microalbumin, Urine: 8.2 ug/mL

## 2018-07-18 ENCOUNTER — Other Ambulatory Visit: Payer: Self-pay | Admitting: Family

## 2018-07-18 DIAGNOSIS — Z7901 Long term (current) use of anticoagulants: Secondary | ICD-10-CM

## 2018-07-22 ENCOUNTER — Ambulatory Visit (INDEPENDENT_AMBULATORY_CARE_PROVIDER_SITE_OTHER): Payer: Medicare Other | Admitting: Nurse Practitioner

## 2018-07-22 ENCOUNTER — Encounter: Payer: Self-pay | Admitting: Internal Medicine

## 2018-07-22 ENCOUNTER — Other Ambulatory Visit: Payer: Self-pay

## 2018-07-22 ENCOUNTER — Telehealth: Payer: Self-pay

## 2018-07-22 ENCOUNTER — Encounter: Payer: Self-pay | Admitting: Nurse Practitioner

## 2018-07-22 DIAGNOSIS — K5900 Constipation, unspecified: Secondary | ICD-10-CM | POA: Diagnosis not present

## 2018-07-22 DIAGNOSIS — Z86010 Personal history of colonic polyps: Secondary | ICD-10-CM

## 2018-07-22 DIAGNOSIS — D649 Anemia, unspecified: Secondary | ICD-10-CM

## 2018-07-22 MED ORDER — PEG 3350-KCL-NA BICARB-NACL 420 G PO SOLR: 4000.0000 mL | ORAL | 0 refills | Status: DC

## 2018-07-22 NOTE — Telephone Encounter (Signed)
Randall Hiss, how would you like for her to adjust diabetic medications prior to TCS?  Called pt, TCS w/Propofol w/RMR scheduled for 10/02/18 at 9:15am. Pt informed to hold Coumadin x 3 days prior to TCS. Rx for prep sent to pharmacy. Orders entered.

## 2018-07-22 NOTE — Assessment & Plan Note (Signed)
History of adenomatous colon polyps.  Previous colonoscopy in 2017 and recommended 3-year repeat exam.  She is currently due.  She is also had a transient episode of anemia in the setting of warfarin.  She will need to hold her warfarin for 3 days prior to her procedure.  We will reach out to her primary care provider who has pharmacy on staff for anticoagulation clinic to discuss holding Coumadin for 3 days and any possible need for Lovenox bridge.  Return for follow-up in 4 months.  Proceed with TCS on propofol/MAC with Dr. Gala Romney in near future: the risks, benefits, and alternatives have been discussed with the patient in detail. The patient states understanding and desires to proceed.  Patient is currently on warfarin.  She is also on Ultram every 12 hours as needed and Xanax 0.5 mg at bedtime as needed.  No other anticoagulants, anxiolytics, chronic pain medications, or antidepressants.  We will plan for the procedure on propofol/MAC to promote adequate sedation.

## 2018-07-22 NOTE — Telephone Encounter (Signed)
Ok, to hold warfarin for 3 days. Does not need Lovenox.

## 2018-07-22 NOTE — Telephone Encounter (Signed)
Christy A. Luan Pulling, FNP,  Walden Field, NP would like to schedule pts colonoscopy with Dr. Gala Romney. Is it ok for pt to hold Coumadin x 3 days prior to procedure? Will pt need Lovenox Bridge? Please advise. Thank you.

## 2018-07-22 NOTE — Assessment & Plan Note (Signed)
The patient had transient anemia which is now normalized in the setting of chronic warfarin use.  Colonoscopy and EGD both completed approximately 3 years ago which found polyps and grade a esophagitis.  She is on Protonix 40 mg twice daily.  She is due for repeat colonoscopy, as per below.  At this point we will follow her anemia for recurrence.  I am not convinced that she would necessarily benefit from a repeat EGD within 3 years.  If she has recurrence of anemia there may be better justification for upper endoscopy.  At this time we will repeat colonoscopy based on previous recommendations and follow her anemia clinically.  Follow-up in 4 months.

## 2018-07-22 NOTE — Patient Instructions (Signed)
Your health issues we discussed today were:   Constipation: 1. I am sending in a new prescription for Linzess 145 mcg daily. 2. If you need more than that you can take MiraLAX 1 dose 1-2 times a day to help have regular bowel movements 3. Call us if you have any severe symptoms  Anemia: 1. Your anemia was mild and has since resolved 2. You are due for colonoscopy anyway and we will proceed with this at this time 3. I do not think you need an upper endoscopy at this time as it was just completed 3 years ago and your anemia is likely explained by Coumadin use 4. We will need to hold your Coumadin for 3 days prior to your procedure.  We will work with your primary care provider to accomplish this 5. Call us if you have any obvious bleeding in your stools  Overall I recommend:  1. Continue your other medications 2. Return for follow-up in 4 months 3. Call us if you have any questions or concerns   Because of recent events of COVID-19 ("Coronavirus"), follow CDC recommendations:  1. Wash your hand frequently 2. Avoid touching your face 3. Stay away from people who are sick 4. If you have symptoms such as fever, cough, shortness of breath then call your healthcare provider for further guidance 5. If you are sick, STAY AT HOME unless otherwise directed by your healthcare provider. 6. Follow directions from state and national officials regarding staying safe   At [facility] we value your feedback. You may receive a survey about your visit today. Please share your experience as we strive to create trusting relationships with our patients to provide genuine, compassionate, quality care.  We appreciate your understanding and patience as we review any laboratory studies, imaging, and other diagnostic tests that are ordered as we care for you. Our office policy is 5 business days for review of these results, and any emergent or urgent results are addressed in a timely manner for  your best interest. If you do not hear from our office in 1 week, please contact us.   We also encourage the use of MyChart, which contains your medical information for your review as well. If you are not enrolled in this feature, an access code is on this after visit summary for your convenience. Thank you for allowing [place] to be involved in your care.  It was great to see you today!  I hope you have a great day!!

## 2018-07-22 NOTE — Telephone Encounter (Signed)
Routing to EG and [facility] Clinical

## 2018-07-22 NOTE — Assessment & Plan Note (Signed)
Some worsening constipation on decreased dose of Linzess.  She is requesting to go up to the higher dose.  I will send in a new prescription for Linzess 145 mcg daily.  Follow-up in 4 months.

## 2018-07-22 NOTE — Telephone Encounter (Signed)
Pt called office, she checked at pharmacy but Linzess rx hasn't been sent yet.

## 2018-07-22 NOTE — Progress Notes (Signed)
cc'ed to pcp °

## 2018-07-22 NOTE — Progress Notes (Signed)
Referring Provider: Sharion Balloon, FNP Primary Care Physician:  Sharion Balloon, FNP Primary GI:  Dr. Gala Romney  NOTE: Service was provided via telemedicine and was requested by the patient due to COVID-19 pandemic.  Method of visit: Telephone  Patient Location: Home  Provider Location: Office  Reason for Phone Visit: Referral for anemia  The patient was consented to phone follow-up via telephone encounter including billing of the encounter (yes/no): Yes  Persons present on the phone encounter, with roles: Husband  Total time (minutes) spent on medical discussion: 22 minutes  Chief Complaint  Patient presents with   Anemia    Discuss colonoscopy    HPI:   Rhonda Garcia is a 78 y.o. female who presents for virtual visit regarding: evaluation of anemia.  The patient was last seen in our office 08/29/2016 for constipation.  Colonoscopy up-to-date 03/19/2016 with internal hemorrhoids, diverticulosis in the entire colon, a 1 cm lipoma in the ascending segment, otherwise normal.  Recommended repeat in 3 years (2020).  EGD at same time found LA grade a esophagitis status post dilation.  At that time constipation was controlled with Linzess 145 mcg which typically causes diarrhea but is ineffective at every other day dosing.  Recommended change Linzess dose to 72 mcg daily, progress report in 1 to 2 weeks and follow-up in 2 months.  There is no phone follow-up in the patient was a no-show to her follow-up office visit.  The patient did request a refill of a different dose of Linzess in 2019 and it was recommended she come in for office visit.  The patient was referred to our office 05/08/2018 for anemia.  At that time she was evaluated by primary care for multiple complaints, including hospital follow-up.  She was admitted after a drop in hemoglobin to 10.5 from 12 a week prior.  CT scan did not find any acute findings, positive iFOBT.  On chronic anticoagulation for A. fib with warfarin.   Recommended labs and referral to GI.  Labs are completed 05/08/2026 which found normal iron, normal ferritin (34), normalized hemoglobin at 11.3, normal platelets of 179.  His recent CBC completed 07/07/2018 which found stable hemoglobin at 11.7.  Today she states she's doing ok. Initially Linzess 72 worked but it started taking longer and is wanting to go back to the 145 mcg dose.  Has been having a bowel movement every couple days. Mild lower abdominal discomfort which does improve after a bowel movement. Has also had intermittent nausea and poor appetite with constipation which generally started with worsening constipation. Has intermittent right-sided abdominal pain that self-resolves, occurs if she bends a certain way and feels like "it's grabbed and doesn't want to let up." Has had constipation for a number of years. Denies hematochezia. Occasional dark stools but not pitch black and tarry. Denies fever, chills, unintentional weight loss. Denies URI and flu-like symptoms. Does had dyspnea on exertion for the past month and has been evaluated by her PCP for this. Denies chest pain, lightheadedness, syncope, near syncope. Denies any other upper or lower GI symptoms.  Past Medical History:  Diagnosis Date   A-fib ([place])    Anal fissure    resolved    Anemia    Back pain    with left radiculopathy   CAD (coronary artery disease)    Cataract    DDD (degenerative disc disease)    Depression    Diabetes mellitus    Diverticulosis    Dyslipidemia  Dysrhythmia    AFib   Esophagitis    GERD (gastroesophageal reflux disease)    Hiatal hernia    Hx of peptic ulcer    Hyperlipidemia    Hypertension    Hypothyroidism    Internal hemorrhoids 2017   NSTEMI (non-ST elevated myocardial infarction) ([place]) 08/05/2015   Osteopenia    Sleep apnea    not using CPAP now, could not tolerate and PCP aware.   Thyroid disease     Past Surgical History:  Procedure Laterality  Date   ABDOMINAL HYSTERECTOMY Bilateral 2001 - approximate   APPENDECTOMY     BREAST REDUCTION SURGERY     CARDIAC CATHETERIZATION N/A 08/08/2015   Procedure: Left Heart Cath and Coronary Angiography;  Surgeon: Burnell Blanks, MD;  Location: [facility];  Service: Cardiovascular;  Laterality: N/A;   CHOLECYSTECTOMY  2008 - approximate   COLONOSCOPY  06/2007   Friable anal canal and anal papillae. Pancolonic diverticula. Normal terminal ileum. Status post segmental biopsy, descending colon biopsy showed minimal cryptitis. Biopsies felt to be  nonspecific but could be seen with NSAIDs. No features of inflammatory bowel disease. Stool studies were negative.   COLONOSCOPY  03/21/2011   RMR: colonic polyps treated as described above, colonic diverticulosis (Tubular adenomas)   COLONOSCOPY WITH PROPOFOL N/A 06/17/2014   RMR: Colonic diverticulosis. Multiple colonic polyps removed as described above.    COLONOSCOPY WITH PROPOFOL N/A 03/19/2016   Procedure: COLONOSCOPY WITH PROPOFOL;  Surgeon: Daneil Dolin, MD;  Location: [facility];  Service: Endoscopy;  Laterality: N/A;  8:45am   CORONARY ANGIOPLASTY WITH STENT PLACEMENT     4 stents   ESOPHAGEAL DILATION N/A 06/17/2014   Procedure: ESOPHAGEAL DILATION;  Surgeon: Daneil Dolin, MD;  Location: [facility];  Service: Endoscopy;  Laterality: N/A;  Maloney 63 Fr   ESOPHAGOGASTRODUODENOSCOPY  06/2007   Small hiatal hernia   ESOPHAGOGASTRODUODENOSCOPY  03/21/2011   RMR: normal esophagus- status post passage of Maloney dilator. Small hiatal hernia. Trivial antral and bulbar erosions   ESOPHAGOGASTRODUODENOSCOPY (EGD) WITH PROPOFOL N/A 06/17/2014   RMR: Small hiatal hernia; otherwise normal EGD. Status post passage of a Maloney dilator. No explanation for patients right upper quadrant abdominal pain.    ESOPHAGOGASTRODUODENOSCOPY (EGD) WITH PROPOFOL N/A 03/19/2016   Procedure: ESOPHAGOGASTRODUODENOSCOPY (EGD) WITH PROPOFOL;   Surgeon: Daneil Dolin, MD;  [facility];  Service: Endoscopy;  Laterality: N/A;   KNEE SURGERY Left    arthroscopy   LIPOMA EXCISION  03/17/2012   Procedure: EXCISION LIPOMA;  Surgeon: Gwenyth Ober, MD;  Location: [place] [facility];  Service: General;  Laterality: Right;  excision of right lower back lipoma   MALONEY DILATION N/A 03/19/2016   Procedure: Venia Minks DILATION;  Surgeon: Daneil Dolin, MD;  [facility];  Service: Endoscopy;  Laterality: N/A;   POLYPECTOMY  06/17/2014   Procedure: POLYPECTOMY;  Surgeon: Daneil Dolin, MD;  Location: [facility];  Service: Endoscopy;;    Current Outpatient Medications  Medication Sig Dispense Refill   ALPRAZolam (XANAX) 0.5 MG tablet Take 1 tablet (0.5 mg total) by mouth at bedtime as needed. 30 tablet 4   amLODipine (NORVASC) 5 MG tablet Take 1 tablet (5 mg total) by mouth daily. 90 tablet 3   escitalopram (LEXAPRO) 10 MG tablet Take 1 tablet (10 mg total) by mouth daily. 90 tablet 3   fluticasone (FLONASE) 50 MCG/ACT nasal spray Place 2 sprays into both nostrils daily. 16 g 6  isosorbide mononitrate (IMDUR) 60 MG 24 hr tablet TAKE ONE (1) TABLET EACH DAY 90 tablet 3   JANUVIA 100 MG tablet TAKE ONE (1) TABLET EACH DAY 90 tablet 0   levothyroxine (SYNTHROID, LEVOTHROID) 50 MCG tablet TAKE ONE (1) TABLET EACH DAY 90 tablet 1   linaclotide (LINZESS) 145 MCG CAPS capsule Take 1 capsule (145 mcg total) by mouth daily before breakfast. 30 capsule 6   lisinopril-hydrochlorothiazide (PRINZIDE,ZESTORETIC) 20-12.5 MG tablet Take 2 tablets by mouth daily. 180 tablet 6   metFORMIN (GLUCOPHAGE) 1000 MG tablet TAKE ONE TABLET TWICE A DAY WITH FOOD 180 tablet 0   methocarbamol (ROBAXIN) 500 MG tablet Take 1 tablet (500 mg total) by mouth 3 (three) times daily. 21 tablet 0   metoprolol tartrate (LOPRESSOR) 50 MG tablet TAKE ONE TABLET BY MOUTH TWICE DAILY 180 tablet 1   nitroGLYCERIN (NITROSTAT) 0.4 MG SL tablet  Place 1 tablet (0.4 mg total) under the tongue every 5 (five) minutes as needed for chest pain. 20 tablet 1   pantoprazole (PROTONIX) 40 MG tablet TAKE ONE TABLET BY MOUTH TWICE DAILY 180 tablet 0   pravastatin (PRAVACHOL) 80 MG tablet Take 1 tablet (80 mg total) by mouth every evening. NEED OV. 90 tablet 2   spironolactone (ALDACTONE) 25 MG tablet TAKE ONE (1) TABLET EACH DAY 90 tablet 2   traMADol (ULTRAM) 50 MG tablet Take 1 tablet (50 mg total) by mouth every 12 (twelve) hours as needed. 60 tablet 5   Vitamin D, Ergocalciferol, (DRISDOL) 50000 units CAPS capsule TAKE 1 CAPSULE EVERY 7 DAYS 12 capsule 0   warfarin (COUMADIN) 5 MG tablet TAKE 1 TO 1&1/2 TABLET DAILY AS DIRECTED 135 tablet 1   No current facility-administered medications for this visit.     Allergies as of 07/22/2018 - Review Complete 07/22/2018  Allergen Reaction Noted   Promethazine hcl Anaphylaxis 09/06/2010    Family History  Problem Relation Age of Onset   Heart attack Mother    Hypertension Mother    Stroke Mother        Deceased, age 55   Diabetes Sister    Diabetes Sister    Cancer Sister        ovarian   Stroke Sister    Diabetes Brother        also has a blood disorder    Clotting disorder Brother    Kidney disease Brother    Heart disease Brother    Melanoma Brother        deceased   Coronary artery disease Other    Kidney disease Other    Diabetes Son    Hypertension Son    Coronary artery disease Son 20       CABG   Hypertension Son    Coronary artery disease Son 59       CABG   Colon cancer Neg Hx     Social History   Socioeconomic History   Marital status: Married    Spouse name: Not on file   Number of children: 4   Years of education: 6   Highest education level: 6th grade  Occupational History   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: Somewhat hard   Food insecurity:    Worry: Never true      Inability: Never true   Transportation needs:    Medical: No    Non-medical: No  Tobacco Use   Smoking status: Never Smoker  Smokeless tobacco: Never Used  Substance and Sexual Activity   Alcohol use: Not Currently    Alcohol/week: 1.0 standard drinks    Types: 1 Glasses of wine per week   Drug use: No   Sexual activity: Yes    Birth control/protection: Post-menopausal  Lifestyle   Physical activity:    Days per week: 0 days    Minutes per session: 0 min   Stress: Only a little  Relationships   Social connections:    Talks on phone: More than three times a week    Gets together: More than three times a week    Attends religious service: More than 4 times per year    Active member of club or organization: Yes    Attends meetings of clubs or organizations: More than 4 times per year    Relationship status: Married  Other Topics Concern   Not on file  Social History Narrative   Widowed but is remarrying in July 2019. Has 4 adult children. Lives in a house with fiance.     Review of Systems: General: Negative for anorexia, weight loss, fever, chills, fatigue, weakness. ENT: Negative for hoarseness, difficulty swallowing. CV: Negative for chest pain, angina, palpitations, peripheral edema.  Respiratory: Negative for dyspnea at rest, cough, sputum, wheezing.  GI: See history of present illness. Endo: Negative for unusual weight change.  Heme: Negative for bruising or bleeding. Allergy: Negative for rash or hives.  Physical Exam: Note: limited exam due to virtual visit General:   Alert and oriented. Pleasant and cooperative. Eyes:  Without icterus, sclera clear and conjunctiva pink.  Ears:  Normal auditory acuity. Skin:  Intact without facial significant lesions or rashes. Neurologic:  Alert and oriented x4 Psych:  Alert and cooperative. Normal mood and affect.

## 2018-07-24 MED ORDER — LINACLOTIDE 145 MCG PO CAPS: 145.0000 ug | ORAL_CAPSULE | Freq: Every day | ORAL | 5 refills | Status: DC

## 2018-07-24 NOTE — Telephone Encounter (Signed)
Rx resent.

## 2018-07-24 NOTE — Telephone Encounter (Signed)
Noted. Pre-op appt 09/29/18 at 9:00am. Appt letter mailed with procedure instructions.

## 2018-07-24 NOTE — Telephone Encounter (Signed)
Half the night before and none the morning of.

## 2018-07-24 NOTE — Telephone Encounter (Signed)
Called and informed pt.  

## 2018-08-08 ENCOUNTER — Other Ambulatory Visit: Payer: Self-pay | Admitting: Family

## 2018-08-08 DIAGNOSIS — E039 Hypothyroidism, unspecified: Secondary | ICD-10-CM

## 2018-08-13 ENCOUNTER — Encounter: Payer: Self-pay | Admitting: Family

## 2018-08-13 ENCOUNTER — Ambulatory Visit (INDEPENDENT_AMBULATORY_CARE_PROVIDER_SITE_OTHER): Payer: Medicare Other | Admitting: Family

## 2018-08-13 ENCOUNTER — Other Ambulatory Visit: Payer: Self-pay

## 2018-08-13 VITALS — BP 114/61 | HR 59 | Temp 97.7°F | Ht 62.0 in | Wt 189.8 lb

## 2018-08-13 DIAGNOSIS — Z7901 Long term (current) use of anticoagulants: Secondary | ICD-10-CM

## 2018-08-13 DIAGNOSIS — I480 Paroxysmal atrial fibrillation: Secondary | ICD-10-CM

## 2018-08-13 DIAGNOSIS — R42 Dizziness and giddiness: Secondary | ICD-10-CM

## 2018-08-13 LAB — COAGUCHEK XS/INR WAIVED
INR: 3.8 — ABNORMAL HIGH (ref 0.9–1.1)
Prothrombin Time: 45.2 s

## 2018-08-13 MED ORDER — MECLIZINE HCL 25 MG PO TABS: 25.0000 mg | ORAL_TABLET | Freq: Three times a day (TID) | ORAL | 0 refills | Status: DC | PRN

## 2018-08-13 NOTE — Patient Instructions (Signed)
Vertigo  Vertigo is the feeling that you or your surroundings are moving when they are not. Vertigo can be dangerous if it occurs while you are doing something that could endanger you or others, such as driving.  What are the causes?  This condition is caused by a disturbance in the signals that are sent by your body’s sensory systems to your brain. Different causes of a disturbance can lead to vertigo, including:  · Infections, especially in the inner ear.  · A bad reaction to a drug, or misuse of alcohol and medicines.  · Withdrawal from drugs or alcohol.  · Quickly changing positions, as when lying down or rolling over in bed.  · Migraine headaches.  · Decreased blood flow to the brain.  · Decreased blood pressure.  · Increased pressure in the brain from a head or neck injury, stroke, infection, tumor, or bleeding.  · Central nervous system disorders.  What are the signs or symptoms?  Symptoms of this condition usually occur when you move your head or your eyes in different directions. Symptoms may start suddenly, and they usually last for less than a minute. Symptoms may include:  · Loss of balance and falling.  · Feeling like you are spinning or moving.  · Feeling like your surroundings are spinning or moving.  · Nausea and vomiting.  · Blurred vision or double vision.  · Difficulty hearing.  · Slurred speech.  · Dizziness.  · Involuntary eye movement (nystagmus).  Symptoms can be mild and cause only slight annoyance, or they can be severe and interfere with daily life. Episodes of vertigo may return (recur) over time, and they are often triggered by certain movements. Symptoms may improve over time.  How is this diagnosed?  This condition may be diagnosed based on medical history and the quality of your nystagmus. Your health care provider may test your eye movements by asking you to quickly change positions to trigger the nystagmus. This may be called the Dix-Hallpike test, head thrust test, or roll test. You  may be referred to a health care provider who specializes in ear, nose, and throat (ENT) problems (otolaryngologist) or a provider who specializes in disorders of the central nervous system (neurologist).  You may have additional testing, including:  · A physical exam.  · Blood tests.  · MRI.  · A CT scan.  · An electrocardiogram (ECG). This records electrical activity in your heart.  · An electroencephalogram (EEG). This records electrical activity in your brain.  · Hearing tests.  How is this treated?  Treatment for this condition depends on the cause and the severity of the symptoms. Treatment options include:  · Medicines to treat nausea or vertigo. These are usually used for severe cases. Some medicines that are used to treat other conditions may also reduce or eliminate vertigo symptoms. These include:  ? Medicines that control allergies (antihistamines).  ? Medicines that control seizures (anticonvulsants).  ? Medicines that relieve depression (antidepressants).  ? Medicines that relieve anxiety (sedatives).  · Head movements to adjust your inner ear back to normal. If your vertigo is caused by an ear problem, your health care provider may recommend certain movements to correct the problem.  · Surgery. This is rare.  Follow these instructions at home:  Safety  · Move slowly.Avoid sudden body or head movements.  · Avoid driving.  · Avoid operating heavy machinery.  · Avoid doing any tasks that would cause danger to you or others if   you would have a vertigo episode during the task.  · If you have trouble walking or keeping your balance, try using a cane for stability. If you feel dizzy or unstable, sit down right away.  · Return to your normal activities as told by your health care provider. Ask your health care provider what activities are safe for you.  General instructions  · Take over-the-counter and prescription medicines only as told by your health care provider.  · Avoid certain positions or movements as  told by your health care provider.  · Drink enough fluid to keep your urine clear or pale yellow.  · Keep all follow-up visits as told by your health care provider. This is important.  Contact a health care provider if:  · Your medicines do not relieve your vertigo or they make it worse.  · You have a fever.  · Your condition gets worse or you develop new symptoms.  · Your family or friends notice any behavioral changes.  · Your nausea or vomiting gets worse.  · You have numbness or a “pins and needles” sensation in part of your body.  Get help right away if:  · You have difficulty moving or speaking.  · You are always dizzy.  · You faint.  · You develop severe headaches.  · You have weakness in your hands, arms, or legs.  · You have changes in your hearing or vision.  · You develop a stiff neck.  · You develop sensitivity to light.  This information is not intended to replace advice given to you by your health care provider. Make sure you discuss any questions you have with your health care provider.  Document Released: 01/03/2005 Document Revised: 09/07/2015 Document Reviewed: 07/19/2014  Elsevier Interactive Patient Education © 2019 [employer]

## 2018-08-13 NOTE — Progress Notes (Signed)
Subjective:    Patient ID: Rhonda Garcia, female    DOB: July 18, 1940, 78 y.o.   MRN: [id number]  Chief Complaint  Patient presents with  . protime recheck  . Dizziness    needs medicine    Dizziness  This is a recurrent problem. The current episode started 1 to 4 weeks ago. The problem occurs intermittently. The problem has been waxing and waning. Associated symptoms include vertigo. Pertinent negatives include no chills, congestion, coughing or weakness.  A FIb  Takes warfarin daily. See anticoagulation flow sheet.     Review of Systems  Constitutional: Negative for chills.  HENT: Negative for congestion.   Respiratory: Negative for cough.   Neurological: Positive for dizziness and vertigo. Negative for weakness.  All other systems reviewed and are negative.      Objective:   Physical Exam Vitals signs reviewed.  Constitutional:      General: She is not in acute distress.    Appearance: She is well-developed.  HENT:     Head: Normocephalic and atraumatic.     Right Ear: Tympanic membrane normal.     Left Ear: Tympanic membrane normal.  Eyes:     Pupils: Pupils are equal, round, and reactive to light.  Neck:     Musculoskeletal: Normal range of motion and neck supple.     Thyroid: No thyromegaly.  Cardiovascular:     Rate and Rhythm: Normal rate and regular rhythm.     Heart sounds: Normal heart sounds. No murmur.  Pulmonary:     Effort: Pulmonary effort is normal. No respiratory distress.     Breath sounds: Normal breath sounds. No wheezing.  Abdominal:     General: Bowel sounds are normal. There is no distension.     Palpations: Abdomen is soft.     Tenderness: There is no abdominal tenderness.  Musculoskeletal: Normal range of motion.        General: No tenderness.  Skin:    General: Skin is warm and dry.  Neurological:     Mental Status: She is alert and oriented to person, place, and time.     Cranial Nerves: No cranial nerve deficit.     Deep Tendon  Reflexes: Reflexes are normal and symmetric.  Psychiatric:        Behavior: Behavior normal.        Thought Content: Thought content normal.        Judgment: Judgment normal.      BP 114/61   Pulse (!) 59   Temp 97.7 F (36.5 C) (Oral)   Ht 5\' 2"  (1.575 m)   Wt 189 lb 12.8 oz (86.1 kg)   BMI 34.71 kg/m       Assessment & Plan:  Rhonda Garcia comes in today with chief complaint of protime recheck and Dizziness (needs medicine)   Diagnosis and orders addressed:  1. Paroxysmal atrial fibrillation (HCC) Description   INR today 3.8 (goal is 2-3)  Too high.   Hold today's dose, then start taking 2.5 mg (1/2 tablet)  on Monday & Fridays  taking 5 mg (1 tablet) all other days.     - CoaguChek XS/INR Waived  2. Long term current use of anticoagulant therapy - CoaguChek XS/INR Waived  3. Vertigo Fall preventions discussed Antivert as needed Avoid fast position changes - meclizine (ANTIVERT) 25 MG tablet; Take 1 tablet (25 mg total) by mouth 3 (three) times daily as needed for dizziness.  Dispense: 30 tablet; Refill: 0  Evelina Dun, FNP

## 2018-08-26 ENCOUNTER — Ambulatory Visit (INDEPENDENT_AMBULATORY_CARE_PROVIDER_SITE_OTHER): Payer: Medicare Other | Admitting: Physician Assistant

## 2018-08-26 ENCOUNTER — Encounter: Payer: Self-pay | Admitting: Physician Assistant

## 2018-08-26 ENCOUNTER — Other Ambulatory Visit: Payer: Self-pay

## 2018-08-26 DIAGNOSIS — I25110 Atherosclerotic heart disease of native coronary artery with unstable angina pectoris: Secondary | ICD-10-CM

## 2018-08-26 DIAGNOSIS — R0789 Other chest pain: Secondary | ICD-10-CM | POA: Diagnosis not present

## 2018-08-26 MED ORDER — TIZANIDINE HCL 2 MG PO TABS: 2.0000 mg | ORAL_TABLET | Freq: Three times a day (TID) | ORAL | 0 refills | Status: DC | PRN

## 2018-08-26 MED ORDER — PREDNISONE 10 MG (21) PO TBPK: ORAL_TABLET | ORAL | 0 refills | Status: DC

## 2018-08-26 NOTE — Progress Notes (Signed)
Telephone visit  Subjective: CC: Chest wall pain PCP: Sharion Balloon, FNP Rhonda Garcia is a 78 y.o. female calls for telephone consult today. Patient provides verbal consent for consult held via phone.  Patient is identified with 2 separate identifiers.  At this time the entire area is on COVID-19 social distancing and stay home orders are in place.  Patient is of higher risk and therefore we are performing this by a virtual method.  Location of patient: Home has Location of provider: [facility] Others present for call: Husband    Patient reports for about 1 week she has had some pain that is from the area of her breast around to under her shoulder blade.  It is made worse with movement.  It is made worse with coughing.  She has not been coughing excessively so I do not think it is an injured muscle or rib from coughing.  She does have known arthritis.  She does have known heart disease and is unable to take anti-inflammatories.  She is she is not really taking anything for this.  She states that nothing really changes it.  She can sit in any position it does not change it.  It does not get worse with walking.  She occasionally has some shortness of breath with walking but that is been a long-term problem.  She states her blood pressure was up slightly this morning.  However she rechecked it while on the telephone with me and was 127/73.  And her pulse was 88.  She did take a nitroglycerin with me and it really did not make a difference.  At this time I do not feel that it is cardiac involvement however I have talked to her for a good amount of time that if anything changes or she develops any other cardiac symptoms she is to call 911 and go to the hospital.  I am sending prescriptions in for prednisone and Zanaflex (this is covered by her insurance).  She will try to get it started today.  We will give her a call back in the next day or so to see how she is doing.  She also denies  any fever or rash at this time.  ROS: Per HPI  Allergies  Allergen Reactions  . Promethazine Hcl Anaphylaxis   Past Medical History:  Diagnosis Date  . A-fib ([place])   . Anal fissure    resolved   . Anemia   . Back pain    with left radiculopathy  . CAD (coronary artery disease)   . Cataract   . DDD (degenerative disc disease)   . Depression   . Diabetes mellitus   . Diverticulosis   . Dyslipidemia   . Dysrhythmia    AFib  . Esophagitis   . GERD (gastroesophageal reflux disease)   . Hiatal hernia   . Hx of peptic ulcer   . Hyperlipidemia   . Hypertension   . Hypothyroidism   . Internal hemorrhoids 2017  . NSTEMI (non-ST elevated myocardial infarction) ([place]) 08/05/2015  . Osteopenia   . Sleep apnea    not using CPAP now, could not tolerate and PCP aware.  . Thyroid disease     Current Outpatient Medications:  .  ALPRAZolam (XANAX) 0.5 MG tablet, Take 1 tablet (0.5 mg total) by mouth at bedtime as needed., Disp: 30 tablet, Rfl: 4 .  amLODipine (NORVASC) 5 MG tablet, Take 1 tablet (5 mg total) by mouth  daily., Disp: 90 tablet, Rfl: 3 .  escitalopram (LEXAPRO) 10 MG tablet, Take 1 tablet (10 mg total) by mouth daily., Disp: 90 tablet, Rfl: 3 .  fluticasone (FLONASE) 50 MCG/ACT nasal spray, Place 2 sprays into both nostrils daily., Disp: 16 g, Rfl: 6 .  isosorbide mononitrate (IMDUR) 60 MG 24 hr tablet, TAKE ONE (1) TABLET EACH DAY, Disp: 90 tablet, Rfl: 3 .  JANUVIA 100 MG tablet, TAKE ONE (1) TABLET EACH DAY, Disp: 90 tablet, Rfl: 0 .  levothyroxine (SYNTHROID) 50 MCG tablet, TAKE ONE (1) TABLET EACH DAY, Disp: 90 tablet, Rfl: 3 .  linaclotide (LINZESS) 145 MCG CAPS capsule, Take 1 capsule (145 mcg total) by mouth daily before breakfast., Disp: 30 capsule, Rfl: 5 .  lisinopril-hydrochlorothiazide (PRINZIDE,ZESTORETIC) 20-12.5 MG tablet, Take 2 tablets by mouth daily., Disp: 180 tablet, Rfl: 6 .  meclizine (ANTIVERT) 25 MG tablet, Take 1 tablet (25 mg total) by mouth 3  (three) times daily as needed for dizziness., Disp: 30 tablet, Rfl: 0 .  metFORMIN (GLUCOPHAGE) 1000 MG tablet, TAKE ONE TABLET TWICE A DAY WITH FOOD, Disp: 180 tablet, Rfl: 0 .  metoprolol tartrate (LOPRESSOR) 50 MG tablet, TAKE ONE TABLET BY MOUTH TWICE DAILY, Disp: 180 tablet, Rfl: 1 .  nitroGLYCERIN (NITROSTAT) 0.4 MG SL tablet, Place 1 tablet (0.4 mg total) under the tongue every 5 (five) minutes as needed for chest pain., Disp: 20 tablet, Rfl: 1 .  pantoprazole (PROTONIX) 40 MG tablet, TAKE ONE TABLET BY MOUTH TWICE DAILY, Disp: 180 tablet, Rfl: 0 .  polyethylene glycol-electrolytes (TRILYTE) 420 g solution, Take 4,000 mLs by mouth as directed., Disp: 4000 mL, Rfl: 0 .  pravastatin (PRAVACHOL) 80 MG tablet, Take 1 tablet (80 mg total) by mouth every evening. NEED OV., Disp: 90 tablet, Rfl: 2 .  predniSONE (STERAPRED UNI-PAK 21 TAB) 10 MG (21) TBPK tablet, As directed x 6 days, Disp: 21 tablet, Rfl: 0 .  spironolactone (ALDACTONE) 25 MG tablet, TAKE ONE (1) TABLET EACH DAY, Disp: 90 tablet, Rfl: 2 .  tiZANidine (ZANAFLEX) 2 MG tablet, Take 1 tablet (2 mg total) by mouth every 8 (eight) hours as needed for muscle spasms., Disp: 60 tablet, Rfl: 0 .  traMADol (ULTRAM) 50 MG tablet, Take 1 tablet (50 mg total) by mouth every 12 (twelve) hours as needed., Disp: 60 tablet, Rfl: 5 .  Vitamin D, Ergocalciferol, (DRISDOL) 50000 units CAPS capsule, TAKE 1 CAPSULE EVERY 7 DAYS, Disp: 12 capsule, Rfl: 0 .  warfarin (COUMADIN) 5 MG tablet, TAKE 1 TO 1&1/2 TABLET DAILY AS DIRECTED, Disp: 135 tablet, Rfl: 1  Assessment/ Plan: 78 y.o. female   1. Left-sided chest wall pain  - tiZANidine (ZANAFLEX) 2 MG tablet; Take 1 tablet (2 mg total) by mouth every 8 (eight) hours as needed for muscle spasms.  Dispense: 60 tablet; Refill: 0 - predniSONE (STERAPRED UNI-PAK 21 TAB) 10 MG (21) TBPK tablet; As directed x 6 days  Dispense: 21 tablet; Refill: 0  2. Coronary artery disease involving native coronary artery of  native heart with unstable angina pectoris [facility]) Patient instructed go to the hospital if any other cardiac symptoms start Follow with cardiology   Start time: 4:51 PM End time: 5:04 PM  Meds ordered this encounter  Medications  . tiZANidine (ZANAFLEX) 2 MG tablet    Sig: Take 1 tablet (2 mg total) by mouth every 8 (eight) hours as needed for muscle spasms.    Dispense:  60 tablet    Refill:  0  Order Specific Question:   Supervising Provider    Answer:   Janora Norlander [[id number]]  . predniSONE (STERAPRED UNI-PAK 21 TAB) 10 MG (21) TBPK tablet    Sig: As directed x 6 days    Dispense:  21 tablet    Refill:  0    Order Specific Question:   Supervising Provider    Answer:   Janora Norlander [[id number]]    Particia Nearing PA-C [place] [phone]

## 2018-08-27 ENCOUNTER — Other Ambulatory Visit: Payer: Self-pay

## 2018-08-28 ENCOUNTER — Other Ambulatory Visit: Payer: Self-pay

## 2018-08-29 ENCOUNTER — Encounter: Payer: Self-pay | Admitting: Family

## 2018-08-29 ENCOUNTER — Ambulatory Visit (INDEPENDENT_AMBULATORY_CARE_PROVIDER_SITE_OTHER): Payer: Medicare Other | Admitting: Family

## 2018-08-29 VITALS — BP 136/70 | HR 59 | Temp 97.3°F | Ht 62.0 in | Wt 191.8 lb

## 2018-08-29 DIAGNOSIS — Z7901 Long term (current) use of anticoagulants: Secondary | ICD-10-CM | POA: Diagnosis not present

## 2018-08-29 DIAGNOSIS — R0789 Other chest pain: Secondary | ICD-10-CM

## 2018-08-29 DIAGNOSIS — I480 Paroxysmal atrial fibrillation: Secondary | ICD-10-CM

## 2018-08-29 LAB — COAGUCHEK XS/INR WAIVED
INR: 1.9 — ABNORMAL HIGH (ref 0.9–1.1)
Prothrombin Time: 23.4 s

## 2018-08-29 MED ORDER — VITAMIN D (ERGOCALCIFEROL) 1.25 MG (50000 UNIT) PO CAPS: ORAL_CAPSULE | ORAL | 0 refills | Status: DC

## 2018-08-29 MED ORDER — NITROGLYCERIN 0.4 MG SL SUBL: 0.4000 mg | SUBLINGUAL_TABLET | SUBLINGUAL | 1 refills | Status: DC | PRN

## 2018-08-29 NOTE — Progress Notes (Signed)
Subjective:    Patient ID: Rhonda Garcia, female    DOB: 01/26/1941, 78 y.o.   MRN: [id number]  Chief Complaint  Patient presents with  . Medical Management of Chronic Issues   PT presents to the office today to recheck INR. See anticoagulation flow sheet.  She is currently taking warfarin for A Fib.   Pt had a virtual visit on 08/26/18 with complaints of chest wall pain. She was given prednisone and Zanaflex. States these helped her, but states "when they wear off the pain is back". She reports she is under a great deal of stress caring for her husband and states she moved her bedroom furniture including picking up her mattress then 3-4 days later  this pain started.   Chest Pain   This is a recurrent problem. The current episode started 1 to 4 weeks ago. The onset quality is sudden. The problem occurs intermittently. The problem has been waxing and waning. The pain is present in the substernal region. The pain is at a severity of 5/10. The pain is mild. The quality of the pain is described as sharp. The pain does not radiate. Pertinent negatives include no back pain, dizziness, malaise/fatigue or shortness of breath.      Review of Systems  Constitutional: Negative for malaise/fatigue.  Respiratory: Negative for shortness of breath.   Cardiovascular: Positive for chest pain.  Musculoskeletal: Negative for back pain.  Neurological: Negative for dizziness.  All other systems reviewed and are negative.      Objective:   Physical Exam Vitals signs reviewed.  Constitutional:      General: She is not in acute distress.    Appearance: She is well-developed.  HENT:     Head: Normocephalic and atraumatic.     Right Ear: Tympanic membrane normal.     Left Ear: Tympanic membrane normal.  Eyes:     Pupils: Pupils are equal, round, and reactive to light.  Neck:     Musculoskeletal: Normal range of motion and neck supple.     Thyroid: No thyromegaly.  Cardiovascular:     Rate and  Rhythm: Normal rate and regular rhythm.     Heart sounds: Normal heart sounds. No murmur.  Pulmonary:     Effort: Pulmonary effort is normal. No respiratory distress.     Breath sounds: Normal breath sounds. No wheezing.  Abdominal:     General: Bowel sounds are normal. There is no distension.     Palpations: Abdomen is soft.     Tenderness: There is no abdominal tenderness.  Musculoskeletal: Normal range of motion.        General: No tenderness.  Skin:    General: Skin is warm and dry.  Neurological:     Mental Status: She is alert and oriented to person, place, and time.     Cranial Nerves: No cranial nerve deficit.     Deep Tendon Reflexes: Reflexes are normal and symmetric.  Psychiatric:        Behavior: Behavior normal.        Thought Content: Thought content normal.        Judgment: Judgment normal.     BP 136/70   Pulse (!) 59   Temp (!) 97.3 F (36.3 C) (Oral)   Ht 5\' 2"  (1.575 m)   Wt 191 lb 12.8 oz (87 kg)   BMI 35.08 kg/m       Assessment & Plan:  LUCILIA YANNI comes in today with chief complaint  of Medical Management of Chronic Issues   Diagnosis and orders addressed:  1. Paroxysmal atrial fibrillation (HCC) Description   INR today 1.9 (goal is 2-3)  Too thick.   Since you missed one dose we will continue the current dose then start taking 2.5 mg (1/2 tablet)  on Monday & Fridays  taking 5 mg (1 tablet) all other days.       2. Long term current use of anticoagulant therapy  3. Chest wall pain EKG stable Based on physical exam it looks like MSK. Continue Zanaflex as needed  Call Cardiologists and keep follow up If pain worsens or develops SOB go to ED - EKG 12-Lead   Evelina Dun, FNP

## 2018-08-29 NOTE — Patient Instructions (Signed)

## 2018-08-29 NOTE — Addendum Note (Signed)
Addended by: Pollyann Kennedy F on: 08/29/2018 01:57 PM   Modules accepted: Orders

## 2018-09-04 ENCOUNTER — Other Ambulatory Visit: Payer: Self-pay

## 2018-09-04 ENCOUNTER — Emergency Department (HOSPITAL_COMMUNITY): Payer: Medicare Other

## 2018-09-04 ENCOUNTER — Encounter (HOSPITAL_COMMUNITY): Payer: Self-pay | Admitting: Emergency Medicine

## 2018-09-04 ENCOUNTER — Emergency Department (HOSPITAL_COMMUNITY)
Admission: EM | Admit: 2018-09-04 | Discharge: 2018-09-04 | Disposition: A | Discharge status: Home / Self Care | Payer: Medicare Other | Attending: Emergency Medicine | Admitting: Emergency Medicine

## 2018-09-04 ENCOUNTER — Telehealth: Payer: Self-pay | Admitting: Cardiology

## 2018-09-04 DIAGNOSIS — I2510 Atherosclerotic heart disease of native coronary artery without angina pectoris: Secondary | ICD-10-CM | POA: Insufficient documentation

## 2018-09-04 DIAGNOSIS — E119 Type 2 diabetes mellitus without complications: Secondary | ICD-10-CM | POA: Insufficient documentation

## 2018-09-04 DIAGNOSIS — I10 Essential (primary) hypertension: Secondary | ICD-10-CM | POA: Diagnosis not present

## 2018-09-04 DIAGNOSIS — I252 Old myocardial infarction: Secondary | ICD-10-CM | POA: Insufficient documentation

## 2018-09-04 DIAGNOSIS — E039 Hypothyroidism, unspecified: Secondary | ICD-10-CM | POA: Insufficient documentation

## 2018-09-04 DIAGNOSIS — Z7984 Long term (current) use of oral hypoglycemic drugs: Secondary | ICD-10-CM | POA: Diagnosis not present

## 2018-09-04 DIAGNOSIS — Z7901 Long term (current) use of anticoagulants: Secondary | ICD-10-CM | POA: Insufficient documentation

## 2018-09-04 DIAGNOSIS — N179 Acute kidney failure, unspecified: Secondary | ICD-10-CM | POA: Diagnosis not present

## 2018-09-04 DIAGNOSIS — R079 Chest pain, unspecified: Secondary | ICD-10-CM | POA: Diagnosis not present

## 2018-09-04 DIAGNOSIS — R0602 Shortness of breath: Secondary | ICD-10-CM | POA: Diagnosis not present

## 2018-09-04 LAB — BASIC METABOLIC PANEL
Anion gap: 9 (ref 5–15)
BUN: 43 mg/dL — ABNORMAL HIGH (ref 8–23)
CO2: 23 mmol/L (ref 22–32)
Calcium: 9.3 mg/dL (ref 8.9–10.3)
Chloride: 105 mmol/L (ref 98–111)
Creatinine, Ser: 1.71 mg/dL — ABNORMAL HIGH (ref 0.44–1.00)
GFR calc Af Amer: 33 mL/min — ABNORMAL LOW (ref >60)
GFR calc non Af Amer: 28 mL/min — ABNORMAL LOW (ref >60)
Glucose, Bld: 158 mg/dL — ABNORMAL HIGH (ref 70–99)
Potassium: 3.7 mmol/L (ref 3.5–5.1)
Sodium: 137 mmol/L (ref 135–145)

## 2018-09-04 LAB — TROPONIN I
Troponin I: <0.03 ng/mL (ref <0.03)
Troponin I: <0.03 ng/mL (ref <0.03)

## 2018-09-04 LAB — CBC
HCT: 41.3 % (ref 36.0–46.0)
Hemoglobin: 13.1 g/dL (ref 12.0–15.0)
MCH: 28.9 pg (ref 26.0–34.0)
MCHC: 31.7 g/dL (ref 30.0–36.0)
MCV: 91 fL (ref 80.0–100.0)
Platelets: 163 10*3/uL (ref 150–400)
RBC: 4.54 MIL/uL (ref 3.87–5.11)
RDW: 16.1 % — ABNORMAL HIGH (ref 11.5–15.5)
WBC: 9.7 10*3/uL (ref 4.0–10.5)
nRBC: 0 % (ref 0.0–0.2)

## 2018-09-04 LAB — PROTIME-INR
INR: 4.3 (ref 0.8–1.2)
Prothrombin Time: 40.4 seconds — ABNORMAL HIGH (ref 11.4–15.2)

## 2018-09-04 MED ORDER — SODIUM CHLORIDE 0.9% FLUSH
3.0000 mL | Freq: Once | INTRAVENOUS | Status: AC
  Administered 2018-09-04: 3 mL via INTRAVENOUS

## 2018-09-04 MED ORDER — SODIUM CHLORIDE 0.9 % IV BOLUS
1000.0000 mL | Freq: Once | INTRAVENOUS | Status: AC
  Administered 2018-09-04: 1000 mL via INTRAVENOUS

## 2018-09-04 NOTE — Telephone Encounter (Signed)
Message received from Triage. I reviewed chart and returned patient call.  CP has been occurring for 2 weeks located substernal with radiation to her shoulder blades. PCP thought this is related to her recent breast surgery and prescribed her zanaflex and prednisone. Neither of these have helped her pain.  Her main complaint is fatigue, nausea, dizziness, pre-syncope, and low appetite. She is anxious and tearful. However, after careful questioning, I think she may be having stable angina. She describes CP that waxes and wanes and lasts for about 10 min. The chest pain occurs when she is doing house chores and subsides with rest. The CP also wakes her up in the middle of the night and she feels her heart pounding. She reports associated shortness of breath and nausea when the CP occurs. She also agrees this CP is similar to her prior angina.  I think she may be best served with an evaluation at ER. She will present to [facility] for further workup.  I will route this message to Dr. Percival Spanish.

## 2018-09-04 NOTE — ED Notes (Signed)
Date and time results received: 09/04/18 6:00 PM (use smartphrase ".now" to insert current time)  Test: INR Critical Value: 4.3  Name of Provider Notified: [place]  Orders Received? Or Actions Taken?: Orders Received - See Orders for details

## 2018-09-04 NOTE — Discharge Instructions (Signed)
Your INR today was 4.3. Do not take coumadin/warfarin tonight and then resume again tomorrow as prescribed.  Do not take prinzide (lisinopril-hydrochlorothiazide) or spirono lactone for three days and then resume taking it again on Monday. Your kidney function is a little bit off and consistent with mild dehydration. Holding these medicine should help. Your blood pressure today is good. Drink a little more water than you normally would.   Dr Hochrein's office should be calling you to arrange a time for them to see you in the office.   Return to the ER immediately if you feel like symptoms are worsening.

## 2018-09-04 NOTE — ED Triage Notes (Signed)
Patient complains of chest pain that comes and goes x 3wks.  Patient complains of lethargy and loss of appetite. Patient has 2 cardiac stents and 2 Mis in the past.

## 2018-09-04 NOTE — ED Notes (Signed)
ED Provider at bedside. 

## 2018-09-04 NOTE — Telephone Encounter (Signed)
Spoke with pt. She report she has been experiencing left shoulder pain for the past 3 weeks but had a breast reduction and pcp feels it could be related to that. Pt also state she's been having chest pain off and on but symptoms has improved and are not happening as frequent. Pt report since the beginning of the weak she's been very exhausted, swimmy headed and no energy. She state she can barely walk to her mailbox. Pt also report feeling very nervous and crying a lot. Pt BP this morning was 92/61 HR 64  Will route to Triage PA for recommendations.

## 2018-09-04 NOTE — ED Provider Notes (Signed)
[facility] EMERGENCY DEPARTMENT Provider Note   CSN: [id number] Arrival date & time: 09/04/18  1507    History   Chief Complaint Chief Complaint  Patient presents with  . Chest Pain    HPI Rhonda Garcia is a 78 y.o. female.     HPI  78yF with CP. Intermittent for the past 2-3 weeks. Sometimes sharp and sometimes dull/pressure in the center of her chest and L shoulder blade. Episodes last 5-10 minutes and then ease off. Brought on by activity such as walking for more than short distance or house cleaning. Improved with rest. Has had a couple of occasions where she has awakened from sleep with it and heart was pounding. She has also had mild nausea and less of an appetite than normal. She has felt unusually fatigued over this same time period.   Past Medical History:  Diagnosis Date  . A-fib ([place])   . Anal fissure    resolved   . Anemia   . Back pain    with left radiculopathy  . CAD (coronary artery disease)   . Cataract   . DDD (degenerative disc disease)   . Depression   . Diabetes mellitus   . Diverticulosis   . Dyslipidemia   . Dysrhythmia    AFib  . Esophagitis   . GERD (gastroesophageal reflux disease)   . Hiatal hernia   . Hx of peptic ulcer   . Hyperlipidemia   . Hypertension   . Hypothyroidism   . Internal hemorrhoids 2017  . NSTEMI (non-ST elevated myocardial infarction) ([place]) 08/05/2015  . Osteopenia   . Sleep apnea    not using CPAP now, could not tolerate and PCP aware.  . Thyroid disease     Patient Active Problem List   Diagnosis Date Noted  . Anemia 07/22/2018  . Shortness of breath 01/09/2017  . Morbid obesity ([place]) 11/05/2016  . Vertigo 06/27/2016  . GAD (generalized anxiety disorder) 10/27/2015  . Coronary artery disease involving native coronary artery of native heart with unstable angina pectoris ([place])   . NSTEMI (non-ST elevated myocardial infarction) ([place]) 08/05/2015  . Hx of adenomatous colonic polyps 06/22/2015  .  Hypothyroidism 01/17/2015  . Vitamin D deficiency 09/09/2014  . Hiatal hernia   . Dysphagia, pharyngoesophageal phase   . History of colonic polyps   . Diverticulosis of colon without hemorrhage   . Paroxysmal atrial fibrillation ([place]) 11/02/2013  . Long term current use of anticoagulant therapy 11/02/2013  . Depression 10/28/2012  . Type 2 diabetes mellitus with hyperglycemia ([place]) 10/28/2012  . Lipoma of back 02/12/2012  . Constipation 02/28/2011  . Esophageal dysphagia 02/28/2011  . Gastroesophageal reflux 02/28/2011  . POSITIONAL VERTIGO 04/29/2009  . Osteoarthritis 12/23/2008  . Hyperlipidemia associated with type 2 diabetes mellitus ([place]) 12/21/2008  . Hypertension associated with diabetes ([place]) 12/21/2008    Past Surgical History:  Procedure Laterality Date  . ABDOMINAL HYSTERECTOMY Bilateral 2001 - approximate  . APPENDECTOMY    . BREAST REDUCTION SURGERY    . CARDIAC CATHETERIZATION N/A 08/08/2015   Procedure: Left Heart Cath and Coronary Angiography;  Surgeon: Burnell Blanks, MD;  Location: [facility];  Service: Cardiovascular;  Laterality: N/A;  . CHOLECYSTECTOMY  2008 - approximate  . COLONOSCOPY  06/2007   Friable anal canal and anal papillae. Pancolonic diverticula. Normal terminal ileum. Status post segmental biopsy, descending colon biopsy showed minimal cryptitis. Biopsies felt to be  nonspecific but could be seen with NSAIDs. No features of  inflammatory bowel disease. Stool studies were negative.  . COLONOSCOPY  03/21/2011   RMR: colonic polyps treated as described above, colonic diverticulosis (Tubular adenomas)  . COLONOSCOPY WITH PROPOFOL N/A 06/17/2014   RMR: Colonic diverticulosis. Multiple colonic polyps removed as described above.   . COLONOSCOPY WITH PROPOFOL N/A 03/19/2016   Procedure: COLONOSCOPY WITH PROPOFOL;  Surgeon: Daneil Dolin, MD;  [facility];  Service: Endoscopy;  Laterality: N/A;  8:45am  . CORONARY ANGIOPLASTY WITH  STENT PLACEMENT     4 stents  . ESOPHAGEAL DILATION N/A 06/17/2014   Procedure: ESOPHAGEAL DILATION;  Surgeon: Daneil Dolin, MD;  Location: [facility];  Service: Endoscopy;  Laterality: N/A;  Maloney 87 Fr  . ESOPHAGOGASTRODUODENOSCOPY  06/2007   Small hiatal hernia  . ESOPHAGOGASTRODUODENOSCOPY  03/21/2011   RMR: normal esophagus- status post passage of Maloney dilator. Small hiatal hernia. Trivial antral and bulbar erosions  . ESOPHAGOGASTRODUODENOSCOPY (EGD) WITH PROPOFOL N/A 06/17/2014   RMR: Small hiatal hernia; otherwise normal EGD. Status post passage of a Maloney dilator. No explanation for patients right upper quadrant abdominal pain.   Marland Kitchen ESOPHAGOGASTRODUODENOSCOPY (EGD) WITH PROPOFOL N/A 03/19/2016   Procedure: ESOPHAGOGASTRODUODENOSCOPY (EGD) WITH PROPOFOL;  Surgeon: Daneil Dolin, MD;  [facility];  Service: Endoscopy;  Laterality: N/A;  . KNEE SURGERY Left    arthroscopy  . LIPOMA EXCISION  03/17/2012   Procedure: EXCISION LIPOMA;  Surgeon: Gwenyth Ober, MD;  Location: [facility];  Service: General;  Laterality: Right;  excision of right lower back lipoma  . MALONEY DILATION N/A 03/19/2016   Procedure: Venia Minks DILATION;  Surgeon: Daneil Dolin, MD;  [facility];  Service: Endoscopy;  Laterality: N/A;  . POLYPECTOMY  06/17/2014   Procedure: POLYPECTOMY;  Surgeon: Daneil Dolin, MD;  Location: [facility];  Service: Endoscopy;;     OB History   No obstetric history on file.      Home Medications    Prior to Admission medications   Medication Sig Start Date End Date Taking? Authorizing Provider  ALPRAZolam Duanne Moron) 0.5 MG tablet Take 1 tablet (0.5 mg total) by mouth at bedtime as needed. 03/20/18   Evelina Dun A, FNP  amLODipine (NORVASC) 5 MG tablet Take 1 tablet (5 mg total) by mouth daily. 05/16/18   Evelina Dun A, FNP  escitalopram (LEXAPRO) 10 MG tablet Take 1 tablet (10 mg total) by mouth daily. 01/10/17   Evelina Dun A, FNP   fluticasone (FLONASE) 50 MCG/ACT nasal spray Place 2 sprays into both nostrils daily. 06/12/18   Baruch Gouty, FNP  isosorbide mononitrate (IMDUR) 60 MG 24 hr tablet TAKE ONE (1) TABLET EACH DAY 04/07/18   Minus Breeding, MD  JANUVIA 100 MG tablet TAKE ONE (1) TABLET EACH DAY 04/04/18   Evelina Dun A, FNP  levothyroxine (SYNTHROID) 50 MCG tablet TAKE ONE (1) TABLET EACH DAY 08/08/18   Sharion Balloon, FNP  linaclotide [facility]) 145 MCG CAPS capsule Take 1 capsule (145 mcg total) by mouth daily before breakfast. 07/24/18   Carlis Stable, NP  lisinopril-hydrochlorothiazide (PRINZIDE,ZESTORETIC) 20-12.5 MG tablet Take 2 tablets by mouth daily. 04/17/18   Sharion Balloon, FNP  meclizine (ANTIVERT) 25 MG tablet Take 1 tablet (25 mg total) by mouth 3 (three) times daily as needed for dizziness. 08/13/18   Sharion Balloon, FNP  metFORMIN (GLUCOPHAGE) 1000 MG tablet TAKE ONE TABLET TWICE A DAY WITH FOOD 04/07/18   Sharion Balloon, FNP  metoprolol  tartrate (LOPRESSOR) 50 MG tablet TAKE ONE TABLET BY MOUTH TWICE DAILY 04/04/18   Evelina Dun A, FNP  nitroGLYCERIN (NITROSTAT) 0.4 MG SL tablet Place 1 tablet (0.4 mg total) under the tongue every 5 (five) minutes as needed for chest pain. 08/29/18   Evelina Dun A, FNP  pantoprazole (PROTONIX) 40 MG tablet TAKE ONE TABLET BY MOUTH TWICE DAILY 11/14/17   Evelina Dun A, FNP  polyethylene glycol-electrolytes (TRILYTE) 420 g solution Take 4,000 mLs by mouth as directed. 07/22/18   Rourk, Cristopher Estimable, MD  pravastatin (PRAVACHOL) 80 MG tablet Take 1 tablet (80 mg total) by mouth every evening. NEED OV. 04/17/18   Sharion Balloon, FNP  predniSONE (STERAPRED UNI-PAK 21 TAB) 10 MG (21) TBPK tablet As directed x 6 days 08/26/18   Terald Sleeper, PA-C  spironolactone (ALDACTONE) 25 MG tablet TAKE ONE (1) TABLET EACH DAY 04/17/18   Hawks, Christy A, FNP  tiZANidine (ZANAFLEX) 2 MG tablet Take 1 tablet (2 mg total) by mouth every 8 (eight) hours as needed for muscle spasms.  08/26/18   Terald Sleeper, PA-C  traMADol (ULTRAM) 50 MG tablet Take 1 tablet (50 mg total) by mouth every 12 (twelve) hours as needed. 05/30/18   Sharion Balloon, FNP  Vitamin D, Ergocalciferol, (DRISDOL) 1.25 MG (50000 UT) CAPS capsule TAKE 1 CAPSULE EVERY 7 DAYS 08/29/18   Evelina Dun A, FNP  warfarin (COUMADIN) 5 MG tablet TAKE 1 TO 1&1/2 TABLET DAILY AS DIRECTED 07/21/18   Sharion Balloon, FNP    Family History Family History  Problem Relation Age of Onset  . Heart attack Mother   . Hypertension Mother   . Stroke Mother        Deceased, age 57  . Diabetes Sister   . Diabetes Sister   . Cancer Sister        ovarian  . Stroke Sister   . Diabetes Brother        also has a blood disorder   . Clotting disorder Brother   . Kidney disease Brother   . Heart disease Brother   . Melanoma Brother        deceased  . Coronary artery disease Other   . Kidney disease Other   . Diabetes Son   . Hypertension Son   . Coronary artery disease Son 14       CABG  . Hypertension Son   . Coronary artery disease Son 29       CABG  . Colon cancer Neg Hx     Social History Social History   Tobacco Use  . Smoking status: Never Smoker  . Smokeless tobacco: Never Used  Substance Use Topics  . Alcohol use: Not Currently    Alcohol/week: 1.0 standard drinks    Types: 1 Glasses of wine per week  . Drug use: No     Allergies   Promethazine hcl   Review of Systems Review of Systems  All systems reviewed and negative, other than as noted in HPI.  Physical Exam Updated Vital Signs BP 117/65 (BP Location: Right Arm)   Pulse 74   Temp 98.1 F (36.7 C) (Oral)   Resp 16   Ht 5\' 2"  (1.575 m)   Wt 83.9 kg   SpO2 100%   BMI 33.84 kg/m   Physical Exam Vitals signs and nursing note reviewed.  Constitutional:      General: She is not in acute distress.    Appearance: She is  well-developed.  HENT:     Head: Normocephalic and atraumatic.  Eyes:     General:        Right eye: No  discharge.        Left eye: No discharge.     Conjunctiva/sclera: Conjunctivae normal.  Neck:     Musculoskeletal: Neck supple.  Cardiovascular:     Rate and Rhythm: Normal rate and regular rhythm.     Heart sounds: Normal heart sounds. No murmur. No friction rub. No gallop.   Pulmonary:     Effort: Pulmonary effort is normal. No respiratory distress.     Breath sounds: Normal breath sounds.  Abdominal:     General: There is no distension.     Palpations: Abdomen is soft.     Tenderness: There is no abdominal tenderness.  Musculoskeletal:        General: No tenderness.  Skin:    General: Skin is warm and dry.  Neurological:     Mental Status: She is alert.  Psychiatric:        Behavior: Behavior normal.        Thought Content: Thought content normal.      ED Treatments / Results  Labs (all labs ordered are listed, but only abnormal results are displayed) Labs Reviewed  BASIC METABOLIC PANEL - Abnormal; Notable for the following components:      Result Value   Glucose, Bld 158 (*)    BUN 43 (*)    Creatinine, Ser 1.71 (*)    GFR calc non Af Amer 28 (*)    GFR calc Af Amer 33 (*)    All other components within normal limits  CBC - Abnormal; Notable for the following components:   RDW 16.1 (*)    All other components within normal limits  PROTIME-INR - Abnormal; Notable for the following components:   Prothrombin Time 40.4 (*)    INR 4.3 (*)    All other components within normal limits  TROPONIN I  TROPONIN I    EKG EKG Interpretation  Date/Time:  Thursday Sep 04 2018 18:27:55 EDT Ventricular Rate:  59 PR Interval:    QRS Duration: 88 QT Interval:  384 QTC Calculation: 381 R Axis:   37 Text Interpretation:  Sinus rhythm Low voltage, precordial leads Confirmed by Virgel Manifold [phone]) on 09/04/2018 7:46:01 PM   Radiology Dg Chest 2 View  Result Date: 09/04/2018 CLINICAL DATA:  Chest pain, shortness of breath. EXAM: CHEST - 2 VIEW COMPARISON:  Radiographs  of December 30, 2016. FINDINGS: The heart size and mediastinal contours are within normal limits. Both lungs are clear. No pneumothorax or pleural effusion is noted. The visualized skeletal structures are unremarkable. IMPRESSION: No active cardiopulmonary disease. Electronically Signed   By: Marijo Conception M.D.   On: 09/04/2018 15:53    Procedures Procedures (including critical care time)  Medications Ordered in ED Medications  sodium chloride flush (NS) 0.9 % injection 3 mL (has no administration in time range)     Initial Impression / Assessment and Plan / ED Course  I have reviewed the triage vital signs and the nursing notes.  Pertinent labs & imaging results that were available during my care of the patient were reviewed by me and considered in my medical decision making (see chart for details).    78yF with CP. I suspect this is probably cardiac. Her description of symptoms seems most consistent with stable angina. They have been ongoing for ~3 weeks. Has been  getting them consistently but not significantly increasing in frequency or intensity. EKG with t-wave flattening laterally which is changed from one done with Dr Percival Spanish during her last visit.   She has had a couple episodes at night in bed but says she felt like her heart was racing/pounding during those times as well. She is in a sinus rhythm currently but has a hx of afib. This may potentially be demand ischemia if had RVR at these times.  Troponin is normal.  I think what she ultimately needs is provocative testing. I don't feel strongly about close outpt FU versus admission. I think either is reasonable but will discuss with cardiology.  AKI noted. Will give some IVF in the the ED today. Will have her hold aldactone and prinzide for a few days. Her BP is fine today. If she would end up having a LHC then they'd want her renal function better than this.  INR 4.3. Will have her hold her dose today if goes home.  7:31 PM  Discussed with Dr Harl Bowie. Will check another troponin. If remains normal then will get her into be seen expeditiously. Pt is comfortable with this plan. She understands that she needs to immediately return to the ED if she feels like symptoms are worsening in the mean time.   Care signed out to Dr Lacinda Axon with delta troponin pending. Plan for discharge if normal.   Final Clinical Impressions(s) / ED Diagnoses   Final diagnoses:  Chest pain, unspecified type  AKI (acute kidney injury) [facility])    ED Discharge Orders    None       Virgel Manifold, MD 09/04/18 1946

## 2018-09-04 NOTE — Telephone Encounter (Signed)
New Message   Pt c/o of Chest Pain: STAT if CP now or developed within 24 hours  1. Are you having CP right now? No  2. Are you experiencing any other symptoms (ex. SOB, nausea, vomiting, sweating)? Shoulder Pain, Fatigue, SOB, Lightheadedness, and Nausea   3. How long have you been experiencing CP? Over two weeks   4. Is your CP continuous or coming and going? Coming and going  5. Have you taken Nitroglycerin? Yes ?

## 2018-09-05 ENCOUNTER — Telehealth: Payer: Self-pay

## 2018-09-05 ENCOUNTER — Telehealth: Payer: Self-pay | Admitting: Cardiology

## 2018-09-05 DIAGNOSIS — R079 Chest pain, unspecified: Secondary | ICD-10-CM

## 2018-09-05 NOTE — Telephone Encounter (Signed)
Patient called, she just wanted to update her number on file to her husbands number as her cell phone is having issues- they are suppose to contact her to get set up for a visit with Dr.Hochrein- TOC call should be made.  Will route to scheduling to make sure they are aware.  Thanks!

## 2018-09-05 NOTE — Telephone Encounter (Signed)
New Message           Patient is calling because she is having trouble with her phone she gave this # to call her on [phone]. It's her husband #

## 2018-09-05 NOTE — Progress Notes (Signed)
Patient discussed with ER staff last night. Fairly stable exertional symptoms since onset, EKG and enzymes do not support ACS. We will plan for outpatient stress testing, she will be discharged from ER and we will arrange an outpatient lexiscan   Carlyle Dolly MD

## 2018-09-05 NOTE — Telephone Encounter (Signed)
Order placed

## 2018-09-05 NOTE — Telephone Encounter (Signed)
-----   Message from Arnoldo Lenis, MD sent at 09/05/2018 10:07 AM EDT ----- Can we set up a lexiscan for this patient early next week for chest pain. She was seen in ER with chest pain, patient of Dr Percival Spanish. Does not need to hold any meds   Zandra Abts MD

## 2018-09-09 ENCOUNTER — Ambulatory Visit (HOSPITAL_COMMUNITY)
Admission: RE | Admit: 2018-09-09 | Discharge: 2018-09-09 | Disposition: A | Discharge status: Home / Self Care | Payer: Medicare Other | Source: Ambulatory Visit | Attending: Cardiology | Admitting: Cardiology

## 2018-09-09 ENCOUNTER — Encounter (HOSPITAL_COMMUNITY): Admission: RE | Admit: 2018-09-09 | Payer: Medicare Other | Source: Ambulatory Visit

## 2018-09-09 ENCOUNTER — Telehealth: Payer: Self-pay | Admitting: Cardiology

## 2018-09-09 ENCOUNTER — Other Ambulatory Visit: Payer: Self-pay

## 2018-09-09 DIAGNOSIS — R079 Chest pain, unspecified: Secondary | ICD-10-CM

## 2018-09-09 NOTE — Telephone Encounter (Signed)
Patient was scheduled for stress test 09/09/2018. She drank coffee stating that she was never given instructions. Please call patient today and go over medications and instructions with her. She has been re-scheduled for 09/11/2018.

## 2018-09-09 NOTE — Telephone Encounter (Signed)
Called to go over stress test information. She does not need to hold any medications, she will not eat or drink after 8 pm Wednesday night.

## 2018-09-10 ENCOUNTER — Other Ambulatory Visit: Payer: Self-pay | Admitting: Family

## 2018-09-11 ENCOUNTER — Encounter (HOSPITAL_BASED_OUTPATIENT_CLINIC_OR_DEPARTMENT_OTHER)
Admission: RE | Admit: 2018-09-11 | Discharge: 2018-09-11 | Disposition: A | Discharge status: Home / Self Care | Payer: Medicare Other | Source: Ambulatory Visit | Attending: Cardiology | Admitting: Cardiology

## 2018-09-11 ENCOUNTER — Other Ambulatory Visit: Payer: Self-pay

## 2018-09-11 ENCOUNTER — Encounter (HOSPITAL_COMMUNITY)
Admission: RE | Admit: 2018-09-11 | Discharge: 2018-09-11 | Disposition: A | Discharge status: Home / Self Care | Payer: Medicare Other | Source: Ambulatory Visit | Attending: Cardiology | Admitting: Cardiology

## 2018-09-11 ENCOUNTER — Encounter (HOSPITAL_COMMUNITY): Payer: Self-pay

## 2018-09-11 DIAGNOSIS — R079 Chest pain, unspecified: Secondary | ICD-10-CM | POA: Insufficient documentation

## 2018-09-11 HISTORY — DX: Systemic involvement of connective tissue, unspecified: M35.9

## 2018-09-11 LAB — NM MYOCAR MULTI W/SPECT W/WALL MOTION / EF
LV dias vol: 58 mL (ref 46–106)
LV sys vol: 16 mL
Peak HR: 81 {beats}/min
RATE: 0.41
Rest HR: 60 {beats}/min
SDS: 0
SRS: 0
SSS: 0
TID: 1.15

## 2018-09-11 MED ORDER — TECHNETIUM TC 99M TETROFOSMIN IV KIT
10.0000 | PACK | Freq: Once | INTRAVENOUS | Status: AC | PRN
  Administered 2018-09-11: 10.4 via INTRAVENOUS

## 2018-09-11 MED ORDER — SODIUM CHLORIDE 0.9% FLUSH
INTRAVENOUS | Status: AC
  Administered 2018-09-11: 10 mL via INTRAVENOUS
  Filled 2018-09-11: qty 10

## 2018-09-11 MED ORDER — TECHNETIUM TC 99M TETROFOSMIN IV KIT
30.0000 | PACK | Freq: Once | INTRAVENOUS | Status: AC | PRN
  Administered 2018-09-11: 33 via INTRAVENOUS

## 2018-09-11 MED ORDER — REGADENOSON 0.4 MG/5ML IV SOLN
INTRAVENOUS | Status: AC
  Administered 2018-09-11: 0.4 mg via INTRAVENOUS
  Filled 2018-09-11: qty 5

## 2018-09-12 ENCOUNTER — Telehealth: Payer: Self-pay

## 2018-09-12 NOTE — Telephone Encounter (Signed)
-----   Message from Arnoldo Lenis, MD sent at 09/12/2018  2:51 PM EDT ----- Stress test looks good, no evidence of new blockages. Can she get PA f/u 2 weeks   J BrancH MD

## 2018-09-12 NOTE — Telephone Encounter (Signed)
Pt made aware. Copy to pcp.  

## 2018-09-25 ENCOUNTER — Encounter: Payer: Self-pay | Admitting: Physician Assistant

## 2018-09-25 ENCOUNTER — Other Ambulatory Visit: Payer: Self-pay

## 2018-09-25 ENCOUNTER — Ambulatory Visit (INDEPENDENT_AMBULATORY_CARE_PROVIDER_SITE_OTHER): Payer: Medicare Other | Admitting: Physician Assistant

## 2018-09-25 ENCOUNTER — Ambulatory Visit: Payer: Medicare Other

## 2018-09-25 VITALS — BP 109/62 | HR 67 | Temp 97.0°F | Ht 62.0 in | Wt 188.4 lb

## 2018-09-25 DIAGNOSIS — M1712 Unilateral primary osteoarthritis, left knee: Secondary | ICD-10-CM

## 2018-09-25 MED ORDER — HYDROCODONE-ACETAMINOPHEN 5-325 MG PO TABS: 1.0000 | ORAL_TABLET | ORAL | 0 refills | Status: DC | PRN

## 2018-09-26 ENCOUNTER — Encounter (HOSPITAL_COMMUNITY): Payer: Self-pay

## 2018-09-26 ENCOUNTER — Other Ambulatory Visit: Payer: Self-pay

## 2018-09-29 ENCOUNTER — Other Ambulatory Visit: Payer: Self-pay

## 2018-09-29 ENCOUNTER — Encounter: Payer: Self-pay | Admitting: Physician Assistant

## 2018-09-29 ENCOUNTER — Encounter (HOSPITAL_COMMUNITY)
Admission: RE | Admit: 2018-09-29 | Discharge: 2018-09-29 | Disposition: A | Discharge status: Home / Self Care | Payer: Medicare Other | Source: Ambulatory Visit | Attending: Internal Medicine | Admitting: Internal Medicine

## 2018-09-29 ENCOUNTER — Other Ambulatory Visit (HOSPITAL_COMMUNITY)
Admission: RE | Admit: 2018-09-29 | Discharge: 2018-09-29 | Disposition: A | Discharge status: Home / Self Care | Payer: Medicare Other | Source: Ambulatory Visit | Attending: Internal Medicine | Admitting: Internal Medicine

## 2018-09-29 DIAGNOSIS — Z1159 Encounter for screening for other viral diseases: Secondary | ICD-10-CM | POA: Insufficient documentation

## 2018-09-29 NOTE — Progress Notes (Signed)
BP 109/62   Pulse 67   Temp (!) 97 F (36.1 C) (Oral)   Ht 5\' 2"  (1.575 m)   Wt 188 lb 6.4 oz (85.5 kg)   BMI 34.46 kg/m    Subjective:    Patient ID: Rhonda Garcia, female    DOB: [birth year], 78 y.o.   MRN: [id number]  HPI: Rhonda Garcia is a 78 y.o. female presenting on 09/25/2018 for Knee Pain (left) and Joint Swelling  Which patient states that her left knee has been bothering her for a long of days.  She did not have any particular injury but just that she has been up and has had more and more swelling.  She has decreased range of motion.  She does have some known osteoarthritis.  She has had problems in the right knee also in the past.  She denies any fever, chills, sores, drainage.   Past Medical History:  Diagnosis Date  . A-fib ([place])   . Anal fissure    resolved   . Anemia   . Back pain    with left radiculopathy  . CAD (coronary artery disease)   . Cataract   . Collagen vascular disease ([place])   . DDD (degenerative disc disease)   . Depression   . Diabetes mellitus   . Diverticulosis   . Dyslipidemia   . Dysrhythmia    AFib  . Esophagitis   . GERD (gastroesophageal reflux disease)   . Hiatal hernia   . Hx of peptic ulcer   . Hyperlipidemia   . Hypertension   . Hypothyroidism   . Internal hemorrhoids 2017  . NSTEMI (non-ST elevated myocardial infarction) ([place]) 08/05/2015  . Osteopenia   . Sleep apnea    not using CPAP now, could not tolerate and PCP aware.  . Thyroid disease    Relevant past medical, surgical, family and social history reviewed and updated as indicated. Interim medical history since our last visit reviewed. Allergies and medications reviewed and updated. DATA REVIEWED: CHART IN EPIC  Family History reviewed for pertinent findings.  Review of Systems  Constitutional: Negative.   HENT: Negative.   Eyes: Negative.   Respiratory: Negative.   Gastrointestinal: Negative.   Genitourinary: Negative.   Musculoskeletal: Positive for  arthralgias, gait problem and joint swelling.    Allergies as of 09/25/2018      Reactions   Promethazine Hcl Anaphylaxis      Medication List       Accurate as of September 25, 2018 11:59 PM. If you have any questions, ask your nurse or doctor.        STOP taking these medications   predniSONE 10 MG (21) Tbpk tablet Commonly known as: STERAPRED UNI-PAK 21 TAB Stopped by: Terald Sleeper, PA-C   predniSONE 10 MG tablet Commonly known as: DELTASONE Stopped by: Terald Sleeper, PA-C     TAKE these medications   acetaminophen 325 MG tablet Commonly known as: TYLENOL Take 325 mg by mouth every 6 (six) hours as needed for moderate pain.   ALPRAZolam 0.5 MG tablet Commonly known as: XANAX Take 1 tablet (0.5 mg total) by mouth at bedtime as needed.   amLODipine 5 MG tablet Commonly known as: NORVASC Take 1 tablet (5 mg total) by mouth daily.   escitalopram 10 MG tablet Commonly known as: Lexapro Take 1 tablet (10 mg total) by mouth daily.   fluticasone 50 MCG/ACT nasal spray Commonly known as: FLONASE Place 2 sprays into both nostrils  daily.   HYDROcodone-acetaminophen 5-325 MG tablet Commonly known as: Norco Take 1 tablet by mouth every 4 (four) hours as needed for moderate pain or severe pain. Started by: Terald Sleeper, PA-C   isosorbide mononitrate 60 MG 24 hr tablet Commonly known as: IMDUR TAKE ONE (1) TABLET EACH DAY What changed: See the new instructions.   levothyroxine 50 MCG tablet Commonly known as: SYNTHROID TAKE ONE (1) TABLET EACH DAY What changed: See the new instructions.   linaclotide 145 MCG Caps capsule Commonly known as: Linzess Take 1 capsule (145 mcg total) by mouth daily before breakfast.   lisinopril-hydrochlorothiazide 20-12.5 MG tablet Commonly known as: ZESTORETIC Take 2 tablets by mouth daily.   meclizine 25 MG tablet Commonly known as: ANTIVERT Take 1 tablet (25 mg total) by mouth 3 (three) times daily as needed for dizziness.    metFORMIN 1000 MG tablet Commonly known as: GLUCOPHAGE TAKE ONE TABLET TWICE A DAY WITH FOOD What changed: See the new instructions.   metoprolol tartrate 50 MG tablet Commonly known as: LOPRESSOR TAKE ONE TABLET BY MOUTH TWICE DAILY   nitroGLYCERIN 0.4 MG SL tablet Commonly known as: NITROSTAT Place 1 tablet (0.4 mg total) under the tongue every 5 (five) minutes as needed for chest pain.   pantoprazole 40 MG tablet Commonly known as: PROTONIX TAKE ONE TABLET BY MOUTH TWICE DAILY   polyethylene glycol-electrolytes 420 g solution Commonly known as: TriLyte Take 4,000 mLs by mouth as directed.   pravastatin 80 MG tablet Commonly known as: PRAVACHOL Take 1 tablet (80 mg total) by mouth every evening. NEED OV.   sitaGLIPtin 100 MG tablet Commonly known as: Januvia Take 1 tablet (100 mg total) by mouth daily. (Please make 3 mos June appt)   spironolactone 25 MG tablet Commonly known as: ALDACTONE TAKE ONE (1) TABLET EACH DAY What changed:   how much to take  how to take this  when to take this   tiZANidine 2 MG tablet Commonly known as: ZANAFLEX Take 1 tablet (2 mg total) by mouth every 8 (eight) hours as needed for muscle spasms. What changed: when to take this   traMADol 50 MG tablet Commonly known as: ULTRAM Take 1 tablet (50 mg total) by mouth every 12 (twelve) hours as needed.   Vitamin D (Ergocalciferol) 1.25 MG (50000 UT) Caps capsule Commonly known as: DRISDOL TAKE 1 CAPSULE EVERY 7 DAYS What changed:   how much to take  how to take this   warfarin 5 MG tablet Commonly known as: COUMADIN Take as directed by the anticoagulation clinic. If you are unsure how to take this medication, talk to your nurse or doctor. Original instructions: TAKE 1 TO 1&1/2 TABLET DAILY AS DIRECTED What changed: See the new instructions.          Objective:    BP 109/62   Pulse 67   Temp (!) 97 F (36.1 C) (Oral)   Ht 5\' 2"  (1.575 m)   Wt 188 lb 6.4 oz (85.5 kg)    BMI 34.46 kg/m   Allergies  Allergen Reactions  . Promethazine Hcl Anaphylaxis    Wt Readings from Last 3 Encounters:  09/26/18 188 lb (85.3 kg)  09/25/18 188 lb 6.4 oz (85.5 kg)  09/04/18 185 lb (83.9 kg)    Physical Exam Constitutional:      Appearance: She is well-developed.  HENT:     Head: Normocephalic and atraumatic.  Eyes:     Conjunctiva/sclera: Conjunctivae normal.  Pupils: Pupils are equal, round, and reactive to light.  Cardiovascular:     Rate and Rhythm: Normal rate and regular rhythm.     Heart sounds: Normal heart sounds.  Pulmonary:     Effort: Pulmonary effort is normal.  Musculoskeletal:     Left knee: She exhibits decreased range of motion and swelling. She exhibits no deformity. Tenderness found. Medial joint line tenderness noted.       Legs:  Skin:    General: Skin is warm and dry.     Findings: No rash.  Neurological:     Mental Status: She is alert and oriented to person, place, and time.     Deep Tendon Reflexes: Reflexes are normal and symmetric.  Psychiatric:        Behavior: Behavior normal.         Assessment & Plan:   1. Primary osteoarthritis of left knee Rest, ice - HYDROcodone-acetaminophen (NORCO) 5-325 MG tablet; Take 1 tablet by mouth every 4 (four) hours as needed for moderate pain or severe pain.  Dispense: 30 tablet; Refill: 0   Continue all other maintenance medications as listed above.  Follow up plan: No follow-ups on file.  Educational handout given for [place] PA-C [place] [address]  [place], [place] [address] [phone]   09/29/2018, 4:16 PM

## 2018-09-30 ENCOUNTER — Ambulatory Visit: Payer: Medicare Other | Admitting: Physician Assistant

## 2018-09-30 LAB — NOVEL CORONAVIRUS, NAA (HOSP ORDER, SEND-OUT TO REF LAB; TAT 18-24 HRS): SARS-CoV-2, NAA: NOT DETECTED

## 2018-10-02 ENCOUNTER — Ambulatory Visit (HOSPITAL_COMMUNITY)
Admission: RE | Admit: 2018-10-02 | Discharge: 2018-10-02 | Disposition: A | Discharge status: Home / Self Care | Payer: Medicare Other | Attending: Internal Medicine | Admitting: Internal Medicine

## 2018-10-02 ENCOUNTER — Ambulatory Visit (HOSPITAL_COMMUNITY): Payer: Medicare Other | Admitting: Anesthesiology

## 2018-10-02 ENCOUNTER — Encounter (HOSPITAL_COMMUNITY): Payer: Self-pay | Admitting: *Deleted

## 2018-10-02 ENCOUNTER — Encounter (HOSPITAL_COMMUNITY)
Admission: RE | Disposition: A | Discharge status: Home / Self Care | Payer: Self-pay | Source: Home / Self Care | Attending: Internal Medicine

## 2018-10-02 DIAGNOSIS — D122 Benign neoplasm of ascending colon: Secondary | ICD-10-CM | POA: Diagnosis not present

## 2018-10-02 DIAGNOSIS — Z79890 Hormone replacement therapy (postmenopausal): Secondary | ICD-10-CM | POA: Diagnosis not present

## 2018-10-02 DIAGNOSIS — K5730 Diverticulosis of large intestine without perforation or abscess without bleeding: Secondary | ICD-10-CM | POA: Insufficient documentation

## 2018-10-02 DIAGNOSIS — I2510 Atherosclerotic heart disease of native coronary artery without angina pectoris: Secondary | ICD-10-CM | POA: Insufficient documentation

## 2018-10-02 DIAGNOSIS — E039 Hypothyroidism, unspecified: Secondary | ICD-10-CM | POA: Diagnosis not present

## 2018-10-02 DIAGNOSIS — I10 Essential (primary) hypertension: Secondary | ICD-10-CM | POA: Insufficient documentation

## 2018-10-02 DIAGNOSIS — I4891 Unspecified atrial fibrillation: Secondary | ICD-10-CM | POA: Diagnosis not present

## 2018-10-02 DIAGNOSIS — F419 Anxiety disorder, unspecified: Secondary | ICD-10-CM | POA: Insufficient documentation

## 2018-10-02 DIAGNOSIS — G4730 Sleep apnea, unspecified: Secondary | ICD-10-CM | POA: Insufficient documentation

## 2018-10-02 DIAGNOSIS — Z8249 Family history of ischemic heart disease and other diseases of the circulatory system: Secondary | ICD-10-CM | POA: Insufficient documentation

## 2018-10-02 DIAGNOSIS — Z79899 Other long term (current) drug therapy: Secondary | ICD-10-CM | POA: Insufficient documentation

## 2018-10-02 DIAGNOSIS — I252 Old myocardial infarction: Secondary | ICD-10-CM | POA: Diagnosis not present

## 2018-10-02 DIAGNOSIS — Z87892 Personal history of anaphylaxis: Secondary | ICD-10-CM | POA: Insufficient documentation

## 2018-10-02 DIAGNOSIS — Z888 Allergy status to other drugs, medicaments and biological substances status: Secondary | ICD-10-CM | POA: Insufficient documentation

## 2018-10-02 DIAGNOSIS — Z86010 Personal history of colonic polyps: Secondary | ICD-10-CM | POA: Diagnosis not present

## 2018-10-02 DIAGNOSIS — Z1211 Encounter for screening for malignant neoplasm of colon: Secondary | ICD-10-CM | POA: Diagnosis not present

## 2018-10-02 DIAGNOSIS — Z7984 Long term (current) use of oral hypoglycemic drugs: Secondary | ICD-10-CM | POA: Insufficient documentation

## 2018-10-02 DIAGNOSIS — E785 Hyperlipidemia, unspecified: Secondary | ICD-10-CM | POA: Diagnosis not present

## 2018-10-02 DIAGNOSIS — E119 Type 2 diabetes mellitus without complications: Secondary | ICD-10-CM | POA: Insufficient documentation

## 2018-10-02 DIAGNOSIS — D649 Anemia, unspecified: Secondary | ICD-10-CM

## 2018-10-02 DIAGNOSIS — K635 Polyp of colon: Secondary | ICD-10-CM | POA: Diagnosis not present

## 2018-10-02 DIAGNOSIS — K449 Diaphragmatic hernia without obstruction or gangrene: Secondary | ICD-10-CM | POA: Diagnosis not present

## 2018-10-02 DIAGNOSIS — Z955 Presence of coronary angioplasty implant and graft: Secondary | ICD-10-CM | POA: Insufficient documentation

## 2018-10-02 DIAGNOSIS — Z7901 Long term (current) use of anticoagulants: Secondary | ICD-10-CM | POA: Insufficient documentation

## 2018-10-02 DIAGNOSIS — K219 Gastro-esophageal reflux disease without esophagitis: Secondary | ICD-10-CM | POA: Insufficient documentation

## 2018-10-02 DIAGNOSIS — F329 Major depressive disorder, single episode, unspecified: Secondary | ICD-10-CM | POA: Diagnosis not present

## 2018-10-02 DIAGNOSIS — M8580 Other specified disorders of bone density and structure, unspecified site: Secondary | ICD-10-CM | POA: Insufficient documentation

## 2018-10-02 HISTORY — PX: POLYPECTOMY: SHX5525

## 2018-10-02 HISTORY — PX: COLONOSCOPY WITH PROPOFOL: SHX5780

## 2018-10-02 LAB — GLUCOSE, CAPILLARY
Glucose-Capillary: 136 mg/dL — ABNORMAL HIGH (ref 70–99)
Glucose-Capillary: 114 mg/dL — ABNORMAL HIGH (ref 70–99)

## 2018-10-02 SURGERY — COLONOSCOPY WITH PROPOFOL
Anesthesia: Monitor Anesthesia Care

## 2018-10-02 MED ORDER — PROPOFOL 10 MG/ML IV BOLUS
INTRAVENOUS | Status: AC
  Filled 2018-10-02: qty 40

## 2018-10-02 MED ORDER — PROPOFOL 10 MG/ML IV BOLUS
INTRAVENOUS | Status: AC
  Filled 2018-10-02: qty 20

## 2018-10-02 MED ORDER — CHLORHEXIDINE GLUCONATE CLOTH 2 % EX PADS: 6.0000 | MEDICATED_PAD | Freq: Once | CUTANEOUS | Status: DC

## 2018-10-02 MED ORDER — KETAMINE HCL 10 MG/ML IJ SOLN
INTRAMUSCULAR | Status: DC | PRN
  Administered 2018-10-02: 10 mg via INTRAVENOUS

## 2018-10-02 MED ORDER — PROMETHAZINE HCL 25 MG/ML IJ SOLN: 6.2500 mg | INTRAMUSCULAR | Status: DC | PRN

## 2018-10-02 MED ORDER — GLYCOPYRROLATE 0.2 MG/ML IJ SOLN
INTRAMUSCULAR | Status: DC | PRN
  Administered 2018-10-02: 0.2 mg via INTRAVENOUS

## 2018-10-02 MED ORDER — MEPERIDINE HCL 50 MG/ML IJ SOLN: 6.2500 mg | INTRAMUSCULAR | Status: DC | PRN

## 2018-10-02 MED ORDER — LIDOCAINE HCL (CARDIAC) PF 100 MG/5ML IV SOSY
PREFILLED_SYRINGE | INTRAVENOUS | Status: DC | PRN
  Administered 2018-10-02: 30 mg via INTRAVENOUS

## 2018-10-02 MED ORDER — HYDROCODONE-ACETAMINOPHEN 7.5-325 MG PO TABS: 1.0000 | ORAL_TABLET | Freq: Once | ORAL | Status: DC | PRN

## 2018-10-02 MED ORDER — LACTATED RINGERS IV SOLN
INTRAVENOUS | Status: DC
  Administered 2018-10-02: 1000 mL via INTRAVENOUS

## 2018-10-02 MED ORDER — LACTATED RINGERS IV SOLN: INTRAVENOUS | Status: DC

## 2018-10-02 MED ORDER — PROPOFOL 10 MG/ML IV BOLUS
INTRAVENOUS | Status: DC | PRN
  Administered 2018-10-02: 20 mg via INTRAVENOUS

## 2018-10-02 MED ORDER — HYDROMORPHONE HCL 1 MG/ML IJ SOLN: 0.2500 mg | INTRAMUSCULAR | Status: DC | PRN

## 2018-10-02 MED ORDER — KETAMINE HCL 50 MG/5ML IJ SOSY
PREFILLED_SYRINGE | INTRAMUSCULAR | Status: AC
  Filled 2018-10-02: qty 5

## 2018-10-02 MED ORDER — PROPOFOL 500 MG/50ML IV EMUL
INTRAVENOUS | Status: DC | PRN
  Administered 2018-10-02: 150 ug/kg/min via INTRAVENOUS

## 2018-10-02 NOTE — Anesthesia Preprocedure Evaluation (Addendum)
Anesthesia Evaluation    Airway Mallampati: II       Dental  (+) Upper Dentures, Edentulous Lower   Pulmonary shortness of breath, sleep apnea ,    breath sounds clear to auscultation       Cardiovascular hypertension, + angina + CAD and + Past MI  + dysrhythmias Atrial Fibrillation  Rhythm:irregular     Neuro/Psych Anxiety Depression    GI/Hepatic hiatal hernia, GERD  ,  Endo/Other  diabetesHypothyroidism   Renal/GU      Musculoskeletal   Abdominal   Peds  Hematology  (+) Blood dyscrasia, anemia ,   Anesthesia Other Findings Coumadin for Afib Denies subsequent angina after Non-STEMI rare NTG usage deconditioned  Reproductive/Obstetrics                             Anesthesia Physical Anesthesia Plan  ASA: IV  Anesthesia Plan: MAC   Post-op Pain Management:    Induction:   PONV Risk Score and Plan:   Airway Management Planned:   Additional Equipment:   Intra-op Plan:   Post-operative Plan:   Informed Consent: I have reviewed the patients History and Physical, chart, labs and discussed the procedure including the risks, benefits and alternatives for the proposed anesthesia with the patient or authorized representative who has indicated his/her understanding and acceptance.       Plan Discussed with: Anesthesiologist  Anesthesia Plan Comments:        Anesthesia Quick Evaluation

## 2018-10-02 NOTE — Discharge Instructions (Signed)
Colonoscopy Discharge Instructions  Read the instructions outlined below and refer to this sheet in the next few weeks. These discharge instructions provide you with general information on caring for yourself after you leave the hospital. Your doctor may also give you specific instructions. While your treatment has been planned according to the most current medical practices available, unavoidable complications occasionally occur. If you have any problems or questions after discharge, call Dr. Gala Romney at [phone]. ACTIVITY  You may resume your regular activity, but move at a slower pace for the next 24 hours.   Take frequent rest periods for the next 24 hours.   Walking will help get rid of the air and reduce the bloated feeling in your belly (abdomen).   No driving for 24 hours (because of the medicine (anesthesia) used during the test).    Do not sign any important legal documents or operate any machinery for 24 hours (because of the anesthesia used during the test).  NUTRITION  Drink plenty of fluids.   You may resume your normal diet as instructed by your doctor.   Begin with a light meal and progress to your normal diet. Heavy or fried foods are harder to digest and may make you feel sick to your stomach (nauseated).   Avoid alcoholic beverages for 24 hours or as instructed.  MEDICATIONS  You may resume your normal medications unless your doctor tells you otherwise.  WHAT YOU CAN EXPECT TODAY  Some feelings of bloating in the abdomen.   Passage of more gas than usual.   Spotting of blood in your stool or on the toilet paper.  IF YOU HAD POLYPS REMOVED DURING THE COLONOSCOPY:  No aspirin products for 7 days or as instructed.   No alcohol for 7 days or as instructed.   Eat a soft diet for the next 24 hours.  FINDING OUT THE RESULTS OF YOUR TEST Not all test results are available during your visit. If your test results are not back during the visit, make an appointment  with your caregiver to find out the results. Do not assume everything is normal if you have not heard from your caregiver or the medical facility. It is important for you to follow up on all of your test results.  SEEK IMMEDIATE MEDICAL ATTENTION IF:  You have more than a spotting of blood in your stool.   Your belly is swollen (abdominal distention).   You are nauseated or vomiting.   You have a temperature over 101.   You have abdominal pain or discomfort that is severe or gets worse throughout the day.    Colon polyp and diverticulosis information provided  Further recommendations to follow pending review of pathology report  Resume Coumadin today  At patient's request, I have discussed with her husband, Kathreen Devoid.   Diverticulosis  Diverticulosis is a condition that develops when small pouches (diverticula) form in the wall of the large intestine (colon). The colon is where water is absorbed and stool is formed. The pouches form when the inside layer of the colon pushes through weak spots in the outer layers of the colon. You may have a few pouches or many of them. What are the causes? The cause of this condition is not known. What increases the risk? The following factors may make you more likely to develop this condition:  Being older than age 48. Your risk for this condition increases with age. Diverticulosis is rare among people younger than age 98. By age 50,  many people have it.  Eating a low-fiber diet.  Having frequent constipation.  Being overweight.  Not getting enough exercise.  Smoking.  Taking over-the-counter pain medicines, like aspirin and ibuprofen.  Having a family history of diverticulosis. What are the signs or symptoms? In most people, there are no symptoms of this condition. If you do have symptoms, they may include:  Bloating.  Cramps in the abdomen.  Constipation or diarrhea.  Pain in the lower left side of the abdomen. How is this  diagnosed? This condition is most often diagnosed during an exam for other colon problems. Because diverticulosis usually has no symptoms, it often cannot be diagnosed independently. This condition may be diagnosed by:  Using a flexible scope to examine the colon (colonoscopy).  Taking an X-ray of the colon after dye has been put into the colon (barium enema).  Doing a CT scan. How is this treated? You may not need treatment for this condition if you have never developed an infection related to diverticulosis. If you have had an infection before, treatment may include:  Eating a high-fiber diet. This may include eating more fruits, vegetables, and grains.  Taking a fiber supplement.  Taking a live bacteria supplement (probiotic).  Taking medicine to relax your colon.  Taking antibiotic medicines. Follow these instructions at home:  Drink 6-8 glasses of water or more each day to prevent constipation.  Try not to strain when you have a bowel movement.  If you have had an infection before: ? Eat more fiber as directed by your health care provider or your diet and nutrition specialist (dietitian). ? Take a fiber supplement or probiotic, if your health care provider approves.  Take over-the-counter and prescription medicines only as told by your health care provider.  If you were prescribed an antibiotic, take it as told by your health care provider. Do not stop taking the antibiotic even if you start to feel better.  Keep all follow-up visits as told by your health care provider. This is important. Contact a health care provider if:  You have pain in your abdomen.  You have bloating.  You have cramps.  You have not had a bowel movement in 3 days. Get help right away if:  Your pain gets worse.  Your bloating becomes very bad.  You have a fever or chills, and your symptoms suddenly get worse.  You vomit.  You have bowel movements that are bloody or black.  You have  bleeding from your rectum. Summary  Diverticulosis is a condition that develops when small pouches (diverticula) form in the wall of the large intestine (colon).  You may have a few pouches or many of them.  This condition is most often diagnosed during an exam for other colon problems.  If you have had an infection related to diverticulosis, treatment may include increasing the fiber in your diet, taking supplements, or taking medicines. This information is not intended to replace advice given to you by your health care provider. Make sure you discuss any questions you have with your health care provider. Document Released: 12/22/2003 Document Revised: 02/13/2016 Document Reviewed: 02/13/2016 Elsevier Interactive Patient Education  2019 [employer].  Colon Polyps  Polyps are tissue growths inside the body. Polyps can grow in many places, including the large intestine (colon). A polyp may be a round bump or a mushroom-shaped growth. You could have one polyp or several. Most colon polyps are noncancerous (benign). However, some colon polyps can become cancerous over time.  Finding and removing the polyps early can help prevent this. What are the causes? The exact cause of colon polyps is not known. What increases the risk? You are more likely to develop this condition if you:  Have a family history of colon cancer or colon polyps.  Are older than 4 or older than 45 if you are African American.  Have inflammatory bowel disease, such as ulcerative colitis or Crohn's disease.  Have certain hereditary conditions, such as: ? Familial adenomatous polyposis. ? Lynch syndrome. ? Turcot syndrome. ? Peutz-Jeghers syndrome.  Are overweight.  Smoke cigarettes.  Do not get enough exercise.  Drink too much alcohol.  Eat a diet that is high in fat and red meat and low in fiber.  Had childhood cancer that was treated with abdominal radiation. What are the signs or symptoms? Most  polyps do not cause symptoms. If you have symptoms, they may include:  Blood coming from your rectum when having a bowel movement.  Blood in your stool. The stool may look dark red or black.  Abdominal pain.  A change in bowel habits, such as constipation or diarrhea. How is this diagnosed? This condition is diagnosed with a colonoscopy. This is a procedure in which a lighted, flexible scope is inserted into the anus and then passed into the colon to examine the area. Polyps are sometimes found when a colonoscopy is done as part of routine cancer screening tests. How is this treated? Treatment for this condition involves removing any polyps that are found. Most polyps can be removed during a colonoscopy. Those polyps will then be tested for cancer. Additional treatment may be needed depending on the results of testing. Follow these instructions at home: Lifestyle  Maintain a healthy weight, or lose weight if recommended by your health care provider.  Exercise every day or as told by your health care provider.  Do not use any products that contain nicotine or tobacco, such as cigarettes and e-cigarettes. If you need help quitting, ask your health care provider.  If you drink alcohol, limit how much you have: ? 0-1 drink a day for women. ? 0-2 drinks a day for men.  Be aware of how much alcohol is in your drink. In the [place] one drink equals one 12 oz bottle of beer (355 mL), one 5 oz glass of wine (148 mL), or one 1 oz shot of hard liquor (44 mL). Eating and drinking   Eat foods that are high in fiber, such as fruits, vegetables, and whole grains.  Eat foods that are high in calcium and vitamin D, such as milk, cheese, yogurt, eggs, liver, fish, and broccoli.  Limit foods that are high in fat, such as fried foods and desserts.  Limit the amount of red meat and processed meat you eat, such as hot dogs, sausage, bacon, and lunch meats. General instructions  Keep all follow-up  visits as told by your health care provider. This is important. ? This includes having regularly scheduled colonoscopies. ? Talk to your health care provider about when you need a colonoscopy. Contact a health care provider if:  You have new or worsening bleeding during a bowel movement.  You have new or increased blood in your stool.  You have a change in bowel habits.  You lose weight for no known reason. Summary  Polyps are tissue growths inside the body. Polyps can grow in many places, including the colon.  Most colon polyps are noncancerous (benign), but some can become  cancerous over time.  This condition is diagnosed with a colonoscopy.  Treatment for this condition involves removing any polyps that are found. Most polyps can be removed during a colonoscopy. This information is not intended to replace advice given to you by your health care provider. Make sure you discuss any questions you have with your health care provider. Document Released: 12/21/2003 Document Revised: 07/11/2017 Document Reviewed: 07/11/2017 Elsevier Interactive Patient Education  2019 [employer].

## 2018-10-02 NOTE — Transfer of Care (Signed)
Immediate Anesthesia Transfer of Care Note  Patient: Rhonda Garcia  Procedure(s) Performed: COLONOSCOPY WITH PROPOFOL (N/A ) POLYPECTOMY  Patient Location: PACU  Anesthesia Type:MAC  Level of Consciousness: awake, alert , oriented and patient cooperative  Airway & Oxygen Therapy: Patient Spontanous Breathing  Post-op Assessment: Report given to RN and Post -op Vital signs reviewed and stable  Post vital signs: Reviewed and stable  Last Vitals:  Vitals Value Taken Time  BP    Temp    Pulse 71 10/02/18 0957  Resp 20 10/02/18 0958  SpO2 89 % 10/02/18 0957  Vitals shown include unvalidated device data.  Last Pain:  Vitals:   10/02/18 0936  TempSrc:   PainSc: 0-No pain      Patients Stated Pain Goal: 5 (60/15/61 5379)  Complications: No apparent anesthesia complications

## 2018-10-02 NOTE — Op Note (Signed)
[facility] Patient Name: Rhonda Garcia Procedure Date: 10/02/2018 9:33 AM MRN: [id number] Date of Birth: Feb 23, 1941 Attending MD: Norvel Richards , MD CSN: [id number] Age: 78 Admit Type: Outpatient Procedure:                Colonoscopy Indications:              High risk colon cancer surveillance: Personal                            history of colonic polyps Providers:                Norvel Richards, MD, Otis Peak B. Sharon Seller, RN,                            Nelma Rothman, Technician Referring MD:              Medicines:                Propofol per Anesthesia Complications:            No immediate complications. Estimated Blood Loss:     Estimated blood loss was minimal. Procedure:                Pre-Anesthesia Assessment:                           - Prior to the procedure, a History and Physical                            was performed, and patient medications and                            allergies were reviewed. The patient's tolerance of                            previous anesthesia was also reviewed. The risks                            and benefits of the procedure and the sedation                            options and risks were discussed with the patient.                            All questions were answered, and informed consent                            was obtained. Prior Anticoagulants: The patient                            last took Coumadin (warfarin) 3 days prior to the                            procedure. ASA Grade Assessment: II - A patient  with mild systemic disease. After reviewing the                            risks and benefits, the patient was deemed in                            satisfactory condition to undergo the procedure.                           After obtaining informed consent, the colonoscope                            was passed under direct vision. Throughout the                            procedure, the  patient's blood pressure, pulse, and                            oxygen saturations were monitored continuously. The                            CF-[id number] ([id number]) scope was introduced through                            the anus and advanced to the the cecum, identified                            by appendiceal orifice and ileocecal valve. The                            colonoscopy was performed without difficulty. The                            patient tolerated the procedure well. The quality                            of the bowel preparation was adequate. Scope In: 9:39:54 AM Scope Out: 9:52:15 AM Scope Withdrawal Time: 0 hours 8 minutes 58 seconds  Total Procedure Duration: 0 hours 12 minutes 21 seconds  Findings:      The perianal and digital rectal examinations were normal.      Scattered medium-mouthed diverticula were found in the sigmoid colon and       descending colon.      Two sessile polyps were found in the ascending colon. The polyps were 4       to 5 mm in size. These polyps were removed with a cold snare. Resection       and retrieval were complete. Estimated blood loss was minimal.      The exam was otherwise without abnormality on direct and retroflexion       views. Impression:               - Diverticulosis in the sigmoid colon and in the  descending colon.                           - Two 4 to 5 mm polyps in the ascending colon,                            removed with a cold snare. Resected and retrieved.                           - The examination was otherwise normal on direct                            and retroflexion views. Moderate Sedation:      Moderate (conscious) sedation was personally administered by an       anesthesia professional. The following parameters were monitored: oxygen       saturation, heart rate, blood pressure, respiratory rate, EKG, adequacy       of pulmonary ventilation, and response to care. Recommendation:            - Patient has a contact number available for                            emergencies. The signs and symptoms of potential                            delayed complications were discussed with the                            patient. Return to normal activities tomorrow.                            Written discharge instructions were provided to the                            patient.                           - Resume previous diet.                           - Continue present medications. Resume Coumadin                            today                           - Repeat colonoscopy date to be determined after                            pending pathology results are reviewed for                            surveillance based on pathology results. Procedure Code(s):        --- Professional ---  45385, Colonoscopy, flexible; with removal of                            tumor(s), polyp(s), or other lesion(s) by snare                            technique Diagnosis Code(s):        --- Professional ---                           Z86.010, Personal history of colonic polyps                           K63.5, Polyp of colon                           K57.30, Diverticulosis of large intestine without                            perforation or abscess without bleeding CPT copyright 2019 [employer]. All rights reserved. The codes documented in this report are preliminary and upon coder review may  be revised to meet current compliance requirements. Cristopher Estimable. Patsie Mccardle, MD Norvel Richards, MD 10/02/2018 10:03:20 AM This report has been signed electronically. Number of Addenda: 0

## 2018-10-02 NOTE — Anesthesia Postprocedure Evaluation (Signed)
Anesthesia Post Note  Patient: Rhonda Garcia  Procedure(s) Performed: COLONOSCOPY WITH PROPOFOL (N/A ) POLYPECTOMY  Patient location during evaluation: PACU Level of consciousness: awake and alert and oriented Pain management: pain level controlled Vital Signs Assessment: post-procedure vital signs reviewed and stable Respiratory status: spontaneous breathing Cardiovascular status: stable Anesthetic complications: no     Last Vitals:  Vitals:   10/02/18 0821  BP: (!) 135/47  Pulse: (!) 57  Resp: 17  Temp: 36.4 C  SpO2: 98%    Last Pain:  Vitals:   10/02/18 0936  TempSrc:   PainSc: 0-No pain                 Jourdon Zimmerle A

## 2018-10-02 NOTE — Addendum Note (Signed)
Addendum  created 10/02/18 1353 by Mickel Baas, CRNA   Clinical Note Signed, Review and Sign - Signed

## 2018-10-02 NOTE — H&P (Signed)
@LOGO @   Primary Care Physician:  Sharion Balloon, FNP Primary Gastroenterologist:  Dr. Gala Romney  Pre-Procedure History & Physical: HPI:  Rhonda Garcia is a 78 y.o. female here for surveillance colonoscopy.  History of colonic polyps.  Coumadin held 3 days ago per plan.  Past Medical History:  Diagnosis Date  . A-fib ([place])   . Anal fissure    resolved   . Anemia   . Back pain    with left radiculopathy  . CAD (coronary artery disease)   . Cataract   . Collagen vascular disease ([place])   . DDD (degenerative disc disease)   . Depression   . Diabetes mellitus   . Diverticulosis   . Dyslipidemia   . Dysrhythmia    AFib  . Esophagitis   . GERD (gastroesophageal reflux disease)   . Hiatal hernia   . Hx of peptic ulcer   . Hyperlipidemia   . Hypertension   . Hypothyroidism   . Internal hemorrhoids 2017  . NSTEMI (non-ST elevated myocardial infarction) ([place]) 08/05/2015  . Osteopenia   . Sleep apnea    not using CPAP now, could not tolerate and PCP aware.  . Thyroid disease     Past Surgical History:  Procedure Laterality Date  . ABDOMINAL HYSTERECTOMY Bilateral 2001 - approximate  . APPENDECTOMY    . BREAST REDUCTION SURGERY    . CARDIAC CATHETERIZATION N/A 08/08/2015   Procedure: Left Heart Cath and Coronary Angiography;  Surgeon: Burnell Blanks, MD;  Location: [facility] [facility];  Service: Cardiovascular;  Laterality: N/A;  . CHOLECYSTECTOMY  2008 - approximate  . COLONOSCOPY  06/2007   Friable anal canal and anal papillae. Pancolonic diverticula. Normal terminal ileum. Status post segmental biopsy, descending colon biopsy showed minimal cryptitis. Biopsies felt to be  nonspecific but could be seen with NSAIDs. No features of inflammatory bowel disease. Stool studies were negative.  . COLONOSCOPY  03/21/2011   RMR: colonic polyps treated as described above, colonic diverticulosis (Tubular adenomas)  . COLONOSCOPY WITH PROPOFOL N/A 06/17/2014   RMR: Colonic  diverticulosis. Multiple colonic polyps removed as described above.   . COLONOSCOPY WITH PROPOFOL N/A 03/19/2016   Procedure: COLONOSCOPY WITH PROPOFOL;  Surgeon: Daneil Dolin, MD;  [facility];  Service: Endoscopy;  Laterality: N/A;  8:45am  . CORONARY ANGIOPLASTY WITH STENT PLACEMENT     4 stents  . ESOPHAGEAL DILATION N/A 06/17/2014   Procedure: ESOPHAGEAL DILATION;  Surgeon: Daneil Dolin, MD;  Location: [facility];  Service: Endoscopy;  Laterality: N/A;  Maloney 32 Fr  . ESOPHAGOGASTRODUODENOSCOPY  06/2007   Small hiatal hernia  . ESOPHAGOGASTRODUODENOSCOPY  03/21/2011   RMR: normal esophagus- status post passage of Maloney dilator. Small hiatal hernia. Trivial antral and bulbar erosions  . ESOPHAGOGASTRODUODENOSCOPY (EGD) WITH PROPOFOL N/A 06/17/2014   RMR: Small hiatal hernia; otherwise normal EGD. Status post passage of a Maloney dilator. No explanation for patients right upper quadrant abdominal pain.   Marland Kitchen ESOPHAGOGASTRODUODENOSCOPY (EGD) WITH PROPOFOL N/A 03/19/2016   Procedure: ESOPHAGOGASTRODUODENOSCOPY (EGD) WITH PROPOFOL;  Surgeon: Daneil Dolin, MD;  [facility];  Service: Endoscopy;  Laterality: N/A;  . KNEE SURGERY Left    arthroscopy  . LIPOMA EXCISION  03/17/2012   Procedure: EXCISION LIPOMA;  Surgeon: Gwenyth Ober, MD;  Location: [facility];  Service: General;  Laterality: Right;  excision of right lower back lipoma  . MALONEY DILATION N/A 03/19/2016   Procedure: MALONEY DILATION;  Surgeon:  Daneil Dolin, MD;  [facility];  Service: Endoscopy;  Laterality: N/A;  . POLYPECTOMY  06/17/2014   Procedure: POLYPECTOMY;  Surgeon: Daneil Dolin, MD;  Location: [facility];  Service: Endoscopy;;    Prior to Admission medications   Medication Sig Start Date End Date Taking? Authorizing Provider  acetaminophen (TYLENOL) 325 MG tablet Take 325 mg by mouth every 6 (six) hours as needed for moderate pain.   Yes [provider]   ALPRAZolam (XANAX) 0.5 MG tablet Take 1 tablet (0.5 mg total) by mouth at bedtime as needed. 03/20/18  Yes Hawks, Christy A, FNP  amLODipine (NORVASC) 5 MG tablet Take 1 tablet (5 mg total) by mouth daily. 05/16/18  Yes Hawks, Christy A, FNP  escitalopram (LEXAPRO) 10 MG tablet Take 1 tablet (10 mg total) by mouth daily. 01/10/17  Yes Hawks, Christy A, FNP  fluticasone (FLONASE) 50 MCG/ACT nasal spray Place 2 sprays into both nostrils daily. 06/12/18  Yes Rakes, Connye Burkitt, FNP  HYDROcodone-acetaminophen (NORCO) 5-325 MG tablet Take 1 tablet by mouth every 4 (four) hours as needed for moderate pain or severe pain. 09/25/18  Yes Terald Sleeper, PA-C  isosorbide mononitrate (IMDUR) 60 MG 24 hr tablet TAKE ONE (1) TABLET EACH DAY Patient taking differently: Take 60 mg by mouth daily. ** DO NOT CRUSH ** 04/07/18  Yes Hochrein, Jeneen Rinks, MD  levothyroxine (SYNTHROID) 50 MCG tablet TAKE ONE (1) TABLET EACH DAY Patient taking differently: Take 50 mcg by mouth daily before breakfast.  08/08/18  Yes Hawks, Theador Hawthorne, FNP  linaclotide (LINZESS) 145 MCG CAPS capsule Take 1 capsule (145 mcg total) by mouth daily before breakfast. 07/24/18  Yes Carlis Stable, NP  lisinopril-hydrochlorothiazide (PRINZIDE,ZESTORETIC) 20-12.5 MG tablet Take 2 tablets by mouth daily. 04/17/18  Yes Hawks, Christy A, FNP  meclizine (ANTIVERT) 25 MG tablet Take 1 tablet (25 mg total) by mouth 3 (three) times daily as needed for dizziness. 08/13/18  Yes Hawks, Christy A, FNP  metFORMIN (GLUCOPHAGE) 1000 MG tablet TAKE ONE TABLET TWICE A DAY WITH FOOD Patient taking differently: Take 1,000 mg by mouth 2 (two) times daily with a meal.  04/07/18  Yes Hawks, Christy A, FNP  metoprolol tartrate (LOPRESSOR) 50 MG tablet TAKE ONE TABLET BY MOUTH TWICE DAILY Patient taking differently: Take 50 mg by mouth 2 (two) times daily.  04/04/18  Yes Hawks, Christy A, FNP  pantoprazole (PROTONIX) 40 MG tablet TAKE ONE TABLET BY MOUTH TWICE DAILY Patient taking  differently: Take 40 mg by mouth 2 (two) times daily.  11/14/17  Yes Hawks, Christy A, FNP  polyethylene glycol-electrolytes (TRILYTE) 420 g solution Take 4,000 mLs by mouth as directed. 07/22/18  Yes Ayren Zumbro, Cristopher Estimable, MD  pravastatin (PRAVACHOL) 80 MG tablet Take 1 tablet (80 mg total) by mouth every evening. NEED OV. 04/17/18  Yes Hawks, Christy A, FNP  sitaGLIPtin (JANUVIA) 100 MG tablet Take 1 tablet (100 mg total) by mouth daily. (Please make 3 mos June appt) 09/10/18  Yes Hawks, Christy A, FNP  spironolactone (ALDACTONE) 25 MG tablet TAKE ONE (1) TABLET EACH DAY Patient taking differently: Take 25 mg by mouth daily. TAKE ONE (1) TABLET EACH DAY 04/17/18  Yes Hawks, Christy A, FNP  tiZANidine (ZANAFLEX) 2 MG tablet Take 1 tablet (2 mg total) by mouth every 8 (eight) hours as needed for muscle spasms. Patient taking differently: Take 2 mg by mouth 2 (two) times daily as needed for muscle spasms.  08/26/18  Yes Particia Nearing  S, PA-C  traMADol (ULTRAM) 50 MG tablet Take 1 tablet (50 mg total) by mouth every 12 (twelve) hours as needed. 05/30/18  Yes Hawks, Christy A, FNP  Vitamin D, Ergocalciferol, (DRISDOL) 1.25 MG (50000 UT) CAPS capsule TAKE 1 CAPSULE EVERY 7 DAYS Patient taking differently: Take 50,000 Units by mouth. TAKE 1 CAPSULE EVERY 7 DAYS 08/29/18  Yes Hawks, Christy A, FNP  warfarin (COUMADIN) 5 MG tablet TAKE 1 TO 1&1/2 TABLET DAILY AS DIRECTED Patient taking differently: Take by mouth daily. Take 2.5 mg every Monday and Friday, then take 5 mg daily on Sun, Tues, Wed, Thurs, Sat, Sun. 07/21/18  Yes Hawks, Theador Hawthorne, FNP  nitroGLYCERIN (NITROSTAT) 0.4 MG SL tablet Place 1 tablet (0.4 mg total) under the tongue every 5 (five) minutes as needed for chest pain. 08/29/18   Sharion Balloon, FNP    Allergies as of 07/22/2018 - Review Complete 07/22/2018  Allergen Reaction Noted  . Promethazine hcl Anaphylaxis 09/06/2010    Family History  Problem Relation Age of Onset  . Heart attack Mother   .  Hypertension Mother   . Stroke Mother        Deceased, age 2  . Diabetes Sister   . Diabetes Sister   . Cancer Sister        ovarian  . Stroke Sister   . Diabetes Brother        also has a blood disorder   . Clotting disorder Brother   . Kidney disease Brother   . Heart disease Brother   . Melanoma Brother        deceased  . Coronary artery disease Other   . Kidney disease Other   . Diabetes Son   . Hypertension Son   . Coronary artery disease Son 44       CABG  . Hypertension Son   . Coronary artery disease Son 65       CABG  . Colon cancer Neg Hx     Social History   Socioeconomic History  . Marital status: Married    Spouse name: Not on file  . Number of children: 4  . Years of education: 6  . Highest education level: 6th grade  Occupational History  . Occupation: Retired    Comment: Charity fundraiser  Social Needs  . Financial resource strain: Somewhat hard  . Food insecurity    Worry: Never true    Inability: Never true  . Transportation needs    Medical: No    Non-medical: No  Tobacco Use  . Smoking status: Never Smoker  . Smokeless tobacco: Never Used  Substance and Sexual Activity  . Alcohol use: Not Currently    Alcohol/week: 1.0 standard drinks    Types: 1 Glasses of wine per week  . Drug use: No  . Sexual activity: Yes    Birth control/protection: Post-menopausal  Lifestyle  . Physical activity    Days per week: 0 days    Minutes per session: 0 min  . Stress: Only a little  Relationships  . Social connections    Talks on phone: More than three times a week    Gets together: More than three times a week    Attends religious service: More than 4 times per year    Active member of club or organization: Yes    Attends meetings of clubs or organizations: More than 4 times per year    Relationship status: Married  . Intimate partner violence  Fear of current or ex partner: No    Emotionally abused: No    Physically abused: No    Forced  sexual activity: No  Other Topics Concern  . Not on file  Social History Narrative   Widowed but is remarrying in July 2019. Has 4 adult children. Lives in a house with fiance.     Review of Systems: See HPI, otherwise negative ROS  Physical Exam: BP (!) 135/47   Pulse (!) 57   Temp 97.6 F (36.4 C) (Oral)   Resp 17   SpO2 98%  General:   Alert,  Well-developed, well-nourished, pleasant and cooperative in NAD Neck:  Supple; no masses or thyromegaly. No significant cervical adenopathy. Lungs:  Clear throughout to auscultation.   No wheezes, crackles, or rhonchi. No acute distress. Heart:  Regular rate and rhythm; no murmurs, clicks, rubs,  or gallops. Abdomen: Non-distended, normal bowel sounds.  Soft and nontender without appreciable mass or hepatosplenomegaly.  Pulses:  Normal pulses noted. Extremities:  Without clubbing or edema.  Impression/Plan: 78 year old lady with a history of colonic adenomas; here for surveillance colonoscopy.  Coumadin held 3 days ago per plan. \The risks, benefits, limitations, alternatives and imponderables have been reviewed with the patient. Questions have been answered. All parties are agreeable.      Notice: This dictation was prepared with Dragon dictation along with smaller phrase technology. Any transcriptional errors that result from this process are unintentional and may not be corrected upon review.

## 2018-10-02 NOTE — Anesthesia Procedure Notes (Signed)
Procedure Name: MAC Date/Time: 10/02/2018 9:34 AM Performed by: Andree Elk Jahon Bart A, CRNA Pre-anesthesia Checklist: Patient identified, Emergency Drugs available, Suction available, Timeout performed and Patient being monitored Patient Re-evaluated:Patient Re-evaluated prior to induction Oxygen Delivery Method: Non-rebreather mask

## 2018-10-03 ENCOUNTER — Encounter: Payer: Self-pay | Admitting: Internal Medicine

## 2018-10-07 ENCOUNTER — Encounter (HOSPITAL_COMMUNITY): Payer: Self-pay | Admitting: Internal Medicine

## 2018-10-16 ENCOUNTER — Ambulatory Visit (INDEPENDENT_AMBULATORY_CARE_PROVIDER_SITE_OTHER): Payer: Medicare Other | Admitting: Family Medicine

## 2018-10-16 DIAGNOSIS — K591 Functional diarrhea: Secondary | ICD-10-CM

## 2018-10-22 ENCOUNTER — Other Ambulatory Visit: Payer: Self-pay | Admitting: *Deleted

## 2018-10-22 ENCOUNTER — Telehealth: Payer: Self-pay | Admitting: Family Medicine

## 2018-10-22 DIAGNOSIS — R197 Diarrhea, unspecified: Secondary | ICD-10-CM

## 2018-10-22 DIAGNOSIS — R195 Other fecal abnormalities: Secondary | ICD-10-CM

## 2018-10-22 DIAGNOSIS — R1013 Epigastric pain: Secondary | ICD-10-CM

## 2018-10-22 DIAGNOSIS — R630 Anorexia: Secondary | ICD-10-CM

## 2018-10-22 NOTE — Telephone Encounter (Signed)
Referral placed. Patient aware.  

## 2018-10-22 NOTE — Telephone Encounter (Signed)
Please refer as pt requests 

## 2018-10-24 ENCOUNTER — Encounter: Payer: Self-pay | Admitting: Family Medicine

## 2018-10-24 MED ORDER — DIPHENOXYLATE-ATROPINE 2.5-0.025 MG PO TABS: 2.0000 | ORAL_TABLET | Freq: Four times a day (QID) | ORAL | 0 refills | Status: DC | PRN

## 2018-10-24 MED ORDER — ONDANSETRON 8 MG PO TBDP: 8.0000 mg | ORAL_TABLET | Freq: Four times a day (QID) | ORAL | 1 refills | Status: DC | PRN

## 2018-10-24 NOTE — Progress Notes (Signed)
Subjective:    Patient ID: Rhonda Garcia, female    DOB: [birth year], 78 y.o.   MRN: [id number]   HPI: Rhonda Garcia is a 78 y.o. female presenting for one  Week of increasing amounts of diarrhea. She has a loose bowel movement every time she eats. Denies abdominal pain. No hematochezia or rectal pain.  She states appetite has been poor for 2 months. She has had no fever or cough or other symptoms referrable to COVID 19.    Depression screen [facility] 2/9 09/25/2018 08/29/2018 06/19/2018 06/12/2018 05/30/2018  Decreased Interest 0 0 0 0 0  Down, Depressed, Hopeless 0 1 0 0 0  PHQ - 2 Score 0 1 0 0 0  Altered sleeping - - - - -  Tired, decreased energy - - - - -  Change in appetite - - - - -  Feeling bad or failure about yourself  - - - - -  Trouble concentrating - - - - -  Moving slowly or fidgety/restless - - - - -  Suicidal thoughts - - - - -  PHQ-9 Score - - - - -  Difficult doing work/chores - - - - -  Some recent data might be hidden     Relevant past medical, surgical, family and social history reviewed and updated as indicated.  Interim medical history since our last visit reviewed. Allergies and medications reviewed and updated.  ROS:  Review of Systems  Constitutional: Positive for appetite change.  HENT: Negative for congestion.   Eyes: Negative for visual disturbance.  Respiratory: Negative for shortness of breath.   Cardiovascular: Negative for chest pain.  Gastrointestinal: Positive for abdominal distention and diarrhea. Negative for abdominal pain, anal bleeding, blood in stool, constipation, nausea, rectal pain and vomiting.  Genitourinary: Negative for difficulty urinating.  Musculoskeletal: Negative for arthralgias and myalgias.  Neurological: Negative for headaches.  Psychiatric/Behavioral: Negative for sleep disturbance.     Social History   Tobacco Use  Smoking Status Never Smoker  Smokeless Tobacco Never Used       Objective:     Wt Readings from  Last 3 Encounters:  09/26/18 188 lb (85.3 kg)  09/25/18 188 lb 6.4 oz (85.5 kg)  09/04/18 185 lb (83.9 kg)     Exam deferred. Pt. Harboring due to COVID 19. Phone visit performed.   Assessment & Plan:   1. Functional diarrhea     Meds ordered this encounter  Medications  . diphenoxylate-atropine (LOMOTIL) 2.5-0.025 MG tablet    Sig: Take 2 tablets by mouth 4 (four) times daily as needed for diarrhea or loose stools.    Dispense:  30 tablet    Refill:  0  . ondansetron (ZOFRAN-ODT) 8 MG disintegrating tablet    Sig: Take 1 tablet (8 mg total) by mouth every 6 (six) hours as needed for nausea or vomiting.    Dispense:  20 tablet    Refill:  1        Diagnoses and all orders for this visit:  Functional diarrhea  Other orders -     diphenoxylate-atropine (LOMOTIL) 2.5-0.025 MG tablet; Take 2 tablets by mouth 4 (four) times daily as needed for diarrhea or loose stools. -     ondansetron (ZOFRAN-ODT) 8 MG disintegrating tablet; Take 1 tablet (8 mg total) by mouth every 6 (six) hours as needed for nausea or vomiting.    Virtual Visit via telephone Note  I discussed the limitations, risks, security and privacy concerns  of performing an evaluation and management service by telephone and the availability of in person appointments. The patient was identified with two identifiers. Pt.expressed understanding and agreed to proceed. Pt. Is at home. Dr. Livia Snellen is in his office.  Follow Up Instructions:   I discussed the assessment and treatment plan with the patient. Bland diet. DC linzess.The patient was provided an opportunity to ask questions and all were answered. The patient agreed with the plan and demonstrated an understanding of the instructions.   The patient was advised to call back or seek an in-person evaluation if the symptoms worsen or if the condition fails to improve as anticipated.   Total minutes including chart review and phone contact time: 15   Follow up plan:  Return if symptoms worsen or fail to improve.  Rhonda Fraise, MD [place]

## 2018-11-05 ENCOUNTER — Other Ambulatory Visit: Payer: Self-pay

## 2018-11-06 ENCOUNTER — Ambulatory Visit (INDEPENDENT_AMBULATORY_CARE_PROVIDER_SITE_OTHER): Payer: Medicare Other | Admitting: Family

## 2018-11-06 ENCOUNTER — Encounter: Payer: Self-pay | Admitting: Family

## 2018-11-06 VITALS — BP 134/64 | HR 79 | Temp 97.9°F | Ht 62.0 in | Wt 186.0 lb

## 2018-11-06 DIAGNOSIS — R791 Abnormal coagulation profile: Secondary | ICD-10-CM | POA: Diagnosis not present

## 2018-11-06 DIAGNOSIS — M1990 Unspecified osteoarthritis, unspecified site: Secondary | ICD-10-CM | POA: Diagnosis not present

## 2018-11-06 DIAGNOSIS — Z7901 Long term (current) use of anticoagulants: Secondary | ICD-10-CM | POA: Diagnosis not present

## 2018-11-06 DIAGNOSIS — I480 Paroxysmal atrial fibrillation: Secondary | ICD-10-CM | POA: Diagnosis not present

## 2018-11-06 LAB — COAGUCHEK XS/INR WAIVED
INR: 6.4 (ref 0.9–1.1)
Prothrombin Time: 76.8 s

## 2018-11-06 LAB — HEMOGLOBIN, FINGERSTICK: Hemoglobin: 11.9 g/dL (ref 11.1–15.9)

## 2018-11-06 MED ORDER — PHYTONADIONE 5 MG PO TABS
5.0000 mg | ORAL_TABLET | Freq: Once | ORAL | Status: AC
  Administered 2018-11-06: 5 mg via ORAL

## 2018-11-06 NOTE — Progress Notes (Signed)
   Subjective:    Patient ID: Rhonda Garcia, female    DOB: [birth year], 78 y.o.   MRN: [id number]  Chief Complaint  Patient presents with  . protime recheck  . Joint Pain   PT presents to the office today for INR check. See anticoagulation flow sheet. She states she has been taking 5 mg every day. She has been very anxious with her caring for her husband and has had diarrhea and nausea.  Arthritis Presents for follow-up visit. She complains of pain and stiffness. The symptoms have been worsening. Affected locations include the right knee, left knee, left shoulder and right shoulder. Her pain is at a severity of 8/10.      Review of Systems  Musculoskeletal: Positive for arthralgias, arthritis and stiffness.  All other systems reviewed and are negative.      Objective:   Physical Exam Vitals signs reviewed.  Constitutional:      General: She is not in acute distress.    Appearance: She is well-developed.  HENT:     Head: Normocephalic and atraumatic.     Right Ear: External ear normal.  Eyes:     Pupils: Pupils are equal, round, and reactive to light.  Neck:     Musculoskeletal: Normal range of motion and neck supple.     Thyroid: No thyromegaly.  Cardiovascular:     Rate and Rhythm: Normal rate and regular rhythm.     Heart sounds: Normal heart sounds. No murmur.  Pulmonary:     Effort: Pulmonary effort is normal. No respiratory distress.     Breath sounds: Normal breath sounds. No wheezing.  Abdominal:     General: Bowel sounds are normal. There is no distension.     Palpations: Abdomen is soft.     Tenderness: There is no abdominal tenderness.  Musculoskeletal: Normal range of motion.        General: No tenderness.  Skin:    General: Skin is warm and dry.  Neurological:     Mental Status: She is alert and oriented to person, place, and time.     Cranial Nerves: No cranial nerve deficit.     Deep Tendon Reflexes: Reflexes are normal and symmetric.  Psychiatric:         Behavior: Behavior normal.        Thought Content: Thought content normal.        Judgment: Judgment normal.      BP 134/64   Pulse 79   Temp 97.9 F (36.6 C) (Oral)   Ht 5\' 2"  (1.575 m)   Wt 186 lb (84.4 kg)   BMI 34.02 kg/m      Assessment & Plan:  Rhonda Garcia comes in today with chief complaint of protime recheck and Joint Pain   Diagnosis and orders addressed:  1. Paroxysmal atrial fibrillation (HCC) - CoaguChek XS/INR Waived  2. Long term current use of anticoagulant therapy - CoaguChek XS/INR Waived  3. Elevated INR Pt given K+ today, will hold dose Follow up tomorrow  Description   INR today 6.4  (goal is 2-3)  Too thin.   Hold today's dose (11/06/18) and tomorrow (11/07/18). Then restart 5 mg every day except Monday and Friday. Recheck in 1 week.      - phytonadione (VITAMIN K) tablet 5 mg - Hemoglobin, fingerstick  4. Osteoarthritis, unspecified osteoarthritis type, unspecified site Will hold off on steroids at this time because of INR   Rhonda Dun, FNP

## 2018-11-06 NOTE — Patient Instructions (Signed)
Bleeding Precautions When on Anticoagulant Therapy, Adult °Anticoagulant therapy, also called blood thinner therapy, is medicine that helps to prevent and treat blood clots. The medicine works by stopping blood clots from forming or growing. Blood clots that form in your blood vessels can be dangerous. They can break loose and travel to the heart, lungs, or brain. This increases the risk of a heart attack, stroke, or blocked lung artery (pulmonary embolism). °Anticoagulants also increase the risk of bleeding. Try to protect yourself from cuts and other injuries that can cause bleeding. It is important to take anticoagulants exactly as told by your health care provider. °Why do I need to be on anticoagulant therapy? °You may need this medicine if you are at risk of developing a blood clot. Conditions that increase your risk of a blood clot include: °· Being born with heart disease or a heart malformation (congenital heart disease). °· Developing heart disease. °· Having had surgery, such as valve replacement. °· Having had a serious accident or other type of severe injury (trauma). °· Having certain types of cancer. °· Having certain diseases that can increase blood clotting. °· Having a high risk of stroke or heart attack. °· Having atrial fibrillation (AF). °What are the common anticoagulant medicines? °There are several types of anticoagulant medicines. The most common types are: °· Medicines that you take by mouth (oral medicines), such as: °? Warfarin. °? Novel oral anticoagulants (NOACs), such as: °? Direct thrombin inhibitors (dabigatran). °? Factor Xa inhibitors (apixaban, edoxaban, and rivaroxaban). °· Injections, such as: °? Unfractionated heparin. °? Low molecular weight heparin. °These anticoagulants work in different ways to prevent blood clots. They also have different risks and side effects. °What do I need to remember while on anticoagulant therapy? °Taking anticoagulants °· Take your medicine at the  same time every day. If you forget to take your medicine, take it as soon as you remember. Do not double your dosage of medicine if you miss a whole day. Take your normal dose and call your health care provider. °· Do not stop taking your medicine unless your health care provider approves. Stopping the medicine can increase your risk of developing a blood clot. °Taking other medicines °· Take over-the-counter and prescriptions medicines only as told by your health care provider. °· Do not take over-the-counter NSAIDs, including aspirin and ibuprofen, while you are on anticoagulant therapy. These medicines increase your risk of dangerous bleeding. °· Get approval from your health care provider before you start taking any new medicines, vitamins, or herbal products. Some of these could interfere with your therapy. °General instructions °· Keep all follow-up visits as told by your health care provider. This is important. °· If you are pregnant or trying to get pregnant, talk with a health care provider about anticoagulants. Some of these medicines are not safe to take during pregnancy. °· Tell all health care providers, including your dentist, that you are on anticoagulant therapy. It is especially important to tell providers before you have any surgery, medical procedures, or dental work done. °What precautions should I take? ° °· Be very careful when using knives, scissors, or other sharp objects. °· Use an electric razor instead of a blade. °· Do not use toothpicks. °· Use a soft-bristled toothbrush. Brush your teeth gently. °· Always wear shoes outdoors and wear slippers indoors. °· Be careful when cutting your fingernails and toenails. °· Place bath mats in the bathroom. If possible, install handrails as well. °· Wear gloves while you do   yard work. °· Wear your seat belt. °· Prevent falls by removing loose rugs and extension cords from areas where you walk. Use a cane or walker if you need it. °· Avoid  constipation by: °? Drinking enough fluid to keep your urine clear or pale yellow. °? Eating foods that are high in fiber, such as fresh fruits and vegetables, whole grains, and beans. °? Limiting foods that are high in fat and processed sugars, such as fried and sweet foods. °· Do not play contact sports or participate in other activities that have a high risk for injury. °What other precautions are important if on warfarin therapy? °If you are taking a type of anticoagulant called warfarin, make sure you: °· Work with a diet and nutrition specialist (dietitian) to make an eating plan. Do not make any sudden changes to your diet after you have started your eating plan. °· Do not drink alcohol. It can interfere with your medicine and increase your risk of an injury that causes bleeding. °· Get regular blood tests as told by your health care provider. °What are some questions to ask my health care provider? °· Why do I need anticoagulant therapy? °· What is the best anticoagulant therapy for my condition? °· How long will I need anticoagulant therapy? °· What are the side effects of anticoagulant therapy? °· When should I take my medicine? What should I do if I forget to take it? °· Will I need to have regular blood tests? °· Do I need to change my diet? Are there foods or drinks that I should avoid? °· What activities are safe for me? °· What should I do if I want to get pregnant? °Contact a health care provider if: °· You miss a dose of medicine: °? And you are not sure what to do. °? For more than one day. °· You have: °? Menstrual bleeding that is heavier than normal. °? Bloody or brown urine. °? Easy bruising. °? Black and tarry stool or bright red stool. °? Side effects from your medicine. °· You feel weak or dizzy. °· You become pregnant. °Get help right away if: °· You have bleeding that will not stop within 20 minutes from: °? The nose. °? The gums. °? A cut on the skin. °· You have a severe headache or  stomachache. °· You vomit or cough up blood. °· You fall or hit your head. °Summary °· Anticoagulant therapy, also called blood thinner therapy, is medicine that helps to prevent and treat blood clots. °· Anticoagulants work in different ways to prevent blood clots. They also have different risks and side effects. °· Talk with your health care provider about any precautions that you should take while on anticoagulant therapy. °This information is not intended to replace advice given to you by your health care provider. Make sure you discuss any questions you have with your health care provider. °Document Released: 03/07/2015 Document Revised: 07/16/2018 Document Reviewed: 06/12/2016 °Elsevier Patient Education © 2020 [employer] ° °

## 2018-11-07 ENCOUNTER — Other Ambulatory Visit (INDEPENDENT_AMBULATORY_CARE_PROVIDER_SITE_OTHER): Payer: Medicare Other | Admitting: Nurse Practitioner

## 2018-11-07 ENCOUNTER — Encounter: Payer: Self-pay | Admitting: Nurse Practitioner

## 2018-11-07 ENCOUNTER — Other Ambulatory Visit: Payer: Self-pay

## 2018-11-07 VITALS — BP 138/72 | HR 71 | Temp 97.3°F | Ht 62.0 in | Wt 185.0 lb

## 2018-11-07 DIAGNOSIS — I480 Paroxysmal atrial fibrillation: Secondary | ICD-10-CM

## 2018-11-07 DIAGNOSIS — R791 Abnormal coagulation profile: Secondary | ICD-10-CM | POA: Diagnosis not present

## 2018-11-07 DIAGNOSIS — Z7901 Long term (current) use of anticoagulants: Secondary | ICD-10-CM

## 2018-11-07 LAB — COAGUCHEK XS/INR WAIVED
INR: 2.4 — ABNORMAL HIGH (ref 0.9–1.1)
Prothrombin Time: 28.6 s

## 2018-11-07 NOTE — Progress Notes (Addendum)
Subjective:   CHIEF COMPLAINT: INR recheck * INR was 6.4 yesterday and she was given vitamin K and was told to come in for recheck today.   Indication: atrial fibrillation Bleeding signs/symptoms: None Thromboembolic signs/symptoms: None  Missed Coumadin doses: held dose yesterday Medication changes: no Dietary changes: no Bacterial/viral infection: no Other concerns: no  The following portions of the patient's history were reviewed and updated as appropriate: allergies, current medications, past family history, past medical history, past social history, past surgical history and problem list.  Review of Systems Pertinent items noted in HPI and remainder of comprehensive ROS otherwise negative.   Objective:    INR Today: 2.4 Current dose: coumadin 5mg  daily except 7.5mg  on M and F    Assessment:    Therapeutic INR for goal of 2-3   Plan:    1. New dose: Change to coumadin 5mg  daily except 7.5mg  on mondays   2. Next INR: 1 week    Mary-Margaret Hassell Done, FNP

## 2018-11-12 ENCOUNTER — Other Ambulatory Visit: Payer: Self-pay

## 2018-11-13 ENCOUNTER — Ambulatory Visit (INDEPENDENT_AMBULATORY_CARE_PROVIDER_SITE_OTHER): Payer: Medicare Other | Admitting: Family

## 2018-11-13 ENCOUNTER — Encounter: Payer: Self-pay | Admitting: Family

## 2018-11-13 DIAGNOSIS — I480 Paroxysmal atrial fibrillation: Secondary | ICD-10-CM

## 2018-11-13 DIAGNOSIS — K219 Gastro-esophageal reflux disease without esophagitis: Secondary | ICD-10-CM

## 2018-11-13 DIAGNOSIS — Z7901 Long term (current) use of anticoagulants: Secondary | ICD-10-CM

## 2018-11-13 DIAGNOSIS — M1712 Unilateral primary osteoarthritis, left knee: Secondary | ICD-10-CM

## 2018-11-13 LAB — COAGUCHEK XS/INR WAIVED
INR: 1.9 — ABNORMAL HIGH (ref 0.9–1.1)
Prothrombin Time: 23.3 s

## 2018-11-13 MED ORDER — METFORMIN HCL 1000 MG PO TABS: ORAL_TABLET | ORAL | 2 refills | Status: DC

## 2018-11-13 MED ORDER — PANTOPRAZOLE SODIUM 40 MG PO TBEC: 40.0000 mg | DELAYED_RELEASE_TABLET | Freq: Two times a day (BID) | ORAL | 3 refills | Status: DC

## 2018-11-13 MED ORDER — HYDROCODONE-ACETAMINOPHEN 5-325 MG PO TABS: 1.0000 | ORAL_TABLET | ORAL | 0 refills | Status: DC | PRN

## 2018-11-13 NOTE — Progress Notes (Signed)
Subjective:    Patient ID: Rhonda Garcia, female    DOB: 04/10/1940, 78 y.o.   MRN: [id number]  Chief Complaint  Patient presents with  . protime recheck   PT presents to the office today to recheck INR. She was seen on 11/06/18 with an INR of 6.4 and was given K+ and told to hold her dose. She returned the next day and had an INR of 2.4. Her INR today is 1.9. See anticoagulation flow sheet.   Arthritis Presents for follow-up visit. She complains of pain, stiffness and joint warmth. The symptoms have been stable. Affected locations include the right knee, left knee, right MCP and left MCP (back). Her pain is at a severity of 8/10.      Review of Systems  Musculoskeletal: Positive for arthritis and stiffness.  All other systems reviewed and are negative.      Objective:   Physical Exam Vitals signs reviewed.  Constitutional:      General: She is not in acute distress.    Appearance: She is well-developed. She is obese.  HENT:     Head: Normocephalic and atraumatic.     Right Ear: Tympanic membrane normal.     Left Ear: Tympanic membrane normal.  Eyes:     Pupils: Pupils are equal, round, and reactive to light.  Neck:     Musculoskeletal: Normal range of motion and neck supple.     Thyroid: No thyromegaly.  Cardiovascular:     Rate and Rhythm: Normal rate and regular rhythm.     Heart sounds: Normal heart sounds. No murmur.  Pulmonary:     Effort: Pulmonary effort is normal. No respiratory distress.     Breath sounds: Normal breath sounds. No wheezing.  Abdominal:     General: Bowel sounds are normal. There is no distension.     Palpations: Abdomen is soft.     Tenderness: There is no abdominal tenderness.  Musculoskeletal: Normal range of motion.        General: Tenderness (bilateral knees with flexion and extension) present.  Skin:    General: Skin is warm and dry.  Neurological:     Mental Status: She is alert and oriented to person, place, and time.     Cranial  Nerves: No cranial nerve deficit.     Deep Tendon Reflexes: Reflexes are normal and symmetric.  Psychiatric:        Behavior: Behavior normal.        Thought Content: Thought content normal.        Judgment: Judgment normal.       BP 107/64   Pulse 73   Temp (!) 96.4 F (35.8 C) (Oral)   Ht 5\' 2"  (1.575 m)   Wt 189 lb (85.7 kg)   BMI 34.57 kg/m      Assessment & Plan:  Rhonda Garcia comes in today with chief complaint of protime recheck   Diagnosis and orders addressed:  1. Gastroesophageal reflux disease without esophagitis - pantoprazole (PROTONIX) 40 MG tablet; Take 1 tablet (40 mg total) by mouth 2 (two) times daily.  Dispense: 180 tablet; Refill: 3  2. Paroxysmal atrial fibrillation (HCC) - CoaguChek XS/INR Waived  3. Long term current use of anticoagulant therapy - CoaguChek XS/INR Waived Description   INR today 1.9 (goal is 2-3)   (too thick)  Continue current dose of 5 mg (1 tablet) every except Monday and Friday of 2.5 mg (1/2 tablet)  4. Primary osteoarthritis of left knee - HYDROcodone-acetaminophen (NORCO) 5-325 MG tablet; Take 1 tablet by mouth every 4 (four) hours as needed for moderate pain or severe pain.  Dispense: 30 tablet; Refill: 0 PT reviewed in [place] controlled database- No redflags noted RTO in 2 weeks    Evelina Dun, FNP

## 2018-11-13 NOTE — Patient Instructions (Signed)

## 2018-11-27 DIAGNOSIS — E119 Type 2 diabetes mellitus without complications: Secondary | ICD-10-CM | POA: Diagnosis not present

## 2018-12-02 ENCOUNTER — Telehealth: Payer: Self-pay | Admitting: *Deleted

## 2018-12-02 ENCOUNTER — Telehealth: Payer: Self-pay | Admitting: Family

## 2018-12-02 NOTE — Telephone Encounter (Signed)
LMOVM to call the office to make appt Received VM from Clarise Cruz w/ [facility] is confused on how to take Pravastatin 80 mg & Lisinopril / HCTZ, she has a lot in her bottle since her refill in May I noticed in her chart that she was due for her INR check, lmtcb to make appt

## 2018-12-02 NOTE — Telephone Encounter (Signed)
Had pro-time check on 11-13-18.  When should she return to check?

## 2018-12-03 ENCOUNTER — Encounter: Payer: Self-pay | Admitting: Internal Medicine

## 2018-12-03 ENCOUNTER — Telehealth: Payer: Self-pay | Admitting: Internal Medicine

## 2018-12-03 ENCOUNTER — Ambulatory Visit: Payer: Medicare Other | Admitting: Nurse Practitioner

## 2018-12-03 NOTE — Telephone Encounter (Signed)
lmtcb

## 2018-12-03 NOTE — Telephone Encounter (Signed)
PATIENT WAS A NO SHOW AND LETTER SENT  °

## 2018-12-03 NOTE — Progress Notes (Deleted)
Referring Provider: Sharion Balloon, FNP Primary Care Physician:  Sharion Balloon, FNP Primary GI:  Dr. Gala Romney  No chief complaint on file.   HPI:   Rhonda Garcia is a 78 y.o. female who presents for follow-up on anemia.  Patient was last seen in our office 07/22/2018 for constipation, anemia, history of adenomatous colon polyps.  Her last visit was a virtual office visit due to COVID-19/coronavirus pandemic.  At that time noted colonoscopy up-to-date 03/19/2016 with internal hemorrhoids, diverticulosis in the entire colon, a 1 cm lipoma in the ascending segment, otherwise normal.  Recommended repeat in 3 years (2020).  EGD at the same time found LA grade a esophagitis status post dilation.  Historically has done well on Linzess for constipation.  Initial evaluation by primary care for anemia on 05/08/2018 is a drop in hemoglobin to 10.5 from 12 the week prior.  CT scan without acute findings, iFOBT positive.  On chronic anticoagulation with warfarin for A. fib.  Labs on 05/08/2018 found normal iron, normal ferritin (34), normal hemoglobin at 11.3, normal platelets at 179.  Another repeat CBC on 07/07/2018 found stable hemoglobin at 11.7.  At her last visit states initially [place] worked well but it started taking longer to work and wants to return to 145 dose.  Bowel movement every couple days with mild discomfort that improves after stooling.  Intermittent constipation associated with intermittent nausea and poor appetite.  Denies hematochezia.  Occasional dark stools but not pitch black or tarry.  No other GI complaints.  Recommended increase Linzess to 145 mcg daily, MiraLAX 1-2 times a day as needed, colonoscopy, follow-up in 4 months.  Colonoscopy completed 10/02/2018 which found diverticulosis in the sigmoid colon and descending colon, two 4 to 5 mm polyps in the ascending colon, otherwise normal.  Surgical pathology, polyps to be tubular adenoma.  Recommended no future colonoscopy unless new  symptoms develop.  Most recent CBC completed 09/04/2018 which found significantly improved/normalized hemoglobin at 13.1, normal platelet count of 163.  Normocytic, normochromic indices.  She states   Past Medical History:  Diagnosis Date   A-fib ([place])    Anal fissure    resolved    Anemia    Back pain    with left radiculopathy   CAD (coronary artery disease)    Cataract    Collagen vascular disease (HCC)    DDD (degenerative disc disease)    Depression    Diabetes mellitus    Diverticulosis    Dyslipidemia    Dysrhythmia    AFib   Esophagitis    GERD (gastroesophageal reflux disease)    Hiatal hernia    Hx of peptic ulcer    Hyperlipidemia    Hypertension    Hypothyroidism    Internal hemorrhoids 2017   NSTEMI (non-ST elevated myocardial infarction) ([place]) 08/05/2015   Osteopenia    Sleep apnea    not using CPAP now, could not tolerate and PCP aware.   Thyroid disease     Past Surgical History:  Procedure Laterality Date   ABDOMINAL HYSTERECTOMY Bilateral 2001 - approximate   APPENDECTOMY     BREAST REDUCTION SURGERY     CARDIAC CATHETERIZATION N/A 08/08/2015   Procedure: Left Heart Cath and Coronary Angiography;  Surgeon: Burnell Blanks, MD;  Location: [facility];  Service: Cardiovascular;  Laterality: N/A;   CHOLECYSTECTOMY  2008 - approximate   COLONOSCOPY  06/2007   Friable anal canal and anal papillae. Pancolonic diverticula. Normal  terminal ileum. Status post segmental biopsy, descending colon biopsy showed minimal cryptitis. Biopsies felt to be  nonspecific but could be seen with NSAIDs. No features of inflammatory bowel disease. Stool studies were negative.   COLONOSCOPY  03/21/2011   RMR: colonic polyps treated as described above, colonic diverticulosis (Tubular adenomas)   COLONOSCOPY WITH PROPOFOL N/A 06/17/2014   RMR: Colonic diverticulosis. Multiple colonic polyps removed as described above.    COLONOSCOPY  WITH PROPOFOL N/A 03/19/2016   Procedure: COLONOSCOPY WITH PROPOFOL;  Surgeon: Daneil Dolin, MD;  [facility]: [facility];  Service: Endoscopy;  Laterality: N/A;  8:45am   COLONOSCOPY WITH PROPOFOL N/A 10/02/2018   Procedure: COLONOSCOPY WITH PROPOFOL;  Surgeon: Daneil Dolin, MD;  [facility];  Service: Endoscopy;  Laterality: N/A;  9:15am   CORONARY ANGIOPLASTY WITH STENT PLACEMENT     4 stents   ESOPHAGEAL DILATION N/A 06/17/2014   Procedure: ESOPHAGEAL DILATION;  Surgeon: Daneil Dolin, MD;  Location: [facility];  Service: Endoscopy;  Laterality: N/A;  Maloney 51 Fr   ESOPHAGOGASTRODUODENOSCOPY  06/2007   Small hiatal hernia   ESOPHAGOGASTRODUODENOSCOPY  03/21/2011   RMR: normal esophagus- status post passage of Maloney dilator. Small hiatal hernia. Trivial antral and bulbar erosions   ESOPHAGOGASTRODUODENOSCOPY (EGD) WITH PROPOFOL N/A 06/17/2014   RMR: Small hiatal hernia; otherwise normal EGD. Status post passage of a Maloney dilator. No explanation for patients right upper quadrant abdominal pain.    ESOPHAGOGASTRODUODENOSCOPY (EGD) WITH PROPOFOL N/A 03/19/2016   Procedure: ESOPHAGOGASTRODUODENOSCOPY (EGD) WITH PROPOFOL;  Surgeon: Daneil Dolin, MD;  [facility];  Service: Endoscopy;  Laterality: N/A;   KNEE SURGERY Left    arthroscopy   LIPOMA EXCISION  03/17/2012   Procedure: EXCISION LIPOMA;  Surgeon: Gwenyth Ober, MD;  Location: [facility];  Service: General;  Laterality: Right;  excision of right lower back lipoma   MALONEY DILATION N/A 03/19/2016   Procedure: Venia Minks DILATION;  Surgeon: Daneil Dolin, MD;  [facility];  Service: Endoscopy;  Laterality: N/A;   POLYPECTOMY  06/17/2014   Procedure: POLYPECTOMY;  Surgeon: Daneil Dolin, MD;  Location: [facility];  Service: Endoscopy;;   POLYPECTOMY  10/02/2018   Procedure: POLYPECTOMY;  Surgeon: Daneil Dolin, MD;  [facility];  Service: Endoscopy;;  colon     Current Outpatient Medications  Medication Sig Dispense Refill   acetaminophen (TYLENOL) 325 MG tablet Take 325 mg by mouth every 6 (six) hours as needed for moderate pain.     ALPRAZolam (XANAX) 0.5 MG tablet Take 1 tablet (0.5 mg total) by mouth at bedtime as needed. 30 tablet 4   amLODipine (NORVASC) 5 MG tablet Take 1 tablet (5 mg total) by mouth daily. 90 tablet 3   diphenoxylate-atropine (LOMOTIL) 2.5-0.025 MG tablet Take 2 tablets by mouth 4 (four) times daily as needed for diarrhea or loose stools. 30 tablet 0   escitalopram (LEXAPRO) 10 MG tablet Take 1 tablet (10 mg total) by mouth daily. 90 tablet 3   fluticasone (FLONASE) 50 MCG/ACT nasal spray Place 2 sprays into both nostrils daily. 16 g 6   HYDROcodone-acetaminophen (NORCO) 5-325 MG tablet Take 1 tablet by mouth every 4 (four) hours as needed for moderate pain or severe pain. 30 tablet 0   isosorbide mononitrate (IMDUR) 60 MG 24 hr tablet TAKE ONE (1) TABLET EACH DAY (Patient taking differently: Take 60 mg by mouth daily. ** DO NOT CRUSH **) 90 tablet 3  levothyroxine (SYNTHROID) 50 MCG tablet TAKE ONE (1) TABLET EACH DAY (Patient taking differently: Take 50 mcg by mouth daily before breakfast. ) 90 tablet 3   linaclotide (LINZESS) 145 MCG CAPS capsule Take 1 capsule (145 mcg total) by mouth daily before breakfast. 30 capsule 5   lisinopril-hydrochlorothiazide (PRINZIDE,ZESTORETIC) 20-12.5 MG tablet Take 2 tablets by mouth daily. 180 tablet 6   meclizine (ANTIVERT) 25 MG tablet Take 1 tablet (25 mg total) by mouth 3 (three) times daily as needed for dizziness. 30 tablet 0   metFORMIN (GLUCOPHAGE) 1000 MG tablet TAKE ONE TABLET TWICE A DAY WITH FOOD 180 tablet 2   metoprolol tartrate (LOPRESSOR) 50 MG tablet TAKE ONE TABLET BY MOUTH TWICE DAILY (Patient taking differently: Take 50 mg by mouth 2 (two) times daily. ) 180 tablet 1   nitroGLYCERIN (NITROSTAT) 0.4 MG SL tablet Place 1 tablet (0.4 mg total) under the  tongue every 5 (five) minutes as needed for chest pain. 20 tablet 1   ondansetron (ZOFRAN-ODT) 8 MG disintegrating tablet Take 1 tablet (8 mg total) by mouth every 6 (six) hours as needed for nausea or vomiting. 20 tablet 1   pantoprazole (PROTONIX) 40 MG tablet Take 1 tablet (40 mg total) by mouth 2 (two) times daily. 180 tablet 3   polyethylene glycol-electrolytes (TRILYTE) 420 g solution Take 4,000 mLs by mouth as directed. 4000 mL 0   pravastatin (PRAVACHOL) 80 MG tablet Take 1 tablet (80 mg total) by mouth every evening. NEED OV. 90 tablet 2   sitaGLIPtin (JANUVIA) 100 MG tablet Take 1 tablet (100 mg total) by mouth daily. (Please make 3 mos June appt) 90 tablet 0   spironolactone (ALDACTONE) 25 MG tablet TAKE ONE (1) TABLET EACH DAY (Patient taking differently: Take 25 mg by mouth daily. TAKE ONE (1) TABLET EACH DAY) 90 tablet 2   Vitamin D, Ergocalciferol, (DRISDOL) 1.25 MG (50000 UT) CAPS capsule TAKE 1 CAPSULE EVERY 7 DAYS (Patient taking differently: Take 50,000 Units by mouth. TAKE 1 CAPSULE EVERY 7 DAYS) 12 capsule 0   warfarin (COUMADIN) 5 MG tablet TAKE 1 TO 1&1/2 TABLET DAILY AS DIRECTED (Patient taking differently: Take by mouth daily. Take 2.5 mg every Monday and Friday, then take 5 mg daily on Sun, Tues, Wed, Thurs, Sat, Sun.) 135 tablet 1   No current facility-administered medications for this visit.     Allergies as of 12/03/2018 - Review Complete 11/13/2018  Allergen Reaction Noted   Promethazine hcl Anaphylaxis 09/06/2010    Family History  Problem Relation Age of Onset   Heart attack Mother    Hypertension Mother    Stroke Mother        Deceased, age 85   Diabetes Sister    Diabetes Sister    Cancer Sister        ovarian   Stroke Sister    Diabetes Brother        also has a blood disorder    Clotting disorder Brother    Kidney disease Brother    Heart disease Brother    Melanoma Brother        deceased   Coronary artery disease Other     Kidney disease Other    Diabetes Son    Hypertension Son    Coronary artery disease Son 48       CABG   Hypertension Son    Coronary artery disease Son 10       CABG   Colon cancer Neg Hx  Social History   Socioeconomic History   Marital status: Married    Spouse name: Not on file   Number of children: 4   Years of education: 6   Highest education level: 6th grade  Occupational History   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: Somewhat hard   Food insecurity    Worry: Never true    Inability: Never true   Transportation needs    Medical: No    Non-medical: No  Tobacco Use   Smoking status: Never Smoker   Smokeless tobacco: Never Used  Substance and Sexual Activity   Alcohol use: Not Currently    Alcohol/week: 1.0 standard drinks    Types: 1 Glasses of wine per week   Drug use: No   Sexual activity: Yes    Birth control/protection: Post-menopausal  Lifestyle   Physical activity    Days per week: 0 days    Minutes per session: 0 min   Stress: Only a little  Relationships   Social connections    Talks on phone: More than three times a week    Gets together: More than three times a week    Attends religious service: More than 4 times per year    Active member of club or organization: Yes    Attends meetings of clubs or organizations: More than 4 times per year    Relationship status: Married  Other Topics Concern   Not on file  Social History Narrative   Widowed but is remarrying in July 2019. Has 4 adult children. Lives in a house with fiance.     Review of Systems: General: Negative for anorexia, weight loss, fever, chills, fatigue, weakness. Eyes: Negative for vision changes.  ENT: Negative for hoarseness, difficulty swallowing , nasal congestion. CV: Negative for chest pain, angina, palpitations, dyspnea on exertion, peripheral edema.  Respiratory: Negative for dyspnea at  rest, dyspnea on exertion, cough, sputum, wheezing.  GI: See history of present illness. GU:  Negative for dysuria, hematuria, urinary incontinence, urinary frequency, nocturnal urination.  MS: Negative for joint pain, low back pain.  Derm: Negative for rash or itching.  Neuro: Negative for weakness, abnormal sensation, seizure, frequent headaches, memory loss, confusion.  Psych: Negative for anxiety, depression, suicidal ideation, hallucinations.  Endo: Negative for unusual weight change.  Heme: Negative for bruising or bleeding. Allergy: Negative for rash or hives.   Physical Exam: There were no vitals taken for this visit. General:   Alert and oriented. Pleasant and cooperative. Well-nourished and well-developed.  Head:  Normocephalic and atraumatic. Eyes:  Without icterus, sclera clear and conjunctiva pink.  Ears:  Normal auditory acuity. Mouth:  No deformity or lesions, oral mucosa pink.  Throat/Neck:  Supple, without mass or thyromegaly. Cardiovascular:  S1, S2 present without murmurs appreciated. Normal pulses noted. Extremities without clubbing or edema. Respiratory:  Clear to auscultation bilaterally. No wheezes, rales, or rhonchi. No distress.  Gastrointestinal:  +BS, soft, non-tender and non-distended. No HSM noted. No guarding or rebound. No masses appreciated.  Rectal:  Deferred  Musculoskalatal:  Symmetrical without gross deformities. Normal posture. Skin:  Intact without significant lesions or rashes. Neurologic:  Alert and oriented x4;  grossly normal neurologically. Psych:  Alert and cooperative. Normal mood and affect. Heme/Lymph/Immune: No significant cervical adenopathy. No excessive bruising noted.    12/03/2018 8:17 AM   Disclaimer: This note was dictated with voice recognition software. Similar sounding words can inadvertently be transcribed and  may not be corrected upon review.

## 2018-12-03 NOTE — Telephone Encounter (Signed)
Please make pt an appt this week

## 2018-12-05 ENCOUNTER — Ambulatory Visit (INDEPENDENT_AMBULATORY_CARE_PROVIDER_SITE_OTHER): Payer: Medicare Other | Admitting: Family

## 2018-12-05 ENCOUNTER — Encounter: Payer: Self-pay | Admitting: Family

## 2018-12-05 ENCOUNTER — Other Ambulatory Visit: Payer: Self-pay

## 2018-12-05 VITALS — BP 111/64 | HR 71 | Temp 97.0°F | Ht 62.0 in | Wt 184.0 lb

## 2018-12-05 DIAGNOSIS — K219 Gastro-esophageal reflux disease without esophagitis: Secondary | ICD-10-CM | POA: Diagnosis not present

## 2018-12-05 DIAGNOSIS — I152 Hypertension secondary to endocrine disorders: Secondary | ICD-10-CM

## 2018-12-05 DIAGNOSIS — E1165 Type 2 diabetes mellitus with hyperglycemia: Secondary | ICD-10-CM

## 2018-12-05 DIAGNOSIS — E1159 Type 2 diabetes mellitus with other circulatory complications: Secondary | ICD-10-CM

## 2018-12-05 DIAGNOSIS — D649 Anemia, unspecified: Secondary | ICD-10-CM

## 2018-12-05 DIAGNOSIS — I10 Essential (primary) hypertension: Secondary | ICD-10-CM

## 2018-12-05 DIAGNOSIS — I25110 Atherosclerotic heart disease of native coronary artery with unstable angina pectoris: Secondary | ICD-10-CM

## 2018-12-05 DIAGNOSIS — F411 Generalized anxiety disorder: Secondary | ICD-10-CM

## 2018-12-05 DIAGNOSIS — I480 Paroxysmal atrial fibrillation: Secondary | ICD-10-CM | POA: Diagnosis not present

## 2018-12-05 DIAGNOSIS — Z7901 Long term (current) use of anticoagulants: Secondary | ICD-10-CM

## 2018-12-05 DIAGNOSIS — R350 Frequency of micturition: Secondary | ICD-10-CM

## 2018-12-05 DIAGNOSIS — E1169 Type 2 diabetes mellitus with other specified complication: Secondary | ICD-10-CM

## 2018-12-05 DIAGNOSIS — K5900 Constipation, unspecified: Secondary | ICD-10-CM

## 2018-12-05 DIAGNOSIS — E785 Hyperlipidemia, unspecified: Secondary | ICD-10-CM

## 2018-12-05 DIAGNOSIS — E039 Hypothyroidism, unspecified: Secondary | ICD-10-CM

## 2018-12-05 DIAGNOSIS — E559 Vitamin D deficiency, unspecified: Secondary | ICD-10-CM

## 2018-12-05 LAB — MICROSCOPIC EXAMINATION
RBC, Urine: NONE SEEN /hpf (ref 0–2)
Renal Epithel, UA: NONE SEEN /hpf

## 2018-12-05 LAB — URINALYSIS, COMPLETE
Bilirubin, UA: NEGATIVE
Glucose, UA: NEGATIVE
Ketones, UA: NEGATIVE
Leukocytes,UA: NEGATIVE
Nitrite, UA: NEGATIVE
Protein,UA: NEGATIVE
RBC, UA: NEGATIVE
Specific Gravity, UA: 1.025 (ref 1.005–1.030)
Urobilinogen, Ur: 1 mg/dL (ref 0.2–1.0)
pH, UA: 5 (ref 5.0–7.5)

## 2018-12-05 LAB — BAYER DCA HB A1C WAIVED: HB A1C (BAYER DCA - WAIVED): 7.5 % — ABNORMAL HIGH (ref <7.0)

## 2018-12-05 LAB — COAGUCHEK XS/INR WAIVED
INR: 4.8 — ABNORMAL HIGH (ref 0.9–1.1)
Prothrombin Time: 57.2 s

## 2018-12-05 NOTE — Patient Instructions (Signed)

## 2018-12-05 NOTE — Addendum Note (Signed)
Addended by: Evelina Dun A on: 12/05/2018 02:40 PM   Modules accepted: Orders

## 2018-12-05 NOTE — Addendum Note (Signed)
Addended by: Liliane Bade on: 12/05/2018 02:41 PM   Modules accepted: Orders

## 2018-12-05 NOTE — Progress Notes (Signed)
Subjective:    Patient ID: Rhonda Garcia, female    DOB: 1940/05/06, 78 y.o.   MRN: [id number]  Chief Complaint  Patient presents with  . protime recheck   Pt presents to the office today for chronic follow up and INR recheck. She states she has been having abdominal pain. She is followed by GI and just had a colonoscopy 10/02/2018 that was normal. Diabetes She presents for her follow-up diabetic visit. She has type 2 diabetes mellitus. Her disease course has been stable. Hypoglycemia symptoms include nervousness/anxiousness. Associated symptoms include fatigue. Pertinent negatives for diabetes include no blurred vision, no chest pain, no foot paresthesias and no visual change. There are no hypoglycemic complications. Symptoms are stable. Diabetic complications include heart disease and peripheral neuropathy. Risk factors for coronary artery disease include dyslipidemia, diabetes mellitus, hypertension, sedentary lifestyle and post-menopausal. She is following a generally unhealthy diet. Her overall blood glucose range is 140-180 mg/dl. Eye exam is not current.  Hyperlipidemia This is a chronic problem. The current episode started more than 1 year ago. The problem is controlled. Recent lipid tests were reviewed and are normal. Exacerbating diseases include obesity. Pertinent negatives include no chest pain. Current antihyperlipidemic treatment includes statins. The current treatment provides moderate improvement of lipids. Risk factors for coronary artery disease include dyslipidemia, diabetes mellitus, hypertension, a sedentary lifestyle and post-menopausal.  Thyroid Problem Presents for follow-up visit. Symptoms include anxiety, constipation, depressed mood and fatigue. Patient reports no visual change. The symptoms have been stable. Her past medical history is significant for hyperlipidemia.  Constipation This is a chronic problem. The current episode started more than 1 year ago. The problem  has been waxing and waning since onset. Her stool frequency is 4 to 5 times per week. Associated symptoms include abdominal pain and nausea. Pertinent negatives include no vomiting. She has tried laxatives for the symptoms. The treatment provided moderate relief.  Anxiety Presents for follow-up visit. Symptoms include decreased concentration, depressed mood, excessive worry, irritability, nausea, nervous/anxious behavior and restlessness. Patient reports no chest pain. Symptoms occur most days. The severity of symptoms is moderate.   Her past medical history is significant for anemia.  Anemia Presents for follow-up visit. Symptoms include abdominal pain and malaise/fatigue.  Depression        This is a chronic problem.  The current episode started more than 1 year ago.   The onset quality is gradual.   The problem occurs intermittently.  The problem has been waxing and waning since onset.  Associated symptoms include decreased concentration, fatigue, irritable, restlessness and sad.  Associated symptoms include no helplessness and no hopelessness.  Past medical history includes thyroid problem and anxiety.   Urinary Frequency  This is a new problem. The current episode started 1 to 4 weeks ago. The problem occurs intermittently. The problem has been waxing and waning. The pain is at a severity of 0/10. Associated symptoms include frequency and nausea. Pertinent negatives include no hematuria, hesitancy, urgency or vomiting.  Gastroesophageal Reflux She complains of abdominal pain, heartburn and nausea. She reports no belching, no chest pain or no dysphagia. This is a chronic problem. The current episode started more than 1 year ago. The problem occurs occasionally. Associated symptoms include fatigue.  A Fib Pt currently taking warfarin. See anticoagulation flow sheet.     Review of Systems  Constitutional: Positive for fatigue, irritability and malaise/fatigue.  Eyes: Negative for blurred  vision.  Cardiovascular: Negative for chest pain.  Gastrointestinal: Positive for abdominal  pain, constipation, heartburn and nausea. Negative for dysphagia and vomiting.  Genitourinary: Positive for frequency. Negative for hematuria, hesitancy and urgency.  Psychiatric/Behavioral: Positive for decreased concentration and depression. The patient is nervous/anxious.   All other systems reviewed and are negative.      Objective:   Physical Exam Vitals signs reviewed.  Constitutional:      General: She is irritable. She is not in acute distress.    Appearance: She is well-developed. She is obese.  HENT:     Head: Normocephalic and atraumatic.     Right Ear: Tympanic membrane normal.     Left Ear: Tympanic membrane normal.  Eyes:     Pupils: Pupils are equal, round, and reactive to light.  Neck:     Musculoskeletal: Normal range of motion and neck supple.     Thyroid: No thyromegaly.  Cardiovascular:     Rate and Rhythm: Normal rate and regular rhythm.     Heart sounds: Normal heart sounds. No murmur.  Pulmonary:     Effort: Pulmonary effort is normal. No respiratory distress.     Breath sounds: Normal breath sounds. No wheezing.  Abdominal:     General: Bowel sounds are normal. There is no distension.     Palpations: Abdomen is soft.     Tenderness: There is abdominal tenderness (mild tenderness LUQ & LLL).  Musculoskeletal: Normal range of motion.        General: No tenderness.  Skin:    General: Skin is warm and dry.  Neurological:     Mental Status: She is alert and oriented to person, place, and time.     Cranial Nerves: No cranial nerve deficit.     Deep Tendon Reflexes: Reflexes are normal and symmetric.  Psychiatric:        Behavior: Behavior normal.        Thought Content: Thought content normal.        Judgment: Judgment normal.     Diabetic Foot Exam - Simple   Simple Foot Form Diabetic Foot exam was performed with the following findings: Yes 12/05/2018  2:16  PM  Visual Inspection No deformities, no ulcerations, no other skin breakdown bilaterally: Yes Sensation Testing Intact to touch and monofilament testing bilaterally: Yes Pulse Check Posterior Tibialis and Dorsalis pulse intact bilaterally: Yes Comments      BP 111/64   Pulse 71   Temp (!) 97 F (36.1 C) (Oral)   Ht _0  (1.575 m)   Wt 184 lb (83.5 kg)   BMI 33.65 kg/m      Assessment & Plan:  Rhonda Garcia comes in today with chief complaint of protime recheck   Diagnosis and orders addressed:  1. Coronary artery disease involving native coronary artery of native heart with unstable angina pectoris ([place]) - CMP14+EGFR  2. Hypertension associated with diabetes ([place]) - CMP14+EGFR  3. Paroxysmal atrial fibrillation (HCC) - CMP14+EGFR  4. Gastroesophageal reflux disease without esophagitis - CMP14+EGFR  5. Hyperlipidemia associated with type 2 diabetes mellitus (HCC) - CMP14+EGFR  6. Hypothyroidism, unspecified type - CMP14+EGFR - TSH  7. Type 2 diabetes mellitus with hyperglycemia, without long-term current use of insulin (HCC) - Bayer DCA Hb A1c Waived - CMP14+EGFR - Microalbumin / creatinine urine ratio  8. Constipation, unspecified constipation type - CMP14+EGFR  9. Anemia, unspecified type - CMP14+EGFR - Anemia Profile B  10. GAD (generalized anxiety disorder) - CMP14+EGFR  11. Morbid obesity ([place]) - CMP14+EGFR  12. Vitamin D deficiency - CMP14+EGFR -  VITAMIN D 25 Hydroxy (Vit-D Deficiency, Fractures)  13. Long term current use of anticoagulant therapy Description   INR today 4.8 (goal is 2-3)   (too thin)  Hold today's dose (12/05/18) then decrease to dose to  2.5 mg (1/2 tablet) on Monday, Wednesday, and Friday. Then  5 mg all other days.       - CMP14+EGFR  14. Urinary frequency - CMP14+EGFR - Urinalysis, Complete   Labs pending Health Maintenance reviewed Diet and exercise encouraged  Follow up plan: 1 week to recheck INR    Evelina Dun, FNP

## 2018-12-06 LAB — ANEMIA PROFILE B
Basophils Absolute: 0.1 10*3/uL (ref 0.0–0.2)
Basos: 1 %
EOS (ABSOLUTE): 0.1 10*3/uL (ref 0.0–0.4)
Eos: 2 %
Ferritin: 134 ng/mL (ref 15–150)
Folate: 5.5 ng/mL (ref >3.0)
Hematocrit: 35.1 % (ref 34.0–46.6)
Hemoglobin: 11.7 g/dL (ref 11.1–15.9)
Immature Grans (Abs): 0.1 10*3/uL (ref 0.0–0.1)
Immature Granulocytes: 1 %
Iron Saturation: 16 % (ref 15–55)
Iron: 40 ug/dL (ref 27–139)
Lymphocytes Absolute: 1.7 10*3/uL (ref 0.7–3.1)
Lymphs: 21 %
MCH: 30.7 pg (ref 26.6–33.0)
MCHC: 33.3 g/dL (ref 31.5–35.7)
MCV: 92 fL (ref 79–97)
Monocytes Absolute: 0.8 10*3/uL (ref 0.1–0.9)
Monocytes: 10 %
Neutrophils Absolute: 5.3 10*3/uL (ref 1.4–7.0)
Neutrophils: 65 %
Platelets: 180 10*3/uL (ref 150–450)
RBC: 3.81 x10E6/uL (ref 3.77–5.28)
RDW: 14.4 % (ref 11.7–15.4)
Retic Ct Pct: 1.5 % (ref 0.6–2.6)
Total Iron Binding Capacity: 248 ug/dL — ABNORMAL LOW (ref 250–450)
UIBC: 208 ug/dL (ref 118–369)
Vitamin B-12: 339 pg/mL (ref 232–1245)
WBC: 8 10*3/uL (ref 3.4–10.8)

## 2018-12-06 LAB — CMP14+EGFR
ALT: 13 IU/L (ref 0–32)
AST: 16 IU/L (ref 0–40)
Albumin/Globulin Ratio: 2 (ref 1.2–2.2)
Albumin: 4.3 g/dL (ref 3.7–4.7)
Alkaline Phosphatase: 96 IU/L (ref 39–117)
BUN/Creatinine Ratio: 21 (ref 12–28)
BUN: 26 mg/dL (ref 8–27)
Bilirubin Total: 0.5 mg/dL (ref 0.0–1.2)
CO2: 23 mmol/L (ref 20–29)
Calcium: 9.4 mg/dL (ref 8.7–10.3)
Chloride: 100 mmol/L (ref 96–106)
Creatinine, Ser: 1.25 mg/dL — ABNORMAL HIGH (ref 0.57–1.00)
GFR calc Af Amer: 48 mL/min/{1.73_m2} — ABNORMAL LOW (ref >59)
GFR calc non Af Amer: 42 mL/min/{1.73_m2} — ABNORMAL LOW (ref >59)
Globulin, Total: 2.1 g/dL (ref 1.5–4.5)
Glucose: 132 mg/dL — ABNORMAL HIGH (ref 65–99)
Potassium: 4.7 mmol/L (ref 3.5–5.2)
Sodium: 138 mmol/L (ref 134–144)
Total Protein: 6.4 g/dL (ref 6.0–8.5)

## 2018-12-06 LAB — VITAMIN D 25 HYDROXY (VIT D DEFICIENCY, FRACTURES): Vit D, 25-Hydroxy: 54.7 ng/mL (ref 30.0–100.0)

## 2018-12-06 LAB — TSH: TSH: 0.779 u[IU]/mL (ref 0.450–4.500)

## 2018-12-06 LAB — MICROALBUMIN / CREATININE URINE RATIO
Creatinine, Urine: 116.2 mg/dL
Microalb/Creat Ratio: 4 mg/g creat (ref 0–29)
Microalbumin, Urine: 5.1 ug/mL

## 2018-12-10 NOTE — Telephone Encounter (Signed)
Patient was seen 12/05/2018

## 2018-12-12 ENCOUNTER — Other Ambulatory Visit: Payer: Self-pay

## 2018-12-12 ENCOUNTER — Encounter: Payer: Self-pay | Admitting: Family

## 2018-12-12 ENCOUNTER — Ambulatory Visit (INDEPENDENT_AMBULATORY_CARE_PROVIDER_SITE_OTHER): Payer: Medicare Other | Admitting: Family

## 2018-12-12 VITALS — BP 97/61 | HR 67 | Temp 97.8°F | Ht 62.0 in | Wt 186.0 lb

## 2018-12-12 DIAGNOSIS — Z7901 Long term (current) use of anticoagulants: Secondary | ICD-10-CM

## 2018-12-12 DIAGNOSIS — I959 Hypotension, unspecified: Secondary | ICD-10-CM

## 2018-12-12 DIAGNOSIS — I480 Paroxysmal atrial fibrillation: Secondary | ICD-10-CM | POA: Diagnosis not present

## 2018-12-12 LAB — COAGUCHEK XS/INR WAIVED
INR: 4 — ABNORMAL HIGH (ref 0.9–1.1)
Prothrombin Time: 48.1 s

## 2018-12-12 NOTE — Addendum Note (Signed)
Addended by: Liliane Bade on: 12/12/2018 01:37 PM   Modules accepted: Orders

## 2018-12-12 NOTE — Patient Instructions (Signed)
Description   INR today 4.0 (goal is 2-3)   (too thin)  Hold today's dose (12/12/18) then decrease to dose to  2.5 mg (1/2 tablet) on Monday, Wednesday,  Friday, and Saturday. Then  5 mg all other days.

## 2018-12-12 NOTE — Progress Notes (Signed)
   Subjective:    Patient ID: Rhonda Garcia, female    DOB: [birth year], 78 y.o.   MRN: [id number]  Chief Complaint  Patient presents with  . recheck protime    HPI Pt presents to the office today to recheck INR. She is currently taking warfarin for A Fib. She was seen on 12/05/18 with an INR of 4.8. No missed doses, extra doses, changes in medications, or diet change. See anticoagulation flow sheet.    Review of Systems  All other systems reviewed and are negative.      Objective:   Physical Exam Vitals signs reviewed.  Constitutional:      General: She is not in acute distress.    Appearance: She is well-developed.  HENT:     Head: Normocephalic and atraumatic.     Right Ear: Tympanic membrane normal.     Left Ear: Tympanic membrane normal.  Eyes:     Pupils: Pupils are equal, round, and reactive to light.  Neck:     Musculoskeletal: Normal range of motion and neck supple.     Thyroid: No thyromegaly.  Cardiovascular:     Rate and Rhythm: Normal rate and regular rhythm.     Heart sounds: Normal heart sounds. No murmur.  Pulmonary:     Effort: Pulmonary effort is normal. No respiratory distress.     Breath sounds: Normal breath sounds. No wheezing.  Abdominal:     General: Bowel sounds are normal. There is no distension.     Palpations: Abdomen is soft.     Tenderness: There is no abdominal tenderness.  Musculoskeletal: Normal range of motion.        General: No tenderness.  Skin:    General: Skin is warm and dry.  Neurological:     Mental Status: She is alert and oriented to person, place, and time.     Cranial Nerves: No cranial nerve deficit.     Deep Tendon Reflexes: Reflexes are normal and symmetric.  Psychiatric:        Behavior: Behavior normal.        Thought Content: Thought content normal.        Judgment: Judgment normal.      BP 98/67   Pulse 72   Temp 97.8 F (36.6 C) (Temporal)   Ht 5\' 2"  (1.575 m)   Wt 186 lb (84.4 kg)   BMI 34.02 kg/m       Assessment & Plan:  Rhonda Garcia comes in today with chief complaint of recheck protime   Diagnosis and orders addressed:  1. Paroxysmal atrial fibrillation (HCC) Description   INR today 4.0 (goal is 2-3)   (too thin)  Hold today's dose (12/12/18) then decrease to dose to  2.5 mg (1/2 tablet) on Monday, Wednesday,  Friday, and Saturday. Then  5 mg all other days.        2. Long term current use of anticoagulant therapy  3. Hypotension, unspecified hypotension type Will hold all BP meds today Force fluids Stopped Spironolactone 25 mg Follow up in 1 week to recheck    Evelina Dun, FNP

## 2018-12-16 ENCOUNTER — Other Ambulatory Visit: Payer: Self-pay | Admitting: Family

## 2018-12-18 ENCOUNTER — Other Ambulatory Visit: Payer: Self-pay

## 2018-12-19 ENCOUNTER — Encounter: Payer: Self-pay | Admitting: Family

## 2018-12-19 ENCOUNTER — Ambulatory Visit (INDEPENDENT_AMBULATORY_CARE_PROVIDER_SITE_OTHER): Payer: Medicare Other | Admitting: Family

## 2018-12-19 VITALS — BP 113/64 | HR 67 | Temp 96.0°F | Resp 16 | Ht 62.0 in | Wt 187.0 lb

## 2018-12-19 DIAGNOSIS — I480 Paroxysmal atrial fibrillation: Secondary | ICD-10-CM | POA: Diagnosis not present

## 2018-12-19 DIAGNOSIS — I2510 Atherosclerotic heart disease of native coronary artery without angina pectoris: Secondary | ICD-10-CM | POA: Insufficient documentation

## 2018-12-19 DIAGNOSIS — I252 Old myocardial infarction: Secondary | ICD-10-CM

## 2018-12-19 DIAGNOSIS — R079 Chest pain, unspecified: Secondary | ICD-10-CM | POA: Insufficient documentation

## 2018-12-19 DIAGNOSIS — I25110 Atherosclerotic heart disease of native coronary artery with unstable angina pectoris: Secondary | ICD-10-CM

## 2018-12-19 DIAGNOSIS — Z8679 Personal history of other diseases of the circulatory system: Secondary | ICD-10-CM | POA: Insufficient documentation

## 2018-12-19 DIAGNOSIS — Z23 Encounter for immunization: Secondary | ICD-10-CM | POA: Diagnosis not present

## 2018-12-19 DIAGNOSIS — E1159 Type 2 diabetes mellitus with other circulatory complications: Secondary | ICD-10-CM

## 2018-12-19 DIAGNOSIS — I152 Hypertension secondary to endocrine disorders: Secondary | ICD-10-CM

## 2018-12-19 DIAGNOSIS — R42 Dizziness and giddiness: Secondary | ICD-10-CM

## 2018-12-19 DIAGNOSIS — Z7901 Long term (current) use of anticoagulants: Secondary | ICD-10-CM

## 2018-12-19 DIAGNOSIS — I10 Essential (primary) hypertension: Secondary | ICD-10-CM

## 2018-12-19 DIAGNOSIS — E119 Type 2 diabetes mellitus without complications: Secondary | ICD-10-CM | POA: Insufficient documentation

## 2018-12-19 LAB — COAGUCHEK XS/INR WAIVED
INR: 3 — ABNORMAL HIGH (ref 0.9–1.1)
Prothrombin Time: 35.8 s

## 2018-12-19 MED ORDER — LISINOPRIL-HYDROCHLOROTHIAZIDE 20-12.5 MG PO TABS: 2.0000 | ORAL_TABLET | Freq: Every day | ORAL | 6 refills | Status: DC

## 2018-12-19 MED ORDER — LISINOPRIL-HYDROCHLOROTHIAZIDE 20-12.5 MG PO TABS: 1.0000 | ORAL_TABLET | Freq: Every day | ORAL | 1 refills | Status: DC

## 2018-12-19 NOTE — Addendum Note (Signed)
Addended by: Evelina Dun A on: 12/19/2018 11:27 AM   Modules accepted: Orders

## 2018-12-19 NOTE — Progress Notes (Addendum)
Subjective:    Patient ID: Rhonda Garcia, female    DOB: 20-Oct-1940, 78 y.o.   MRN: [id number]  Chief Complaint  Patient presents with  . protime recheck   PT presents to the office to the office today to recheck INR. She was seen last week and her INR was 4. We decreased her dose. She denies any missed doses, extra doses, bleeding, or bruising. See anticoagulation flow sheet.  She requesting a referral to Cardiologists. She has CAD, A Fib, and HX of NSTEMI.  She also is complaining of dizziness. Last week she was hypotension and we stopped her spironolactone. Today her BP is on the lower side. She states her dizziness comes and goes and is worse when she raises up.  Dizziness This is a recurrent problem. The current episode started more than 1 month ago. The problem occurs intermittently. The problem has been waxing and waning. Associated symptoms include nausea. The symptoms are aggravated by twisting. She has tried rest for the symptoms. The treatment provided mild relief.      Review of Systems  Gastrointestinal: Positive for nausea.  Neurological: Positive for dizziness.  All other systems reviewed and are negative.      Objective:   Physical Exam Vitals signs reviewed.  Constitutional:      General: She is not in acute distress.    Appearance: She is well-developed.  HENT:     Head: Normocephalic and atraumatic.     Right Ear: External ear normal.  Eyes:     Pupils: Pupils are equal, round, and reactive to light.  Neck:     Musculoskeletal: Normal range of motion and neck supple.     Thyroid: No thyromegaly.  Cardiovascular:     Rate and Rhythm: Normal rate and regular rhythm.     Heart sounds: Normal heart sounds. No murmur.  Pulmonary:     Effort: Pulmonary effort is normal. No respiratory distress.     Breath sounds: Normal breath sounds. No wheezing.  Abdominal:     General: Bowel sounds are normal. There is no distension.     Palpations: Abdomen is soft.      Tenderness: There is no abdominal tenderness.  Musculoskeletal: Normal range of motion.        General: No tenderness.  Skin:    General: Skin is warm and dry.  Neurological:     Mental Status: She is alert and oriented to person, place, and time.     Cranial Nerves: No cranial nerve deficit.     Deep Tendon Reflexes: Reflexes are normal and symmetric.  Psychiatric:        Behavior: Behavior normal.        Thought Content: Thought content normal.        Judgment: Judgment normal.       BP 113/64   Pulse 67   Temp (!) 96 F (35.6 C) (Temporal)   Resp 16   Ht 5\' 2"  (1.575 m)   Wt 187 lb (84.8 kg)   SpO2 96%   BMI 34.20 kg/m      Assessment & Plan:  Rhonda Garcia comes in today with chief complaint of protime recheck   Diagnosis and orders addressed:  1. Paroxysmal atrial fibrillation (HCC) Description   INR today 3.0 (goal is 2-3)   (Perfect!)  Continue current dose of 2.5 mg (1/2 tablet) on Monday, Wednesday,  Friday, and Saturday. Then  5 mg all other days.       -  CoaguChek XS/INR Waived - Ambulatory referral to Cardiology  2. Long term current use of anticoagulant therapy - CoaguChek XS/INR Waived - Ambulatory referral to Cardiology  3. Coronary artery disease involving native coronary artery of native heart with unstable angina pectoris [facility]) - Ambulatory referral to Cardiology  4. History of atrial fibrillation - Ambulatory referral to Cardiology  5. Morbid obesity ([place]) - Ambulatory referral to Cardiology  6. Hx of non-ST elevation myocardial infarction (NSTEMI) - Ambulatory referral to Cardiology  7. Essential hypertension -Given Dizziness will decrease Zestoretic to 1 tab from 2 tabs - Ambulatory referral to Cardiology - lisinopril-hydrochlorothiazide (ZESTORETIC) 20-12.5 MG tablet; Take 2 tablets by mouth daily.  Dispense: 180 tablet; Refill: 6  8. Hypertension associated with diabetes ([place]) - Ambulatory referral to Cardiology -  lisinopril-hydrochlorothiazide (ZESTORETIC) 20-12.5 MG tablet; Take 1 tablets by mouth daily.  Dispense: 90 tablet; Refill: 6  9. Dizziness - Ambulatory referral to Cardiology   Labs pending Health Maintenance reviewed Diet and exercise encouraged  Follow up plan: 1 month   Evelina Dun, FNP

## 2018-12-19 NOTE — Patient Instructions (Signed)
Dizziness Dizziness is a common problem. It is a feeling of unsteadiness or light-headedness. You may feel like you are about to faint. Dizziness can lead to injury if you stumble or fall. Anyone can become dizzy, but dizziness is more common in older adults. This condition can be caused by a number of things, including medicines, dehydration, or illness. Follow these instructions at home: Eating and drinking  Drink enough fluid to keep your urine clear or pale yellow. This helps to keep you from becoming dehydrated. Try to drink more clear fluids, such as water.  Do not drink alcohol.  Limit your caffeine intake if told to do so by your health care provider. Check ingredients and nutrition facts to see if a food or beverage contains caffeine.  Limit your salt (sodium) intake if told to do so by your health care provider. Check ingredients and nutrition facts to see if a food or beverage contains sodium. Activity  Avoid making quick movements. ? Rise slowly from chairs and steady yourself until you feel okay. ? In the morning, first sit up on the side of the bed. When you feel okay, stand slowly while you hold onto something until you know that your balance is fine.  If you need to stand in one place for a long time, move your legs often. Tighten and relax the muscles in your legs while you are standing.  Do not drive or use heavy machinery if you feel dizzy.  Avoid bending down if you feel dizzy. Place items in your home so that they are easy for you to reach without leaning over. Lifestyle  Do not use any products that contain nicotine or tobacco, such as cigarettes and e-cigarettes. If you need help quitting, ask your health care provider.  Try to reduce your stress level by using methods such as yoga or meditation. Talk with your health care provider if you need help to manage your stress. General instructions  Watch your dizziness for any changes.  Take over-the-counter and  prescription medicines only as told by your health care provider. Talk with your health care provider if you think that your dizziness is caused by a medicine that you are taking.  Tell a friend or a family member that you are feeling dizzy. If he or she notices any changes in your behavior, have this person call your health care provider.  Keep all follow-up visits as told by your health care provider. This is important. Contact a health care provider if:  Your dizziness does not go away.  Your dizziness or light-headedness gets worse.  You feel nauseous.  You have reduced hearing.  You have new symptoms.  You are unsteady on your feet or you feel like the room is spinning. Get help right away if:  You vomit or have diarrhea and are unable to eat or drink anything.  You have problems talking, walking, swallowing, or using your arms, hands, or legs.  You feel generally weak.  You are not thinking clearly or you have trouble forming sentences. It may take a friend or family member to notice this.  You have chest pain, abdominal pain, shortness of breath, or sweating.  Your vision changes.  You have any bleeding.  You have a severe headache.  You have neck pain or a stiff neck.  You have a fever. These symptoms may represent a serious problem that is an emergency. Do not wait to see if the symptoms will go away. Get medical help   right away. Call your local emergency services (911 in the [place] Do not drive yourself to the hospital. Summary  Dizziness is a feeling of unsteadiness or light-headedness. This condition can be caused by a number of things, including medicines, dehydration, or illness.  Anyone can become dizzy, but dizziness is more common in older adults.  Drink enough fluid to keep your urine clear or pale yellow. Do not drink alcohol.  Avoid making quick movements if you feel dizzy. Monitor your dizziness for any changes. This information is not intended to  replace advice given to you by your health care provider. Make sure you discuss any questions you have with your health care provider. Document Released: 09/19/2000 Document Revised: 03/29/2017 Document Reviewed: 04/28/2016 Elsevier Patient Education  2020 [employer]  

## 2018-12-23 ENCOUNTER — Other Ambulatory Visit: Payer: Self-pay | Admitting: Family

## 2018-12-23 DIAGNOSIS — F411 Generalized anxiety disorder: Secondary | ICD-10-CM

## 2018-12-23 NOTE — Telephone Encounter (Signed)
This was already sent to [facility] this aM for approval.

## 2019-01-01 ENCOUNTER — Ambulatory Visit (INDEPENDENT_AMBULATORY_CARE_PROVIDER_SITE_OTHER): Payer: Medicare Other | Admitting: Nurse Practitioner

## 2019-01-01 ENCOUNTER — Encounter: Payer: Self-pay | Admitting: Nurse Practitioner

## 2019-01-01 ENCOUNTER — Other Ambulatory Visit: Payer: Self-pay

## 2019-01-01 VITALS — BP 108/64 | HR 73 | Temp 96.6°F | Ht 62.0 in | Wt 190.2 lb

## 2019-01-01 DIAGNOSIS — R1030 Lower abdominal pain, unspecified: Secondary | ICD-10-CM

## 2019-01-01 DIAGNOSIS — R109 Unspecified abdominal pain: Secondary | ICD-10-CM | POA: Insufficient documentation

## 2019-01-01 DIAGNOSIS — K5900 Constipation, unspecified: Secondary | ICD-10-CM | POA: Diagnosis not present

## 2019-01-01 NOTE — Assessment & Plan Note (Signed)
Persistent constipation.  She is on Linzess 145 mcg.  She only takes this "as needed".  And states she has to take something to have a bowel movement.  She has been having worsening abdominal discomfort over the past month although seems to be resolving.  I recommended she take Linzess once a day, every day on empty stomach.  Call us in 1 to 2 weeks and let us know how she is doing.  If needed we can reduce her dose to 72 mcg.  However, we extensively discussed the idea that [place] works most effectively on a regular basis.  Follow-up in 4 months.

## 2019-01-01 NOTE — Assessment & Plan Note (Signed)
Describes worsening abdominal pain over the past month associated with chronic constipation with straining and difficulty having stools on inconsistent Linzess.  This is been ongoing for the past month, although it seems to be getting better.  I feel her abdominal pain is likely related to her constipation.  Further constipation measures as per below.  Follow-up in 4 months.  Call for any worsening or severe symptoms.

## 2019-01-01 NOTE — Progress Notes (Signed)
Referring Provider: Sharion Balloon, FNP Primary Care Physician:  Sharion Balloon, FNP Primary GI:  Dr. Gala Romney  Chief Complaint  Patient presents with  . Constipation    HPI:   Rhonda Garcia is a 78 y.o. female who presents for follow-up on anemia.  The patient was last seen in our office 07/22/2018 for constipation, anemia, history of adenomatous colon polyp.  Her last visit was a virtual office visit due to COVID-19/coronavirus pandemic.  Known history of diverticulosis and internal hemorrhoids.  EGD on file 2017 with grade a esophagitis status post dilation.  Historically constipation has been controlled on Linzess.  History of anemia and decline in hemoglobin from 12-10.5.  CT without acute findings.  On chronic anticoagulation for A. fib with warfarin.  Updated labs 05/08/2018 with normal iron, ferritin, normalized hemoglobin at 11.3.  More follow-up labs 07/07/2018 found stable hemoglobin at 11.7.  At her last visit she stated she initially changed her dose of Linzess to 72 and it worked but was taking longer and requested to go back to 145 mcg dosing.  Having a bowel movement every couple days with lower abdominal discomfort that does improve after a bowel movement.  Intermittent nausea with poor appetite and constipation which generally starts with worsening constipation.  Denies hematochezia.  Some dyspnea on exertion for the past month and has been evaluated by primary care.  No other GI complaints.  Recommended increase Linzess 145 mcg daily, MiraLAX 1-2 times a day as needed, repeat colonoscopy because she was due for it at that time, follow-up in 4 months.  Colonoscopy completed 10/02/2018 which found diverticulosis in the sigmoid colon and descending colon, two 4 to 5 mm polyps in the ascending colon.  Surgical pathology found the polyps to be tubular adenoma.  Recommended no future colonoscopy unless new symptoms develop, due to age.  Today she states she's doing ok overall. Linzess  has stopped working for a bit. Had a month of significant abdominal pain and decreased appetite; this is still going on. States she takes Linzess as needed, otherwise she will stay in the bathroom every day. Is thinking of starting hot prune juice. When she has a bowel movement it is consistent with [place] 5-6, but still with straining. Has intermittent nausea if she is constipated. Denies vomiting, hematochezia, melena. Some indigestion at night-time. She is on Protonix bid. This has gotten better recently. Denies URI or flu-like symptoms. Denies loss of sense of taste or smell. Denies chest pain, dyspnea, dizziness, lightheadedness, syncope, near syncope. Denies any other upper or lower GI symptoms.  Past Medical History:  Diagnosis Date  . A-fib ([place])   . Anal fissure    resolved   . Anemia   . Back pain    with left radiculopathy  . CAD (coronary artery disease)   . Cataract   . Collagen vascular disease ([place])   . DDD (degenerative disc disease)   . Depression   . Diabetes mellitus   . Diverticulosis   . Dyslipidemia   . Dysrhythmia    AFib  . Esophagitis   . GERD (gastroesophageal reflux disease)   . Hiatal hernia   . Hx of peptic ulcer   . Hyperlipidemia   . Hypertension   . Hypothyroidism   . Internal hemorrhoids 2017  . NSTEMI (non-ST elevated myocardial infarction) ([place]) 08/05/2015  . Osteopenia   . Sleep apnea    not using CPAP now, could not tolerate and PCP aware.  Marland Kitchen  Thyroid disease     Past Surgical History:  Procedure Laterality Date  . ABDOMINAL HYSTERECTOMY Bilateral 2001 - approximate  . APPENDECTOMY    . BREAST REDUCTION SURGERY    . CARDIAC CATHETERIZATION N/A 08/08/2015   Procedure: Left Heart Cath and Coronary Angiography;  Surgeon: Burnell Blanks, MD;  Location: [facility];  Service: Cardiovascular;  Laterality: N/A;  . CHOLECYSTECTOMY  2008 - approximate  . COLONOSCOPY  06/2007   Friable anal canal and anal papillae. Pancolonic  diverticula. Normal terminal ileum. Status post segmental biopsy, descending colon biopsy showed minimal cryptitis. Biopsies felt to be  nonspecific but could be seen with NSAIDs. No features of inflammatory bowel disease. Stool studies were negative.  . COLONOSCOPY  03/21/2011   RMR: colonic polyps treated as described above, colonic diverticulosis (Tubular adenomas)  . COLONOSCOPY WITH PROPOFOL N/A 06/17/2014   RMR: Colonic diverticulosis. Multiple colonic polyps removed as described above.   . COLONOSCOPY WITH PROPOFOL N/A 03/19/2016   Procedure: COLONOSCOPY WITH PROPOFOL;  Surgeon: Daneil Dolin, MD;  [facility];  Service: Endoscopy;  Laterality: N/A;  8:45am  . COLONOSCOPY WITH PROPOFOL N/A 10/02/2018   Procedure: COLONOSCOPY WITH PROPOFOL;  Surgeon: Daneil Dolin, MD;  [facility];  Service: Endoscopy;  Laterality: N/A;  9:15am  . CORONARY ANGIOPLASTY WITH STENT PLACEMENT     4 stents  . ESOPHAGEAL DILATION N/A 06/17/2014   Procedure: ESOPHAGEAL DILATION;  Surgeon: Daneil Dolin, MD;  Location: [facility];  Service: Endoscopy;  Laterality: N/A;  Maloney 18 Fr  . ESOPHAGOGASTRODUODENOSCOPY  06/2007   Small hiatal hernia  . ESOPHAGOGASTRODUODENOSCOPY  03/21/2011   RMR: normal esophagus- status post passage of Maloney dilator. Small hiatal hernia. Trivial antral and bulbar erosions  . ESOPHAGOGASTRODUODENOSCOPY (EGD) WITH PROPOFOL N/A 06/17/2014   RMR: Small hiatal hernia; otherwise normal EGD. Status post passage of a Maloney dilator. No explanation for patients right upper quadrant abdominal pain.   Marland Kitchen ESOPHAGOGASTRODUODENOSCOPY (EGD) WITH PROPOFOL N/A 03/19/2016   Procedure: ESOPHAGOGASTRODUODENOSCOPY (EGD) WITH PROPOFOL;  Surgeon: Daneil Dolin, MD;  [facility];  Service: Endoscopy;  Laterality: N/A;  . KNEE SURGERY Left    arthroscopy  . LIPOMA EXCISION  03/17/2012   Procedure: EXCISION LIPOMA;  Surgeon: Gwenyth Ober, MD;  Location: [facility];  Service: General;  Laterality: Right;  excision of right lower back lipoma  . MALONEY DILATION N/A 03/19/2016   Procedure: Venia Minks DILATION;  Surgeon: Daneil Dolin, MD;  [facility];  Service: Endoscopy;  Laterality: N/A;  . POLYPECTOMY  06/17/2014   Procedure: POLYPECTOMY;  Surgeon: Daneil Dolin, MD;  Location: [facility];  Service: Endoscopy;;  . POLYPECTOMY  10/02/2018   Procedure: POLYPECTOMY;  Surgeon: Daneil Dolin, MD;  [facility];  Service: Endoscopy;;  colon    Current Outpatient Medications  Medication Sig Dispense Refill  . acetaminophen (TYLENOL) 325 MG tablet Take 325 mg by mouth every 6 (six) hours as needed for moderate pain.    Marland Kitchen ALPRAZolam (XANAX) 0.5 MG tablet TAKE ONE TABLET AT BEDTIME AS NEEDED 30 tablet 4  . amLODipine (NORVASC) 5 MG tablet Take 1 tablet (5 mg total) by mouth daily. 90 tablet 3  . escitalopram (LEXAPRO) 10 MG tablet Take 1 tablet (10 mg total) by mouth daily. 90 tablet 3  . HYDROcodone-acetaminophen (NORCO) 5-325 MG tablet Take 1 tablet by mouth every 4 (four) hours as needed for moderate  pain or severe pain. 30 tablet 0  . isosorbide mononitrate (IMDUR) 60 MG 24 hr tablet TAKE ONE (1) TABLET EACH DAY (Patient taking differently: Take 60 mg by mouth daily. ** DO NOT CRUSH **) 90 tablet 3  . levothyroxine (SYNTHROID) 50 MCG tablet TAKE ONE (1) TABLET EACH DAY 90 tablet 3  . linaclotide (LINZESS) 145 MCG CAPS capsule Take 1 capsule (145 mcg total) by mouth daily before breakfast. 30 capsule 5  . lisinopril-hydrochlorothiazide (ZESTORETIC) 20-12.5 MG tablet Take 1 tablet by mouth daily. 90 tablet 1  . meclizine (ANTIVERT) 25 MG tablet Take 1 tablet (25 mg total) by mouth 3 (three) times daily as needed for dizziness. 30 tablet 0  . metFORMIN (GLUCOPHAGE) 1000 MG tablet TAKE ONE TABLET TWICE A DAY WITH FOOD 180 tablet 2  . metoprolol tartrate (LOPRESSOR) 50 MG tablet TAKE ONE TABLET BY MOUTH TWICE DAILY 180 tablet 0  .  nitroGLYCERIN (NITROSTAT) 0.4 MG SL tablet Place 1 tablet (0.4 mg total) under the tongue every 5 (five) minutes as needed for chest pain. 20 tablet 1  . pantoprazole (PROTONIX) 40 MG tablet Take 1 tablet (40 mg total) by mouth 2 (two) times daily. 180 tablet 3  . pravastatin (PRAVACHOL) 80 MG tablet Take 1 tablet (80 mg total) by mouth every evening. NEED OV. 90 tablet 2  . sitaGLIPtin (JANUVIA) 100 MG tablet Take 1 tablet (100 mg total) by mouth daily. (Please make 3 mos June appt) 90 tablet 0  . Vitamin D, Ergocalciferol, (DRISDOL) 1.25 MG (50000 UT) CAPS capsule TAKE 1 CAPSULE EVERY 7 DAYS (Patient taking differently: Take 50,000 Units by mouth. TAKE 1 CAPSULE EVERY 7 DAYS) 12 capsule 0  . warfarin (COUMADIN) 5 MG tablet TAKE 1 TO 1&1/2 TABLET DAILY AS DIRECTED (Patient taking differently: Take by mouth daily. Take 2.5 mg every Monday, Wed, Friday, then take 5 mg daily on Sun, Tues, Thurs, Sat, Sun.) 135 tablet 1   No current facility-administered medications for this visit.     Allergies as of 01/01/2019 - Review Complete 01/01/2019  Allergen Reaction Noted  . Promethazine hcl Anaphylaxis 09/06/2010    Family History  Problem Relation Age of Onset  . Heart attack Mother   . Hypertension Mother   . Stroke Mother        Deceased, age 38  . Diabetes Sister   . Diabetes Sister   . Cancer Sister        ovarian  . Stroke Sister   . Diabetes Brother        also has a blood disorder   . Clotting disorder Brother   . Kidney disease Brother   . Heart disease Brother   . Melanoma Brother        deceased  . Coronary artery disease Other   . Kidney disease Other   . Diabetes Son   . Hypertension Son   . Coronary artery disease Son 23       CABG  . Hypertension Son   . Coronary artery disease Son 4       CABG  . Colon cancer Neg Hx     Social History   Socioeconomic History  . Marital status: Married    Spouse name: Not on file  . Number of children: 4  . Years of  education: 6  . Highest education level: 6th grade  Occupational History  . Occupation: Retired    Comment: Charity fundraiser  Social Needs  . Financial resource strain:  Somewhat hard  . Food insecurity    Worry: Never true    Inability: Never true  . Transportation needs    Medical: No    Non-medical: No  Tobacco Use  . Smoking status: Never Smoker  . Smokeless tobacco: Never Used  Substance and Sexual Activity  . Alcohol use: Not Currently    Alcohol/week: 1.0 standard drinks    Types: 1 Glasses of wine per week  . Drug use: No  . Sexual activity: Yes    Birth control/protection: Post-menopausal  Lifestyle  . Physical activity    Days per week: 0 days    Minutes per session: 0 min  . Stress: Only a little  Relationships  . Social connections    Talks on phone: More than three times a week    Gets together: More than three times a week    Attends religious service: More than 4 times per year    Active member of club or organization: Yes    Attends meetings of clubs or organizations: More than 4 times per year    Relationship status: Married  Other Topics Concern  . Not on file  Social History Narrative   Widowed but is remarrying in July 2019. Has 4 adult children. Lives in a house with fiance.     Review of Systems: General: Negative for anorexia, weight loss, fever, chills, fatigue, weakness. ENT: Negative for hoarseness, difficulty swallowing. CV: Negative for chest pain, angina, palpitations, peripheral edema.  Respiratory: Negative for dyspnea at rest, cough, sputum, wheezing.  GI: See history of present illness.  Endo: Negative for unusual weight change.  Heme: Negative for bruising or bleeding. Allergy: Negative for rash or hives.   Physical Exam: BP 108/64   Pulse 73   Temp (!) 96.6 F (35.9 C) (Temporal)   Ht 5\' 2"  (1.575 m)   Wt 190 lb 3.2 oz (86.3 kg)   BMI 34.79 kg/m  General:   Alert and oriented. Pleasant and cooperative. Well-nourished and  well-developed.  Eyes:  Without icterus, sclera clear and conjunctiva pink.  Ears:  Normal auditory acuity. Cardiovascular:  S1, S2 present without murmurs appreciated. Extremities without clubbing or edema. Respiratory:  Clear to auscultation bilaterally. No wheezes, rales, or rhonchi. No distress.  Gastrointestinal:  +BS, soft, non-tender and non-distended. No HSM noted. No guarding or rebound. No masses appreciated.  Rectal:  Deferred  Musculoskalatal:  Symmetrical without gross deformities. Neurologic:  Alert and oriented x4;  grossly normal neurologically. Psych:  Alert and cooperative. Normal mood and affect. Heme/Lymph/Immune: No excessive bruising noted.    01/01/2019 11:03 AM   Disclaimer: This note was dictated with voice recognition software. Similar sounding words can inadvertently be transcribed and may not be corrected upon review.

## 2019-01-01 NOTE — Patient Instructions (Signed)
Your health issues we discussed today were:   Constipation and abdominal pain: 1. As we discussed, take Linzess once a day, every day on an empty stomach. 2. Call us in 1 to 2 weeks and let us know how you are doing 3. You may have some diarrhea initially but this should resolve on its own.  Call us if it lasts more than a week or is severe 4. We may need to reduce your dose of Linzess if you have a lot of diarrhea 5. I feel your abdominal pain will likely improve when your constipation is better  Overall I recommend:  1. Continue your other current medications 2. Call us if you have any questions or concerns 3. Follow-up in 4 months.   Because of recent events of COVID-19 ("Coronavirus"), follow CDC recommendations:  1. Wash your hand frequently 2. Avoid touching your face 3. Stay away from people who are sick 4. If you have symptoms such as fever, cough, shortness of breath then call your healthcare provider for further guidance 5. If you are sick, STAY AT HOME unless otherwise directed by your healthcare provider. 6. Follow directions from state and national officials regarding staying safe   At [facility] we value your feedback. You may receive a survey about your visit today. Please share your experience as we strive to create trusting relationships with our patients to provide genuine, compassionate, quality care.  We appreciate your understanding and patience as we review any laboratory studies, imaging, and other diagnostic tests that are ordered as we care for you. Our office policy is 5 business days for review of these results, and any emergent or urgent results are addressed in a timely manner for your best interest. If you do not hear from our office in 1 week, please contact us.   We also encourage the use of MyChart, which contains your medical information for your review as well. If you are not enrolled in this feature, an access code is on this after  visit summary for your convenience. Thank you for allowing [place] to be involved in your care.  It was great to see you today!  I hope you have a great Fall!!

## 2019-01-16 ENCOUNTER — Other Ambulatory Visit: Payer: Self-pay

## 2019-01-19 ENCOUNTER — Encounter: Payer: Self-pay | Admitting: Family

## 2019-01-19 ENCOUNTER — Ambulatory Visit (INDEPENDENT_AMBULATORY_CARE_PROVIDER_SITE_OTHER): Payer: Medicare Other | Admitting: Family

## 2019-01-19 VITALS — BP 122/65 | HR 60 | Temp 96.3°F | Ht 62.0 in | Wt 191.6 lb

## 2019-01-19 DIAGNOSIS — Z7901 Long term (current) use of anticoagulants: Secondary | ICD-10-CM

## 2019-01-19 DIAGNOSIS — E1159 Type 2 diabetes mellitus with other circulatory complications: Secondary | ICD-10-CM

## 2019-01-19 DIAGNOSIS — I10 Essential (primary) hypertension: Secondary | ICD-10-CM | POA: Diagnosis not present

## 2019-01-19 DIAGNOSIS — M1712 Unilateral primary osteoarthritis, left knee: Secondary | ICD-10-CM | POA: Diagnosis not present

## 2019-01-19 DIAGNOSIS — I480 Paroxysmal atrial fibrillation: Secondary | ICD-10-CM | POA: Diagnosis not present

## 2019-01-19 DIAGNOSIS — I152 Hypertension secondary to endocrine disorders: Secondary | ICD-10-CM

## 2019-01-19 DIAGNOSIS — F411 Generalized anxiety disorder: Secondary | ICD-10-CM

## 2019-01-19 LAB — COAGUCHEK XS/INR WAIVED
INR: 3.7 — ABNORMAL HIGH (ref 0.9–1.1)
Prothrombin Time: 44.9 s

## 2019-01-19 MED ORDER — HYDROCODONE-ACETAMINOPHEN 5-325 MG PO TABS: 1.0000 | ORAL_TABLET | ORAL | 0 refills | Status: DC | PRN

## 2019-01-19 MED ORDER — ALPRAZOLAM 0.5 MG PO TABS: 0.5000 mg | ORAL_TABLET | Freq: Every evening | ORAL | 4 refills | Status: DC | PRN

## 2019-01-19 NOTE — Addendum Note (Signed)
Addended by: Evelina Dun A on: 01/19/2019 12:46 PM   Modules accepted: Orders

## 2019-01-19 NOTE — Patient Instructions (Signed)
Description   INR today 3.7 (goal is 2-3)   (Perfect!)  Hold today's dose (01/19/19) then we decrease his  dose of 2.5 mg (1/2 tablet) every day except on Thursday you will take  5 mg.Marland Kitchen

## 2019-01-19 NOTE — Progress Notes (Addendum)
Subjective:    Patient ID: Rhonda Garcia, female    DOB: Aug 27, 1940, 78 y.o.   MRN: [id number]  Chief Complaint  Patient presents with  . protime recheck   Pt presents to the office today to recheck INR. She is currently taking warfarin for A Fib. She was seen on 12/19/18 with an INR of 3.0. No missed doses, extra doses, changes in medications, or diet change. See anticoagulation flow sheet.   Her BP is stable today. The last few times we have seen her her BP has been on the lower side. We stopped her stopped Spirolactone and decreased her Zestoretic 20-12.5 mg to to 1 tablet from 2 tabs. She reports her dizziness is gone and feeling much better.  Arthritis Presents for follow-up visit. She complains of pain and stiffness. The symptoms have been stable. Affected locations include the right knee, left knee, right MCP and left MCP. Her pain is at a severity of 7/10.  Anxiety Presents for follow-up visit. Symptoms include depressed mood, excessive worry, irritability, nervous/anxious behavior and restlessness. Symptoms occur constantly. The severity of symptoms is moderate. The quality of sleep is good.        Review of Systems  Constitutional: Positive for irritability.  Musculoskeletal: Positive for arthritis and stiffness.  Psychiatric/Behavioral: The patient is nervous/anxious.   All other systems reviewed and are negative.      Objective:   Physical Exam Vitals signs reviewed.  Constitutional:      General: She is not in acute distress.    Appearance: She is well-developed. She is obese.  HENT:     Head: Normocephalic and atraumatic.  Eyes:     Pupils: Pupils are equal, round, and reactive to light.  Neck:     Musculoskeletal: Normal range of motion and neck supple.     Thyroid: No thyromegaly.  Cardiovascular:     Rate and Rhythm: Normal rate and regular rhythm.     Heart sounds: Normal heart sounds. No murmur.  Pulmonary:     Effort: Pulmonary effort is normal. No  respiratory distress.     Breath sounds: Normal breath sounds. No wheezing.  Abdominal:     General: Bowel sounds are normal. There is no distension.     Palpations: Abdomen is soft.     Tenderness: There is no abdominal tenderness.  Musculoskeletal: Normal range of motion.        General: No tenderness.  Skin:    General: Skin is warm and dry.  Neurological:     Mental Status: She is alert and oriented to person, place, and time.     Cranial Nerves: No cranial nerve deficit.     Deep Tendon Reflexes: Reflexes are normal and symmetric.  Psychiatric:        Behavior: Behavior normal.        Thought Content: Thought content normal.        Judgment: Judgment normal.    BP 122/65   Pulse 60   Temp (!) 96.3 F (35.7 C) (Temporal)   Ht 5\' 2"  (1.575 m)   Wt 191 lb 9.6 oz (86.9 kg)   SpO2 96%   BMI 35.04 kg/m      Assessment & Plan:  Rhonda Garcia comes in today with chief complaint of protime recheck   Diagnosis and orders addressed:  1. Paroxysmal atrial fibrillation (H[facility]) Description   INR today 3.7 (goal is 2-3)   (Perfect!)  Hold today's dose (01/19/19) then we decrease his  dose of 2.5 mg (1/2 tablet) every day except on Thursday you will take  5 mg..     RTO in 2 weeks   - CoaguChek XS/INR Waived  2. Long term current use of anticoagulant therapy - CoaguChek XS/INR Waived  3. Hypertension associated with diabetes ([place]) Improved Continue current medications   4. Primary osteoarthritis of left knee - HYDROcodone-acetaminophen (NORCO) 5-325 MG tablet; Take 1 tablet by mouth every 4 (four) hours as needed for moderate pain or severe pain.  Dispense: 30 tablet; Refill: 0  5. GAD (generalized anxiety disorder) - ALPRAZolam (XANAX) 0.5 MG tablet; Take 1 tablet (0.5 mg total) by mouth at bedtime as needed.  Dispense: 30 tablet; Refill: 4 Pt reviewed in [place] controlled database- No redflags noted  Evelina Dun, FNP

## 2019-01-20 DIAGNOSIS — Z1231 Encounter for screening mammogram for malignant neoplasm of breast: Secondary | ICD-10-CM | POA: Diagnosis not present

## 2019-01-20 NOTE — Progress Notes (Signed)
HPI The patient presents for evaluation of known coronary disease and atrial fibrillation.   Cath for chest pain in 2017 demonstrated nonobstructive disease in large vessels with an occluded PL off the RCA.  She had patent stents in the RI, RCA and LAD.  She was managed medically.  Since I last saw her she was at [facility] ED with chest pain.  I reviewed these records for this visit.   She ruled out for MI and she had a negative Lexiscan Myoview.       She returns for follow up.   She says she is under a lot of stress because her husband has COPD.  She has not had any further chest discomfort, neck or arm discomfort.  She had no palpitations, presyncope or syncope.  She walks to her mailbox and she gets short of breath but she recovers quickly.  She says this has been chronic.  She has not required any nitroglycerin.  She is having trouble regulating her Coumadin and would like to switch to something else.   Allergies  Allergen Reactions  . Promethazine Hcl Anaphylaxis    Current Outpatient Medications  Medication Sig Dispense Refill  . acetaminophen (TYLENOL) 325 MG tablet Take 325 mg by mouth every 6 (six) hours as needed for moderate pain.    Marland Kitchen ALPRAZolam (XANAX) 0.5 MG tablet Take 1 tablet (0.5 mg total) by mouth at bedtime as needed. 30 tablet 4  . amLODipine (NORVASC) 5 MG tablet Take 1 tablet (5 mg total) by mouth daily. 90 tablet 3  . escitalopram (LEXAPRO) 10 MG tablet Take 1 tablet (10 mg total) by mouth daily. 90 tablet 3  . HYDROcodone-acetaminophen (NORCO) 5-325 MG tablet Take 1 tablet by mouth every 4 (four) hours as needed for moderate pain or severe pain. 30 tablet 0  . isosorbide mononitrate (IMDUR) 60 MG 24 hr tablet TAKE ONE (1) TABLET EACH DAY (Patient taking differently: Take 60 mg by mouth daily. ** DO NOT CRUSH **) 90 tablet 3  . levothyroxine (SYNTHROID) 50 MCG tablet TAKE ONE (1) TABLET EACH DAY 90 tablet 3  . linaclotide (LINZESS) 145 MCG CAPS capsule Take 1 capsule  (145 mcg total) by mouth daily before breakfast. 30 capsule 5  . lisinopril-hydrochlorothiazide (ZESTORETIC) 20-12.5 MG tablet Take 1 tablet by mouth daily. 90 tablet 1  . meclizine (ANTIVERT) 25 MG tablet Take 1 tablet (25 mg total) by mouth 3 (three) times daily as needed for dizziness. 30 tablet 0  . metFORMIN (GLUCOPHAGE) 1000 MG tablet TAKE ONE TABLET TWICE A DAY WITH FOOD 180 tablet 2  . metoprolol tartrate (LOPRESSOR) 50 MG tablet TAKE ONE TABLET BY MOUTH TWICE DAILY 180 tablet 0  . nitroGLYCERIN (NITROSTAT) 0.4 MG SL tablet Place 1 tablet (0.4 mg total) under the tongue every 5 (five) minutes as needed for chest pain. 20 tablet 1  . pantoprazole (PROTONIX) 40 MG tablet Take 1 tablet (40 mg total) by mouth 2 (two) times daily. 180 tablet 3  . pravastatin (PRAVACHOL) 80 MG tablet Take 1 tablet (80 mg total) by mouth every evening. NEED OV. 90 tablet 2  . sitaGLIPtin (JANUVIA) 100 MG tablet Take 1 tablet (100 mg total) by mouth daily. (Please make 3 mos June appt) 90 tablet 0  . Vitamin D, Ergocalciferol, (DRISDOL) 1.25 MG (50000 UT) CAPS capsule TAKE 1 CAPSULE EVERY 7 DAYS (Patient taking differently: Take 50,000 Units by mouth. TAKE 1 CAPSULE EVERY 7 DAYS) 12 capsule 0  . apixaban (  ELIQUIS) 5 MG TABS tablet Take 1 tablet (5 mg total) by mouth 2 (two) times daily. 60 tablet 11   No current facility-administered medications for this visit.     Past Medical History:  Diagnosis Date  . A-fib ([place])   . Anal fissure    resolved   . Anemia   . Back pain    with left radiculopathy  . CAD (coronary artery disease)   . Cataract   . Collagen vascular disease ([place])   . DDD (degenerative disc disease)   . Depression   . Diabetes mellitus   . Diverticulosis   . Dyslipidemia   . Dysrhythmia    AFib  . Esophagitis   . GERD (gastroesophageal reflux disease)   . Hiatal hernia   . Hx of peptic ulcer   . Hyperlipidemia   . Hypertension   . Hypothyroidism   . Internal hemorrhoids 2017  .  NSTEMI (non-ST elevated myocardial infarction) ([place]) 08/05/2015  . Osteopenia   . Sleep apnea    not using CPAP now, could not tolerate and PCP aware.  . Thyroid disease     Past Surgical History:  Procedure Laterality Date  . ABDOMINAL HYSTERECTOMY Bilateral 2001 - approximate  . APPENDECTOMY    . BREAST REDUCTION SURGERY    . CARDIAC CATHETERIZATION N/A 08/08/2015   Procedure: Left Heart Cath and Coronary Angiography;  Surgeon: Burnell Blanks, MD;  Location: [place] [facility];  Service: Cardiovascular;  Laterality: N/A;  . CHOLECYSTECTOMY  2008 - approximate  . COLONOSCOPY  06/2007   Friable anal canal and anal papillae. Pancolonic diverticula. Normal terminal ileum. Status post segmental biopsy, descending colon biopsy showed minimal cryptitis. Biopsies felt to be  nonspecific but could be seen with NSAIDs. No features of inflammatory bowel disease. Stool studies were negative.  . COLONOSCOPY  03/21/2011   RMR: colonic polyps treated as described above, colonic diverticulosis (Tubular adenomas)  . COLONOSCOPY WITH PROPOFOL N/A 06/17/2014   RMR: Colonic diverticulosis. Multiple colonic polyps removed as described above.   . COLONOSCOPY WITH PROPOFOL N/A 03/19/2016   Procedure: COLONOSCOPY WITH PROPOFOL;  Surgeon: Daneil Dolin, MD;  [facility];  Service: Endoscopy;  Laterality: N/A;  8:45am  . COLONOSCOPY WITH PROPOFOL N/A 10/02/2018   Procedure: COLONOSCOPY WITH PROPOFOL;  Surgeon: Daneil Dolin, MD;  [facility];  Service: Endoscopy;  Laterality: N/A;  9:15am  . CORONARY ANGIOPLASTY WITH STENT PLACEMENT     4 stents  . ESOPHAGEAL DILATION N/A 06/17/2014   Procedure: ESOPHAGEAL DILATION;  Surgeon: Daneil Dolin, MD;  Location: [facility];  Service: Endoscopy;  Laterality: N/A;  Maloney 45 Fr  . ESOPHAGOGASTRODUODENOSCOPY  06/2007   Small hiatal hernia  . ESOPHAGOGASTRODUODENOSCOPY  03/21/2011   RMR: normal esophagus- status post passage of Maloney dilator.  Small hiatal hernia. Trivial antral and bulbar erosions  . ESOPHAGOGASTRODUODENOSCOPY (EGD) WITH PROPOFOL N/A 06/17/2014   RMR: Small hiatal hernia; otherwise normal EGD. Status post passage of a Maloney dilator. No explanation for patients right upper quadrant abdominal pain.   Marland Kitchen ESOPHAGOGASTRODUODENOSCOPY (EGD) WITH PROPOFOL N/A 03/19/2016   Procedure: ESOPHAGOGASTRODUODENOSCOPY (EGD) WITH PROPOFOL;  Surgeon: Daneil Dolin, MD;  [facility];  Service: Endoscopy;  Laterality: N/A;  . KNEE SURGERY Left    arthroscopy  . LIPOMA EXCISION  03/17/2012   Procedure: EXCISION LIPOMA;  Surgeon: Gwenyth Ober, MD;  Location: [facility];  Service: General;  Laterality: Right;  excision of  right lower back lipoma  . MALONEY DILATION N/A 03/19/2016   Procedure: Venia Minks DILATION;  Surgeon: Daneil Dolin, MD;  [facility];  Service: Endoscopy;  Laterality: N/A;  . POLYPECTOMY  06/17/2014   Procedure: POLYPECTOMY;  Surgeon: Daneil Dolin, MD;  Location[facility];  Service: Endoscopy;;  . POLYPECTOMY  10/02/2018   Procedure: POLYPECTOMY;  Surgeon: Daneil Dolin, MD;  [facility];  Service: Endoscopy;;  colon    ROS:  As stated in the HPI and negative for all other systems.  PHYSICAL EXAM BP 112/60   Pulse (!) 56   Ht 5\' 2"  (1.575 m)   Wt 191 lb (86.6 kg)   BMI 34.93 kg/m  GENERAL:  Well appearing NECK:  No jugular venous distention, waveform within normal limits, carotid upstroke brisk and symmetric, no bruits, no thyromegaly LUNGS:  Clear to auscultation bilaterally HEART:  PMI not displaced or sustained,S1 and S2 within normal limits, no S3, no S4, no clicks, no rubs, no murmurs ABD:  Flat, positive bowel sounds normal in frequency in pitch, no bruits, no rebound, no guarding, no midline pulsatile mass, no hepatomegaly, no splenomegaly EXT:  2 plus pulses throughout, no edema, no cyanosis no clubbing SKIN:  No rashes no nodules    Lab Results   Component Value Date   CHOL 164 07/07/2018   TRIG 91 07/07/2018   HDL 37 (L) 07/07/2018   LDLCALC 109 (H) 07/07/2018    ASSESSMENT AND PLAN  ATRIAL FIB  Rhonda Garcia has a CHA2DS2 - VASc score of 5 with a risk of stroke of 6.7%.  She wants to switch to something else and offered Eliquis 5 mg twice daily.  She is already been off of her Coumadin this will be the third day so I think she can start the Eliquis tomorrow morning. .  CAD, NATIVE VESSEL  She had a negative Lexiscan Myoview in May.  She is had no new chest discomfort.  I reviewed these results.  No further testing.  HYPERTENSION, UNSPECIFIED The blood pressure is at target.  No change in therapy.   HYPERLIPIDEMIA LDL is slightly high.  She did not tolerate Lipitor.  She would agree to Crestor because of the nosebleed previously.  She agrees to get another lipid profile in 1 month and if she is not at target she will try Crestor again.   DM No change in therapy.   AIC was up to 7.5.  This is followed by her primary care provider.  She reports she is eating a lot of sweets and we talked about this.  I will defer to Sharion Balloon, FNP

## 2019-01-21 ENCOUNTER — Encounter: Payer: Self-pay | Admitting: Cardiology

## 2019-01-21 ENCOUNTER — Ambulatory Visit: Payer: Medicare Other | Admitting: Cardiology

## 2019-01-21 ENCOUNTER — Other Ambulatory Visit: Payer: Self-pay

## 2019-01-21 VITALS — BP 112/60 | HR 56 | Ht 62.0 in | Wt 191.0 lb

## 2019-01-21 DIAGNOSIS — E785 Hyperlipidemia, unspecified: Secondary | ICD-10-CM | POA: Diagnosis not present

## 2019-01-21 DIAGNOSIS — R0602 Shortness of breath: Secondary | ICD-10-CM

## 2019-01-21 DIAGNOSIS — I480 Paroxysmal atrial fibrillation: Secondary | ICD-10-CM

## 2019-01-21 DIAGNOSIS — I10 Essential (primary) hypertension: Secondary | ICD-10-CM

## 2019-01-21 DIAGNOSIS — E118 Type 2 diabetes mellitus with unspecified complications: Secondary | ICD-10-CM

## 2019-01-21 DIAGNOSIS — I2510 Atherosclerotic heart disease of native coronary artery without angina pectoris: Secondary | ICD-10-CM | POA: Diagnosis not present

## 2019-01-21 MED ORDER — APIXABAN 5 MG PO TABS: 5.0000 mg | ORAL_TABLET | Freq: Two times a day (BID) | ORAL | 11 refills | Status: DC

## 2019-01-21 NOTE — Patient Instructions (Signed)
Medication Instructions:  Please discontinue your Coumadin. Start Eliquis 5 mg one tablet by mouth twice a day. Continue all other medications as listed.  If you need a refill on your cardiac medications before your next appointment, please call your pharmacy.   Please have fasting Lipid panel in 1 month with [facility].  Follow-Up: Follow up in 6 months with Dr. Percival Spanish.  You will receive a letter in the mail 2 months before you are due.  Please call us when you receive this letter to schedule your follow up appointment.  Thank you for choosing [facility]!!

## 2019-01-23 ENCOUNTER — Other Ambulatory Visit: Payer: Self-pay

## 2019-01-23 DIAGNOSIS — R928 Other abnormal and inconclusive findings on diagnostic imaging of breast: Secondary | ICD-10-CM

## 2019-01-23 NOTE — Progress Notes (Signed)
Incomplete/abnormal screnning mammogram - additional imaging recommended orders placed.  [facility] will contact patient to make appt signed orders faxed to their loction. MPulliam, CMA/RT(R)

## 2019-02-02 ENCOUNTER — Encounter (INDEPENDENT_AMBULATORY_CARE_PROVIDER_SITE_OTHER): Payer: Self-pay

## 2019-02-05 ENCOUNTER — Ambulatory Visit (INDEPENDENT_AMBULATORY_CARE_PROVIDER_SITE_OTHER): Payer: Medicare Other | Admitting: Family

## 2019-02-05 ENCOUNTER — Other Ambulatory Visit: Payer: Self-pay

## 2019-02-05 ENCOUNTER — Encounter: Payer: Self-pay | Admitting: Family

## 2019-02-05 DIAGNOSIS — Z7901 Long term (current) use of anticoagulants: Secondary | ICD-10-CM

## 2019-02-05 DIAGNOSIS — I480 Paroxysmal atrial fibrillation: Secondary | ICD-10-CM

## 2019-02-05 LAB — COAGUCHEK XS/INR WAIVED
INR: 2.3 — ABNORMAL HIGH (ref 0.9–1.1)
Prothrombin Time: 28.1 s

## 2019-02-05 NOTE — Patient Instructions (Signed)
INR today 2.3 (goal is 2-3)   (Perfect!)  Continue dose of 2.5 mg (1/2 tablet) every day except on Thursday you will take  5 mg.

## 2019-02-05 NOTE — Addendum Note (Signed)
Addended by: Liliane Bade on: 02/05/2019 03:43 PM   Modules accepted: Orders

## 2019-02-05 NOTE — Progress Notes (Signed)
   Subjective:    Patient ID: Rhonda Garcia, female    DOB: 1940-09-15, 78 y.o.   MRN: [id number]  Chief Complaint  Patient presents with  . Protime recheck    HPI Pt presents to the office today to have INR rechecked. She states she saw her Cardiologists earlier in the week and was switched to Eliquis from warfarin for  A Fib. She states she went to pick up the rx and it was $117 for one month. She reports she can not afford this and did not pick up.    Her INR is 2.3. Denies any missed doses, changes in medications, changes in her diet, bleeding, or bruising. See anticoagulation flowsheet.    Review of Systems  All other systems reviewed and are negative.      Objective:   Physical Exam Vitals signs reviewed.  Constitutional:      General: She is not in acute distress.    Appearance: She is well-developed.  HENT:     Head: Normocephalic and atraumatic.     Right Ear: Tympanic membrane normal.     Left Ear: Tympanic membrane normal.  Eyes:     Pupils: Pupils are equal, round, and reactive to light.  Neck:     Musculoskeletal: Normal range of motion and neck supple.     Thyroid: No thyromegaly.  Cardiovascular:     Rate and Rhythm: Normal rate and regular rhythm.     Heart sounds: Normal heart sounds. No murmur.  Pulmonary:     Effort: Pulmonary effort is normal. No respiratory distress.     Breath sounds: Normal breath sounds. No wheezing.  Abdominal:     General: Bowel sounds are normal. There is no distension.     Palpations: Abdomen is soft.     Tenderness: There is no abdominal tenderness.  Musculoskeletal: Normal range of motion.        General: No tenderness.  Skin:    General: Skin is warm and dry.  Neurological:     Mental Status: She is alert and oriented to person, place, and time.     Cranial Nerves: No cranial nerve deficit.     Deep Tendon Reflexes: Reflexes are normal and symmetric.  Psychiatric:        Behavior: Behavior normal.        Thought  Content: Thought content normal.        Judgment: Judgment normal.     BP 133/67   Pulse 73   Temp (!) 96.5 F (35.8 C) (Temporal)   Ht 5\' 2"  (1.575 m)   Wt 190 lb (86.2 kg)   BMI 34.75 kg/m       Assessment & Plan:  Rhonda Garcia comes in today with chief complaint of Protime recheck   Diagnosis and orders addressed:  1. Paroxysmal atrial fibrillation (HCC)  2. Long term current use of anticoagulant therapy   Description   INR today 2.3 (goal is 2-3)   (Perfect!)  Continue dose of 2.5 mg (1/2 tablet) every day except on Thursday you will take  5 mg.      Will give Eliquis coupon to see if can get cheaper. Will stop Warfarin for 3 days prior to starting ELiquis.    Evelina Dun, FNP    Evelina Dun,

## 2019-03-12 ENCOUNTER — Ambulatory Visit (INDEPENDENT_AMBULATORY_CARE_PROVIDER_SITE_OTHER): Payer: Medicare Other | Admitting: Family

## 2019-03-12 ENCOUNTER — Ambulatory Visit: Payer: Medicare Other | Admitting: Family

## 2019-03-12 NOTE — Progress Notes (Signed)
   Virtual Visit via telephone Note Due to COVID-19 pandemic this visit was conducted virtually. This visit type was conducted due to national recommendations for restrictions regarding the COVID-19 Pandemic (e.g. social distancing, sheltering in place) in an effort to limit this patient's exposure and mitigate transmission in our community. All issues noted in this document were discussed and addressed.  A physical exam was not performed with this format.  Attempted to call patient at 2:20 pm, no answer. Phone line states it has been disconnected.    Evelina Dun, FNP

## 2019-03-17 ENCOUNTER — Encounter (HOSPITAL_COMMUNITY): Payer: Self-pay

## 2019-03-17 ENCOUNTER — Encounter (HOSPITAL_COMMUNITY): Payer: Medicare Other

## 2019-03-17 ENCOUNTER — Ambulatory Visit (HOSPITAL_COMMUNITY): Admission: RE | Admit: 2019-03-17 | Payer: Medicare Other | Source: Ambulatory Visit

## 2019-03-26 ENCOUNTER — Other Ambulatory Visit: Payer: Self-pay | Admitting: Family

## 2019-03-26 DIAGNOSIS — E1169 Type 2 diabetes mellitus with other specified complication: Secondary | ICD-10-CM

## 2019-04-06 ENCOUNTER — Telehealth: Payer: Self-pay | Admitting: Family

## 2019-04-08 ENCOUNTER — Telehealth: Payer: Self-pay

## 2019-04-08 NOTE — Telephone Encounter (Signed)
Patient has been called multi times by both [facility] and myself in regards to r/s additional imaging for breast.  Patient's number in chart is not working.  Patient has been sent a letter through [employer] and mail to contact our office or Forestine Na to r/s

## 2019-04-30 ENCOUNTER — Other Ambulatory Visit: Payer: Self-pay

## 2019-05-01 ENCOUNTER — Encounter: Payer: Self-pay | Admitting: Family

## 2019-05-01 ENCOUNTER — Other Ambulatory Visit: Payer: Self-pay

## 2019-05-01 ENCOUNTER — Ambulatory Visit (INDEPENDENT_AMBULATORY_CARE_PROVIDER_SITE_OTHER): Payer: Medicare Other | Admitting: Family

## 2019-05-01 VITALS — BP 98/50 | HR 71 | Temp 95.9°F | Ht 62.0 in | Wt 177.4 lb

## 2019-05-01 DIAGNOSIS — F411 Generalized anxiety disorder: Secondary | ICD-10-CM

## 2019-05-01 DIAGNOSIS — I480 Paroxysmal atrial fibrillation: Secondary | ICD-10-CM | POA: Diagnosis not present

## 2019-05-01 DIAGNOSIS — Z7901 Long term (current) use of anticoagulants: Secondary | ICD-10-CM

## 2019-05-01 DIAGNOSIS — R42 Dizziness and giddiness: Secondary | ICD-10-CM

## 2019-05-01 DIAGNOSIS — E1159 Type 2 diabetes mellitus with other circulatory complications: Secondary | ICD-10-CM | POA: Diagnosis not present

## 2019-05-01 DIAGNOSIS — I959 Hypotension, unspecified: Secondary | ICD-10-CM

## 2019-05-01 DIAGNOSIS — I152 Hypertension secondary to endocrine disorders: Secondary | ICD-10-CM

## 2019-05-01 DIAGNOSIS — I10 Essential (primary) hypertension: Secondary | ICD-10-CM

## 2019-05-01 LAB — COAGUCHEK XS/INR WAIVED
INR: 1.3 — ABNORMAL HIGH (ref 0.9–1.1)
Prothrombin Time: 15.5 s

## 2019-05-01 LAB — HEMOGLOBIN, FINGERSTICK: Hemoglobin: 13.2 g/dL (ref 11.1–15.9)

## 2019-05-01 MED ORDER — LISINOPRIL 10 MG PO TABS: 10.0000 mg | ORAL_TABLET | Freq: Every day | ORAL | 3 refills | Status: DC

## 2019-05-01 NOTE — Progress Notes (Signed)
Subjective:    Patient ID: Rhonda Garcia, female    DOB: 1940-05-22, 79 y.o.   MRN: [id number]  Chief Complaint  Patient presents with  . Dizziness   Pt presents to the office today for complaints of dizziness. Her BP is low today.   She also taking Eliquis for Afib . Is doing well with this.   She reports she has been very stressed, because her son had a stroke and has moved into her home. She reports her husband left her when this happened. She has lost a lot of weight because of the stress. She has lost 13 lbs since 10/29/200  Dizziness This is a new problem. The current episode started more than 1 month ago. The problem occurs intermittently. The problem has been waxing and waning. Associated symptoms include weakness. Pertinent negatives include no congestion, coughing, joint swelling or nausea.      Review of Systems  HENT: Negative for congestion.   Respiratory: Negative for cough.   Gastrointestinal: Negative for nausea.  Musculoskeletal: Negative for joint swelling.  Neurological: Positive for dizziness and weakness.  All other systems reviewed and are negative.      Objective:   Physical Exam Vitals reviewed.  Constitutional:      General: She is not in acute distress.    Appearance: She is well-developed.  HENT:     Head: Normocephalic and atraumatic.  Eyes:     Pupils: Pupils are equal, round, and reactive to light.  Neck:     Thyroid: No thyromegaly.  Cardiovascular:     Rate and Rhythm: Normal rate and regular rhythm.     Heart sounds: Normal heart sounds. No murmur.  Pulmonary:     Effort: Pulmonary effort is normal. No respiratory distress.     Breath sounds: Normal breath sounds. No wheezing.  Abdominal:     General: Bowel sounds are normal. There is no distension.     Palpations: Abdomen is soft.     Tenderness: There is no abdominal tenderness.  Musculoskeletal:        General: No tenderness. Normal range of motion.     Cervical back: Normal  range of motion and neck supple.  Skin:    General: Skin is warm and dry.  Neurological:     Mental Status: She is alert and oriented to person, place, and time.     Cranial Nerves: No cranial nerve deficit.     Deep Tendon Reflexes: Reflexes are normal and symmetric.  Psychiatric:        Behavior: Behavior normal.        Thought Content: Thought content normal.        Judgment: Judgment normal.       BP (!) 98/50   Pulse 71   Temp (!) 95.9 F (35.5 C) (Temporal)   Ht 5\' 2"  (1.575 m)   Wt 177 lb 6 oz (80.5 kg)   BMI 32.44 kg/m      Assessment & Plan:  VIOLAR WHINNERY comes in today with chief complaint of Dizziness   Diagnosis and orders addressed:  1. Long term current use of anticoagulant therapy Continue Eliquis  - CoaguChek XS/INR Waived  2. Dizziness I believe this is related to hypotension - Hemoglobin, fingerstick - Urinalysis, Complete - EKG 12-Lead  3. Hypertension associated with diabetes ([place]) Stop Norvasc 5 mg Will stop HCTZ and decrease Lisinopril to 10 mg from 20 mg - lisinopril (ZESTRIL) 10 MG tablet; Take 1 tablet (10  mg total) by mouth daily.  Dispense: 90 tablet; Refill: 3  4. Paroxysmal atrial fibrillation (HCC)   6. GAD (generalized anxiety disorder) Stress management discussed  7. Morbid obesity ([place])  8. Hypotension, unspecified hypotension type Stop Norvasc 5 mg Will stop HCTZ and decrease Lisinopril to 10 mg from 20 mg   Labs pending Health Maintenance reviewed Diet and exercise encouraged  Follow up plan: 1 week to recheck hypotension   'Evelina Dun, [place]

## 2019-05-01 NOTE — Patient Instructions (Signed)
Hypotension As your heart beats, it forces blood through your body. Hypotension, commonly called low blood pressure, is when the force of blood pumping through your arteries is too weak. Arteries are blood vessels that carry blood from the heart throughout the body. Depending on the cause and severity, hypotension may be harmless (benign) or may cause serious problems (be critical). When blood pressure is too low, you may not get enough blood to your brain or to the rest of your organs. This can cause weakness, light-headedness, rapid heartbeat, and fainting. What are the causes? This condition may be caused by:  Blood loss.  Loss of body fluids (dehydration).  Heart problems.  Hormone (endocrine) problems.  Pregnancy.  Severe infection.  Lack of certain nutrients.  Severe allergic reactions (anaphylaxis).  Certain medicines, such as blood pressure medicine or medicines that make the body lose excess fluids (diuretics). Sometimes, hypotension may be caused by not taking medicine as directed, such as taking too much of a certain medicine. What increases the risk? The following factors may make you more likely to develop this condition:  Age. Risk increases as you get older.  Conditions that affect the heart or the central nervous system.  Taking certain medicines, such as blood pressure medicine or diuretics.  Being pregnant. What are the signs or symptoms? Common symptoms of this condition include:  Weakness.  Light-headedness.  Dizziness.  Blurred vision.  Fatigue.  Rapid heartbeat.  Fainting, in severe cases. How is this diagnosed? This condition is diagnosed based on:  Your medical history.  Your symptoms.  Your blood pressure measurement. Your health care provider will check your blood pressure when you are: ? Lying down. ? Sitting. ? Standing. A blood pressure reading is recorded as two numbers, such as "120 over 80" (or 120/80). The first ("top")  number is called the systolic pressure. It is a measure of the pressure in your arteries as your heart beats. The second ("bottom") number is called the diastolic pressure. It is a measure of the pressure in your arteries when your heart relaxes between beats. Blood pressure is measured in a unit called mm Hg. Healthy blood pressure for most adults is 120/80. If your blood pressure is below 90/60, you may be diagnosed with hypotension. Other information or tests that may be used to diagnose hypotension include:  Your other vital signs, such as your heart rate and temperature.  Blood tests.  Tilt table test. For this test, you will be safely secured to a table that moves you from a lying position to an upright position. Your heart rhythm and blood pressure will be monitored during the test. How is this treated? Treatment for this condition may include:  Changing your diet. This may involve eating more salt (sodium) or drinking more water.  Taking medicines to raise your blood pressure.  Changing the dosage of certain medicines you are taking that might be lowering your blood pressure.  Wearing compression stockings. These stockings help to prevent blood clots and reduce swelling in your legs. In some cases, you may need to go to the hospital for:  Fluid replacement. This means you will receive fluids through an IV.  Blood replacement. This means you will receive donated blood through an IV (transfusion).  Treating an infection or heart problems, if this applies.  Monitoring. You may need to be monitored while medicines that you are taking wear off. Follow these instructions at home: Eating and drinking   Drink enough fluid to keep your   urine pale yellow.  Eat a healthy diet, and follow instructions from your health care provider about eating or drinking restrictions. A healthy diet includes: ? Fresh fruits and vegetables. ? Whole grains. ? Lean meats. ? Low-fat dairy  products.  Eat extra salt only as directed. Do not add extra salt to your diet unless your health care provider told you to do that.  Eat frequent, small meals.  Avoid standing up suddenly after eating. Medicines  Take over-the-counter and prescription medicines only as told by your health care provider. ? Follow instructions from your health care provider about changing the dosage of your current medicines, if this applies. ? Do not stop or adjust any of your medicines on your own. General instructions   Wear compression stockings as told by your health care provider.  Get up slowly from lying down or sitting positions. This gives your blood pressure a chance to adjust.  Avoid hot showers and excessive heat as directed by your health care provider.  Return to your normal activities as told by your health care provider. Ask your health care provider what activities are safe for you.  Do not use any products that contain nicotine or tobacco, such as cigarettes, e-cigarettes, and chewing tobacco. If you need help quitting, ask your health care provider.  Keep all follow-up visits as told by your health care provider. This is important. Contact a health care provider if you:  Vomit.  Have diarrhea.  Have a fever for more than 2-3 days.  Feel more thirsty than usual.  Feel weak and tired. Get help right away if you:  Have chest pain.  Have a fast or irregular heartbeat.  Develop numbness in any part of your body.  Cannot move your arms or your legs.  Have trouble speaking.  Become sweaty or feel light-headed.  Faint.  Feel short of breath.  Have trouble staying awake.  Feel confused. Summary  Hypotension is when the force of blood pumping through your arteries is too weak.  Hypotension may be harmless (benign) or may cause serious problems (be critical).  Treatment for this condition may include changing your diet, changing your medicines, and wearing  compression stockings.  In some cases, you may need to go to the hospital for fluid or blood replacement. This information is not intended to replace advice given to you by your health care provider. Make sure you discuss any questions you have with your health care provider. Document Revised: 09/19/2017 Document Reviewed: 09/19/2017 Elsevier Patient Education  2020 [employer]  

## 2019-05-05 ENCOUNTER — Encounter: Payer: Self-pay | Admitting: Nurse Practitioner

## 2019-05-05 ENCOUNTER — Encounter: Payer: Self-pay | Admitting: Internal Medicine

## 2019-05-05 ENCOUNTER — Ambulatory Visit: Payer: Medicare Other | Admitting: Nurse Practitioner

## 2019-05-05 ENCOUNTER — Other Ambulatory Visit: Payer: Self-pay

## 2019-05-05 VITALS — BP 119/69 | HR 57 | Temp 96.0°F | Ht 62.0 in | Wt 178.0 lb

## 2019-05-05 DIAGNOSIS — K5900 Constipation, unspecified: Secondary | ICD-10-CM | POA: Diagnosis not present

## 2019-05-05 DIAGNOSIS — R634 Abnormal weight loss: Secondary | ICD-10-CM | POA: Insufficient documentation

## 2019-05-05 NOTE — Patient Instructions (Signed)
Your health issues we discussed today were:   Constipation: 1. I am glad your current constipation medicines are working! 2. Continue take your constipation medicines like you have been 3. Call us if you have any worsening or severe symptoms  Weight loss: 1. As we discussed, try to adopt a feeling of "eat to live" 2. Try to have 2-3 at least small meals a day 3. Use protein drinks and supplements such as Atkins, Premier protein, muscle milk, or other brands. 4. Try to have 2 protein drinks snacks a day to ensure you are getting adequate nutrition 5. Monitor your weight let us know if you have worsening or more significant weight loss 6. On follow-up we can discuss further evaluation or testing if you are continuing to have weight loss despite improved nutrition  Overall I recommend:  1. Continue your other current medications 2. Return for follow-up in 6 months 3. Call us if you have any questions or concerns 4. Again, I commend you for taking care of your son and his times of need!!   ---------------------------------------------------------------  COVID-19 Vaccine Information can be found at: [email or web address] For questions related to vaccine distribution or appointments, please email [email or web address] or call [phone].   ---------------------------------------------------------------   At [facility] we value your feedback. You may receive a survey about your visit today. Please share your experience as we strive to create trusting relationships with our patients to provide genuine, compassionate, quality care.  We appreciate your understanding and patience as we review any laboratory studies, imaging, and other diagnostic tests that are ordered as we care for you. Our office policy is 5 business days for review of these results, and any emergent or urgent results are addressed in a timely manner  for your best interest. If you do not hear from our office in 1 week, please contact us.   We also encourage the use of MyChart, which contains your medical information for your review as well. If you are not enrolled in this feature, an access code is on this after visit summary for your convenience. Thank you for allowing [place] to be involved in your care.  It was great to see you today!  I hope you have a great day!!

## 2019-05-05 NOTE — Progress Notes (Signed)
Referring Provider: Sharion Balloon, FNP Primary Care Physician:  Sharion Balloon, FNP Primary GI:  Dr. Gala Romney  Chief Complaint  Patient presents with  . Follow-up    constipation, pt had recently wt loss    HPI:   Rhonda Garcia is a 79 y.o. female who presents for follow-up on constipation and abdominal pain.  The patient was last seen in our office 01/01/2019 for the same.  Known history of diverticulosis and internal hemorrhoids.  EGD on file 2017 with grade a esophagitis status post dilation.  Linzess historically controls her constipation.  History of anemia.  CT recently on file without acute findings.  Chronic anticoagulation for A. fib on warfarin.  IBS constipation symptoms including constipation with abdominal discomfort that resolves after a bowel movement.  At her last visit she was on Linzess 145 mcg and MiraLAX 1-2 times a day as needed.  Colonoscopy up-to-date 10/02/2018 with diverticulosis, two 45 mm polyps that were tubular adenoma and recommended no future colonoscopy unless new symptoms develop, due to age.  At her last visit she noted Linzess stopped working for a bit, but only taking it as needed otherwise she has loose stools if she takes it daily.  She has been trying hot prune juice with [place] 5-6 stools, when they occur, with straining.  Intermittent nausea with constipation.  No other overt GI complaints.  Recommended take Linzess once a day, every day and call in 1 to 2 weeks with progress report.  Diarrhea may resolve on its own.  Can consider dose reduction if necessary.  Continue other medications and follow-up in 4 months.  Today she states she's doing ok overall. Has had a 13 lb weight loss (subjectively) that she thinks is due to stress; she cares for her son at home and doesn't want to put him in a nursing home. Due to this she feels she sometimes forgets to eat. Objectively she is down about 12 lbs in the past 4 months. She is on Eliquis. She has  intermittent dizziness for which she takes meclizine. Constipation is doing well. She takes OTC medications at night which has been working well for her. Rare constipation at this point; does have nausea with constipation when it does occur but no vomiting in quite some time. Rare to no abdominal pain. Denies hematochezia, melena, fever, chills. She doesn't really have much of an appetite but feels this is all due to stress. She notes she has to change her son when he has a bowel movement and has a weak stomach, which kills her appetite. She is also now separated from her husband because he didn't want her to care for her son at home. She eats smaller meals. When her daughter was her she had her drinking protein drinks for nutrition.   Past Medical History:  Diagnosis Date  . A-fib ([place])   . Anal fissure    resolved   . Anemia   . Back pain    with left radiculopathy  . CAD (coronary artery disease)   . Cataract   . Collagen vascular disease ([place])   . DDD (degenerative disc disease)   . Depression   . Diabetes mellitus   . Diverticulosis   . Dyslipidemia   . Dysrhythmia    AFib  . Esophagitis   . GERD (gastroesophageal reflux disease)   . Hiatal hernia   . Hx of peptic ulcer   . Hyperlipidemia   . Hypertension   . Hypothyroidism   .  Internal hemorrhoids 2017  . NSTEMI (non-ST elevated myocardial infarction) ([place]) 08/05/2015  . Osteopenia   . Sleep apnea    not using CPAP now, could not tolerate and PCP aware.  . Thyroid disease     Past Surgical History:  Procedure Laterality Date  . ABDOMINAL HYSTERECTOMY Bilateral 2001 - approximate  . APPENDECTOMY    . BREAST REDUCTION SURGERY    . CARDIAC CATHETERIZATION N/A 08/08/2015   Procedure: Left Heart Cath and Coronary Angiography;  Surgeon: Burnell Blanks, MD;  Location: [facility];  Service: Cardiovascular;  Laterality: N/A;  . CHOLECYSTECTOMY  2008 - approximate  . COLONOSCOPY  06/2007   Friable anal canal and  anal papillae. Pancolonic diverticula. Normal terminal ileum. Status post segmental biopsy, descending colon biopsy showed minimal cryptitis. Biopsies felt to be  nonspecific but could be seen with NSAIDs. No features of inflammatory bowel disease. Stool studies were negative.  . COLONOSCOPY  03/21/2011   RMR: colonic polyps treated as described above, colonic diverticulosis (Tubular adenomas)  . COLONOSCOPY WITH PROPOFOL N/A 06/17/2014   RMR: Colonic diverticulosis. Multiple colonic polyps removed as described above.   . COLONOSCOPY WITH PROPOFOL N/A 03/19/2016   Procedure: COLONOSCOPY WITH PROPOFOL;  Surgeon: Daneil Dolin, MD;  [facility];  Service: Endoscopy;  Laterality: N/A;  8:45am  . COLONOSCOPY WITH PROPOFOL N/A 10/02/2018   Procedure: COLONOSCOPY WITH PROPOFOL;  Surgeon: Daneil Dolin, MD;  [facility];  Service: Endoscopy;  Laterality: N/A;  9:15am  . CORONARY ANGIOPLASTY WITH STENT PLACEMENT     4 stents  . ESOPHAGEAL DILATION N/A 06/17/2014   Procedure: ESOPHAGEAL DILATION;  Surgeon: Daneil Dolin, MD;  Location: [facility];  Service: Endoscopy;  Laterality: N/A;  Maloney 79 Fr  . ESOPHAGOGASTRODUODENOSCOPY  06/2007   Small hiatal hernia  . ESOPHAGOGASTRODUODENOSCOPY  03/21/2011   RMR: normal esophagus- status post passage of Maloney dilator. Small hiatal hernia. Trivial antral and bulbar erosions  . ESOPHAGOGASTRODUODENOSCOPY (EGD) WITH PROPOFOL N/A 06/17/2014   RMR: Small hiatal hernia; otherwise normal EGD. Status post passage of a Maloney dilator. No explanation for patients right upper quadrant abdominal pain.   Marland Kitchen ESOPHAGOGASTRODUODENOSCOPY (EGD) WITH PROPOFOL N/A 03/19/2016   Procedure: ESOPHAGOGASTRODUODENOSCOPY (EGD) WITH PROPOFOL;  Surgeon: Daneil Dolin, MD;  [facility];  Service: Endoscopy;  Laterality: N/A;  . KNEE SURGERY Left    arthroscopy  . LIPOMA EXCISION  03/17/2012   Procedure: EXCISION LIPOMA;  Surgeon: Gwenyth Ober, MD;   Location: [facility];  Service: General;  Laterality: Right;  excision of right lower back lipoma  . MALONEY DILATION N/A 03/19/2016   Procedure: Venia Minks DILATION;  Surgeon: Daneil Dolin, MD;  [facility];  Service: Endoscopy;  Laterality: N/A;  . POLYPECTOMY  06/17/2014   Procedure: POLYPECTOMY;  Surgeon: Daneil Dolin, MD;  [facility];  Service: Endoscopy;;  . POLYPECTOMY  10/02/2018   Procedure: POLYPECTOMY;  Surgeon: Daneil Dolin, MD;  [facility];  Service: Endoscopy;;  colon    Current Outpatient Medications  Medication Sig Dispense Refill  . ALPRAZolam (XANAX) 0.5 MG tablet Take 1 tablet (0.5 mg total) by mouth at bedtime as needed. 30 tablet 4  . apixaban (ELIQUIS) 5 MG TABS tablet Take 1 tablet (5 mg total) by mouth 2 (two) times daily. 60 tablet 11  . escitalopram (LEXAPRO) 10 MG tablet Take 1 tablet (10 mg total) by mouth daily. 90 tablet  3  . isosorbide mononitrate (IMDUR) 60 MG 24 hr tablet TAKE ONE (1) TABLET EACH DAY (Patient taking differently: Take 60 mg by mouth daily. ** DO NOT CRUSH **) 90 tablet 3  . levothyroxine (SYNTHROID) 50 MCG tablet TAKE ONE (1) TABLET EACH DAY 90 tablet 3  . meclizine (ANTIVERT) 25 MG tablet Take 1 tablet (25 mg total) by mouth 3 (three) times daily as needed for dizziness. (Patient taking differently: Take 25 mg by mouth as needed for dizziness. ) 30 tablet 0  . metFORMIN (GLUCOPHAGE) 1000 MG tablet TAKE ONE TABLET TWICE A DAY WITH FOOD 180 tablet 2  . metoprolol tartrate (LOPRESSOR) 50 MG tablet TAKE ONE TABLET BY MOUTH TWICE DAILY 180 tablet 0  . nitroGLYCERIN (NITROSTAT) 0.4 MG SL tablet Place 1 tablet (0.4 mg total) under the tongue every 5 (five) minutes as needed for chest pain. 20 tablet 1  . pantoprazole (PROTONIX) 40 MG tablet Take 1 tablet (40 mg total) by mouth 2 (two) times daily. 180 tablet 3  . pravastatin (PRAVACHOL) 80 MG tablet TAKE 1 TABLET DAILY IN THE EVENING 90 tablet 0   No  current facility-administered medications for this visit.    Allergies as of 05/05/2019 - Review Complete 05/05/2019  Allergen Reaction Noted  . Promethazine hcl Anaphylaxis 09/06/2010    Family History  Problem Relation Age of Onset  . Heart attack Mother   . Hypertension Mother   . Stroke Mother        Deceased, age 23  . Diabetes Sister   . Diabetes Sister   . Cancer Sister        ovarian  . Stroke Sister   . Diabetes Brother        also has a blood disorder   . Clotting disorder Brother   . Kidney disease Brother   . Heart disease Brother   . Melanoma Brother        deceased  . Coronary artery disease Other   . Kidney disease Other   . Diabetes Son   . Hypertension Son   . Coronary artery disease Son 49       CABG  . Hypertension Son   . Coronary artery disease Son 83       CABG  . Colon cancer Neg Hx     Social History   Socioeconomic History  . Marital status: Married    Spouse name: Not on file  . Number of children: 4  . Years of education: 6  . Highest education level: 6th grade  Occupational History  . Occupation: Retired    Comment: Charity fundraiser  Tobacco Use  . Smoking status: Never Smoker  . Smokeless tobacco: Never Used  Substance and Sexual Activity  . Alcohol use: Not Currently    Alcohol/week: 1.0 standard drinks    Types: 1 Glasses of wine per week  . Drug use: No  . Sexual activity: Yes    Birth control/protection: Post-menopausal  Other Topics Concern  . Not on file  Social History Narrative   Widowed but is remarrying in July 2019. Has 4 adult children. Lives in a house with fiance.    Social Determinants of Health   Financial Resource Strain:   . Difficulty of Paying Living Expenses: Not on file  Food Insecurity:   . Worried About Charity fundraiser in the Last Year: Not on file  . Ran Out of Food in the Last Year: Not on file  Transportation Needs:   .  Lack of Transportation (Medical): Not on file  . Lack of  Transportation (Non-Medical): Not on file  Physical Activity:   . Days of Exercise per Week: Not on file  . Minutes of Exercise per Session: Not on file  Stress:   . Feeling of Stress : Not on file  Social Connections:   . Frequency of Communication with Friends and Family: Not on file  . Frequency of Social Gatherings with Friends and Family: Not on file  . Attends Religious Services: Not on file  . Active Member of Clubs or Organizations: Not on file  . Attends Archivist Meetings: Not on file  . Marital Status: Not on file    Review of Systems: General: Negative for anorexia, fever, chills, fatigue, weakness. ENT: Negative for hoarseness, difficulty swallowing. CV: Negative for chest pain, angina, palpitations, peripheral edema.  Respiratory: Negative for dyspnea at rest, cough, sputum, wheezing.  GI: See history of present illness. Neuro: Admits what sounds to be orthostatic hypotension; is following-up with her PCP.  Endo: Negative for unusual weight change.  Heme: Negative for bruising or bleeding. Allergy: Negative for rash or hives.   Physical Exam: BP 119/69   Pulse (!) 57   Temp (!) 96 F (35.6 C)   Ht 5\' 2"  (1.575 m)   Wt 178 lb (80.7 kg)   BMI 32.56 kg/m  General:   Alert and oriented. Pleasant and cooperative. Well-nourished and well-developed.  Eyes:  Without icterus, sclera clear and conjunctiva pink.  Ears:  Normal auditory acuity. Cardiovascular:  S1, S2 present without murmurs appreciated. Extremities without clubbing or edema. Respiratory:  Clear to auscultation bilaterally. No wheezes, rales, or rhonchi. No distress.  Gastrointestinal:  +BS, soft, non-tender and non-distended. No HSM noted. No guarding or rebound. No masses appreciated.  Rectal:  Deferred  Musculoskalatal:  Symmetrical without gross deformities. Neurologic:  Alert and oriented x4;  grossly normal neurologically. Psych:  Alert and cooperative. Normal mood and  affect. Heme/Lymph/Immune: No excessive bruising noted.    05/06/2019 10:51 AM   Disclaimer: This note was dictated with voice recognition software. Similar sounding words can inadvertently be transcribed and may not be corrected upon review.

## 2019-05-06 ENCOUNTER — Encounter: Payer: Self-pay | Admitting: Nurse Practitioner

## 2019-05-06 NOTE — Assessment & Plan Note (Signed)
The patient notes an approximate 30 pound weight loss objectively (confirmed with 12 pound weight loss objectively since her last visit).  She is under significant stress as her son had a stroke and requires total care.  She brought him into her home and cares for him at home.  Because of stress and how busy she is caring for him she often will forget to eat.  She also notes her husband separated from her because he did not agree with bringing her son home and felt that he should be put in a nursing home.  Also when she is changing his stools she has a weak stomach and this kills her appetite.  I am not overtly concerned giving the ample explanation for her weight loss.  I discussed the mantra of the need to "eat to live."  Monitor her weight.  Increase protein drinks 2-3 a day to make sure she is getting adequate nutrition.  Notify us of any worsening weight loss.  Follow-up in 6 months.  She has continued weight loss we can discuss further evaluation options.

## 2019-05-06 NOTE — Progress Notes (Signed)
Cc'ed to pcp °

## 2019-05-06 NOTE — Assessment & Plan Note (Signed)
Currently well controlled on over-the-counter medications.  Recommend she continue her medications and monitor for any worsening symptoms.  Follow-up in 6 months and notify us of worsening constipation.

## 2019-05-11 ENCOUNTER — Other Ambulatory Visit: Payer: Self-pay

## 2019-05-12 ENCOUNTER — Encounter: Payer: Self-pay | Admitting: Family

## 2019-05-12 ENCOUNTER — Ambulatory Visit (INDEPENDENT_AMBULATORY_CARE_PROVIDER_SITE_OTHER): Payer: Medicare Other | Admitting: Family

## 2019-05-12 ENCOUNTER — Other Ambulatory Visit: Payer: Self-pay | Admitting: Family

## 2019-05-12 ENCOUNTER — Other Ambulatory Visit: Payer: Self-pay | Admitting: Cardiology

## 2019-05-12 VITALS — BP 139/65 | HR 59 | Temp 96.0°F | Ht 62.0 in | Wt 178.6 lb

## 2019-05-12 DIAGNOSIS — I10 Essential (primary) hypertension: Secondary | ICD-10-CM | POA: Diagnosis not present

## 2019-05-12 DIAGNOSIS — F331 Major depressive disorder, recurrent, moderate: Secondary | ICD-10-CM | POA: Diagnosis not present

## 2019-05-12 DIAGNOSIS — F411 Generalized anxiety disorder: Secondary | ICD-10-CM

## 2019-05-12 DIAGNOSIS — I959 Hypotension, unspecified: Secondary | ICD-10-CM | POA: Diagnosis not present

## 2019-05-12 MED ORDER — LISINOPRIL 10 MG PO TABS: 10.0000 mg | ORAL_TABLET | Freq: Every day | ORAL | 3 refills | Status: DC

## 2019-05-12 MED ORDER — ALPRAZOLAM 0.5 MG PO TABS: 0.5000 mg | ORAL_TABLET | Freq: Every evening | ORAL | 4 refills | Status: DC | PRN

## 2019-05-12 NOTE — Patient Instructions (Signed)

## 2019-05-12 NOTE — Progress Notes (Signed)
Subjective:    Patient ID: Rhonda Garcia, female    DOB: [birth year], 79 y.o.   MRN: [id number]  Chief Complaint  Patient presents with  . recheck hypotension   PT presents to the office today to recheck hypotension and dizziness. She was seen on 05/01/19 and we stopped her  Norvasc 5 mg and HCTZ. We also decreased her lisinopril to 10 mg from 20 mg. Her BP is at goal today. She reports her dizziness is improved, but still comes and goes.  Hypertension This is a chronic problem. The current episode started more than 1 year ago. The problem has been resolved since onset. The problem is controlled. Associated symptoms include anxiety and malaise/fatigue. Pertinent negatives include no orthopnea or shortness of breath. Past treatments include ACE inhibitors. The current treatment provides moderate improvement.  Anxiety Presents for follow-up visit. Symptoms include depressed mood, excessive worry, irritability, nervous/anxious behavior and restlessness. Patient reports no shortness of breath. Symptoms occur occasionally. The severity of symptoms is moderate.        Review of Systems  Constitutional: Positive for irritability and malaise/fatigue.  Respiratory: Negative for shortness of breath.   Cardiovascular: Negative for orthopnea.  Psychiatric/Behavioral: The patient is nervous/anxious.   All other systems reviewed and are negative.      Objective:   Physical Exam Vitals reviewed.  Constitutional:      General: She is not in acute distress.    Appearance: She is well-developed. She is obese.  HENT:     Head: Normocephalic and atraumatic.     Right Ear: Tympanic membrane normal.     Left Ear: Tympanic membrane normal.  Eyes:     Pupils: Pupils are equal, round, and reactive to light.  Neck:     Thyroid: No thyromegaly.  Cardiovascular:     Rate and Rhythm: Normal rate and regular rhythm.     Heart sounds: Normal heart sounds. No murmur.  Pulmonary:     Effort: Pulmonary  effort is normal. No respiratory distress.     Breath sounds: Normal breath sounds. No wheezing.  Abdominal:     General: Bowel sounds are normal. There is no distension.     Palpations: Abdomen is soft.     Tenderness: There is no abdominal tenderness.  Musculoskeletal:        General: No tenderness. Normal range of motion.     Cervical back: Normal range of motion and neck supple.  Skin:    General: Skin is warm and dry.  Neurological:     Mental Status: She is alert and oriented to person, place, and time.     Cranial Nerves: No cranial nerve deficit.     Deep Tendon Reflexes: Reflexes are normal and symmetric.  Psychiatric:        Behavior: Behavior normal.        Thought Content: Thought content normal.        Judgment: Judgment normal.       BP 139/65   Pulse (!) 59   Temp (!) 96 F (35.6 C) (Temporal)   Ht 5\' 2"  (1.575 m)   Wt 178 lb 9.6 oz (81 kg)   SpO2 100%   BMI 32.67 kg/m      Assessment & Plan:  VANNA PETZOLDT comes in today with chief complaint of recheck hypotension   Diagnosis and orders addressed:  1. Essential hypertension -Continue current medications, BP  Is at goal today - lisinopril (ZESTRIL) 10 MG tablet; Take 1  tablet (10 mg total) by mouth daily.  Dispense: 90 tablet; Refill: 3  2. Hypotension, unspecified hypotension type -Resolved  3. Morbid obesity ([place])  4. Moderate episode of recurrent major depressive disorder ([place])  5. GAD (generalized anxiety disorder)  - ALPRAZolam (XANAX) 0.5 MG tablet; Take 1 tablet (0.5 mg total) by mouth at bedtime as needed.  Dispense: 30 tablet; Refill: [place], FNP

## 2019-05-13 ENCOUNTER — Other Ambulatory Visit (HOSPITAL_COMMUNITY): Payer: Self-pay | Admitting: Family

## 2019-05-13 DIAGNOSIS — R928 Other abnormal and inconclusive findings on diagnostic imaging of breast: Secondary | ICD-10-CM

## 2019-05-21 ENCOUNTER — Ambulatory Visit (INDEPENDENT_AMBULATORY_CARE_PROVIDER_SITE_OTHER): Payer: Medicare Other | Admitting: *Deleted

## 2019-05-21 DIAGNOSIS — Z0000 Encounter for general adult medical examination without abnormal findings: Secondary | ICD-10-CM | POA: Diagnosis not present

## 2019-05-21 NOTE — Progress Notes (Addendum)
MEDICARE ANNUAL WELLNESS VISIT  05/21/2019  Telephone Visit Disclaimer This Medicare AWV was conducted by telephone due to national recommendations for restrictions regarding the COVID-19 Pandemic (e.g. social distancing).  I verified, using two identifiers, that I am speaking with Rhonda Garcia or their authorized healthcare agent. I discussed the limitations, risks, security, and privacy concerns of performing an evaluation and management service by telephone and the potential availability of an in-person appointment in the future. The patient expressed understanding and agreed to proceed.   Subjective:  Rhonda Garcia is a 79 y.o. female patient of Hawks, Theador Hawthorne, FNP who had a Medicare Annual Wellness Visit today via telephone. Rhonda Garcia is Retired and lives alone but her son recently moved in due to having a stroke and needs a full time caregiver.She is her sons primary caregiver but was able to get an aid to come in a few hours a day to help out.She recently separated from her husband due to the increased stress with her sons health.  she has 4 children. she reports that she is socially active and does interact with friends/family regularly. she is minimally physically active and enjoys coloring and doing word puzzles.  Patient Care Team: Sharion Balloon, FNP as PCP - General (Nurse Practitioner) Daneil Dolin, MD (Gastroenterology) Minus Breeding, MD as Consulting Physician (Cardiology) Raeanne Gathers, AUD as Audiologist (Audiology) Melina Schools, OD as Consulting Physician (Optometry)  Advanced Directives 05/21/2019 10/02/2018 09/26/2018 09/04/2018 04/30/2018 04/17/2018 04/15/2018  Does Patient Have a Medical Advance Directive? No - No No No No No  Would patient like information on creating a medical advance directive? No - Patient declined No - Patient declined No - Patient declined No - Guardian declined No - Patient declined - Yes (Inpatient - patient requests chaplain consult to create a  medical advance directive)  Pre-existing out of facility DNR order (yellow form or pink MOST form) - - - - - - -    Hospital Utilization Over the Past 12 Months: # of hospitalizations or ER visits: 0 # of surgeries: 0  Review of Systems    Patient reports that her overall health is unchanged compared to last year.  History obtained from chart review  Patient Reported Readings (BP, Pulse, CBG, Weight, etc) none  Pain Assessment Pain : No/denies pain     Current Medications & Allergies (verified) Allergies as of 05/21/2019       Reactions   Promethazine Hcl Anaphylaxis        Medication List        Accurate as of May 21, 2019  9:43 AM. If you have any questions, ask your nurse or doctor.          ALPRAZolam 0.5 MG tablet Commonly known as: XANAX Take 1 tablet (0.5 mg total) by mouth at bedtime as needed.   apixaban 5 MG Tabs tablet Commonly known as: ELIQUIS Take 1 tablet (5 mg total) by mouth 2 (two) times daily.   escitalopram 10 MG tablet Commonly known as: Lexapro Take 1 tablet (10 mg total) by mouth daily.   isosorbide mononitrate 60 MG 24 hr tablet Commonly known as: IMDUR TAKE ONE (1) TABLET EACH DAY   levothyroxine 50 MCG tablet Commonly known as: SYNTHROID TAKE ONE (1) TABLET EACH DAY   lisinopril 10 MG tablet Commonly known as: ZESTRIL Take 1 tablet (10 mg total) by mouth daily.   meclizine 25 MG tablet Commonly known as: ANTIVERT Take 1 tablet (25  mg total) by mouth 3 (three) times daily as needed for dizziness. What changed: when to take this   metFORMIN 1000 MG tablet Commonly known as: GLUCOPHAGE TAKE ONE TABLET TWICE A DAY WITH FOOD   metoprolol tartrate 50 MG tablet Commonly known as: LOPRESSOR TAKE ONE TABLET BY MOUTH TWICE DAILY   nitroGLYCERIN 0.4 MG SL tablet Commonly known as: NITROSTAT Place 1 tablet (0.4 mg total) under the tongue every 5 (five) minutes as needed for chest pain.   pantoprazole 40 MG  tablet Commonly known as: PROTONIX Take 1 tablet (40 mg total) by mouth 2 (two) times daily.   pravastatin 80 MG tablet Commonly known as: PRAVACHOL TAKE 1 TABLET DAILY IN THE EVENING        History (reviewed): Past Medical History:  Diagnosis Date   A-fib ([place])    Anal fissure    resolved    Anemia    Back pain    with left radiculopathy   CAD (coronary artery disease)    Cataract    Collagen vascular disease (HCC)    DDD (degenerative disc disease)    Depression    Diabetes mellitus    Diverticulosis    Dyslipidemia    Dysrhythmia    AFib   Esophagitis    GERD (gastroesophageal reflux disease)    Hiatal hernia    Hx of peptic ulcer    Hyperlipidemia    Hypertension    Hypothyroidism    Internal hemorrhoids 2017   NSTEMI (non-ST elevated myocardial infarction) ([place]) 08/05/2015   Osteopenia    Sleep apnea    not using CPAP now, could not tolerate and PCP aware.   Thyroid disease    Past Surgical History:  Procedure Laterality Date   ABDOMINAL HYSTERECTOMY Bilateral 2001 - approximate   APPENDECTOMY     BREAST REDUCTION SURGERY     CARDIAC CATHETERIZATION N/A 08/08/2015   Procedure: Left Heart Cath and Coronary Angiography;  Surgeon: Burnell Blanks, MD;  Location: [facility];  Service: Cardiovascular;  Laterality: N/A;   CHOLECYSTECTOMY  2008 - approximate   COLONOSCOPY  06/2007   Friable anal canal and anal papillae. Pancolonic diverticula. Normal terminal ileum. Status post segmental biopsy, descending colon biopsy showed minimal cryptitis. Biopsies felt to be  nonspecific but could be seen with NSAIDs. No features of inflammatory bowel disease. Stool studies were negative.   COLONOSCOPY  03/21/2011   RMR: colonic polyps treated as described above, colonic diverticulosis (Tubular adenomas)   COLONOSCOPY WITH PROPOFOL N/A 06/17/2014   RMR: Colonic diverticulosis. Multiple colonic polyps removed as described above.    COLONOSCOPY WITH PROPOFOL N/A  03/19/2016   Procedure: COLONOSCOPY WITH PROPOFOL;  Surgeon: Daneil Dolin, MD;  [facility];  Service: Endoscopy;  Laterality: N/A;  8:45am   COLONOSCOPY WITH PROPOFOL N/A 10/02/2018   Procedure: COLONOSCOPY WITH PROPOFOL;  Surgeon: Daneil Dolin, MD;  [facility];  Service: Endoscopy;  Laterality: N/A;  9:15am   CORONARY ANGIOPLASTY WITH STENT PLACEMENT     4 stents   ESOPHAGEAL DILATION N/A 06/17/2014   Procedure: ESOPHAGEAL DILATION;  Surgeon: Daneil Dolin, MD;  Location: [facility];  Service: Endoscopy;  Laterality: N/A;  Maloney 50 Fr   ESOPHAGOGASTRODUODENOSCOPY  06/2007   Small hiatal hernia   ESOPHAGOGASTRODUODENOSCOPY  03/21/2011   RMR: normal esophagus- status post passage of Maloney dilator. Small hiatal hernia. Trivial antral and bulbar erosions   ESOPHAGOGASTRODUODENOSCOPY (EGD) WITH PROPOFOL N/A 06/17/2014  RMR: Small hiatal hernia; otherwise normal EGD. Status post passage of a Maloney dilator. No explanation for patients right upper quadrant abdominal pain.    ESOPHAGOGASTRODUODENOSCOPY (EGD) WITH PROPOFOL N/A 03/19/2016   Procedure: ESOPHAGOGASTRODUODENOSCOPY (EGD) WITH PROPOFOL;  Surgeon: Daneil Dolin, MD;  [facility];  Service: Endoscopy;  Laterality: N/A;   KNEE SURGERY Left    arthroscopy   LIPOMA EXCISION  03/17/2012   Procedure: EXCISION LIPOMA;  Surgeon: Gwenyth Ober, MD;  Location: [facility];  Service: General;  Laterality: Right;  excision of right lower back lipoma   MALONEY DILATION N/A 03/19/2016   Procedure: Venia Minks DILATION;  Surgeon: Daneil Dolin, MD;  [facility];  Service: Endoscopy;  Laterality: N/A;   POLYPECTOMY  06/17/2014   Procedure: POLYPECTOMY;  Surgeon: Daneil Dolin, MD;  [facility];  Service: Endoscopy;;   POLYPECTOMY  10/02/2018   Procedure: POLYPECTOMY;  Surgeon: Daneil Dolin, MD;  [facility];  Service: Endoscopy;;  colon   Family History  Problem  Relation Age of Onset   Heart attack Mother    Hypertension Mother    Stroke Mother        Deceased, age 90   Diabetes Sister    Diabetes Sister    Cancer Sister        ovarian   Stroke Sister    Diabetes Brother        also has a blood disorder    Clotting disorder Brother    Kidney disease Brother    Heart disease Brother    Melanoma Brother        deceased   Coronary artery disease Other    Kidney disease Other    Diabetes Son    Hypertension Son    Coronary artery disease Son 39       CABG   Hypertension Son    Coronary artery disease Son 52       CABG   Colon cancer Neg Hx    Social History   Socioeconomic History   Marital status: Married    Spouse name: Not on file   Number of children: 4   Years of education: 6   Highest education level: 6th grade  Occupational History   Occupation: Retired    Comment: Charity fundraiser  Tobacco Use   Smoking status: Never Smoker   Smokeless tobacco: Never Used  Substance and Sexual Activity   Alcohol use: Not Currently    Alcohol/week: 1.0 standard drinks    Types: 1 Glasses of wine per week   Drug use: No   Sexual activity: Not Currently    Birth control/protection: Post-menopausal  Other Topics Concern   Not on file  Social History Narrative    Has 4 adult children. She lives home with one of her sons who recently had a stroke and needs 24 hour care. She is the primary caregiver and is now legally separated from her husband.    Social Determinants of Health   Financial Resource Strain: Low Risk    Difficulty of Paying Living Expenses: Not hard at all  Food Insecurity: No Food Insecurity   Worried About Charity fundraiser in the Last Year: Never true   [place] in the Last Year: Never true  Transportation Needs: No Transportation Needs   Lack of Transportation (Medical): No   Lack of Transportation (Non-Medical): No  Physical Activity: Insufficiently Active   Days of  Exercise per Week: 7 days   Minutes  of Exercise per Session: 10 min  Stress: Stress Concern Present   Feeling of Stress : Rather much  Social Connections: Slightly Isolated   Frequency of Communication with Friends and Family: More than three times a week   Frequency of Social Gatherings with Friends and Family: More than three times a week   Attends Religious Services: More than 4 times per year   Active Member of [employer] or Organizations: Yes   Attends Archivist Meetings: More than 4 times per year   Marital Status: Separated    Activities of Daily Living In your present state of health, do you have any difficulty performing the following activities: 05/21/2019 09/26/2018  Hearing? N N  Vision? Y Y  Comment has cataracts-needs to have surgery -  Difficulty concentrating or making decisions? N N  Walking or climbing stairs? N Y  Comment - arthritis  Dressing or bathing? N N  Doing errands, shopping? N N  Preparing Food and eating ? N -  Using the Toilet? N -  In the past six months, have you accidently leaked urine? Y -  Comment wears a pad at night -  Do you have problems with loss of bowel control? N -  Managing your Medications? N -  Managing your Finances? N -  Housekeeping or managing your Housekeeping? N -  Some recent data might be hidden    Patient Education/ Literacy How often do you need to have someone help you when you read instructions, pamphlets, or other written materials from your doctor or pharmacy?: 1 - Never What is the last grade level you completed in school?: 6th grade  Exercise Current Exercise Habits: Home exercise routine, Type of exercise: walking, Time (Minutes): 10, Frequency (Times/Week): 7, Weekly Exercise (Minutes/Week): 70, Intensity: Mild, Exercise limited by: orthopedic condition(s)  Diet Patient reports consuming 1 meals a day and 3 snack(s) a day Patient reports that her primary diet is: Regular Patient reports that she does have regular access to food.    Depression Screen PHQ 2/9 Scores 05/21/2019 05/12/2019 05/01/2019 02/05/2019 01/19/2019 12/19/2018 12/12/2018  PHQ - 2 Score 0 0 0 0 0 0 0  PHQ- 9 Score 4 - - - - - -     Fall Risk Fall Risk  05/21/2019 05/12/2019 05/01/2019 02/05/2019 01/19/2019  Falls in the past year? 0 0 0 0 0  Number falls in past yr: - - - - -  Comment - - - - -  Injury with Fall? - - - - -  Comment - - - - -  Risk Factor Category  - - - - -  Follow up - - - - -     Objective:  Rhonda Garcia seemed alert and oriented and she participated appropriately during our telephone visit.  Blood Pressure Weight BMI  BP Readings from Last 3 Encounters:  05/12/19 139/65  05/05/19 119/69  05/01/19 (!) 98/50   Wt Readings from Last 3 Encounters:  05/12/19 178 lb 9.6 oz (81 kg)  05/05/19 178 lb (80.7 kg)  05/01/19 177 lb 6 oz (80.5 kg)   BMI Readings from Last 1 Encounters:  05/12/19 32.67 kg/m    *Unable to obtain current vital signs, weight, and BMI due to telephone visit type  Hearing/Vision  Rhonda Garcia did not seem to have difficulty with hearing/understanding during the telephone conversation Reports that she has not had a formal eye exam by an eye care  professional within the past year Reports that she has not had a formal hearing evaluation within the past year *Unable to fully assess hearing and vision during telephone visit type  Cognitive Function: 6CIT Screen 05/21/2019  What Year? 0 points  What month? 0 points  What time? 0 points  Count back from 20 0 points  Months in reverse 4 points  Repeat phrase 2 points  Total Score 6   (Normal:0-7, Significant for Dysfunction: >8)  Normal Cognitive Function Screening: Yes   Immunization & Health Maintenance Record Immunization History  Administered Date(s) Administered   Fluad Quad(high Dose 65+) 12/19/2018   Influenza, High Dose Seasonal PF 02/07/2015, 01/21/2017, 02/12/2018   Influenza,inj,Quad PF,6+ Mos 01/19/2013, 05/20/2014, 01/10/2016   Pneumococcal  Conjugate-13 08/13/2013   Pneumococcal Polysaccharide-23 02/07/2015   Tdap 12/18/2012    Health Maintenance  Topic Date Due   OPHTHALMOLOGY EXAM  05/17/2017   MAMMOGRAM  11/05/2018   HEMOGLOBIN A1C  06/07/2019   FOOT EXAM  12/05/2019   DEXA SCAN  03/25/2020   COLONOSCOPY  10/01/2021   TETANUS/TDAP  12/19/2022   INFLUENZA VACCINE  Completed   PNA vac Low Risk Adult  Completed       Assessment  This is a routine wellness examination for Rhonda Garcia.  Health Maintenance: Due or Overdue Health Maintenance Due  Topic Date Due   OPHTHALMOLOGY EXAM  05/17/2017   MAMMOGRAM  11/05/2018    Rhonda Garcia does not need a referral for Community Assistance: Care Management:   no Social Work:    no Prescription Assistance:  no Nutrition/Diabetes Education:  no   Plan:  Personalized Goals Goals Addressed             This Visit's Progress    DIET - INCREASE WATER INTAKE       Try to drink 6-8 glasses of water daily       Personalized Health Maintenance & Screening Recommendations  Screening mammography Shingles vaccine  Lung Cancer Screening Recommended: no (Low Dose CT Chest recommended if Age 20-80 years, 30 pack-year currently smoking OR have quit w/in past 15 years) Hepatitis C Screening recommended: no HIV Screening recommended: no  Advanced Directives: Written information was not prepared per patient's request.  Referrals & Orders No orders of the defined types were placed in this encounter.   Follow-up Plan Follow-up with Sharion Balloon, FNP as planned Schedule your Diabetic Eye Exam as discussed Keep you appointment for your Mammogram 06/02/19   I have personally reviewed and noted the following in the patient's chart:   Medical and social history Use of alcohol, tobacco or illicit drugs  Current medications and supplements Functional ability and status Nutritional status Physical activity Advanced directives List of other  physicians Hospitalizations, surgeries, and ER visits in previous 12 months Vitals Screenings to include cognitive, depression, and falls Referrals and appointments  In addition, I have reviewed and discussed with Rhonda Garcia certain preventive protocols, quality metrics, and best practice recommendations. A written personalized care plan for preventive services as well as general preventive health recommendations is available and can be mailed to the patient at her request.      Milas Hock, LPN  [id number]   I have reviewed and agree with the above AWV documentation.   Evelina Dun, FNP

## 2019-05-21 NOTE — Patient Instructions (Signed)
Preventive Care 38 Years and Older, Female Preventive care refers to lifestyle choices and visits with your health care provider that can promote health and wellness. This includes:  A yearly physical exam. This is also called an annual well check.  Regular dental and eye exams.  Immunizations.  Screening for certain conditions.  Healthy lifestyle choices, such as diet and exercise. What can I expect for my preventive care visit? Physical exam Your health care provider will check:  Height and weight. These may be used to calculate body mass index (BMI), which is a measurement that tells if you are at a healthy weight.  Heart rate and blood pressure.  Your skin for abnormal spots. Counseling Your health care provider may ask you questions about:  Alcohol, tobacco, and drug use.  Emotional well-being.  Home and relationship well-being.  Sexual activity.  Eating habits.  History of falls.  Memory and ability to understand (cognition).  Work and work Statistician.  Pregnancy and menstrual history. What immunizations do I need?  Influenza (flu) vaccine  This is recommended every year. Tetanus, diphtheria, and pertussis (Tdap) vaccine  You may need a Td booster every 10 years. Varicella (chickenpox) vaccine  You may need this vaccine if you have not already been vaccinated. Zoster (shingles) vaccine  You may need this after age 33. Pneumococcal conjugate (PCV13) vaccine  One dose is recommended after age 33. Pneumococcal polysaccharide (PPSV23) vaccine  One dose is recommended after age 72. Measles, mumps, and rubella (MMR) vaccine  You may need at least one dose of MMR if you were born in 1957 or later. You may also need a second dose. Meningococcal conjugate (MenACWY) vaccine  You may need this if you have certain conditions. Hepatitis A vaccine  You may need this if you have certain conditions or if you travel or work in places where you may be exposed  to hepatitis A. Hepatitis B vaccine  You may need this if you have certain conditions or if you travel or work in places where you may be exposed to hepatitis B. Haemophilus influenzae type b (Hib) vaccine  You may need this if you have certain conditions. You may receive vaccines as individual doses or as more than one vaccine together in one shot (combination vaccines). Talk with your health care provider about the risks and benefits of combination vaccines. What tests do I need? Blood tests  Lipid and cholesterol levels. These may be checked every 5 years, or more frequently depending on your overall health.  Hepatitis C test.  Hepatitis B test. Screening  Lung cancer screening. You may have this screening every year starting at age 39 if you have a 30-pack-year history of smoking and currently smoke or have quit within the past 15 years.  Colorectal cancer screening. All adults should have this screening starting at age 36 and continuing until age 15. Your health care provider may recommend screening at age 23 if you are at increased risk. You will have tests every 1-10 years, depending on your results and the type of screening test.  Diabetes screening. This is done by checking your blood sugar (glucose) after you have not eaten for a while (fasting). You may have this done every 1-3 years.  Mammogram. This may be done every 1-2 years. Talk with your health care provider about how often you should have regular mammograms.  BRCA-related cancer screening. This may be done if you have a family history of breast, ovarian, tubal, or peritoneal cancers.  Other tests  Sexually transmitted disease (STD) testing.  Bone density scan. This is done to screen for osteoporosis. You may have this done starting at age 44. Follow these instructions at home: Eating and drinking  Eat a diet that includes fresh fruits and vegetables, whole grains, lean protein, and low-fat dairy products. Limit  your intake of foods with high amounts of sugar, saturated fats, and salt.  Take vitamin and mineral supplements as recommended by your health care provider.  Do not drink alcohol if your health care provider tells you not to drink.  If you drink alcohol: ? Limit how much you have to 0-1 drink a day. ? Be aware of how much alcohol is in your drink. In the [place] one drink equals one 12 oz bottle of beer (355 mL), one 5 oz glass of wine (148 mL), or one 1 oz glass of hard liquor (44 mL). Lifestyle  Take daily care of your teeth and gums.  Stay active. Exercise for at least 30 minutes on 5 or more days each week.  Do not use any products that contain nicotine or tobacco, such as cigarettes, e-cigarettes, and chewing tobacco. If you need help quitting, ask your health care provider.  If you are sexually active, practice safe sex. Use a condom or other form of protection in order to prevent STIs (sexually transmitted infections).  Talk with your health care provider about taking a low-dose aspirin or statin. What's next?  Go to your health care provider once a year for a well check visit.  Ask your health care provider how often you should have your eyes and teeth checked.  Stay up to date on all vaccines. This information is not intended to replace advice given to you by your health care provider. Make sure you discuss any questions you have with your health care provider. Document Revised: 03/20/2018 Document Reviewed: 03/20/2018 Elsevier Patient Education  2020 [employer].

## 2019-05-26 ENCOUNTER — Encounter (HOSPITAL_COMMUNITY): Payer: Self-pay

## 2019-05-26 ENCOUNTER — Other Ambulatory Visit: Payer: Self-pay

## 2019-05-26 ENCOUNTER — Ambulatory Visit (HOSPITAL_COMMUNITY)
Admission: RE | Admit: 2019-05-26 | Discharge: 2019-05-26 | Disposition: A | Discharge status: Home / Self Care | Payer: Medicare Other | Source: Ambulatory Visit | Attending: Family | Admitting: Family

## 2019-05-26 DIAGNOSIS — R928 Other abnormal and inconclusive findings on diagnostic imaging of breast: Secondary | ICD-10-CM

## 2019-05-26 DIAGNOSIS — N644 Mastodynia: Secondary | ICD-10-CM | POA: Diagnosis not present

## 2019-06-09 ENCOUNTER — Other Ambulatory Visit: Payer: Self-pay | Admitting: Family

## 2019-06-09 DIAGNOSIS — E1169 Type 2 diabetes mellitus with other specified complication: Secondary | ICD-10-CM

## 2019-06-09 DIAGNOSIS — E785 Hyperlipidemia, unspecified: Secondary | ICD-10-CM

## 2019-06-09 MED ORDER — PRAVASTATIN SODIUM 80 MG PO TABS: 80.0000 mg | ORAL_TABLET | Freq: Every evening | ORAL | 0 refills | Status: DC

## 2019-06-09 NOTE — Addendum Note (Signed)
Addended by: Antonietta Barcelona D on: 06/09/2019 04:17 PM   Modules accepted: Orders

## 2019-06-09 NOTE — Telephone Encounter (Signed)
Refill failed, resent 

## 2019-07-02 ENCOUNTER — Other Ambulatory Visit: Payer: Self-pay

## 2019-07-02 ENCOUNTER — Ambulatory Visit: Payer: Medicare Other | Attending: Internal Medicine

## 2019-07-02 DIAGNOSIS — Z23 Encounter for immunization: Secondary | ICD-10-CM

## 2019-07-02 NOTE — Progress Notes (Signed)
   Covid-19 Vaccination Clinic  Name:  Rhonda Garcia    MRN: [id number] DOB: 1940-09-08  07/02/2019  Rhonda Garcia was observed post Covid-19 immunization for 15 minutes without incident. She was provided with Vaccine Information Sheet and instruction to access the V-Safe system.   Rhonda Garcia was instructed to call 911 with any severe reactions post vaccine: Marland Kitchen Difficulty breathing  . Swelling of face and throat  . A fast heartbeat  . A bad rash all over body  . Dizziness and weakness   Immunizations Administered    Name Date Dose VIS Date Route   Moderna COVID-19 Vaccine 07/02/2019  9:38 AM 0.5 mL 03/10/2019 Intramuscular   Manufacturer: Moderna   Lot: [id number]   Toad [id number]

## 2019-07-20 ENCOUNTER — Telehealth: Payer: Self-pay | Admitting: Family

## 2019-07-20 NOTE — Telephone Encounter (Signed)
Appt made

## 2019-07-22 ENCOUNTER — Other Ambulatory Visit: Payer: Self-pay

## 2019-07-22 ENCOUNTER — Encounter: Payer: Self-pay | Admitting: Family Medicine

## 2019-07-22 ENCOUNTER — Ambulatory Visit (INDEPENDENT_AMBULATORY_CARE_PROVIDER_SITE_OTHER): Payer: Medicare Other | Admitting: Family Medicine

## 2019-07-22 VITALS — BP 166/66 | HR 70 | Temp 97.6°F | Ht 62.0 in | Wt 175.0 lb

## 2019-07-22 DIAGNOSIS — M2550 Pain in unspecified joint: Secondary | ICD-10-CM | POA: Diagnosis not present

## 2019-07-22 DIAGNOSIS — R110 Nausea: Secondary | ICD-10-CM | POA: Diagnosis not present

## 2019-07-22 MED ORDER — ONDANSETRON 4 MG PO TBDP: 4.0000 mg | ORAL_TABLET | Freq: Three times a day (TID) | ORAL | 0 refills | Status: DC | PRN

## 2019-07-22 MED ORDER — PREDNISONE 10 MG (21) PO TBPK: ORAL_TABLET | ORAL | 0 refills | Status: DC

## 2019-07-22 NOTE — Progress Notes (Signed)
BP (!) 166/66   Pulse 70   Temp 97.6 F (36.4 C)   Ht 5' 2"  (1.575 m)   Wt 79.4 kg   SpO2 98%   BMI 32.01 kg/m    Subjective:   Patient ID: Rhonda Garcia, female    DOB: 04-07-1941, 79 y.o.   MRN: [id number]  HPI: Rhonda Garcia is a 79 y.o. female presenting on 07/22/2019 for Arthritis and Nausea.  1. Osteoarthritis of Bilateral Hands, Bilateral Hips, and Back:  Rhonda Garcia presents to clinic today with chief complaint of bilateral hand and hip pain as well as thoracic and low back pain. She describes the pain as achy. Bending forward is the hardest and most painful motion for her back. She reports that it has been hard for her to hold things with her right hand. She also reports bilateral shoulder pain. She has tried heat and Tylenol Arthritis for the pain without much relief. Hydrocodone has provided relief in the past and she is requesting this medication again. 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  2. Nausea:  Rhonda Garcia reports nausea x a few weeks. She denies vomiting but says that she has dry heaved. She denies any constipation or diarrhea out of the ordinary. She is under a lot of family stress, which she believes is the cause of her nausea. She would like a medication for nausea.  She apparently saw her gastroenterologist and mentioned nausea to him but there was no etiology for her symptoms.   Relevant past medical, surgical, family and social history reviewed and updated as indicated. Interim medical history since our last visit reviewed. Allergies and medications reviewed and updated.  Review of Systems:  Per HPI unless specifically indicated above   Allergies as of 07/22/2019      Reactions   Promethazine Hcl Anaphylaxis      Medication List       Accurate as of July 22, 2019  5:02 PM. If you have any questions, ask your nurse or doctor.        STOP taking these medications   meclizine 25  MG tablet Commonly known as: ANTIVERT Stopped by: Ronnie Doss, DO     TAKE these medications   ALPRAZolam 0.5 MG tablet Commonly known as: XANAX Take 1 tablet (0.5 mg total) by mouth at bedtime as needed.   apixaban 5 MG Tabs tablet Commonly known as: ELIQUIS Take 1 tablet (5 mg total) by mouth 2 (two) times daily.   escitalopram 10 MG tablet Commonly known as: Lexapro Take 1 tablet (10 mg total) by mouth daily.   isosorbide mononitrate 60 MG 24 hr tablet Commonly known as: IMDUR TAKE ONE (1) TABLET EACH DAY   levothyroxine 50 MCG tablet Commonly known as: SYNTHROID TAKE ONE (1) TABLET EACH DAY   lisinopril 10 MG tablet Commonly known as: ZESTRIL Take 1 tablet (10 mg total) by mouth daily.   metFORMIN 1000 MG tablet Commonly known as: GLUCOPHAGE TAKE ONE TABLET TWICE A DAY WITH FOOD   metoprolol tartrate 50 MG tablet Commonly known as: LOPRESSOR TAKE ONE TABLET BY MOUTH TWICE DAILY   nitroGLYCERIN 0.4 MG SL tablet Commonly known as: NITROSTAT Place 1 tablet (0.4 mg total) under the tongue every 5 (five) minutes as needed for chest pain.   pantoprazole 40 MG tablet Commonly known as: PROTONIX Take 1 tablet (40 mg total) by mouth 2 (two) times daily.  pravastatin 80 MG tablet Commonly known as: PRAVACHOL Take 1 tablet (80 mg total) by mouth every evening.        Objective:   BP (!) 166/66   Pulse 70   Temp 97.6 F (36.4 C)   Ht 5' 2"  (1.575 m)   Wt 79.4 kg   SpO2 98%   BMI 32.01 kg/m   Wt Readings from Last 3 Encounters:  07/22/19 79.4 kg  05/12/19 81 kg  05/05/19 80.7 kg    Physical Exam   General: Patient is WDWN, appears stated age, and in no acute distress. Cardio: RRR, S1/S2 auscultated. Pulm: CTAB. Extremities: Hands = Arthritic changes noted in both hands. Decreased ROM and strength in right hand and wrist; she has some joint swelling of the right MCP of the thumb. Back: Pain on palpation of thoracic and lumbar spine; particularly  at the thoracolumbar curve.  This is paraspinal muscle tenderness palpation.  No midline tenderness palpation. Decreased ROM in the back.  Results for orders placed or performed in visit on 05/01/19  Hemoglobin, fingerstick  Result Value Ref Range   Hemoglobin 13.2 11.1 - 15.9 g/dL  CoaguChek XS/INR Waived  Result Value Ref Range   INR 1.3 (H) 0.9 - 1.1   Prothrombin Time 15.5 sec    Assessment & Plan:   Problem List Items Addressed This Visit    None      1. Osteoarthritis of Bilateral Hands, Bilateral Hips, and Back:  Prednisone 10 mg (21 tablet pack). Take as directed x 6 days. Since the patient is currently taking Xanax, Hydrocodone is contraindicated at this time. Due to the multiple joints involved and the reported past diagnosis of RA, would like to work up for RA with labs and possibly treat with Methotrexate or Humira (depending on lab results).  2. Nausea:  Zofran 4 mg Q 8 hours PRN. If nausea symptoms continue, will need to do a full GERD/GI work up.   Follow up plan: No follow-ups on file.   Gaynelle Arabian, PA-[facility] 07/22/2019, 5:02 PM   1. Polyarthralgia Concerning for possible autoimmune etiology.  I find it unusual that she would have been diagnosed with RA as an adolescent but never started on any medications for this.  I cannot find any previous autoimmune work-up in her chart but I have discussed with her that I will go ahead and reach out to her PCP to order these labs at a future date and time.  Would consider arthritis panel plus ANA, CRP, ESR.  I agree that opioids are not good long-term treatment for this patient, particularly if she has intermittent need for benzodiazepines.  We discussed this during today's visit.  Okay to utilize Tylenol if needed with the steroid Dosepak.  Would avoid NSAIDs given anticoagulation. - predniSONE (STERAPRED UNI-PAK 21 TAB) 10 MG (21) TBPK tablet; As directed x 6 days  Dispense: 21  tablet; Refill: 0  2. Nausea Uncertain etiology.  Possibly gastroparesis given diabetes.  Her diabetes is controlled so I have given her a dose of steroids as above to help with back pain. - ondansetron (ZOFRAN ODT) 4 MG disintegrating tablet; Take 1 tablet (4 mg total) by mouth every 8 (eight) hours as needed for nausea or vomiting.  Dispense: 20 tablet; Refill: 0  I have separately seen and examined the patient. I have discussed the findings and exam with Student PA Cheron Schaumann and agree with the above note.  My changes/additions are outlined in BLUE.  Ashly M. Lajuana Ripple, [facility]

## 2019-07-29 ENCOUNTER — Encounter: Payer: Self-pay | Admitting: Cardiology

## 2019-07-29 ENCOUNTER — Telehealth: Payer: Self-pay | Admitting: Cardiology

## 2019-07-29 ENCOUNTER — Telehealth (INDEPENDENT_AMBULATORY_CARE_PROVIDER_SITE_OTHER): Payer: Medicare Other | Admitting: Cardiology

## 2019-07-29 VITALS — BP 143/70 | HR 76 | Ht 62.0 in | Wt 172.0 lb

## 2019-07-29 DIAGNOSIS — Z79899 Other long term (current) drug therapy: Secondary | ICD-10-CM

## 2019-07-29 DIAGNOSIS — E785 Hyperlipidemia, unspecified: Secondary | ICD-10-CM

## 2019-07-29 DIAGNOSIS — I2510 Atherosclerotic heart disease of native coronary artery without angina pectoris: Secondary | ICD-10-CM

## 2019-07-29 DIAGNOSIS — I482 Chronic atrial fibrillation, unspecified: Secondary | ICD-10-CM | POA: Diagnosis not present

## 2019-07-29 DIAGNOSIS — I10 Essential (primary) hypertension: Secondary | ICD-10-CM

## 2019-07-29 NOTE — Addendum Note (Signed)
Addended by: Shellia Cleverly on: 07/29/2019 05:02 PM   Modules accepted: Orders

## 2019-07-29 NOTE — Progress Notes (Signed)
Virtual Visit via Telephone Note   This visit type was conducted due to national recommendations for restrictions regarding the COVID-19 Pandemic (e.g. social distancing) in an effort to limit this patient's exposure and mitigate transmission in our community.  Due to her co-morbid illnesses, this patient is at least at moderate risk for complications without adequate follow up.  This format is felt to be most appropriate for this patient at this time.  The patient did not have access to video technology/had technical difficulties with video requiring transitioning to audio format only (telephone).  All issues noted in this document were discussed and addressed.  No physical exam could be performed with this format.  Please refer to the patient's chart for her  consent to telehealth for [facility].   The patient was identified using 2 identifiers.  Date:  07/29/2019   ID:  Rhonda Garcia, DOB [birth year], MRN [id number]  Patient Location: Home Provider Location: Home  PCP:  Sharion Balloon, FNP  Cardiologist:  Minus Breeding, MD  Electrophysiologist:  None   Evaluation Performed:  Follow-Up Visit  Chief Complaint:  Atrial fib  History of Present Illness:    Rhonda Garcia is a 79 y.o. female with for evaluation of known coronary disease and atrial fibrillation.   Cath for chest pain in 2017 demonstrated nonobstructive disease in large vessels with an occluded PL off the RCA.  She had patent stents in the RI, RCA and LAD.  She was managed medically.  She was at [facility] ED in 2020 with chest pain. he ruled out for MI and she had a negative Lexiscan Myoview.       Since I last saw her she has been taking care of her son who had a stroke in November.  She constantly has severe COPD.  She has been under stress with doing relatively well.  She does not notice her atrial fibrillation.  She is not describing any chest pressure, neck or arm discomfort.  She is not having any new shortness of breath,  PND or orthopnea.  She had no palpitations, presyncope or syncope.  She had no weight gain or edema.  The patient does not have symptoms concerning for COVID-19 infection (fever, chills, cough, or new shortness of breath).    Past Medical History:  Diagnosis Date  . A-fib ([place])   . Anal fissure    resolved   . Anemia   . Back pain    with left radiculopathy  . CAD (coronary artery disease)   . Cataract   . Collagen vascular disease ([place])   . DDD (degenerative disc disease)   . Depression   . Diabetes mellitus   . Diverticulosis   . Dyslipidemia   . Dysrhythmia    AFib  . Esophagitis   . GERD (gastroesophageal reflux disease)   . Hiatal hernia   . Hx of peptic ulcer   . Hyperlipidemia   . Hypertension   . Hypothyroidism   . Internal hemorrhoids 2017  . NSTEMI (non-ST elevated myocardial infarction) ([place]) 08/05/2015  . Osteopenia   . Sleep apnea    not using CPAP now, could not tolerate and PCP aware.  . Thyroid disease    Past Surgical History:  Procedure Laterality Date  . ABDOMINAL HYSTERECTOMY Bilateral 2001 - approximate  . APPENDECTOMY    . BREAST REDUCTION SURGERY    . CARDIAC CATHETERIZATION N/A 08/08/2015   Procedure: Left Heart Cath and Coronary Angiography;  Surgeon: Burnell Blanks,  MD;  Location: [facility];  Service: Cardiovascular;  Laterality: N/A;  . CHOLECYSTECTOMY  2008 - approximate  . COLONOSCOPY  06/2007   Friable anal canal and anal papillae. Pancolonic diverticula. Normal terminal ileum. Status post segmental biopsy, descending colon biopsy showed minimal cryptitis. Biopsies felt to be  nonspecific but could be seen with NSAIDs. No features of inflammatory bowel disease. Stool studies were negative.  . COLONOSCOPY  03/21/2011   RMR: colonic polyps treated as described above, colonic diverticulosis (Tubular adenomas)  . COLONOSCOPY WITH PROPOFOL N/A 06/17/2014   RMR: Colonic diverticulosis. Multiple colonic polyps removed as described  above.   . COLONOSCOPY WITH PROPOFOL N/A 03/19/2016   Procedure: COLONOSCOPY WITH PROPOFOL;  Surgeon: Daneil Dolin, MD;  [facility];  Service: Endoscopy;  Laterality: N/A;  8:45am  . COLONOSCOPY WITH PROPOFOL N/A 10/02/2018   Procedure: COLONOSCOPY WITH PROPOFOL;  Surgeon: Daneil Dolin, MD;  [facility];  Service: Endoscopy;  Laterality: N/A;  9:15am  . CORONARY ANGIOPLASTY WITH STENT PLACEMENT     4 stents  . ESOPHAGEAL DILATION N/A 06/17/2014   Procedure: ESOPHAGEAL DILATION;  Surgeon: Daneil Dolin, MD;  Location: [facility];  Service: Endoscopy;  Laterality: N/A;  Maloney 58 Fr  . ESOPHAGOGASTRODUODENOSCOPY  06/2007   Small hiatal hernia  . ESOPHAGOGASTRODUODENOSCOPY  03/21/2011   RMR: normal esophagus- status post passage of Maloney dilator. Small hiatal hernia. Trivial antral and bulbar erosions  . ESOPHAGOGASTRODUODENOSCOPY (EGD) WITH PROPOFOL N/A 06/17/2014   RMR: Small hiatal hernia; otherwise normal EGD. Status post passage of a Maloney dilator. No explanation for patients right upper quadrant abdominal pain.   Marland Kitchen ESOPHAGOGASTRODUODENOSCOPY (EGD) WITH PROPOFOL N/A 03/19/2016   Procedure: ESOPHAGOGASTRODUODENOSCOPY (EGD) WITH PROPOFOL;  Surgeon: Daneil Dolin, MD;  [facility];  Service: Endoscopy;  Laterality: N/A;  . KNEE SURGERY Left    arthroscopy  . LIPOMA EXCISION  03/17/2012   Procedure: EXCISION LIPOMA;  Surgeon: Gwenyth Ober, MD;  Location: [facility];  Service: General;  Laterality: Right;  excision of right lower back lipoma  . MALONEY DILATION N/A 03/19/2016   Procedure: Venia Minks DILATION;  Surgeon: Daneil Dolin, MD;  [facility];  Service: Endoscopy;  Laterality: N/A;  . POLYPECTOMY  06/17/2014   Procedure: POLYPECTOMY;  Surgeon: Daneil Dolin, MD;  Location[facility];  Service: Endoscopy;;  . POLYPECTOMY  10/02/2018   Procedure: POLYPECTOMY;  Surgeon: Daneil Dolin, MD;  [facility];  Service:  Endoscopy;;  colon  . REDUCTION MAMMAPLASTY Bilateral      Current Meds  Medication Sig  . ALPRAZolam (XANAX) 0.5 MG tablet Take 1 tablet (0.5 mg total) by mouth at bedtime as needed.  Marland Kitchen apixaban (ELIQUIS) 5 MG TABS tablet Take 1 tablet (5 mg total) by mouth 2 (two) times daily.  Marland Kitchen escitalopram (LEXAPRO) 10 MG tablet Take 1 tablet (10 mg total) by mouth daily.  . isosorbide mononitrate (IMDUR) 60 MG 24 hr tablet TAKE ONE (1) TABLET EACH DAY  . levothyroxine (SYNTHROID) 50 MCG tablet TAKE ONE (1) TABLET EACH DAY  . lisinopril (ZESTRIL) 10 MG tablet Take 1 tablet (10 mg total) by mouth daily.  . metFORMIN (GLUCOPHAGE) 1000 MG tablet TAKE ONE TABLET TWICE A DAY WITH FOOD  . metoprolol tartrate (LOPRESSOR) 50 MG tablet TAKE ONE TABLET BY MOUTH TWICE DAILY  . nitroGLYCERIN (NITROSTAT) 0.4 MG SL tablet Place 1 tablet (0.4 mg total) under the tongue  every 5 (five) minutes as needed for chest pain.  Marland Kitchen ondansetron (ZOFRAN ODT) 4 MG disintegrating tablet Take 1 tablet (4 mg total) by mouth every 8 (eight) hours as needed for nausea or vomiting.  . pantoprazole (PROTONIX) 40 MG tablet Take 1 tablet (40 mg total) by mouth 2 (two) times daily.  . pravastatin (PRAVACHOL) 80 MG tablet Take 1 tablet (80 mg total) by mouth every evening.  . predniSONE (STERAPRED UNI-PAK 21 TAB) 10 MG (21) TBPK tablet As directed x 6 days     Allergies:   Promethazine hcl   Social History   Tobacco Use  . Smoking status: Never Smoker  . Smokeless tobacco: Never Used  Substance Use Topics  . Alcohol use: Not Currently    Alcohol/week: 1.0 standard drinks    Types: 1 Glasses of wine per week  . Drug use: No     Family Hx: The patient's family history includes Cancer in her sister; Clotting disorder in her brother; Coronary artery disease in an other family member; Coronary artery disease (age of onset: 90) in her son; Coronary artery disease (age of onset: 53) in her son; Diabetes in her brother, sister, sister, and  son; Heart attack in her mother; Heart disease in her brother; Hypertension in her mother, son, and son; Kidney disease in her brother and another family member; Melanoma in her brother; Stroke in her mother and sister. There is no history of Colon cancer.  ROS:   Please see the history of present illness.     All other systems reviewed and are negative.   Prior CV studies:   The following studies were reviewed today:  None  Labs/Other Tests and Data Reviewed:    EKG:  No ECG reviewed.  Recent Labs: 12/05/2018: ALT 13; BUN 26; Creatinine, Ser 1.25; Hemoglobin 11.7; Platelets 180; Potassium 4.7; Sodium 138; TSH 0.779   Recent Lipid Panel Lab Results  Component Value Date/Time   CHOL 164 07/07/2018 09:28 AM   CHOL 181 10/28/2012 12:08 PM   TRIG 91 07/07/2018 09:28 AM   TRIG 67 08/13/2013 11:54 AM   TRIG 97 10/28/2012 12:08 PM   HDL 37 (L) 07/07/2018 09:28 AM   HDL 43 08/13/2013 11:54 AM   HDL 50 10/28/2012 12:08 PM   CHOLHDL 4.4 07/07/2018 09:28 AM   CHOLHDL 3.8 08/06/2015 02:00 AM   LDLCALC 109 (H) 07/07/2018 09:28 AM   LDLCALC 58 08/13/2013 11:54 AM   LDLCALC 112 (H) 10/28/2012 12:08 PM    Wt Readings from Last 3 Encounters:  07/29/19 172 lb (78 kg)  07/22/19 175 lb (79.4 kg)  05/12/19 178 lb 9.6 oz (81 kg)     Objective:    Vital Signs:  BP (!) 143/70   Pulse 76   Ht 5\' 2"  (1.575 m)   Wt 172 lb (78 kg)   BMI 31.46 kg/m    VITAL SIGNS:  reviewed  ASSESSMENT & PLAN:     ATRIAL FIB  [name] has a CHA2DS2 - VASc score of 5.  She tolerates anticoagulation.  I do not see a recent CBC and so she will come to get this.   CAD, NATIVE VESSEL  She had a negative Lexiscan Myoview in May 2020.    She has had no new chest pain.  No new testing is needed.   HYPERTENSION, UNSPECIFIED The blood pressure is  slightly elevated but she says this is unusual.  No change in therapy.   HYPERLIPIDEMIA LDL  is mildly elevated previously.  She did not tolerate  Lipitor but she told me previously she might consider Crestor.  I will have her come back for a lipid profile.   Time:   Today, I have spent 16 minutes with the patient with telehealth technology discussing the above problems.     Medication Adjustments/Labs and Tests Ordered: Current medicines are reviewed at length with the patient today.  Concerns regarding medicines are outlined above.   Tests Ordered: No orders of the defined types were placed in this encounter.   Medication Changes: No orders of the defined types were placed in this encounter.   Follow Up:  In Person in one year.  Signed, Minus Breeding, MD  07/29/2019 4:49 PM     [facility]

## 2019-07-29 NOTE — Telephone Encounter (Signed)
  Patient Consent for Virtual Visit         Rhonda HIBBERT has provided verbal consent on 07/29/2019 for a virtual visit (video or telephone).   CONSENT FOR VIRTUAL VISIT FOR:  Rhonda Garcia  By participating in this virtual visit I agree to the following:  I hereby voluntarily request, consent and authorize [facility] and its employed or contracted physicians, physician assistants, nurse practitioners or other licensed health care professionals (the Practitioner), to provide me with telemedicine health care services (the "Services") as deemed necessary by the treating Practitioner. I acknowledge and consent to receive the Services by the Practitioner via telemedicine. I understand that the telemedicine visit will involve communicating with the Practitioner through live audiovisual communication technology and the disclosure of certain medical information by electronic transmission. I acknowledge that I have been given the opportunity to request an in-person assessment or other available alternative prior to the telemedicine visit and am voluntarily participating in the telemedicine visit.  I understand that I have the right to withhold or withdraw my consent to the use of telemedicine in the course of my care at any time, without affecting my right to future care or treatment, and that the Practitioner or I may terminate the telemedicine visit at any time. I understand that I have the right to inspect all information obtained and/or recorded in the course of the telemedicine visit and may receive copies of available information for a reasonable fee.  I understand that some of the potential risks of receiving the Services via telemedicine include:  Marland Kitchen Delay or interruption in medical evaluation due to technological equipment failure or disruption; . Information transmitted may not be sufficient (e.g. poor resolution of images) to allow for appropriate medical decision making by the Practitioner;  and/or  . In rare instances, security protocols could fail, causing a breach of personal health information.  Furthermore, I acknowledge that it is my responsibility to provide information about my medical history, conditions and care that is complete and accurate to the best of my ability. I acknowledge that Practitioner's advice, recommendations, and/or decision may be based on factors not within their control, such as incomplete or inaccurate data provided by me or distortions of diagnostic images or specimens that may result from electronic transmissions. I understand that the practice of medicine is not an exact science and that Practitioner makes no warranties or guarantees regarding treatment outcomes. I acknowledge that a copy of this consent can be made available to me via my patient portal ([facility]), or I can request a printed copy by calling the office of [facility].    I understand that my insurance will be billed for this visit.   I have read or had this consent read to me. . I understand the contents of this consent, which adequately explains the benefits and risks of the Services being provided via telemedicine.  . I have been provided ample opportunity to ask questions regarding this consent and the Services and have had my questions answered to my satisfaction. . I give my informed consent for the services to be provided through the use of telemedicine in my medical care

## 2019-07-29 NOTE — Patient Instructions (Addendum)
Medication Instructions:  The current medical regimen is effective;  continue present plan and medications.  *If you need a refill on your cardiac medications before your next appointment, please call your pharmacy*  Lab Work: Please have fasting blood work at your primary care doctor's office.  (Lipid, CBC)  The orders are in the computer for the lab work you need for Dr Percival Spanish. If you have labs (blood work) drawn today and your tests are completely normal, you will receive your results only by: Marland Kitchen MyChart Message (if you have MyChart) OR . A paper copy in the mail If you have any lab test that is abnormal or we need to change your treatment, we will call you to review the results.  Follow-Up: At [facility], you and your health needs are our priority.  As part of our continuing mission to provide you with exceptional heart care, we have created designated Provider Care Teams.  These Care Teams include your primary Cardiologist (physician) and Advanced Practice Providers (APPs -  Physician Assistants and Nurse Practitioners) who all work together to provide you with the care you need, when you need it.  We recommend signing up for the patient portal called "MyChart".  Sign up information is provided on this After Visit Summary.  MyChart is used to connect with patients for Virtual Visits (Telemedicine).  Patients are able to view lab/test results, encounter notes, upcoming appointments, etc.  Non-urgent messages can be sent to your provider as well.   To learn more about what you can do with MyChart, go to [email or web address].    Your next appointment:   12 month(s)  The format for your next appointment:   In Person  Provider:   Minus Breeding, MD   Thank you for choosing [facility]!!

## 2019-08-04 ENCOUNTER — Ambulatory Visit: Payer: Medicare Other | Attending: Internal Medicine

## 2019-08-04 DIAGNOSIS — Z23 Encounter for immunization: Secondary | ICD-10-CM

## 2019-08-04 NOTE — Progress Notes (Signed)
   Covid-19 Vaccination Clinic  Name:  Rhonda Garcia    MRN: [id number] DOB: 04-Feb-1941  08/04/2019  [name] was observed post Covid-19 immunization for 15 minutes without incident. She was provided with Vaccine Information Sheet and instruction to access the V-Safe system.   [name] was instructed to call 911 with any severe reactions post vaccine: Marland Kitchen Difficulty breathing  . Swelling of face and throat  . A fast heartbeat  . A bad rash all over body  . Dizziness and weakness   Immunizations Administered    Name Date Dose VIS Date Route   Moderna COVID-19 Vaccine 08/04/2019  9:18 AM 0.5 mL 03/2019 Intramuscular   Manufacturer: Moderna   Lot: [id number]   [id number]

## 2019-08-10 ENCOUNTER — Ambulatory Visit: Payer: Medicare Other | Admitting: Family

## 2019-08-13 ENCOUNTER — Other Ambulatory Visit: Payer: Self-pay | Admitting: Nurse Practitioner

## 2019-08-13 DIAGNOSIS — K5900 Constipation, unspecified: Secondary | ICD-10-CM

## 2019-10-14 ENCOUNTER — Ambulatory Visit (INDEPENDENT_AMBULATORY_CARE_PROVIDER_SITE_OTHER): Payer: Medicare Other | Admitting: Physician Assistant

## 2019-10-14 ENCOUNTER — Other Ambulatory Visit: Payer: Self-pay

## 2019-10-14 ENCOUNTER — Other Ambulatory Visit: Payer: Self-pay | Admitting: Family

## 2019-10-14 ENCOUNTER — Encounter: Payer: Self-pay | Admitting: Physician Assistant

## 2019-10-14 VITALS — BP 117/61 | HR 70 | Temp 96.7°F | Resp 20 | Ht 62.0 in | Wt 166.0 lb

## 2019-10-14 DIAGNOSIS — R1032 Left lower quadrant pain: Secondary | ICD-10-CM

## 2019-10-14 DIAGNOSIS — R300 Dysuria: Secondary | ICD-10-CM | POA: Diagnosis not present

## 2019-10-14 DIAGNOSIS — E039 Hypothyroidism, unspecified: Secondary | ICD-10-CM

## 2019-10-14 LAB — URINALYSIS, COMPLETE
Bilirubin, UA: NEGATIVE
Glucose, UA: NEGATIVE
Ketones, UA: NEGATIVE
Leukocytes,UA: NEGATIVE
Nitrite, UA: NEGATIVE
Protein,UA: NEGATIVE
RBC, UA: NEGATIVE
Specific Gravity, UA: 1.015 (ref 1.005–1.030)
Urobilinogen, Ur: 1 mg/dL (ref 0.2–1.0)
pH, UA: 5 (ref 5.0–7.5)

## 2019-10-14 LAB — MICROSCOPIC EXAMINATION
RBC, Urine: NONE SEEN /hpf (ref 0–2)
Renal Epithel, UA: NONE SEEN /hpf

## 2019-10-14 MED ORDER — CIPROFLOXACIN HCL 500 MG PO TABS: 500.0000 mg | ORAL_TABLET | Freq: Two times a day (BID) | ORAL | 0 refills | Status: DC

## 2019-10-14 NOTE — Patient Instructions (Signed)

## 2019-10-14 NOTE — Progress Notes (Signed)
  Subjective:     Patient ID: Rhonda Garcia, female   DOB: 21-Apr-1940, 79 y.o.   MRN: [id number]  HPI Pt with LLQ abd pain and freq urination of scant amt She denies any fever/chills Some sl nausea but no vomiting Denies hematuria/dysuria Hx of UTI and diverticulitis Pt has not been testing BS at home Thinks some of sx may be due to stress as well Taking care of son in her home due to him having CVA Also lost husband of 3 years ~ 6 weeks ago  Review of Systems  Constitutional: Positive for appetite change. Negative for activity change, chills, diaphoresis, fatigue, fever and unexpected weight change.  Respiratory: Negative.   Cardiovascular: Negative.   Gastrointestinal: Positive for abdominal pain and nausea. Negative for abdominal distention, blood in stool, constipation, diarrhea and vomiting.  Genitourinary: Positive for decreased urine volume and frequency. Negative for difficulty urinating, dysuria, enuresis, flank pain, hematuria, pelvic pain and urgency.       Objective:   Physical Exam Vitals and nursing note reviewed.  Constitutional:      General: She is not in acute distress.    Appearance: She is well-developed. She is not ill-appearing or toxic-appearing.  Abdominal:     General: Bowel sounds are normal. There is no distension.     Palpations: Abdomen is soft.     Tenderness: There is abdominal tenderness in the suprapubic area and left lower quadrant. There is no right CVA tenderness, left CVA tenderness, guarding or rebound.     Hernia: No hernia is present.     Comments: Generalized lower abd/suprapubic TTP greater over LLQ  Neurological:     Mental Status: She is alert.   UA - see labs     Assessment:     1. Dysuria   2. LLQ abdominal pain        Plan:     Due to sx and hx treated with Cipro bid x 10 days Discussed this would cover urine and possible diverticulitis F/U in 1 week with regular provider given stress and current sx Encouraged her to  test her BS at home F/U sooner if any problems

## 2019-10-22 ENCOUNTER — Other Ambulatory Visit: Payer: Self-pay

## 2019-10-22 ENCOUNTER — Encounter: Payer: Self-pay | Admitting: Family

## 2019-10-22 ENCOUNTER — Ambulatory Visit (INDEPENDENT_AMBULATORY_CARE_PROVIDER_SITE_OTHER): Payer: Medicare Other | Admitting: Family

## 2019-10-22 VITALS — BP 118/56 | HR 75 | Temp 97.2°F | Ht 62.0 in | Wt 165.0 lb

## 2019-10-22 DIAGNOSIS — I10 Essential (primary) hypertension: Secondary | ICD-10-CM

## 2019-10-22 DIAGNOSIS — I252 Old myocardial infarction: Secondary | ICD-10-CM

## 2019-10-22 DIAGNOSIS — F1320 Sedative, hypnotic or anxiolytic dependence, uncomplicated: Secondary | ICD-10-CM

## 2019-10-22 DIAGNOSIS — R300 Dysuria: Secondary | ICD-10-CM

## 2019-10-22 DIAGNOSIS — F331 Major depressive disorder, recurrent, moderate: Secondary | ICD-10-CM

## 2019-10-22 DIAGNOSIS — I480 Paroxysmal atrial fibrillation: Secondary | ICD-10-CM

## 2019-10-22 DIAGNOSIS — K5900 Constipation, unspecified: Secondary | ICD-10-CM

## 2019-10-22 DIAGNOSIS — E1169 Type 2 diabetes mellitus with other specified complication: Secondary | ICD-10-CM

## 2019-10-22 DIAGNOSIS — E1165 Type 2 diabetes mellitus with hyperglycemia: Secondary | ICD-10-CM | POA: Diagnosis not present

## 2019-10-22 DIAGNOSIS — E785 Hyperlipidemia, unspecified: Secondary | ICD-10-CM

## 2019-10-22 DIAGNOSIS — E6609 Other obesity due to excess calories: Secondary | ICD-10-CM

## 2019-10-22 DIAGNOSIS — F411 Generalized anxiety disorder: Secondary | ICD-10-CM | POA: Diagnosis not present

## 2019-10-22 DIAGNOSIS — I152 Hypertension secondary to endocrine disorders: Secondary | ICD-10-CM

## 2019-10-22 DIAGNOSIS — Z6830 Body mass index (BMI) 30.0-30.9, adult: Secondary | ICD-10-CM

## 2019-10-22 DIAGNOSIS — E039 Hypothyroidism, unspecified: Secondary | ICD-10-CM

## 2019-10-22 DIAGNOSIS — K219 Gastro-esophageal reflux disease without esophagitis: Secondary | ICD-10-CM

## 2019-10-22 DIAGNOSIS — E1159 Type 2 diabetes mellitus with other circulatory complications: Secondary | ICD-10-CM

## 2019-10-22 DIAGNOSIS — M1990 Unspecified osteoarthritis, unspecified site: Secondary | ICD-10-CM

## 2019-10-22 DIAGNOSIS — Z79899 Other long term (current) drug therapy: Secondary | ICD-10-CM

## 2019-10-22 DIAGNOSIS — I25110 Atherosclerotic heart disease of native coronary artery with unstable angina pectoris: Secondary | ICD-10-CM

## 2019-10-22 LAB — URINALYSIS
Bilirubin, UA: NEGATIVE
Glucose, UA: NEGATIVE
Ketones, UA: NEGATIVE
Leukocytes,UA: NEGATIVE
Nitrite, UA: NEGATIVE
Protein,UA: NEGATIVE
Specific Gravity, UA: >1.03 — ABNORMAL HIGH (ref 1.005–1.030)
Urobilinogen, Ur: 0.2 mg/dL (ref 0.2–1.0)
pH, UA: 5 (ref 5.0–7.5)

## 2019-10-22 LAB — BAYER DCA HB A1C WAIVED: HB A1C (BAYER DCA - WAIVED): 7.1 % — ABNORMAL HIGH (ref <7.0)

## 2019-10-22 MED ORDER — LINACLOTIDE 72 MCG PO CAPS: 72.0000 ug | ORAL_CAPSULE | Freq: Every day | ORAL | 2 refills | Status: DC

## 2019-10-22 MED ORDER — ALPRAZOLAM 0.5 MG PO TABS: 0.5000 mg | ORAL_TABLET | Freq: Every evening | ORAL | 4 refills | Status: DC | PRN

## 2019-10-22 MED ORDER — LISINOPRIL 5 MG PO TABS: 5.0000 mg | ORAL_TABLET | Freq: Every day | ORAL | 1 refills | Status: DC

## 2019-10-22 MED ORDER — BUSPIRONE HCL 5 MG PO TABS: 5.0000 mg | ORAL_TABLET | Freq: Three times a day (TID) | ORAL | 1 refills | Status: DC

## 2019-10-22 NOTE — Patient Instructions (Signed)

## 2019-10-22 NOTE — Progress Notes (Signed)
Subjective:    Patient ID: Rhonda Garcia, female    DOB: 12-Jul-1940, 79 y.o.   MRN: [id number]  Chief Complaint  Patient presents with  . Medical Management of Chronic Issues  . Diabetes  . Hypertension  . Urinary Urgency   PT presents to the office today for chronic follow up. She is followed by Cardiologists every 6 months for A Fib and CAD. She is followed by GI every 6 months for constipation, GERD.   She reports her husband died in Sep 16, 2022 and is still caring her her son.  Diabetes She presents for her follow-up diabetic visit. She has type 2 diabetes mellitus. Her disease course has been stable. Hypoglycemia symptoms include nervousness/anxiousness. Associated symptoms include fatigue. Pertinent negatives for diabetes include no blurred vision and no foot paresthesias. There are no hypoglycemic complications. Symptoms are stable. Pertinent negatives for diabetic complications include no CVA, heart disease, nephropathy or peripheral neuropathy. Risk factors for coronary artery disease include dyslipidemia, diabetes mellitus, hypertension, sedentary lifestyle and post-menopausal. She is following a diabetic diet. (Does not check BS at home ) An ACE inhibitor/angiotensin II receptor blocker is being taken. Eye exam is current.  Hypertension This is a chronic problem. The current episode started more than 1 year ago. The problem has been resolved since onset. The problem is controlled. Associated symptoms include anxiety, malaise/fatigue and shortness of breath ("some times"). Pertinent negatives include no blurred vision or peripheral edema. Risk factors for coronary artery disease include dyslipidemia, obesity and sedentary lifestyle. The current treatment provides moderate improvement. There is no history of CVA. Identifiable causes of hypertension include a thyroid problem.  Thyroid Problem Presents for follow-up visit. Symptoms include anxiety, constipation, depressed mood and fatigue. The  symptoms have been stable. Her past medical history is significant for hyperlipidemia.  Constipation This is a chronic problem. The current episode started more than 1 year ago. The problem has been resolved since onset. Her stool frequency is 2 to 3 times per week. She has tried laxatives for the symptoms. The treatment provided moderate relief.  Gastroesophageal Reflux She complains of belching and heartburn. This is a chronic problem. The current episode started more than 1 year ago. The problem occurs occasionally. The problem has been waxing and waning. Associated symptoms include fatigue. Risk factors include obesity. She has tried a PPI for the symptoms. The treatment provided moderate relief.  Hyperlipidemia This is a chronic problem. The current episode started more than 1 year ago. The problem is controlled. Recent lipid tests were reviewed and are normal. Exacerbating diseases include obesity. Associated symptoms include shortness of breath ("some times"). Current antihyperlipidemic treatment includes statins. The current treatment provides moderate improvement of lipids. Risk factors for coronary artery disease include diabetes mellitus, dyslipidemia, hypertension and a sedentary lifestyle.  Anxiety Presents for follow-up visit. Symptoms include depressed mood, irritability, nervous/anxious behavior and shortness of breath ("some times"). Symptoms occur occasionally. The severity of symptoms is moderate. The quality of sleep is good.    Urinary Frequency  This is a new problem. The current episode started 1 to 4 weeks ago. The problem occurs intermittently. The problem has been waxing and waning. The pain is at a severity of 0/10. The patient is experiencing no pain. Associated symptoms include frequency and urgency. Pertinent negatives include no discharge, flank pain, hematuria or hesitancy. She has tried antibiotics (Started Cipro last week) for the symptoms. The treatment provided mild  relief.      Review of Systems  Constitutional:  Positive for fatigue, irritability and malaise/fatigue.  Eyes: Negative for blurred vision.  Respiratory: Positive for shortness of breath ("some times").   Gastrointestinal: Positive for constipation and heartburn.  Genitourinary: Positive for frequency and urgency. Negative for flank pain, hematuria and hesitancy.  Psychiatric/Behavioral: The patient is nervous/anxious.   All other systems reviewed and are negative.      Objective:   Physical Exam Vitals reviewed.  Constitutional:      General: She is not in acute distress.    Appearance: She is well-developed. She is obese.  HENT:     Head: Normocephalic and atraumatic.     Right Ear: Tympanic membrane normal.     Left Ear: Tympanic membrane normal.  Eyes:     Pupils: Pupils are equal, round, and reactive to light.  Neck:     Thyroid: No thyromegaly.  Cardiovascular:     Rate and Rhythm: Normal rate and regular rhythm.     Heart sounds: Normal heart sounds. No murmur heard.   Pulmonary:     Effort: Pulmonary effort is normal. No respiratory distress.     Breath sounds: Normal breath sounds. No wheezing.  Abdominal:     General: Bowel sounds are normal. There is no distension.     Palpations: Abdomen is soft.     Tenderness: There is no abdominal tenderness.  Musculoskeletal:        General: No tenderness. Normal range of motion.     Cervical back: Normal range of motion and neck supple.  Skin:    General: Skin is warm and dry.  Neurological:     Mental Status: She is alert and oriented to person, place, and time.     Cranial Nerves: No cranial nerve deficit.     Deep Tendon Reflexes: Reflexes are normal and symmetric.  Psychiatric:        Behavior: Behavior normal.        Thought Content: Thought content normal.        Judgment: Judgment normal.       BP (!) 118/56   Pulse 75   Temp (!) 97.2 F (36.2 C) (Temporal)   Ht 5' 2"  (1.575 m)   Wt 165 lb (74.8  kg)   BMI 30.18 kg/m      Assessment & Plan:  Rhonda Garcia comes in today with chief complaint of Medical Management of Chronic Issues, Diabetes, Hypertension, and Urinary Urgency   Diagnosis and orders addressed:  1. Dysuria - Urinalysis, Complete - CMP14+EGFR - CBC with Differential/Platelet  2. Moderate episode of recurrent major depressive disorder ([place]) - ToxASSURE Select 13 (MW), Urine - CMP14+EGFR - CBC with Differential/Platelet  3. Type 2 diabetes mellitus with hyperglycemia, without long-term current use of insulin (HCC) - Bayer DCA Hb A1c Waived - CMP14+EGFR - CBC with Differential/Platelet - Microalbumin / creatinine urine ratio - lisinopril (ZESTRIL) 5 MG tablet; Take 1 tablet (5 mg total) by mouth daily.  Dispense: 90 tablet; Refill: 1  4. GAD (generalized anxiety disorder) - ALPRAZolam (XANAX) 0.5 MG tablet; Take 1 tablet (0.5 mg total) by mouth at bedtime as needed.  Dispense: 30 tablet; Refill: 4 - CMP14+EGFR - CBC with Differential/Platelet - busPIRone (BUSPAR) 5 MG tablet; Take 1 tablet (5 mg total) by mouth 3 (three) times daily.  Dispense: 90 tablet; Refill: 1  5. Coronary artery disease involving native coronary artery of native heart with unstable angina pectoris (HCC) - CMP14+EGFR - CBC with Differential/Platelet  6. Hypertension associated with diabetes ([place])  Will Decrease Lisinopril to 5 mg from 10 mg - CMP14+EGFR - CBC with Differential/Platelet  7. Paroxysmal atrial fibrillation (HCC) - CMP14+EGFR - CBC with Differential/Platelet  8. Gastroesophageal reflux disease without esophagitis - CMP14+EGFR - CBC with Differential/Platelet  9. Hyperlipidemia associated with type 2 diabetes mellitus (HCC) - CMP14+EGFR - CBC with Differential/Platelet - Lipid panel  10. Hypothyroidism, unspecified type - CMP14+EGFR - CBC with Differential/Platelet  11. Osteoarthritis, unspecified osteoarthritis type, unspecified site - CMP14+EGFR - CBC  with Differential/Platelet  12. Constipation, unspecified constipation type Will decrease Linzess to 72 mcg from 145 mcg, because she could not tolerate the 145 mcg daily Force fluids - CMP14+EGFR - CBC with Differential/Platelet - linaclotide (LINZESS) 72 MCG capsule; Take 1 capsule (72 mcg total) by mouth daily before breakfast.  Dispense: 90 capsule; Refill: 2  13. Hx of non-ST elevation myocardial infarction (NSTEMI) - CMP14+EGFR - CBC with Differential/Platelet  14. Class 1 obesity due to excess calories with serious comorbidity and body mass index (BMI) of 30.0 to 30.9 in adult - CMP14+EGFR - CBC with Differential/Platelet  15. Controlled substance agreement signed - ALPRAZolam (XANAX) 0.5 MG tablet; Take 1 tablet (0.5 mg total) by mouth at bedtime as needed.  Dispense: 30 tablet; Refill: 4  16. Benzodiazepine dependence (HCC) - ALPRAZolam (XANAX) 0.5 MG tablet; Take 1 tablet (0.5 mg total) by mouth at bedtime as needed.  Dispense: 30 tablet; Refill: 4  Patient reviewed in  controlled database, no flags noted. Contract and drug screen are up dated today. Will add Buspar 5 mg today and she will take this instead of xanax. She only takes xanax twice a week at this time.  Labs pending Health Maintenance reviewed Diet and exercise encouraged  Follow up plan: 1 month  To recheck GAD and HTN  Evelina Dun, FNP

## 2019-10-22 NOTE — Addendum Note (Signed)
Addended by: Brynda Peon F on: 10/22/2019 11:08 AM   Modules accepted: Orders

## 2019-10-23 LAB — LIPID PANEL
Chol/HDL Ratio: 2.8 ratio (ref 0.0–4.4)
Cholesterol, Total: 118 mg/dL (ref 100–199)
HDL: 42 mg/dL (ref >39)
LDL Chol Calc (NIH): 59 mg/dL (ref 0–99)
Triglycerides: 86 mg/dL (ref 0–149)
VLDL Cholesterol Cal: 17 mg/dL (ref 5–40)

## 2019-10-23 LAB — CMP14+EGFR
ALT: 12 IU/L (ref 0–32)
AST: 18 IU/L (ref 0–40)
Albumin/Globulin Ratio: 1.6 (ref 1.2–2.2)
Albumin: 4.1 g/dL (ref 3.7–4.7)
Alkaline Phosphatase: 100 IU/L (ref 48–121)
BUN/Creatinine Ratio: 15 (ref 12–28)
BUN: 18 mg/dL (ref 8–27)
Bilirubin Total: 0.8 mg/dL (ref 0.0–1.2)
CO2: 24 mmol/L (ref 20–29)
Calcium: 9.8 mg/dL (ref 8.7–10.3)
Chloride: 103 mmol/L (ref 96–106)
Creatinine, Ser: 1.23 mg/dL — ABNORMAL HIGH (ref 0.57–1.00)
GFR calc Af Amer: 49 mL/min/{1.73_m2} — ABNORMAL LOW (ref >59)
GFR calc non Af Amer: 42 mL/min/{1.73_m2} — ABNORMAL LOW (ref >59)
Globulin, Total: 2.6 g/dL (ref 1.5–4.5)
Glucose: 161 mg/dL — ABNORMAL HIGH (ref 65–99)
Potassium: 4.4 mmol/L (ref 3.5–5.2)
Sodium: 140 mmol/L (ref 134–144)
Total Protein: 6.7 g/dL (ref 6.0–8.5)

## 2019-10-23 LAB — CBC WITH DIFFERENTIAL/PLATELET
Basophils Absolute: 0.1 10*3/uL (ref 0.0–0.2)
Basos: 1 %
EOS (ABSOLUTE): 0.1 10*3/uL (ref 0.0–0.4)
Eos: 2 %
Hematocrit: 37.8 % (ref 34.0–46.6)
Hemoglobin: 11.9 g/dL (ref 11.1–15.9)
Immature Grans (Abs): 0 10*3/uL (ref 0.0–0.1)
Immature Granulocytes: 0 %
Lymphocytes Absolute: 1.2 10*3/uL (ref 0.7–3.1)
Lymphs: 26 %
MCH: 28.3 pg (ref 26.6–33.0)
MCHC: 31.5 g/dL (ref 31.5–35.7)
MCV: 90 fL (ref 79–97)
Monocytes Absolute: 0.5 10*3/uL (ref 0.1–0.9)
Monocytes: 11 %
Neutrophils Absolute: 2.8 10*3/uL (ref 1.4–7.0)
Neutrophils: 60 %
Platelets: 165 10*3/uL (ref 150–450)
RBC: 4.2 x10E6/uL (ref 3.77–5.28)
RDW: 14.5 % (ref 11.7–15.4)
WBC: 4.7 10*3/uL (ref 3.4–10.8)

## 2019-10-26 LAB — TOXASSURE SELECT 13 (MW), URINE

## 2019-11-03 ENCOUNTER — Ambulatory Visit: Payer: Medicare Other | Admitting: Nurse Practitioner

## 2019-11-23 ENCOUNTER — Other Ambulatory Visit: Payer: Self-pay | Admitting: Family

## 2019-11-24 ENCOUNTER — Ambulatory Visit: Payer: Medicare Other | Admitting: Family

## 2019-11-25 ENCOUNTER — Encounter: Payer: Self-pay | Admitting: Family

## 2019-11-26 ENCOUNTER — Encounter: Payer: Self-pay | Admitting: Family

## 2019-11-26 ENCOUNTER — Other Ambulatory Visit: Payer: Self-pay

## 2019-11-26 ENCOUNTER — Ambulatory Visit (INDEPENDENT_AMBULATORY_CARE_PROVIDER_SITE_OTHER): Payer: Medicare Other | Admitting: Family

## 2019-11-26 VITALS — BP 171/66 | HR 59 | Temp 97.5°F | Ht 62.0 in | Wt 172.0 lb

## 2019-11-26 DIAGNOSIS — I10 Essential (primary) hypertension: Secondary | ICD-10-CM | POA: Diagnosis not present

## 2019-11-26 DIAGNOSIS — E1159 Type 2 diabetes mellitus with other circulatory complications: Secondary | ICD-10-CM | POA: Diagnosis not present

## 2019-11-26 DIAGNOSIS — F411 Generalized anxiety disorder: Secondary | ICD-10-CM

## 2019-11-26 DIAGNOSIS — F4321 Adjustment disorder with depressed mood: Secondary | ICD-10-CM | POA: Diagnosis not present

## 2019-11-26 DIAGNOSIS — I152 Hypertension secondary to endocrine disorders: Secondary | ICD-10-CM

## 2019-11-26 MED ORDER — LISINOPRIL 10 MG PO TABS: 10.0000 mg | ORAL_TABLET | Freq: Every day | ORAL | 3 refills | Status: DC

## 2019-11-26 NOTE — Progress Notes (Signed)
Subjective:    Patient ID: Rhonda Garcia, female    DOB: [birth year], 79 y.o.   MRN: [id number]  Chief Complaint  Patient presents with  . Hypertension    1 month rck  . Anxiety   PT presents to the office today to recheck HTN and anxiety. Her BP is elevated today. Her son that she was caring for passed away on 11/25/19. She lost her husband in May.  Hypertension This is a chronic problem. The current episode started more than 1 year ago. The problem has been waxing and waning since onset. The problem is uncontrolled. Associated symptoms include anxiety and malaise/fatigue. Pertinent negatives include no peripheral edema or shortness of breath. Risk factors for coronary artery disease include dyslipidemia, diabetes mellitus, obesity and sedentary lifestyle. Past treatments include ACE inhibitors. The current treatment provides moderate improvement. Hypertensive end-organ damage includes kidney disease. There is no history of CVA or heart failure.  Anxiety Presents for follow-up visit. Symptoms include depressed mood, excessive worry, irritability, nervous/anxious behavior and restlessness. Patient reports no shortness of breath. Symptoms occur occasionally. The severity of symptoms is moderate. The quality of sleep is good.        Review of Systems  Constitutional: Positive for irritability and malaise/fatigue.  Respiratory: Negative for shortness of breath.   Psychiatric/Behavioral: The patient is nervous/anxious.   All other systems reviewed and are negative.      Objective:   Physical Exam Vitals reviewed.  Constitutional:      General: She is not in acute distress.    Appearance: She is well-developed. She is obese.  HENT:     Head: Normocephalic and atraumatic.     Right Ear: Tympanic membrane normal.     Left Ear: Tympanic membrane normal.  Eyes:     Pupils: Pupils are equal, round, and reactive to light.  Neck:     Thyroid: No thyromegaly.  Cardiovascular:     Rate  and Rhythm: Normal rate. Rhythm irregular.     Heart sounds: Normal heart sounds. No murmur heard.   Pulmonary:     Effort: Pulmonary effort is normal. No respiratory distress.     Breath sounds: Normal breath sounds. No wheezing.  Abdominal:     General: Bowel sounds are normal. There is no distension.     Palpations: Abdomen is soft.     Tenderness: There is no abdominal tenderness.  Musculoskeletal:        General: No tenderness. Normal range of motion.     Cervical back: Normal range of motion and neck supple.  Skin:    General: Skin is warm and dry.  Neurological:     Mental Status: She is alert and oriented to person, place, and time.     Cranial Nerves: No cranial nerve deficit.     Deep Tendon Reflexes: Reflexes are normal and symmetric.  Psychiatric:        Mood and Affect: Mood is anxious.        Behavior: Behavior normal.        Thought Content: Thought content normal.        Judgment: Judgment normal.       BP (!) 171/66   Pulse (!) 59   Temp (!) 97.5 F (36.4 C) (Temporal)   Ht 5\' 2"  (1.575 m)   Wt 172 lb (78 kg)   SpO2 97%   BMI 31.46 kg/m      Assessment & Plan:  Rhonda Garcia comes in today  with chief complaint of Hypertension (1 month rck) and Anxiety   Diagnosis and orders addressed:  1. Hypertension associated with diabetes ([place]) Will increase lisinopril to 10 from 5 mg  -Daily blood pressure log given with instructions on how to fill out and told to bring to next visit -Dash diet information given -Exercise encouraged - Stress Management  -Continue current meds -RTO in 3 months  - lisinopril (ZESTRIL) 10 MG tablet; Take 1 tablet (10 mg total) by mouth daily.  Dispense: 90 tablet; Refill: 3  2. GAD (generalized anxiety disorder) Continue Buspar 5 mg TID and xanax as needed  3. Grief   Health Maintenance reviewed Diet and exercise encouraged  Follow up plan: 3 months    Evelina Dun, FNP

## 2019-11-26 NOTE — Patient Instructions (Signed)

## 2019-11-30 ENCOUNTER — Ambulatory Visit: Payer: Medicare Other | Admitting: Family

## 2019-12-03 ENCOUNTER — Other Ambulatory Visit: Payer: Self-pay

## 2019-12-03 ENCOUNTER — Encounter: Payer: Self-pay | Admitting: Family

## 2019-12-03 ENCOUNTER — Ambulatory Visit (INDEPENDENT_AMBULATORY_CARE_PROVIDER_SITE_OTHER): Payer: Medicare Other | Admitting: Family

## 2019-12-03 VITALS — BP 128/65 | HR 62 | Temp 97.3°F | Ht 62.0 in | Wt 168.4 lb

## 2019-12-03 DIAGNOSIS — M62838 Other muscle spasm: Secondary | ICD-10-CM

## 2019-12-03 DIAGNOSIS — G44209 Tension-type headache, unspecified, not intractable: Secondary | ICD-10-CM

## 2019-12-03 DIAGNOSIS — E1169 Type 2 diabetes mellitus with other specified complication: Secondary | ICD-10-CM

## 2019-12-03 MED ORDER — BACLOFEN 10 MG PO TABS: 10.0000 mg | ORAL_TABLET | Freq: Three times a day (TID) | ORAL | 0 refills | Status: DC

## 2019-12-03 MED ORDER — BLOOD GLUCOSE METER KIT: PACK | 0 refills | Status: DC

## 2019-12-03 MED ORDER — METHYLPREDNISOLONE ACETATE 80 MG/ML IJ SUSP
80.0000 mg | Freq: Once | INTRAMUSCULAR | Status: AC
  Administered 2019-12-03: 80 mg via INTRAMUSCULAR

## 2019-12-03 NOTE — Patient Instructions (Signed)
Tension Headache, Adult A tension headache is a feeling of pain, pressure, or aching in the head that is often felt over the front and sides of the head. The pain can be dull, or it can feel tight (constricting). There are two types of tension headache:  Episodic tension headache. This is when the headaches happen fewer than 15 days a month.  Chronic tension headache. This is when the headaches happen more than 15 days a month during a 3-month period. A tension headache can last from 30 minutes to several days. It is the most common kind of headache. Tension headaches are not normally associated with nausea or vomiting, and they do not get worse with physical activity. What are the causes? The exact cause of this condition is not known. Tension headaches are often triggered by stress, anxiety, or depression. Other triggers include:  Alcohol.  Too much caffeine or caffeine withdrawal.  Respiratory infections, such as colds, flu, or sinus infections.  Dental problems or teeth clenching.  Tiredness (fatigue).  Holding your head and neck in the same position for a long period of time, such as while using a computer.  Smoking.  Arthritis of the neck. What are the signs or symptoms? Symptoms of this condition include:  A feeling of pressure or tightness around the head.  Dull, aching head pain.  Pain over the front and sides of the head.  Tenderness in the muscles of the head, neck, and shoulders. How is this diagnosed? This condition may be diagnosed based on your symptoms, your medical history, and a physical exam. If your symptoms are severe or unusual, you may have imaging tests, such as a CT scan or an MRI of your head. Your vision may also be checked. How is this treated? This condition may be treated with lifestyle changes and with medicines that help relieve symptoms. Follow these instructions at home: Managing pain  Take over-the-counter and prescription medicines only as  told by your health care provider.  When you have a headache, lie down in a dark, quiet room.  If directed, apply ice to the head and neck: ? Put ice in a plastic bag. ? Place a towel between your skin and the bag. ? Leave the ice on for 20 minutes, 2-3 times a day.  If directed, apply heat to the back of your neck as often as told by your health care provider. Use the heat source that your health care provider recommends, such as a moist heat pack or a heating pad. ? Place a towel between your skin and the heat source. ? Leave the heat on for 20-30 minutes. ? Remove the heat if your skin turns bright red. This is especially important if you are unable to feel pain, heat, or cold. You may have a greater risk of getting burned. Eating and drinking  Eat meals on a regular schedule.  Limit alcohol intake to no more than 1 drink a day for nonpregnant women and 2 drinks a day for men. One drink equals 12 oz of beer, 5 oz of wine, or 1 oz of hard liquor.  Drink enough fluid to keep your urine pale yellow.  Decrease your caffeine intake, or stop using caffeine. Lifestyle  Get 7-9 hours of sleep each night, or get the amount of sleep recommended by your health care provider.  At bedtime, remove all electronic devices from your room. Electronic devices include computers, phones, and tablets.  Find ways to manage your stress. Some things   that can help relieve stress include: ? Exercise. ? Deep breathing exercises. ? Yoga. ? Listening to music. ? Positive mental imagery.  Try to sit up straight and avoid tensing your muscles.  Do not use any products that contain nicotine or tobacco, such as cigarettes and e-cigarettes. If you need help quitting, ask your health care provider. General instructions   Keep all follow-up visits as told by your health care provider. This is important.  Avoid any headache triggers. Keep a headache journal to help find out what may trigger your headaches.  For example, write down: ? What you eat and drink. ? How much sleep you get. ? Any change to your diet or medicines. Contact a health care provider if:  Your headache does not get better.  Your headache comes back.  You are sensitive to sounds, light, or smells because of a headache.  You have nausea or you vomit.  Your stomach hurts. Get help right away if:  You suddenly develop a very severe headache along with any of the following: ? A stiff neck. ? Nausea and vomiting. ? Confusion. ? Weakness. ? Double vision or loss of vision. ? Shortness of breath. ? Rash. ? Unusual sleepiness. ? Fever. ? Trouble speaking. ? Pain in your eyes or ears. ? Trouble walking or balancing. ? Feeling faint or passing out. Summary  A tension headache is a feeling of pain, pressure, or aching in the head that is often felt over the front and sides of the head.  A tension headache can last from 30 minutes to several days. It is the most common kind of headache.  This condition may be diagnosed based on your symptoms, your medical history, and a physical exam.  This condition may be treated with lifestyle changes and with medicines that help relieve symptoms. This information is not intended to replace advice given to you by your health care provider. Make sure you discuss any questions you have with your health care provider. Document Revised: 01/21/2019 Document Reviewed: 07/06/2016 [employer] Patient Education  2020 [employer]  

## 2019-12-03 NOTE — Progress Notes (Signed)
Subjective:    Patient ID: Rhonda Garcia, female    DOB: 01/17/1941, 79 y.o.   MRN: [id number]   Chief Complaint  Patient presents with   Headache    patient states she is having nerve spasms in head pt states it will cause eyes to hurt x6 days. Pt states it is no triggers to it and in is random times it happens    Nausea   PT presents to the office today with spasm of right neck and scalp that started 6 days ago. It comes and goes last seconds. She reports the pain is a 9 out 10. She took zanaflex 2 mg without relief.   She reports an intermittent "tension headache". She has been under a great deal of stress as her husband and son died over the last 6 months.   Denies vision, gait, speech changes.  Headache  This is a new problem. The current episode started in the past 7 days. The problem occurs intermittently. The problem has been unchanged. The pain is located in the frontal and right unilateral region. The quality of the pain is described as sharp and shooting. The pain is moderate. Associated symptoms include nausea and sinus pressure. Pertinent negatives include no blurred vision, phonophobia, photophobia, visual change or vomiting. Nothing aggravates the symptoms. Treatments tried: tylenol. The treatment provided no relief.      Review of Systems  HENT: Positive for sinus pressure.   Eyes: Negative for blurred vision and photophobia.  Gastrointestinal: Positive for nausea. Negative for vomiting.  Neurological: Positive for headaches.  All other systems reviewed and are negative.      Objective:   Physical Exam Vitals reviewed.  Constitutional:      General: She is not in acute distress.    Appearance: She is well-developed. She is obese.  HENT:     Head: Normocephalic and atraumatic.     Right Ear: External ear normal.  Eyes:     Pupils: Pupils are equal, round, and reactive to light.  Neck:     Thyroid: No thyromegaly.  Cardiovascular:     Rate and Rhythm:  Normal rate and regular rhythm.     Heart sounds: Normal heart sounds. No murmur heard.   Pulmonary:     Effort: Pulmonary effort is normal. No respiratory distress.     Breath sounds: Normal breath sounds. No wheezing.  Abdominal:     General: Bowel sounds are normal. There is no distension.     Palpations: Abdomen is soft.     Tenderness: There is no abdominal tenderness.  Musculoskeletal:        General: No tenderness.     Cervical back: Normal range of motion and neck supple.     Comments: Pain in posterior neck and occipital with rotation  Skin:    General: Skin is warm and dry.  Neurological:     Mental Status: She is alert and oriented to person, place, and time.     Cranial Nerves: No cranial nerve deficit.     Deep Tendon Reflexes: Reflexes are normal and symmetric.  Psychiatric:        Behavior: Behavior normal.        Thought Content: Thought content normal.        Judgment: Judgment normal.      BP 128/65    Pulse 62    Temp (!) 97.3 F (36.3 C) (Temporal)    Ht 5' 2"  (1.575 m)    Wt  168 lb 6.4 oz (76.4 kg)    SpO2 97%    BMI 30.80 kg/m      Assessment & Plan:  Rhonda Garcia comes in today with chief complaint of Headache (patient states she is having nerve spasms in head pt states it will cause eyes to hurt x6 days. Pt states it is no triggers to it and in is random times it happens ) and Nausea   Diagnosis and orders addressed:  1. Neck muscle spasm - methylPREDNISolone acetate (DEPO-MEDROL) injection 80 mg - baclofen (LIORESAL) 10 MG tablet; Take 1 tablet (10 mg total) by mouth 3 (three) times daily.  Dispense: 30 each; Refill: 0  2. Tension headache - methylPREDNISolone acetate (DEPO-MEDROL) injection 80 mg - baclofen (LIORESAL) 10 MG tablet; Take 1 tablet (10 mg total) by mouth 3 (three) times daily.  Dispense: 30 each; Refill: 0  3. Type 2 diabetes mellitus with other specified complication, without long-term current use of insulin (HCC) - blood  glucose meter kit and supplies; Dispense based on patient and insurance preference. Use up to four times daily as directed. (FOR ICD-10 E10.9, E11.9).  Dispense: 1 each; Refill: 0   No neurological changes on exam. Will treat as tension headache. Stress management discussed. Strict low carb diet given steroid injection. If any changes in vision, gait, or speech go to ED.   Evelina Dun, FNP

## 2019-12-15 ENCOUNTER — Other Ambulatory Visit: Payer: Self-pay | Admitting: Family

## 2019-12-15 DIAGNOSIS — E1169 Type 2 diabetes mellitus with other specified complication: Secondary | ICD-10-CM

## 2019-12-15 DIAGNOSIS — E785 Hyperlipidemia, unspecified: Secondary | ICD-10-CM

## 2019-12-15 DIAGNOSIS — F411 Generalized anxiety disorder: Secondary | ICD-10-CM

## 2020-01-28 ENCOUNTER — Encounter: Payer: Self-pay | Admitting: Orthopaedic Surgery

## 2020-01-28 ENCOUNTER — Other Ambulatory Visit: Payer: Self-pay

## 2020-01-28 ENCOUNTER — Ambulatory Visit (INDEPENDENT_AMBULATORY_CARE_PROVIDER_SITE_OTHER): Payer: Medicare Other | Admitting: Orthopaedic Surgery

## 2020-01-28 ENCOUNTER — Ambulatory Visit: Payer: Self-pay

## 2020-01-28 VITALS — BP 163/83 | HR 66 | Ht 62.0 in | Wt 165.0 lb

## 2020-01-28 DIAGNOSIS — M25562 Pain in left knee: Secondary | ICD-10-CM

## 2020-01-28 DIAGNOSIS — M170 Bilateral primary osteoarthritis of knee: Secondary | ICD-10-CM | POA: Diagnosis not present

## 2020-01-28 MED ORDER — METHYLPREDNISOLONE ACETATE 40 MG/ML IJ SUSP
40.0000 mg | INTRAMUSCULAR | Status: AC | PRN
  Administered 2020-01-28: 40 mg via INTRA_ARTICULAR

## 2020-01-28 MED ORDER — LIDOCAINE HCL 1 % IJ SOLN
0.5000 mL | INTRAMUSCULAR | Status: AC | PRN
  Administered 2020-01-28: .5 mL

## 2020-01-28 MED ORDER — BUPIVACAINE HCL 0.5 % IJ SOLN
3.0000 mL | INTRAMUSCULAR | Status: AC | PRN
  Administered 2020-01-28: 3 mL via INTRA_ARTICULAR

## 2020-01-28 NOTE — Progress Notes (Signed)
Office Visit Note   Patient: Rhonda Garcia           Date of Birth: [birth year]           MRN: [id number] Visit Date: 01/28/2020              Requested by: Sharion Balloon, [address] [place],  [place] [address] PCP: Sharion Balloon, FNP   Assessment & Plan: Visit Diagnoses:  1. Acute pain of left knee   2. Bilateral primary osteoarthritis of knee     Plan: Left knee injection performed which she tolerated well with improvement in walking.  We reviewed x-rays.  We can check her back again in 2 weeks.  With her diabetes I discussed with her that I would not recommend injecting both knees at the same time.  Recheck 2 weeks.  She will watch her sugars carefully.  Follow-Up Instructions: No follow-ups on file.   Orders:  Orders Placed This Encounter  Procedures  . Large Joint Inj: L knee  . XR Knee 1-2 Views Left   No orders of the defined types were placed in this encounter.     Procedures: Large Joint Inj: L knee on 01/28/2020 10:41 AM Indications: joint swelling and pain Details: 22 G 1.5 in needle, anterolateral approach  Arthrogram: No  Medications: 0.5 mL lidocaine 1 %; 3 mL bupivacaine 0.5 %; 40 mg methylPREDNISolone acetate 40 MG/ML Outcome: tolerated well, no immediate complications Procedure, treatment alternatives, risks and benefits explained, specific risks discussed. Consent was given by the patient. Immediately prior to procedure a time out was called to verify the correct patient, procedure, equipment, support staff and site/side marked as required. Patient was prepped and draped in the usual sterile fashion.       Clinical Data: No additional findings.   Subjective: Chief Complaint  Patient presents with  . Left Knee - Pain    HPI 79 year old female states she has been working since she was 79 years old and has had problems with progressive knee pain 2 weeks ago she started having severe knee pain states she could not walk.  Right knee  has been bothering her little bit more shows more weight on the right states the right left knee is gotten a little bit better but is still painful.  Previous history of knee arthroscopy 8 years ago and was told she had some arthritis at that time.  She was given a knee sleeve prednisone.  Patient has diabetes with history of acid reflux heart disease hypertension and is on Eliquis.  She has had previous breast reduction surgery knee arthroscopies and gallbladder surgery without problems.  Review of Systems all other systems are noncontributory other than as mentioned above.   Objective: Vital Signs: BP (!) 163/83   Pulse 66   Ht 5\' 2"  (1.575 m)   Wt 165 lb (74.8 kg)   BMI 30.18 kg/m   Physical Exam Constitutional:      Appearance: She is well-developed.  HENT:     Head: Normocephalic.     Right Ear: External ear normal.     Left Ear: External ear normal.  Eyes:     Pupils: Pupils are equal, round, and reactive to light.  Neck:     Thyroid: No thyromegaly.     Trachea: No tracheal deviation.  Cardiovascular:     Rate and Rhythm: Normal rate.  Pulmonary:     Effort: Pulmonary effort is normal.  Abdominal:  Palpations: Abdomen is soft.  Skin:    General: Skin is warm and dry.  Neurological:     Mental Status: She is alert and oriented to person, place, and time.  Psychiatric:        Behavior: Behavior normal.     Ortho Exam bilateral knee crepitus.  No increased warmth.  Tenderness across the anterior joint line medial and lateral.  Collateral ligaments are stable distal pulse palpable negative logroll the hips. Specialty Comments:  No specialty comments available.  Imaging: No results found.   PMFS History: Patient Active Problem List   Diagnosis Date Noted  . Bilateral primary osteoarthritis of knee 01/28/2020  . Unintentional weight loss 05/05/2019  . Abdominal pain 01/01/2019  . Chest pain, rule out acute myocardial infarction 12/19/2018  . Diabetes  mellitus ([place]) 12/19/2018  . History of atrial fibrillation 12/19/2018  . Hx of non-ST elevation myocardial infarction (NSTEMI) 12/19/2018  . Anemia 07/22/2018  . Shortness of breath 01/09/2017  . Obese 11/05/2016  . Vertigo 06/27/2016  . GAD (generalized anxiety disorder) 10/27/2015  . Coronary artery disease involving native coronary artery of native heart with unstable angina pectoris ([place])   . Hx of adenomatous colonic polyps 06/22/2015  . Hypothyroidism 01/17/2015  . Vitamin D deficiency 09/09/2014  . Hiatal hernia   . Dysphagia, pharyngoesophageal phase   . History of colonic polyps   . Diverticulosis of colon without hemorrhage   . Paroxysmal atrial fibrillation ([place]) 11/02/2013  . Long term current use of anticoagulant therapy 11/02/2013  . Depression 10/28/2012  . Type 2 diabetes mellitus with hyperglycemia ([place]) 10/28/2012  . Lipoma of back 02/12/2012  . Constipation 02/28/2011  . Esophageal dysphagia 02/28/2011  . Gastroesophageal reflux 02/28/2011  . POSITIONAL VERTIGO 04/29/2009  . Osteoarthritis 12/23/2008  . Hyperlipidemia associated with type 2 diabetes mellitus ([place]) 12/21/2008  . Hypertension associated with diabetes ([place]) 12/21/2008   Past Medical History:  Diagnosis Date  . A-fib ([place])   . Anal fissure    resolved   . Anemia   . Back pain    with left radiculopathy  . CAD (coronary artery disease)   . Cataract   . Collagen vascular disease ([place])   . DDD (degenerative disc disease)   . Depression   . Diabetes mellitus   . Diverticulosis   . Dyslipidemia   . Dysrhythmia    AFib  . Esophagitis   . GERD (gastroesophageal reflux disease)   . Hiatal hernia   . Hx of peptic ulcer   . Hyperlipidemia   . Hypertension   . Hypothyroidism   . Internal hemorrhoids 2017  . NSTEMI (non-ST elevated myocardial infarction) ([place]) 08/05/2015  . Osteopenia   . Sleep apnea    not using CPAP now, could not tolerate and PCP aware.  . Thyroid disease     Family  History  Problem Relation Age of Onset  . Heart attack Mother   . Hypertension Mother   . Stroke Mother        Deceased, age 40  . Diabetes Sister   . Diabetes Sister   . Cancer Sister        ovarian  . Stroke Sister   . Diabetes Brother        also has a blood disorder   . Clotting disorder Brother   . Kidney disease Brother   . Heart disease Brother   . Melanoma Brother        deceased  . Coronary artery disease  Other   . Kidney disease Other   . Diabetes Son   . Hypertension Son   . Coronary artery disease Son 13       CABG  . Hypertension Son   . Coronary artery disease Son 1       CABG  . Colon cancer Neg Hx     Past Surgical History:  Procedure Laterality Date  . ABDOMINAL HYSTERECTOMY Bilateral 2001 - approximate  . APPENDECTOMY    . BREAST REDUCTION SURGERY    . CARDIAC CATHETERIZATION N/A 08/08/2015   Procedure: Left Heart Cath and Coronary Angiography;  Surgeon: Burnell Blanks, MD;  Location: [facility];  Service: Cardiovascular;  Laterality: N/A;  . CHOLECYSTECTOMY  2008 - approximate  . COLONOSCOPY  06/2007   Friable anal canal and anal papillae. Pancolonic diverticula. Normal terminal ileum. Status post segmental biopsy, descending colon biopsy showed minimal cryptitis. Biopsies felt to be  nonspecific but could be seen with NSAIDs. No features of inflammatory bowel disease. Stool studies were negative.  . COLONOSCOPY  03/21/2011   RMR: colonic polyps treated as described above, colonic diverticulosis (Tubular adenomas)  . COLONOSCOPY WITH PROPOFOL N/A 06/17/2014   RMR: Colonic diverticulosis. Multiple colonic polyps removed as described above.   . COLONOSCOPY WITH PROPOFOL N/A 03/19/2016   Procedure: COLONOSCOPY WITH PROPOFOL;  Surgeon: Daneil Dolin, MD;  [facility];  Service: Endoscopy;  Laterality: N/A;  8:45am  . COLONOSCOPY WITH PROPOFOL N/A 10/02/2018   Procedure: COLONOSCOPY WITH PROPOFOL;  Surgeon: Daneil Dolin, MD;   [facility];  Service: Endoscopy;  Laterality: N/A;  9:15am  . CORONARY ANGIOPLASTY WITH STENT PLACEMENT     4 stents  . ESOPHAGEAL DILATION N/A 06/17/2014   Procedure: ESOPHAGEAL DILATION;  Surgeon: Daneil Dolin, MD;  Location: [facility];  Service: Endoscopy;  Laterality: N/A;  Maloney 68 Fr  . ESOPHAGOGASTRODUODENOSCOPY  06/2007   Small hiatal hernia  . ESOPHAGOGASTRODUODENOSCOPY  03/21/2011   RMR: normal esophagus- status post passage of Maloney dilator. Small hiatal hernia. Trivial antral and bulbar erosions  . ESOPHAGOGASTRODUODENOSCOPY (EGD) WITH PROPOFOL N/A 06/17/2014   RMR: Small hiatal hernia; otherwise normal EGD. Status post passage of a Maloney dilator. No explanation for patients right upper quadrant abdominal pain.   Marland Kitchen ESOPHAGOGASTRODUODENOSCOPY (EGD) WITH PROPOFOL N/A 03/19/2016   Procedure: ESOPHAGOGASTRODUODENOSCOPY (EGD) WITH PROPOFOL;  Surgeon: Daneil Dolin, MD;  [facility];  Service: Endoscopy;  Laterality: N/A;  . KNEE SURGERY Left    arthroscopy  . LIPOMA EXCISION  03/17/2012   Procedure: EXCISION LIPOMA;  Surgeon: Gwenyth Ober, MD;  Location: [facility];  Service: General;  Laterality: Right;  excision of right lower back lipoma  . MALONEY DILATION N/A 03/19/2016   Procedure: Venia Minks DILATION;  Surgeon: Daneil Dolin, MD;  [facility];  Service: Endoscopy;  Laterality: N/A;  . POLYPECTOMY  06/17/2014   Procedure: POLYPECTOMY;  Surgeon: Daneil Dolin, MD;  [facility];  Service: Endoscopy;;  . POLYPECTOMY  10/02/2018   Procedure: POLYPECTOMY;  Surgeon: Daneil Dolin, MD;  [facility];  Service: Endoscopy;;  colon  . REDUCTION MAMMAPLASTY Bilateral    Social History   Occupational History  . Occupation: Retired    Comment: Charity fundraiser  Tobacco Use  . Smoking status: Never Smoker  . Smokeless tobacco: Never Used  Vaping Use  . Vaping Use: Never used  Substance and Sexual Activity  .  Alcohol use: Not Currently    Alcohol/week: 1.0 standard drink    Types: 1 Glasses of wine per week  . Drug use: No  . Sexual activity: Not Currently    Birth control/protection: Post-menopausal

## 2020-02-11 ENCOUNTER — Ambulatory Visit (INDEPENDENT_AMBULATORY_CARE_PROVIDER_SITE_OTHER): Payer: Medicare Other | Admitting: Orthopaedic Surgery

## 2020-02-11 ENCOUNTER — Encounter: Payer: Self-pay | Admitting: Orthopaedic Surgery

## 2020-02-11 ENCOUNTER — Other Ambulatory Visit: Payer: Self-pay

## 2020-02-11 VITALS — Ht 62.0 in | Wt 165.0 lb

## 2020-02-11 DIAGNOSIS — M170 Bilateral primary osteoarthritis of knee: Secondary | ICD-10-CM | POA: Diagnosis not present

## 2020-02-11 DIAGNOSIS — M25562 Pain in left knee: Secondary | ICD-10-CM

## 2020-02-11 NOTE — Progress Notes (Signed)
Office Visit Note   Patient: Rhonda Garcia           Date of Birth: 09-20-1940           MRN: [id number] Visit Date: 02/11/2020              Requested by: Sharion Balloon, [address] [place],  [place] [address] PCP: Sharion Balloon, FNP   Assessment & Plan: Visit Diagnoses:  1. Acute pain of left knee   2. Bilateral primary osteoarthritis of knee     Plan: Proceed with left knee MRI with persistent catching locking.  Office follow-up after scan for review.  Follow-Up Instructions: Return after MRI scan.  Orders:  Orders Placed This Encounter  Procedures  . MR Knee Left w/o contrast   No orders of the defined types were placed in this encounter.     Procedures: No procedures performed   Clinical Data: No additional findings.   Subjective: Chief Complaint  Patient presents with  . Right Knee - Follow-up, Pain  . Left Knee - Follow-up, Pain    HPI 79 year old female returns states the knee injection gave her some relief done on 01/28/2020 but the pain is returned.  Patient states she does not want knee arthroplasty but is wondering about knee arthroscopy.  Patient had return to consider possible right knee injection but states her right knee is actually doing better still her left knee which is buckling.  She is using Tylenol without relief.  She has had some repetitive catching and locking of her left knee but has been able to catch her self and has not actually fallen and hit the ground.  She does have a history of hypertension constipation type 2 diabetes history of atrial fib, coronary disease.  Patient is on Eliquis.  Review of Systems all other systems are negative other trhan as  obtained HPI   Objective: Vital Signs: Ht 5\' 2"  (1.575 m)   Wt 165 lb (74.8 kg)   BMI 30.18 kg/m   Physical Exam Constitutional:      Appearance: She is well-developed.  HENT:     Head: Normocephalic.     Right Ear: External ear normal.     Left Ear: External  ear normal.  Eyes:     Pupils: Pupils are equal, round, and reactive to light.  Neck:     Thyroid: No thyromegaly.     Trachea: No tracheal deviation.  Cardiovascular:     Rate and Rhythm: Normal rate.  Pulmonary:     Effort: Pulmonary effort is normal.  Abdominal:     Palpations: Abdomen is soft.  Skin:    General: Skin is warm and dry.  Neurological:     Mental Status: She is alert and oriented to person, place, and time.  Psychiatric:        Behavior: Behavior normal.     Ortho Exam patient has left knee crepitus.  Crepitus with right knee range of motion.  Medial more than lateral joint line tenderness.  Small palpable Baker's cyst.  ACL PCL exam is normal.  Pedal pulses are 2+ and intact.  Normal knee and ankle jerk.  Specialty Comments:  No specialty comments available.  Imaging: No results found.   PMFS History: Patient Active Problem List   Diagnosis Date Noted  . Bilateral primary osteoarthritis of knee 01/28/2020  . Unintentional weight loss 05/05/2019  . Abdominal pain 01/01/2019  . Chest pain, rule out acute myocardial infarction  12/19/2018  . Diabetes mellitus ([place]) 12/19/2018  . History of atrial fibrillation 12/19/2018  . Hx of non-ST elevation myocardial infarction (NSTEMI) 12/19/2018  . Anemia 07/22/2018  . Shortness of breath 01/09/2017  . Obese 11/05/2016  . Vertigo 06/27/2016  . GAD (generalized anxiety disorder) 10/27/2015  . Coronary artery disease involving native coronary artery of native heart with unstable angina pectoris ([place])   . Hx of adenomatous colonic polyps 06/22/2015  . Hypothyroidism 01/17/2015  . Vitamin D deficiency 09/09/2014  . Hiatal hernia   . Dysphagia, pharyngoesophageal phase   . History of colonic polyps   . Diverticulosis of colon without hemorrhage   . Paroxysmal atrial fibrillation ([place]) 11/02/2013  . Long term current use of anticoagulant therapy 11/02/2013  . Depression 10/28/2012  . Type 2 diabetes mellitus  with hyperglycemia ([place]) 10/28/2012  . Lipoma of back 02/12/2012  . Constipation 02/28/2011  . Esophageal dysphagia 02/28/2011  . Gastroesophageal reflux 02/28/2011  . POSITIONAL VERTIGO 04/29/2009  . Osteoarthritis 12/23/2008  . Hyperlipidemia associated with type 2 diabetes mellitus ([place]) 12/21/2008  . Hypertension associated with diabetes ([place]) 12/21/2008   Past Medical History:  Diagnosis Date  . A-fib ([place])   . Anal fissure    resolved   . Anemia   . Back pain    with left radiculopathy  . CAD (coronary artery disease)   . Cataract   . Collagen vascular disease ([place])   . DDD (degenerative disc disease)   . Depression   . Diabetes mellitus   . Diverticulosis   . Dyslipidemia   . Dysrhythmia    AFib  . Esophagitis   . GERD (gastroesophageal reflux disease)   . Hiatal hernia   . Hx of peptic ulcer   . Hyperlipidemia   . Hypertension   . Hypothyroidism   . Internal hemorrhoids 2017  . NSTEMI (non-ST elevated myocardial infarction) ([place]) 08/05/2015  . Osteopenia   . Sleep apnea    not using CPAP now, could not tolerate and PCP aware.  . Thyroid disease     Family History  Problem Relation Age of Onset  . Heart attack Mother   . Hypertension Mother   . Stroke Mother        Deceased, age 39  . Diabetes Sister   . Diabetes Sister   . Cancer Sister        ovarian  . Stroke Sister   . Diabetes Brother        also has a blood disorder   . Clotting disorder Brother   . Kidney disease Brother   . Heart disease Brother   . Melanoma Brother        deceased  . Coronary artery disease Other   . Kidney disease Other   . Diabetes Son   . Hypertension Son   . Coronary artery disease Son 33       CABG  . Hypertension Son   . Coronary artery disease Son 12       CABG  . Colon cancer Neg Hx     Past Surgical History:  Procedure Laterality Date  . ABDOMINAL HYSTERECTOMY Bilateral 2001 - approximate  . APPENDECTOMY    . BREAST REDUCTION SURGERY    . CARDIAC  CATHETERIZATION N/A 08/08/2015   Procedure: Left Heart Cath and Coronary Angiography;  Surgeon: Burnell Blanks, MD;  Location: [facility];  Service: Cardiovascular;  Laterality: N/A;  . CHOLECYSTECTOMY  2008 - approximate  . COLONOSCOPY  06/2007  Friable anal canal and anal papillae. Pancolonic diverticula. Normal terminal ileum. Status post segmental biopsy, descending colon biopsy showed minimal cryptitis. Biopsies felt to be  nonspecific but could be seen with NSAIDs. No features of inflammatory bowel disease. Stool studies were negative.  . COLONOSCOPY  03/21/2011   RMR: colonic polyps treated as described above, colonic diverticulosis (Tubular adenomas)  . COLONOSCOPY WITH PROPOFOL N/A 06/17/2014   RMR: Colonic diverticulosis. Multiple colonic polyps removed as described above.   . COLONOSCOPY WITH PROPOFOL N/A 03/19/2016   Procedure: COLONOSCOPY WITH PROPOFOL;  Surgeon: Daneil Dolin, MD;  [facility];  Service: Endoscopy;  Laterality: N/A;  8:45am  . COLONOSCOPY WITH PROPOFOL N/A 10/02/2018   Procedure: COLONOSCOPY WITH PROPOFOL;  Surgeon: Daneil Dolin, MD;  [facility];  Service: Endoscopy;  Laterality: N/A;  9:15am  . CORONARY ANGIOPLASTY WITH STENT PLACEMENT     4 stents  . ESOPHAGEAL DILATION N/A 06/17/2014   Procedure: ESOPHAGEAL DILATION;  Surgeon: Daneil Dolin, MD;  [facility];  Service: Endoscopy;  Laterality: N/A;  Maloney 98 Fr  . ESOPHAGOGASTRODUODENOSCOPY  06/2007   Small hiatal hernia  . ESOPHAGOGASTRODUODENOSCOPY  03/21/2011   RMR: normal esophagus- status post passage of Maloney dilator. Small hiatal hernia. Trivial antral and bulbar erosions  . ESOPHAGOGASTRODUODENOSCOPY (EGD) WITH PROPOFOL N/A 06/17/2014   RMR: Small hiatal hernia; otherwise normal EGD. Status post passage of a Maloney dilator. No explanation for patients right upper quadrant abdominal pain.   Marland Kitchen ESOPHAGOGASTRODUODENOSCOPY (EGD) WITH PROPOFOL N/A 03/19/2016    Procedure: ESOPHAGOGASTRODUODENOSCOPY (EGD) WITH PROPOFOL;  Surgeon: Daneil Dolin, MD;  [facility];  Service: Endoscopy;  Laterality: N/A;  . KNEE SURGERY Left    arthroscopy  . LIPOMA EXCISION  03/17/2012   Procedure: EXCISION LIPOMA;  Surgeon: Gwenyth Ober, MD;  Location: [facility];  Service: General;  Laterality: Right;  excision of right lower back lipoma  . MALONEY DILATION N/A 03/19/2016   Procedure: Venia Minks DILATION;  Surgeon: Daneil Dolin, MD;  [facility];  Service: Endoscopy;  Laterality: N/A;  . POLYPECTOMY  06/17/2014   Procedure: POLYPECTOMY;  Surgeon: Daneil Dolin, MD;  [facility];  Service: Endoscopy;;  . POLYPECTOMY  10/02/2018   Procedure: POLYPECTOMY;  Surgeon: Daneil Dolin, MD;  [facility];  Service: Endoscopy;;  colon  . REDUCTION MAMMAPLASTY Bilateral    Social History   Occupational History  . Occupation: Retired    Comment: Charity fundraiser  Tobacco Use  . Smoking status: Never Smoker  . Smokeless tobacco: Never Used  Vaping Use  . Vaping Use: Never used  Substance and Sexual Activity  . Alcohol use: Not Currently    Alcohol/week: 1.0 standard drink    Types: 1 Glasses of wine per week  . Drug use: No  . Sexual activity: Not Currently    Birth control/protection: Post-menopausal

## 2020-03-02 ENCOUNTER — Ambulatory Visit (HOSPITAL_COMMUNITY)
Admission: RE | Admit: 2020-03-02 | Discharge: 2020-03-02 | Disposition: A | Discharge status: Home / Self Care | Payer: Medicare Other | Source: Ambulatory Visit | Attending: Orthopaedic Surgery | Admitting: Orthopaedic Surgery

## 2020-03-02 ENCOUNTER — Other Ambulatory Visit: Payer: Self-pay

## 2020-03-02 DIAGNOSIS — M25562 Pain in left knee: Secondary | ICD-10-CM | POA: Diagnosis not present

## 2020-03-02 DIAGNOSIS — M170 Bilateral primary osteoarthritis of knee: Secondary | ICD-10-CM | POA: Diagnosis not present

## 2020-03-14 ENCOUNTER — Other Ambulatory Visit: Payer: Self-pay | Admitting: Cardiology

## 2020-03-14 NOTE — Telephone Encounter (Signed)
Prescription refill request for Eliquis received. Indication: Atrial Fibrillation Last office visit: 07/2019  [place] Scr: 1.23 10/2019 Age: 79 Weight: 74.8 kg  Prescription refilled

## 2020-03-17 ENCOUNTER — Encounter: Payer: Self-pay | Admitting: Orthopaedic Surgery

## 2020-03-17 ENCOUNTER — Ambulatory Visit (INDEPENDENT_AMBULATORY_CARE_PROVIDER_SITE_OTHER): Payer: Medicare Other | Admitting: Orthopaedic Surgery

## 2020-03-17 ENCOUNTER — Other Ambulatory Visit: Payer: Self-pay

## 2020-03-17 VITALS — Ht 62.0 in | Wt 165.0 lb

## 2020-03-17 DIAGNOSIS — M170 Bilateral primary osteoarthritis of knee: Secondary | ICD-10-CM | POA: Diagnosis not present

## 2020-03-21 ENCOUNTER — Encounter (HOSPITAL_COMMUNITY): Payer: Self-pay | Admitting: Emergency Medicine

## 2020-03-21 ENCOUNTER — Emergency Department (HOSPITAL_COMMUNITY)
Admission: EM | Admit: 2020-03-21 | Discharge: 2020-03-21 | Disposition: A | Discharge status: Home / Self Care | Payer: Medicare Other | Attending: Emergency Medicine | Admitting: Emergency Medicine

## 2020-03-21 ENCOUNTER — Other Ambulatory Visit: Payer: Self-pay

## 2020-03-21 DIAGNOSIS — I10 Essential (primary) hypertension: Secondary | ICD-10-CM | POA: Diagnosis not present

## 2020-03-21 DIAGNOSIS — E785 Hyperlipidemia, unspecified: Secondary | ICD-10-CM | POA: Diagnosis not present

## 2020-03-21 DIAGNOSIS — I2510 Atherosclerotic heart disease of native coronary artery without angina pectoris: Secondary | ICD-10-CM | POA: Insufficient documentation

## 2020-03-21 DIAGNOSIS — E039 Hypothyroidism, unspecified: Secondary | ICD-10-CM | POA: Diagnosis not present

## 2020-03-21 DIAGNOSIS — Z794 Long term (current) use of insulin: Secondary | ICD-10-CM | POA: Diagnosis not present

## 2020-03-21 DIAGNOSIS — E1169 Type 2 diabetes mellitus with other specified complication: Secondary | ICD-10-CM | POA: Insufficient documentation

## 2020-03-21 DIAGNOSIS — R0981 Nasal congestion: Secondary | ICD-10-CM | POA: Insufficient documentation

## 2020-03-21 DIAGNOSIS — N183 Chronic kidney disease, stage 3 unspecified: Secondary | ICD-10-CM | POA: Diagnosis not present

## 2020-03-21 DIAGNOSIS — R739 Hyperglycemia, unspecified: Secondary | ICD-10-CM

## 2020-03-21 DIAGNOSIS — Z79899 Other long term (current) drug therapy: Secondary | ICD-10-CM | POA: Insufficient documentation

## 2020-03-21 DIAGNOSIS — E1165 Type 2 diabetes mellitus with hyperglycemia: Secondary | ICD-10-CM | POA: Insufficient documentation

## 2020-03-21 DIAGNOSIS — R42 Dizziness and giddiness: Secondary | ICD-10-CM | POA: Diagnosis not present

## 2020-03-21 DIAGNOSIS — I4819 Other persistent atrial fibrillation: Secondary | ICD-10-CM | POA: Diagnosis not present

## 2020-03-21 DIAGNOSIS — Z7984 Long term (current) use of oral hypoglycemic drugs: Secondary | ICD-10-CM | POA: Insufficient documentation

## 2020-03-21 DIAGNOSIS — R5383 Other fatigue: Secondary | ICD-10-CM | POA: Diagnosis not present

## 2020-03-21 DIAGNOSIS — I4891 Unspecified atrial fibrillation: Secondary | ICD-10-CM | POA: Insufficient documentation

## 2020-03-21 LAB — CBG MONITORING, ED
Glucose-Capillary: 480 mg/dL — ABNORMAL HIGH (ref 70–99)
Glucose-Capillary: 443 mg/dL — ABNORMAL HIGH (ref 70–99)
Glucose-Capillary: 270 mg/dL — ABNORMAL HIGH (ref 70–99)

## 2020-03-21 LAB — URINALYSIS, ROUTINE W REFLEX MICROSCOPIC
Bacteria, UA: NONE SEEN
Bilirubin Urine: NEGATIVE
Glucose, UA: >=500 mg/dL — AB
Hgb urine dipstick: NEGATIVE
Ketones, ur: NEGATIVE mg/dL
Leukocytes,Ua: NEGATIVE
Nitrite: NEGATIVE
Protein, ur: NEGATIVE mg/dL
Specific Gravity, Urine: 1.029 (ref 1.005–1.030)
pH: 5 (ref 5.0–8.0)

## 2020-03-21 LAB — CBC
HCT: 41.6 % (ref 36.0–46.0)
Hemoglobin: 13.2 g/dL (ref 12.0–15.0)
MCH: 30.5 pg (ref 26.0–34.0)
MCHC: 31.7 g/dL (ref 30.0–36.0)
MCV: 96.1 fL (ref 80.0–100.0)
Platelets: 123 10*3/uL — ABNORMAL LOW (ref 150–400)
RBC: 4.33 MIL/uL (ref 3.87–5.11)
RDW: 13.3 % (ref 11.5–15.5)
WBC: 6 10*3/uL (ref 4.0–10.5)
nRBC: 0 % (ref 0.0–0.2)

## 2020-03-21 LAB — BASIC METABOLIC PANEL
Anion gap: 8 (ref 5–15)
BUN: 19 mg/dL (ref 8–23)
CO2: 30 mmol/L (ref 22–32)
Calcium: 9.3 mg/dL (ref 8.9–10.3)
Chloride: 97 mmol/L — ABNORMAL LOW (ref 98–111)
Creatinine, Ser: 1.11 mg/dL — ABNORMAL HIGH (ref 0.44–1.00)
GFR, Estimated: 51 mL/min — ABNORMAL LOW (ref >60)
Glucose, Bld: 551 mg/dL (ref 70–99)
Potassium: 4.1 mmol/L (ref 3.5–5.1)
Sodium: 135 mmol/L (ref 135–145)

## 2020-03-21 MED ORDER — SODIUM CHLORIDE 0.9 % IV BOLUS
1000.0000 mL | Freq: Once | INTRAVENOUS | Status: AC
  Administered 2020-03-21: 1000 mL via INTRAVENOUS

## 2020-03-21 MED ORDER — INSULIN ASPART 100 UNIT/ML ~~LOC~~ SOLN
15.0000 [IU] | Freq: Once | SUBCUTANEOUS | Status: AC
  Administered 2020-03-21: 15 [IU] via SUBCUTANEOUS
  Filled 2020-03-21: qty 1

## 2020-03-21 MED ORDER — METFORMIN HCL 500 MG PO TABS: 500.0000 mg | ORAL_TABLET | Freq: Every day | ORAL | 0 refills | Status: DC

## 2020-03-21 NOTE — ED Notes (Signed)
CRITICAL VALUE ALERT  Critical Value:  Glucose 551  Date & Time Notied:  03/21/20 0145  Provider Notified: [email or web address]  Orders Received/Actions taken: md notified

## 2020-03-21 NOTE — ED Notes (Signed)
Pt given ice chips

## 2020-03-21 NOTE — ED Provider Notes (Addendum)
Rhonda Garcia   CSN: [id number] Arrival date & time: 03/21/20  1157     History Chief Complaint  Patient presents with  . Hyperglycemia    Rhonda Garcia is a 79 y.o. female.  Patient states her blood sugars been running in the 400s this week.  She takes Glucophage 1000 mg twice a day.  Her doctor told her to come to the emergency department no other complaints.  The history is provided by the patient and medical records. No language interpreter was used.  Hyperglycemia Severity:  Moderate Onset quality:  Sudden Timing:  Constant Progression:  Waxing and waning Chronicity:  New Diabetes status:  Controlled with oral medications Context: not change in medication   Relieved by:  Nothing Ineffective treatments:  None tried Associated symptoms: no abdominal pain, no chest pain and no fatigue        Past Medical History:  Diagnosis Date  . A-fib ([place])   . Anal fissure    resolved   . Anemia   . Back pain    with left radiculopathy  . CAD (coronary artery disease)   . Cataract   . Collagen vascular disease ([place])   . DDD (degenerative disc disease)   . Depression   . Diabetes mellitus   . Diverticulosis   . Dyslipidemia   . Dysrhythmia    AFib  . Esophagitis   . GERD (gastroesophageal reflux disease)   . Hiatal hernia   . Hx of peptic ulcer   . Hyperlipidemia   . Hypertension   . Hypothyroidism   . Internal hemorrhoids 2017  . NSTEMI (non-ST elevated myocardial infarction) ([place]) 08/05/2015  . Osteopenia   . Sleep apnea    not using CPAP now, could not tolerate and PCP aware.  . Thyroid disease     Patient Active Problem List   Diagnosis Date Noted  . Bilateral primary osteoarthritis of knee 01/28/2020  . Unintentional weight loss 05/05/2019  . Abdominal pain 01/01/2019  . Chest pain, rule out acute myocardial infarction 12/19/2018  . Diabetes mellitus ([place]) 12/19/2018  . History of atrial fibrillation 12/19/2018  . Hx  of non-ST elevation myocardial infarction (NSTEMI) 12/19/2018  . Anemia 07/22/2018  . Shortness of breath 01/09/2017  . Obese 11/05/2016  . Vertigo 06/27/2016  . GAD (generalized anxiety disorder) 10/27/2015  . Coronary artery disease involving native coronary artery of native heart with unstable angina pectoris ([place])   . Hx of adenomatous colonic polyps 06/22/2015  . Hypothyroidism 01/17/2015  . Vitamin D deficiency 09/09/2014  . Hiatal hernia   . Dysphagia, pharyngoesophageal phase   . History of colonic polyps   . Diverticulosis of colon without hemorrhage   . Paroxysmal atrial fibrillation ([place]) 11/02/2013  . Long term current use of anticoagulant therapy 11/02/2013  . Depression 10/28/2012  . Type 2 diabetes mellitus with hyperglycemia ([place]) 10/28/2012  . Lipoma of back 02/12/2012  . Constipation 02/28/2011  . Esophageal dysphagia 02/28/2011  . Gastroesophageal reflux 02/28/2011  . POSITIONAL VERTIGO 04/29/2009  . Osteoarthritis 12/23/2008  . Hyperlipidemia associated with type 2 diabetes mellitus ([place]) 12/21/2008  . Hypertension associated with diabetes ([place]) 12/21/2008    Past Surgical History:  Procedure Laterality Date  . ABDOMINAL HYSTERECTOMY Bilateral 2001 - approximate  . APPENDECTOMY    . BREAST REDUCTION SURGERY    . CARDIAC CATHETERIZATION N/A 08/08/2015   Procedure: Left Heart Cath and Coronary Angiography;  Surgeon: Burnell Blanks, MD;  Location: [facility];  Service: Cardiovascular;  Laterality: N/A;  . CHOLECYSTECTOMY  2008 - approximate  . COLONOSCOPY  06/2007   Friable anal canal and anal papillae. Pancolonic diverticula. Normal terminal ileum. Status post segmental biopsy, descending colon biopsy showed minimal cryptitis. Biopsies felt to be  nonspecific but could be seen with NSAIDs. No features of inflammatory bowel disease. Stool studies were negative.  . COLONOSCOPY  03/21/2011   RMR: colonic polyps treated as described above, colonic  diverticulosis (Tubular adenomas)  . COLONOSCOPY WITH PROPOFOL N/A 06/17/2014   RMR: Colonic diverticulosis. Multiple colonic polyps removed as described above.   . COLONOSCOPY WITH PROPOFOL N/A 03/19/2016   Procedure: COLONOSCOPY WITH PROPOFOL;  Surgeon: Daneil Dolin, MD;  [facility];  Service: Endoscopy;  Laterality: N/A;  8:45am  . COLONOSCOPY WITH PROPOFOL N/A 10/02/2018   Procedure: COLONOSCOPY WITH PROPOFOL;  Surgeon: Daneil Dolin, MD;  [facility];  Service: Endoscopy;  Laterality: N/A;  9:15am  . CORONARY ANGIOPLASTY WITH STENT PLACEMENT     4 stents  . ESOPHAGEAL DILATION N/A 06/17/2014   Procedure: ESOPHAGEAL DILATION;  Surgeon: Daneil Dolin, MD;  Location: [facility];  Service: Endoscopy;  Laterality: N/A;  Maloney 52 Fr  . ESOPHAGOGASTRODUODENOSCOPY  06/2007   Small hiatal hernia  . ESOPHAGOGASTRODUODENOSCOPY  03/21/2011   RMR: normal esophagus- status post passage of Maloney dilator. Small hiatal hernia. Trivial antral and bulbar erosions  . ESOPHAGOGASTRODUODENOSCOPY (EGD) WITH PROPOFOL N/A 06/17/2014   RMR: Small hiatal hernia; otherwise normal EGD. Status post passage of a Maloney dilator. No explanation for patients right upper quadrant abdominal pain.   Marland Kitchen ESOPHAGOGASTRODUODENOSCOPY (EGD) WITH PROPOFOL N/A 03/19/2016   Procedure: ESOPHAGOGASTRODUODENOSCOPY (EGD) WITH PROPOFOL;  Surgeon: Daneil Dolin, MD;  [facility];  Service: Endoscopy;  Laterality: N/A;  . KNEE SURGERY Left    arthroscopy  . LIPOMA EXCISION  03/17/2012   Procedure: EXCISION LIPOMA;  Surgeon: Gwenyth Ober, MD;  Location: [facility];  Service: General;  Laterality: Right;  excision of right lower back lipoma  . MALONEY DILATION N/A 03/19/2016   Procedure: Venia Minks DILATION;  Surgeon: Daneil Dolin, MD;  [facility];  Service: Endoscopy;  Laterality: N/A;  . POLYPECTOMY  06/17/2014   Procedure: POLYPECTOMY;  Surgeon: Daneil Dolin, MD;   Location: [facility];  Service: Endoscopy;;  . POLYPECTOMY  10/02/2018   Procedure: POLYPECTOMY;  Surgeon: Daneil Dolin, MD;  [facility];  Service: Endoscopy;;  colon  . REDUCTION MAMMAPLASTY Bilateral      OB History   No obstetric history on file.     Family History  Problem Relation Age of Onset  . Heart attack Mother   . Hypertension Mother   . Stroke Mother        Deceased, age 79  . Diabetes Sister   . Diabetes Sister   . Cancer Sister        ovarian  . Stroke Sister   . Diabetes Brother        also has a blood disorder   . Clotting disorder Brother   . Kidney disease Brother   . Heart disease Brother   . Melanoma Brother        deceased  . Coronary artery disease Other   . Kidney disease Other   . Diabetes Son   . Hypertension Son   . Coronary artery disease Son 44       CABG  .  Hypertension Son   . Coronary artery disease Son 53       CABG  . Colon cancer Neg Hx     Social History   Tobacco Use  . Smoking status: Never Smoker  . Smokeless tobacco: Never Used  Vaping Use  . Vaping Use: Never used  Substance Use Topics  . Alcohol use: Not Currently    Alcohol/week: 1.0 standard drink    Types: 1 Glasses of wine per week  . Drug use: No    Home Medications Prior to Admission medications   Medication Sig Start Date End Date Taking? Authorizing Provider  ALPRAZolam Duanne Moron) 0.5 MG tablet Take 1 tablet (0.5 mg total) by mouth at bedtime as needed. 10/22/19   Sharion Balloon, FNP  baclofen (LIORESAL) 10 MG tablet Take 1 tablet (10 mg total) by mouth 3 (three) times daily. Patient not taking: No sig reported 12/03/19   Sharion Balloon, FNP  blood glucose meter kit and supplies Dispense based on patient and insurance preference. Use up to four times daily as directed. (FOR ICD-10 E10.9, E11.9). 12/03/19   Sharion Balloon, FNP  busPIRone (BUSPAR) 5 MG tablet TAKE ONE (1) TABLET THREE (3) TIMES EACH DAY 12/16/19   Hawks, Christy A, FNP  ELIQUIS 5 MG  TABS tablet TAKE ONE TABLET BY MOUTH TWICE DAILY 03/14/20   Minus Breeding, MD  escitalopram (LEXAPRO) 10 MG tablet Take 1 tablet (10 mg total) by mouth daily. 01/10/17   Sharion Balloon, FNP  isosorbide mononitrate (IMDUR) 60 MG 24 hr tablet TAKE ONE (1) TABLET EACH DAY 05/12/19   Minus Breeding, MD  levothyroxine (SYNTHROID) 50 MCG tablet TAKE ONE (1) TABLET EACH DAY 10/14/19   Sharion Balloon, FNP  linaclotide (LINZESS) 72 MCG capsule Take 1 capsule (72 mcg total) by mouth daily before breakfast. 10/22/19   Evelina Dun A, FNP  lisinopril (ZESTRIL) 10 MG tablet Take 1 tablet (10 mg total) by mouth daily. 11/26/19   Sharion Balloon, FNP  metFORMIN (GLUCOPHAGE) 1000 MG tablet TAKE ONE TABLET BY MOUTH TWICE A DAY WITH FOOD 11/24/19   Evelina Dun A, FNP  metFORMIN (GLUCOPHAGE) 500 MG tablet Take 1 tablet (500 mg total) by mouth daily with breakfast. 03/21/20   Milton Ferguson, MD  metoprolol tartrate (LOPRESSOR) 50 MG tablet TAKE ONE TABLET BY MOUTH TWICE DAILY 05/12/19   Evelina Dun A, FNP  nitroGLYCERIN (NITROSTAT) 0.4 MG SL tablet Place 1 tablet (0.4 mg total) under the tongue every 5 (five) minutes as needed for chest pain. 08/29/18   Evelina Dun A, FNP  pantoprazole (PROTONIX) 40 MG tablet Take 1 tablet (40 mg total) by mouth 2 (two) times daily. 11/13/18   Sharion Balloon, FNP  pravastatin (PRAVACHOL) 80 MG tablet TAKE 1 TABLET DAILY IN THE EVENING 12/16/19   Evelina Dun A, FNP    Allergies    Promethazine hcl  Review of Systems   Review of Systems  Constitutional: Negative for appetite change and fatigue.       Weakness  HENT: Negative for congestion, ear discharge and sinus pressure.   Eyes: Negative for discharge.  Respiratory: Negative for cough.   Cardiovascular: Negative for chest pain.  Gastrointestinal: Negative for abdominal pain and diarrhea.  Genitourinary: Negative for frequency and hematuria.  Musculoskeletal: Negative for back pain.  Skin: Negative for rash.   Neurological: Negative for seizures and headaches.  Psychiatric/Behavioral: Negative for hallucinations.    Physical Exam Updated Vital Signs BP (!) 151/75 (  BP Location: Left Arm)   Pulse 94   Temp 97.9 F (36.6 C) (Oral)   Resp 18   Ht 5' 2"  (1.575 m)   Wt 74.4 kg   SpO2 98%   BMI 30.00 kg/m   Physical Exam Vitals and nursing Garcia reviewed.  Constitutional:      Appearance: She is well-developed.  HENT:     Head: Normocephalic.     Nose: Congestion present.     Mouth/Throat:     Mouth: Mucous membranes are moist.  Eyes:     General: No scleral icterus.    Extraocular Movements: EOM normal.     Conjunctiva/sclera: Conjunctivae normal.  Neck:     Thyroid: No thyromegaly.  Cardiovascular:     Rate and Rhythm: Normal rate and regular rhythm.     Heart sounds: No murmur heard. No friction rub. No gallop.   Pulmonary:     Breath sounds: No stridor. No wheezing or rales.  Chest:     Chest wall: No tenderness.  Abdominal:     General: There is no distension.     Tenderness: There is no abdominal tenderness. There is no rebound.  Musculoskeletal:        General: No edema. Normal range of motion.     Cervical back: Neck supple.  Lymphadenopathy:     Cervical: No cervical adenopathy.  Skin:    Findings: No erythema or rash.  Neurological:     Mental Status: She is alert and oriented to person, place, and time.     Motor: No abnormal muscle tone.     Coordination: Coordination normal.  Psychiatric:        Mood and Affect: Mood and affect normal.        Behavior: Behavior normal.     ED Results / Procedures / Treatments   Labs (all labs ordered are listed, but only abnormal results are displayed) Labs Reviewed  BASIC METABOLIC PANEL - Abnormal; Notable for the following components:      Result Value   Chloride 97 (*)    Glucose, Bld 551 (*)    Creatinine, Ser 1.11 (*)    GFR, Estimated 51 (*)    All other components within normal limits  CBC - Abnormal;  Notable for the following components:   Platelets 123 (*)    All other components within normal limits  URINALYSIS, ROUTINE W REFLEX MICROSCOPIC - Abnormal; Notable for the following components:   Glucose, UA >=500 (*)    All other components within normal limits  CBG MONITORING, ED - Abnormal; Notable for the following components:   Glucose-Capillary 480 (*)    All other components within normal limits  CBG MONITORING, ED - Abnormal; Notable for the following components:   Glucose-Capillary 443 (*)    All other components within normal limits  CBG MONITORING, ED - Abnormal; Notable for the following components:   Glucose-Capillary 270 (*)    All other components within normal limits    EKG None  Radiology No results found.  Procedures Procedures (including critical care time)  Medications Ordered in ED Medications  sodium chloride 0.9 % bolus 1,000 mL (0 mLs Intravenous Stopped 03/21/20 1823)  insulin aspart (novoLOG) injection 15 Units (15 Units Subcutaneous Given 03/21/20 1651)    ED Course  I have reviewed the triage vital signs and the nursing notes.  Pertinent labs & imaging results that were available during my care of the patient were reviewed by me and considered in  my medical decision making (see chart for details). CRITICAL CARE Performed by: Milton Ferguson Total critical care time: 35 minutes Critical care time was exclusive of separately billable procedures and treating other patients. Critical care was necessary to treat or prevent imminent or life-threatening deterioration. Critical care was time spent personally by me on the following activities: development of treatment plan with patient and/or surrogate as well as nursing, discussions with consultants, evaluation of patient's response to treatment, examination of patient, obtaining history from patient or surrogate, ordering and performing treatments and interventions, ordering and review of laboratory  studies, ordering and review of radiographic studies, pulse oximetry and re-evaluation of patient's condition.    MDM Rules/Calculators/A&P                        Patient with poorly controlled diabetes.  She was given fluids and insulin and her sugar came back down.  She will be given an additional 5 mg of Glucophage today to total about 2500 mg a day.  She will follow up with her PCP Final Clinical Impression(s) / ED Diagnoses Final diagnoses:  Hyperglycemia    Rx / DC Orders ED Discharge Orders         Ordered    metFORMIN (GLUCOPHAGE) 500 MG tablet  Daily with breakfast        03/21/20 1830           Milton Ferguson, MD 03/21/20 Colon Flattery, MD 04/14/20 1209

## 2020-03-21 NOTE — ED Triage Notes (Signed)
Pt was seen by urgent care and was told to come here because her blood sugar read high.

## 2020-03-21 NOTE — Discharge Instructions (Addendum)
Drink plenty of fluids.  Follow up with your md  this week if possible. Call them and tell them you were in the emergency department today

## 2020-03-23 ENCOUNTER — Ambulatory Visit: Payer: Medicare Other | Admitting: Family

## 2020-03-23 ENCOUNTER — Ambulatory Visit (INDEPENDENT_AMBULATORY_CARE_PROVIDER_SITE_OTHER): Payer: Medicare Other | Admitting: Family Medicine

## 2020-03-23 ENCOUNTER — Other Ambulatory Visit: Payer: Self-pay

## 2020-03-23 ENCOUNTER — Encounter: Payer: Self-pay | Admitting: Family Medicine

## 2020-03-23 VITALS — BP 138/67 | HR 69 | Temp 97.3°F | Ht 62.0 in | Wt 155.0 lb

## 2020-03-23 DIAGNOSIS — E1169 Type 2 diabetes mellitus with other specified complication: Secondary | ICD-10-CM

## 2020-03-23 DIAGNOSIS — E1165 Type 2 diabetes mellitus with hyperglycemia: Secondary | ICD-10-CM

## 2020-03-23 DIAGNOSIS — Z23 Encounter for immunization: Secondary | ICD-10-CM

## 2020-03-23 LAB — BMP8+EGFR
BUN/Creatinine Ratio: 15 (ref 12–28)
BUN: 15 mg/dL (ref 8–27)
CO2: 25 mmol/L (ref 20–29)
Calcium: 9.3 mg/dL (ref 8.7–10.3)
Chloride: 103 mmol/L (ref 96–106)
Creatinine, Ser: 0.99 mg/dL (ref 0.57–1.00)
GFR calc Af Amer: 63 mL/min/{1.73_m2} (ref >59)
GFR calc non Af Amer: 55 mL/min/{1.73_m2} — ABNORMAL LOW (ref >59)
Glucose: 265 mg/dL — ABNORMAL HIGH (ref 65–99)
Potassium: 4.3 mmol/L (ref 3.5–5.2)
Sodium: 140 mmol/L (ref 134–144)

## 2020-03-23 LAB — BAYER DCA HB A1C WAIVED: HB A1C (BAYER DCA - WAIVED): 10.7 % — ABNORMAL HIGH (ref <7.0)

## 2020-03-23 MED ORDER — OZEMPIC (0.25 OR 0.5 MG/DOSE) 2 MG/1.5ML ~~LOC~~ SOPN: 0.5000 mg | PEN_INJECTOR | SUBCUTANEOUS | 3 refills | Status: DC

## 2020-03-23 NOTE — Progress Notes (Signed)
Office Visit Note   Patient: Rhonda Garcia           Date of Birth: 1940-10-13           MRN: [id number] Visit Date: 03/17/2020              Requested by: Sharion Balloon, [address],  [place] [address] PCP: Sharion Balloon, FNP   Assessment & Plan: Visit Diagnoses:  1. Bilateral primary osteoarthritis of knee     Plan: Patient gotten some improvement in her left knee.  We reviewed the MRI scan which showed degenerative changes throughout the knee.  She has had some progression from previous imaging.  She is walking better and currently surgical intervention is not required.  I will check her back again in 3 months.  Follow-Up Instructions: Return in about 3 months (around 06/15/2020).   Orders:  No orders of the defined types were placed in this encounter.  No orders of the defined types were placed in this encounter.     Procedures: No procedures performed   Clinical Data: No additional findings.   Subjective: Chief Complaint  Patient presents with  . Left Knee - Pain, Follow-up    MRI review    HPI 79 year old female returns with ongoing problems with knee pain worse on the left knee the right knee.  She has had an MRI scan which is available for review.  Her knee been catching.  She does have diabetes history of atrial fib disease coronary artery disease and takes Eliquis chronically.  She states her knee injection gave her minimal improvement.  Review of Systems all other systems update unchanged from 02/11/2020 office visit.   Objective: Vital Signs: Ht 5\' 2"  (1.575 m)   Wt 165 lb (74.8 kg)   BMI 30.18 kg/m   Physical Exam Constitutional:      Appearance: She is well-developed.  HENT:     Head: Normocephalic.     Right Ear: External ear normal.     Left Ear: External ear normal.  Eyes:     Pupils: Pupils are equal, round, and reactive to light.  Neck:     Thyroid: No thyromegaly.     Trachea: No tracheal deviation.   Cardiovascular:     Rate and Rhythm: Normal rate.  Pulmonary:     Effort: Pulmonary effort is normal.  Abdominal:     Palpations: Abdomen is soft.  Skin:    General: Skin is warm and dry.  Neurological:     Mental Status: She is alert and oriented to person, place, and time.  Psychiatric:        Mood and Affect: Mood and affect normal.        Behavior: Behavior normal.     Ortho Exam patient has crepitus with knee range of motion.  She comes within 3 degrees of full extension.  Positive anterior drawer.  Crepitus with knee extension and flexion.  She is amatory with a slight left knee limp.  Distal pulses are palpable. Specialty Comments:  No specialty comments available.  Imaging: CLINICAL DATA:  Chronic left knee pain  EXAM: MRI OF THE LEFT KNEE WITHOUT CONTRAST  TECHNIQUE: Multiplanar, multisequence MR imaging of the knee was performed. No intravenous contrast was administered.  COMPARISON:  X-ray 01/20/2020, MRI 07/12/2003  FINDINGS: MENISCI  Medial meniscus: Degenerative tearing of the medial meniscal body and posterior horn. The mid body segment is medially extruded.  Lateral meniscus: Extensive degenerative  tearing of the lateral meniscus with large horizontal component of the body with additional irregular radial components along the free edge. Mild meniscal extrusion.  LIGAMENTS  Cruciates: Chronically ACL deficient knee. Intact PCL with mucoid degeneration near its femoral attachment.  Collaterals: Medial collateral ligament is intact. Lateral collateral ligament complex is intact.  CARTILAGE  Patellofemoral: Mild chondral thinning and surface irregularity of the lateral patellar facet and patellar apex.  Medial: Moderately advanced diffuse chondral thinning and surface irregularity of the medial compartment with large marginal osteophytes and associated subchondral marrow signal changes.  Lateral: Mild diffuse chondral thinning  with irregular full-thickness fissure of the central weight-bearing lateral femoral condyle (series 12, image 20). Prominent marginal osteophytes.  Joint:  No joint effusion.  Fat pads within normal limits.  Popliteal Fossa:  No Baker cyst. Intact popliteus tendon.  Extensor Mechanism:  Intact quadriceps tendon and patellar tendon.  Bones: Tricompartmental joint space narrowing and marginal osteophyte formation. Reactive subchondral marrow signal changes at the periphery of the medial compartment. No acute fracture or dislocation. No suspicious bone lesion.  Other: Superficial venous varicosities.  IMPRESSION: 1. Moderate tricompartmental osteoarthritis of the left knee, most pronounced in the medial compartment. Findings significantly progressed from prior MRI. 2. Extensive degenerative tearing of the medial and lateral menisci, as above. 3. Chronically ACL deficient knee.   Electronically Signed   By: Davina Poke D.O.   On: 03/03/2020 13:31   PMFS History: Patient Active Problem List   Diagnosis Date Noted  . Bilateral primary osteoarthritis of knee 01/28/2020  . Unintentional weight loss 05/05/2019  . Abdominal pain 01/01/2019  . Chest pain, rule out acute myocardial infarction 12/19/2018  . Diabetes mellitus ([place]) 12/19/2018  . History of atrial fibrillation 12/19/2018  . Hx of non-ST elevation myocardial infarction (NSTEMI) 12/19/2018  . Anemia 07/22/2018  . Shortness of breath 01/09/2017  . Obese 11/05/2016  . Vertigo 06/27/2016  . GAD (generalized anxiety disorder) 10/27/2015  . Coronary artery disease involving native coronary artery of native heart with unstable angina pectoris ([place])   . Hx of adenomatous colonic polyps 06/22/2015  . Hypothyroidism 01/17/2015  . Vitamin D deficiency 09/09/2014  . Hiatal hernia   . Dysphagia, pharyngoesophageal phase   . History of colonic polyps   . Diverticulosis of colon without hemorrhage   . Paroxysmal  atrial fibrillation ([place]) 11/02/2013  . Long term current use of anticoagulant therapy 11/02/2013  . Depression 10/28/2012  . Type 2 diabetes mellitus with hyperglycemia ([place]) 10/28/2012  . Lipoma of back 02/12/2012  . Constipation 02/28/2011  . Esophageal dysphagia 02/28/2011  . Gastroesophageal reflux 02/28/2011  . POSITIONAL VERTIGO 04/29/2009  . Osteoarthritis 12/23/2008  . Hyperlipidemia associated with type 2 diabetes mellitus ([place]) 12/21/2008  . Hypertension associated with diabetes ([place]) 12/21/2008   Past Medical History:  Diagnosis Date  . A-fib ([place])   . Anal fissure    resolved   . Anemia   . Back pain    with left radiculopathy  . CAD (coronary artery disease)   . Cataract   . Collagen vascular disease ([place])   . DDD (degenerative disc disease)   . Depression   . Diabetes mellitus   . Diverticulosis   . Dyslipidemia   . Dysrhythmia    AFib  . Esophagitis   . GERD (gastroesophageal reflux disease)   . Hiatal hernia   . Hx of peptic ulcer   . Hyperlipidemia   . Hypertension   . Hypothyroidism   . Internal hemorrhoids  2017  . NSTEMI (non-ST elevated myocardial infarction) ([place]) 08/05/2015  . Osteopenia   . Sleep apnea    not using CPAP now, could not tolerate and PCP aware.  . Thyroid disease     Family History  Problem Relation Age of Onset  . Heart attack Mother   . Hypertension Mother   . Stroke Mother        Deceased, age 35  . Diabetes Sister   . Diabetes Sister   . Cancer Sister        ovarian  . Stroke Sister   . Diabetes Brother        also has a blood disorder   . Clotting disorder Brother   . Kidney disease Brother   . Heart disease Brother   . Melanoma Brother        deceased  . Coronary artery disease Other   . Kidney disease Other   . Diabetes Son   . Hypertension Son   . Coronary artery disease Son 68       CABG  . Hypertension Son   . Coronary artery disease Son 59       CABG  . Colon cancer Neg Hx     Past Surgical  History:  Procedure Laterality Date  . ABDOMINAL HYSTERECTOMY Bilateral 2001 - approximate  . APPENDECTOMY    . BREAST REDUCTION SURGERY    . CARDIAC CATHETERIZATION N/A 08/08/2015   Procedure: Left Heart Cath and Coronary Angiography;  Surgeon: Burnell Blanks, MD;  Location: [facility];  Service: Cardiovascular;  Laterality: N/A;  . CHOLECYSTECTOMY  2008 - approximate  . COLONOSCOPY  06/2007   Friable anal canal and anal papillae. Pancolonic diverticula. Normal terminal ileum. Status post segmental biopsy, descending colon biopsy showed minimal cryptitis. Biopsies felt to be  nonspecific but could be seen with NSAIDs. No features of inflammatory bowel disease. Stool studies were negative.  . COLONOSCOPY  03/21/2011   RMR: colonic polyps treated as described above, colonic diverticulosis (Tubular adenomas)  . COLONOSCOPY WITH PROPOFOL N/A 06/17/2014   RMR: Colonic diverticulosis. Multiple colonic polyps removed as described above.   . COLONOSCOPY WITH PROPOFOL N/A 03/19/2016   Procedure: COLONOSCOPY WITH PROPOFOL;  Surgeon: Daneil Dolin, MD;  [facility];  Service: Endoscopy;  Laterality: N/A;  8:45am  . COLONOSCOPY WITH PROPOFOL N/A 10/02/2018   Procedure: COLONOSCOPY WITH PROPOFOL;  Surgeon: Daneil Dolin, MD;  [facility];  Service: Endoscopy;  Laterality: N/A;  9:15am  . CORONARY ANGIOPLASTY WITH STENT PLACEMENT     4 stents  . ESOPHAGEAL DILATION N/A 06/17/2014   Procedure: ESOPHAGEAL DILATION;  Surgeon: Daneil Dolin, MD;  Location: [facility];  Service: Endoscopy;  Laterality: N/A;  Maloney 28 Fr  . ESOPHAGOGASTRODUODENOSCOPY  06/2007   Small hiatal hernia  . ESOPHAGOGASTRODUODENOSCOPY  03/21/2011   RMR: normal esophagus- status post passage of Maloney dilator. Small hiatal hernia. Trivial antral and bulbar erosions  . ESOPHAGOGASTRODUODENOSCOPY (EGD) WITH PROPOFOL N/A 06/17/2014   RMR: Small hiatal hernia; otherwise normal EGD. Status post passage  of a Maloney dilator. No explanation for patients right upper quadrant abdominal pain.   Marland Kitchen ESOPHAGOGASTRODUODENOSCOPY (EGD) WITH PROPOFOL N/A 03/19/2016   Procedure: ESOPHAGOGASTRODUODENOSCOPY (EGD) WITH PROPOFOL;  Surgeon: Daneil Dolin, MD;  [facility];  Service: Endoscopy;  Laterality: N/A;  . KNEE SURGERY Left    arthroscopy  . LIPOMA EXCISION  03/17/2012   Procedure: EXCISION LIPOMA;  Surgeon: Jeneen Rinks  Michael Boston, MD;  Location: [facility];  Service: General;  Laterality: Right;  excision of right lower back lipoma  . MALONEY DILATION N/A 03/19/2016   Procedure: Venia Minks DILATION;  Surgeon: Daneil Dolin, MD;  [facility];  Service: Endoscopy;  Laterality: N/A;  . POLYPECTOMY  06/17/2014   Procedure: POLYPECTOMY;  Surgeon: Daneil Dolin, MD;  Location[facility];  Service: Endoscopy;;  . POLYPECTOMY  10/02/2018   Procedure: POLYPECTOMY;  Surgeon: Daneil Dolin, MD;  [facility];  Service: Endoscopy;;  colon  . REDUCTION MAMMAPLASTY Bilateral    Social History   Occupational History  . Occupation: Retired    Comment: Charity fundraiser  Tobacco Use  . Smoking status: Never Smoker  . Smokeless tobacco: Never Used  Vaping Use  . Vaping Use: Never used  Substance and Sexual Activity  . Alcohol use: Not Currently    Alcohol/week: 1.0 standard drink    Types: 1 Glasses of wine per week  . Drug use: No  . Sexual activity: Not Currently    Birth control/protection: Post-menopausal

## 2020-03-23 NOTE — Progress Notes (Signed)
BP 138/67   Pulse 69   Temp (!) 97.3 F (36.3 C)   Ht 5' 2"  (1.575 m)   Wt 155 lb (70.3 kg)   SpO2 97%   BMI 28.35 kg/m    Subjective:   Patient ID: Rhonda Garcia, female    DOB: 1940-10-18, 79 y.o.   MRN: [id number]  HPI: Rhonda Garcia is a 79 y.o. female presenting on 03/23/2020 for Diabetes   HPI Type 2 diabetes mellitus Patient comes in today for recheck of his diabetes. Patient has been currently taking Metformin, A1c is 10.1, has been running up.. Patient is currently on an ACE inhibitor/ARB. Patient has not seen an ophthalmologist this year. Patient denies any issues with their feet. The symptom started onset as an adult hypertension and CAD and A. fib and hyperlipidemia ARE RELATED TO DM   Relevant past medical, surgical, family and social history reviewed and updated as indicated. Interim medical history since our last visit reviewed. Allergies and medications reviewed and updated.  Review of Systems  Constitutional: Negative for chills and fever.  Eyes: Negative for visual disturbance.  Respiratory: Negative for chest tightness and shortness of breath.   Cardiovascular: Negative for chest pain and leg swelling.  Skin: Negative for rash.  Neurological: Negative for light-headedness and headaches.  Psychiatric/Behavioral: Negative for agitation and behavioral problems.  All other systems reviewed and are negative.   Per HPI unless specifically indicated above   Allergies as of 03/23/2020      Reactions   Promethazine Hcl Anaphylaxis      Medication List       Accurate as of March 23, 2020 11:04 AM. If you have any questions, ask your nurse or doctor.        ALPRAZolam 0.5 MG tablet Commonly known as: XANAX Take 1 tablet (0.5 mg total) by mouth at bedtime as needed.   baclofen 10 MG tablet Commonly known as: LIORESAL Take 1 tablet (10 mg total) by mouth 3 (three) times daily.   blood glucose meter kit and supplies Dispense based on patient and  insurance preference. Use up to four times daily as directed. (FOR ICD-10 E10.9, E11.9).   busPIRone 5 MG tablet Commonly known as: BUSPAR TAKE ONE (1) TABLET THREE (3) TIMES EACH DAY   Eliquis 5 MG Tabs tablet Generic drug: apixaban TAKE ONE TABLET BY MOUTH TWICE DAILY   escitalopram 10 MG tablet Commonly known as: Lexapro Take 1 tablet (10 mg total) by mouth daily.   isosorbide mononitrate 60 MG 24 hr tablet Commonly known as: IMDUR TAKE ONE (1) TABLET EACH DAY   levothyroxine 50 MCG tablet Commonly known as: SYNTHROID TAKE ONE (1) TABLET EACH DAY   linaclotide 72 MCG capsule Commonly known as: Linzess Take 1 capsule (72 mcg total) by mouth daily before breakfast.   lisinopril 10 MG tablet Commonly known as: ZESTRIL Take 1 tablet (10 mg total) by mouth daily.   metFORMIN 1000 MG tablet Commonly known as: GLUCOPHAGE TAKE ONE TABLET BY MOUTH TWICE A DAY WITH FOOD What changed: Another medication with the same name was removed. Continue taking this medication, and follow the directions you see here. Changed by: Fransisca Kaufmann Hailyn Zarr, MD   metoprolol tartrate 50 MG tablet Commonly known as: LOPRESSOR TAKE ONE TABLET BY MOUTH TWICE DAILY   nitroGLYCERIN 0.4 MG SL tablet Commonly known as: NITROSTAT Place 1 tablet (0.4 mg total) under the tongue every 5 (five) minutes as needed for chest pain.   Ozempic (  0.25 or 0.5 MG/DOSE) 2 MG/1.5ML Sopn Generic drug: Semaglutide(0.25 or 0.5MG/DOS) Inject 0.5 mg into the skin once a week. Started by: Worthy Rancher, MD   pantoprazole 40 MG tablet Commonly known as: PROTONIX Take 1 tablet (40 mg total) by mouth 2 (two) times daily.   pravastatin 80 MG tablet Commonly known as: PRAVACHOL TAKE 1 TABLET DAILY IN THE EVENING        Objective:   BP 138/67   Pulse 69   Temp (!) 97.3 F (36.3 C)   Ht 5' 2"  (1.575 m)   Wt 155 lb (70.3 kg)   SpO2 97%   BMI 28.35 kg/m   Wt Readings from Last 3 Encounters:  03/23/20 155 lb  (70.3 kg)  03/21/20 164 lb (74.4 kg)  03/17/20 165 lb (74.8 kg)    Physical Exam Vitals and nursing note reviewed.  Constitutional:      General: She is not in acute distress.    Appearance: She is well-developed and well-nourished. She is not diaphoretic.  Eyes:     Extraocular Movements: EOM normal.     Conjunctiva/sclera: Conjunctivae normal.  Cardiovascular:     Rate and Rhythm: Normal rate and regular rhythm.     Pulses: Intact distal pulses.     Heart sounds: Normal heart sounds. No murmur heard.   Pulmonary:     Effort: Pulmonary effort is normal. No respiratory distress.     Breath sounds: Normal breath sounds. No wheezing.  Musculoskeletal:        General: No edema.  Skin:    General: Skin is warm and dry.     Findings: No rash.  Neurological:     Mental Status: She is alert and oriented to person, place, and time.     Coordination: Coordination normal.  Psychiatric:        Mood and Affect: Mood and affect normal.        Behavior: Behavior normal.       Assessment & Plan:   Problem List Items Addressed This Visit      Endocrine   Type 2 diabetes mellitus with hyperglycemia (HCC)   Relevant Medications   Semaglutide,0.25 or 0.5MG/DOS, (OZEMPIC, 0.25 OR 0.5 MG/DOSE,) 2 MG/1.5ML SOPN   Other Relevant Orders   BMP8+EGFR   Diabetes mellitus ([place]) - Primary   Relevant Medications   Semaglutide,0.25 or 0.5MG/DOS, (OZEMPIC, 0.25 OR 0.5 MG/DOSE,) 2 MG/1.5ML SOPN   Other Relevant Orders   BMP8+EGFR   Bayer DCA Hb A1c Waived   BMP8+EGFR    Other Visit Diagnoses    Need for immunization against influenza       Relevant Orders   Flu Vaccine QUAD High Dose(Fluad) (Completed)      Will start Ozempic, continue Metformin, gave sample for Trulicity because we did not any samples for Ozempic but she can start with that and then work her way up to the [place]. Follow up plan: Return in about 2 months (around 05/24/2020), or if symptoms worsen or fail to improve, for  1 week follow-up with [facility], also scheduled diabetes follow-up with PCP in 2 months.  Counseling provided for all of the vaccine components Orders Placed This Encounter  Procedures  . Flu Vaccine QUAD High Dose(Fluad)  . Bayer DCA Hb A1c Waived  . BMP8+EGFR    Caryl Pina, MD [facility] 03/23/2020, 11:04 AM

## 2020-04-05 ENCOUNTER — Ambulatory Visit (INDEPENDENT_AMBULATORY_CARE_PROVIDER_SITE_OTHER): Payer: Medicare Other | Admitting: Pharmacist

## 2020-04-05 DIAGNOSIS — E119 Type 2 diabetes mellitus without complications: Secondary | ICD-10-CM

## 2020-04-05 NOTE — Progress Notes (Signed)
    04/05/2020 Name: Rhonda Garcia MRN: [id number] DOB: Jun 19, 1940   S:  70 yoF Presents for diabetes evaluation, education, and management Patient was referred and last seen by Primary Care Provider on 03/23/20.  Insurance coverage/medication affordability: BCBS medicare  Patient reports adherence with medications. . Current diabetes medications include: metformin, trulicity samples . Current hypertension medications include: imdur, LISINOPRIL, metop Goal 130/80 . Current hyperlipidemia medications include: pravastatin  Patient denies hypoglycemic events.   Patient reported dietary habits: Eats 2-3 meals/day  . Breakfast:toast w/ peanut butter, special K with milk  . Lunch: snacks  . Dinner: green beans, corn, [place] chicken, fish  . Snacks: PB butter and crackers, apples  . Drinks: water, mtn dew (only one/day)  Patient-reported exercise habits: n/a  O:  Lab Results  Component Value Date   HGBA1C 10.7 (H) 03/23/2020    Lipid Panel     Component Value Date/Time   CHOL 118 10/22/2019 0934   CHOL 181 10/28/2012 1208   TRIG 86 10/22/2019 0934   TRIG 67 08/13/2013 1154   TRIG 97 10/28/2012 1208   HDL 42 10/22/2019 0934   HDL 43 08/13/2013 1154   HDL 50 10/28/2012 1208   CHOLHDL 2.8 10/22/2019 0934   CHOLHDL 3.8 08/06/2015 0200   VLDL 21 08/06/2015 0200   LDLCALC 59 10/22/2019 0934   LDLCALC 58 08/13/2013 1154   LDLCALC 112 (H) 10/28/2012 1208    Home fasting blood sugars: 155-160, 183  2 hour post-meal/random blood sugars: n/a   A/P:  Diabetes T2DM currently uncontrolled. Patient is adherent with medication. Control is suboptimal due to diet/lifestyle/medication optimization continues.  -Start Ozempic 0.5mg  sq weekly (has been using trulicity samples)  -Continue Metformin  -Encouraged patient to cut back on sodas (mtn dew)  -Extensively discussed pathophysiology of diabetes, recommended lifestyle interventions, dietary effects on blood sugar  control  -Counseled on s/sx of and management of hypoglycemia  -Next A1C anticipated 05/26/20.  Written patient instructions provided.  Total time in counseling 25 minutes.   Follow up PCP Clinic Visit ON 05/26/20.    Kieth Brightly, PharmD, BCPS Clinical Pharmacist, [facility]  II Phone [phone]

## 2020-04-06 ENCOUNTER — Encounter: Payer: Self-pay | Admitting: Family Medicine

## 2020-04-06 ENCOUNTER — Ambulatory Visit (INDEPENDENT_AMBULATORY_CARE_PROVIDER_SITE_OTHER): Payer: Medicare Other | Admitting: Family Medicine

## 2020-04-06 ENCOUNTER — Other Ambulatory Visit: Payer: Self-pay

## 2020-04-06 VITALS — BP 154/74 | HR 75 | Temp 97.4°F | Ht 62.0 in | Wt 160.0 lb

## 2020-04-06 DIAGNOSIS — R059 Cough, unspecified: Secondary | ICD-10-CM

## 2020-04-06 DIAGNOSIS — J329 Chronic sinusitis, unspecified: Secondary | ICD-10-CM | POA: Diagnosis not present

## 2020-04-06 DIAGNOSIS — J40 Bronchitis, not specified as acute or chronic: Secondary | ICD-10-CM

## 2020-04-06 LAB — VERITOR FLU A/B WAIVED
Influenza A: NEGATIVE
Influenza B: NEGATIVE

## 2020-04-06 MED ORDER — AMOXICILLIN-POT CLAVULANATE 875-125 MG PO TABS: 1.0000 | ORAL_TABLET | Freq: Two times a day (BID) | ORAL | 0 refills | Status: DC

## 2020-04-06 NOTE — Progress Notes (Signed)
Chief Complaint  Patient presents with  . Cough    Productive cough worse at night symptoms started on 12/23  . Chills    HPI  Patient presents today for Patient presents with upper respiratory congestion. Rhinorrhea that is frequently purulent. There is moderate sore throat. Patient reports coughing frequently as well.  yellow sputum noted. There is no fever, chills or sweats. The patient denies being short of breath. Onset was 3-5 days ago. Gradually worsening. Tried OTCs without improvement.  PMH: Smoking status noted ROS: Per HPI  Objective: BP (!) 154/74   Pulse 75   Temp (!) 97.4 F (36.3 C) (Temporal)   Ht 5\' 2"  (1.575 m)   Wt 160 lb (72.6 kg)   SpO2 98%   BMI 29.26 kg/m  Gen: NAD, alert, cooperative with exam HEENT: NCAT, Nasal passages swollen, red TMS RED CV: RRR, good S1/S2, no murmur Resp: Bronchitis changes with scattered wheezes, non-labored Ext: No edema, warm Neuro: Alert and oriented, No gross deficits  Assessment and plan:  1. Sinobronchitis   2. Cough     Meds ordered this encounter  Medications  . amoxicillin-clavulanate (AUGMENTIN) 875-125 MG tablet    Sig: Take 1 tablet by mouth 2 (two) times daily. Take all of this medication    Dispense:  20 tablet    Refill:  0    Orders Placed This Encounter  Procedures  . Novel Coronavirus, NAA ([facility])    Order Specific Question:   Is this test for diagnosis or screening    Answer:   Diagnosis of ill patient    Order Specific Question:   Symptomatic for COVID-19 as defined by CDC    Answer:   Yes    Order Specific Question:   Date of Symptom Onset    Answer:   03/31/2020    Order Specific Question:   Hospitalized for COVID-19    Answer:   No    Order Specific Question:   Admitted to ICU for COVID-19    Answer:   No    Order Specific Question:   Previously tested for COVID-19    Answer:   Yes    Order Specific Question:   Resident in a congregate (group) care setting    Answer:   No    Order  Specific Question:   Is the patient student?    Answer:   No    Order Specific Question:   Employed in healthcare setting    Answer:   No    Order Specific Question:   Pregnant    Answer:   No    Order Specific Question:   Has patient completed COVID vaccination(s) (2 doses of Pfizer/Moderna 1 dose of 04/02/2020)    Answer:   Yes  . Veritor Flu A/B Waived    Order Specific Question:   Source    Answer:   cough    Follow up as needed.  [employer], MD

## 2020-04-07 ENCOUNTER — Telehealth: Payer: Self-pay | Admitting: *Deleted

## 2020-04-07 DIAGNOSIS — E119 Type 2 diabetes mellitus without complications: Secondary | ICD-10-CM

## 2020-04-07 NOTE — Telephone Encounter (Signed)
Rhonda Garcia (Key: [id number]) Rx #: [id number] Ozempic (0.25 or 0.5 MG/DOSE) 2MG /1.5ML pen-injectors   Therapy changed - med d/c from med list

## 2020-04-08 LAB — NOVEL CORONAVIRUS, NAA: SARS-CoV-2, NAA: NOT DETECTED

## 2020-04-08 LAB — SARS-COV-2, NAA 2 DAY TAT

## 2020-04-10 NOTE — Progress Notes (Signed)
Hello Rhonda Garcia,  Your lab result is normal and/or stable.Some minor variations that are not significant are commonly marked abnormal, but do not represent any medical problem for you.  Best regards, Mechele Claude, M.D.

## 2020-04-12 ENCOUNTER — Telehealth: Payer: Self-pay

## 2020-04-14 NOTE — Telephone Encounter (Signed)
Lmtcb.

## 2020-04-19 ENCOUNTER — Other Ambulatory Visit: Payer: Self-pay | Admitting: Family

## 2020-04-19 DIAGNOSIS — K219 Gastro-esophageal reflux disease without esophagitis: Secondary | ICD-10-CM

## 2020-04-19 MED ORDER — OZEMPIC (0.25 OR 0.5 MG/DOSE) 2 MG/1.5ML ~~LOC~~ SOPN: 0.2500 mg | PEN_INJECTOR | SUBCUTANEOUS | 3 refills | Status: DC

## 2020-05-04 ENCOUNTER — Ambulatory Visit (INDEPENDENT_AMBULATORY_CARE_PROVIDER_SITE_OTHER): Payer: Medicare Other | Admitting: Nurse Practitioner

## 2020-05-04 ENCOUNTER — Other Ambulatory Visit: Payer: Self-pay

## 2020-05-04 ENCOUNTER — Encounter: Payer: Self-pay | Admitting: Nurse Practitioner

## 2020-05-04 VITALS — BP 123/65 | HR 94 | Temp 97.4°F | Ht 62.0 in | Wt 172.6 lb

## 2020-05-04 DIAGNOSIS — M549 Dorsalgia, unspecified: Secondary | ICD-10-CM | POA: Diagnosis not present

## 2020-05-04 DIAGNOSIS — M5430 Sciatica, unspecified side: Secondary | ICD-10-CM

## 2020-05-04 MED ORDER — PREDNISONE 10 MG (21) PO TBPK: ORAL_TABLET | ORAL | 0 refills | Status: DC

## 2020-05-04 MED ORDER — METHYLPREDNISOLONE ACETATE 40 MG/ML IJ SUSP
40.0000 mg | Freq: Once | INTRAMUSCULAR | Status: AC
  Administered 2020-05-04: 40 mg via INTRAMUSCULAR

## 2020-05-04 MED ORDER — DICLOFENAC SODIUM 1 % EX GEL: 2.0000 g | Freq: Four times a day (QID) | CUTANEOUS | 4 refills | Status: DC

## 2020-05-04 MED ORDER — ACETAMINOPHEN 500 MG PO TABS: 500.0000 mg | ORAL_TABLET | Freq: Four times a day (QID) | ORAL | 0 refills | Status: DC | PRN

## 2020-05-04 NOTE — Patient Instructions (Signed)
Acute Back Pain, Adult Acute back pain is sudden and usually short-lived. It is often caused by an injury to the muscles and tissues in the back. The injury may result from:  A muscle or ligament getting overstretched or torn (strained). Ligaments are tissues that connect bones to each other. Lifting something improperly can cause a back strain.  Wear and tear (degeneration) of the spinal disks. Spinal disks are circular tissue that provide cushioning between the bones of the spine (vertebrae).  Twisting motions, such as while playing sports or doing yard work.  A hit to the back.  Arthritis. You may have a physical exam, lab tests, and imaging tests to find the cause of your pain. Acute back pain usually goes away with rest and home care. Follow these instructions at home: Managing pain, stiffness, and swelling  Treatment may include medicines for pain and inflammation that are taken by mouth or applied to the skin, prescription pain medicine, or muscle relaxants. Take over-the-counter and prescription medicines only as told by your health care provider.  Your health care provider may recommend applying ice during the first 24-48 hours after your pain starts. To do this: ? Put ice in a plastic bag. ? Place a towel between your skin and the bag. ? Leave the ice on for 20 minutes, 2-3 times a day.  If directed, apply heat to the affected area as often as told by your health care provider. Use the heat source that your health care provider recommends, such as a moist heat pack or a heating pad. ? Place a towel between your skin and the heat source. ? Leave the heat on for 20-30 minutes. ? Remove the heat if your skin turns bright red. This is especially important if you are unable to feel pain, heat, or cold. You have a greater risk of getting burned. Activity  Do not stay in bed. Staying in bed for more than 1-2 days can delay your recovery.  Sit up and stand up straight. Avoid leaning  forward when you sit or hunching over when you stand. ? If you work at a desk, sit close to it so you do not need to lean over. Keep your chin tucked in. Keep your neck drawn back, and keep your elbows bent at a 90-degree angle (right angle). ? Sit high and close to the steering wheel when you drive. Add lower back (lumbar) support to your car seat, if needed.  Take short walks on even surfaces as soon as you are able. Try to increase the length of time you walk each day.  Do not sit, drive, or stand in one place for more than 30 minutes at a time. Sitting or standing for long periods of time can put stress on your back.  Do not drive or use heavy machinery while taking prescription pain medicine.  Use proper lifting techniques. When you bend and lift, use positions that put less stress on your back: ? Bend your knees. ? Keep the load close to your body. ? Avoid twisting.  Exercise regularly as told by your health care provider. Exercising helps your back heal faster and helps prevent back injuries by keeping muscles strong and flexible.  Work with a physical therapist to make a safe exercise program, as recommended by your health care provider. Do any exercises as told by your physical therapist.   Lifestyle  Maintain a healthy weight. Extra weight puts stress on your back and makes it difficult to have   good posture.  Avoid activities or situations that make you feel anxious or stressed. Stress and anxiety increase muscle tension and can make back pain worse. Learn ways to manage anxiety and stress, such as through exercise. General instructions  Sleep on a firm mattress in a comfortable position. Try lying on your side with your knees slightly bent. If you lie on your back, put a pillow under your knees.  Follow your treatment plan as told by your health care provider. This may include: ? Cognitive or behavioral therapy. ? Acupuncture or massage therapy. ? Meditation or yoga. Contact  a health care provider if:  You have pain that is not relieved with rest or medicine.  You have increasing pain going down into your legs or buttocks.  Your pain does not improve after 2 weeks.  You have pain at night.  You lose weight without trying.  You have a fever or chills. Get help right away if:  You develop new bowel or bladder control problems.  You have unusual weakness or numbness in your arms or legs.  You develop nausea or vomiting.  You develop abdominal pain.  You feel faint. Summary  Acute back pain is sudden and usually short-lived.  Use proper lifting techniques. When you bend and lift, use positions that put less stress on your back.  Take over-the-counter and prescription medicines and apply heat or ice as directed by your health care provider. This information is not intended to replace advice given to you by your health care provider. Make sure you discuss any questions you have with your health care provider. Document Revised: 12/18/2019 Document Reviewed: 12/18/2019 Elsevier Patient Education  2021 Elsevier Inc.  

## 2020-05-04 NOTE — Progress Notes (Signed)
Acute Office Visit  Subjective:    Patient ID: Rhonda Garcia, female    DOB: 01-Nov-1940, 80 y.o.   MRN: [id number]  Chief Complaint  Patient presents with  . Back Pain    X 2 weeks, radiates into buttock    Back Pain This is a new problem. The current episode started 1 to 4 weeks ago. The problem occurs intermittently. The problem is unchanged. The quality of the pain is described as aching and shooting. The pain is at a severity of 8/10. The pain is severe. The symptoms are aggravated by bending, sitting, twisting and position. Associated symptoms include leg pain. Pertinent negatives include no abdominal pain, dysuria, fever, headaches, numbness, paresthesias, pelvic pain or tingling. She has tried muscle relaxant for the symptoms. The treatment provided mild relief.   Past Medical History:  Diagnosis Date  . A-fib ([place])   . Anal fissure    resolved   . Anemia   . Back pain    with left radiculopathy  . CAD (coronary artery disease)   . Cataract   . Collagen vascular disease ([place])   . DDD (degenerative disc disease)   . Depression   . Diabetes mellitus   . Diverticulosis   . Dyslipidemia   . Dysrhythmia    AFib  . Esophagitis   . GERD (gastroesophageal reflux disease)   . Hiatal hernia   . Hx of peptic ulcer   . Hyperlipidemia   . Hypertension   . Hypothyroidism   . Internal hemorrhoids 2017  . NSTEMI (non-ST elevated myocardial infarction) ([place]) 08/05/2015  . Osteopenia   . Sleep apnea    not using CPAP now, could not tolerate and PCP aware.  . Thyroid disease     Past Surgical History:  Procedure Laterality Date  . ABDOMINAL HYSTERECTOMY Bilateral 2001 - approximate  . APPENDECTOMY    . BREAST REDUCTION SURGERY    . CARDIAC CATHETERIZATION N/A 08/08/2015   Procedure: Left Heart Cath and Coronary Angiography;  Surgeon: Burnell Blanks, MD;  Location: [facility];  Service: Cardiovascular;  Laterality: N/A;  . CHOLECYSTECTOMY  2008 - approximate   . COLONOSCOPY  06/2007   Friable anal canal and anal papillae. Pancolonic diverticula. Normal terminal ileum. Status post segmental biopsy, descending colon biopsy showed minimal cryptitis. Biopsies felt to be  nonspecific but could be seen with NSAIDs. No features of inflammatory bowel disease. Stool studies were negative.  . COLONOSCOPY  03/21/2011   RMR: colonic polyps treated as described above, colonic diverticulosis (Tubular adenomas)  . COLONOSCOPY WITH PROPOFOL N/A 06/17/2014   RMR: Colonic diverticulosis. Multiple colonic polyps removed as described above.   . COLONOSCOPY WITH PROPOFOL N/A 03/19/2016   Procedure: COLONOSCOPY WITH PROPOFOL;  Surgeon: Daneil Dolin, MD;  [facility];  Service: Endoscopy;  Laterality: N/A;  8:45am  . COLONOSCOPY WITH PROPOFOL N/A 10/02/2018   Procedure: COLONOSCOPY WITH PROPOFOL;  Surgeon: Daneil Dolin, MD;  [facility];  Service: Endoscopy;  Laterality: N/A;  9:15am  . CORONARY ANGIOPLASTY WITH STENT PLACEMENT     4 stents  . ESOPHAGEAL DILATION N/A 06/17/2014   Procedure: ESOPHAGEAL DILATION;  Surgeon: Daneil Dolin, MD;  Location: [facility];  Service: Endoscopy;  Laterality: N/A;  Maloney 78 Fr  . ESOPHAGOGASTRODUODENOSCOPY  06/2007   Small hiatal hernia  . ESOPHAGOGASTRODUODENOSCOPY  03/21/2011   RMR: normal esophagus- status post passage of Maloney dilator. Small hiatal hernia. Trivial antral and bulbar erosions  .  ESOPHAGOGASTRODUODENOSCOPY (EGD) WITH PROPOFOL N/A 06/17/2014   RMR: Small hiatal hernia; otherwise normal EGD. Status post passage of a Maloney dilator. No explanation for patients right upper quadrant abdominal pain.   Marland Kitchen ESOPHAGOGASTRODUODENOSCOPY (EGD) WITH PROPOFOL N/A 03/19/2016   Procedure: ESOPHAGOGASTRODUODENOSCOPY (EGD) WITH PROPOFOL;  Surgeon: Daneil Dolin, MD;  [facility];  Service: Endoscopy;  Laterality: N/A;  . KNEE SURGERY Left    arthroscopy  . LIPOMA EXCISION  03/17/2012   Procedure:  EXCISION LIPOMA;  Surgeon: Gwenyth Ober, MD;  Location: [facility];  Service: General;  Laterality: Right;  excision of right lower back lipoma  . MALONEY DILATION N/A 03/19/2016   Procedure: Venia Minks DILATION;  Surgeon: Daneil Dolin, MD;  [facility];  Service: Endoscopy;  Laterality: N/A;  . POLYPECTOMY  06/17/2014   Procedure: POLYPECTOMY;  Surgeon: Daneil Dolin, MD;  Location[facility];  Service: Endoscopy;;  . POLYPECTOMY  10/02/2018   Procedure: POLYPECTOMY;  Surgeon: Daneil Dolin, MD;  [facility];  Service: Endoscopy;;  colon  . REDUCTION MAMMAPLASTY Bilateral     Family History  Problem Relation Age of Onset  . Heart attack Mother   . Hypertension Mother   . Stroke Mother        Deceased, age 27  . Diabetes Sister   . Diabetes Sister   . Cancer Sister        ovarian  . Stroke Sister   . Diabetes Brother        also has a blood disorder   . Clotting disorder Brother   . Kidney disease Brother   . Heart disease Brother   . Melanoma Brother        deceased  . Coronary artery disease Other   . Kidney disease Other   . Diabetes Son   . Hypertension Son   . Coronary artery disease Son 58       CABG  . Hypertension Son   . Coronary artery disease Son 77       CABG  . Colon cancer Neg Hx     Social History   Socioeconomic History  . Marital status: Married    Spouse name: Not on file  . Number of children: 4  . Years of education: 6  . Highest education level: 6th grade  Occupational History  . Occupation: Retired    Comment: Charity fundraiser  Tobacco Use  . Smoking status: Never Smoker  . Smokeless tobacco: Never Used  Vaping Use  . Vaping Use: Never used  Substance and Sexual Activity  . Alcohol use: Not Currently    Alcohol/week: 1.0 standard drink    Types: 1 Glasses of wine per week  . Drug use: No  . Sexual activity: Not Currently    Birth control/protection: Post-menopausal  Other Topics Concern  . Not on  file  Social History Narrative    Has 4 adult children. She lives home with one of her sons who recently had a stroke and needs 24 hour care. She is the primary caregiver and is now legally separated from her husband.    Social Determinants of Health   Financial Resource Strain: Low Risk   . Difficulty of Paying Living Expenses: Not hard at all  Food Insecurity: No Food Insecurity  . Worried About Charity fundraiser in the Last Year: Never true  . Ran Out of Food in the Last Year: Never true  Transportation Needs:  No Transportation Needs  . Lack of Transportation (Medical): No  . Lack of Transportation (Non-Medical): No  Physical Activity: Insufficiently Active  . Days of Exercise per Week: 7 days  . Minutes of Exercise per Session: 10 min  Stress: Stress Concern Present  . Feeling of Stress : Rather much  Social Connections: Moderately Integrated  . Frequency of Communication with Friends and Family: More than three times a week  . Frequency of Social Gatherings with Friends and Family: More than three times a week  . Attends Religious Services: More than 4 times per year  . Active Member of Clubs or Organizations: Yes  . Attends Archivist Meetings: More than 4 times per year  . Marital Status: Separated  Intimate Partner Violence: Not At Risk  . Fear of Current or Ex-Partner: No  . Emotionally Abused: No  . Physically Abused: No  . Sexually Abused: No    Outpatient Medications Prior to Visit  Medication Sig Dispense Refill  . ALPRAZolam (XANAX) 0.5 MG tablet Take 1 tablet (0.5 mg total) by mouth at bedtime as needed. 30 tablet 4  . baclofen (LIORESAL) 10 MG tablet Take 1 tablet (10 mg total) by mouth 3 (three) times daily. 30 each 0  . blood glucose meter kit and supplies Dispense based on patient and insurance preference. Use up to four times daily as directed. (FOR ICD-10 E10.9, E11.9). 1 each 0  . busPIRone (BUSPAR) 5 MG tablet TAKE ONE (1) TABLET THREE (3)  TIMES EACH DAY 90 tablet 1  . ELIQUIS 5 MG TABS tablet TAKE ONE TABLET BY MOUTH TWICE DAILY 60 tablet 5  . escitalopram (LEXAPRO) 10 MG tablet Take 1 tablet (10 mg total) by mouth daily. 90 tablet 3  . isosorbide mononitrate (IMDUR) 60 MG 24 hr tablet TAKE ONE (1) TABLET EACH DAY 90 tablet 2  . levothyroxine (SYNTHROID) 50 MCG tablet TAKE ONE (1) TABLET EACH DAY 90 tablet 0  . linaclotide (LINZESS) 72 MCG capsule Take 1 capsule (72 mcg total) by mouth daily before breakfast. 90 capsule 2  . lisinopril (ZESTRIL) 10 MG tablet Take 1 tablet (10 mg total) by mouth daily. 90 tablet 3  . metFORMIN (GLUCOPHAGE) 1000 MG tablet TAKE ONE TABLET BY MOUTH TWICE A DAY WITH FOOD 180 tablet 0  . metoprolol tartrate (LOPRESSOR) 50 MG tablet TAKE ONE TABLET BY MOUTH TWICE DAILY 180 tablet 1  . nitroGLYCERIN (NITROSTAT) 0.4 MG SL tablet Place 1 tablet (0.4 mg total) under the tongue every 5 (five) minutes as needed for chest pain. 20 tablet 1  . pantoprazole (PROTONIX) 40 MG tablet TAKE ONE TABLET BY MOUTH TWICE DAILY 180 tablet 0  . pravastatin (PRAVACHOL) 80 MG tablet TAKE 1 TABLET DAILY IN THE EVENING 90 tablet 1  . Semaglutide,0.25 or 0.5MG/DOS, (OZEMPIC, 0.25 OR 0.5 MG/DOSE,) 2 MG/1.5ML SOPN Inject 0.25 mg into the skin once a week. 1.5 mL 3  . amoxicillin-clavulanate (AUGMENTIN) 875-125 MG tablet Take 1 tablet by mouth 2 (two) times daily. Take all of this medication 20 tablet 0   No facility-administered medications prior to visit.    Allergies  Allergen Reactions  . Promethazine Hcl Anaphylaxis    Review of Systems  Constitutional: Negative for fever.  HENT: Negative.   Respiratory: Negative.   Gastrointestinal: Negative for abdominal pain.  Genitourinary: Negative for dysuria and pelvic pain.  Musculoskeletal: Positive for back pain.  Skin: Negative.   Neurological: Negative for tingling, numbness, headaches and paresthesias.  All other  systems reviewed and are negative.      Objective:     Physical Exam Constitutional:      General: She is awake.     Interventions: Face mask in place.  HENT:     Head: Normocephalic.     Nose: Nose normal.  Eyes:     Conjunctiva/sclera: Conjunctivae normal.  Cardiovascular:     Rate and Rhythm: Normal rate and regular rhythm.     Pulses: Normal pulses.     Heart sounds: Normal heart sounds.  Pulmonary:     Effort: Pulmonary effort is normal.     Breath sounds: Normal breath sounds.  Abdominal:     General: Bowel sounds are normal.  Musculoskeletal:        General: Tenderness present.  Neurological:     Mental Status: She is alert and oriented to person, place, and time.  Psychiatric:        Behavior: Behavior is cooperative.     BP 123/65   Pulse 94   Temp (!) 97.4 F (36.3 C)   Ht 5' 2"  (1.575 m)   Wt 172 lb 9.6 oz (78.3 kg)   SpO2 99%   BMI 31.57 kg/m  Wt Readings from Last 3 Encounters:  05/04/20 172 lb 9.6 oz (78.3 kg)  04/06/20 160 lb (72.6 kg)  03/23/20 155 lb (70.3 kg)    Health Maintenance Due  Topic Date Due  . OPHTHALMOLOGY EXAM  05/17/2017  . FOOT EXAM  12/05/2019  . COVID-19 Vaccine (3 - Booster for Moderna series) 02/03/2020  . DEXA SCAN  03/25/2020    There are no preventive care reminders to display for this patient.   Lab Results  Component Value Date   TSH 0.779 12/05/2018   Lab Results  Component Value Date   WBC 6.0 03/21/2020   HGB 13.2 03/21/2020   HCT 41.6 03/21/2020   MCV 96.1 03/21/2020   PLT 123 (L) 03/21/2020   Lab Results  Component Value Date   NA 140 03/23/2020   K 4.3 03/23/2020   CO2 25 03/23/2020   GLUCOSE 265 (H) 03/23/2020   BUN 15 03/23/2020   CREATININE 0.99 03/23/2020   BILITOT 0.8 10/22/2019   ALKPHOS 100 10/22/2019   AST 18 10/22/2019   ALT 12 10/22/2019   PROT 6.7 10/22/2019   ALBUMIN 4.1 10/22/2019   CALCIUM 9.3 03/23/2020   ANIONGAP 8 03/21/2020   Lab Results  Component Value Date   CHOL 118 10/22/2019   Lab Results  Component Value Date    HDL 42 10/22/2019   Lab Results  Component Value Date   LDLCALC 59 10/22/2019   Lab Results  Component Value Date   TRIG 86 10/22/2019   Lab Results  Component Value Date   CHOLHDL 2.8 10/22/2019   Lab Results  Component Value Date   HGBA1C 10.7 (H) 03/23/2020       Assessment & Plan:   Problem List Items Addressed This Visit      Nervous and Auditory   Back pain with sciatica - Primary    Patient presents with new back pain with sciatica.  Symptoms in the last 2 weeks.  Pain is described as shooting and aching.  Patient is scoring pain is 8/10 from a pain scale of 0/10.  Patient has tried baclofen 3 times daily with mild therapeutic effect.  Started patient on Voltaren gel, prednisone pack, Tylenol, and Depo-Medrol shot in clinic.  Advised patient to follow-up in 2 weeks with  worsening or unresolved symptoms..  Plan is to complete back x-ray and possible referral to orthopedic. Education provided with printed handouts given.  Patient verbalized understanding. Rx sent to pharmacy.      Relevant Medications   diclofenac Sodium (VOLTAREN) 1 % GEL   predniSONE (STERAPRED UNI-PAK 21 TAB) 10 MG (21) TBPK tablet   acetaminophen (TYLENOL) 500 MG tablet       Meds ordered this encounter  Medications  . methylPREDNISolone acetate (DEPO-MEDROL) injection 40 mg  . diclofenac Sodium (VOLTAREN) 1 % GEL    Sig: Apply 2 g topically 4 (four) times daily.    Dispense:  50 g    Refill:  4    Order Specific Question:   Supervising Provider    Answer:   Janora Norlander [[id number]]  . predniSONE (STERAPRED UNI-PAK 21 TAB) 10 MG (21) TBPK tablet    Sig: 6 tablet day 1, 5 tablets day 2, 4 tablets day 3, 3 tablets day 4, 2 tablets day 5, 1 tablet day 6.    Dispense:  1 each    Refill:  0    Order Specific Question:   Supervising Provider    Answer:   Janora Norlander [[id number]]  . acetaminophen (TYLENOL) 500 MG tablet    Sig: Take 1 tablet (500 mg total) by mouth every 6 (six)  hours as needed.    Dispense:  30 tablet    Refill:  0    Order Specific Question:   Supervising Provider    Answer:   Janora Norlander [[id number]]     Ivy Lynn, NP

## 2020-05-04 NOTE — Assessment & Plan Note (Signed)
Patient presents with new back pain with sciatica.  Symptoms in the last 2 weeks.  Pain is described as shooting and aching.  Patient is scoring pain is 8/10 from a pain scale of 0/10.  Patient has tried baclofen 3 times daily with mild therapeutic effect.  Started patient on Voltaren gel, prednisone pack, Tylenol, and Depo-Medrol shot in clinic.  Advised patient to follow-up in 2 weeks with worsening or unresolved symptoms..  Plan is to complete back x-ray and possible referral to orthopedic. Education provided with printed handouts given.  Patient verbalized understanding. Rx sent to pharmacy.

## 2020-05-23 ENCOUNTER — Telehealth: Payer: Self-pay

## 2020-05-26 ENCOUNTER — Ambulatory Visit: Payer: Medicare Other | Admitting: Family

## 2020-06-01 ENCOUNTER — Other Ambulatory Visit: Payer: Self-pay | Admitting: Family

## 2020-06-01 ENCOUNTER — Other Ambulatory Visit: Payer: Self-pay | Admitting: Cardiology

## 2020-06-01 DIAGNOSIS — K219 Gastro-esophageal reflux disease without esophagitis: Secondary | ICD-10-CM

## 2020-06-16 ENCOUNTER — Other Ambulatory Visit: Payer: Self-pay

## 2020-06-16 ENCOUNTER — Ambulatory Visit: Payer: Medicare Other | Admitting: Orthopaedic Surgery

## 2020-07-08 DIAGNOSIS — H903 Sensorineural hearing loss, bilateral: Secondary | ICD-10-CM | POA: Diagnosis not present

## 2020-07-11 ENCOUNTER — Other Ambulatory Visit: Payer: Self-pay | Admitting: Family

## 2020-07-11 DIAGNOSIS — E039 Hypothyroidism, unspecified: Secondary | ICD-10-CM

## 2020-07-22 IMAGING — DX DG LUMBAR SPINE COMPLETE 4+V
5 series · 5 of 5 positions shown · non-contrast
Comparison: CT 01/29/2017

CLINICAL DATA: Low back pain

EXAM:
LUMBAR SPINE - COMPLETE 4+ VIEW

[l-spine ap]
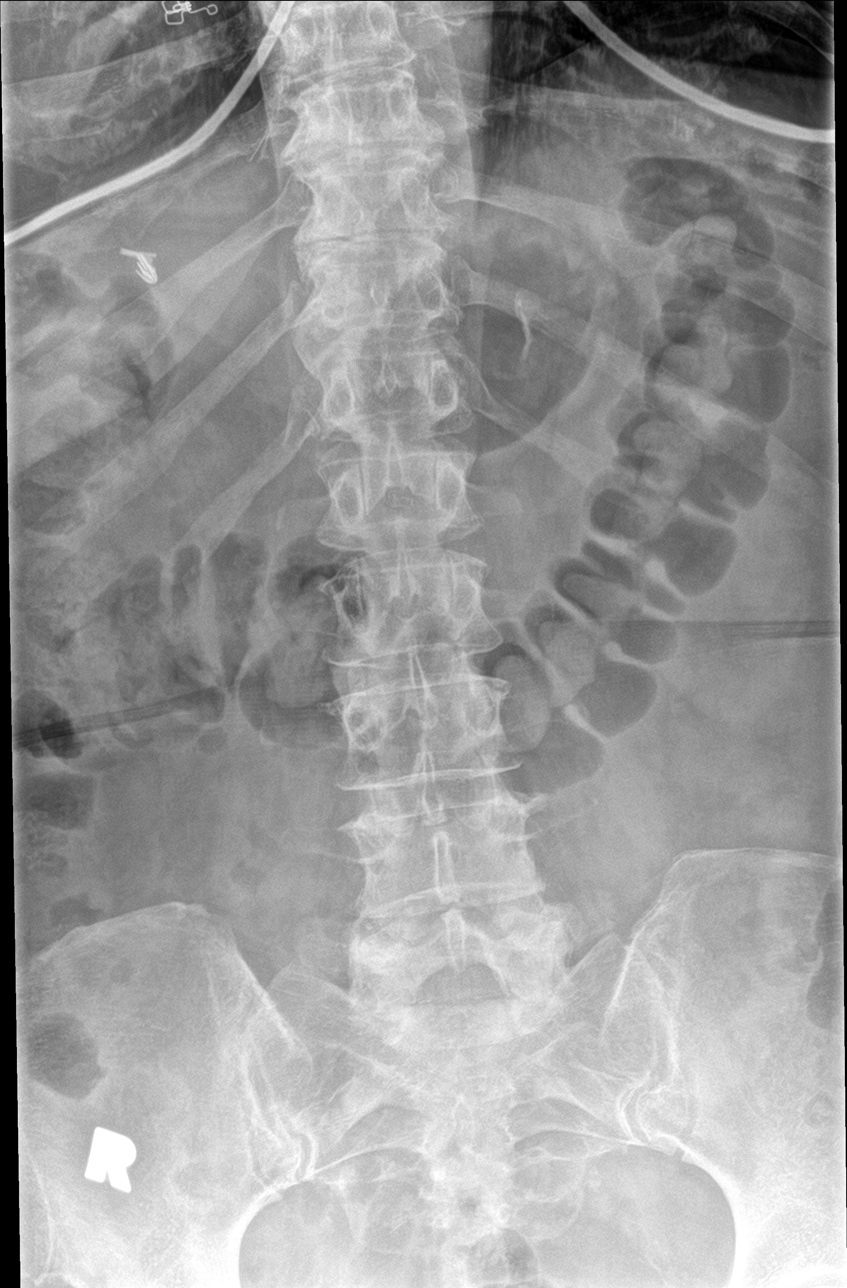

[l-spine obl (1 of 2)]
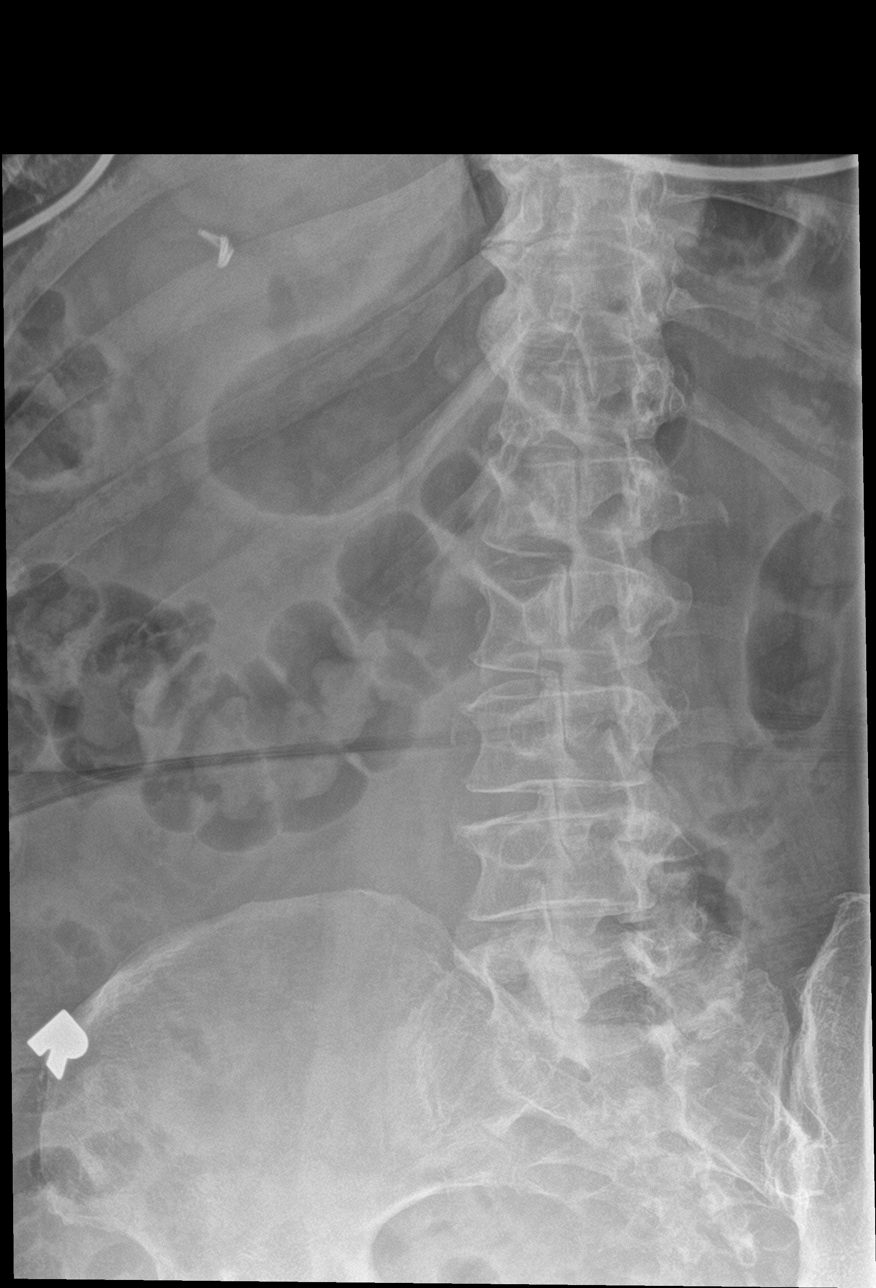

[l-spine obl (2 of 2)]
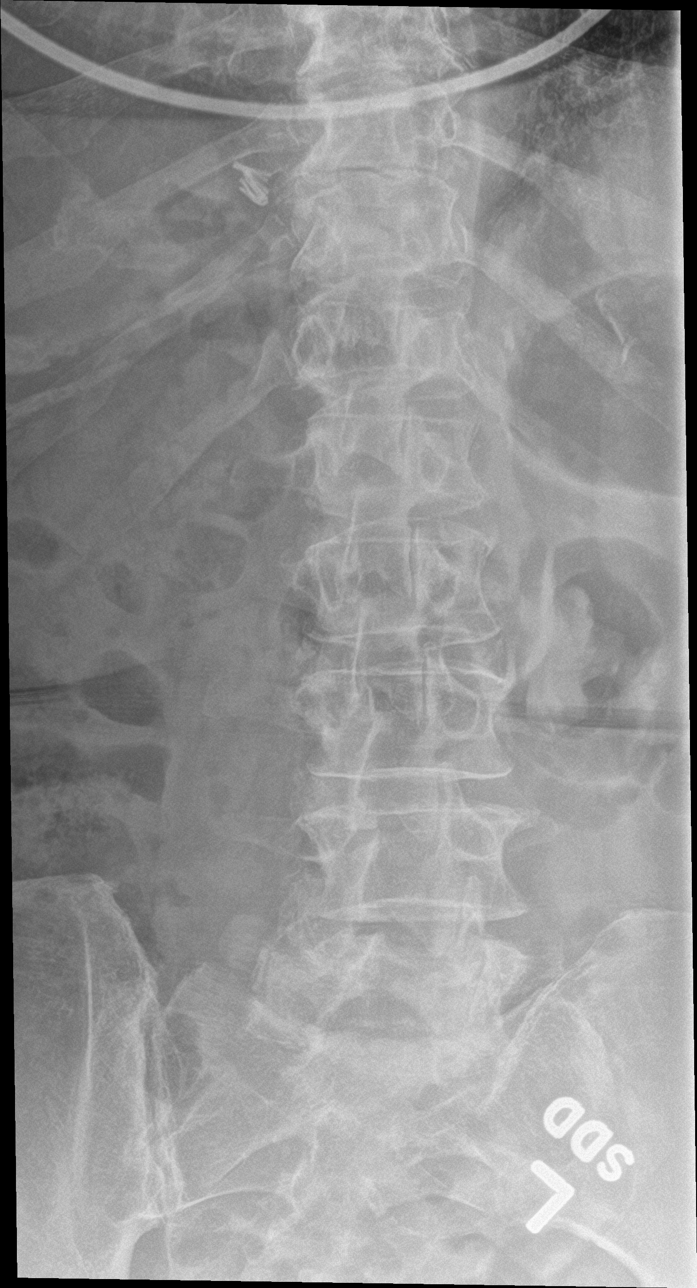

[l-spine lat]
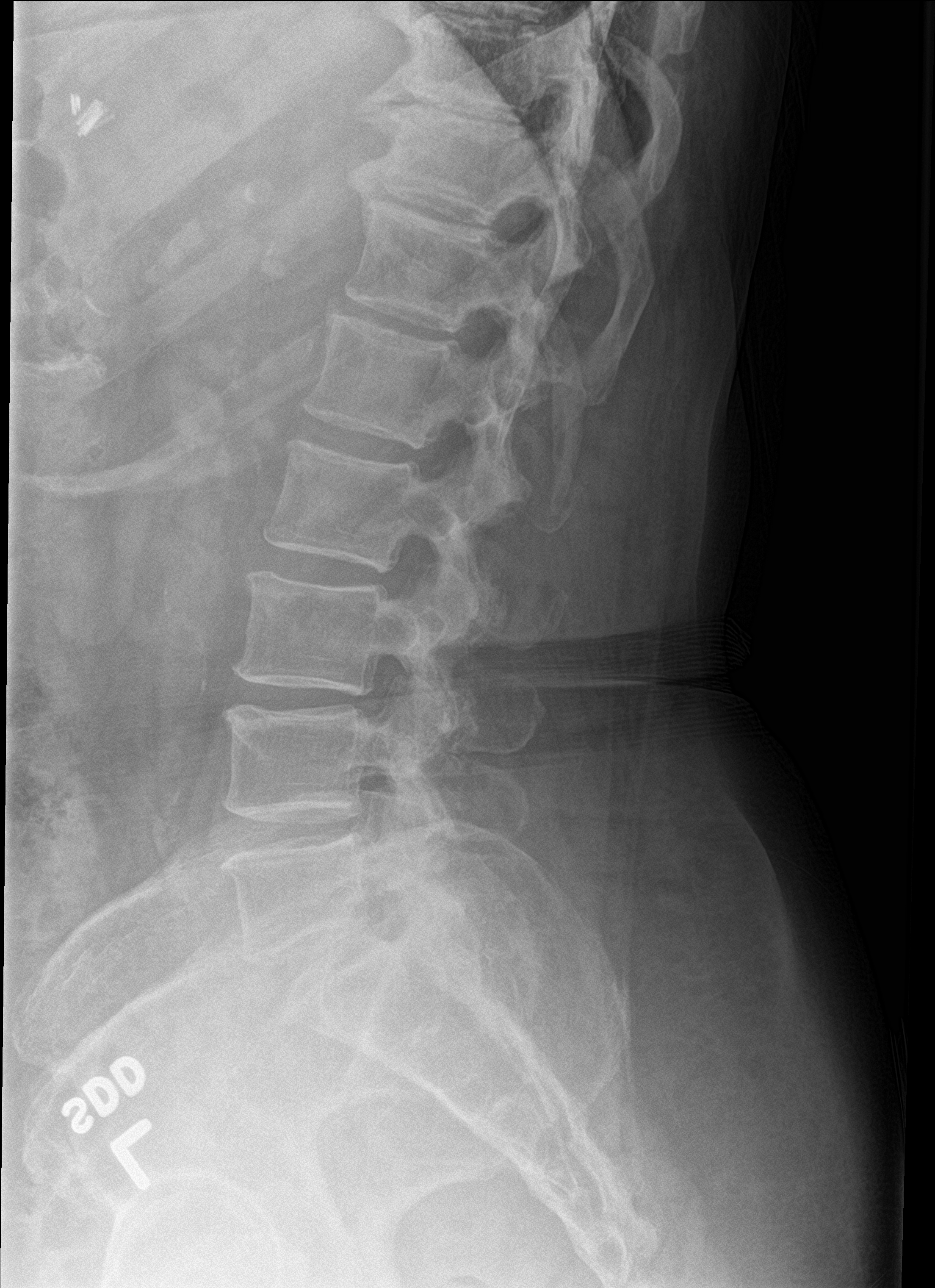

[l-spine spot]
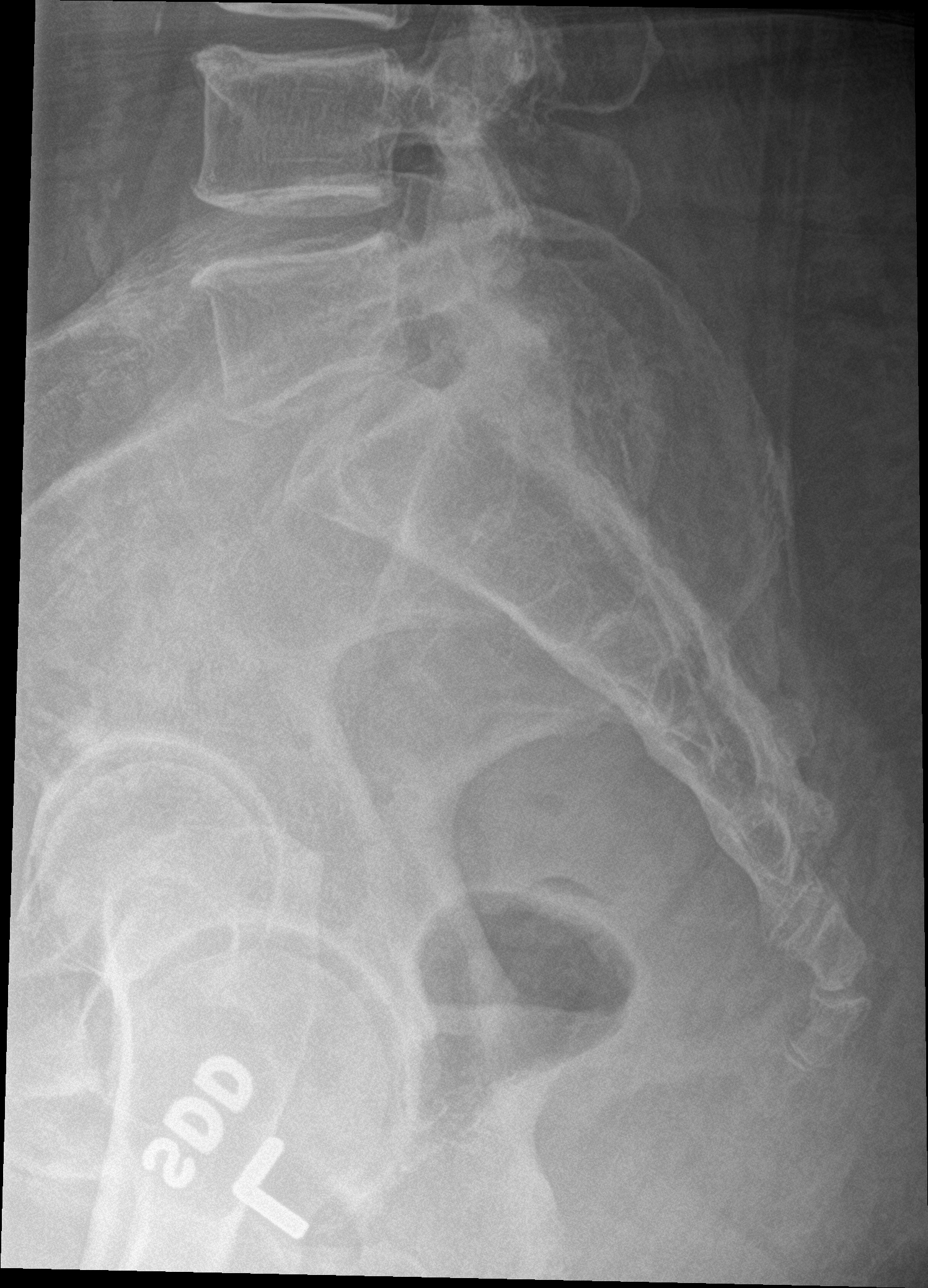

[5 of 5 positions shown; findings below may reference images not displayed]

FINDINGS: Degenerative facet disease in the lumbar spine. Disc spaces
maintained. No fracture or malalignment. SI joints are symmetric and
unremarkable.
IMPRESSION: Degenerative facet disease.  No acute bony abnormality.

## 2020-08-02 NOTE — Progress Notes (Signed)
Cardiology Office Note   Date:  08/03/2020   ID:  Rhonda Garcia, DOB 1940/12/21, MRN [id number]  PCP:  Sharion Balloon, FNP  Cardiologist:   Minus Breeding, MD   Chief Complaint  Patient presents with  . Shortness of Breath      History of Present Illness: Rhonda Garcia is a 80 y.o. female who presents for evaluation of known coronary disease and atrial fibrillation.   Cath for chest pain in 2017 demonstrated nonobstructive disease in large vessels with an occluded PL off the RCA.  She had patent stents in the RI, RCA and LAD.  She was managed medically.  She was at [facility] ED in 2020 with chest pain. he ruled out for MI and she had a negative Lexiscan Myoview.       Since I last saw her she has a sensation that she cannot breath when somebody is hugging her.   She reports that she gets almost panicked.  She had some mild shortness of breath that is increased when she is exercising.  This is like when she is walking to her son's house.  This is somewhat new since previous so difficult to quantify.  She is not having any resting shortness of breath, PND or orthopnea.  She is not reporting any palpitations, presyncope or syncope.  She has had a lot of stress.  Her son died in 2022-12-05.  Her husband had died in Sep 04, 2022.  She is living alone but she is now dating.   Past Medical History:  Diagnosis Date  . A-fib ([place])   . Anal fissure    resolved   . Anemia   . Back pain    with left radiculopathy  . CAD (coronary artery disease)   . Cataract   . Collagen vascular disease ([place])   . DDD (degenerative disc disease)   . Depression   . Diabetes mellitus   . Diverticulosis   . Dyslipidemia   . Dysrhythmia    AFib  . Esophagitis   . GERD (gastroesophageal reflux disease)   . Hiatal hernia   . Hx of peptic ulcer   . Hyperlipidemia   . Hypertension   . Hypothyroidism   . Internal hemorrhoids 2017  . NSTEMI (non-ST elevated myocardial infarction) ([place]) 08/05/2015  . Osteopenia   .  Sleep apnea    not using CPAP now, could not tolerate and PCP aware.  . Thyroid disease     Past Surgical History:  Procedure Laterality Date  . ABDOMINAL HYSTERECTOMY Bilateral 2001 - approximate  . APPENDECTOMY    . BREAST REDUCTION SURGERY    . CARDIAC CATHETERIZATION N/A 08/08/2015   Procedure: Left Heart Cath and Coronary Angiography;  Surgeon: Burnell Blanks, MD;  Location: [place] [facility];  Service: Cardiovascular;  Laterality: N/A;  . CHOLECYSTECTOMY  2008 - approximate  . COLONOSCOPY  06/2007   Friable anal canal and anal papillae. Pancolonic diverticula. Normal terminal ileum. Status post segmental biopsy, descending colon biopsy showed minimal cryptitis. Biopsies felt to be  nonspecific but could be seen with NSAIDs. No features of inflammatory bowel disease. Stool studies were negative.  . COLONOSCOPY  03/21/2011   RMR: colonic polyps treated as described above, colonic diverticulosis (Tubular adenomas)  . COLONOSCOPY WITH PROPOFOL N/A 06/17/2014   RMR: Colonic diverticulosis. Multiple colonic polyps removed as described above.   . COLONOSCOPY WITH PROPOFOL N/A 03/19/2016   Procedure: COLONOSCOPY WITH PROPOFOL;  Surgeon: Daneil Dolin, MD;  [facility];  Service: Endoscopy;  Laterality: N/A;  8:45am  . COLONOSCOPY WITH PROPOFOL N/A 10/02/2018   Procedure: COLONOSCOPY WITH PROPOFOL;  Surgeon: Daneil Dolin, MD;  [facility];  Service: Endoscopy;  Laterality: N/A;  9:15am  . CORONARY ANGIOPLASTY WITH STENT PLACEMENT     4 stents  . ESOPHAGEAL DILATION N/A 06/17/2014   Procedure: ESOPHAGEAL DILATION;  Surgeon: Daneil Dolin, MD;  Location: [facility];  Service: Endoscopy;  Laterality: N/A;  Maloney 45 Fr  . ESOPHAGOGASTRODUODENOSCOPY  06/2007   Small hiatal hernia  . ESOPHAGOGASTRODUODENOSCOPY  03/21/2011   RMR: normal esophagus- status post passage of Maloney dilator. Small hiatal hernia. Trivial antral and bulbar erosions  .  ESOPHAGOGASTRODUODENOSCOPY (EGD) WITH PROPOFOL N/A 06/17/2014   RMR: Small hiatal hernia; otherwise normal EGD. Status post passage of a Maloney dilator. No explanation for patients right upper quadrant abdominal pain.   Marland Kitchen ESOPHAGOGASTRODUODENOSCOPY (EGD) WITH PROPOFOL N/A 03/19/2016   Procedure: ESOPHAGOGASTRODUODENOSCOPY (EGD) WITH PROPOFOL;  Surgeon: Daneil Dolin, MD;  [facility];  Service: Endoscopy;  Laterality: N/A;  . KNEE SURGERY Left    arthroscopy  . LIPOMA EXCISION  03/17/2012   Procedure: EXCISION LIPOMA;  Surgeon: Gwenyth Ober, MD;  Location: [place];  Service: General;  Laterality: Right;  excision of right lower back lipoma  . MALONEY DILATION N/A 03/19/2016   Procedure: Venia Minks DILATION;  Surgeon: Daneil Dolin, MD;  [facility];  Service: Endoscopy;  Laterality: N/A;  . POLYPECTOMY  06/17/2014   Procedure: POLYPECTOMY;  Surgeon: Daneil Dolin, MD;  Location[facility];  Service: Endoscopy;;  . POLYPECTOMY  10/02/2018   Procedure: POLYPECTOMY;  Surgeon: Daneil Dolin, MD;  [facility];  Service: Endoscopy;;  colon  . REDUCTION MAMMAPLASTY Bilateral      Current Outpatient Medications  Medication Sig Dispense Refill  . acetaminophen (TYLENOL) 500 MG tablet Take 1 tablet (500 mg total) by mouth every 6 (six) hours as needed. 30 tablet 0  . ALPRAZolam (XANAX) 0.5 MG tablet Take 1 tablet (0.5 mg total) by mouth at bedtime as needed. 30 tablet 4  . busPIRone (BUSPAR) 5 MG tablet TAKE ONE (1) TABLET THREE (3) TIMES EACH DAY 90 tablet 1  . diclofenac Sodium (VOLTAREN) 1 % GEL Apply 2 g topically 4 (four) times daily. 50 g 4  . ELIQUIS 5 MG TABS tablet TAKE ONE TABLET BY MOUTH TWICE DAILY 60 tablet 5  . escitalopram (LEXAPRO) 10 MG tablet Take 1 tablet (10 mg total) by mouth daily. 90 tablet 3  . isosorbide mononitrate (IMDUR) 60 MG 24 hr tablet TAKE ONE (1) TABLET EACH DAY 90 tablet 1  . levothyroxine (SYNTHROID) 50 MCG tablet  Take 1 tablet (50 mcg total) by mouth daily before breakfast. (NEEDS TO BE SEEN BEFORE NEXT REFILL) 30 tablet 0  . linaclotide (LINZESS) 72 MCG capsule Take 1 capsule (72 mcg total) by mouth daily before breakfast. 90 capsule 2  . lisinopril (ZESTRIL) 10 MG tablet Take 1 tablet (10 mg total) by mouth daily. 90 tablet 3  . metFORMIN (GLUCOPHAGE) 1000 MG tablet TAKE ONE TABLET BY MOUTH TWICE A DAY WITH FOOD 180 tablet 0  . metoprolol tartrate (LOPRESSOR) 50 MG tablet TAKE ONE TABLET BY MOUTH TWICE DAILY 180 tablet 1  . nitroGLYCERIN (NITROSTAT) 0.4 MG SL tablet Place 1 tablet (0.4 mg total) under the tongue every 5 (five) minutes as needed for chest pain. [place]  tablet 1  . pantoprazole (PROTONIX) 40 MG tablet TAKE ONE TABLET BY MOUTH TWICE DAILY 180 tablet 0  . pravastatin (PRAVACHOL) 80 MG tablet TAKE 1 TABLET DAILY IN THE EVENING 90 tablet 1  . blood glucose meter kit and supplies Dispense based on patient and insurance preference. Use up to four times daily as directed. (FOR ICD-10 E10.9, E11.9). 1 each 0  . predniSONE (STERAPRED UNI-PAK 21 TAB) 10 MG (21) TBPK tablet 6 tablet day 1, 5 tablets day 2, 4 tablets day 3, 3 tablets day 4, 2 tablets day 5, 1 tablet day 6. (Patient not taking: Reported on 08/03/2020) 1 each 0   No current facility-administered medications for this visit.    Allergies:   Promethazine hcl   ROS:  Please see the history of present illness.   Otherwise, review of systems are positive for none.   All other systems are reviewed and negative.    PHYSICAL EXAM: VS:  BP 120/64   Pulse 78   Ht _0  (1.575 m)   Wt 175 lb (79.4 kg)   BMI 32.01 kg/m  , BMI Body mass index is 32.01 kg/m. GENERAL:  Well appearing NECK:  No jugular venous distention, waveform within normal limits, carotid upstroke brisk and symmetric, no bruits, no thyromegaly LUNGS:  Clear to auscultation bilaterally CHEST:  Unremarkable HEART:  PMI not displaced or sustained,S1 and S2 within normal limits,  no S3, no S4, no clicks, no rubs, no murmurs ABD:  Flat, positive bowel sounds normal in frequency in pitch, no bruits, no rebound, no guarding, no midline pulsatile mass, no hepatomegaly, no splenomegaly EXT:  2 plus pulses throughout, no edema, no cyanosis no clubbing   EKG:  EKG is ordered today. The ekg ordered today demonstrates sinus rhythm, rate 78, axis within normal limits, intervals within normal limits, low voltage.  No acute ST-T wave changes.   Recent Labs: 10/22/2019: ALT 12 03/21/2020: Hemoglobin 13.2; Platelets 123 03/23/2020: BUN 15; Creatinine, Ser 0.99; Potassium 4.3; Sodium 140    Lipid Panel    Component Value Date/Time   CHOL 118 10/22/2019 0934   CHOL 181 10/28/2012 1208   TRIG 86 10/22/2019 0934   TRIG 67 08/13/2013 1154   TRIG 97 10/28/2012 1208   HDL 42 10/22/2019 0934   HDL 43 08/13/2013 1154   HDL 50 10/28/2012 1208   CHOLHDL 2.8 10/22/2019 0934   CHOLHDL 3.8 08/06/2015 0200   VLDL 21 08/06/2015 0200   LDLCALC 59 10/22/2019 0934   LDLCALC 58 08/13/2013 1154   LDLCALC 112 (H) 10/28/2012 1208      Wt Readings from Last 3 Encounters:  08/03/20 175 lb (79.4 kg)  05/04/20 172 lb 9.6 oz (78.3 kg)  04/06/20 160 lb (72.6 kg)      Other studies Reviewed: Additional studies/ records that were reviewed today include: None. Review of the above records demonstrates:  Please see elsewhere in the note.     ASSESSMENT AND PLAN:  ATRIAL FIB Ms.Rhonda Garcia a CHA2DS2 - VASc score of 5.    She has not had any symptomatic paroxysms of this.  No change in therapy.  She tolerates anticoagulation.   CAD, NATIVE VESSEL She had a negativeLexiscan Myoviewin May 2020.    She has some vague dyspnea.  I would like to try to perform a POET (Plain Old Exercise Treadmill).  This would likely need to be a modified Bruce protocol.  If I cannot accomplish this because she is not able  to then I will probably have to do another nuclear study.   HYPERTENSION,  UNSPECIFIED The blood pressure is controlled.  No change in therapy.  HYPERLIPIDEMIA LDL was 59 in July of last year.  No change in therapy.   Is mildly elevated previously.  She did not tolerate Lipitor but she told me previously she might consider Crestor.  I will have her come back for a lipid profile.    Current medicines are reviewed at length with the patient today.  The patient does not have concerns regarding medicines.  The following changes have been made:  no change  Labs/ tests ordered today include:   Orders Placed This Encounter  Procedures  . EXERCISE TOLERANCE TEST (ETT)  . EKG 12-Lead     Disposition:   FU with 1 year or sooner if needed based on the results of the above testing or further symptoms.   Signed, Minus Breeding, MD  08/03/2020 4:06 PM    [facility]

## 2020-08-03 ENCOUNTER — Encounter: Payer: Self-pay | Admitting: Cardiology

## 2020-08-03 ENCOUNTER — Ambulatory Visit: Payer: Medicare Other | Admitting: Cardiology

## 2020-08-03 ENCOUNTER — Other Ambulatory Visit: Payer: Self-pay

## 2020-08-03 VITALS — BP 120/64 | HR 78 | Ht 62.0 in | Wt 175.0 lb

## 2020-08-03 DIAGNOSIS — I4819 Other persistent atrial fibrillation: Secondary | ICD-10-CM | POA: Diagnosis not present

## 2020-08-03 DIAGNOSIS — E785 Hyperlipidemia, unspecified: Secondary | ICD-10-CM

## 2020-08-03 DIAGNOSIS — I25110 Atherosclerotic heart disease of native coronary artery with unstable angina pectoris: Secondary | ICD-10-CM

## 2020-08-03 DIAGNOSIS — I10 Essential (primary) hypertension: Secondary | ICD-10-CM

## 2020-08-03 NOTE — Patient Instructions (Signed)
Medication Instructions:  The current medical regimen is effective;  continue present plan and medications.  *If you need a refill on your cardiac medications before your next appointment, please call your pharmacy*  Testing/Procedures: Your physician has requested that you have an exercise tolerance test.  This will be completed at [facility].  Check in at the Main Entrance.  Nothing to eat/drink 3 hours before your stress test.  Follow-Up: At [facility], you and your health needs are our priority.  As part of our continuing mission to provide you with exceptional heart care, we have created designated Provider Care Teams.  These Care Teams include your primary Cardiologist (physician) and Advanced Practice Providers (APPs -  Physician Assistants and Nurse Practitioners) who all work together to provide you with the care you need, when you need it.  We recommend signing up for the patient portal called "MyChart".  Sign up information is provided on this After Visit Summary.  MyChart is used to connect with patients for Virtual Visits (Telemedicine).  Patients are able to view lab/test results, encounter notes, upcoming appointments, etc.  Non-urgent messages can be sent to your provider as well.   To learn more about what you can do with MyChart, go to [email or web address].    Your next appointment:   Will be determined based on the results of the above testing or in 1 year.  Thank you for choosing [facility]!!

## 2020-08-09 ENCOUNTER — Other Ambulatory Visit: Payer: Self-pay | Admitting: Family

## 2020-08-09 DIAGNOSIS — E039 Hypothyroidism, unspecified: Secondary | ICD-10-CM

## 2020-08-09 NOTE — Telephone Encounter (Signed)
[place]. NTBS 30 days given 07/11/20

## 2020-08-10 ENCOUNTER — Other Ambulatory Visit: Payer: Self-pay

## 2020-08-10 ENCOUNTER — Ambulatory Visit (HOSPITAL_COMMUNITY)
Admission: RE | Admit: 2020-08-10 | Discharge: 2020-08-10 | Disposition: A | Discharge status: Home / Self Care | Payer: Medicare Other | Source: Ambulatory Visit | Attending: Cardiology | Admitting: Cardiology

## 2020-08-10 DIAGNOSIS — I10 Essential (primary) hypertension: Secondary | ICD-10-CM

## 2020-08-10 DIAGNOSIS — I25110 Atherosclerotic heart disease of native coronary artery with unstable angina pectoris: Secondary | ICD-10-CM | POA: Diagnosis not present

## 2020-08-10 DIAGNOSIS — I4819 Other persistent atrial fibrillation: Secondary | ICD-10-CM

## 2020-08-10 LAB — EXERCISE TOLERANCE TEST
Estimated workload: 4.6 METS
Exercise duration (min): 8 min
Exercise duration (sec): 52 s
MPHR: 141 {beats}/min
Peak HR: 121 {beats}/min
Percent HR: 85 %
RPE: 15
Rest HR: 78 {beats}/min

## 2020-08-11 ENCOUNTER — Telehealth: Payer: Self-pay | Admitting: Nurse Practitioner

## 2020-08-11 DIAGNOSIS — R079 Chest pain, unspecified: Secondary | ICD-10-CM

## 2020-08-11 DIAGNOSIS — I25110 Atherosclerotic heart disease of native coronary artery with unstable angina pectoris: Secondary | ICD-10-CM

## 2020-08-11 NOTE — Telephone Encounter (Signed)
-----   Message from Earvin Hansen, LPN sent at 10/15/5883  9:04 AM EDT ----- Will forward to [place] for [facility]

## 2020-08-11 NOTE — Telephone Encounter (Signed)
Reviewed results of stress test per Dr. Percival Spanish. She verbalized understanding and agreement to have lexiscan myoview scheduled at [facility]. She is aware that someone from scheduling will call her to schedule the test. Reports she continues to have burning chest pain that makes her feel like she is going to vomit. Reviewed ER precautions and advised her to call back with questions. She thanked me for the call.

## 2020-08-18 NOTE — Telephone Encounter (Signed)
Pt has been scheduled 5/16

## 2020-08-22 ENCOUNTER — Ambulatory Visit (HOSPITAL_COMMUNITY)
Admission: RE | Admit: 2020-08-22 | Discharge: 2020-08-22 | Disposition: A | Discharge status: Home / Self Care | Payer: Medicare Other | Source: Ambulatory Visit | Attending: Cardiology | Admitting: Cardiology

## 2020-08-22 ENCOUNTER — Encounter (HOSPITAL_COMMUNITY)
Admission: RE | Admit: 2020-08-22 | Discharge: 2020-08-22 | Disposition: A | Discharge status: Home / Self Care | Payer: Medicare Other | Source: Ambulatory Visit | Attending: Cardiology | Admitting: Cardiology

## 2020-08-22 DIAGNOSIS — R079 Chest pain, unspecified: Secondary | ICD-10-CM | POA: Diagnosis not present

## 2020-08-22 DIAGNOSIS — I25110 Atherosclerotic heart disease of native coronary artery with unstable angina pectoris: Secondary | ICD-10-CM | POA: Insufficient documentation

## 2020-08-22 LAB — NM MYOCAR MULTI W/SPECT W/WALL MOTION / EF
LV dias vol: 37 mL (ref 46–106)
LV sys vol: 28 mL
Peak HR: 100 {beats}/min
RATE: 0.37
Rest HR: 71 {beats}/min
SDS: 5
SRS: 16
SSS: 21
TID: 1.28

## 2020-08-22 MED ORDER — TECHNETIUM TC 99M TETROFOSMIN IV KIT
10.0000 | PACK | Freq: Once | INTRAVENOUS | Status: AC | PRN
  Administered 2020-08-22: 11 via INTRAVENOUS

## 2020-08-22 MED ORDER — TECHNETIUM TC 99M TETROFOSMIN IV KIT
30.0000 | PACK | Freq: Once | INTRAVENOUS | Status: AC | PRN
  Administered 2020-08-22: 31 via INTRAVENOUS

## 2020-08-22 MED ORDER — SODIUM CHLORIDE FLUSH 0.9 % IV SOLN
INTRAVENOUS | Status: AC
  Administered 2020-08-22: 10 mL via INTRAVENOUS
  Filled 2020-08-22: qty 10

## 2020-08-22 MED ORDER — REGADENOSON 0.4 MG/5ML IV SOLN
INTRAVENOUS | Status: AC
  Administered 2020-08-22: 0.4 mg via INTRAVENOUS
  Filled 2020-08-22: qty 5

## 2020-08-30 ENCOUNTER — Ambulatory Visit (INDEPENDENT_AMBULATORY_CARE_PROVIDER_SITE_OTHER): Payer: Medicare Other

## 2020-08-30 ENCOUNTER — Ambulatory Visit: Payer: Medicare Other | Admitting: Family

## 2020-08-30 ENCOUNTER — Other Ambulatory Visit: Payer: Self-pay

## 2020-08-30 ENCOUNTER — Encounter: Payer: Self-pay | Admitting: Family

## 2020-08-30 VITALS — BP 144/75 | HR 80 | Temp 97.0°F | Ht 62.0 in | Wt 169.2 lb

## 2020-08-30 DIAGNOSIS — K219 Gastro-esophageal reflux disease without esophagitis: Secondary | ICD-10-CM | POA: Diagnosis not present

## 2020-08-30 DIAGNOSIS — E1165 Type 2 diabetes mellitus with hyperglycemia: Secondary | ICD-10-CM

## 2020-08-30 DIAGNOSIS — Z780 Asymptomatic menopausal state: Secondary | ICD-10-CM

## 2020-08-30 DIAGNOSIS — M1990 Unspecified osteoarthritis, unspecified site: Secondary | ICD-10-CM

## 2020-08-30 DIAGNOSIS — I152 Hypertension secondary to endocrine disorders: Secondary | ICD-10-CM | POA: Diagnosis not present

## 2020-08-30 DIAGNOSIS — E039 Hypothyroidism, unspecified: Secondary | ICD-10-CM

## 2020-08-30 DIAGNOSIS — R5383 Other fatigue: Secondary | ICD-10-CM

## 2020-08-30 DIAGNOSIS — E1159 Type 2 diabetes mellitus with other circulatory complications: Secondary | ICD-10-CM | POA: Diagnosis not present

## 2020-08-30 DIAGNOSIS — R112 Nausea with vomiting, unspecified: Secondary | ICD-10-CM

## 2020-08-30 DIAGNOSIS — M2550 Pain in unspecified joint: Secondary | ICD-10-CM | POA: Diagnosis not present

## 2020-08-30 LAB — BAYER DCA HB A1C WAIVED: HB A1C (BAYER DCA - WAIVED): 7.4 % — ABNORMAL HIGH (ref <7.0)

## 2020-08-30 MED ORDER — ONDANSETRON HCL 4 MG PO TABS: 4.0000 mg | ORAL_TABLET | Freq: Three times a day (TID) | ORAL | 0 refills | Status: DC | PRN

## 2020-08-30 NOTE — Patient Instructions (Signed)
[employer]'s Current Therapy 2021 (pp. 213-216). [place], [place]: Elsevier.">  Gastroesophageal Reflux Disease, Adult Gastroesophageal reflux (GER) happens when acid from the stomach flows up into the tube that connects the mouth and the stomach (esophagus). Normally, food travels down the esophagus and stays in the stomach to be digested. However, when a person has GER, food and stomach acid sometimes move back up into the esophagus. If this becomes a more serious problem, the person may be diagnosed with a disease called gastroesophageal reflux disease (GERD). GERD occurs when the reflux:  Happens often.  Causes frequent or severe symptoms.  Causes problems such as damage to the esophagus. When stomach acid comes in contact with the esophagus, the acid may cause inflammation in the esophagus. Over time, GERD may create small holes (ulcers) in the lining of the esophagus. What are the causes? This condition is caused by a problem with the muscle between the esophagus and the stomach (lower esophageal sphincter, or LES). Normally, the LES muscle closes after food passes through the esophagus to the stomach. When the LES is weakened or abnormal, it does not close properly, and that allows food and stomach acid to go back up into the esophagus. The LES can be weakened by certain dietary substances, medicines, and medical conditions, including:  Tobacco use.  Pregnancy.  Having a hiatal hernia.  Alcohol use.  Certain foods and beverages, such as coffee, chocolate, onions, and peppermint. What increases the risk? You are more likely to develop this condition if you:  Have an increased body weight.  Have a connective tissue disorder.  Take NSAIDs, such as ibuprofen. What are the signs or symptoms? Symptoms of this condition include:  Heartburn.  Difficult or painful swallowing and the feeling of having a lump in the throat.  A bitter taste in the mouth.  Bad breath and having a large  amount of saliva.  Having an upset or bloated stomach and belching.  Chest pain. Different conditions can cause chest pain. Make sure you see your health care provider if you experience chest pain.  Shortness of breath or wheezing.  Ongoing (chronic) cough or a nighttime cough.  Wearing away of tooth enamel.  Weight loss. How is this diagnosed? This condition may be diagnosed based on a medical history and a physical exam. To determine if you have mild or severe GERD, your health care provider may also monitor how you respond to treatment. You may also have tests, including:  A test to examine your stomach and esophagus with a small camera (endoscopy).  A test that measures the acidity level in your esophagus.  A test that measures how much pressure is on your esophagus.  A barium swallow or modified barium swallow test to show the shape, size, and functioning of your esophagus. How is this treated? Treatment for this condition may vary depending on how severe your symptoms are. Your health care provider may recommend:  Changes to your diet.  Medicine.  Surgery. The goal of treatment is to help relieve your symptoms and to prevent complications. Follow these instructions at home: Eating and drinking  Follow a diet as recommended by your health care provider. This may involve avoiding foods and drinks such as: ? Coffee and tea, with or without caffeine. ? Drinks that contain alcohol. ? Energy drinks and sports drinks. ? Carbonated drinks or sodas. ? Chocolate and cocoa. ? Peppermint and mint flavorings. ? Garlic and onions. ? Horseradish. ? Spicy and acidic foods, including peppers, chili powder,   curry powder, vinegar, hot sauces, and barbecue sauce. ? Citrus fruit juices and citrus fruits, such as oranges, lemons, and limes. ? Tomato-based foods, such as red sauce, chili, salsa, and pizza with red sauce. ? Fried and fatty foods, such as donuts, french fries, potato  chips, and high-fat dressings. ? High-fat meats, such as hot dogs and fatty cuts of red and white meats, such as rib eye steak, sausage, ham, and bacon. ? High-fat dairy items, such as whole milk, butter, and cream cheese.  Eat small, frequent meals instead of large meals.  Avoid drinking large amounts of liquid with your meals.  Avoid eating meals during the 2-3 hours before bedtime.  Avoid lying down right after you eat.  Do not exercise right after you eat.   Lifestyle  Do not use any products that contain nicotine or tobacco. These products include cigarettes, chewing tobacco, and vaping devices, such as e-cigarettes. If you need help quitting, ask your health care provider.  Try to reduce your stress by using methods such as yoga or meditation. If you need help reducing stress, ask your health care provider.  If you are overweight, reduce your weight to an amount that is healthy for you. Ask your health care provider for guidance about a safe weight loss goal.   General instructions  Pay attention to any changes in your symptoms.  Take over-the-counter and prescription medicines only as told by your health care provider. Do not take aspirin, ibuprofen, or other NSAIDs unless your health care provider told you to take these medicines.  Wear loose-fitting clothing. Do not wear anything tight around your waist that causes pressure on your abdomen.  Raise (elevate) the head of your bed about 6 inches (15 cm). You can use a wedge to do this.  Avoid bending over if this makes your symptoms worse.  Keep all follow-up visits. This is important. Contact a health care provider if:  You have: ? New symptoms. ? Unexplained weight loss. ? Difficulty swallowing or it hurts to swallow. ? Wheezing or a persistent cough. ? A hoarse voice.  Your symptoms do not improve with treatment. Get help right away if:  You have sudden pain in your arms, neck, jaw, teeth, or back.  You  suddenly feel sweaty, dizzy, or light-headed.  You have chest pain or shortness of breath.  You vomit and the vomit is green, yellow, or black, or it looks like blood or coffee grounds.  You faint.  You have stool that is red, bloody, or black.  You cannot swallow, drink, or eat. These symptoms may represent a serious problem that is an emergency. Do not wait to see if the symptoms will go away. Get medical help right away. Call your local emergency services (911 in the [place] Do not drive yourself to the hospital. Summary  Gastroesophageal reflux happens when acid from the stomach flows up into the esophagus. GERD is a disease in which the reflux happens often, causes frequent or severe symptoms, or causes problems such as damage to the esophagus.  Treatment for this condition may vary depending on how severe your symptoms are. Your health care provider may recommend diet and lifestyle changes, medicine, or surgery.  Contact a health care provider if you have new or worsening symptoms.  Take over-the-counter and prescription medicines only as told by your health care provider. Do not take aspirin, ibuprofen, or other NSAIDs unless your health care provider told you to do so.  Keep all follow-up   visits as told by your health care provider. This is important. This information is not intended to replace advice given to you by your health care provider. Make sure you discuss any questions you have with your health care provider. Document Revised: 10/05/2019 Document Reviewed: 10/05/2019 Elsevier Patient Education  2021 [employer] Inc.  

## 2020-08-30 NOTE — Progress Notes (Signed)
Subjective:    Patient ID: Rhonda Garcia, female    DOB: [birth year], 80 y.o.   MRN: [id number]  Chief Complaint  Patient presents with  . Nausea  . Emesis  . Fatigue  . Arthritis   Pt presents to the office today with complaints of fatigue and GERD with nausea. She was prescribed prednisone for back pain, but states makes her feel sleepy the next day.   She is followed by Cardiologists and just had a  Stress test on 08/10/20. Emesis   Arthritis Presents for follow-up visit. She complains of pain and joint swelling. The symptoms have been stable. Affected locations include the left MCP, right MCP, left knee and right knee (back). Her pain is at a severity of 8/10. Associated symptoms include fatigue.  Gastroesophageal Reflux She complains of belching, heartburn and nausea. This is a chronic problem. The current episode started more than 1 year ago. The problem occurs frequently. Associated symptoms include fatigue. She has tried a PPI for the symptoms. The treatment provided moderate relief.  Diabetes She presents for her follow-up diabetic visit. She has type 2 diabetes mellitus. Her disease course has been stable. Hypoglycemia symptoms include nervousness/anxiousness. Associated symptoms include fatigue. Pertinent negatives for diabetes include no blurred vision and no foot paresthesias. Symptoms are stable. Diabetic complications include heart disease. Risk factors for coronary artery disease include dyslipidemia, diabetes mellitus, hypertension and sedentary lifestyle. She is following a generally unhealthy diet. Her overall blood glucose range is 140-180 mg/dl.  Hypertension The current episode started more than 1 year ago. The problem has been waxing and waning since onset. The problem is uncontrolled. Associated symptoms include malaise/fatigue. Pertinent negatives include no blurred vision, peripheral edema or shortness of breath. Risk factors for coronary artery disease include  dyslipidemia, diabetes mellitus, obesity and sedentary lifestyle. The current treatment provides moderate improvement. Identifiable causes of hypertension include a thyroid problem.  Thyroid Problem Presents for follow-up visit. Symptoms include anxiety and fatigue. The symptoms have been stable.      Review of Systems  Constitutional: Positive for fatigue and malaise/fatigue.  Eyes: Negative for blurred vision.  Respiratory: Negative for shortness of breath.   Gastrointestinal: Positive for heartburn, nausea and vomiting.  Musculoskeletal: Positive for arthritis and joint swelling.  Psychiatric/Behavioral: The patient is nervous/anxious.   All other systems reviewed and are negative.      Objective:   Physical Exam Vitals reviewed.  Constitutional:      General: She is not in acute distress.    Appearance: She is well-developed. She is obese.  HENT:     Head: Normocephalic and atraumatic.     Right Ear: Tympanic membrane normal.     Left Ear: Tympanic membrane normal.  Eyes:     Pupils: Pupils are equal, round, and reactive to light.  Neck:     Thyroid: No thyromegaly.  Cardiovascular:     Rate and Rhythm: Normal rate and regular rhythm.     Heart sounds: Normal heart sounds. No murmur heard.   Pulmonary:     Effort: Pulmonary effort is normal. No respiratory distress.     Breath sounds: Normal breath sounds. No wheezing.  Abdominal:     General: Bowel sounds are normal. There is no distension.     Palpations: Abdomen is soft.     Tenderness: There is no abdominal tenderness.  Musculoskeletal:        General: No tenderness. Normal range of motion.     Cervical back: Normal  range of motion and neck supple.  Skin:    General: Skin is warm and dry.  Neurological:     Mental Status: She is alert and oriented to person, place, and time.     Cranial Nerves: No cranial nerve deficit.     Deep Tendon Reflexes: Reflexes are normal and symmetric.  Psychiatric:         Behavior: Behavior normal.        Thought Content: Thought content normal.        Judgment: Judgment normal.       BP (!) 153/79   Pulse 80   Temp (!) 97 F (36.1 C) (Temporal)   Ht 5' 2"  (1.575 m)   Wt 169 lb 3.2 oz (76.7 kg)   SpO2 96%   BMI 30.95 kg/m      Assessment & Plan:  Rhonda Garcia comes in today with chief complaint of Nausea, Emesis, Fatigue, and Arthritis   Diagnosis and orders addressed:  1. Hypertension associated with diabetes ([place]) - CMP14+EGFR  2. Type 2 diabetes mellitus with hyperglycemia, without long-term current use of insulin (HCC) - CMP14+EGFR - Bayer DCA Hb A1c Waived  3. Generalized joint pain - Arthritis Panel - CMP14+EGFR  4. Gastroesophageal reflux disease without esophagitis - CMP14+EGFR  5. Hypothyroidism, unspecified type - CMP14+EGFR  6. Osteoarthritis, unspecified osteoarthritis type, unspecified site - CMP14+EGFR  7. Fatigue, unspecified type - CMP14+EGFR - TSH  8. Nausea and vomiting, intractability of vomiting not specified, unspecified vomiting type Zofran as needed - ondansetron (ZOFRAN) 4 MG tablet; Take 1 tablet (4 mg total) by mouth every 8 (eight) hours as needed for nausea or vomiting.  Dispense: 20 tablet; Refill: 0  9. Post-menopausal - DG [facility] DEXA   Labs pending Health Maintenance reviewed Diet and exercise encouraged  Follow up plan:  3 months    Rhonda Dun, FNP

## 2020-08-31 LAB — ARTHRITIS PANEL
Basophils Absolute: 0.1 10*3/uL (ref 0.0–0.2)
Basos: 1 %
EOS (ABSOLUTE): 0.1 10*3/uL (ref 0.0–0.4)
Eos: 2 %
Hematocrit: 38.4 % (ref 34.0–46.6)
Hemoglobin: 12.3 g/dL (ref 11.1–15.9)
Immature Grans (Abs): 0 10*3/uL (ref 0.0–0.1)
Immature Granulocytes: 0 %
Lymphocytes Absolute: 1.7 10*3/uL (ref 0.7–3.1)
Lymphs: 30 %
MCH: 29.4 pg (ref 26.6–33.0)
MCHC: 32 g/dL (ref 31.5–35.7)
MCV: 92 fL (ref 79–97)
Monocytes Absolute: 0.6 10*3/uL (ref 0.1–0.9)
Monocytes: 10 %
Neutrophils Absolute: 3.4 10*3/uL (ref 1.4–7.0)
Neutrophils: 57 %
Platelets: 155 10*3/uL (ref 150–450)
RBC: 4.18 x10E6/uL (ref 3.77–5.28)
RDW: 12.9 % (ref 11.7–15.4)
Rheumatoid fact SerPl-aCnc: 51.5 IU/mL — ABNORMAL HIGH (ref <14.0)
Sed Rate: 27 mm/hr (ref 0–40)
Uric Acid: 6.4 mg/dL (ref 3.1–7.9)
WBC: 5.8 10*3/uL (ref 3.4–10.8)

## 2020-08-31 LAB — CMP14+EGFR
ALT: 13 IU/L (ref 0–32)
AST: 18 IU/L (ref 0–40)
Albumin/Globulin Ratio: 1.8 (ref 1.2–2.2)
Albumin: 4.3 g/dL (ref 3.7–4.7)
Alkaline Phosphatase: 84 IU/L (ref 44–121)
BUN/Creatinine Ratio: 13 (ref 12–28)
BUN: 13 mg/dL (ref 8–27)
Bilirubin Total: 0.7 mg/dL (ref 0.0–1.2)
CO2: 23 mmol/L (ref 20–29)
Calcium: 10.2 mg/dL (ref 8.7–10.3)
Chloride: 104 mmol/L (ref 96–106)
Creatinine, Ser: 1 mg/dL (ref 0.57–1.00)
Globulin, Total: 2.4 g/dL (ref 1.5–4.5)
Glucose: 111 mg/dL — ABNORMAL HIGH (ref 65–99)
Potassium: 4.9 mmol/L (ref 3.5–5.2)
Sodium: 141 mmol/L (ref 134–144)
Total Protein: 6.7 g/dL (ref 6.0–8.5)
eGFR: 57 mL/min/{1.73_m2} — ABNORMAL LOW (ref >59)

## 2020-08-31 LAB — TSH: TSH: 0.82 u[IU]/mL (ref 0.450–4.500)

## 2020-09-01 ENCOUNTER — Other Ambulatory Visit: Payer: Self-pay | Admitting: Family

## 2020-09-01 DIAGNOSIS — R768 Other specified abnormal immunological findings in serum: Secondary | ICD-10-CM

## 2020-09-02 DIAGNOSIS — Z780 Asymptomatic menopausal state: Secondary | ICD-10-CM | POA: Diagnosis not present

## 2020-09-02 DIAGNOSIS — M85832 Other specified disorders of bone density and structure, left forearm: Secondary | ICD-10-CM | POA: Diagnosis not present

## 2020-09-13 ENCOUNTER — Other Ambulatory Visit: Payer: Self-pay | Admitting: Family

## 2020-09-13 ENCOUNTER — Other Ambulatory Visit: Payer: Self-pay | Admitting: Cardiology

## 2020-09-13 DIAGNOSIS — E039 Hypothyroidism, unspecified: Secondary | ICD-10-CM

## 2020-09-13 NOTE — Telephone Encounter (Signed)
66f, 76.7kg, scr 1, lovw/hochrein 08/03/20

## 2020-10-13 ENCOUNTER — Other Ambulatory Visit: Payer: Self-pay | Admitting: Family

## 2020-10-13 DIAGNOSIS — E039 Hypothyroidism, unspecified: Secondary | ICD-10-CM

## 2020-10-19 ENCOUNTER — Ambulatory Visit: Payer: Medicare Other | Admitting: Internal Medicine

## 2020-10-19 ENCOUNTER — Ambulatory Visit: Payer: Self-pay

## 2020-10-19 ENCOUNTER — Other Ambulatory Visit: Payer: Self-pay

## 2020-10-19 ENCOUNTER — Encounter: Payer: Self-pay | Admitting: Internal Medicine

## 2020-10-19 VITALS — BP 131/76 | HR 66 | Ht 61.0 in | Wt 176.2 lb

## 2020-10-19 DIAGNOSIS — M0579 Rheumatoid arthritis with rheumatoid factor of multiple sites without organ or systems involvement: Secondary | ICD-10-CM

## 2020-10-19 DIAGNOSIS — M154 Erosive (osteo)arthritis: Secondary | ICD-10-CM | POA: Insufficient documentation

## 2020-10-19 DIAGNOSIS — M25512 Pain in left shoulder: Secondary | ICD-10-CM | POA: Insufficient documentation

## 2020-10-19 DIAGNOSIS — M79641 Pain in right hand: Secondary | ICD-10-CM

## 2020-10-19 DIAGNOSIS — R768 Other specified abnormal immunological findings in serum: Secondary | ICD-10-CM

## 2020-10-19 DIAGNOSIS — M159 Polyosteoarthritis, unspecified: Secondary | ICD-10-CM | POA: Diagnosis not present

## 2020-10-19 DIAGNOSIS — M79642 Pain in left hand: Secondary | ICD-10-CM

## 2020-10-19 DIAGNOSIS — G8929 Other chronic pain: Secondary | ICD-10-CM

## 2020-10-19 DIAGNOSIS — R7689 Other specified abnormal immunological findings in serum: Secondary | ICD-10-CM

## 2020-10-19 NOTE — Progress Notes (Signed)
Office Visit Note  Patient: Rhonda Garcia             Date of Birth: 12/26/1940           MRN: [id number]             PCP: Sharion Balloon, FNP Referring: Sharion Balloon, FNP Visit Date: 10/19/2020  Subjective:  New Patient (Initial Visit) (Patient complains of generalized joint pain. )   History of Present Illness: Rhonda Garcia is a 80 y.o. female here for positive rheumatoid factor and multiple joint pains.  She reports a history of chronic joint pains present for the past few years but more recently has noticed increasing nodules and joint position deviations visible in her bilateral hands.  She also has pain frequently affecting her bilateral knees these do swell intermittently.  She has noticed occasional catching of the knees denies any falling or giving way.  She feels persistently fatigued and that joint symptoms do interfere with her activity level.  She is used topical Voltaren with small benefit.  She does not take oral NSAIDs due to other medication including chronic anticoagulation on Eliquis and hypertension with lisinopril.  Labs reviewed 08/2020 RF 51.5 Uric acid 6.4 ESR 27 CMP eGFR 57   Activities of Daily Living:  Patient reports morning stiffness for 5 minutes.   Patient Reports nocturnal pain.  Difficulty dressing/grooming: Denies Difficulty climbing stairs: Reports Difficulty getting out of chair: Reports Difficulty using hands for taps, buttons, cutlery, and/or writing: Reports  Review of Systems  Constitutional:  Positive for fatigue.  HENT:  Negative for mouth sores, mouth dryness and nose dryness.   Eyes:  Positive for visual disturbance. Negative for pain, itching and dryness.  Respiratory:  Positive for shortness of breath and difficulty breathing. Negative for cough and hemoptysis.   Cardiovascular:  Negative for chest pain, palpitations and swelling in legs/feet.  Gastrointestinal:  Positive for abdominal pain, constipation, diarrhea and nausea.  Negative for blood in stool.  Endocrine: Negative for increased urination.  Genitourinary:  Negative for painful urination.  Musculoskeletal:  Positive for joint pain, joint pain, joint swelling and morning stiffness. Negative for myalgias, muscle weakness, muscle tenderness and myalgias.  Skin:  Negative for color change, rash and redness.  Allergic/Immunologic: Negative for susceptible to infections.  Neurological:  Positive for dizziness. Negative for numbness, headaches, memory loss and weakness.  Hematological:  Negative for swollen glands.  Psychiatric/Behavioral:  Positive for sleep disturbance. Negative for confusion.    PMFS History:  Patient Active Problem List   Diagnosis Date Noted   Rheumatoid factor positive 10/19/2020   Generalized osteoarthritis of multiple sites 10/19/2020   Pain in left shoulder 10/19/2020   Rheumatoid arthritis with rheumatoid factor of multiple sites without organ or systems involvement ([place]) 10/19/2020   Back pain with sciatica 05/04/2020   Bilateral primary osteoarthritis of knee 01/28/2020   Unintentional weight loss 05/05/2019   Abdominal pain 01/01/2019   Chest pain, rule out acute myocardial infarction 12/19/2018   Diabetes mellitus ([place]) 12/19/2018   History of atrial fibrillation 12/19/2018   Hx of non-ST elevation myocardial infarction (NSTEMI) 12/19/2018   Anemia 07/22/2018   Shortness of breath 01/09/2017   Obese 11/05/2016   Vertigo 06/27/2016   GAD (generalized anxiety disorder) 10/27/2015   Coronary artery disease involving native coronary artery of native heart with unstable angina pectoris ([place])    Hx of adenomatous colonic polyps 06/22/2015   Hypothyroidism 01/17/2015   Vitamin D deficiency 09/09/2014  Hiatal hernia    Dysphagia, pharyngoesophageal phase    History of colonic polyps    Diverticulosis of colon without hemorrhage    Paroxysmal atrial fibrillation ([place]) 11/02/2013   Depression 10/28/2012   Type 2 diabetes  mellitus with hyperglycemia ([place]) 10/28/2012   Lipoma of back 02/12/2012   Constipation 02/28/2011   Esophageal dysphagia 02/28/2011   Gastroesophageal reflux 02/28/2011   POSITIONAL VERTIGO 04/29/2009   Osteoarthritis 12/23/2008   Hyperlipidemia associated with type 2 diabetes mellitus ([place]) 12/21/2008   Hypertension associated with diabetes ([place]) 12/21/2008    Past Medical History:  Diagnosis Date   A-fib ([place])    Anal fissure    resolved    Anemia    Back pain    with left radiculopathy   CAD (coronary artery disease)    Cataract    Collagen vascular disease (HCC)    DDD (degenerative disc disease)    Depression    Diabetes mellitus    Diverticulosis    Dyslipidemia    Dysrhythmia    AFib   Esophagitis    GERD (gastroesophageal reflux disease)    Hiatal hernia    Hx of peptic ulcer    Hyperlipidemia    Hypertension    Hypothyroidism    Internal hemorrhoids 2017   NSTEMI (non-ST elevated myocardial infarction) ([place]) 08/05/2015   Osteopenia    Sleep apnea    not using CPAP now, could not tolerate and PCP aware.   Thyroid disease     Family History  Problem Relation Age of Onset   Heart attack Mother    Hypertension Mother    Stroke Mother        Deceased, age 81   Diabetes Sister    Arthritis Sister    Diabetes Sister    Cancer Sister        ovarian   Stroke Sister    Alzheimer's disease Sister    Diabetes Brother        also has a blood disorder    Clotting disorder Brother    Kidney disease Brother    Heart disease Brother    Melanoma Brother        deceased   Diabetes Son    Hypertension Son    Coronary artery disease Son 21       CABG   Stroke Son    Hypertension Son    Coronary artery disease Son 55       CABG   Colon cancer Neg Hx    Past Surgical History:  Procedure Laterality Date   ABDOMINAL HYSTERECTOMY Bilateral 2001 - approximate   APPENDECTOMY     BREAST REDUCTION SURGERY     CARDIAC CATHETERIZATION N/A 08/08/2015   Procedure:  Left Heart Cath and Coronary Angiography;  Surgeon: Burnell Blanks, MD;  Location: [facility];  Service: Cardiovascular;  Laterality: N/A;   CHOLECYSTECTOMY  2008 - approximate   COLONOSCOPY  06/2007   Friable anal canal and anal papillae. Pancolonic diverticula. Normal terminal ileum. Status post segmental biopsy, descending colon biopsy showed minimal cryptitis. Biopsies felt to be  nonspecific but could be seen with NSAIDs. No features of inflammatory bowel disease. Stool studies were negative.   COLONOSCOPY  03/21/2011   RMR: colonic polyps treated as described above, colonic diverticulosis (Tubular adenomas)   COLONOSCOPY WITH PROPOFOL N/A 06/17/2014   RMR: Colonic diverticulosis. Multiple colonic polyps removed as described above.    COLONOSCOPY WITH PROPOFOL N/A 03/19/2016   Procedure: COLONOSCOPY  WITH PROPOFOL;  Surgeon: Daneil Dolin, MD;  [facility];  Service: Endoscopy;  Laterality: N/A;  8:45am   COLONOSCOPY WITH PROPOFOL N/A 10/02/2018   Procedure: COLONOSCOPY WITH PROPOFOL;  Surgeon: Daneil Dolin, MD;  [facility];  Service: Endoscopy;  Laterality: N/A;  9:15am   CORONARY ANGIOPLASTY WITH STENT PLACEMENT     4 stents   ESOPHAGEAL DILATION N/A 06/17/2014   Procedure: ESOPHAGEAL DILATION;  Surgeon: Daneil Dolin, MD;  Location: [facility];  Service: Endoscopy;  Laterality: N/A;  Maloney 41 Fr   ESOPHAGOGASTRODUODENOSCOPY  06/2007   Small hiatal hernia   ESOPHAGOGASTRODUODENOSCOPY  03/21/2011   RMR: normal esophagus- status post passage of Maloney dilator. Small hiatal hernia. Trivial antral and bulbar erosions   ESOPHAGOGASTRODUODENOSCOPY (EGD) WITH PROPOFOL N/A 06/17/2014   RMR: Small hiatal hernia; otherwise normal EGD. Status post passage of a Maloney dilator. No explanation for patients right upper quadrant abdominal pain.    ESOPHAGOGASTRODUODENOSCOPY (EGD) WITH PROPOFOL N/A 03/19/2016   Procedure: ESOPHAGOGASTRODUODENOSCOPY (EGD) WITH  PROPOFOL;  Surgeon: Daneil Dolin, MD;  [facility];  Service: Endoscopy;  Laterality: N/A;   KNEE SURGERY Left    arthroscopy   LIPOMA EXCISION  03/17/2012   Procedure: EXCISION LIPOMA;  Surgeon: Gwenyth Ober, MD;  Location: [facility];  Service: General;  Laterality: Right;  excision of right lower back lipoma   MALONEY DILATION N/A 03/19/2016   Procedure: Venia Minks DILATION;  Surgeon: Daneil Dolin, MD;  [facility];  Service: Endoscopy;  Laterality: N/A;   POLYPECTOMY  06/17/2014   Procedure: POLYPECTOMY;  Surgeon: Daneil Dolin, MD;  Location: [facility];  Service: Endoscopy;;   POLYPECTOMY  10/02/2018   Procedure: POLYPECTOMY;  Surgeon: Daneil Dolin, MD;  [facility];  Service: Endoscopy;;  colon   REDUCTION MAMMAPLASTY Bilateral    Social History   Social History Narrative    Has 4 adult children. She lives home with one of her sons who recently had a stroke and needs 24 hour care. She is the primary caregiver and is now legally separated from her husband.    Immunization History  Administered Date(s) Administered   Fluad Quad(high Dose 65+) 12/19/2018, 03/23/2020   Influenza Split 01/13/2015, 12/31/2016   Influenza, High Dose Seasonal PF 02/07/2015, 01/21/2017, 02/12/2018   Influenza,inj,Quad PF,6+ Mos 01/19/2013, 05/20/2014, 01/10/2016   Moderna Sars-Covid-2 Vaccination 07/02/2019, 08/04/2019   Pneumococcal Conjugate-13 08/13/2013   Pneumococcal Polysaccharide-23 02/07/2015   Tdap 12/18/2012     Objective: Vital Signs: BP 131/76 (BP Location: Right Arm, Patient Position: Sitting, Cuff Size: Normal)   Pulse 66   Ht 5' 1"  (1.549 m)   Wt 176 lb 3.2 oz (79.9 kg)   BMI 33.29 kg/m    Physical Exam Constitutional:      Appearance: She is obese.  HENT:     Right Ear: External ear normal.     Left Ear: External ear normal.  Cardiovascular:     Rate and Rhythm: Normal rate and regular rhythm.  Pulmonary:     Effort:  Pulmonary effort is normal.     Breath sounds: Normal breath sounds.  Skin:    General: Skin is warm and dry.     Findings: No rash.  Neurological:     General: No focal deficit present.     Mental Status: She is alert.     Deep Tendon Reflexes: Reflexes normal.  Psychiatric:  Mood and Affect: Mood normal.     Musculoskeletal Exam:  Neck full ROM no tenderness Shoulders decreased left ROM and pain laterally also superiorly, right side normal ROM Left elbow decreased extension ROM without tenderness or swelling Wrists full ROM no tenderness or swelling Severe 1st CMC joint squaring bilaterally, DIP heberdon's nodes bilaterally No paraspinal tenderness to palpation over upper and lower back Hip normal internal and external rotation without pain, no tenderness to lateral hip palpation Knees B/l patellofemoral crepitus, lateral joint line tenderness, left knee decreased extension ROM Ankles full ROM no tenderness or swelling   Investigation: No additional findings.  Imaging: XR Hand 2 View Left  Result Date: 10/21/2020 X-ray left hand 2 views Radiocarpal joint space appears intact.  Some ulnar rotation of the wrist position.  Moderately advanced first CMC joint arthritis with joint subluxation and MCP hyperextension.  Multiple cystic changes present in the base of the metacarpals.  Possible mild generalized MCP joint narrowing but it appears intact with no erosions or osteophytes.  Moderately advanced PIP joint degenerative arthritis DIP joints severely arthritic with complete loss of joint space extensive osteophytes probable central erosion at the third DIP visualized.  Bone density suggestive of possible generalized osteopenia. Impression Advanced osteoarthritis at the first [facility] and all DIP joints with probable central erosions suspicious for erosive osteoarthritis  XR Hand 2 View Right  Result Date: 10/21/2020 X-ray right hand 2 views Radiocarpal joint space appears normal.   Most carpal joints appear intact there is advanced degenerative arthritis of the first [facility] joint and hyperextension of the first MCP.  Possible mild MCP joint space narrowing with no osteophytes or erosions.  Moderately advanced PIP joint degenerative changes there is severe DIP joint arthritis with complete loss of joint space and some probable fusion at the fourth DIP.  Bone mineralization questionable for generalized osteopenia. Impression Severe degenerative arthritis changes at the first [facility] and DIP joints question DIP joint erosive osteoarthritis no obvious changes indicating a rheumatoid arthritis   Recent Labs: Lab Results  Component Value Date   WBC 5.8 08/30/2020   HGB 12.3 08/30/2020   PLT 155 08/30/2020   NA 141 08/30/2020   K 4.9 08/30/2020   CL 104 08/30/2020   CO2 23 08/30/2020   GLUCOSE 111 (H) 08/30/2020   BUN 13 08/30/2020   CREATININE 1.00 08/30/2020   BILITOT 0.7 08/30/2020   ALKPHOS 84 08/30/2020   AST 18 08/30/2020   ALT 13 08/30/2020   PROT 6.7 08/30/2020   ALBUMIN 4.3 08/30/2020   CALCIUM 10.2 08/30/2020   GFRAA 63 03/23/2020    Speciality Comments: No specialty comments available.  Procedures:  No procedures performed Allergies: Promethazine hcl   Assessment / Plan:     Visit Diagnoses: Rheumatoid factor positive  Rheumatoid arthritis with rheumatoid factor of multiple sites without organ or systems involvement ([place]) - Plan: Sedimentation rate, 14-3-3 eta Protein, Cyclic citrul peptide antibody, IgG, XR Hand 2 View Right, XR Hand 2 View Left  Positive rheumatoid factor and joint pain in multiple sites especially bilateral hands bilateral knees, although left shoulder pain sounds more degenerative and chronic injury related.  Checking sedimentation rate, 14 3 3, CCP for additional evidence of inflammatory disease activity.  Bilateral hand x-rays for any erosive inflammatory arthritis changes.  We will definitely want to follow-up and recheck cannot confirm  any active synovitis at this time.  Generalized osteoarthritis of multiple sites - Plan: XR Hand 2 View Right, XR Hand 2 View Left  Extensive osteoarthritis changes noted on exam with bony enlargement of multiple joints crepitus some decreased range of motion no active synovitis appreciated.  Checking bilateral hand x-rays for evidence of any inflammatory changes.    Orders: Orders Placed This Encounter  Procedures   XR Hand 2 View Right   XR Hand 2 View Left   Sedimentation rate   14-3-3 eta Protein   Cyclic citrul peptide antibody, IgG    No orders of the defined types were placed in this encounter.   Follow-Up Instructions: Return in about 2 weeks (around 11/02/2020) for OA +?RA new pt f/u 2wks.   Collier Salina, MD  Note - This record has been created using [employer].  Chart creation errors have been sought, but may not always  have been located. Such creation errors do not reflect on  the standard of medical care.

## 2020-10-19 NOTE — Patient Instructions (Addendum)
Erythrocyte Sedimentation Rate Test Why am I having this test? The erythrocyte sedimentation rate (ESR) test is used to help find illnesses related to: Sudden (acute) or long-term (chronic) infections. Inflammation. The body's disease-fighting system attacking healthy cells (autoimmune diseases). Cancer. Tissue death. If you have symptoms that may be related to any of these illnesses, your health care provider may do an ESR test before doing more specific tests. If you have an inflammatory immune disease, such as rheumatoid arthritis, you may have thistest to help monitor your therapy. What is being tested? This test measures how long it takes for your red blood cells (erythrocytes) to settle in a solution over a certain amount of time (sedimentation rate). When you have an infection or inflammation, your red blood cells clump together and settle faster. The sedimentation rate provides information abouthow much inflammation is present in the body. What kind of sample is taken?  A blood sample is required for this test. It is usually collected by insertinga needle into a blood vessel. How do I prepare for this test? Follow any instructions from your health care provider about changing orstopping your regular medicines. Tell a health care provider about: Any allergies you have. All medicines you are taking, including vitamins, herbs, eye drops, creams, and over-the-counter medicines. Any blood disorders you have. Any surgeries you have had. Any medical conditions you have, such as thyroid or kidney disease. Whether you are pregnant or may be pregnant. How are the results reported? Your results will be reported as a value that measures sedimentation rate in millimeters per hour (mm/hr). Your health care provider will compare your results to normal ranges that were established after testing a large group of people (reference values). Reference values may vary among labs and hospitals. For this  test, common reference values, which vary by age and gender, are: Newborn: 0-2 mm/hr. Child, up to puberty: 0-10 mm/hr. Female: Under 50 years: 0-20 mm/hr. 50-85 years: 0-30 mm/hr. Over 85 years: 0-42 mm/hr. Female: Under 50 years: 0-15 mm/hr. 50-85 years: 0-20 mm/hr. Over 85 years: 0-30 mm/hr. Certain conditions or medicines may cause ESR levels to be falsely lower or higher, such as: Pregnancy. Obesity. Steroids, birth control pills, and blood thinners. Thyroid or kidney disease. What do the results mean? Results that are within reference values are considered normal, meaning that the level of inflammation in your body is healthy. High ESR levels mean that there is inflammation in your body. You will have more tests to help make adiagnosis. Inflammation may result from many different conditions or injuries. Talk with your health care provider about what your results mean. Questions to ask your health care provider Ask your health care provider, or the department that is doing the test: When will my results be ready? How will I get my results? What are my treatment options? What other tests do I need? What are my next steps? Summary The erythrocyte sedimentation rate (ESR) test is used to help find illnesses associated with sudden (acute) or long-term (chronic) infections, inflammation, autoimmune diseases, cancer, or tissue death. If you have symptoms that may be related to any of these illnesses, your health care provider may do an ESR test before doing more specific tests. If you have an inflammatory immune disease, such as rheumatoid arthritis, you may have this test to help monitor your therapy. This test measures how long it takes for your red blood cells (erythrocytes) to settle in a solution over a certain amount of time (sedimentation rate). This  provides information about how much inflammation is present in the body. Anticyclic-Citrullinated Peptide Antibody Test Why am I  having this test? You may have the anticyclic-citrullinated peptide antibody test done to help: Diagnose rheumatoid arthritis (RA). RA is a long-term (chronic) disease that causes inflammation in the joints. Determine the severity of your RA, including how much worse it is getting (progression). This test may be done if you have unexplained joint inflammation and have previously tested negative for rheumatoid factor. It may also be done if you have been diagnosed with undifferentiated arthritis and your health careprovider suspects rheumatoid arthritis. What is being tested? This test checks your blood for the presence of anticyclic-citrullinated peptide antibodies. Antibodies are cells that are part of the body's disease-fighting (immune) system. These antibodies appear early in the course of RA and are thought tobe directly involved in the progression of the disease. What kind of sample is taken?  A blood sample is required for this test. It is usually collected by insertinga needle into a blood vessel. How are the results reported? Your test results will be reported as either positive or negative. A result is considered negative if there is less than 20 units of the antibody per mL ofblood. What do the results mean? A positive blood test may mean that you have RA. A negative blood test means that it is less likely that you have RA. However, a negative test does not completely rule out rheumatoid arthritis. Talk with your health care provider about what your results mean. Questions to ask your health care provider Ask your health care provider, or the department that is doing the test: When will my results be ready? How will I get my results? What are my treatment options? What other tests do I need? What are my next steps? Summary The anticyclic-citrullinated peptide antibody blood test may be done to help your health care provider diagnose rheumatoid arthritis (RA). This test checks your  blood for the presence of anticyclic-citrullinated peptide antibodies. These antibodies appear early in the course of RA. A positive blood test may mean that you have RA. This information is not intended to replace advice given to you by your health care provider. Make sure you discuss any questions you have with your healthcare provider. Document Revised: 07/15/2019 Document Reviewed: 07/15/2019 Elsevier Patient Education  [place].

## 2020-10-26 LAB — SEDIMENTATION RATE: Sed Rate: 11 mm/h (ref 0–30)

## 2020-10-26 LAB — 14-3-3 ETA PROTEIN: 14-3-3 eta Protein: <0.2 ng/mL (ref <0.2)

## 2020-10-26 LAB — CYCLIC CITRUL PEPTIDE ANTIBODY, IGG: Cyclic Citrullin Peptide Ab: <16 UNITS

## 2020-11-02 ENCOUNTER — Ambulatory Visit: Payer: Medicare Other | Admitting: Internal Medicine

## 2020-11-02 ENCOUNTER — Other Ambulatory Visit: Payer: Self-pay

## 2020-11-02 ENCOUNTER — Encounter: Payer: Self-pay | Admitting: Internal Medicine

## 2020-11-02 VITALS — BP 143/64 | HR 59 | Ht 61.0 in | Wt 173.2 lb

## 2020-11-02 DIAGNOSIS — M154 Erosive (osteo)arthritis: Secondary | ICD-10-CM | POA: Diagnosis not present

## 2020-11-02 DIAGNOSIS — M159 Polyosteoarthritis, unspecified: Secondary | ICD-10-CM | POA: Diagnosis not present

## 2020-11-02 DIAGNOSIS — R768 Other specified abnormal immunological findings in serum: Secondary | ICD-10-CM | POA: Diagnosis not present

## 2020-11-02 DIAGNOSIS — M5430 Sciatica, unspecified side: Secondary | ICD-10-CM

## 2020-11-02 DIAGNOSIS — M549 Dorsalgia, unspecified: Secondary | ICD-10-CM | POA: Diagnosis not present

## 2020-11-02 MED ORDER — HYDROXYCHLOROQUINE SULFATE 200 MG PO TABS: 200.0000 mg | ORAL_TABLET | Freq: Every day | ORAL | 1 refills | Status: DC

## 2020-11-02 MED ORDER — TIZANIDINE HCL 2 MG PO TABS: 2.0000 mg | ORAL_TABLET | Freq: Every evening | ORAL | 1 refills | Status: DC | PRN

## 2020-11-02 NOTE — Progress Notes (Signed)
Office Visit Note  Patient: Rhonda Garcia             Date of Birth: 23-Jan-1941           MRN: [id number]             PCP: Sharion Balloon, FNP Referring: Sharion Balloon, FNP Visit Date: 11/02/2020   Subjective:  Follow-up (Patient complains of continued joint pain. Patient is here to discuss lab and xray results. )   History of Present Illness: Rhonda Garcia is a 80 y.o. 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  Review of x-rays in the last visit was consistent with erosive OA in the bilateral hands.  She complains of ongoing pain particularly in the low back and bilateral hips this bothers her throughout the day but especially at night lying on either side worsens the hip pain.  Currently she is on no particular effective medicine for this due to avoiding NSAIDs on chronic anticoagulation and mild decreased renal function.  Previous HPI: 10/19/20 Rhonda Garcia is a 80 y.o. female here for positive rheumatoid factor and multiple joint pains.  She reports a history of chronic joint pains present for the past few years but more recently has noticed increasing nodules and joint position deviations visible in her bilateral hands.  She also has pain frequently affecting her bilateral knees these do swell intermittently.  She has noticed occasional catching of the knees denies any falling or giving way.  She feels persistently fatigued and that joint symptoms do interfere with her activity level.  She is used topical Voltaren with small benefit.  She does not take oral NSAIDs due to other medication including chronic anticoagulation on Eliquis and hypertension with lisinopril.   Labs reviewed 08/2020 RF 51.5 Uric acid 6.4 ESR 27 CMP eGFR 57   Review of Systems  Constitutional:  Positive for fatigue.  HENT:  Negative for mouth sores, mouth dryness and nose dryness.   Eyes:  Negative for pain, itching, visual disturbance  and dryness.  Respiratory:  Negative for cough, hemoptysis, shortness of breath and difficulty breathing.   Cardiovascular:  Negative for chest pain, palpitations and swelling in legs/feet.  Gastrointestinal:  Negative for abdominal pain, blood in stool, constipation and diarrhea.  Endocrine: Negative for increased urination.  Genitourinary:  Negative for painful urination.  Musculoskeletal:  Positive for joint pain, joint pain, joint swelling, myalgias, muscle weakness, morning stiffness, muscle tenderness and myalgias.  Skin:  Negative for color change, rash and redness.  Allergic/Immunologic: Negative for susceptible to infections.  Neurological:  Positive for dizziness, headaches and memory loss. Negative for numbness and weakness.  Hematological:  Negative for swollen glands.  Psychiatric/Behavioral:  Positive for sleep disturbance. Negative for confusion.    PMFS History:  Patient Active Problem List   Diagnosis Date Noted   Rheumatoid factor positive 10/19/2020   Generalized osteoarthritis of multiple sites 10/19/2020   Pain in left shoulder 10/19/2020   Erosive osteoarthritis of both hands 10/19/2020   Back pain with sciatica 05/04/2020   Bilateral primary osteoarthritis of knee 01/28/2020   Unintentional weight loss 05/05/2019   Abdominal pain 01/01/2019   Chest pain, rule out acute myocardial infarction 12/19/2018   Diabetes mellitus ([place]) 12/19/2018   History of atrial fibrillation 12/19/2018   Hx of non-ST elevation myocardial infarction (NSTEMI) 12/19/2018   Anemia 07/22/2018   Shortness of breath 01/09/2017   Obese 11/05/2016  Vertigo 06/27/2016   GAD (generalized anxiety disorder) 10/27/2015   Coronary artery disease involving native coronary artery of native heart with unstable angina pectoris ([place])    Hx of adenomatous colonic polyps 06/22/2015   Hypothyroidism 01/17/2015   Vitamin D deficiency 09/09/2014   Hiatal hernia    Dysphagia, pharyngoesophageal phase     History of colonic polyps    Diverticulosis of colon without hemorrhage    Paroxysmal atrial fibrillation ([place]) 11/02/2013   Depression 10/28/2012   Type 2 diabetes mellitus with hyperglycemia ([place]) 10/28/2012   Lipoma of back 02/12/2012   Constipation 02/28/2011   Esophageal dysphagia 02/28/2011   Gastroesophageal reflux 02/28/2011   POSITIONAL VERTIGO 04/29/2009   Osteoarthritis 12/23/2008   Hyperlipidemia associated with type 2 diabetes mellitus ([place]) 12/21/2008   Hypertension associated with diabetes ([place]) 12/21/2008    Past Medical History:  Diagnosis Date   A-fib ([place])    Anal fissure    resolved    Anemia    Back pain    with left radiculopathy   CAD (coronary artery disease)    Cataract    Collagen vascular disease ([place])    DDD (degenerative disc disease)    Depression    Diabetes mellitus    Diverticulosis    Dyslipidemia    Dysrhythmia    AFib   Esophagitis    GERD (gastroesophageal reflux disease)    Hiatal hernia    Hx of peptic ulcer    Hyperlipidemia    Hypertension    Hypothyroidism    Internal hemorrhoids 2017   NSTEMI (non-ST elevated myocardial infarction) ([place]) 08/05/2015   Osteopenia    Sleep apnea    not using CPAP now, could not tolerate and PCP aware.   Thyroid disease     Family History  Problem Relation Age of Onset   Heart attack Mother    Hypertension Mother    Stroke Mother        Deceased, age 74   Diabetes Sister    Arthritis Sister    Diabetes Sister    Cancer Sister        ovarian   Stroke Sister    Alzheimer's disease Sister    Diabetes Brother        also has a blood disorder    Clotting disorder Brother    Kidney disease Brother    Heart disease Brother    Melanoma Brother        deceased   Diabetes Son    Hypertension Son    Coronary artery disease Son 2       CABG   Stroke Son    Hypertension Son    Coronary artery disease Son 58       CABG   Colon cancer Neg Hx    Past Surgical History:  Procedure  Laterality Date   ABDOMINAL HYSTERECTOMY Bilateral 2001 - approximate   APPENDECTOMY     BREAST REDUCTION SURGERY     CARDIAC CATHETERIZATION N/A 08/08/2015   Procedure: Left Heart Cath and Coronary Angiography;  Surgeon: Burnell Blanks, MD;  Location: [facility];  Service: Cardiovascular;  Laterality: N/A;   CHOLECYSTECTOMY  2008 - approximate   COLONOSCOPY  06/2007   Friable anal canal and anal papillae. Pancolonic diverticula. Normal terminal ileum. Status post segmental biopsy, descending colon biopsy showed minimal cryptitis. Biopsies felt to be  nonspecific but could be seen with NSAIDs. No features of inflammatory bowel disease. Stool studies were negative.  COLONOSCOPY  03/21/2011   RMR: colonic polyps treated as described above, colonic diverticulosis (Tubular adenomas)   COLONOSCOPY WITH PROPOFOL N/A 06/17/2014   RMR: Colonic diverticulosis. Multiple colonic polyps removed as described above.    COLONOSCOPY WITH PROPOFOL N/A 03/19/2016   Procedure: COLONOSCOPY WITH PROPOFOL;  Surgeon: Daneil Dolin, MD;  [facility];  Service: Endoscopy;  Laterality: N/A;  8:45am   COLONOSCOPY WITH PROPOFOL N/A 10/02/2018   Procedure: COLONOSCOPY WITH PROPOFOL;  Surgeon: Daneil Dolin, MD;  [facility];  Service: Endoscopy;  Laterality: N/A;  9:15am   CORONARY ANGIOPLASTY WITH STENT PLACEMENT     4 stents   ESOPHAGEAL DILATION N/A 06/17/2014   Procedure: ESOPHAGEAL DILATION;  Surgeon: Daneil Dolin, MD;  Location: [facility];  Service: Endoscopy;  Laterality: N/A;  Maloney 49 Fr   ESOPHAGOGASTRODUODENOSCOPY  06/2007   Small hiatal hernia   ESOPHAGOGASTRODUODENOSCOPY  03/21/2011   RMR: normal esophagus- status post passage of Maloney dilator. Small hiatal hernia. Trivial antral and bulbar erosions   ESOPHAGOGASTRODUODENOSCOPY (EGD) WITH PROPOFOL N/A 06/17/2014   RMR: Small hiatal hernia; otherwise normal EGD. Status post passage of a Maloney dilator. No explanation  for patients right upper quadrant abdominal pain.    ESOPHAGOGASTRODUODENOSCOPY (EGD) WITH PROPOFOL N/A 03/19/2016   Procedure: ESOPHAGOGASTRODUODENOSCOPY (EGD) WITH PROPOFOL;  Surgeon: Daneil Dolin, MD;  [facility];  Service: Endoscopy;  Laterality: N/A;   KNEE SURGERY Left    arthroscopy   LIPOMA EXCISION  03/17/2012   Procedure: EXCISION LIPOMA;  Surgeon: Gwenyth Ober, MD;  Location: [facility];  Service: General;  Laterality: Right;  excision of right lower back lipoma   MALONEY DILATION N/A 03/19/2016   Procedure: Venia Minks DILATION;  Surgeon: Daneil Dolin, MD;  [facility];  Service: Endoscopy;  Laterality: N/A;   POLYPECTOMY  06/17/2014   Procedure: POLYPECTOMY;  Surgeon: Daneil Dolin, MD;  [facility];  Service: Endoscopy;;   POLYPECTOMY  10/02/2018   Procedure: POLYPECTOMY;  Surgeon: Daneil Dolin, MD;  [facility];  Service: Endoscopy;;  colon   REDUCTION MAMMAPLASTY Bilateral    Social History   Social History Narrative    Has 4 adult children. She lives home with one of her sons who recently had a stroke and needs 24 hour care. She is the primary caregiver and is now legally separated from her husband.    Immunization History  Administered Date(s) Administered   Fluad Quad(high Dose 65+) 12/19/2018, 03/23/2020   Influenza Split 01/13/2015, 12/31/2016   Influenza, High Dose Seasonal PF 02/07/2015, 01/21/2017, 02/12/2018   Influenza,inj,Quad PF,6+ Mos 01/19/2013, 05/20/2014, 01/10/2016   Moderna Sars-Covid-2 Vaccination 07/02/2019, 08/04/2019   Pneumococcal Conjugate-13 08/13/2013   Pneumococcal Polysaccharide-23 02/07/2015   Tdap 12/18/2012     Objective: Vital Signs: BP (!) 143/64 (BP Location: Left Arm, Patient Position: Sitting, Cuff Size: Normal)   Pulse (!) 59   Ht 5' 1" (1.549 m)   Wt 173 lb 3.2 oz (78.6 kg)   BMI 32.73 kg/m    Physical Exam Constitutional:      Appearance: She is obese.  Skin:     General: Skin is warm and dry.     Findings: No rash.  Neurological:     Mental Status: She is alert.     Musculoskeletal Exam:  Tenderness to palpitation at the right elbow worst just distal to the lateral epicondyle with no tenderness or Tinel's to percussion of  the cubital tunnel, left elbow normal Hands with extensive Heberden nodes DIP joints most fixed in a flexed position no active swelling or erythema and mild or no tenderness to palpation Bilateral paraspinal muscle tenderness to palpation over the lumbar spine over the sacrum and over the lateral and posterior hips, hip internal and external range of motion is normal lateral hip pain provoked with pace and Faber maneuver   Investigation: No additional findings.  Imaging: XR Hand 2 View Left  Result Date: 10/21/2020 X-ray left hand 2 views Radiocarpal joint space appears intact.  Some ulnar rotation of the wrist position.  Moderately advanced first CMC joint arthritis with joint subluxation and MCP hyperextension.  Multiple cystic changes present in the base of the metacarpals.  Possible mild generalized MCP joint narrowing but it appears intact with no erosions or osteophytes.  Moderately advanced PIP joint degenerative arthritis DIP joints severely arthritic with complete loss of joint space extensive osteophytes probable central erosion at the third DIP visualized.  Bone density suggestive of possible generalized osteopenia. Impression Advanced osteoarthritis at the first [facility] and all DIP joints with probable central erosions suspicious for erosive osteoarthritis  XR Hand 2 View Right  Result Date: 10/21/2020 X-ray right hand 2 views Radiocarpal joint space appears normal.  Most carpal joints appear intact there is advanced degenerative arthritis of the first  [facility] joint and hyperextension of the first MCP.  Possible mild MCP joint space narrowing with no osteophytes or erosions.  Moderately advanced PIP joint degenerative changes  there is severe DIP joint arthritis with complete loss of joint space and some probable fusion at the fourth DIP.  Bone mineralization questionable for generalized osteopenia. Impression Severe degenerative arthritis changes at the first [facility] and DIP joints question DIP joint erosive osteoarthritis no obvious changes indicating a rheumatoid arthritis   Recent Labs: Lab Results  Component Value Date   WBC 5.8 08/30/2020   HGB 12.3 08/30/2020   PLT 155 08/30/2020   NA 141 08/30/2020   K 4.9 08/30/2020   CL 104 08/30/2020   CO2 23 08/30/2020   GLUCOSE 111 (H) 08/30/2020   BUN 13 08/30/2020   CREATININE 1.00 08/30/2020   BILITOT 0.7 08/30/2020   ALKPHOS 84 08/30/2020   AST 18 08/30/2020   ALT 13 08/30/2020   PROT 6.7 08/30/2020   ALBUMIN 4.3 08/30/2020   CALCIUM 10.2 08/30/2020   GFRAA 63 03/23/2020    Speciality Comments: No specialty comments available.  Procedures:  No procedures performed Allergies: Promethazine hcl   Assessment / Plan:     Visit Diagnoses: Erosive osteoarthritis of both hands - Plan: hydroxychloroquine (PLAQUENIL) 200 MG tablet, Ambulatory referral to Ophthalmology  Discussed joint deformities most consistent with erosive OA that this disease is generally not treatment responsive but usually lasts progressive deformity and debility compared to a rheumatoid arthritis.  Recommend starting hydroxychloroquine 200 mg daily for this arthritis.  Discussed risks including QT prolongation she has no history and review of previous EKG within normal limits.  Discussed need for annual retinal toxicity monitoring referral to ophthalmology placed.  Rheumatoid factor positive  Rheumatoid factor positive I do not see any clinical evidence of rheumatoid arthritis on exam or at the last visit.  Deforming arthritis I see currently is generalized OA and erosive OA.  No other specific findings or history currently suggestive of a clinically significant underlying cause of positive  rheumatoid factor.  Back pain with sciatica - Plan: tiZANidine (ZANAFLEX) 2 MG tablet  Back and hip pain  seems more consistent with paraspinal and hip muscle and tendon pain than true joint arthritis currently.  Recommend trial of low-dose oral muscle relaxant at night for the lateral hip pain with sleeping.  Discussed recommendation to take this only once daily and at night due to possible CNS depression.  She is not interested in referral to physical therapy at this time states she can do exercise or stretching at home.  Orders: Orders Placed This Encounter  Procedures   Ambulatory referral to Ophthalmology    Meds ordered this encounter  Medications   hydroxychloroquine (PLAQUENIL) 200 MG tablet    Sig: Take 1 tablet (200 mg total) by mouth daily.    Dispense:  30 tablet    Refill:  1   tiZANidine (ZANAFLEX) 2 MG tablet    Sig: Take 1 tablet (2 mg total) by mouth at bedtime as needed for muscle spasms.    Dispense:  30 tablet    Refill:  1      Follow-Up Instructions: Return in about 8 weeks (around 12/28/2020) for Erosive OA HCQ/Tizanidine f/u 8wks.   Collier Salina, MD  Note - This record has been created using [employer].  Chart creation errors have been sought, but may not always  have been located. Such creation errors do not reflect on  the standard of medical care.

## 2020-11-03 ENCOUNTER — Telehealth: Payer: Self-pay

## 2020-11-03 NOTE — Telephone Encounter (Signed)
[facility] left a voicemail stating they received referral for the patient and scheduled the appointment.  Unfortunately not all of the referral came through and are requesting it be re-faxed.  Fax [phone]

## 2020-11-03 NOTE — Telephone Encounter (Signed)
Re-faxed.

## 2020-11-15 ENCOUNTER — Other Ambulatory Visit: Payer: Self-pay | Admitting: Cardiology

## 2020-11-15 ENCOUNTER — Other Ambulatory Visit: Payer: Self-pay | Admitting: Family

## 2020-11-15 DIAGNOSIS — E1169 Type 2 diabetes mellitus with other specified complication: Secondary | ICD-10-CM

## 2020-11-15 DIAGNOSIS — E785 Hyperlipidemia, unspecified: Secondary | ICD-10-CM

## 2020-11-15 DIAGNOSIS — K219 Gastro-esophageal reflux disease without esophagitis: Secondary | ICD-10-CM

## 2020-11-22 ENCOUNTER — Other Ambulatory Visit: Payer: Self-pay

## 2020-11-22 ENCOUNTER — Encounter: Payer: Self-pay | Admitting: Family

## 2020-11-22 ENCOUNTER — Ambulatory Visit (INDEPENDENT_AMBULATORY_CARE_PROVIDER_SITE_OTHER): Payer: Medicare Other | Admitting: Family

## 2020-11-22 VITALS — BP 154/66 | HR 69 | Temp 97.2°F | Ht 61.0 in | Wt 174.0 lb

## 2020-11-22 DIAGNOSIS — I480 Paroxysmal atrial fibrillation: Secondary | ICD-10-CM

## 2020-11-22 DIAGNOSIS — I25110 Atherosclerotic heart disease of native coronary artery with unstable angina pectoris: Secondary | ICD-10-CM | POA: Diagnosis not present

## 2020-11-22 DIAGNOSIS — E1159 Type 2 diabetes mellitus with other circulatory complications: Secondary | ICD-10-CM

## 2020-11-22 DIAGNOSIS — R768 Other specified abnormal immunological findings in serum: Secondary | ICD-10-CM | POA: Diagnosis not present

## 2020-11-22 DIAGNOSIS — K219 Gastro-esophageal reflux disease without esophagitis: Secondary | ICD-10-CM

## 2020-11-22 DIAGNOSIS — E039 Hypothyroidism, unspecified: Secondary | ICD-10-CM

## 2020-11-22 DIAGNOSIS — I152 Hypertension secondary to endocrine disorders: Secondary | ICD-10-CM | POA: Diagnosis not present

## 2020-11-22 DIAGNOSIS — E1169 Type 2 diabetes mellitus with other specified complication: Secondary | ICD-10-CM

## 2020-11-22 DIAGNOSIS — M159 Polyosteoarthritis, unspecified: Secondary | ICD-10-CM | POA: Diagnosis not present

## 2020-11-22 LAB — BAYER DCA HB A1C WAIVED: HB A1C (BAYER DCA - WAIVED): 6.9 % (ref <7.0)

## 2020-11-22 MED ORDER — METHYLPREDNISOLONE ACETATE 80 MG/ML IJ SUSP
80.0000 mg | Freq: Once | INTRAMUSCULAR | Status: AC
Start: 2020-11-22 — End: 2020-11-22
  Administered 2020-11-22: 80 mg via INTRAMUSCULAR

## 2020-11-22 NOTE — Patient Instructions (Signed)
Rheumatoid Arthritis Rheumatoid arthritis (RA) is a long-term (chronic) disease that causes inflammation in your joints. RA may start slowly. It most often affects the small joints of the hands and feet. Usually, the same joints are affected on both sides of your body. Inflammation from RA can also affectother parts of your body, including your heart, eyes, or lungs. There is no cure for RA, but medicines can help your symptoms and halt or slowdown the progression of the disease. What are the causes? RA is an autoimmune disease. When you have an autoimmune disease, your body's defense system (immune system) mistakenly attacks healthy body tissues. The exact cause of RA is not known. What increases the risk? You are more likely to develop this condition if you: Are a woman. Have a family history of RA or other autoimmune diseases. Have a history of smoking. Are obese. Have been exposed to pollutants or chemicals. What are the signs or symptoms? The first symptom of this condition may be morning stiffness that lasts longer than 30 minutes. Symptoms usually start gradually. They are often worse in the morning. As RA progresses, symptoms may include: Pain, stiffness, swelling, warmth, and tenderness in joints on both sides of your body. Loss of energy. Loss of appetite. Weight loss. Low-grade fever. Dry eyes and dry mouth. Firm lumps (rheumatoid nodules) that grow beneath your skin in areas such as your forearm bones near your elbows and on your hands. Changes in the appearance of joints (deformity) and loss of joint function. Symptoms of this condition vary from person to person. Symptoms of RA often come and go. Sometimes, symptoms get worse for a period of time. These are called flares. How is this diagnosed? This condition is diagnosed based on your symptoms, medical history, and physical exam. You may have X-rays or an MRI to check for the type of joint changes that are caused by  RA. You may also have blood tests to look for: Proteins (antibodies) that your immune system may make if you have RA. These include rheumatoid factor (RF) and anti-CCP. When blood tests show these proteins, you are said to have "seropositive RA." When blood tests do not show these proteins, you may have "seronegative RA." Inflammation in your blood. A low number of red blood cells (anemia). How is this treated? The goals of treatment are to relieve pain, reduce inflammation, and slow down or stop joint damage and disability. Treatment may include: Lifestyle changes. It is important to rest as needed, eat a healthy diet, and exercise. Medicines. Your health care provider may adjust your medicines every 3 months until treatment goals are reached. Common medicines include: Pain relievers (analgesics). Corticosteroids and NSAIDs to reduce inflammation. Disease-modifying antirheumatic drugs (DMARDs) to try to slow the course of the disease. Biologic response modifiers to reduce inflammation and damage. Physical therapy and occupational therapy. Surgery, if you have severe joint damage. Joint replacement or fusing of joints may be needed. Your health care provider will work with you to identify the best treatmentoption for you based on assessment of the overall disease activity in your body. Follow these instructions at home: Activity Return to your normal activities as told by your health care provider. Ask your health care provider what activities are safe for you. Rest when you are having a flare. Start an exercise program as told by your health care provider. General instructions Keep all follow-up visits as told by your health care provider. This is important. Take over-the-counter and prescription medicines only as  told by your health care provider. Where to find more information [employer] of Rheumat[facility]: www.rheumatology.[place]: www.arthritis.org Contact a  health care provider if: You have a flare-up of RA symptoms. You have a fever. You have side effects from your medicines. Get help right away if: You have chest pain. You have trouble breathing. You quickly develop a hot, painful joint that is more severe than your usual joint aches. Summary Rheumatoid arthritis (RA) is a long-term (chronic) disease that causes inflammation in your joints. RA is an autoimmune disease. The goals of treatment are to relieve pain, reduce inflammation, and slow down or stop joint damage and disability. This information is not intended to replace advice given to you by your health care provider. Make sure you discuss any questions you have with your healthcare provider. Document Revised: 10/07/2018 Document Reviewed: 11/26/2017 Elsevier Patient Education  2022 [employer].

## 2020-11-22 NOTE — Progress Notes (Signed)
Subjective:    Patient ID: Rhonda Garcia, female    DOB: 05/21/1940, 80 y.o.   MRN: [id number]  Chief Complaint  Patient presents with   Arthritis   PT presents to the office today for chronic follow up. She is followed by Cardiologists every 6 months for A Fib and CAD Stress test on 08/10/20. She is followed by GI as needed for constipation, GERD.  She had elevated rheumatoid arthritis and saw a rheumatologists. She states her pain is a 10 out 10.  Arthritis Presents for follow-up visit. She complains of pain and stiffness. The symptoms have been stable. Affected locations include the left knee, right knee, left MCP, right MCP, right hip and left hip. Her pain is at a severity of 10/10. Associated symptoms include fatigue.  Hypertension This is a chronic problem. The current episode started more than 1 year ago. The problem has been waxing and waning since onset. The problem is uncontrolled. Associated symptoms include blurred vision. Pertinent negatives include no malaise/fatigue, peripheral edema or shortness of breath. Risk factors for coronary artery disease include diabetes mellitus, dyslipidemia, obesity and sedentary lifestyle. The current treatment provides moderate improvement. Identifiable causes of hypertension include a thyroid problem.  Diabetes She presents for her follow-up diabetic visit. She has type 2 diabetes mellitus. Associated symptoms include blurred vision and fatigue. Pertinent negatives for diabetes include no foot paresthesias. Diabetic complications include heart disease. Risk factors for coronary artery disease include diabetes mellitus, dyslipidemia, hypertension, sedentary lifestyle and post-menopausal. She is following a generally healthy diet. She has had a previous visit with a dietitian. Her overall blood glucose range is 110-130 mg/dl. An ACE inhibitor/angiotensin II receptor blocker is being taken.  Thyroid Problem Presents for follow-up visit. Symptoms  include fatigue. Patient reports no diaphoresis or hoarse voice. The symptoms have been stable.     Review of Systems  Constitutional:  Positive for fatigue. Negative for diaphoresis and malaise/fatigue.  HENT:  Negative for hoarse voice.   Eyes:  Positive for blurred vision.  Respiratory:  Negative for shortness of breath.   Musculoskeletal:  Positive for arthritis and stiffness.  All other systems reviewed and are negative.     Objective:   Physical Exam Vitals reviewed.  Constitutional:      General: She is not in acute distress.    Appearance: She is well-developed. She is obese.  HENT:     Head: Normocephalic and atraumatic.     Right Ear: Tympanic membrane normal.     Left Ear: Tympanic membrane normal.  Eyes:     Pupils: Pupils are equal, round, and reactive to light.  Neck:     Thyroid: No thyromegaly.  Cardiovascular:     Rate and Rhythm: Normal rate and regular rhythm.     Heart sounds: Normal heart sounds. No murmur heard. Pulmonary:     Effort: Pulmonary effort is normal. No respiratory distress.     Breath sounds: Normal breath sounds. No wheezing.  Abdominal:     General: Bowel sounds are normal. There is no distension.     Palpations: Abdomen is soft.     Tenderness: There is no abdominal tenderness.  Musculoskeletal:        General: Tenderness present. Normal range of motion.     Cervical back: Normal range of motion and neck supple.     Comments: Pain in right hip with flexion and extension, pain in lumbar with flexion and extension  Skin:    General: Skin is  warm and dry.  Neurological:     Mental Status: She is alert and oriented to person, place, and time.     Cranial Nerves: No cranial nerve deficit.     Deep Tendon Reflexes: Reflexes are normal and symmetric.  Psychiatric:        Behavior: Behavior normal.        Thought Content: Thought content normal.        Judgment: Judgment normal.      BP (!) 154/66   Pulse 69   Temp (!) 97.2 F  (36.2 C) (Temporal)   Ht 5' 1"  (1.549 m)   Wt 174 lb (78.9 kg)   BMI 32.88 kg/m      Assessment & Plan:  JAZYIAH YIU comes in today with chief complaint of Arthritis   Diagnosis and orders addressed:  1. Hypertension associated with diabetes ([place])  - Bayer DCA Hb A1c Waived - CMP14+EGFR  2. Coronary artery disease involving native coronary artery of native heart with unstable angina pectoris (HCC) - Bayer DCA Hb A1c Waived - CMP14+EGFR  3. Paroxysmal atrial fibrillation (HCC) - Bayer DCA Hb A1c Waived - CMP14+EGFR  4. Gastroesophageal reflux disease without esophagitis - Bayer DCA Hb A1c Waived - CMP14+EGFR  5. Type 2 diabetes mellitus with other specified complication, without long-term current use of insulin (HCC) - Bayer DCA Hb A1c Waived - CMP14+EGFR  6. Hypothyroidism, unspecified type - Bayer DCA Hb A1c Waived - CMP14+EGFR  7. Rheumatoid factor positive Keep follow up with Rheumatologists  - Bayer DCA Hb A1c Waived - CMP14+EGFR - methylPREDNISolone acetate (DEPO-MEDROL) injection 80 mg  8. Generalized osteoarthritis of multiple sites - Bayer DCA Hb A1c Waived - CMP14+EGFR - methylPREDNISolone acetate (DEPO-MEDROL) injection 80 mg   Labs pending Health Maintenance reviewed Diet and exercise encouraged  Follow up plan: 3 months    Evelina Dun, FNP

## 2020-11-23 ENCOUNTER — Other Ambulatory Visit: Payer: Self-pay | Admitting: Family

## 2020-11-23 DIAGNOSIS — R112 Nausea with vomiting, unspecified: Secondary | ICD-10-CM

## 2020-11-23 LAB — CMP14+EGFR
ALT: 9 IU/L (ref 0–32)
AST: 17 IU/L (ref 0–40)
Albumin/Globulin Ratio: 2 (ref 1.2–2.2)
Albumin: 4.3 g/dL (ref 3.7–4.7)
Alkaline Phosphatase: 85 IU/L (ref 44–121)
BUN/Creatinine Ratio: 11 — ABNORMAL LOW (ref 12–28)
BUN: 10 mg/dL (ref 8–27)
Bilirubin Total: 0.7 mg/dL (ref 0.0–1.2)
CO2: 25 mmol/L (ref 20–29)
Calcium: 9.2 mg/dL (ref 8.7–10.3)
Chloride: 105 mmol/L (ref 96–106)
Creatinine, Ser: 0.95 mg/dL (ref 0.57–1.00)
Globulin, Total: 2.2 g/dL (ref 1.5–4.5)
Glucose: 137 mg/dL — ABNORMAL HIGH (ref 65–99)
Potassium: 4.6 mmol/L (ref 3.5–5.2)
Sodium: 142 mmol/L (ref 134–144)
Total Protein: 6.5 g/dL (ref 6.0–8.5)
eGFR: 61 mL/min/{1.73_m2} (ref >59)

## 2020-11-23 NOTE — Telephone Encounter (Signed)
Last office visit 11/22/20 Last refill 08/30/20, #20, no refills

## 2020-11-30 ENCOUNTER — Other Ambulatory Visit: Payer: Self-pay | Admitting: Family

## 2020-11-30 DIAGNOSIS — I152 Hypertension secondary to endocrine disorders: Secondary | ICD-10-CM

## 2020-11-30 DIAGNOSIS — E1159 Type 2 diabetes mellitus with other circulatory complications: Secondary | ICD-10-CM

## 2020-12-01 ENCOUNTER — Ambulatory Visit: Payer: Medicare Other | Admitting: Family

## 2020-12-10 ENCOUNTER — Emergency Department (HOSPITAL_COMMUNITY): Payer: Medicare Other

## 2020-12-10 ENCOUNTER — Emergency Department (HOSPITAL_COMMUNITY)
Admission: EM | Admit: 2020-12-10 | Discharge: 2020-12-10 | Disposition: A | Discharge status: Home / Self Care | Payer: Medicare Other | Attending: Emergency Medicine | Admitting: Emergency Medicine

## 2020-12-10 ENCOUNTER — Encounter (HOSPITAL_COMMUNITY): Payer: Self-pay

## 2020-12-10 ENCOUNTER — Other Ambulatory Visit: Payer: Self-pay

## 2020-12-10 DIAGNOSIS — I4891 Unspecified atrial fibrillation: Secondary | ICD-10-CM | POA: Insufficient documentation

## 2020-12-10 DIAGNOSIS — Z79899 Other long term (current) drug therapy: Secondary | ICD-10-CM | POA: Insufficient documentation

## 2020-12-10 DIAGNOSIS — E1169 Type 2 diabetes mellitus with other specified complication: Secondary | ICD-10-CM | POA: Insufficient documentation

## 2020-12-10 DIAGNOSIS — M25561 Pain in right knee: Secondary | ICD-10-CM | POA: Diagnosis not present

## 2020-12-10 DIAGNOSIS — M1711 Unilateral primary osteoarthritis, right knee: Secondary | ICD-10-CM | POA: Diagnosis not present

## 2020-12-10 DIAGNOSIS — R52 Pain, unspecified: Secondary | ICD-10-CM | POA: Diagnosis not present

## 2020-12-10 DIAGNOSIS — I10 Essential (primary) hypertension: Secondary | ICD-10-CM | POA: Insufficient documentation

## 2020-12-10 DIAGNOSIS — E039 Hypothyroidism, unspecified: Secondary | ICD-10-CM | POA: Diagnosis not present

## 2020-12-10 DIAGNOSIS — I2510 Atherosclerotic heart disease of native coronary artery without angina pectoris: Secondary | ICD-10-CM | POA: Insufficient documentation

## 2020-12-10 DIAGNOSIS — Z7901 Long term (current) use of anticoagulants: Secondary | ICD-10-CM | POA: Insufficient documentation

## 2020-12-10 DIAGNOSIS — Z7984 Long term (current) use of oral hypoglycemic drugs: Secondary | ICD-10-CM | POA: Diagnosis not present

## 2020-12-10 DIAGNOSIS — R0902 Hypoxemia: Secondary | ICD-10-CM | POA: Diagnosis not present

## 2020-12-10 DIAGNOSIS — E785 Hyperlipidemia, unspecified: Secondary | ICD-10-CM | POA: Insufficient documentation

## 2020-12-10 MED ORDER — HYDROCODONE-ACETAMINOPHEN 5-325 MG PO TABS
1.0000 | ORAL_TABLET | Freq: Once | ORAL | Status: AC
  Administered 2020-12-10: 1 via ORAL
  Filled 2020-12-10: qty 1

## 2020-12-10 MED ORDER — FENTANYL CITRATE PF 50 MCG/ML IJ SOSY
50.0000 ug | PREFILLED_SYRINGE | Freq: Once | INTRAMUSCULAR | Status: DC
  Filled 2020-12-10: qty 1

## 2020-12-10 MED ORDER — DICLOFENAC SODIUM 1 % EX GEL: 2.0000 g | Freq: Four times a day (QID) | CUTANEOUS | 0 refills | Status: DC

## 2020-12-10 MED ORDER — MORPHINE SULFATE (PF) 4 MG/ML IV SOLN
2.0000 mg | Freq: Once | INTRAVENOUS | Status: AC
Start: 2020-12-10 — End: 2020-12-10
  Administered 2020-12-10: 2 mg via INTRAMUSCULAR
  Filled 2020-12-10: qty 1

## 2020-12-10 NOTE — ED Provider Notes (Signed)
[facility] EMERGENCY DEPARTMENT Provider Note   CSN: [id number] Arrival date & time: 12/10/20  1033     History Chief Complaint  Patient presents with   Knee Pain    Rhonda Garcia is a 80 y.o. female.  HPI  80 year old female with a history of atrial fibrillation on chronic anticoagulation, anemia, CAD, collagen vascular disease, depression, diabetes, diverticulosis, dyslipidemia, dysrhythmia, esophagitis, GERD, hiatal hernia, hyperlipidemia, hypertension, NSTEMI, osteopenia, sleep apnea, rheumatoid arthritis, who presents to the emergency department today for evaluation of right knee pain.  States that her knee locked up on her prior to arrival and she was not able to move it.  Pain is located anterior and posteriorly to the knee.  She states that this is happened before and resolved spontaneously on its own.  The pain is never lasted this long.  She states that prior to arrival her range of motion was worse but it has improved since arriving to the emergency department.  She denies any falls or recent trauma.  She has not missed any doses of her anticoagulation.  Past Medical History:  Diagnosis Date   A-fib ([place])    Anal fissure    resolved    Anemia    Back pain    with left radiculopathy   CAD (coronary artery disease)    Cataract    Collagen vascular disease (HCC)    DDD (degenerative disc disease)    Depression    Diabetes mellitus    Diverticulosis    Dyslipidemia    Dysrhythmia    AFib   Esophagitis    GERD (gastroesophageal reflux disease)    Hiatal hernia    Hx of peptic ulcer    Hyperlipidemia    Hypertension    Hypothyroidism    Internal hemorrhoids 2017   NSTEMI (non-ST elevated myocardial infarction) ([place]) 08/05/2015   Osteopenia    Sleep apnea    not using CPAP now, could not tolerate and PCP aware.   Thyroid disease     Patient Active Problem List   Diagnosis Date Noted   Rheumatoid factor positive 10/19/2020   Generalized osteoarthritis of  multiple sites 10/19/2020   Pain in left shoulder 10/19/2020   Erosive osteoarthritis of both hands 10/19/2020   Back pain with sciatica 05/04/2020   Bilateral primary osteoarthritis of knee 01/28/2020   Unintentional weight loss 05/05/2019   Abdominal pain 01/01/2019   Chest pain, rule out acute myocardial infarction 12/19/2018   Diabetes mellitus ([place]) 12/19/2018   History of atrial fibrillation 12/19/2018   Hx of non-ST elevation myocardial infarction (NSTEMI) 12/19/2018   Anemia 07/22/2018   Shortness of breath 01/09/2017   Obese 11/05/2016   Vertigo 06/27/2016   GAD (generalized anxiety disorder) 10/27/2015   Coronary artery disease involving native coronary artery of native heart with unstable angina pectoris ([place])    Hx of adenomatous colonic polyps 06/22/2015   Hypothyroidism 01/17/2015   Vitamin D deficiency 09/09/2014   Hiatal hernia    Dysphagia, pharyngoesophageal phase    History of colonic polyps    Diverticulosis of colon without hemorrhage    Paroxysmal atrial fibrillation ([place]) 11/02/2013   Depression 10/28/2012   Type 2 diabetes mellitus with hyperglycemia ([place]) 10/28/2012   Lipoma of back 02/12/2012   Constipation 02/28/2011   Esophageal dysphagia 02/28/2011   Gastroesophageal reflux 02/28/2011   POSITIONAL VERTIGO 04/29/2009   Osteoarthritis 12/23/2008   Hyperlipidemia associated with type 2 diabetes mellitus ([place]) 12/21/2008   Hypertension associated with diabetes ([place])  12/21/2008    Past Surgical History:  Procedure Laterality Date   ABDOMINAL HYSTERECTOMY Bilateral 2001 - approximate   APPENDECTOMY     BREAST REDUCTION SURGERY     CARDIAC CATHETERIZATION N/A 08/08/2015   Procedure: Left Heart Cath and Coronary Angiography;  Surgeon: Burnell Blanks, MD;  Location: [place] [facility];  Service: Cardiovascular;  Laterality: N/A;   CHOLECYSTECTOMY  2008 - approximate   COLONOSCOPY  06/2007   Friable anal canal and anal papillae. Pancolonic  diverticula. Normal terminal ileum. Status post segmental biopsy, descending colon biopsy showed minimal cryptitis. Biopsies felt to be  nonspecific but could be seen with NSAIDs. No features of inflammatory bowel disease. Stool studies were negative.   COLONOSCOPY  03/21/2011   RMR: colonic polyps treated as described above, colonic diverticulosis (Tubular adenomas)   COLONOSCOPY WITH PROPOFOL N/A 06/17/2014   RMR: Colonic diverticulosis. Multiple colonic polyps removed as described above.    COLONOSCOPY WITH PROPOFOL N/A 03/19/2016   Procedure: COLONOSCOPY WITH PROPOFOL;  Surgeon: Daneil Dolin, MD;  [facility];  Service: Endoscopy;  Laterality: N/A;  8:45am   COLONOSCOPY WITH PROPOFOL N/A 10/02/2018   Procedure: COLONOSCOPY WITH PROPOFOL;  Surgeon: Daneil Dolin, MD;  [facility];  Service: Endoscopy;  Laterality: N/A;  9:15am   CORONARY ANGIOPLASTY WITH STENT PLACEMENT     4 stents   ESOPHAGEAL DILATION N/A 06/17/2014   Procedure: ESOPHAGEAL DILATION;  Surgeon: Daneil Dolin, MD;  Location: [facility];  Service: Endoscopy;  Laterality: N/A;  Maloney 1 Fr   ESOPHAGOGASTRODUODENOSCOPY  06/2007   Small hiatal hernia   ESOPHAGOGASTRODUODENOSCOPY  03/21/2011   RMR: normal esophagus- status post passage of Maloney dilator. Small hiatal hernia. Trivial antral and bulbar erosions   ESOPHAGOGASTRODUODENOSCOPY (EGD) WITH PROPOFOL N/A 06/17/2014   RMR: Small hiatal hernia; otherwise normal EGD. Status post passage of a Maloney dilator. No explanation for patients right upper quadrant abdominal pain.    ESOPHAGOGASTRODUODENOSCOPY (EGD) WITH PROPOFOL N/A 03/19/2016   Procedure: ESOPHAGOGASTRODUODENOSCOPY (EGD) WITH PROPOFOL;  Surgeon: Daneil Dolin, MD;  [facility];  Service: Endoscopy;  Laterality: N/A;   KNEE SURGERY Left    arthroscopy   LIPOMA EXCISION  03/17/2012   Procedure: EXCISION LIPOMA;  Surgeon: Gwenyth Ober, MD;  Location: [place];   Service: General;  Laterality: Right;  excision of right lower back lipoma   MALONEY DILATION N/A 03/19/2016   Procedure: Venia Minks DILATION;  Surgeon: Daneil Dolin, MD;  [facility];  Service: Endoscopy;  Laterality: N/A;   POLYPECTOMY  06/17/2014   Procedure: POLYPECTOMY;  Surgeon: Daneil Dolin, MD;  Location: [facility];  Service: Endoscopy;;   POLYPECTOMY  10/02/2018   Procedure: POLYPECTOMY;  Surgeon: Daneil Dolin, MD;  [facility];  Service: Endoscopy;;  colon   REDUCTION MAMMAPLASTY Bilateral      OB History   No obstetric history on file.     Family History  Problem Relation Age of Onset   Heart attack Mother    Hypertension Mother    Stroke Mother        Deceased, age 59   Diabetes Sister    Arthritis Sister    Diabetes Sister    Cancer Sister        ovarian   Stroke Sister    Alzheimer's disease Sister    Diabetes Brother        also has a blood disorder  Clotting disorder Brother    Kidney disease Brother    Heart disease Brother    Melanoma Brother        deceased   Diabetes Son    Hypertension Son    Coronary artery disease Son 36       CABG   Stroke Son    Hypertension Son    Coronary artery disease Son 51       CABG   Colon cancer Neg Hx     Social History   Tobacco Use   Smoking status: Never   Smokeless tobacco: Never  Vaping Use   Vaping Use: Never used  Substance Use Topics   Alcohol use: Not Currently    Alcohol/week: 1.0 standard drink    Types: 1 Glasses of wine per week   Drug use: No    Home Medications Prior to Admission medications   Medication Sig Start Date End Date Taking? Authorizing Provider  diclofenac Sodium (VOLTAREN) 1 % GEL Apply 2 g topically 4 (four) times daily. 12/10/20  Yes Dametrius Sanjuan S, PA-C  acetaminophen (TYLENOL) 500 MG tablet Take 1 tablet (500 mg total) by mouth every 6 (six) hours as needed. 05/04/20   Ivy Lynn, NP  ALPRAZolam Duanne Moron) 0.5 MG tablet Take 1 tablet (0.5 mg  total) by mouth at bedtime as needed. 10/22/19   Sharion Balloon, FNP  B Complex Vitamins (VITAMIN B COMPLEX PO) Take by mouth.    [provider]  blood glucose meter kit and supplies Dispense based on patient and insurance preference. Use up to four times daily as directed. (FOR ICD-10 E10.9, E11.9). 12/03/19   Sharion Balloon, FNP  busPIRone (BUSPAR) 5 MG tablet TAKE ONE (1) TABLET THREE (3) TIMES EACH DAY 12/16/19   Evelina Dun A, FNP  diclofenac Sodium (VOLTAREN) 1 % GEL Apply 2 g topically 4 (four) times daily. 05/04/20   Ivy Lynn, NP  ELIQUIS 5 MG TABS tablet TAKE ONE TABLET BY MOUTH TWICE DAILY 09/13/20   Minus Breeding, MD  escitalopram (LEXAPRO) 10 MG tablet Take 1 tablet (10 mg total) by mouth daily. 01/10/17   Evelina Dun A, FNP  hydroxychloroquine (PLAQUENIL) 200 MG tablet Take 1 tablet (200 mg total) by mouth daily. 11/02/20   Collier Salina, MD  isosorbide mononitrate (IMDUR) 60 MG 24 hr tablet TAKE ONE (1) TABLET EACH DAY 11/15/20   Minus Breeding, MD  levothyroxine (SYNTHROID) 50 MCG tablet TAKE ONE (1) TABLET EACH DAY 10/13/20   Sharion Balloon, FNP  linaclotide [facility]) 72 MCG capsule Take 1 capsule (72 mcg total) by mouth daily before breakfast. 10/22/19   Evelina Dun A, FNP  lisinopril (ZESTRIL) 10 MG tablet TAKE ONE (1) TABLET EACH DAY 12/01/20   Evelina Dun A, FNP  metFORMIN (GLUCOPHAGE) 1000 MG tablet TAKE ONE TABLET BY MOUTH TWICE A DAY WITH FOOD 11/15/20   Evelina Dun A, FNP  metoprolol tartrate (LOPRESSOR) 50 MG tablet TAKE ONE TABLET BY MOUTH TWICE DAILY 09/13/20   Evelina Dun A, FNP  nitroGLYCERIN (NITROSTAT) 0.4 MG SL tablet Place 1 tablet (0.4 mg total) under the tongue every 5 (five) minutes as needed for chest pain. 08/29/18   Evelina Dun A, FNP  ondansetron (ZOFRAN) 4 MG tablet TAKE 1 TABLET BY MOUTH EVERY 8 HOURS AS NEEDED FOR NAUSEA & VOMITING 11/24/20   Evelina Dun A, FNP  pantoprazole (PROTONIX) 40 MG tablet TAKE ONE TABLET BY MOUTH  TWICE DAILY 11/15/20   Evelina Dun  A, FNP  pravastatin (PRAVACHOL) 80 MG tablet TAKE 1 TABLET DAILY IN THE EVENING 11/15/20   Hawks, Alyse Low A, FNP  tiZANidine (ZANAFLEX) 2 MG tablet Take 1 tablet (2 mg total) by mouth at bedtime as needed for muscle spasms. 11/02/20   Collier Salina, MD    Allergies    Fentanyl and Promethazine hcl  Review of Systems   Review of Systems  Constitutional:  Negative for fever.  Musculoskeletal:        Right knee pain  Skin:  Negative for color change.  Neurological:  Negative for weakness and numbness.   Physical Exam Updated Vital Signs BP (!) 157/57 (BP Location: Right Arm)   Pulse (!) 56   Temp 97.6 F (36.4 C) (Oral)   Resp 18   Ht 5' 1"  (1.549 m)   Wt 78 kg   SpO2 96%   BMI 32.50 kg/m   Physical Exam Vitals and nursing note reviewed.  Constitutional:      General: She is not in acute distress.    Appearance: She is well-developed.  HENT:     Head: Normocephalic and atraumatic.  Eyes:     Conjunctiva/sclera: Conjunctivae normal.  Cardiovascular:     Rate and Rhythm: Normal rate.  Pulmonary:     Effort: Pulmonary effort is normal.  Musculoskeletal:        General: Normal range of motion.     Cervical back: Neck supple.     Right lower leg: No edema.     Left lower leg: No edema.     Comments: TTP to the right anterior knee and along the right lateral joint line. No TTP to the posterior knee on exam. ROM of the right knee is intact. DP pulses 2+ and symmetric.  Skin:    General: Skin is warm and dry.  Neurological:     Mental Status: She is alert.    ED Results / Procedures / Treatments   Labs (all labs ordered are listed, but only abnormal results are displayed) Labs Reviewed - No data to display  EKG None  Radiology DG Knee Complete 4 Views Right  Result Date: 12/10/2020 CLINICAL DATA:  Right-sided knee pain. EXAM: RIGHT KNEE - COMPLETE 4+ VIEW COMPARISON:  None. FINDINGS: No evidence of fracture, dislocation, or  joint effusion. There is mild tricompartmental osteoarthritis. Superior and inferior patellar enthesophytes are noted. Soft tissues are unremarkable. IMPRESSION: Mild degenerative changes.  No acute osseous injury. Electronically Signed   By: Zerita Boers M.D.   On: 12/10/2020 11:50    Procedures Procedures   Medications Ordered in ED Medications  HYDROcodone-acetaminophen (NORCO/VICODIN) 5-325 MG per tablet 1 tablet (1 tablet Oral Given 12/10/20 1131)  morphine 4 MG/ML injection 2 mg (2 mg Intramuscular Given 12/10/20 1139)    ED Course  I have reviewed the triage vital signs and the nursing notes.  Pertinent labs & imaging results that were available during my care of the patient were reviewed by me and considered in my medical decision making (see chart for details).    MDM Rules/Calculators/A&P                          80 y/o female presenting for eval of right knee pain and decreased ROM that started PTA. Denies injury, falls or trauma. Has improved since onset.   Dp pulses intact, low suspicion for arterial injury. Low suspicion for dvt given current compliance with anticoagulation. Suspect msk cause.  Will obtain xray, provide analgesia and reassess.   Xray reviewed/interpreted - Mild degenerative changes.  No acute osseous injury  Pt was able to ambulate in the ed with minimal assistance after being given pain meds in the emergency department. We will give voltaren gel and a knee sleeve for home. Have advised pcp f/u and strict return precautions. She voices understanding of the plan and reasons to return. All questions answered, pt stable for discharge.  Final Clinical Impression(s) / ED Diagnoses Final diagnoses:  Acute pain of right knee    Rx / DC Orders ED Discharge Orders          Ordered    diclofenac Sodium (VOLTAREN) 1 % GEL  4 times daily        12/10/20 [id number] Gillie Fleites S, PA-C 12/10/20 1223    Fredia Sorrow, MD 12/11/20 [phone]

## 2020-12-10 NOTE — Discharge Instructions (Addendum)
Use voltaren gel as directed   Please follow up with your primary care provider within 5-7 days for re-evaluation of your symptoms. If you do not have a primary care provider, information for a healthcare clinic has been provided for you to make arrangements for follow up care. Please return to the emergency department for any new or worsening symptoms.

## 2020-12-10 NOTE — ED Triage Notes (Signed)
Pt presents to ED via EMS for right knee pain that started yesterday. Pt states her knee locked up yesterday and she has been unable to bend her knee.

## 2020-12-10 NOTE — ED Notes (Signed)
Pt ambulated to restroom. 

## 2020-12-12 ENCOUNTER — Other Ambulatory Visit: Payer: Self-pay | Admitting: Internal Medicine

## 2020-12-12 ENCOUNTER — Other Ambulatory Visit: Payer: Self-pay | Admitting: Family

## 2020-12-12 DIAGNOSIS — K219 Gastro-esophageal reflux disease without esophagitis: Secondary | ICD-10-CM

## 2020-12-12 DIAGNOSIS — M154 Erosive (osteo)arthritis: Secondary | ICD-10-CM

## 2020-12-12 DIAGNOSIS — M549 Dorsalgia, unspecified: Secondary | ICD-10-CM

## 2020-12-12 DIAGNOSIS — M5430 Sciatica, unspecified side: Secondary | ICD-10-CM

## 2020-12-14 NOTE — Progress Notes (Signed)
Office Visit Note  Patient: Rhonda Garcia             Date of Birth: 12/09/1940           MRN: [id number]             PCP: Sharion Balloon, FNP Referring: Sharion Balloon, FNP Visit Date: 12/15/2020   Subjective:  Pain of the Right Knee (Right knee locking, pain and swelling, right groin and right hip pain,  went to [facility] x 5 days)   History of Present Illness: Rhonda Garcia is a 80 y.o. female previously seen for erosive osteoarthritis of the hands, generalized OA, and positive rheumatoid factor here for follow up with right knee locking up and pain evaluated at the ED last week with xray showing mild degenerative arthritis otherwise normal.  She been several days pretty much immobile mostly lying in bed with significant pain at and inability to move her right leg.  She reports during transport to hospital emergency medical technician fully extended the knee causing a pop and shift of position and was subsequently able to move the knee again.  Since that visit she is wearing a soft knee brace for support has been moving well though she notices some continued tenderness without significant swelling at the knee.  Previous HPI: 11/02/20 Rhonda Garcia is a 80 y.o. 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  Review of x-rays in the last visit was consistent with erosive OA in the bilateral hands.  She complains of ongoing pain particularly in the low back and bilateral hips this bothers her throughout the day but especially at night lying on either side worsens the hip pain.  Currently she is on no particular effective medicine for this due to avoiding NSAIDs on chronic anticoagulation and mild decreased renal function.  10/19/20 Rhonda Garcia is a 80 y.o. female here for positive rheumatoid factor and multiple joint pains.  She reports a history of chronic joint pains present for the past few years but more recently  has noticed increasing nodules and joint position deviations visible in her bilateral hands.  She also has pain frequently affecting her bilateral knees these do swell intermittently.  She has noticed occasional catching of the knees denies any falling or giving way.  She feels persistently fatigued and that joint symptoms do interfere with her activity level.  She is used topical Voltaren with small benefit.  She does not take oral NSAIDs due to other medication including chronic anticoagulation on Eliquis and hypertension with lisinopril.   Labs reviewed 08/2020 RF 51.5 Uric acid 6.4 ESR 27 CMP eGFR 57   Review of Systems  Constitutional:  Positive for fatigue.  HENT:  Positive for mouth dryness.   Eyes:  Positive for dryness.  Respiratory:  Positive for shortness of breath.   Cardiovascular:  Positive for swelling in legs/feet.  Gastrointestinal:  Positive for constipation.  Endocrine: Positive for heat intolerance, excessive thirst and increased urination.  Genitourinary:  Negative for difficulty urinating.  Musculoskeletal:  Positive for joint pain, gait problem, joint pain, joint swelling, muscle weakness, morning stiffness and muscle tenderness.  Skin:  Negative for rash.  Allergic/Immunologic: Negative for susceptible to infections.  Neurological:  Positive for numbness and weakness.  Hematological:  Negative for bruising/bleeding tendency.  Psychiatric/Behavioral:  Positive for sleep disturbance.    PMFS History:  Patient Active Problem List   Diagnosis Date  Noted   Instability of right knee joint 12/15/2020   Rheumatoid factor positive 10/19/2020   Generalized osteoarthritis of multiple sites 10/19/2020   Pain in left shoulder 10/19/2020   Erosive osteoarthritis of both hands 10/19/2020   Back pain with sciatica 05/04/2020   Bilateral primary osteoarthritis of knee 01/28/2020   Unintentional weight loss 05/05/2019   Abdominal pain 01/01/2019   Chest pain, rule out acute  myocardial infarction 12/19/2018   Diabetes mellitus ([place]) 12/19/2018   History of atrial fibrillation 12/19/2018   Hx of non-ST elevation myocardial infarction (NSTEMI) 12/19/2018   Anemia 07/22/2018   Shortness of breath 01/09/2017   Obese 11/05/2016   Vertigo 06/27/2016   GAD (generalized anxiety disorder) 10/27/2015   Coronary artery disease involving native coronary artery of native heart with unstable angina pectoris ([place])    Hx of adenomatous colonic polyps 06/22/2015   Hypothyroidism 01/17/2015   Vitamin D deficiency 09/09/2014   Hiatal hernia    Dysphagia, pharyngoesophageal phase    History of colonic polyps    Diverticulosis of colon without hemorrhage    Paroxysmal atrial fibrillation ([place]) 11/02/2013   Depression 10/28/2012   Type 2 diabetes mellitus with hyperglycemia ([place]) 10/28/2012   Lipoma of back 02/12/2012   Constipation 02/28/2011   Esophageal dysphagia 02/28/2011   Gastroesophageal reflux 02/28/2011   POSITIONAL VERTIGO 04/29/2009   Osteoarthritis 12/23/2008   Hyperlipidemia associated with type 2 diabetes mellitus ([place]) 12/21/2008   Hypertension associated with diabetes ([place]) 12/21/2008    Past Medical History:  Diagnosis Date   A-fib ([place])    Anal fissure    resolved    Anemia    Back pain    with left radiculopathy   CAD (coronary artery disease)    Cataract    Collagen vascular disease ([place])    DDD (degenerative disc disease)    Depression    Diabetes mellitus    Diverticulosis    Dyslipidemia    Dysrhythmia    AFib   Esophagitis    GERD (gastroesophageal reflux disease)    Hiatal hernia    Hx of peptic ulcer    Hyperlipidemia    Hypertension    Hypothyroidism    Internal hemorrhoids 2017   NSTEMI (non-ST elevated myocardial infarction) ([place]) 08/05/2015   Osteopenia    Sleep apnea    not using CPAP now, could not tolerate and PCP aware.   Thyroid disease     Family History  Problem Relation Age of Onset   Heart attack Mother     Hypertension Mother    Stroke Mother        Deceased, age 31   Diabetes Sister    Arthritis Sister    Diabetes Sister    Cancer Sister        ovarian   Stroke Sister    Alzheimer's disease Sister    Diabetes Brother        also has a blood disorder    Clotting disorder Brother    Kidney disease Brother    Heart disease Brother    Melanoma Brother        deceased   Diabetes Son    Hypertension Son    Coronary artery disease Son 46       CABG   Stroke Son    Hypertension Son    Coronary artery disease Son 52       CABG   Colon cancer Neg Hx    Past Surgical History:  Procedure Laterality  Date   ABDOMINAL HYSTERECTOMY Bilateral 2001 - approximate   APPENDECTOMY     BREAST REDUCTION SURGERY     CARDIAC CATHETERIZATION N/A 08/08/2015   Procedure: Left Heart Cath and Coronary Angiography;  Surgeon: Burnell Blanks, MD;  Location: [facility];  Service: Cardiovascular;  Laterality: N/A;   CHOLECYSTECTOMY  2008 - approximate   COLONOSCOPY  06/2007   Friable anal canal and anal papillae. Pancolonic diverticula. Normal terminal ileum. Status post segmental biopsy, descending colon biopsy showed minimal cryptitis. Biopsies felt to be  nonspecific but could be seen with NSAIDs. No features of inflammatory bowel disease. Stool studies were negative.   COLONOSCOPY  03/21/2011   RMR: colonic polyps treated as described above, colonic diverticulosis (Tubular adenomas)   COLONOSCOPY WITH PROPOFOL N/A 06/17/2014   RMR: Colonic diverticulosis. Multiple colonic polyps removed as described above.    COLONOSCOPY WITH PROPOFOL N/A 03/19/2016   Procedure: COLONOSCOPY WITH PROPOFOL;  Surgeon: Daneil Dolin, MD;  [facility];  Service: Endoscopy;  Laterality: N/A;  8:45am   COLONOSCOPY WITH PROPOFOL N/A 10/02/2018   Procedure: COLONOSCOPY WITH PROPOFOL;  Surgeon: Daneil Dolin, MD;  [facility];  Service: Endoscopy;  Laterality: N/A;  9:15am   CORONARY  ANGIOPLASTY WITH STENT PLACEMENT     4 stents   ESOPHAGEAL DILATION N/A 06/17/2014   Procedure: ESOPHAGEAL DILATION;  Surgeon: Daneil Dolin, MD;  Location: [facility];  Service: Endoscopy;  Laterality: N/A;  Maloney 65 Fr   ESOPHAGOGASTRODUODENOSCOPY  06/2007   Small hiatal hernia   ESOPHAGOGASTRODUODENOSCOPY  03/21/2011   RMR: normal esophagus- status post passage of Maloney dilator. Small hiatal hernia. Trivial antral and bulbar erosions   ESOPHAGOGASTRODUODENOSCOPY (EGD) WITH PROPOFOL N/A 06/17/2014   RMR: Small hiatal hernia; otherwise normal EGD. Status post passage of a Maloney dilator. No explanation for patients right upper quadrant abdominal pain.    ESOPHAGOGASTRODUODENOSCOPY (EGD) WITH PROPOFOL N/A 03/19/2016   Procedure: ESOPHAGOGASTRODUODENOSCOPY (EGD) WITH PROPOFOL;  Surgeon: Daneil Dolin, MD;  [facility];  Service: Endoscopy;  Laterality: N/A;   KNEE SURGERY Left    arthroscopy   LIPOMA EXCISION  03/17/2012   Procedure: EXCISION LIPOMA;  Surgeon: Gwenyth Ober, MD;  Location: [facility];  Service: General;  Laterality: Right;  excision of right lower back lipoma   MALONEY DILATION N/A 03/19/2016   Procedure: Venia Minks DILATION;  Surgeon: Daneil Dolin, MD;  [facility];  Service: Endoscopy;  Laterality: N/A;   POLYPECTOMY  06/17/2014   Procedure: POLYPECTOMY;  Surgeon: Daneil Dolin, MD;  Location: [facility];  Service: Endoscopy;;   POLYPECTOMY  10/02/2018   Procedure: POLYPECTOMY;  Surgeon: Daneil Dolin, MD;  [facility];  Service: Endoscopy;;  colon   REDUCTION MAMMAPLASTY Bilateral    Social History   Social History Narrative    Has 4 adult children. She lives home with one of her sons who recently had a stroke and needs 24 hour care. She is the primary caregiver and is now legally separated from her husband.    Immunization History  Administered Date(s) Administered   Fluad Quad(high Dose 65+) 12/19/2018, 03/23/2020    Influenza Split 01/13/2015, 12/31/2016   Influenza, High Dose Seasonal PF 02/07/2015, 01/21/2017, 02/12/2018   Influenza,inj,Quad PF,6+ Mos 01/19/2013, 05/20/2014, 01/10/2016   Moderna Sars-Covid-2 Vaccination 07/02/2019, 08/04/2019   Pneumococcal Conjugate-13 08/13/2013   Pneumococcal Polysaccharide-23 02/07/2015   Tdap 12/18/2012     Objective: Vital  Signs: BP (!) 176/72 (BP Location: Left Arm, Patient Position: Sitting, Cuff Size: Normal)   Pulse 64   Resp 16   Ht _0  (1.549 m)   Wt 176 lb (79.8 kg)   BMI 33.25 kg/m    Physical Exam Constitutional:      Appearance: She is obese.  Cardiovascular:     Rate and Rhythm: Normal rate and regular rhythm.  Pulmonary:     Breath sounds: Normal breath sounds.  Musculoskeletal:     Right lower leg: No edema.     Left lower leg: No edema.  Skin:    General: Skin is warm and dry.     Findings: No rash.  Neurological:     Mental Status: She is alert.  Psychiatric:        Mood and Affect: Mood normal.     Musculoskeletal Exam: Right knee medial joint line tenderness to pressure, pain with valgus pressure but no significant laxity, negative lachman maneuver, no warmth or large effusion Ankles full ROM no tenderness or swelling   Investigation: No additional findings.  Imaging: DG Knee Complete 4 Views Right  Result Date: 12/10/2020 CLINICAL DATA:  Right-sided knee pain. EXAM: RIGHT KNEE - COMPLETE 4+ VIEW COMPARISON:  None. FINDINGS: No evidence of fracture, dislocation, or joint effusion. There is mild tricompartmental osteoarthritis. Superior and inferior patellar enthesophytes are noted. Soft tissues are unremarkable. IMPRESSION: Mild degenerative changes.  No acute osseous injury. Electronically Signed   By: Zerita Boers M.D.   On: 12/10/2020 11:50    Recent Labs: Lab Results  Component Value Date   WBC 5.8 08/30/2020   HGB 12.3 08/30/2020   PLT 155 08/30/2020   NA 142 11/22/2020   K 4.6 11/22/2020   CL 105  11/22/2020   CO2 25 11/22/2020   GLUCOSE 137 (H) 11/22/2020   BUN 10 11/22/2020   CREATININE 0.95 11/22/2020   BILITOT 0.7 11/22/2020   ALKPHOS 85 11/22/2020   AST 17 11/22/2020   ALT 9 11/22/2020   PROT 6.5 11/22/2020   ALBUMIN 4.3 11/22/2020   CALCIUM 9.2 11/22/2020   GFRAA 63 03/23/2020    Speciality Comments: No specialty comments available.  Procedures:  Large Joint Inj: R knee on 12/15/2020 11:40 AM Indications: pain Details: 25 G 1.5 in needle, anterior approach Medications: 4 mL lidocaine 1 %; 40 mg triamcinolone acetonide 40 MG/ML Aspirate: clear Procedure, treatment alternatives, risks and benefits explained, specific risks discussed. Consent was given by the patient. Immediately prior to procedure a time out was called to verify the correct patient, procedure, equipment, support staff and site/side marked as required. Patient was prepped and draped in the usual sterile fashion.    Allergies: Fentanyl and Promethazine hcl   Assessment / Plan:     Visit Diagnoses: Bilateral primary osteoarthritis of knee  Instability of right knee joint - Plan: MR KNEE RIGHT WO CONTRAST  Popping event with subsequent immobility for 5 days and some ongoing pain still at this time. 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 No prior advanced imaging or procedures of the right knee. For current pain preformed local steroid injection, patient wearing soft brace for support, and recommended she keep up some simple stretching and range of motion exercising but avoid any heavy resistance during next 3 weeks. Recommend getting MRI of right knee to assess ligament and meniscus condition suspect for degenerative tear or insufficiency.  Orders: Orders Placed This Encounter  Procedures  Large Joint Inj   MR KNEE RIGHT WO CONTRAST    No orders of the defined types were placed in this encounter.    Follow-Up Instructions: Return in about 3 weeks  (around 01/05/2021) for R Knee f/u 3wks.   Collier Salina, MD  Note - This record has been created using [employer].  Chart creation errors have been sought, but may not always  have been located. Such creation errors do not reflect on  the standard of medical care.

## 2020-12-15 ENCOUNTER — Encounter: Payer: Self-pay | Admitting: Internal Medicine

## 2020-12-15 ENCOUNTER — Ambulatory Visit: Payer: Medicare Other | Admitting: Internal Medicine

## 2020-12-15 ENCOUNTER — Other Ambulatory Visit: Payer: Self-pay

## 2020-12-15 VITALS — BP 176/72 | HR 64 | Resp 16 | Ht 61.0 in | Wt 176.0 lb

## 2020-12-15 DIAGNOSIS — M1712 Unilateral primary osteoarthritis, left knee: Secondary | ICD-10-CM

## 2020-12-15 DIAGNOSIS — M170 Bilateral primary osteoarthritis of knee: Secondary | ICD-10-CM

## 2020-12-15 DIAGNOSIS — M25361 Other instability, right knee: Secondary | ICD-10-CM | POA: Diagnosis not present

## 2020-12-15 DIAGNOSIS — M1711 Unilateral primary osteoarthritis, right knee: Secondary | ICD-10-CM | POA: Diagnosis not present

## 2020-12-15 NOTE — Patient Instructions (Signed)
Joint Steroid Injection A joint steroid injection is a procedure to relieve swelling and pain in a joint. Steroids are medicines that reduce inflammation. In this procedure, your health care provider uses a syringe and a needle to inject a steroid medicine into a painful and inflamed joint. A pain-relieving medicine (anesthetic) may be injected along with the steroid. In some cases, your health care provider may use an imaging technique such as ultrasound or fluoroscopy toguide the injection. Joints that are often treated with steroid injections include the knee, shoulder, hip, and spine. These injections may also be used in the elbow, ankle, and joints of the hands or feet. You may have joint steroid injections as part of your treatment for inflammation caused by: Gout. Rheumatoid arthritis. Advanced wear-and-tear arthritis (osteoarthritis). Tendinitis. Bursitis. Joint steroid injections may be repeated, but having them too often can damage a joint or the skin over the joint. You should not have joint steroidinjections less than 6 weeks apart or more than four times a year. Tell a health care provider about: Any allergies you have. All medicines you are taking, including vitamins, herbs, eye drops, creams, and over-the-counter medicines. Any problems you or family members have had with anesthetic medicines. Any blood disorders you have. Any surgeries you have had. Any medical conditions you have. Whether you are pregnant or may be pregnant. What are the risks? Generally, this is a safe treatment. However, problems may occur, including: Infection. Bleeding. Allergic reactions to medicines. Damage to the joint or tissues around the joint. Thinning of skin or loss of skin color over the joint. Temporary flushing of the face or chest. Temporary increase in pain. Temporary increase in blood sugar. Failure to relieve inflammation or pain. What happens before the treatment? Medicines Ask  your health care provider about: Changing or stopping your regular medicines. This is especially important if you are taking diabetes medicines or blood thinners. Taking medicines such as aspirin and ibuprofen. These medicines can thin your blood. Do not take these medicines unless your health care provider tells you to take them. Taking over-the-counter medicines, vitamins, herbs, and supplements. General instructions You may have imaging tests of your joint. Ask your health care provider if you can drive yourself home after the procedure. What happens during the treatment?  Your health care provider will position you for the injection and locate the injection site over your joint. The skin over the joint will be cleaned with a germ-killing soap. Your health care provider may: Spray a numbing solution (topical anesthetic) over the injection site. Inject a local anesthetic under the skin above your joint. The needle will be placed through your skin into your joint. Your health care provider may use imaging to guide the needle to the right spot for the injection. If imaging is used, a special contrast dye may be injected to confirm that the needle is in the correct location. The steroid medicine will be injected into your joint. Anesthetic may be injected along with the steroid. This may be a medicine that relieves pain for a short time (short-acting anesthetic) or for a longer time (long-acting anesthetic). The needle will be removed, and an adhesive bandage (dressing) will be placed over the injection site. The procedure may vary among health care providers and hospitals. What can I expect after the treatment? You will be able to go home after the treatment. It is normal to feel slight flushing for a few days after the injection. After the treatment, it is common to   have an increase in joint pain after the anesthetic has worn off. This may happen about an hour after a short-acting anesthetic  or about 8 hours after a longer-acting anesthetic. You should begin to feel relief from joint pain and swelling after 24 to 48 hours. Contact your health care provider if you do not begin to feel relief after 2 days. Follow these instructions at home: Injection site care Leave the adhesive dressing over your injection site in place until your health care provider says you can remove it. Check your injection site every day for signs of infection. Check for: More redness, swelling, or pain. Fluid or blood. Warmth. Pus or a bad smell. Activity Return to your normal activities as told by your health care provider. Ask your health care provider what activities are safe for you. You may be asked to limit activities that put stress on the joint for a few days. Do joint exercises as told by your health care provider. Do not take baths, swim, or use a hot tub until your health care provider approves. Ask your health care provider if you may take showers. You may only be allowed to take sponge baths. Managing pain, stiffness, and swelling  If directed, put ice on the joint. To do this: Put ice in a plastic bag. Place a towel between your skin and the bag. Leave the ice on for 20 minutes, 2-3 times a day. Remove the ice if your skin turns bright red. This is very important. If you cannot feel pain, heat, or cold, you have a greater risk of damage to the area. Raise (elevate) your joint above the level of your heart when you are sitting or lying down.  General instructions Take over-the-counter and prescription medicines only as told by your health care provider. Do not use any products that contain nicotine or tobacco, such as cigarettes, e-cigarettes, and chewing tobacco. These can delay joint healing. If you need help quitting, ask your health care provider. If you have diabetes, be aware that your blood sugar may be slightly elevated for several days after the injection. Keep all follow-up visits.  This is important. Contact a health care provider if you have: Chills or a fever. Any signs of infection at your injection site. Increased pain or swelling or no relief after 2 days. Summary A joint steroid injection is a treatment to relieve pain and swelling in a joint. Steroids are medicines that reduce inflammation. Your health care provider may add an anesthetic along with the steroid. You may have joint steroid injections as part of your arthritis treatment. Joint steroid injections may be repeated, but having them too often can damage a joint or the skin over the joint. Contact your health care provider if you have a fever, chills, or signs of infection, or if you get no relief from joint pain or swelling. This information is not intended to replace advice given to you by your health care provider. Make sure you discuss any questions you have with your healthcare provider. Document Revised: 09/04/2019 Document Reviewed: 09/04/2019 Elsevier Patient Education  2022 [employer]  

## 2020-12-16 MED ORDER — LIDOCAINE HCL 1 % IJ SOLN
4.0000 mL | INTRAMUSCULAR | Status: AC | PRN
  Administered 2020-12-15: 4 mL

## 2020-12-16 MED ORDER — TRIAMCINOLONE ACETONIDE 40 MG/ML IJ SUSP
40.0000 mg | INTRAMUSCULAR | Status: AC | PRN
  Administered 2020-12-15: 40 mg via INTRA_ARTICULAR

## 2020-12-26 ENCOUNTER — Emergency Department (HOSPITAL_COMMUNITY)
Admission: EM | Admit: 2020-12-26 | Discharge: 2020-12-26 | Disposition: A | Discharge status: Home / Self Care | Payer: Medicare Other | Attending: Emergency Medicine | Admitting: Emergency Medicine

## 2020-12-26 ENCOUNTER — Emergency Department (HOSPITAL_COMMUNITY)
Admission: RE | Admit: 2020-12-26 | Discharge: 2020-12-26 | Disposition: A | Discharge status: Home / Self Care | Payer: Medicare Other | Source: Ambulatory Visit | Attending: Internal Medicine | Admitting: Internal Medicine

## 2020-12-26 ENCOUNTER — Other Ambulatory Visit: Payer: Self-pay

## 2020-12-26 DIAGNOSIS — E785 Hyperlipidemia, unspecified: Secondary | ICD-10-CM | POA: Diagnosis not present

## 2020-12-26 DIAGNOSIS — E039 Hypothyroidism, unspecified: Secondary | ICD-10-CM | POA: Diagnosis not present

## 2020-12-26 DIAGNOSIS — X58XXXA Exposure to other specified factors, initial encounter: Secondary | ICD-10-CM | POA: Diagnosis not present

## 2020-12-26 DIAGNOSIS — Z7902 Long term (current) use of antithrombotics/antiplatelets: Secondary | ICD-10-CM | POA: Insufficient documentation

## 2020-12-26 DIAGNOSIS — M170 Bilateral primary osteoarthritis of knee: Secondary | ICD-10-CM | POA: Insufficient documentation

## 2020-12-26 DIAGNOSIS — Z7984 Long term (current) use of oral hypoglycemic drugs: Secondary | ICD-10-CM | POA: Diagnosis not present

## 2020-12-26 DIAGNOSIS — I10 Essential (primary) hypertension: Secondary | ICD-10-CM | POA: Insufficient documentation

## 2020-12-26 DIAGNOSIS — I2510 Atherosclerotic heart disease of native coronary artery without angina pectoris: Secondary | ICD-10-CM | POA: Insufficient documentation

## 2020-12-26 DIAGNOSIS — Z79899 Other long term (current) drug therapy: Secondary | ICD-10-CM | POA: Diagnosis not present

## 2020-12-26 DIAGNOSIS — E1169 Type 2 diabetes mellitus with other specified complication: Secondary | ICD-10-CM | POA: Diagnosis not present

## 2020-12-26 DIAGNOSIS — S83251A Bucket-handle tear of lateral meniscus, current injury, right knee, initial encounter: Secondary | ICD-10-CM | POA: Diagnosis not present

## 2020-12-26 DIAGNOSIS — M25361 Other instability, right knee: Secondary | ICD-10-CM | POA: Insufficient documentation

## 2020-12-26 DIAGNOSIS — M25561 Pain in right knee: Secondary | ICD-10-CM | POA: Insufficient documentation

## 2020-12-26 DIAGNOSIS — I4891 Unspecified atrial fibrillation: Secondary | ICD-10-CM | POA: Insufficient documentation

## 2020-12-26 DIAGNOSIS — M1711 Unilateral primary osteoarthritis, right knee: Secondary | ICD-10-CM | POA: Diagnosis not present

## 2020-12-26 MED ORDER — HYDROCODONE-ACETAMINOPHEN 5-325 MG PO TABS: 1.0000 | ORAL_TABLET | ORAL | 0 refills | Status: AC | PRN

## 2020-12-26 NOTE — ED Triage Notes (Signed)
Patent c/o pain in right knee. States knee locked up while she was getting ready for an MRI OF KNEE, MRI NOTIFIED.

## 2020-12-27 NOTE — ED Provider Notes (Signed)
[facility] Provider Note   CSN: [id number] Arrival date & time: 12/26/20  1348     History No chief complaint on file.   ALISYN LEQUIRE is a 80 y.o. female.  Pt reports her right knee is locked up.  Pt reports she can not straighten out leg.  Pt just had an MRI.  Pt's rheumatologist  has seen her for the same.  Pt reports difficulty walking and she does not feel like the brace supports her leg enough.    The history is provided by the patient. No language interpreter was used.  Knee Pain Location:  Knee Time since incident:  2 weeks Injury: no   Knee location:  R knee Pain details:    Quality:  Aching   Severity:  Moderate   Onset quality:  Gradual   Timing:  Constant   Progression:  Worsening Chronicity:  New Dislocation: no   Relieved by:  Nothing Worsened by:  Nothing Ineffective treatments:  None tried Associated symptoms: no fever   Risk factors: no concern for non-accidental trauma       Past Medical History:  Diagnosis Date   A-fib ([place])    Anal fissure    resolved    Anemia    Back pain    with left radiculopathy   CAD (coronary artery disease)    Cataract    Collagen vascular disease (HCC)    DDD (degenerative disc disease)    Depression    Diabetes mellitus    Diverticulosis    Dyslipidemia    Dysrhythmia    AFib   Esophagitis    GERD (gastroesophageal reflux disease)    Hiatal hernia    Hx of peptic ulcer    Hyperlipidemia    Hypertension    Hypothyroidism    Internal hemorrhoids 2017   NSTEMI (non-ST elevated myocardial infarction) ([place]) 08/05/2015   Osteopenia    Sleep apnea    not using CPAP now, could not tolerate and PCP aware.   Thyroid disease     Patient Active Problem List   Diagnosis Date Noted   Instability of right knee joint 12/15/2020   Rheumatoid factor positive 10/19/2020   Generalized osteoarthritis of multiple sites 10/19/2020   Pain in left shoulder 10/19/2020   Erosive osteoarthritis of  both hands 10/19/2020   Back pain with sciatica 05/04/2020   Bilateral primary osteoarthritis of knee 01/28/2020   Unintentional weight loss 05/05/2019   Abdominal pain 01/01/2019   Chest pain, rule out acute myocardial infarction 12/19/2018   Diabetes mellitus ([place]) 12/19/2018   History of atrial fibrillation 12/19/2018   Hx of non-ST elevation myocardial infarction (NSTEMI) 12/19/2018   Anemia 07/22/2018   Shortness of breath 01/09/2017   Obese 11/05/2016   Vertigo 06/27/2016   GAD (generalized anxiety disorder) 10/27/2015   Coronary artery disease involving native coronary artery of native heart with unstable angina pectoris ([place])    Hx of adenomatous colonic polyps 06/22/2015   Hypothyroidism 01/17/2015   Vitamin D deficiency 09/09/2014   Hiatal hernia    Dysphagia, pharyngoesophageal phase    History of colonic polyps    Diverticulosis of colon without hemorrhage    Paroxysmal atrial fibrillation ([place]) 11/02/2013   Depression 10/28/2012   Type 2 diabetes mellitus with hyperglycemia ([place]) 10/28/2012   Lipoma of back 02/12/2012   Constipation 02/28/2011   Esophageal dysphagia 02/28/2011   Gastroesophageal reflux 02/28/2011   POSITIONAL VERTIGO 04/29/2009   Osteoarthritis 12/23/2008   Hyperlipidemia associated  with type 2 diabetes mellitus ([place]) 12/21/2008   Hypertension associated with diabetes ([facility]) 12/21/2008    Past Surgical History:  Procedure Laterality Date   ABDOMINAL HYSTERECTOMY Bilateral 2001 - approximate   APPENDECTOMY     BREAST REDUCTION SURGERY     CARDIAC CATHETERIZATION N/A 08/08/2015   Procedure: Left Heart Cath and Coronary Angiography;  Surgeon: Burnell Blanks, MD;  Location: [facility];  Service: Cardiovascular;  Laterality: N/A;   CHOLECYSTECTOMY  2008 - approximate   COLONOSCOPY  06/2007   Friable anal canal and anal papillae. Pancolonic diverticula. Normal terminal ileum. Status post segmental biopsy, descending colon biopsy showed  minimal cryptitis. Biopsies felt to be  nonspecific but could be seen with NSAIDs. No features of inflammatory bowel disease. Stool studies were negative.   COLONOSCOPY  03/21/2011   RMR: colonic polyps treated as described above, colonic diverticulosis (Tubular adenomas)   COLONOSCOPY WITH PROPOFOL N/A 06/17/2014   RMR: Colonic diverticulosis. Multiple colonic polyps removed as described above.    COLONOSCOPY WITH PROPOFOL N/A 03/19/2016   Procedure: COLONOSCOPY WITH PROPOFOL;  Surgeon: Daneil Dolin, MD;  [facility];  Service: Endoscopy;  Laterality: N/A;  8:45am   COLONOSCOPY WITH PROPOFOL N/A 10/02/2018   Procedure: COLONOSCOPY WITH PROPOFOL;  Surgeon: Daneil Dolin, MD;  [facility];  Service: Endoscopy;  Laterality: N/A;  9:15am   CORONARY ANGIOPLASTY WITH STENT PLACEMENT     4 stents   ESOPHAGEAL DILATION N/A 06/17/2014   Procedure: ESOPHAGEAL DILATION;  Surgeon: Daneil Dolin, MD;  Location: [facility];  Service: Endoscopy;  Laterality: N/A;  Maloney 53 Fr   ESOPHAGOGASTRODUODENOSCOPY  06/2007   Small hiatal hernia   ESOPHAGOGASTRODUODENOSCOPY  03/21/2011   RMR: normal esophagus- status post passage of Maloney dilator. Small hiatal hernia. Trivial antral and bulbar erosions   ESOPHAGOGASTRODUODENOSCOPY (EGD) WITH PROPOFOL N/A 06/17/2014   RMR: Small hiatal hernia; otherwise normal EGD. Status post passage of a Maloney dilator. No explanation for patients right upper quadrant abdominal pain.    ESOPHAGOGASTRODUODENOSCOPY (EGD) WITH PROPOFOL N/A 03/19/2016   Procedure: ESOPHAGOGASTRODUODENOSCOPY (EGD) WITH PROPOFOL;  Surgeon: Daneil Dolin, MD;  [facility];  Service: Endoscopy;  Laterality: N/A;   KNEE SURGERY Left    arthroscopy   LIPOMA EXCISION  03/17/2012   Procedure: EXCISION LIPOMA;  Surgeon: Gwenyth Ober, MD;  Location: [facility];  Service: General;  Laterality: Right;  excision of right lower back lipoma   MALONEY DILATION N/A  03/19/2016   Procedure: Venia Minks DILATION;  Surgeon: Daneil Dolin, MD;  [facility];  Service: Endoscopy;  Laterality: N/A;   POLYPECTOMY  06/17/2014   Procedure: POLYPECTOMY;  Surgeon: Daneil Dolin, MD;  Location: [facility];  Service: Endoscopy;;   POLYPECTOMY  10/02/2018   Procedure: POLYPECTOMY;  Surgeon: Daneil Dolin, MD;  [facility];  Service: Endoscopy;;  colon   REDUCTION MAMMAPLASTY Bilateral      OB History   No obstetric history on file.     Family History  Problem Relation Age of Onset   Heart attack Mother    Hypertension Mother    Stroke Mother        Deceased, age 68   Diabetes Sister    Arthritis Sister    Diabetes Sister    Cancer Sister        ovarian   Stroke Sister    Alzheimer's disease Sister    Diabetes  Brother        also has a blood disorder    Clotting disorder Brother    Kidney disease Brother    Heart disease Brother    Melanoma Brother        deceased   Diabetes Son    Hypertension Son    Coronary artery disease Son 30       CABG   Stroke Son    Hypertension Son    Coronary artery disease Son 33       CABG   Colon cancer Neg Hx     Social History   Tobacco Use   Smoking status: Never   Smokeless tobacco: Never  Vaping Use   Vaping Use: Never used  Substance Use Topics   Alcohol use: Not Currently    Alcohol/week: 1.0 standard drink    Types: 1 Glasses of wine per week   Drug use: No    Home Medications Prior to Admission medications   Medication Sig Start Date End Date Taking? Authorizing Provider  HYDROcodone-acetaminophen (NORCO/VICODIN) 5-325 MG tablet Take 1 tablet by mouth every 4 (four) hours as needed for up to 5 days for moderate pain. 12/26/20 12/31/20 Yes Fransico Meadow, PA-C  acetaminophen (TYLENOL) 500 MG tablet Take 1 tablet (500 mg total) by mouth every 6 (six) hours as needed. 05/04/20   Ivy Lynn, NP  ALPRAZolam Duanne Moron) 0.5 MG tablet Take 1 tablet (0.5 mg total) by mouth at  bedtime as needed. 10/22/19   Sharion Balloon, FNP  B Complex Vitamins (VITAMIN B COMPLEX PO) Take by mouth.    [provider]  blood glucose meter kit and supplies Dispense based on patient and insurance preference. Use up to four times daily as directed. (FOR ICD-10 E10.9, E11.9). 12/03/19   Sharion Balloon, FNP  busPIRone (BUSPAR) 5 MG tablet TAKE ONE (1) TABLET THREE (3) TIMES EACH DAY 12/16/19   Evelina Dun A, FNP  diclofenac Sodium (VOLTAREN) 1 % GEL Apply 2 g topically 4 (four) times daily. 05/04/20   Ivy Lynn, NP  diclofenac Sodium (VOLTAREN) 1 % GEL Apply 2 g topically 4 (four) times daily. Patient not taking: Reported on 12/15/2020 12/10/20   Couture, Cortni S, PA-C  ELIQUIS 5 MG TABS tablet TAKE ONE TABLET BY MOUTH TWICE DAILY 09/13/20   Minus Breeding, MD  escitalopram (LEXAPRO) 10 MG tablet Take 1 tablet (10 mg total) by mouth daily. 01/10/17   Evelina Dun A, FNP  hydroxychloroquine (PLAQUENIL) 200 MG tablet TAKE ONE (1) TABLET BY MOUTH EVERY DAY 12/12/20   Rice, Resa Miner, MD  isosorbide mononitrate (IMDUR) 60 MG 24 hr tablet TAKE ONE (1) TABLET EACH DAY 11/15/20   Minus Breeding, MD  levothyroxine (SYNTHROID) 50 MCG tablet TAKE ONE (1) TABLET EACH DAY 10/13/20   Sharion Balloon, FNP  linaclotide (LINZESS) 72 MCG capsule Take 1 capsule (72 mcg total) by mouth daily before breakfast. 10/22/19   Evelina Dun A, FNP  lisinopril (ZESTRIL) 10 MG tablet TAKE ONE (1) TABLET EACH DAY 12/01/20   Evelina Dun A, FNP  metFORMIN (GLUCOPHAGE) 1000 MG tablet TAKE ONE TABLET BY MOUTH TWICE A DAY WITH FOOD 11/15/20   Evelina Dun A, FNP  metoprolol tartrate (LOPRESSOR) 50 MG tablet TAKE ONE TABLET BY MOUTH TWICE DAILY 09/13/20   Evelina Dun A, FNP  nitroGLYCERIN (NITROSTAT) 0.4 MG SL tablet Place 1 tablet (0.4 mg total) under the tongue every 5 (five) minutes as needed for chest pain.  08/29/18   Evelina Dun A, FNP  ondansetron (ZOFRAN) 4 MG tablet TAKE 1 TABLET BY MOUTH EVERY 8  HOURS AS NEEDED FOR NAUSEA & VOMITING 11/24/20   Evelina Dun A, FNP  pantoprazole (PROTONIX) 40 MG tablet TAKE ONE TABLET BY MOUTH TWICE DAILY 11/15/20   Evelina Dun A, FNP  pravastatin (PRAVACHOL) 80 MG tablet TAKE 1 TABLET DAILY IN THE EVENING 11/15/20   Evelina Dun A, FNP  tiZANidine (ZANAFLEX) 2 MG tablet TAKE 1 TABLET BY MOUTH AT BEDTIME AS NEEDED 12/12/20   Rice, Resa Miner, MD    Allergies    Fentanyl and Promethazine hcl  Review of Systems   Review of Systems  Constitutional:  Negative for fever.  All other systems reviewed and are negative.  Physical Exam Updated Vital Signs BP 134/73   Pulse 76   Temp 98 F (36.7 C) (Oral)   Resp 16   SpO2 99%   Physical Exam Vitals and nursing note reviewed.  Constitutional:      Appearance: She is well-developed.  HENT:     Head: Normocephalic.  Cardiovascular:     Rate and Rhythm: Normal rate.  Pulmonary:     Effort: Pulmonary effort is normal.  Abdominal:     General: There is no distension.  Musculoskeletal:        General: Tenderness present. No swelling or deformity.     Cervical back: Normal range of motion.     Comments: Pt unable to fully extend of flex.  Nv and ns intact  Neurological:     Mental Status: She is alert and oriented to person, place, and time.    ED Results / Procedures / Treatments   Labs (all labs ordered are listed, but only abnormal results are displayed) Labs Reviewed - No data to display  EKG None  Radiology MR KNEE RIGHT WO CONTRAST  Result Date: 12/26/2020 CLINICAL DATA:  Knee pain, chronic. Patient reports recent popping out of position and instability. No given history of prior right knee surgery. EXAM: MRI OF THE RIGHT KNEE WITHOUT CONTRAST TECHNIQUE: Multiplanar, multisequence MR imaging of the knee was performed. No intravenous contrast was administered. COMPARISON:  Radiographs 12/10/2020. FINDINGS: Despite efforts by the technologist and patient, mild motion artifact is  present on today's exam and could not be eliminated. This reduces exam sensitivity and specificity. MENISCI Medial meniscus:  Intact with normal morphology. Lateral meniscus: There is a large bucket-handle tear of the lateral meniscus with a large centrally displaced meniscal fragment. The posterior horn is anteriorly flipped into the joint, best seen on the sagittal images. LIGAMENTS Cruciates:  Intact. Collaterals:  Intact. CARTILAGE Patellofemoral: Mild patellofemoral chondral thinning and osteophyte formation. Medial: Mild chondral thinning, surface irregularity and osteophyte formation. Lateral: Moderate chondral thinning, surface irregularity and osteophyte formation. MISCELLANEOUS Joint:  No significant joint effusion. Popliteal Fossa:  Small Baker's cyst. Extensor Mechanism:  Intact. Bones:  No acute or significant extra-articular osseous findings. Other: No other significant periarticular soft tissue findings. IMPRESSION: 1. Large bucket-handle tear of the lateral meniscus with a large centrally displaced meniscal fragment. 2. The medial meniscus, cruciate and collateral ligaments are intact. 3. Mild tricompartmental degenerative changes, greatest in the lateral compartment. No acute osseous findings. Electronically Signed   By: Richardean Sale M.D.   On: 12/26/2020 16:09    Procedures Procedures   Medications Ordered in ED Medications - No data to display  ED Course  I have reviewed the triage vital signs and the nursing notes.  Pertinent labs & imaging results that were available during my care of the patient were reviewed by me and considered in my medical decision making (see chart for details).    MDM Rules/Calculators/A&P                           MDM:  MRI shows large bucket handle tear of meniscus.  I reviewed MRi results and counseled pt.  Pt placed in a knee immobilizer.  Pt advise to follow up with her Orthopaedist for evaluation   Final Clinical Impression(s) / ED  Diagnoses Final diagnoses:  Bucket handle tear of lateral meniscus of right knee, unspecified whether old or current tear, initial encounter    Rx / DC Orders ED Discharge Orders          Ordered    HYDROcodone-acetaminophen (NORCO/VICODIN) 5-325 MG tablet  Every 4 hours PRN        12/26/20 1811          An After Visit Summary was printed and given to the patient.    Sidney Ace 12/27/20 1346    Luna Fuse, MD 01/11/21 [phone]

## 2020-12-28 ENCOUNTER — Ambulatory Visit: Payer: Medicare Other | Admitting: Internal Medicine

## 2020-12-29 ENCOUNTER — Encounter: Payer: Self-pay | Admitting: Orthopaedic Surgery

## 2020-12-29 ENCOUNTER — Ambulatory Visit (INDEPENDENT_AMBULATORY_CARE_PROVIDER_SITE_OTHER): Payer: Medicare Other | Admitting: Orthopaedic Surgery

## 2020-12-29 ENCOUNTER — Other Ambulatory Visit: Payer: Self-pay

## 2020-12-29 DIAGNOSIS — S83281A Other tear of lateral meniscus, current injury, right knee, initial encounter: Secondary | ICD-10-CM

## 2021-01-01 DIAGNOSIS — S83289A Other tear of lateral meniscus, current injury, unspecified knee, initial encounter: Secondary | ICD-10-CM | POA: Insufficient documentation

## 2021-01-01 NOTE — Progress Notes (Signed)
Office Visit Note   Patient: Rhonda Garcia           Date of Birth: [birth year]           MRN: [id number] Visit Date: 12/29/2020              Requested by: Sharion Balloon, [address] [place],  [place] [address] PCP: Sharion Balloon, FNP   Assessment & Plan: Visit Diagnoses:  1. Acute lateral meniscus tear of right knee, initial encounter     Plan: Patient has bucket-handle meniscal tear lateral meniscus with repetitive locking.  She will need cardiology clearance and then outpatient arthroscopy for resection of the degenerative lateral meniscus.  Procedure discussed questions elicited and answered she understands request to proceed.  Follow-Up Instructions: No follow-ups on file.   Orders:  No orders of the defined types were placed in this encounter.  No orders of the defined types were placed in this encounter.     Procedures: No procedures performed   Clinical Data: No additional findings.   Subjective: Chief Complaint  Patient presents with   Right Knee - Pain    HPI 80 year old female returns post MRI scan with right knee repetitive locking.  Increased symptoms for the last 2 weeks it tends to lock on the knee in extension.  She was in an immobilizer initially.  She had an injection earlier in the month.  She has used hydrocodone as well as Voltaren but continues to have locking of her knee.  MRI scan shows large bucket-handle tear of the lateral meniscus subluxed into the joint centrally displaced meniscus fragment.  Medial meniscus cruciate ligaments are intact.  She does have some tricompartmental degenerative changes most severe in the lateral compartment.  Original fall was 3 months ago.  Review of Systems positive for diabetes.  Past history of paroxysmal atrial fib.  Non-STEMI.  All other systems are noncontributory to HPI.   Objective: Vital Signs: Ht 5\' 1"  (1.549 m)   Wt 176 lb (79.8 kg)   BMI 33.25 kg/m   Physical Exam Constitutional:       Appearance: She is well-developed.  HENT:     Head: Normocephalic.     Right Ear: External ear normal.     Left Ear: External ear normal. There is no impacted cerumen.  Eyes:     Pupils: Pupils are equal, round, and reactive to light.  Neck:     Thyroid: No thyromegaly.     Trachea: No tracheal deviation.  Cardiovascular:     Rate and Rhythm: Normal rate.  Pulmonary:     Effort: Pulmonary effort is normal.  Abdominal:     Palpations: Abdomen is soft.  Musculoskeletal:     Cervical back: No rigidity.  Skin:    General: Skin is warm and dry.  Neurological:     Mental Status: She is alert and oriented to person, place, and time.  Psychiatric:        Behavior: Behavior normal.    Ortho Exam patient has tenderness to the right knee lateral joint line.  Negative logroll of the hips.  Distal pulses are intact.  Specialty Comments:  No specialty comments available.  Imaging: CLINICAL DATA:  Knee pain, chronic. Patient reports recent popping out of position and instability. No given history of prior right knee surgery.   EXAM: MRI OF THE RIGHT KNEE WITHOUT CONTRAST   TECHNIQUE: Multiplanar, multisequence MR imaging of the knee was performed. No intravenous contrast  was administered.   COMPARISON:  Radiographs 12/10/2020.   FINDINGS: Despite efforts by the technologist and patient, mild motion artifact is present on today's exam and could not be eliminated. This reduces exam sensitivity and specificity.   MENISCI   Medial meniscus:  Intact with normal morphology.   Lateral meniscus: There is a large bucket-handle tear of the lateral meniscus with a large centrally displaced meniscal fragment. The posterior horn is anteriorly flipped into the joint, best seen on the sagittal images.   LIGAMENTS   Cruciates:  Intact.   Collaterals:  Intact.   CARTILAGE   Patellofemoral: Mild patellofemoral chondral thinning and osteophyte formation.   Medial: Mild  chondral thinning, surface irregularity and osteophyte formation.   Lateral: Moderate chondral thinning, surface irregularity and osteophyte formation.   MISCELLANEOUS   Joint:  No significant joint effusion.   Popliteal Fossa:  Small Baker's cyst.   Extensor Mechanism:  Intact.   Bones:  No acute or significant extra-articular osseous findings.   Other: No other significant periarticular soft tissue findings.   IMPRESSION: 1. Large bucket-handle tear of the lateral meniscus with a large centrally displaced meniscal fragment. 2. The medial meniscus, cruciate and collateral ligaments are intact. 3. Mild tricompartmental degenerative changes, greatest in the lateral compartment. No acute osseous findings.     Electronically Signed   By: Richardean Sale M.D.   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   Rheumatoid factor positive 10/19/2020   Generalized osteoarthritis of multiple sites 10/19/2020   Pain in left shoulder 10/19/2020   Erosive osteoarthritis of both hands 10/19/2020   Back pain with sciatica 05/04/2020   Bilateral primary osteoarthritis of knee 01/28/2020   Unintentional weight loss 05/05/2019   Abdominal pain 01/01/2019   Chest pain, rule out acute myocardial infarction 12/19/2018   Diabetes mellitus ([place]) 12/19/2018   History of atrial fibrillation 12/19/2018   Hx of non-ST elevation myocardial infarction (NSTEMI) 12/19/2018   Anemia 07/22/2018   Shortness of breath 01/09/2017   Obese 11/05/2016   Vertigo 06/27/2016   GAD (generalized anxiety disorder) 10/27/2015   Coronary artery disease involving native coronary artery of native heart with unstable angina pectoris ([place])    Hx of adenomatous colonic polyps 06/22/2015   Hypothyroidism 01/17/2015   Vitamin D deficiency 09/09/2014   Hiatal hernia    Dysphagia,  pharyngoesophageal phase    History of colonic polyps    Diverticulosis of colon without hemorrhage    Paroxysmal atrial fibrillation ([place]) 11/02/2013   Depression 10/28/2012   Type 2 diabetes mellitus with hyperglycemia ([place]) 10/28/2012   Lipoma of back 02/12/2012   Constipation 02/28/2011   Esophageal dysphagia 02/28/2011   Gastroesophageal reflux 02/28/2011   POSITIONAL VERTIGO 04/29/2009   Osteoarthritis 12/23/2008   Hyperlipidemia associated with type 2 diabetes mellitus ([place]) 12/21/2008   Hypertension associated with diabetes ([place]) 12/21/2008   Past Medical History:  Diagnosis Date   A-fib ([place])    Anal fissure    resolved    Anemia    Back pain    with left radiculopathy   CAD (coronary artery disease)    Cataract    Collagen vascular disease ([place])    DDD (degenerative disc disease)    Depression    Diabetes mellitus    Diverticulosis    Dyslipidemia    Dysrhythmia    AFib  Esophagitis    GERD (gastroesophageal reflux disease)    Hiatal hernia    Hx of peptic ulcer    Hyperlipidemia    Hypertension    Hypothyroidism    Internal hemorrhoids 2017   NSTEMI (non-ST elevated myocardial infarction) ([place]) 08/05/2015   Osteopenia    Sleep apnea    not using CPAP now, could not tolerate and PCP aware.   Thyroid disease     Family History  Problem Relation Age of Onset   Heart attack Mother    Hypertension Mother    Stroke Mother        Deceased, age 39   Diabetes Sister    Arthritis Sister    Diabetes Sister    Cancer Sister        ovarian   Stroke Sister    Alzheimer's disease Sister    Diabetes Brother        also has a blood disorder    Clotting disorder Brother    Kidney disease Brother    Heart disease Brother    Melanoma Brother        deceased   Diabetes Son    Hypertension Son    Coronary artery disease Son 49       CABG   Stroke Son    Hypertension Son    Coronary artery disease Son 35       CABG   Colon cancer Neg Hx     Past  Surgical History:  Procedure Laterality Date   ABDOMINAL HYSTERECTOMY Bilateral 2001 - approximate   APPENDECTOMY     BREAST REDUCTION SURGERY     CARDIAC CATHETERIZATION N/A 08/08/2015   Procedure: Left Heart Cath and Coronary Angiography;  Surgeon: Burnell Blanks, MD;  Location: [facility];  Service: Cardiovascular;  Laterality: N/A;   CHOLECYSTECTOMY  2008 - approximate   COLONOSCOPY  06/2007   Friable anal canal and anal papillae. Pancolonic diverticula. Normal terminal ileum. Status post segmental biopsy, descending colon biopsy showed minimal cryptitis. Biopsies felt to be  nonspecific but could be seen with NSAIDs. No features of inflammatory bowel disease. Stool studies were negative.   COLONOSCOPY  03/21/2011   RMR: colonic polyps treated as described above, colonic diverticulosis (Tubular adenomas)   COLONOSCOPY WITH PROPOFOL N/A 06/17/2014   RMR: Colonic diverticulosis. Multiple colonic polyps removed as described above.    COLONOSCOPY WITH PROPOFOL N/A 03/19/2016   Procedure: COLONOSCOPY WITH PROPOFOL;  Surgeon: Daneil Dolin, MD;  [facility];  Service: Endoscopy;  Laterality: N/A;  8:45am   COLONOSCOPY WITH PROPOFOL N/A 10/02/2018   Procedure: COLONOSCOPY WITH PROPOFOL;  Surgeon: Daneil Dolin, MD;  [facility];  Service: Endoscopy;  Laterality: N/A;  9:15am   CORONARY ANGIOPLASTY WITH STENT PLACEMENT     4 stents   ESOPHAGEAL DILATION N/A 06/17/2014   Procedure: ESOPHAGEAL DILATION;  Surgeon: Daneil Dolin, MD;  [facility];  Service: Endoscopy;  Laterality: N/A;  Maloney 59 Fr   ESOPHAGOGASTRODUODENOSCOPY  06/2007   Small hiatal hernia   ESOPHAGOGASTRODUODENOSCOPY  03/21/2011   RMR: normal esophagus- status post passage of Maloney dilator. Small hiatal hernia. Trivial antral and bulbar erosions   ESOPHAGOGASTRODUODENOSCOPY (EGD) WITH PROPOFOL N/A 06/17/2014   RMR: Small hiatal hernia; otherwise normal EGD. Status post passage of a  Maloney dilator. No explanation for patients right upper quadrant abdominal pain.    ESOPHAGOGASTRODUODENOSCOPY (EGD) WITH PROPOFOL N/A 03/19/2016   Procedure: ESOPHAGOGASTRODUODENOSCOPY (EGD) WITH PROPOFOL;  Surgeon: Daneil Dolin, MD;  [facility];  Service: Endoscopy;  Laterality: N/A;   KNEE SURGERY Left    arthroscopy   LIPOMA EXCISION  03/17/2012   Procedure: EXCISION LIPOMA;  Surgeon: Gwenyth Ober, MD;  Location: [facility];  Service: General;  Laterality: Right;  excision of right lower back lipoma   MALONEY DILATION N/A 03/19/2016   Procedure: Venia Minks DILATION;  Surgeon: Daneil Dolin, MD;  [facility];  Service: Endoscopy;  Laterality: N/A;   POLYPECTOMY  06/17/2014   Procedure: POLYPECTOMY;  Surgeon: Daneil Dolin, MD;  [facility];  Service: Endoscopy;;   POLYPECTOMY  10/02/2018   Procedure: POLYPECTOMY;  Surgeon: Daneil Dolin, MD;  [facility];  Service: Endoscopy;;  colon   REDUCTION MAMMAPLASTY Bilateral    Social History   Occupational History   Occupation: Retired    Comment: Charity fundraiser  Tobacco Use   Smoking status: Never   Smokeless tobacco: Never  Vaping Use   Vaping Use: Never used  Substance and Sexual Activity   Alcohol use: Not Currently    Alcohol/week: 1.0 standard drink    Types: 1 Glasses of wine per week   Drug use: No   Sexual activity: Not Currently    Birth control/protection: Post-menopausal

## 2021-01-02 ENCOUNTER — Telehealth: Payer: Self-pay | Admitting: Orthopaedic Surgery

## 2021-01-02 ENCOUNTER — Telehealth: Payer: Self-pay

## 2021-01-02 NOTE — Telephone Encounter (Signed)
Covering preop today. Will route to pharm then patient will need call.

## 2021-01-02 NOTE — Telephone Encounter (Signed)
   [facility] Pre-operative Risk Assessment    Patient Name: Rhonda Garcia  DOB: 1940-07-12 MRN: [id number]   Request for surgical clearance:  What type of surgery is being performed? RIGHT KNEE ARTHROSCOPY  When is this surgery scheduled? TBD  What type of clearance is required (medical clearance vs. Pharmacy clearance to hold med vs. Both)? BOTH  Are there any medications that need to be held prior to surgery and how long? [place] name and name of physician performing surgery? [facility], MD ATTN:SHERRIE  What is the office phone number? [phone]   7.   What is the office fax number? [phone]  8.   Anesthesia type (None, local, MAC, general) ? GENERAL

## 2021-01-02 NOTE — Telephone Encounter (Signed)
Patient called. Says she is ready to have surgery. Her call back number is [phone]

## 2021-01-02 NOTE — Telephone Encounter (Signed)
Sending to you as I think you have blue sheet. Please let me know if you need anything from me. Thanks.

## 2021-01-03 NOTE — Telephone Encounter (Signed)
    Patient Name: Rhonda Garcia  DOB: [birth year] MRN: [id number]  Primary Cardiologist: Minus Breeding, MD  Chart reviewed as part of pre-operative protocol coverage. Given past medical history and time since last visit, based on ACC/AHA guidelines, [name] would be at acceptable risk for the planned procedure without further cardiovascular testing. She was last seen by Dr. Percival Spanish 07/2020 and was having some mild dyspnea. She underwent a nuclear stress test that showed no ischemia or infarct and was considered low risk.   Patient with diagnosis of atrial fibrillation on Eliquis for anticoagulation.     Procedure: right knee arthroscopy Date of procedure: TBD     CHA2DS2-VASc Score = 6   This indicates a 9.7% annual risk of stroke. The patient's score is based upon: CHF History: 0 HTN History: 1 Diabetes History: 1 Stroke History: 0 Vascular Disease History: 1 Age Score: 2 Gender Score: 1    CrCl 46 (with adjusted body weight) Platelet count 123   Per office protocol, patient can hold Eliquis for 3 days prior to procedure.  Patient will not need bridging with Lovenox (enoxaparin) around procedure.   Patient should restart Eliquis on the evening of procedure or day after, at discretion of procedure MD  For orthopedic procedures please be sure to resume therapeutic (not prophylactic) dosing.  The patient was advised that if she develops new symptoms prior to surgery to contact our office to arrange for a follow-up visit, and she verbalized understanding.  I will route this recommendation to the requesting party via Epic fax function and remove from pre-op pool.  Please call with questions.  Kathyrn Drown, NP 01/03/2021, 3:41 PM

## 2021-01-03 NOTE — Telephone Encounter (Signed)
Patient with diagnosis of atrial fibrillation on Eliquis for anticoagulation.    Procedure: right knee arthroscopy Date of procedure: TBD   CHA2DS2-VASc Score = 6   This indicates a 9.7% annual risk of stroke. The patient's score is based upon: CHF History: 0 HTN History: 1 Diabetes History: 1 Stroke History: 0 Vascular Disease History: 1 Age Score: 2 Gender Score: 1    CrCl 46 (with adjusted body weight) Platelet count 123  Per office protocol, patient can hold Eliquis for 3 days prior to procedure.   Patient will not need bridging with Lovenox (enoxaparin) around procedure.  Patient should restart Eliquis on the evening of procedure or day after, at discretion of procedure MD  For orthopedic procedures please be sure to resume therapeutic (not prophylactic) dosing.

## 2021-01-03 NOTE — Telephone Encounter (Signed)
Per my call to [facility], clearance still pending.

## 2021-01-03 NOTE — Telephone Encounter (Signed)
I called patient and discussed scheduling.  I advised I would call her back once I confirm that she is cleared by the cardiologist.

## 2021-01-03 NOTE — Telephone Encounter (Signed)
Patient cleared.  I called patient and scheduled surgery for 01-09-21.

## 2021-01-05 ENCOUNTER — Ambulatory Visit: Payer: Medicare Other | Admitting: Internal Medicine

## 2021-01-09 ENCOUNTER — Other Ambulatory Visit: Payer: Self-pay | Admitting: Orthopaedic Surgery

## 2021-01-09 DIAGNOSIS — S83251A Bucket-handle tear of lateral meniscus, current injury, right knee, initial encounter: Secondary | ICD-10-CM

## 2021-01-09 DIAGNOSIS — Y999 Unspecified external cause status: Secondary | ICD-10-CM | POA: Diagnosis not present

## 2021-01-09 DIAGNOSIS — S83211A Bucket-handle tear of medial meniscus, current injury, right knee, initial encounter: Secondary | ICD-10-CM | POA: Diagnosis not present

## 2021-01-09 DIAGNOSIS — X58XXXA Exposure to other specified factors, initial encounter: Secondary | ICD-10-CM | POA: Diagnosis not present

## 2021-01-09 DIAGNOSIS — M94261 Chondromalacia, right knee: Secondary | ICD-10-CM | POA: Diagnosis not present

## 2021-01-09 MED ORDER — HYDROCODONE-ACETAMINOPHEN 5-325 MG PO TABS: 1.0000 | ORAL_TABLET | Freq: Four times a day (QID) | ORAL | 0 refills | Status: DC | PRN

## 2021-01-09 NOTE — Progress Notes (Unsigned)
Norco for post op pain Rx sent.

## 2021-01-19 ENCOUNTER — Ambulatory Visit (INDEPENDENT_AMBULATORY_CARE_PROVIDER_SITE_OTHER): Payer: Medicare Other | Admitting: Orthopaedic Surgery

## 2021-01-19 ENCOUNTER — Other Ambulatory Visit: Payer: Self-pay

## 2021-01-19 ENCOUNTER — Encounter: Payer: Self-pay | Admitting: Orthopaedic Surgery

## 2021-01-19 VITALS — Ht 61.0 in | Wt 176.0 lb

## 2021-01-19 DIAGNOSIS — S83281A Other tear of lateral meniscus, current injury, right knee, initial encounter: Secondary | ICD-10-CM

## 2021-01-19 NOTE — Progress Notes (Signed)
Post-Op Visit Note   Patient: Rhonda Garcia           Date of Birth: 03/03/1941           MRN: [id number] Visit Date: 01/19/2021 PCP: Sharion Balloon, FNP   Assessment & Plan: Post excision lateral meniscus bucket-handle tear which was subluxed in the notch with her locking.  Chief Complaint:  Chief Complaint  Patient presents with   Right Knee - Routine Post Op    01/09/2021 right knee scope, PLM   Visit Diagnoses:  1. Acute lateral meniscus tear of right knee, initial encounter     Plan: Return 4 weeks.  Arthroscopic portals look good.  No locking she can gradually wean off the walker.  Follow-Up Instructions: Return in about 4 weeks (around 02/16/2021).   Orders:  No orders of the defined types were placed in this encounter.  No orders of the defined types were placed in this encounter.   Imaging: No results found.  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   Rheumatoid factor positive 10/19/2020   Generalized osteoarthritis of multiple sites 10/19/2020   Pain in left shoulder 10/19/2020   Erosive osteoarthritis of both hands 10/19/2020   Back pain with sciatica 05/04/2020   Bilateral primary osteoarthritis of knee 01/28/2020   Unintentional weight loss 05/05/2019   Abdominal pain 01/01/2019   Chest pain, rule out acute myocardial infarction 12/19/2018   Diabetes mellitus ([place]) 12/19/2018   History of atrial fibrillation 12/19/2018   Hx of non-ST elevation myocardial infarction (NSTEMI) 12/19/2018   Anemia 07/22/2018   Shortness of breath 01/09/2017   Obese 11/05/2016   Vertigo 06/27/2016   GAD (generalized anxiety disorder) 10/27/2015   Coronary artery disease involving native coronary artery of native heart with unstable angina pectoris ([place])    Hx of adenomatous colonic polyps 06/22/2015   Hypothyroidism 01/17/2015   Vitamin D deficiency  09/09/2014   Hiatal hernia    Dysphagia, pharyngoesophageal phase    History of colonic polyps    Diverticulosis of colon without hemorrhage    Paroxysmal atrial fibrillation ([place]) 11/02/2013   Depression 10/28/2012   Type 2 diabetes mellitus with hyperglycemia ([place]) 10/28/2012   Lipoma of back 02/12/2012   Constipation 02/28/2011   Esophageal dysphagia 02/28/2011   Gastroesophageal reflux 02/28/2011   POSITIONAL VERTIGO 04/29/2009   Osteoarthritis 12/23/2008   Hyperlipidemia associated with type 2 diabetes mellitus ([place]) 12/21/2008   Hypertension associated with diabetes ([place]) 12/21/2008   Past Medical History:  Diagnosis Date   A-fib ([place])    Anal fissure    resolved    Anemia    Back pain    with left radiculopathy   CAD (coronary artery disease)    Cataract    Collagen vascular disease ([place])    DDD (degenerative disc disease)    Depression    Diabetes mellitus    Diverticulosis    Dyslipidemia    Dysrhythmia    AFib   Esophagitis    GERD (gastroesophageal reflux disease)    Hiatal hernia    Hx of peptic ulcer    Hyperlipidemia    Hypertension    Hypothyroidism    Internal hemorrhoids 2017   NSTEMI (non-ST elevated myocardial infarction) ([place]) 08/05/2015   Osteopenia    Sleep apnea    not using CPAP now, could not tolerate and PCP aware.  Thyroid disease     Family History  Problem Relation Age of Onset   Heart attack Mother    Hypertension Mother    Stroke Mother        Deceased, age 49   Diabetes Sister    Arthritis Sister    Diabetes Sister    Cancer Sister        ovarian   Stroke Sister    Alzheimer's disease Sister    Diabetes Brother        also has a blood disorder    Clotting disorder Brother    Kidney disease Brother    Heart disease Brother    Melanoma Brother        deceased   Diabetes Son    Hypertension Son    Coronary artery disease Son 65       CABG   Stroke Son    Hypertension Son    Coronary artery disease Son 84        CABG   Colon cancer Neg Hx     Past Surgical History:  Procedure Laterality Date   ABDOMINAL HYSTERECTOMY Bilateral 2001 - approximate   APPENDECTOMY     BREAST REDUCTION SURGERY     CARDIAC CATHETERIZATION N/A 08/08/2015   Procedure: Left Heart Cath and Coronary Angiography;  Surgeon: Burnell Blanks, MD;  Location: [facility];  Service: Cardiovascular;  Laterality: N/A;   CHOLECYSTECTOMY  2008 - approximate   COLONOSCOPY  06/2007   Friable anal canal and anal papillae. Pancolonic diverticula. Normal terminal ileum. Status post segmental biopsy, descending colon biopsy showed minimal cryptitis. Biopsies felt to be  nonspecific but could be seen with NSAIDs. No features of inflammatory bowel disease. Stool studies were negative.   COLONOSCOPY  03/21/2011   RMR: colonic polyps treated as described above, colonic diverticulosis (Tubular adenomas)   COLONOSCOPY WITH PROPOFOL N/A 06/17/2014   RMR: Colonic diverticulosis. Multiple colonic polyps removed as described above.    COLONOSCOPY WITH PROPOFOL N/A 03/19/2016   Procedure: COLONOSCOPY WITH PROPOFOL;  Surgeon: Daneil Dolin, MD;  [facility];  Service: Endoscopy;  Laterality: N/A;  8:45am   COLONOSCOPY WITH PROPOFOL N/A 10/02/2018   Procedure: COLONOSCOPY WITH PROPOFOL;  Surgeon: Daneil Dolin, MD;  [facility];  Service: Endoscopy;  Laterality: N/A;  9:15am   CORONARY ANGIOPLASTY WITH STENT PLACEMENT     4 stents   ESOPHAGEAL DILATION N/A 06/17/2014   Procedure: ESOPHAGEAL DILATION;  Surgeon: Daneil Dolin, MD;  [facility];  Service: Endoscopy;  Laterality: N/A;  Maloney 24 Fr   ESOPHAGOGASTRODUODENOSCOPY  06/2007   Small hiatal hernia   ESOPHAGOGASTRODUODENOSCOPY  03/21/2011   RMR: normal esophagus- status post passage of Maloney dilator. Small hiatal hernia. Trivial antral and bulbar erosions   ESOPHAGOGASTRODUODENOSCOPY (EGD) WITH PROPOFOL N/A 06/17/2014   RMR: Small hiatal hernia; otherwise  normal EGD. Status post passage of a Maloney dilator. No explanation for patients right upper quadrant abdominal pain.    ESOPHAGOGASTRODUODENOSCOPY (EGD) WITH PROPOFOL N/A 03/19/2016   Procedure: ESOPHAGOGASTRODUODENOSCOPY (EGD) WITH PROPOFOL;  Surgeon: Daneil Dolin, MD;  [facility];  Service: Endoscopy;  Laterality: N/A;   KNEE SURGERY Left    arthroscopy   LIPOMA EXCISION  03/17/2012   Procedure: EXCISION LIPOMA;  Surgeon: Gwenyth Ober, MD;  Location: [facility];  Service: General;  Laterality: Right;  excision of right lower back lipoma   MALONEY DILATION N/A 03/19/2016  Procedure: MALONEY DILATION;  Surgeon: Daneil Dolin, MD;  [facility];  Service: Endoscopy;  Laterality: N/A;   POLYPECTOMY  06/17/2014   Procedure: POLYPECTOMY;  Surgeon: Daneil Dolin, MD;  [facility];  Service: Endoscopy;;   POLYPECTOMY  10/02/2018   Procedure: POLYPECTOMY;  Surgeon: Daneil Dolin, MD;  [facility];  Service: Endoscopy;;  colon   REDUCTION MAMMAPLASTY Bilateral    Social History   Occupational History   Occupation: Retired    Comment: Charity fundraiser  Tobacco Use   Smoking status: Never   Smokeless tobacco: Never  Vaping Use   Vaping Use: Never used  Substance and Sexual Activity   Alcohol use: Not Currently    Alcohol/week: 1.0 standard drink    Types: 1 Glasses of wine per week   Drug use: No   Sexual activity: Not Currently    Birth control/protection: Post-menopausal

## 2021-02-10 ENCOUNTER — Other Ambulatory Visit: Payer: Self-pay | Admitting: Family

## 2021-02-10 DIAGNOSIS — M5430 Sciatica, unspecified side: Secondary | ICD-10-CM

## 2021-02-10 DIAGNOSIS — M154 Erosive (osteo)arthritis: Secondary | ICD-10-CM

## 2021-02-10 DIAGNOSIS — E1169 Type 2 diabetes mellitus with other specified complication: Secondary | ICD-10-CM

## 2021-02-10 DIAGNOSIS — E1159 Type 2 diabetes mellitus with other circulatory complications: Secondary | ICD-10-CM

## 2021-02-10 DIAGNOSIS — I152 Hypertension secondary to endocrine disorders: Secondary | ICD-10-CM

## 2021-02-10 DIAGNOSIS — E785 Hyperlipidemia, unspecified: Secondary | ICD-10-CM

## 2021-02-10 DIAGNOSIS — K219 Gastro-esophageal reflux disease without esophagitis: Secondary | ICD-10-CM

## 2021-02-16 ENCOUNTER — Other Ambulatory Visit: Payer: Self-pay

## 2021-02-16 ENCOUNTER — Ambulatory Visit (INDEPENDENT_AMBULATORY_CARE_PROVIDER_SITE_OTHER): Payer: Medicare Other | Admitting: Orthopaedic Surgery

## 2021-02-16 ENCOUNTER — Encounter: Payer: Self-pay | Admitting: Orthopaedic Surgery

## 2021-02-16 VITALS — Ht 61.0 in | Wt 173.0 lb

## 2021-02-16 DIAGNOSIS — M25561 Pain in right knee: Secondary | ICD-10-CM

## 2021-02-16 DIAGNOSIS — S83281S Other tear of lateral meniscus, current injury, right knee, sequela: Secondary | ICD-10-CM

## 2021-02-16 NOTE — Progress Notes (Signed)
Postop right knee arthroscopy 01/09/2021 for bucket-handle lateral meniscal tear subluxed in the joint.  She is having some soreness still with limping.  She can use the Voltaren gel she has at home and apply it 4 times a day instead of just at night.  She could use that or Aspercreme.  Knee is no longer locking she is gradually walking better each week.  Follow-up if she has persistent symptoms.  Operative findings again was discussed.

## 2021-02-22 ENCOUNTER — Other Ambulatory Visit: Payer: Self-pay

## 2021-02-22 ENCOUNTER — Encounter: Payer: Self-pay | Admitting: Family

## 2021-02-22 ENCOUNTER — Ambulatory Visit (INDEPENDENT_AMBULATORY_CARE_PROVIDER_SITE_OTHER): Payer: Medicare Other | Admitting: Family

## 2021-02-22 VITALS — BP 159/80 | HR 67 | Temp 96.0°F | Ht 61.0 in | Wt 160.0 lb

## 2021-02-22 DIAGNOSIS — Z1159 Encounter for screening for other viral diseases: Secondary | ICD-10-CM

## 2021-02-22 DIAGNOSIS — M159 Polyosteoarthritis, unspecified: Secondary | ICD-10-CM

## 2021-02-22 DIAGNOSIS — Z6830 Body mass index (BMI) 30.0-30.9, adult: Secondary | ICD-10-CM

## 2021-02-22 DIAGNOSIS — Z23 Encounter for immunization: Secondary | ICD-10-CM | POA: Diagnosis not present

## 2021-02-22 DIAGNOSIS — I25110 Atherosclerotic heart disease of native coronary artery with unstable angina pectoris: Secondary | ICD-10-CM | POA: Diagnosis not present

## 2021-02-22 DIAGNOSIS — E1159 Type 2 diabetes mellitus with other circulatory complications: Secondary | ICD-10-CM

## 2021-02-22 DIAGNOSIS — E039 Hypothyroidism, unspecified: Secondary | ICD-10-CM

## 2021-02-22 DIAGNOSIS — E785 Hyperlipidemia, unspecified: Secondary | ICD-10-CM

## 2021-02-22 DIAGNOSIS — I152 Hypertension secondary to endocrine disorders: Secondary | ICD-10-CM

## 2021-02-22 DIAGNOSIS — K219 Gastro-esophageal reflux disease without esophagitis: Secondary | ICD-10-CM

## 2021-02-22 DIAGNOSIS — F331 Major depressive disorder, recurrent, moderate: Secondary | ICD-10-CM

## 2021-02-22 DIAGNOSIS — M1990 Unspecified osteoarthritis, unspecified site: Secondary | ICD-10-CM | POA: Diagnosis not present

## 2021-02-22 DIAGNOSIS — E1169 Type 2 diabetes mellitus with other specified complication: Secondary | ICD-10-CM

## 2021-02-22 DIAGNOSIS — F411 Generalized anxiety disorder: Secondary | ICD-10-CM

## 2021-02-22 DIAGNOSIS — E1165 Type 2 diabetes mellitus with hyperglycemia: Secondary | ICD-10-CM | POA: Diagnosis not present

## 2021-02-22 DIAGNOSIS — E559 Vitamin D deficiency, unspecified: Secondary | ICD-10-CM

## 2021-02-22 DIAGNOSIS — E6609 Other obesity due to excess calories: Secondary | ICD-10-CM

## 2021-02-22 LAB — BAYER DCA HB A1C WAIVED: HB A1C (BAYER DCA - WAIVED): 7 % — ABNORMAL HIGH (ref 4.8–5.6)

## 2021-02-22 NOTE — Progress Notes (Signed)
Subjective:    Patient ID: Rhonda Garcia, female    DOB: 1940/09/17, 80 y.o.   MRN: [id number]  Chief Complaint  Patient presents with   Medical Management of Chronic Issues   PT presents to the office today for chronic follow up. She is followed by Cardiologists every 6 months for A Fib and CAD Stress test on 08/10/20. She is followed by GI as needed for constipation, GERD.   She had elevated rheumatoid arthritis and saw a rheumatologists. She states her pain is a 9 out 10.  Hypertension This is a chronic problem. The current episode started more than 1 year ago. The problem has been resolved since onset. The problem is controlled. Associated symptoms include anxiety and malaise/fatigue. Pertinent negatives include no blurred vision, peripheral edema or shortness of breath. Risk factors for coronary artery disease include dyslipidemia, diabetes mellitus, obesity and sedentary lifestyle. The current treatment provides moderate improvement. Hypertensive end-organ damage includes CAD/MI. There is no history of heart failure. Identifiable causes of hypertension include a thyroid problem.  Gastroesophageal Reflux She complains of belching and heartburn. This is a chronic problem. The current episode started more than 1 year ago. The problem occurs occasionally. Associated symptoms include fatigue. Risk factors include obesity. She has tried a PPI for the symptoms.  Diabetes She presents for her follow-up diabetic visit. She has type 2 diabetes mellitus. Hypoglycemia symptoms include nervousness/anxiousness. Associated symptoms include fatigue and foot paresthesias. Pertinent negatives for diabetes include no blurred vision. Symptoms are stable. Diabetic complications include heart disease and peripheral neuropathy. Risk factors for coronary artery disease include dyslipidemia, diabetes mellitus, hypertension, sedentary lifestyle and post-menopausal. She is following a generally unhealthy diet. (Does  not check at home) Eye exam is not current.  Thyroid Problem Presents for follow-up visit. Symptoms include anxiety, constipation and fatigue. Patient reports no depressed mood or dry skin. The symptoms have been stable. Her past medical history is significant for hyperlipidemia. There is no history of heart failure.  Hyperlipidemia This is a chronic problem. The current episode started more than 1 year ago. Exacerbating diseases include obesity. Pertinent negatives include no shortness of breath. Current antihyperlipidemic treatment includes statins. The current treatment provides moderate improvement of lipids. Risk factors for coronary artery disease include diabetes mellitus, a sedentary lifestyle, hypertension, post-menopausal, dyslipidemia and obesity.  Arthritis Presents for follow-up visit. She complains of pain and stiffness. The symptoms have been stable. Affected locations include the left knee, right knee, left MCP, right MCP, right shoulder and left shoulder. Her pain is at a severity of 9/10. Associated symptoms include fatigue.  Anxiety Presents for follow-up visit. Symptoms include nervous/anxious behavior. Patient reports no depressed mood, excessive worry, irritability, muscle tension, restlessness or shortness of breath. Symptoms occur most days. The severity of symptoms is moderate.   Her past medical history is significant for anemia.  Depression        This is a chronic problem.  The current episode started more than 1 year ago.   Associated symptoms include fatigue.  Associated symptoms include no helplessness, no hopelessness, not irritable and no restlessness.  Past treatments include SSRIs - Selective serotonin reuptake inhibitors.  Past medical history includes thyroid problem and anxiety.   Constipation This is a chronic problem. The current episode started more than 1 year ago. The problem has been resolved since onset. Her stool frequency is 2 to 3 times per week. She has  tried diet changes for the symptoms. The treatment provided moderate relief.  Anemia Presents for follow-up visit. Symptoms include malaise/fatigue. There is no history of heart failure.     Review of Systems  Constitutional:  Positive for fatigue and malaise/fatigue. Negative for irritability.  Eyes:  Negative for blurred vision.  Respiratory:  Negative for shortness of breath.   Gastrointestinal:  Positive for constipation and heartburn.  Musculoskeletal:  Positive for arthritis and stiffness.  Psychiatric/Behavioral:  Positive for depression. The patient is nervous/anxious.   All other systems reviewed and are negative.     Objective:   Physical Exam Vitals reviewed.  Constitutional:      General: She is not irritable.She is not in acute distress.    Appearance: She is well-developed. She is obese.  HENT:     Head: Normocephalic and atraumatic.     Right Ear: Tympanic membrane normal.     Left Ear: Tympanic membrane normal.  Eyes:     Pupils: Pupils are equal, round, and reactive to light.  Neck:     Thyroid: No thyromegaly.  Cardiovascular:     Rate and Rhythm: Normal rate and regular rhythm.     Heart sounds: Normal heart sounds. No murmur heard. Pulmonary:     Effort: Pulmonary effort is normal. No respiratory distress.     Breath sounds: Normal breath sounds. No wheezing.  Abdominal:     General: Bowel sounds are normal. There is no distension.     Palpations: Abdomen is soft.     Tenderness: There is no abdominal tenderness.  Musculoskeletal:        General: No tenderness. Normal range of motion.     Cervical back: Normal range of motion and neck supple.  Skin:    General: Skin is warm and dry.  Neurological:     Mental Status: She is alert and oriented to person, place, and time.     Cranial Nerves: No cranial nerve deficit.     Deep Tendon Reflexes: Reflexes are normal and symmetric.  Psychiatric:        Behavior: Behavior normal.        Thought Content:  Thought content normal.        Judgment: Judgment normal.      BP (!) 159/80   Pulse 67   Temp (!) 96 F (35.6 C) (Temporal)   Ht 5' 1"  (1.549 m)   Wt 160 lb (72.6 kg)   BMI 30.23 kg/m      Assessment & Plan:  KELBI RENSTROM comes in today with chief complaint of Medical Management of Chronic Issues   Diagnosis and orders addressed:  1. Need for immunization against influenza - Flu Vaccine QUAD High Dose(Fluad) - CMP14+EGFR - CBC with Differential/Platelet  2. Hypertension associated with diabetes ([place]) PT will monitor at home, if still elevated will need to adjust her medications at next visit.  - CMP14+EGFR - CBC with Differential/Platelet  3. Coronary artery disease involving native coronary artery of native heart with unstable angina pectoris (HCC) - CMP14+EGFR - CBC with Differential/Platelet  4. Osteoarthritis, unspecified osteoarthritis type, unspecified site - CMP14+EGFR - CBC with Differential/Platelet  5. GAD (generalized anxiety disorder) - CMP14+EGFR - CBC with Differential/Platelet  6. Vitamin D deficiency - CMP14+EGFR - CBC with Differential/Platelet  7. Moderate episode of recurrent major depressive disorder (HCC) - CMP14+EGFR - CBC with Differential/Platelet  8. Class 1 obesity due to excess calories with serious comorbidity and body mass index (BMI) of 30.0 to 30.9 in adult - CMP14+EGFR - CBC with Differential/Platelet  9. Generalized  osteoarthritis of multiple sites - CMP14+EGFR - CBC with Differential/Platelet  10. Hypothyroidism, unspecified type - CMP14+EGFR - CBC with Differential/Platelet  11. Hyperlipidemia associated with type 2 diabetes mellitus (HCC) - CMP14+EGFR - CBC with Differential/Platelet  12. Type 2 diabetes mellitus with hyperglycemia, without long-term current use of insulin (HCC) - Bayer DCA Hb A1c Waived - CMP14+EGFR - CBC with Differential/Platelet  13. Type 2 diabetes mellitus with other specified  complication, without long-term current use of insulin (HCC) - CMP14+EGFR - CBC with Differential/Platelet  14. Gastroesophageal reflux disease without esophagitis - CMP14+EGFR - CBC with Differential/Platelet  15. Need for hepatitis C screening test - Hepatitis C antibody - CMP14+EGFR - CBC with Differential/Platelet   Labs pending Health Maintenance reviewed Diet and exercise encouraged  Follow up plan: 3 months    Evelina Dun, FNP

## 2021-02-22 NOTE — Patient Instructions (Signed)
Health Maintenance After Age 79 After age 79, you are at a higher risk for certain long-term diseases and infections as well as injuries from falls. Falls are a major cause of broken bones and head injuries in people who are older than age 79. Getting regular preventive care can help to keep you healthy and well. Preventive care includes getting regular testing and making lifestyle changes as recommended by your health care provider. Talk with your health care provider about: Which screenings and tests you should have. A screening is a test that checks for a disease when you have no symptoms. A diet and exercise plan that is right for you. What should I know about screenings and tests to prevent falls? Screening and testing are the best ways to find a health problem early. Early diagnosis and treatment give you the best chance of managing medical conditions that are common after age 79. Certain conditions and lifestyle choices may make you more likely to have a fall. Your health care provider may recommend: Regular vision checks. Poor vision and conditions such as cataracts can make you more likely to have a fall. If you wear glasses, make sure to get your prescription updated if your vision changes. Medicine review. Work with your health care provider to regularly review all of the medicines you are taking, including over-the-counter medicines. Ask your health care provider about any side effects that may make you more likely to have a fall. Tell your health care provider if any medicines that you take make you feel dizzy or sleepy. Strength and balance checks. Your health care provider may recommend certain tests to check your strength and balance while standing, walking, or changing positions. Foot health exam. Foot pain and numbness, as well as not wearing proper footwear, can make you more likely to have a fall. Screenings, including: Osteoporosis screening. Osteoporosis is a condition that causes  the bones to get weaker and break more easily. Blood pressure screening. Blood pressure changes and medicines to control blood pressure can make you feel dizzy. Depression screening. You may be more likely to have a fall if you have a fear of falling, feel depressed, or feel unable to do activities that you used to do. Alcohol use screening. Using too much alcohol can affect your balance and may make you more likely to have a fall. Follow these instructions at home: Lifestyle Do not drink alcohol if: Your health care provider tells you not to drink. If you drink alcohol: Limit how much you have to: 0-1 drink a day for women. 0-2 drinks a day for men. Know how much alcohol is in your drink. In the [place] one drink equals one 12 oz bottle of beer (355 mL), one 5 oz glass of wine (148 mL), or one 1 oz glass of hard liquor (44 mL). Do not use any products that contain nicotine or tobacco. These products include cigarettes, chewing tobacco, and vaping devices, such as e-cigarettes. If you need help quitting, ask your health care provider. Activity  Follow a regular exercise program to stay fit. This will help you maintain your balance. Ask your health care provider what types of exercise are appropriate for you. If you need a cane or walker, use it as recommended by your health care provider. Wear supportive shoes that have nonskid soles. Safety  Remove any tripping hazards, such as rugs, cords, and clutter. Install safety equipment such as grab bars in bathrooms and safety rails on stairs. Keep rooms and walkways   well-lit. General instructions Talk with your health care provider about your risks for falling. Tell your health care provider if: You fall. Be sure to tell your health care provider about all falls, even ones that seem minor. You feel dizzy, tiredness (fatigue), or off-balance. Take over-the-counter and prescription medicines only as told by your health care provider. These include  supplements. Eat a healthy diet and maintain a healthy weight. A healthy diet includes low-fat dairy products, low-fat (lean) meats, and fiber from whole grains, beans, and lots of fruits and vegetables. Stay current with your vaccines. Schedule regular health, dental, and eye exams. Summary Having a healthy lifestyle and getting preventive care can help to protect your health and wellness after age 79. Screening and testing are the best way to find a health problem early and help you avoid having a fall. Early diagnosis and treatment give you the best chance for managing medical conditions that are more common for people who are older than age 79. Falls are a major cause of broken bones and head injuries in people who are older than age 79. Take precautions to prevent a fall at home. Work with your health care provider to learn what changes you can make to improve your health and wellness and to prevent falls. This information is not intended to replace advice given to you by your health care provider. Make sure you discuss any questions you have with your health care provider. Document Revised: 08/15/2020 Document Reviewed: 08/15/2020 Elsevier Patient Education  2022 [employer] Inc[employer]  

## 2021-02-23 LAB — CMP14+EGFR
ALT: 10 IU/L (ref 0–32)
AST: 14 IU/L (ref 0–40)
Albumin/Globulin Ratio: 2.6 — ABNORMAL HIGH (ref 1.2–2.2)
Albumin: 4.2 g/dL (ref 3.7–4.7)
Alkaline Phosphatase: 87 IU/L (ref 44–121)
BUN/Creatinine Ratio: 10 — ABNORMAL LOW (ref 12–28)
BUN: 9 mg/dL (ref 8–27)
Bilirubin Total: 0.8 mg/dL (ref 0.0–1.2)
CO2: 25 mmol/L (ref 20–29)
Calcium: 9.5 mg/dL (ref 8.7–10.3)
Chloride: 105 mmol/L (ref 96–106)
Creatinine, Ser: 0.93 mg/dL (ref 0.57–1.00)
Globulin, Total: 1.6 g/dL (ref 1.5–4.5)
Glucose: 142 mg/dL — ABNORMAL HIGH (ref 70–99)
Potassium: 4.8 mmol/L (ref 3.5–5.2)
Sodium: 142 mmol/L (ref 134–144)
Total Protein: 5.8 g/dL — ABNORMAL LOW (ref 6.0–8.5)
eGFR: 63 mL/min/{1.73_m2} (ref >59)

## 2021-02-23 LAB — CBC WITH DIFFERENTIAL/PLATELET
Basophils Absolute: 0 10*3/uL (ref 0.0–0.2)
Basos: 1 %
EOS (ABSOLUTE): 0.1 10*3/uL (ref 0.0–0.4)
Eos: 3 %
Hematocrit: 34.8 % (ref 34.0–46.6)
Hemoglobin: 11.6 g/dL (ref 11.1–15.9)
Immature Grans (Abs): 0 10*3/uL (ref 0.0–0.1)
Immature Granulocytes: 0 %
Lymphocytes Absolute: 1.2 10*3/uL (ref 0.7–3.1)
Lymphs: 28 %
MCH: 31.3 pg (ref 26.6–33.0)
MCHC: 33.3 g/dL (ref 31.5–35.7)
MCV: 94 fL (ref 79–97)
Monocytes Absolute: 0.4 10*3/uL (ref 0.1–0.9)
Monocytes: 9 %
Neutrophils Absolute: 2.7 10*3/uL (ref 1.4–7.0)
Neutrophils: 59 %
Platelets: 138 10*3/uL — ABNORMAL LOW (ref 150–450)
RBC: 3.71 x10E6/uL — ABNORMAL LOW (ref 3.77–5.28)
RDW: 13.2 % (ref 11.7–15.4)
WBC: 4.5 10*3/uL (ref 3.4–10.8)

## 2021-02-23 LAB — HEPATITIS C ANTIBODY: Hep C Virus Ab: <0.1 s/co ratio (ref 0.0–0.9)

## 2021-03-17 ENCOUNTER — Other Ambulatory Visit: Payer: Self-pay | Admitting: Family

## 2021-03-17 ENCOUNTER — Other Ambulatory Visit: Payer: Self-pay | Admitting: Cardiology

## 2021-03-17 DIAGNOSIS — E039 Hypothyroidism, unspecified: Secondary | ICD-10-CM

## 2021-03-17 NOTE — Telephone Encounter (Signed)
Prescription refill request for Eliquis received. Indication:Afib Last office visit:4/22 Scr:0.9 Age: 80 Weight:72.6 kg  Prescription refilled

## 2021-04-07 ENCOUNTER — Other Ambulatory Visit: Payer: Self-pay | Admitting: Family

## 2021-04-07 DIAGNOSIS — Z1231 Encounter for screening mammogram for malignant neoplasm of breast: Secondary | ICD-10-CM

## 2021-04-17 ENCOUNTER — Other Ambulatory Visit: Payer: Self-pay | Admitting: Cardiology

## 2021-04-17 ENCOUNTER — Other Ambulatory Visit: Payer: Self-pay | Admitting: Family

## 2021-04-17 DIAGNOSIS — I152 Hypertension secondary to endocrine disorders: Secondary | ICD-10-CM

## 2021-04-19 ENCOUNTER — Inpatient Hospital Stay: Admission: RE | Admit: 2021-04-19 | Payer: Medicare Other | Source: Ambulatory Visit

## 2021-05-15 ENCOUNTER — Other Ambulatory Visit: Payer: Self-pay | Admitting: Gastroenterology

## 2021-05-15 ENCOUNTER — Other Ambulatory Visit: Payer: Self-pay | Admitting: Family

## 2021-05-15 DIAGNOSIS — E1159 Type 2 diabetes mellitus with other circulatory complications: Secondary | ICD-10-CM

## 2021-05-15 DIAGNOSIS — E1169 Type 2 diabetes mellitus with other specified complication: Secondary | ICD-10-CM

## 2021-05-15 DIAGNOSIS — K219 Gastro-esophageal reflux disease without esophagitis: Secondary | ICD-10-CM

## 2021-05-15 DIAGNOSIS — R112 Nausea with vomiting, unspecified: Secondary | ICD-10-CM

## 2021-05-15 NOTE — Telephone Encounter (Signed)
Needs office visit. Limited refills provided.

## 2021-05-16 ENCOUNTER — Encounter: Payer: Self-pay | Admitting: Family

## 2021-05-16 ENCOUNTER — Ambulatory Visit (INDEPENDENT_AMBULATORY_CARE_PROVIDER_SITE_OTHER): Payer: Medicare Other | Admitting: Family

## 2021-05-16 ENCOUNTER — Encounter: Payer: Self-pay | Admitting: Internal Medicine

## 2021-05-16 VITALS — BP 159/99 | HR 77 | Temp 97.7°F | Ht 61.0 in | Wt 170.8 lb

## 2021-05-16 DIAGNOSIS — R112 Nausea with vomiting, unspecified: Secondary | ICD-10-CM | POA: Diagnosis not present

## 2021-05-16 DIAGNOSIS — M1990 Unspecified osteoarthritis, unspecified site: Secondary | ICD-10-CM | POA: Diagnosis not present

## 2021-05-16 DIAGNOSIS — N3000 Acute cystitis without hematuria: Secondary | ICD-10-CM

## 2021-05-16 DIAGNOSIS — E1159 Type 2 diabetes mellitus with other circulatory complications: Secondary | ICD-10-CM | POA: Diagnosis not present

## 2021-05-16 DIAGNOSIS — M170 Bilateral primary osteoarthritis of knee: Secondary | ICD-10-CM | POA: Diagnosis not present

## 2021-05-16 DIAGNOSIS — R300 Dysuria: Secondary | ICD-10-CM | POA: Diagnosis not present

## 2021-05-16 DIAGNOSIS — I152 Hypertension secondary to endocrine disorders: Secondary | ICD-10-CM

## 2021-05-16 DIAGNOSIS — M159 Polyosteoarthritis, unspecified: Secondary | ICD-10-CM | POA: Diagnosis not present

## 2021-05-16 LAB — URINALYSIS, COMPLETE
Bilirubin, UA: NEGATIVE
Glucose, UA: NEGATIVE
Nitrite, UA: NEGATIVE
Protein,UA: NEGATIVE
Specific Gravity, UA: 1.025 (ref 1.005–1.030)
Urobilinogen, Ur: 1 mg/dL (ref 0.2–1.0)
pH, UA: 5 (ref 5.0–7.5)

## 2021-05-16 LAB — MICROSCOPIC EXAMINATION
RBC, Urine: NONE SEEN /hpf (ref 0–2)
Renal Epithel, UA: NONE SEEN /hpf

## 2021-05-16 MED ORDER — ONDANSETRON HCL 4 MG PO TABS: 4.0000 mg | ORAL_TABLET | Freq: Three times a day (TID) | ORAL | 2 refills | Status: DC | PRN

## 2021-05-16 MED ORDER — LISINOPRIL 20 MG PO TABS: 20.0000 mg | ORAL_TABLET | Freq: Every day | ORAL | 3 refills | Status: DC

## 2021-05-16 MED ORDER — DICLOFENAC SODIUM 75 MG PO TBEC: 75.0000 mg | DELAYED_RELEASE_TABLET | Freq: Two times a day (BID) | ORAL | 1 refills | Status: DC

## 2021-05-16 MED ORDER — CEPHALEXIN 500 MG PO CAPS: 500.0000 mg | ORAL_CAPSULE | Freq: Two times a day (BID) | ORAL | 0 refills | Status: DC

## 2021-05-16 NOTE — Patient Instructions (Signed)
Urinary Tract Infection, Adult A urinary tract infection (UTI) is an infection of any part of the urinary tract. The urinary tract includes the kidneys, ureters, bladder, and urethra. These organs make, store, and get rid of urine in the body. An upper UTI affects the ureters and kidneys. A lower UTI affects the bladder and urethra. What are the causes? Most urinary tract infections are caused by bacteria in your genital area around your urethra, where urine leaves your body. These bacteria grow and cause inflammation of your urinary tract. What increases the risk? You are more likely to develop this condition if: You have a urinary catheter that stays in place. You are not able to control when you urinate or have a bowel movement (incontinence). You are female and you: Use a spermicide or diaphragm for birth control. Have low estrogen levels. Are pregnant. You have certain genes that increase your risk. You are sexually active. You take antibiotic medicines. You have a condition that causes your flow of urine to slow down, such as: An enlarged prostate, if you are female. Blockage in your urethra. A kidney stone. A nerve condition that affects your bladder control (neurogenic bladder). Not getting enough to drink, or not urinating often. You have certain medical conditions, such as: Diabetes. A weak disease-fighting system (immunesystem). Sickle cell disease. Gout. Spinal cord injury. What are the signs or symptoms? Symptoms of this condition include: Needing to urinate right away (urgency). Frequent urination. This may include small amounts of urine each time you urinate. Pain or burning with urination. Blood in the urine. Urine that smells bad or unusual. Trouble urinating. Cloudy urine. Vaginal discharge, if you are female. Pain in the abdomen or the lower back. You may also have: Vomiting or a decreased appetite. Confusion. Irritability or tiredness. A fever or  chills. Diarrhea. The first symptom in older adults may be confusion. In some cases, they may not have any symptoms until the infection has worsened. How is this diagnosed? This condition is diagnosed based on your medical history and a physical exam. You may also have other tests, including: Urine tests. Blood tests. Tests for STIs (sexually transmitted infections). If you have had more than one UTI, a cystoscopy or imaging studies may be done to determine the cause of the infections. How is this treated? Treatment for this condition includes: Antibiotic medicine. Over-the-counter medicines to treat discomfort. Drinking enough water to stay hydrated. If you have frequent infections or have other conditions such as a kidney stone, you may need to see a health care provider who specializes in the urinary tract (urologist). In rare cases, urinary tract infections can cause sepsis. Sepsis is a life-threatening condition that occurs when the body responds to an infection. Sepsis is treated in the hospital with IV antibiotics, fluids, and other medicines. Follow these instructions at home: Medicines Take over-the-counter and prescription medicines only as told by your health care provider. If you were prescribed an antibiotic medicine, take it as told by your health care provider. Do not stop using the antibiotic even if you start to feel better. General instructions Make sure you: Empty your bladder often and completely. Do not hold urine for long periods of time. Empty your bladder after sex. Wipe from front to back after urinating or having a bowel movement if you are female. Use each tissue only one time when you wipe. Drink enough fluid to keep your urine pale yellow. Keep all follow-up visits. This is important. Contact a health care provider   if: Your symptoms do not get better after 1-2 days. Your symptoms go away and then return. Get help right away if: You have severe pain in your  back or your lower abdomen. You have a fever or chills. You have nausea or vomiting. Summary A urinary tract infection (UTI) is an infection of any part of the urinary tract, which includes the kidneys, ureters, bladder, and urethra. Most urinary tract infections are caused by bacteria in your genital area. Treatment for this condition often includes antibiotic medicines. If you were prescribed an antibiotic medicine, take it as told by your health care provider. Do not stop using the antibiotic even if you start to feel better. Keep all follow-up visits. This is important. This information is not intended to replace advice given to you by your health care provider. Make sure you discuss any questions you have with your health care provider. Document Revised: 11/06/2019 Document Reviewed: 11/06/2019 Elsevier Patient Education  2022 Elsevier Inc.  

## 2021-05-16 NOTE — Progress Notes (Signed)
Subjective:    Patient ID: Rhonda Garcia, female    DOB: 03-03-1941, 81 y.o.   MRN: [id number]  Chief Complaint  Patient presents with   Urinary Tract Infection   PT presents to the office today with UTI symptoms and elevated HTN.  Dysuria  This is a new problem. The current episode started 1 to 4 weeks ago. The problem occurs intermittently. The problem has been waxing and waning. Quality: pressure. The pain is at a severity of 10/10. The pain is moderate. There has been no fever. Associated symptoms include frequency, hesitancy, nausea and urgency. Pertinent negatives include no hematuria or vomiting. She has tried increased fluids for the symptoms. The treatment provided mild relief.  Arthritis Presents for follow-up visit. She complains of pain and stiffness. The symptoms have been stable. Affected locations include the left knee, right knee, left hip and right hip. Her pain is at a severity of 10/10. Associated symptoms include dysuria.  Hypertension This is a chronic problem. The current episode started more than 1 year ago. The problem has been waxing and waning since onset. The problem is uncontrolled. Associated symptoms include malaise/fatigue. Pertinent negatives include no peripheral edema or shortness of breath. The current treatment provides mild improvement.     Review of Systems  Constitutional:  Positive for malaise/fatigue.  Respiratory:  Negative for shortness of breath.   Gastrointestinal:  Positive for nausea. Negative for vomiting.  Genitourinary:  Positive for dysuria, frequency, hesitancy and urgency. Negative for hematuria.  Musculoskeletal:  Positive for arthritis and stiffness.  All other systems reviewed and are negative.     Objective:   Physical Exam Vitals reviewed.  Constitutional:      General: She is not in acute distress.    Appearance: She is well-developed. She is obese.  HENT:     Head: Normocephalic and atraumatic.  Eyes:     Pupils: Pupils  are equal, round, and reactive to light.  Neck:     Thyroid: No thyromegaly.  Cardiovascular:     Rate and Rhythm: Normal rate and regular rhythm.     Heart sounds: Normal heart sounds. No murmur heard. Pulmonary:     Effort: Pulmonary effort is normal. No respiratory distress.     Breath sounds: Normal breath sounds. No wheezing.  Abdominal:     General: Bowel sounds are normal. There is no distension.     Palpations: Abdomen is soft.     Tenderness: There is no abdominal tenderness.  Musculoskeletal:        General: No tenderness. Normal range of motion.     Cervical back: Normal range of motion and neck supple.  Skin:    General: Skin is warm and dry.  Neurological:     Mental Status: She is alert and oriented to person, place, and time.     Cranial Nerves: No cranial nerve deficit.     Deep Tendon Reflexes: Reflexes are normal and symmetric.  Psychiatric:        Behavior: Behavior normal.        Thought Content: Thought content normal.        Judgment: Judgment normal.     BP (!) 159/99    Pulse 77    Temp 97.7 F (36.5 C) (Temporal)    Ht 5\' 1"  (1.549 m)    Wt 170 lb 12.8 oz (77.5 kg)    BMI 32.27 kg/m       Assessment & Plan:  Rhonda Garcia comes  in today with chief complaint of Urinary Tract Infection   Diagnosis and orders addressed:  1. Dysuria - Urinalysis, Complete - Urine Culture  2. Acute cystitis without hematuria Start Keflex  Force fluids AZO over the counter X2 days RTO if symptoms worsen or do not improve  Culture pending - cephALEXin (KEFLEX) 500 MG capsule; Take 1 capsule (500 mg total) by mouth 2 (two) times daily.  Dispense: 14 capsule; Refill: 0  3. Generalized osteoarthritis of multiple sites Start diclofenac BID No other NSAID's  - diclofenac (VOLTAREN) 75 MG EC tablet; Take 1 tablet (75 mg total) by mouth 2 (two) times daily.  Dispense: 60 tablet; Refill: 1  4. Osteoarthritis, unspecified osteoarthritis type, unspecified site -  diclofenac (VOLTAREN) 75 MG EC tablet; Take 1 tablet (75 mg total) by mouth 2 (two) times daily.  Dispense: 60 tablet; Refill: 1  5. Hypertension associated with diabetes ([place]) Increase lisinopril to 20 mg from 10 mg  - lisinopril (ZESTRIL) 20 MG tablet; Take 1 tablet (20 mg total) by mouth daily.  Dispense: 90 tablet; Refill: 3  6. Nausea and vomiting Zofran as needed - ondansetron (ZOFRAN) 4 MG tablet; Take 1 tablet (4 mg total) by mouth every 8 (eight) hours as needed.  Dispense: 30 tablet; Refill: 2   Labs pending Health Maintenance reviewed Diet and exercise encouraged  Follow up plan: 1 month to recheck HTN and arthritis    Evelina Dun, FNP

## 2021-05-19 LAB — URINE CULTURE

## 2021-05-25 ENCOUNTER — Ambulatory Visit: Payer: Medicare Other | Admitting: Family

## 2021-05-31 ENCOUNTER — Inpatient Hospital Stay: Admission: RE | Admit: 2021-05-31 | Payer: Medicare Other | Source: Ambulatory Visit

## 2021-06-13 ENCOUNTER — Encounter: Payer: Self-pay | Admitting: Family

## 2021-06-13 ENCOUNTER — Ambulatory Visit (INDEPENDENT_AMBULATORY_CARE_PROVIDER_SITE_OTHER): Payer: Medicare Other | Admitting: Family

## 2021-06-13 VITALS — BP 113/60 | HR 69 | Temp 97.7°F | Ht 61.0 in | Wt 168.8 lb

## 2021-06-13 DIAGNOSIS — E039 Hypothyroidism, unspecified: Secondary | ICD-10-CM

## 2021-06-13 DIAGNOSIS — E1159 Type 2 diabetes mellitus with other circulatory complications: Secondary | ICD-10-CM | POA: Diagnosis not present

## 2021-06-13 DIAGNOSIS — I152 Hypertension secondary to endocrine disorders: Secondary | ICD-10-CM | POA: Diagnosis not present

## 2021-06-13 DIAGNOSIS — E1165 Type 2 diabetes mellitus with hyperglycemia: Secondary | ICD-10-CM | POA: Diagnosis not present

## 2021-06-13 DIAGNOSIS — R112 Nausea with vomiting, unspecified: Secondary | ICD-10-CM | POA: Diagnosis not present

## 2021-06-13 DIAGNOSIS — Z23 Encounter for immunization: Secondary | ICD-10-CM

## 2021-06-13 DIAGNOSIS — Z79899 Other long term (current) drug therapy: Secondary | ICD-10-CM

## 2021-06-13 DIAGNOSIS — E1169 Type 2 diabetes mellitus with other specified complication: Secondary | ICD-10-CM | POA: Diagnosis not present

## 2021-06-13 DIAGNOSIS — M1990 Unspecified osteoarthritis, unspecified site: Secondary | ICD-10-CM

## 2021-06-13 DIAGNOSIS — M159 Polyosteoarthritis, unspecified: Secondary | ICD-10-CM

## 2021-06-13 DIAGNOSIS — F1320 Sedative, hypnotic or anxiolytic dependence, uncomplicated: Secondary | ICD-10-CM

## 2021-06-13 DIAGNOSIS — F411 Generalized anxiety disorder: Secondary | ICD-10-CM

## 2021-06-13 LAB — BAYER DCA HB A1C WAIVED: HB A1C (BAYER DCA - WAIVED): 6.4 % — ABNORMAL HIGH (ref 4.8–5.6)

## 2021-06-13 MED ORDER — ONDANSETRON HCL 4 MG PO TABS: 4.0000 mg | ORAL_TABLET | Freq: Three times a day (TID) | ORAL | 2 refills | Status: DC | PRN

## 2021-06-13 MED ORDER — NITROGLYCERIN 0.4 MG SL SUBL: 0.4000 mg | SUBLINGUAL_TABLET | SUBLINGUAL | 1 refills | Status: DC | PRN

## 2021-06-13 MED ORDER — ALPRAZOLAM 0.5 MG PO TABS: 0.5000 mg | ORAL_TABLET | Freq: Every evening | ORAL | 2 refills | Status: DC | PRN

## 2021-06-13 NOTE — Patient Instructions (Signed)

## 2021-06-13 NOTE — Progress Notes (Signed)
? ?Subjective:  ? ? Patient ID: Rhonda Garcia, female    DOB: May 15, 1940, 81 y.o.   MRN: [id number] ? ?Chief Complaint  ?Patient presents with  ? Follow-up  ?  1 month to recheck HTN and arthritis  ?   ? ?PT presents to the office today to recheck HTN. She was seen on 05/16/21 and we increase lisinopril to 20 mg from 35m. Her BP is at goal.  ? ?She was started on diclofenac 75 mg BID, but states this made her throw up ans stopped it. Her pain is a 8 out 10 in her hips, knees, and hands.  ?Hypertension ?This is a chronic problem. The current episode started more than 1 year ago. The problem has been resolved since onset. The problem is controlled. Associated symptoms include anxiety and malaise/fatigue. Pertinent negatives include no blurred vision, peripheral edema or shortness of breath. Risk factors for coronary artery disease include dyslipidemia and obesity. The current treatment provides moderate improvement. Identifiable causes of hypertension include a thyroid problem.  ?Diabetes ?She presents for her follow-up diabetic visit. She has type 2 diabetes mellitus. Hypoglycemia symptoms include nervousness/anxiousness. Associated symptoms include fatigue and foot paresthesias. Pertinent negatives for diabetes include no blurred vision. Symptoms are stable. Diabetic complications include heart disease, nephropathy and peripheral neuropathy. Risk factors for coronary artery disease include dyslipidemia, diabetes mellitus, hypertension, sedentary lifestyle and post-menopausal. (Does not check her BS at home) Eye exam is not current.  ?Thyroid Problem ?Presents for follow-up visit. Symptoms include anxiety, dry skin, fatigue and hoarse voice. Patient reports no constipation. The symptoms have been stable.  ?Arthritis ?Presents for follow-up visit. She complains of pain and stiffness. The symptoms have been stable. Affected locations include the right knee, left knee, left MCP and right MCP. Her pain is at a  severity of 8/10. Associated symptoms include fatigue.  ?Anxiety ?Presents for follow-up visit. Symptoms include excessive worry, irritability, nervous/anxious behavior and restlessness. Patient reports no shortness of breath. Symptoms occur most days. The severity of symptoms is moderate.  ? ? ? ? ? ?Review of Systems  ?Constitutional:  Positive for fatigue, irritability and malaise/fatigue.  ?HENT:  Positive for hoarse voice.   ?Eyes:  Negative for blurred vision.  ?Respiratory:  Negative for shortness of breath.   ?Gastrointestinal:  Negative for constipation.  ?Musculoskeletal:  Positive for arthritis and stiffness.  ?Psychiatric/Behavioral:  The patient is nervous/anxious.   ?All other systems reviewed and are negative. ? ?   ?Objective:  ? Physical Exam ?Vitals reviewed.  ?Constitutional:   ?   General: She is not in acute distress. ?   Appearance: She is well-developed. She is obese.  ?HENT:  ?   Head: Normocephalic and atraumatic.  ?   Right Ear: Tympanic membrane normal.  ?   Left Ear: Tympanic membrane normal.  ?Eyes:  ?   Pupils: Pupils are equal, round, and reactive to light.  ?Neck:  ?   Thyroid: No thyromegaly.  ?Cardiovascular:  ?   Rate and Rhythm: Normal rate and regular rhythm.  ?   Heart sounds: Normal heart sounds. No murmur heard. ?Pulmonary:  ?   Effort: Pulmonary effort is normal. No respiratory distress.  ?   Breath sounds: Normal breath sounds. No wheezing.  ?Abdominal:  ?   General: Bowel sounds are normal. There is no distension.  ?   Palpations: Abdomen is soft.  ?   Tenderness: There is no abdominal tenderness.  ?Musculoskeletal:     ?  General: No tenderness. Normal range of motion.  ?   Cervical back: Normal range of motion and neck supple.  ?Skin: ?   General: Skin is warm and dry.  ?Neurological:  ?   Mental Status: She is alert and oriented to person, place, and time.  ?   Cranial Nerves: No cranial nerve deficit.  ?   Deep Tendon Reflexes: Reflexes are normal and symmetric.   ?Psychiatric:     ?   Behavior: Behavior normal.     ?   Thought Content: Thought content normal.     ?   Judgment: Judgment normal.  ? ? ? ? ?BP 113/60   Pulse 69   Temp 97.7 ?F (36.5 ?C) (Temporal)   Ht 5' 1" (1.549 m)   Wt 168 lb 12.8 oz (76.6 kg)   BMI 31.89 kg/m?  ? ?   ?Assessment & Plan:  ?Rhonda Garcia comes in today with chief complaint of Follow-up (1 month to recheck HTN and arthritis / ) ? ? ?Diagnosis and orders addressed: ? ?1. Nausea and vomiting ?- ondansetron (ZOFRAN) 4 MG tablet; Take 1 tablet (4 mg total) by mouth every 8 (eight) hours as needed.  Dispense: 30 tablet; Refill: 2 ?- CMP14+EGFR ?- CBC with Differential/Platelet ? ?2. Hypertension associated with diabetes ([place]) ?- CMP14+EGFR ?- CBC with Differential/Platelet ? ?3. Type 2 diabetes mellitus with hyperglycemia, without long-term current use of insulin ([place]) ?- CMP14+EGFR ?- CBC with Differential/Platelet ?- Bayer DCA Hb A1c Waived ?- Microalbumin / creatinine urine ratio ? ?4. Generalized osteoarthritis of multiple sites ?- CMP14+EGFR ?- CBC with Differential/Platelet ? ?5. Osteoarthritis, unspecified osteoarthritis type, unspecified site ?- CMP14+EGFR ?- CBC with Differential/Platelet ? ?6. Type 2 diabetes mellitus with other specified complication, without long-term current use of insulin ([place]) ?- CMP14+EGFR ?- CBC with Differential/Platelet ? ?7. Hypothyroidism, unspecified type ? ?- CMP14+EGFR ?- CBC with Differential/Platelet ?- TSH ? ?8. GAD (generalized anxiety disorder) ?- ALPRAZolam (XANAX) 0.5 MG tablet; Take 1 tablet (0.5 mg total) by mouth at bedtime as needed.  Dispense: 30 tablet; Refill: 2 ?- CMP14+EGFR ?- CBC with Differential/Platelet ?- ToxASSURE Select 13 (MW), Urine ? ?9. Controlled substance agreement signed ?- ALPRAZolam (XANAX) 0.5 MG tablet; Take 1 tablet (0.5 mg total) by mouth at bedtime as needed.  Dispense: 30 tablet; Refill: 2 ?- CMP14+EGFR ?- CBC with Differential/Platelet ?- ToxASSURE Select 13  (MW), Urine ? ?10. Benzodiazepine dependence ([place]) ?- ALPRAZolam (XANAX) 0.5 MG tablet; Take 1 tablet (0.5 mg total) by mouth at bedtime as needed.  Dispense: 30 tablet; Refill: 2 ?- CMP14+EGFR ?- CBC with Differential/Platelet ?- ToxASSURE Select 13 (MW), Urine ? ? ?Labs pending ?Patient reviewed in [place] controlled database, no flags noted. Contract and drug screen up dated today ?Health Maintenance reviewed ?Diet and exercise encouraged ? ?Follow up plan: ?3 months  ? ? ?Evelina Dun, FNP ? ? ? ?

## 2021-06-13 NOTE — Addendum Note (Signed)
Addended by: Brynda Peon F on: 06/13/2021 11:22 AM ? ? Modules accepted: Orders ? ?

## 2021-06-13 NOTE — Addendum Note (Signed)
Addended by: Brynda Peon F on: 06/13/2021 10:47 AM ? ? Modules accepted: Orders ? ?

## 2021-06-14 ENCOUNTER — Ambulatory Visit
Admission: RE | Admit: 2021-06-14 | Discharge: 2021-06-14 | Disposition: A | Discharge status: Home / Self Care | Payer: Medicare Other | Source: Ambulatory Visit | Attending: Family | Admitting: Family

## 2021-06-14 ENCOUNTER — Other Ambulatory Visit: Payer: Self-pay

## 2021-06-14 ENCOUNTER — Other Ambulatory Visit: Payer: Self-pay | Admitting: Family

## 2021-06-14 DIAGNOSIS — Z1231 Encounter for screening mammogram for malignant neoplasm of breast: Secondary | ICD-10-CM

## 2021-06-14 DIAGNOSIS — R7989 Other specified abnormal findings of blood chemistry: Secondary | ICD-10-CM

## 2021-06-15 ENCOUNTER — Other Ambulatory Visit: Payer: Medicare Other

## 2021-06-15 DIAGNOSIS — R7989 Other specified abnormal findings of blood chemistry: Secondary | ICD-10-CM | POA: Diagnosis not present

## 2021-06-15 DIAGNOSIS — E1165 Type 2 diabetes mellitus with hyperglycemia: Secondary | ICD-10-CM | POA: Diagnosis not present

## 2021-06-16 LAB — BMP8+EGFR
BUN/Creatinine Ratio: 16 (ref 12–28)
BUN: 14 mg/dL (ref 8–27)
CO2: 23 mmol/L (ref 20–29)
Calcium: 9.7 mg/dL (ref 8.7–10.3)
Chloride: 102 mmol/L (ref 96–106)
Creatinine, Ser: 0.86 mg/dL (ref 0.57–1.00)
Glucose: 140 mg/dL — ABNORMAL HIGH (ref 70–99)
Potassium: 4.4 mmol/L (ref 3.5–5.2)
Sodium: 141 mmol/L (ref 134–144)
eGFR: 69 mL/min/{1.73_m2} (ref >59)

## 2021-06-16 LAB — MICROALBUMIN / CREATININE URINE RATIO
Creatinine, Urine: 140.6 mg/dL
Microalb/Creat Ratio: 9 mg/g creat (ref 0–29)
Microalbumin, Urine: 12.1 ug/mL

## 2021-06-17 LAB — DRUG SCREEN 10 W/CONF, SERUM
Amphetamines, IA: NEGATIVE ng/mL
Barbiturates, IA: NEGATIVE ug/mL
Benzodiazepines, IA: NEGATIVE ng/mL
Cocaine & Metabolite, IA: NEGATIVE ng/mL
Methadone, IA: NEGATIVE ng/mL
Opiates, IA: NEGATIVE ng/mL
Oxycodones, IA: NEGATIVE ng/mL
Phencyclidine, IA: NEGATIVE ng/mL
Propoxyphene, IA: NEGATIVE ng/mL
THC(Marijuana) Metabolite, IA: NEGATIVE ng/mL

## 2021-06-17 LAB — CBC WITH DIFFERENTIAL/PLATELET
Basophils Absolute: 0.1 10*3/uL (ref 0.0–0.2)
Basos: 1 %
EOS (ABSOLUTE): 0.1 10*3/uL (ref 0.0–0.4)
Eos: 1 %
Hematocrit: 36.2 % (ref 34.0–46.6)
Hemoglobin: 11.5 g/dL (ref 11.1–15.9)
Immature Grans (Abs): 0 10*3/uL (ref 0.0–0.1)
Immature Granulocytes: 0 %
Lymphocytes Absolute: 1.8 10*3/uL (ref 0.7–3.1)
Lymphs: 20 %
MCH: 29 pg (ref 26.6–33.0)
MCHC: 31.8 g/dL (ref 31.5–35.7)
MCV: 91 fL (ref 79–97)
Monocytes Absolute: 0.8 10*3/uL (ref 0.1–0.9)
Monocytes: 9 %
Neutrophils Absolute: 6.3 10*3/uL (ref 1.4–7.0)
Neutrophils: 69 %
Platelets: 145 10*3/uL — ABNORMAL LOW (ref 150–450)
RBC: 3.97 x10E6/uL (ref 3.77–5.28)
RDW: 13.5 % (ref 11.7–15.4)
WBC: 9.1 10*3/uL (ref 3.4–10.8)

## 2021-06-17 LAB — CMP14+EGFR
ALT: 8 IU/L (ref 0–32)
AST: 16 IU/L (ref 0–40)
Albumin/Globulin Ratio: 1.9 (ref 1.2–2.2)
Albumin: 4.3 g/dL (ref 3.7–4.7)
Alkaline Phosphatase: 79 IU/L (ref 44–121)
BUN/Creatinine Ratio: 15 (ref 12–28)
BUN: 22 mg/dL (ref 8–27)
Bilirubin Total: 0.9 mg/dL (ref 0.0–1.2)
CO2: 24 mmol/L (ref 20–29)
Calcium: 9.4 mg/dL (ref 8.7–10.3)
Chloride: 103 mmol/L (ref 96–106)
Creatinine, Ser: 1.46 mg/dL — ABNORMAL HIGH (ref 0.57–1.00)
Globulin, Total: 2.3 g/dL (ref 1.5–4.5)
Glucose: 130 mg/dL — ABNORMAL HIGH (ref 70–99)
Potassium: 4.3 mmol/L (ref 3.5–5.2)
Sodium: 140 mmol/L (ref 134–144)
Total Protein: 6.6 g/dL (ref 6.0–8.5)
eGFR: 36 mL/min/{1.73_m2} — ABNORMAL LOW (ref >59)

## 2021-06-17 LAB — TSH: TSH: 1.2 u[IU]/mL (ref 0.450–4.500)

## 2021-08-10 ENCOUNTER — Other Ambulatory Visit: Payer: Self-pay | Admitting: Family

## 2021-08-10 ENCOUNTER — Other Ambulatory Visit: Payer: Self-pay | Admitting: Cardiology

## 2021-08-10 DIAGNOSIS — K219 Gastro-esophageal reflux disease without esophagitis: Secondary | ICD-10-CM

## 2021-08-10 DIAGNOSIS — E039 Hypothyroidism, unspecified: Secondary | ICD-10-CM

## 2021-08-10 DIAGNOSIS — E785 Hyperlipidemia, unspecified: Secondary | ICD-10-CM

## 2021-08-10 NOTE — Telephone Encounter (Signed)
Prescription refill request for Eliquis received. ?Indication:Afib ?Last office visit:Needs Appointment ?Scr:0.8 ?Age: 81 ?Weight:76.6 kg ? ?Prescription refilled ? ?

## 2021-09-14 ENCOUNTER — Encounter: Payer: Self-pay | Admitting: Family

## 2021-09-14 ENCOUNTER — Other Ambulatory Visit: Payer: Self-pay | Admitting: Family

## 2021-09-14 ENCOUNTER — Ambulatory Visit (INDEPENDENT_AMBULATORY_CARE_PROVIDER_SITE_OTHER): Payer: Medicare Other | Admitting: Family

## 2021-09-14 VITALS — BP 113/67 | HR 70 | Temp 98.6°F | Ht 62.0 in | Wt 165.0 lb

## 2021-09-14 DIAGNOSIS — F411 Generalized anxiety disorder: Secondary | ICD-10-CM

## 2021-09-14 DIAGNOSIS — Z79899 Other long term (current) drug therapy: Secondary | ICD-10-CM | POA: Diagnosis not present

## 2021-09-14 DIAGNOSIS — F331 Major depressive disorder, recurrent, moderate: Secondary | ICD-10-CM

## 2021-09-14 DIAGNOSIS — E1169 Type 2 diabetes mellitus with other specified complication: Secondary | ICD-10-CM | POA: Diagnosis not present

## 2021-09-14 DIAGNOSIS — K219 Gastro-esophageal reflux disease without esophagitis: Secondary | ICD-10-CM

## 2021-09-14 DIAGNOSIS — I480 Paroxysmal atrial fibrillation: Secondary | ICD-10-CM

## 2021-09-14 DIAGNOSIS — R350 Frequency of micturition: Secondary | ICD-10-CM

## 2021-09-14 DIAGNOSIS — E6609 Other obesity due to excess calories: Secondary | ICD-10-CM

## 2021-09-14 DIAGNOSIS — I152 Hypertension secondary to endocrine disorders: Secondary | ICD-10-CM | POA: Diagnosis not present

## 2021-09-14 DIAGNOSIS — I252 Old myocardial infarction: Secondary | ICD-10-CM

## 2021-09-14 DIAGNOSIS — Z6830 Body mass index (BMI) 30.0-30.9, adult: Secondary | ICD-10-CM

## 2021-09-14 DIAGNOSIS — E785 Hyperlipidemia, unspecified: Secondary | ICD-10-CM

## 2021-09-14 DIAGNOSIS — F1320 Sedative, hypnotic or anxiolytic dependence, uncomplicated: Secondary | ICD-10-CM | POA: Diagnosis not present

## 2021-09-14 DIAGNOSIS — E1159 Type 2 diabetes mellitus with other circulatory complications: Secondary | ICD-10-CM | POA: Diagnosis not present

## 2021-09-14 DIAGNOSIS — M1990 Unspecified osteoarthritis, unspecified site: Secondary | ICD-10-CM

## 2021-09-14 DIAGNOSIS — E559 Vitamin D deficiency, unspecified: Secondary | ICD-10-CM

## 2021-09-14 DIAGNOSIS — K5900 Constipation, unspecified: Secondary | ICD-10-CM

## 2021-09-14 DIAGNOSIS — D649 Anemia, unspecified: Secondary | ICD-10-CM

## 2021-09-14 DIAGNOSIS — E039 Hypothyroidism, unspecified: Secondary | ICD-10-CM

## 2021-09-14 DIAGNOSIS — E66811 Obesity, class 1: Secondary | ICD-10-CM

## 2021-09-14 DIAGNOSIS — I25110 Atherosclerotic heart disease of native coronary artery with unstable angina pectoris: Secondary | ICD-10-CM

## 2021-09-14 LAB — BAYER DCA HB A1C WAIVED: HB A1C (BAYER DCA - WAIVED): 6.4 % — ABNORMAL HIGH (ref 4.8–5.6)

## 2021-09-14 MED ORDER — LINACLOTIDE 72 MCG PO CAPS: 72.0000 ug | ORAL_CAPSULE | Freq: Every day | ORAL | 1 refills | Status: DC

## 2021-09-14 MED ORDER — TRAMADOL HCL 50 MG PO TABS: 50.0000 mg | ORAL_TABLET | Freq: Three times a day (TID) | ORAL | 2 refills | Status: DC | PRN

## 2021-09-14 MED ORDER — BLOOD GLUCOSE METER KIT: PACK | 0 refills | Status: DC

## 2021-09-14 NOTE — Patient Instructions (Signed)
Osteoarthritis  Osteoarthritis is a type of arthritis. It refers to joint pain or joint disease. Osteoarthritis affects tissue that covers the ends of bones in joints (cartilage). Cartilage acts as a cushion between the bones and helps them move smoothly. Osteoarthritis occurs when cartilage in the joints gets worn down. Osteoarthritis is sometimes called "wear and tear" arthritis. Osteoarthritis is the most common form of arthritis. It often occurs in older people. It is a condition that gets worse over time. The joints most often affected by this condition are in the fingers, toes, hips, knees, and spine, including the neck and lower back. What are the causes? This condition is caused by the wearing down of cartilage that covers the ends of bones. What increases the risk? The following factors may make you more likely to develop this condition: Being age 50 or older. Obesity. Overuse of joints. Past injury of a joint. Past surgery on a joint. Family history of osteoarthritis. What are the signs or symptoms? The main symptoms of this condition are pain, swelling, and stiffness in the joint. Other symptoms may include: An enlarged joint. More pain and further damage caused by small pieces of bone or cartilage that break off and float inside of the joint. Small deposits of bone (osteophytes) that grow on the edges of the joint. A grating or scraping feeling inside the joint when you move it. Popping or creaking sounds when you move. Difficulty walking or exercising. An inability to grip items, twist your hand(s), or control the movements of your hands and fingers. How is this diagnosed? This condition may be diagnosed based on: Your medical history. A physical exam. Your symptoms. X-rays of the affected joint(s). Blood tests to rule out other types of arthritis. How is this treated? There is no cure for this condition, but treatment can help control pain and improve joint function.  Treatment may include a combination of therapies, such as: Pain relief techniques, such as: Applying heat and cold to the joint. Massage. A form of talk therapy called cognitive behavioral therapy (CBT). This therapy helps you set goals and follow up on the changes that you make. Medicines for pain and inflammation. The medicines can be taken by mouth or applied to the skin. They include: NSAIDs, such as ibuprofen. Prescription medicines. Strong anti-inflammatory medicines (corticosteroids). Certain nutritional supplements. A prescribed exercise program. You may work with a physical therapist. Assistive devices, such as a brace, wrap, splint, specialized glove, or cane. A weight control plan. Surgery, such as: An osteotomy. This is done to reposition the bones and relieve pain or to remove loose pieces of bone and cartilage. Joint replacement surgery. You may need this surgery if you have advanced osteoarthritis. Follow these instructions at home: Activity Rest your affected joints as told by your health care provider. Exercise as told by your health care provider. He or she may recommend specific types of exercise, such as: Strengthening exercises. These are done to strengthen the muscles that support joints affected by arthritis. Aerobic activities. These are exercises, such as brisk walking or water aerobics, that increase your heart rate. Range-of-motion activities. These help your joints move more easily. Balance and agility exercises. Managing pain, stiffness, and swelling     If directed, apply heat to the affected area as often as told by your health care provider. Use the heat source that your health care provider recommends, such as a moist heat pack or a heating pad. If you have a removable assistive device, remove it   as told by your health care provider. Place a towel between your skin and the heat source. If your health care provider tells you to keep the assistive device  on while you apply heat, place a towel between the assistive device and the heat source. Leave the heat on for 20-30 minutes. Remove the heat if your skin turns bright red. This is especially important if you are unable to feel pain, heat, or cold. You may have a greater risk of getting burned. If directed, put ice on the affected area. To do this: If you have a removable assistive device, remove it as told by your health care provider. Put ice in a plastic bag. Place a towel between your skin and the bag. If your health care provider tells you to keep the assistive device on during icing, place a towel between the assistive device and the bag. Leave the ice on for 20 minutes, 2-3 times a day. Move your fingers or toes often to reduce stiffness and swelling. Raise (elevate) the injured area above the level of your heart while you are sitting or lying down. General instructions Take over-the-counter and prescription medicines only as told by your health care provider. Maintain a healthy weight. Follow instructions from your health care provider for weight control. Do not use any products that contain nicotine or tobacco, such as cigarettes, e-cigarettes, and chewing tobacco. If you need help quitting, ask your health care provider. Use assistive devices as told by your health care provider. Keep all follow-up visits as told by your health care provider. This is important. Where to find more information National Institute of Arthritis and Musculoskeletal and Skin Diseases: www.niams.nih.gov National Institute on Aging: www.nia.nih.gov American College of Rheumatology: www.rheumatology.org Contact a health care provider if: You have redness, swelling, or a feeling of warmth in a joint that gets worse. You have a fever along with joint or muscle aches. You develop a rash. You have trouble doing your normal activities. Get help right away if: You have pain that gets worse and is not relieved by  pain medicine. Summary Osteoarthritis is a type of arthritis that affects tissue covering the ends of bones in joints (cartilage). This condition is caused by the wearing down of cartilage that covers the ends of bones. The main symptom of this condition is pain, swelling, and stiffness in the joint. There is no cure for this condition, but treatment can help control pain and improve joint function. This information is not intended to replace advice given to you by your health care provider. Make sure you discuss any questions you have with your health care provider. Document Revised: 03/23/2019 Document Reviewed: 03/23/2019 Elsevier Patient Education  2023 Elsevier Inc.  

## 2021-09-14 NOTE — Progress Notes (Signed)
Subjective:    Patient ID: Rhonda Garcia, female    DOB: [birth year], 81 y.o.   MRN: [id number]  Chief Complaint  Patient presents with   Follow-up   Diabetes    Pt states this has been going well   Anxiety    Pt states this has been doing okay    Arthritis    Pt states it is keeping her up at night    PT presents to the office today for chronic follow up. She is followed by Cardiologists every 6 months for A Fib and CAD. She is followed by GI as needed for constipation, GERD.   She had elevated rheumatoid arthritis and saw a rheumatologists. She states her pain is a 10 out 10.  Diabetes She presents for her follow-up diabetic visit. She has type 2 diabetes mellitus. Hypoglycemia symptoms include nervousness/anxiousness. Associated symptoms include blurred vision and fatigue. Pertinent negatives for diabetes include no foot paresthesias. Symptoms are stable. Diabetic complications include heart disease and nephropathy. Pertinent negatives for diabetic complications include no peripheral neuropathy. Risk factors for coronary artery disease include dyslipidemia, diabetes mellitus, hypertension, sedentary lifestyle and post-menopausal. She is following a generally unhealthy diet. (Does not check BS at home) An ACE inhibitor/angiotensin II receptor blocker is being taken. Eye exam is not current.  Anxiety Presents for follow-up visit. Symptoms include excessive worry, irritability and nervous/anxious behavior. Patient reports no nausea or shortness of breath. Symptoms occur rarely. The severity of symptoms is mild.    Arthritis Presents for follow-up visit. She complains of pain and stiffness. The symptoms have been worsening. Affected locations include the right knee, left knee, left hip and right hip. Her pain is at a severity of 10/10. Associated symptoms include fatigue.  Hypertension This is a chronic problem. The current episode started more than 1 year ago. The problem has been  resolved since onset. Associated symptoms include anxiety, blurred vision and malaise/fatigue. Pertinent negatives include no peripheral edema or shortness of breath. Risk factors for coronary artery disease include dyslipidemia, diabetes mellitus, obesity and sedentary lifestyle. The current treatment provides moderate improvement. Hypertensive end-organ damage includes CAD/MI. Identifiable causes of hypertension include a thyroid problem.  Gastroesophageal Reflux She complains of belching and heartburn. She reports no hoarse voice or no nausea. This is a chronic problem. The current episode started more than 1 year ago. The problem occurs occasionally. The problem has been waxing and waning. Associated symptoms include fatigue. She has tried a PPI for the symptoms. The treatment provided moderate relief.  Thyroid Problem Presents for follow-up visit. Symptoms include anxiety, constipation and fatigue. Patient reports no dry skin or hoarse voice. The symptoms have been stable. Her past medical history is significant for hyperlipidemia.  Hyperlipidemia This is a chronic problem. The current episode started more than 1 year ago. The problem is controlled. Exacerbating diseases include obesity. Pertinent negatives include no shortness of breath. Current antihyperlipidemic treatment includes statins. The current treatment provides moderate improvement of lipids. Risk factors for coronary artery disease include dyslipidemia, diabetes mellitus, hypertension, female sex and a sedentary lifestyle.  Depression        This is a chronic problem.  The current episode started more than 1 year ago.   The onset quality is gradual.   The problem occurs intermittently.  Associated symptoms include fatigue.  Associated symptoms include no helplessness, no hopelessness and not sad.  Past treatments include SSRIs - Selective serotonin reuptake inhibitors.  Past medical history includes thyroid problem and  anxiety.    Constipation This is a chronic problem. The current episode started more than 1 year ago. Her stool frequency is 4 to 5 times per week. Pertinent negatives include no nausea. Risk factors include obesity. She has tried laxatives for the symptoms. The treatment provided moderate relief.  Urinary Frequency  This is a new problem. The current episode started 1 to 4 weeks ago. The problem occurs intermittently. The problem has been waxing and waning. The pain is at a severity of 0/10. The patient is experiencing no pain. Associated symptoms include frequency and urgency. Pertinent negatives include no hematuria or nausea. She has tried increased fluids for the symptoms. The treatment provided mild relief.      Review of Systems  Constitutional:  Positive for fatigue, irritability and malaise/fatigue.  HENT:  Negative for hoarse voice.   Eyes:  Positive for blurred vision.  Respiratory:  Negative for shortness of breath.   Gastrointestinal:  Positive for constipation and heartburn. Negative for nausea.  Genitourinary:  Positive for frequency and urgency. Negative for hematuria.  Musculoskeletal:  Positive for arthritis and stiffness.  Psychiatric/Behavioral:  Positive for depression. The patient is nervous/anxious.   All other systems reviewed and are negative.      Objective:   Physical Exam Vitals reviewed.  Constitutional:      General: She is not in acute distress.    Appearance: She is well-developed. She is obese.  HENT:     Head: Normocephalic and atraumatic.     Right Ear: Tympanic membrane normal.     Left Ear: Tympanic membrane normal.  Eyes:     Pupils: Pupils are equal, round, and reactive to light.  Neck:     Thyroid: No thyromegaly.  Cardiovascular:     Rate and Rhythm: Normal rate and regular rhythm.     Heart sounds: Normal heart sounds. No murmur heard. Pulmonary:     Effort: Pulmonary effort is normal. No respiratory distress.     Breath sounds: Normal breath  sounds. No wheezing.  Abdominal:     General: Bowel sounds are normal. There is no distension.     Palpations: Abdomen is soft.     Tenderness: There is no abdominal tenderness.  Musculoskeletal:        General: No tenderness. Normal range of motion.     Cervical back: Normal range of motion and neck supple.  Skin:    General: Skin is warm and dry.  Neurological:     Mental Status: She is alert and oriented to person, place, and time.     Cranial Nerves: No cranial nerve deficit.     Deep Tendon Reflexes: Reflexes are normal and symmetric.  Psychiatric:        Behavior: Behavior normal.        Thought Content: Thought content normal.        Judgment: Judgment normal.       BP 113/67   Pulse 70   Temp 98.6 F (37 C)   Ht 5' 2"  (1.575 m)   Wt 165 lb (74.8 kg)   SpO2 98%   BMI 30.18 kg/m      Assessment & Plan:  Rhonda Garcia comes in today with chief complaint of Follow-up, Diabetes (Pt states this has been going well), Anxiety (Pt states this has been doing okay ), and Arthritis (Pt states it is keeping her up at night )   Diagnosis and orders addressed:  1. GAD (generalized anxiety disorder) - CBC  with Differential/Platelet - CMP14+EGFR  2. Controlled substance agreement signed - CBC with Differential/Platelet - CMP14+EGFR  3. Benzodiazepine dependence (HCC) - CBC with Differential/Platelet - CMP14+EGFR  4. Hypertension associated with diabetes ([place]) - CBC with Differential/Platelet - CMP14+EGFR  5. Gastroesophageal reflux disease without esophagitis - CBC with Differential/Platelet - CMP14+EGFR  6. Coronary artery disease involving native coronary artery of native heart with unstable angina pectoris (HCC) - CBC with Differential/Platelet - CMP14+EGFR  7. Hypothyroidism, unspecified type - CBC with Differential/Platelet - CMP14+EGFR  8. Hyperlipidemia associated with type 2 diabetes mellitus ([place]) - CBC with Differential/Platelet -  CMP14+EGFR  9. Type 2 diabetes mellitus with other specified complication, without long-term current use of insulin ([facility]CC) - CBC with Differential/Platelet - CMP14+EGFR - Bayer DCA Hb A1c Waived - blood glucose meter kit and supplies; Dispense based on patient and insurance preference. Use up to four times daily as directed. (FOR ICD-10 E10.9, E11.9).  Dispense: 1 each; Refill: 0  10. Osteoarthritis, unspecified osteoarthritis type, unspecified site Ultram as needed  Pt will stop Xanax. Only takes it as needed and states she goes weeks without it.  - CBC with Differential/Platelet - CMP14+EGFR - traMADol (ULTRAM) 50 MG tablet; Take 1 tablet (50 mg total) by mouth every 8 (eight) hours as needed.  Dispense: 60 tablet; Refill: 2  11. Moderate episode of recurrent major depressive disorder (HCC) - CBC with Differential/Platelet - CMP14+EGFR  12. Constipation, unspecified constipation type Will decrease Linzess to 72 mcg from 145 mcg because of diarrhea Fiber diet Force fluids - CBC with Differential/Platelet - CMP14+EGFR - linaclotide (LINZESS) 72 MCG capsule; Take 1 capsule (72 mcg total) by mouth daily before breakfast.  Dispense: 90 capsule; Refill: 1  13. Anemia, unspecified type - CBC with Differential/Platelet - CMP14+EGFR  14. Hx of non-ST elevation myocardial infarction (NSTEMI) - CBC with Differential/Platelet - CMP14+EGFR  15. Class 1 obesity due to excess calories with serious comorbidity and body mass index (BMI) of 30.0 to 30.9 in adult - CBC with Differential/Platelet - CMP14+EGFR  16. Vitamin D deficiency - CBC with Differential/Platelet - CMP14+EGFR  17. Paroxysmal atrial fibrillation (HCC) - CBC with Differential/Platelet - CMP14+EGFR  18. Urinary frequency - Urinalysis, Complete   Labs pending Health Maintenance reviewed Diet and exercise encouraged  Follow up plan: 3 months and call and make follow up with Rheumatologists    Evelina Dun,  FNP

## 2021-09-15 LAB — CMP14+EGFR
ALT: 7 IU/L (ref 0–32)
AST: 15 IU/L (ref 0–40)
Albumin/Globulin Ratio: 1.7 (ref 1.2–2.2)
Albumin: 4.1 g/dL (ref 3.7–4.7)
Alkaline Phosphatase: 81 IU/L (ref 44–121)
BUN/Creatinine Ratio: 12 (ref 12–28)
BUN: 11 mg/dL (ref 8–27)
Bilirubin Total: 0.8 mg/dL (ref 0.0–1.2)
CO2: 25 mmol/L (ref 20–29)
Calcium: 9.6 mg/dL (ref 8.7–10.3)
Chloride: 103 mmol/L (ref 96–106)
Creatinine, Ser: 0.94 mg/dL (ref 0.57–1.00)
Globulin, Total: 2.4 g/dL (ref 1.5–4.5)
Glucose: 143 mg/dL — ABNORMAL HIGH (ref 70–99)
Potassium: 4.6 mmol/L (ref 3.5–5.2)
Sodium: 141 mmol/L (ref 134–144)
Total Protein: 6.5 g/dL (ref 6.0–8.5)
eGFR: 61 mL/min/{1.73_m2} (ref >59)

## 2021-09-15 LAB — CBC WITH DIFFERENTIAL/PLATELET
Basophils Absolute: 0.1 10*3/uL (ref 0.0–0.2)
Basos: 1 %
EOS (ABSOLUTE): 0.1 10*3/uL (ref 0.0–0.4)
Eos: 2 %
Hematocrit: 35.1 % (ref 34.0–46.6)
Hemoglobin: 11.5 g/dL (ref 11.1–15.9)
Immature Grans (Abs): 0 10*3/uL (ref 0.0–0.1)
Immature Granulocytes: 0 %
Lymphocytes Absolute: 1.5 10*3/uL (ref 0.7–3.1)
Lymphs: 24 %
MCH: 30.1 pg (ref 26.6–33.0)
MCHC: 32.8 g/dL (ref 31.5–35.7)
MCV: 92 fL (ref 79–97)
Monocytes Absolute: 0.5 10*3/uL (ref 0.1–0.9)
Monocytes: 9 %
Neutrophils Absolute: 4 10*3/uL (ref 1.4–7.0)
Neutrophils: 64 %
Platelets: 137 10*3/uL — ABNORMAL LOW (ref 150–450)
RBC: 3.82 x10E6/uL (ref 3.77–5.28)
RDW: 13.2 % (ref 11.7–15.4)
WBC: 6.2 10*3/uL (ref 3.4–10.8)

## 2021-09-20 ENCOUNTER — Telehealth: Payer: Self-pay | Admitting: Family

## 2021-09-21 NOTE — Telephone Encounter (Signed)
Pt has been informed. She has no concerns at this time.

## 2021-09-28 ENCOUNTER — Other Ambulatory Visit: Payer: Self-pay | Admitting: *Deleted

## 2021-09-28 MED ORDER — METFORMIN HCL 1000 MG PO TABS: 1000.0000 mg | ORAL_TABLET | Freq: Two times a day (BID) | ORAL | 0 refills | Status: DC

## 2021-11-07 ENCOUNTER — Other Ambulatory Visit: Payer: Self-pay | Admitting: Family

## 2021-11-07 ENCOUNTER — Other Ambulatory Visit: Payer: Self-pay | Admitting: Cardiology

## 2021-11-07 DIAGNOSIS — E1169 Type 2 diabetes mellitus with other specified complication: Secondary | ICD-10-CM

## 2021-11-07 DIAGNOSIS — K219 Gastro-esophageal reflux disease without esophagitis: Secondary | ICD-10-CM

## 2021-11-07 NOTE — Telephone Encounter (Signed)
Prescription refill request for Eliquis received. Indication:Afib Last office visit:upcoming Scr:0.9 Age: 81 Weight:74.8 kg  Prescription refilled

## 2021-11-09 ENCOUNTER — Other Ambulatory Visit: Payer: Self-pay | Admitting: Family

## 2021-11-09 DIAGNOSIS — E1169 Type 2 diabetes mellitus with other specified complication: Secondary | ICD-10-CM

## 2021-12-18 ENCOUNTER — Encounter: Payer: Self-pay | Admitting: Family

## 2021-12-18 ENCOUNTER — Ambulatory Visit (INDEPENDENT_AMBULATORY_CARE_PROVIDER_SITE_OTHER): Payer: Medicare Other | Admitting: Family

## 2021-12-18 VITALS — BP 132/60 | HR 72 | Temp 97.1°F | Resp 20 | Ht 62.0 in | Wt 168.0 lb

## 2021-12-18 DIAGNOSIS — F331 Major depressive disorder, recurrent, moderate: Secondary | ICD-10-CM

## 2021-12-18 DIAGNOSIS — K219 Gastro-esophageal reflux disease without esophagitis: Secondary | ICD-10-CM

## 2021-12-18 DIAGNOSIS — M170 Bilateral primary osteoarthritis of knee: Secondary | ICD-10-CM

## 2021-12-18 DIAGNOSIS — I25110 Atherosclerotic heart disease of native coronary artery with unstable angina pectoris: Secondary | ICD-10-CM | POA: Diagnosis not present

## 2021-12-18 DIAGNOSIS — E039 Hypothyroidism, unspecified: Secondary | ICD-10-CM

## 2021-12-18 DIAGNOSIS — I152 Hypertension secondary to endocrine disorders: Secondary | ICD-10-CM

## 2021-12-18 DIAGNOSIS — Z23 Encounter for immunization: Secondary | ICD-10-CM

## 2021-12-18 DIAGNOSIS — E559 Vitamin D deficiency, unspecified: Secondary | ICD-10-CM

## 2021-12-18 DIAGNOSIS — F411 Generalized anxiety disorder: Secondary | ICD-10-CM

## 2021-12-18 DIAGNOSIS — E1159 Type 2 diabetes mellitus with other circulatory complications: Secondary | ICD-10-CM | POA: Diagnosis not present

## 2021-12-18 DIAGNOSIS — E1169 Type 2 diabetes mellitus with other specified complication: Secondary | ICD-10-CM | POA: Diagnosis not present

## 2021-12-18 DIAGNOSIS — I480 Paroxysmal atrial fibrillation: Secondary | ICD-10-CM | POA: Diagnosis not present

## 2021-12-18 DIAGNOSIS — M1990 Unspecified osteoarthritis, unspecified site: Secondary | ICD-10-CM

## 2021-12-18 DIAGNOSIS — E785 Hyperlipidemia, unspecified: Secondary | ICD-10-CM

## 2021-12-18 DIAGNOSIS — E6609 Other obesity due to excess calories: Secondary | ICD-10-CM

## 2021-12-18 DIAGNOSIS — E66811 Obesity, class 1: Secondary | ICD-10-CM

## 2021-12-18 DIAGNOSIS — I252 Old myocardial infarction: Secondary | ICD-10-CM

## 2021-12-18 DIAGNOSIS — Z6830 Body mass index (BMI) 30.0-30.9, adult: Secondary | ICD-10-CM

## 2021-12-18 DIAGNOSIS — M159 Polyosteoarthritis, unspecified: Secondary | ICD-10-CM

## 2021-12-18 LAB — BAYER DCA HB A1C WAIVED: HB A1C (BAYER DCA - WAIVED): 6.3 % — ABNORMAL HIGH (ref 4.8–5.6)

## 2021-12-18 MED ORDER — TRAMADOL HCL 50 MG PO TABS: 50.0000 mg | ORAL_TABLET | Freq: Three times a day (TID) | ORAL | 2 refills | Status: DC | PRN

## 2021-12-18 NOTE — Patient Instructions (Signed)

## 2021-12-18 NOTE — Progress Notes (Unsigned)
Cardiology Office Note   Date:  12/20/2021   ID:  Rhonda Garcia, DOB 24-Sep-1940, MRN [id number]  PCP:  Sharion Balloon, FNP  Cardiologist:   Minus Breeding, MD   Chief Complaint  Patient presents with   Coronary Artery Disease   Atrial Fibrillation      History of Present Illness: Rhonda Garcia is a 81 y.o. female who presents for evaluation of known coronary disease and atrial fibrillation.   Cath for chest pain in 2017 demonstrated nonobstructive disease in large vessels with an occluded PL off the RCA.  She had patent stents in the RI, RCA and LAD.  She was managed medically.  She was at [facility] ED in 2020 with chest pain. he ruled out for MI and she had a negative Lexiscan Myoview.       She had to have knee surgery since I last saw her.  She has had no new cardiovascular complaints.  She says she has a lot of stress in her life.  She denies any chest discomfort, neck or arm discomfort.  She has had no new palpitations, presyncope or syncope.  She had no weight gain or edema.  She really does not notice her atrial fibrillation.  I do note that she has had some mild progressive anemia but she is not reporting any GI bleeding symptoms.   Past Medical History:  Diagnosis Date   A-fib ([place])    Anal fissure    resolved    Anemia    Back pain    with left radiculopathy   CAD (coronary artery disease)    Cataract    Collagen vascular disease (HCC)    DDD (degenerative disc disease)    Depression    Diabetes mellitus    Diverticulosis    Dyslipidemia    Dysrhythmia    AFib   Esophagitis    GERD (gastroesophageal reflux disease)    Hiatal hernia    Hx of peptic ulcer    Hyperlipidemia    Hypertension    Hypothyroidism    Internal hemorrhoids 2017   NSTEMI (non-ST elevated myocardial infarction) ([place]) 08/05/2015   Osteopenia    Sleep apnea    not using CPAP now, could not tolerate and PCP aware.   Thyroid disease     Past Surgical History:  Procedure  Laterality Date   ABDOMINAL HYSTERECTOMY Bilateral 2001 - approximate   APPENDECTOMY     BREAST REDUCTION SURGERY     CARDIAC CATHETERIZATION N/A 08/08/2015   Procedure: Left Heart Cath and Coronary Angiography;  Surgeon: Burnell Blanks, MD;  Location: [place] [facility];  Service: Cardiovascular;  Laterality: N/A;   CHOLECYSTECTOMY  2008 - approximate   COLONOSCOPY  06/2007   Friable anal canal and anal papillae. Pancolonic diverticula. Normal terminal ileum. Status post segmental biopsy, descending colon biopsy showed minimal cryptitis. Biopsies felt to be  nonspecific but could be seen with NSAIDs. No features of inflammatory bowel disease. Stool studies were negative.   COLONOSCOPY  03/21/2011   RMR: colonic polyps treated as described above, colonic diverticulosis (Tubular adenomas)   COLONOSCOPY WITH PROPOFOL N/A 06/17/2014   RMR: Colonic diverticulosis. Multiple colonic polyps removed as described above.    COLONOSCOPY WITH PROPOFOL N/A 03/19/2016   Procedure: COLONOSCOPY WITH PROPOFOL;  Surgeon: Daneil Dolin, MD;  [facility];  Service: Endoscopy;  Laterality: N/A;  8:45am   COLONOSCOPY WITH PROPOFOL N/A 10/02/2018   Procedure: COLONOSCOPY WITH  PROPOFOL;  Surgeon: Daneil Dolin, MD;  [facility];  Service: Endoscopy;  Laterality: N/A;  9:15am   CORONARY ANGIOPLASTY WITH STENT PLACEMENT     4 stents   ESOPHAGEAL DILATION N/A 06/17/2014   Procedure: ESOPHAGEAL DILATION;  Surgeon: Daneil Dolin, MD;  Location: [facility];  Service: Endoscopy;  Laterality: N/A;  Maloney 23 Fr   ESOPHAGOGASTRODUODENOSCOPY  06/2007   Small hiatal hernia   ESOPHAGOGASTRODUODENOSCOPY  03/21/2011   RMR: normal esophagus- status post passage of Maloney dilator. Small hiatal hernia. Trivial antral and bulbar erosions   ESOPHAGOGASTRODUODENOSCOPY (EGD) WITH PROPOFOL N/A 06/17/2014   RMR: Small hiatal hernia; otherwise normal EGD. Status post passage of a Maloney dilator. No explanation  for patients right upper quadrant abdominal pain.    ESOPHAGOGASTRODUODENOSCOPY (EGD) WITH PROPOFOL N/A 03/19/2016   Procedure: ESOPHAGOGASTRODUODENOSCOPY (EGD) WITH PROPOFOL;  Surgeon: Daneil Dolin, MD;  [facility];  Service: Endoscopy;  Laterality: N/A;   KNEE SURGERY Left    arthroscopy   LIPOMA EXCISION  03/17/2012   Procedure: EXCISION LIPOMA;  Surgeon: Gwenyth Ober, MD;  Location: [place];  Service: General;  Laterality: Right;  excision of right lower back lipoma   MALONEY DILATION N/A 03/19/2016   Procedure: Venia Minks DILATION;  Surgeon: Daneil Dolin, MD;  [facility];  Service: Endoscopy;  Laterality: N/A;   POLYPECTOMY  06/17/2014   Procedure: POLYPECTOMY;  Surgeon: Daneil Dolin, MD;  [facility];  Service: Endoscopy;;   POLYPECTOMY  10/02/2018   Procedure: POLYPECTOMY;  Surgeon: Daneil Dolin, MD;  [facility];  Service: Endoscopy;;  colon   REDUCTION MAMMAPLASTY Bilateral      Current Outpatient Medications  Medication Sig Dispense Refill   ALPRAZolam (XANAX) 0.5 MG tablet Take 0.5 mg by mouth daily as needed.     apixaban (ELIQUIS) 5 MG TABS tablet TAKE ONE TABLET BY MOUTH TWICE DAILY 60 tablet 5   busPIRone (BUSPAR) 5 MG tablet TAKE ONE (1) TABLET THREE (3) TIMES EACH DAY 90 tablet 1   escitalopram (LEXAPRO) 10 MG tablet Take 1 tablet (10 mg total) by mouth daily. 90 tablet 3   hydroxychloroquine (PLAQUENIL) 200 MG tablet TAKE ONE (1) TABLET BY MOUTH EVERY DAY 30 tablet 1   isosorbide mononitrate (IMDUR) 60 MG 24 hr tablet TAKE ONE (1) TABLET EACH DAY 90 tablet 1   levothyroxine (SYNTHROID) 50 MCG tablet TAKE ONE (1) TABLET EACH DAY 90 tablet 3   linaclotide (LINZESS) 72 MCG capsule Take 1 capsule (72 mcg total) by mouth daily before breakfast. 90 capsule 1   lisinopril (ZESTRIL) 20 MG tablet Take 1 tablet (20 mg total) by mouth daily. 90 tablet 3   metoprolol tartrate (LOPRESSOR) 50 MG tablet TAKE ONE TABLET BY  MOUTH TWICE DAILY 180 tablet 0   nitroGLYCERIN (NITROSTAT) 0.4 MG SL tablet DISSOLVE 1 TAB UNDER TOUNGE FOR CHEST PAIN. MAY REPEAT EVERY 5 MINUTES FOR 3 DOSES. IF NO RELIEF CALL 911 OR GO TO ER 25 tablet 1   ondansetron (ZOFRAN) 4 MG tablet Take 1 tablet (4 mg total) by mouth every 8 (eight) hours as needed. 30 tablet 2   pantoprazole (PROTONIX) 40 MG tablet TAKE ONE TABLET BY MOUTH TWICE DAILY 180 tablet 0   pravastatin (PRAVACHOL) 80 MG tablet TAKE 1 TABLET DAILY IN THE EVENING 90 tablet 0   traMADol (ULTRAM) 50 MG tablet Take 1 tablet (50 mg total) by mouth every 8 (eight)  hours as needed. 20 tablet 2   blood glucose meter kit and supplies Dispense based on patient and insurance preference. Use up to four times daily as directed. (FOR ICD-10 E10.9, E11.9). 1 each 0   glucose blood (ONETOUCH VERIO) test strip Test BS QID Dx E11.9 400 each 3   Lancets (ONETOUCH DELICA PLUS [id number]) MISC Test BS QID Dx E11.9 400 each 3   metFORMIN (GLUCOPHAGE) 1000 MG tablet Take 1 tablet (1,000 mg total) by mouth 2 (two) times daily with a meal. (Patient not taking: Reported on 12/20/2021) 180 tablet 0   No current facility-administered medications for this visit.    Allergies:   Fentanyl and Promethazine hcl   ROS:  Please see the history of present illness.   Otherwise, review of systems are positive for none.   All other systems are reviewed and negative.    PHYSICAL EXAM: VS:  BP 120/68   Pulse 83   Ht _0  (1.575 m)   Wt 165 lb (74.8 kg)   BMI 30.18 kg/m  , BMI Body mass index is 30.18 kg/m. GENERAL:  Well appearing NECK:  No jugular venous distention, waveform within normal limits, carotid upstroke brisk and symmetric, no bruits, no thyromegaly LUNGS:  Clear to auscultation bilaterally CHEST:  Unremarkable HEART:  PMI not displaced or sustained,S1 and S2 within normal limits, no S3, no clicks, no rubs, no murmurs, irregular ABD:  Flat, positive bowel sounds normal in frequency in pitch, no  bruits, no rebound, no guarding, no midline pulsatile mass, no hepatomegaly, no splenomegaly EXT:  2 plus pulses throughout, no edema, no cyanosis no clubbing   EKG:  EKG is  ordered today. The ekg ordered today demonstrates sinus rhythm, rate 83, axis within normal limits, intervals within normal limits, low voltage.  No acute ST-T wave changes.   Recent Labs: 06/13/2021: TSH 1.200 12/18/2021: ALT 9; BUN 11; Creatinine, Ser 0.91; Hemoglobin 10.6; Platelets 117; Potassium 4.4; Sodium 146    Lipid Panel    Component Value Date/Time   CHOL 118 10/22/2019 0934   CHOL 181 10/28/2012 1208   TRIG 86 10/22/2019 0934   TRIG 67 08/13/2013 1154   TRIG 97 10/28/2012 1208   HDL 42 10/22/2019 0934   HDL 43 08/13/2013 1154   HDL 50 10/28/2012 1208   CHOLHDL 2.8 10/22/2019 0934   CHOLHDL 3.8 08/06/2015 0200   VLDL 21 08/06/2015 0200   LDLCALC 59 10/22/2019 0934   LDLCALC 58 08/13/2013 1154   LDLCALC 112 (H) 10/28/2012 1208      Wt Readings from Last 3 Encounters:  12/20/21 165 lb (74.8 kg)  12/18/21 168 lb (76.2 kg)  09/14/21 165 lb (74.8 kg)      Other studies Reviewed: Additional studies/ records that were reviewed today include: Orthopedic records. Review of the above records demonstrates:  Please see elsewhere in the note.     ASSESSMENT AND PLAN:  ATRIAL FIB  Rhonda Garcia has a CHA2DS2 - VASc score of 5.   She does have some mild anemia but no evidence of GI bleeding by her report.  Her labs were just drawn yesterday by Sharion Balloon, FNP and I will defer further work-up and management.  At this point I do not think we need to stop anticoagulation.   I did send a message to her primary care suggesting that if they thought she needed to hold her anticoagulation at some point for any GI evaluation of progressive anemia feel free  to do that.  CAD, NATIVE VESSEL  She had a low risk Lexiscan Myoview in 2022.      She had no new symptoms.  No change in therapy.     HYPERTENSION, UNSPECIFIED The blood pressure is at target.  No change in therapy.   HYPERLIPIDEMIA LDL was 59 with an HDL of 42.  No change in therapy.     Current medicines are reviewed at length with the patient today.  The patient does not have concerns regarding medicines.  The following changes have been made:   None   Labs/ tests ordered today include:   None   Orders Placed This Encounter  Procedures   EKG 12-Lead     Disposition:   FU with me in 12 months   Signed, Minus Breeding, MD  12/20/2021 3:31 PM    [place]

## 2021-12-18 NOTE — Progress Notes (Signed)
 Subjective:    Patient ID: Rhonda Garcia, female    DOB: 03/31/1941, 81 y.o.   MRN: [id number]  Chief Complaint  Patient presents with   Medical Management of Chronic Issues   PT presents to the office today for chronic follow up. She is followed by Cardiologists annually  for A Fib, hx NSTEMI, and CAD. She is followed by GI as needed for constipation, GERD.   She had elevated rheumatoid arthritis and saw a rheumatologists. She states her pain is a 8 out 10.  Hypertension This is a chronic problem. The current episode started more than 1 year ago. The problem has been waxing and waning since onset. The problem is uncontrolled. Associated symptoms include anxiety and malaise/fatigue. Pertinent negatives include no blurred vision, peripheral edema or shortness of breath. Risk factors for coronary artery disease include dyslipidemia, diabetes mellitus, obesity and sedentary lifestyle. The current treatment provides moderate improvement. Identifiable causes of hypertension include a thyroid problem.  Gastroesophageal Reflux She complains of belching and heartburn. She reports no hoarse voice. This is a chronic problem. The current episode started more than 1 year ago. The problem occurs occasionally. Associated symptoms include fatigue. Risk factors include obesity. She has tried a PPI for the symptoms. The treatment provided moderate relief.  Thyroid Problem Presents for follow-up visit. Symptoms include anxiety, constipation, depressed mood and fatigue. Patient reports no hoarse voice. The symptoms have been stable. Her past medical history is significant for hyperlipidemia.  Hyperlipidemia This is a chronic problem. The current episode started more than 1 year ago. The problem is controlled. Recent lipid tests were reviewed and are normal. Exacerbating diseases include obesity. Pertinent negatives include no shortness of breath. Current antihyperlipidemic treatment includes statins. The  current treatment provides moderate improvement of lipids. Risk factors for coronary artery disease include dyslipidemia, diabetes mellitus, hypertension, a sedentary lifestyle and post-menopausal.  Diabetes She presents for her follow-up diabetic visit. She has type 2 diabetes mellitus. Hypoglycemia symptoms include nervousness/anxiousness. Associated symptoms include fatigue and foot paresthesias. Pertinent negatives for diabetes include no blurred vision. Diabetic complications include heart disease. Risk factors for coronary artery disease include dyslipidemia, diabetes mellitus, hypertension, sedentary lifestyle and post-menopausal. She is following a generally unhealthy diet. (Not checking BS at home)  Arthritis Presents for follow-up visit. She complains of pain and stiffness. The symptoms have been stable. Affected locations include the left knee, right knee, left MCP and right MCP. Her pain is at a severity of 8/10. Associated symptoms include fatigue.  Anxiety Presents for follow-up visit. Symptoms include depressed mood, excessive worry, irritability, nervous/anxious behavior and restlessness. Patient reports no shortness of breath. Symptoms occur occasionally. The severity of symptoms is moderate.   Her past medical history is significant for anemia.  Constipation This is a chronic problem. The current episode started more than 1 year ago. The problem has been waxing and waning since onset. She has tried laxatives for the symptoms. The treatment provided moderate relief.  Depression        This is a chronic problem.  The current episode started more than 1 year ago.   The onset quality is sudden.   Associated symptoms include fatigue and restlessness.  Associated symptoms include no helplessness, no hopelessness and not sad.  Past treatments include SSRIs - Selective serotonin reuptake inhibitors.  Past medical history includes thyroid problem and anxiety.   Anemia Presents for follow-up  visit. Symptoms include malaise/fatigue.      Review of Systems  Constitutional:    Positive for fatigue, irritability and malaise/fatigue.  HENT:  Negative for hoarse voice.   Eyes:  Negative for blurred vision.  Respiratory:  Negative for shortness of breath.   Gastrointestinal:  Positive for constipation and heartburn.  Musculoskeletal:  Positive for arthritis and stiffness.  Psychiatric/Behavioral:  Positive for depression. The patient is nervous/anxious.   All other systems reviewed and are negative.  Family History  Problem Relation Age of Onset   Heart attack Mother    Hypertension Mother    Stroke Mother        Deceased, age 97   Diabetes Sister    Arthritis Sister    Diabetes Sister    Cancer Sister        ovarian   Stroke Sister    Alzheimer's disease Sister    Diabetes Brother        also has a blood disorder    Clotting disorder Brother    Kidney disease Brother    Heart disease Brother    Melanoma Brother        deceased   Diabetes Son    Hypertension Son    Coronary artery disease Son 87       CABG   Stroke Son    Hypertension Son    Coronary artery disease Son 11       CABG   Colon cancer Neg Hx   . Social History   Socioeconomic History   Marital status: Married    Spouse name: Not on file   Number of children: 4   Years of education: 6   Highest education level: 6th grade  Occupational History   Occupation: Retired    Comment: Charity fundraiser  Tobacco Use   Smoking status: Never   Smokeless tobacco: Never  Scientific laboratory technician Use: Never used  Substance and Sexual Activity   Alcohol use: Not Currently    Alcohol/week: 1.0 standard drink of alcohol    Types: 1 Glasses of wine per week   Drug use: No   Sexual activity: Not Currently    Birth control/protection: Post-menopausal  Other Topics Concern   Not on file  Social History Narrative    Has 4 adult children. She lives home with one of her sons who recently had a stroke and needs 24  hour care. She is the primary caregiver and is now legally separated from her husband.    Social Determinants of Health   Financial Resource Strain: Low Risk  (05/21/2019)   Overall Financial Resource Strain (CARDIA)    Difficulty of Paying Living Expenses: Not hard at all  Food Insecurity: No Food Insecurity (05/21/2019)   Hunger Vital Sign    Worried About Running Out of Food in the Last Year: Never true    Ran Out of Food in the Last Year: Never true  Transportation Needs: No Transportation Needs (05/21/2019)   PRAPARE - Hydrologist (Medical): No    Lack of Transportation (Non-Medical): No  Physical Activity: Insufficiently Active (05/21/2019)   Exercise Vital Sign    Days of Exercise per Week: 7 days    Minutes of Exercise per Session: 10 min  Stress: Stress Concern Present (05/21/2019)   [place]    Feeling of Stress : Rather much  Social Connections: Moderately Integrated (05/21/2019)   Social Connection and Isolation Panel [NHANES]    Frequency of Communication with Friends and Family: More than three  times a week    Frequency of Social Gatherings with Friends and Family: More than three times a week    Attends Religious Services: More than 4 times per year    Active Member of [employer] or Organizations: Yes    Attends Music therapist: More than 4 times per year    Marital Status: Separated     Objective:   Physical Exam Vitals reviewed.  Constitutional:      General: She is not in acute distress.    Appearance: She is well-developed. She is obese.  HENT:     Head: Normocephalic and atraumatic.     Right Ear: Tympanic membrane normal.     Left Ear: Tympanic membrane normal.  Eyes:     Pupils: Pupils are equal, round, and reactive to light.  Neck:     Thyroid: No thyromegaly.  Cardiovascular:     Rate and Rhythm: Normal rate and regular rhythm.     Heart sounds:  Normal heart sounds. No murmur heard. Pulmonary:     Effort: Pulmonary effort is normal. No respiratory distress.     Breath sounds: Normal breath sounds. No wheezing.  Abdominal:     General: Bowel sounds are normal. There is no distension.     Palpations: Abdomen is soft.     Tenderness: There is no abdominal tenderness.  Musculoskeletal:        General: No tenderness. Normal range of motion.     Cervical back: Normal range of motion and neck supple.  Skin:    General: Skin is warm and dry.  Neurological:     Mental Status: She is alert and oriented to person, place, and time.     Cranial Nerves: No cranial nerve deficit.     Deep Tendon Reflexes: Reflexes are normal and symmetric.  Psychiatric:        Behavior: Behavior normal.        Thought Content: Thought content normal.        Judgment: Judgment normal.       BP 132/60   Pulse 72   Temp (!) 97.1 F (36.2 C) (Oral)   Resp 20   Ht 5' 2" (1.575 m)   Wt 168 lb (76.2 kg)   SpO2 97%   BMI 30.73 kg/m      Assessment & Plan:   DELORIS MOGER comes in today with chief complaint of Medical Management of Chronic Issues   Diagnosis and orders addressed:  1. Hypertension associated with diabetes ([place]) - CMP14+EGFR - CBC with Differential/Platelet  2. Paroxysmal atrial fibrillation (HCC) - CMP14+EGFR - CBC with Differential/Platelet  3. Coronary artery disease involving native coronary artery of native heart with unstable angina pectoris (HCC) - CMP14+EGFR - CBC with Differential/Platelet  4. Gastroesophageal reflux disease without esophagitis - CMP14+EGFR - CBC with Differential/Platelet  5. Type 2 diabetes mellitus with other specified complication, without long-term current use of insulin (HCC)  - Bayer DCA Hb A1c Waived - CMP14+EGFR - CBC with Differential/Platelet  6. Hyperlipidemia associated with type 2 diabetes mellitus (HCC) - CMP14+EGFR - CBC with Differential/Platelet  7.  Hypothyroidism, unspecified type - CMP14+EGFR - CBC with Differential/Platelet  8. Bilateral primary osteoarthritis of knee - CMP14+EGFR - CBC with Differential/Platelet  9. Generalized osteoarthritis of multiple sites  - CMP14+EGFR - CBC with Differential/Platelet  10. Osteoarthritis, unspecified osteoarthritis type, unspecified site - traMADol (ULTRAM) 50 MG tablet; Take 1 tablet (50 mg total) by mouth every 8 (eight) hours as needed.  Dispense: 20 tablet; Refill: 2 - CMP14+EGFR - CBC with Differential/Platelet  11. Hx of non-ST elevation myocardial infarction (NSTEMI)  - CMP14+EGFR - CBC with Differential/Platelet  12. GAD (generalized anxiety disorder) - CMP14+EGFR - CBC with Differential/Platelet  13. Vitamin D deficiency - CMP14+EGFR - CBC with Differential/Platelet  14. Moderate episode of recurrent major depressive disorder (HCC) - CMP14+EGFR - CBC with Differential/Platelet  15. Class 1 obesity due to excess calories with serious comorbidity and body mass index (BMI) of 30.0 to 30.9 in adult  - CMP14+EGFR - CBC with Differential/Platelet   Labs pending Patient reviewed in [place] controlled database, no flags noted. Contract and drug screen are up to date.  Health Maintenance reviewed Diet and exercise encouraged  Follow up plan: 3 months    Christy Hawks, FNP   

## 2021-12-19 LAB — CBC WITH DIFFERENTIAL/PLATELET
Basophils Absolute: 0.1 10*3/uL (ref 0.0–0.2)
Basos: 2 %
EOS (ABSOLUTE): 0.2 10*3/uL (ref 0.0–0.4)
Eos: 3 %
Hematocrit: 32.8 % — ABNORMAL LOW (ref 34.0–46.6)
Hemoglobin: 10.6 g/dL — ABNORMAL LOW (ref 11.1–15.9)
Immature Grans (Abs): 0 10*3/uL (ref 0.0–0.1)
Immature Granulocytes: 0 %
Lymphocytes Absolute: 1.2 10*3/uL (ref 0.7–3.1)
Lymphs: 25 %
MCH: 30.2 pg (ref 26.6–33.0)
MCHC: 32.3 g/dL (ref 31.5–35.7)
MCV: 93 fL (ref 79–97)
Monocytes Absolute: 0.4 10*3/uL (ref 0.1–0.9)
Monocytes: 9 %
Neutrophils Absolute: 2.9 10*3/uL (ref 1.4–7.0)
Neutrophils: 61 %
Platelets: 117 10*3/uL — ABNORMAL LOW (ref 150–450)
RBC: 3.51 x10E6/uL — ABNORMAL LOW (ref 3.77–5.28)
RDW: 13.1 % (ref 11.7–15.4)
WBC: 4.7 10*3/uL (ref 3.4–10.8)

## 2021-12-19 LAB — CMP14+EGFR
ALT: 9 IU/L (ref 0–32)
AST: 15 IU/L (ref 0–40)
Albumin/Globulin Ratio: 2 (ref 1.2–2.2)
Albumin: 3.9 g/dL (ref 3.8–4.8)
Alkaline Phosphatase: 87 IU/L (ref 44–121)
BUN/Creatinine Ratio: 12 (ref 12–28)
BUN: 11 mg/dL (ref 8–27)
Bilirubin Total: 0.8 mg/dL (ref 0.0–1.2)
CO2: 26 mmol/L (ref 20–29)
Calcium: 8.8 mg/dL (ref 8.7–10.3)
Chloride: 110 mmol/L — ABNORMAL HIGH (ref 96–106)
Creatinine, Ser: 0.91 mg/dL (ref 0.57–1.00)
Globulin, Total: 2 g/dL (ref 1.5–4.5)
Glucose: 150 mg/dL — ABNORMAL HIGH (ref 70–99)
Potassium: 4.4 mmol/L (ref 3.5–5.2)
Sodium: 146 mmol/L — ABNORMAL HIGH (ref 134–144)
Total Protein: 5.9 g/dL — ABNORMAL LOW (ref 6.0–8.5)
eGFR: 64 mL/min/{1.73_m2} (ref >59)

## 2021-12-20 ENCOUNTER — Ambulatory Visit (INDEPENDENT_AMBULATORY_CARE_PROVIDER_SITE_OTHER): Payer: Medicare Other | Admitting: Cardiology

## 2021-12-20 ENCOUNTER — Encounter: Payer: Self-pay | Admitting: Cardiology

## 2021-12-20 VITALS — BP 120/68 | HR 83 | Ht 62.0 in | Wt 165.0 lb

## 2021-12-20 DIAGNOSIS — I480 Paroxysmal atrial fibrillation: Secondary | ICD-10-CM

## 2021-12-20 DIAGNOSIS — I2510 Atherosclerotic heart disease of native coronary artery without angina pectoris: Secondary | ICD-10-CM | POA: Diagnosis not present

## 2021-12-20 DIAGNOSIS — I10 Essential (primary) hypertension: Secondary | ICD-10-CM

## 2021-12-20 NOTE — Patient Instructions (Signed)
Medication Instructions:  The current medical regimen is effective;  continue present plan and medications.  *If you need a refill on your cardiac medications before your next appointment, please call your pharmacy*  Follow-Up: At [facility], you and your health needs are our priority.  As part of our continuing mission to provide you with exceptional heart care, we have created designated Provider Care Teams.  These Care Teams include your primary Cardiologist (physician) and Advanced Practice Providers (APPs -  Physician Assistants and Nurse Practitioners) who all work together to provide you with the care you need, when you need it.  We recommend signing up for the patient portal called "MyChart".  Sign up information is provided on this After Visit Summary.  MyChart is used to connect with patients for Virtual Visits (Telemedicine).  Patients are able to view lab/test results, encounter notes, upcoming appointments, etc.  Non-urgent messages can be sent to your provider as well.   To learn more about what you can do with MyChart, go to [email or web address].    Your next appointment:   1 year(s)  The format for your next appointment:   In Person  Provider:   James Hochrein, MD     Important Information About Sugar       

## 2022-01-10 ENCOUNTER — Other Ambulatory Visit: Payer: Self-pay | Admitting: Family

## 2022-01-10 ENCOUNTER — Telehealth: Payer: Self-pay | Admitting: Family

## 2022-01-10 DIAGNOSIS — E1169 Type 2 diabetes mellitus with other specified complication: Secondary | ICD-10-CM

## 2022-01-10 NOTE — Telephone Encounter (Signed)
Patient states in the last week her BS has been running 150-200.

## 2022-01-11 MED ORDER — EMPAGLIFLOZIN 10 MG PO TABS: 10.0000 mg | ORAL_TABLET | Freq: Every day | ORAL | 1 refills | Status: DC

## 2022-01-11 NOTE — Telephone Encounter (Signed)
Continue the metformin 1000 mg BID and I have added Jardiance 10 mg. Please make sure she has a follow up with me scheduled in the next 2 months.

## 2022-01-11 NOTE — Telephone Encounter (Signed)
Patient notified

## 2022-01-11 NOTE — Addendum Note (Signed)
Addended by: Evelina Dun A on: 01/11/2022 09:09 AM   Modules accepted: Orders

## 2022-02-02 ENCOUNTER — Encounter: Payer: Self-pay | Admitting: Nurse Practitioner

## 2022-02-02 ENCOUNTER — Ambulatory Visit (INDEPENDENT_AMBULATORY_CARE_PROVIDER_SITE_OTHER): Payer: Medicare Other | Admitting: Nurse Practitioner

## 2022-02-02 ENCOUNTER — Telehealth: Payer: Self-pay | Admitting: Family

## 2022-02-02 DIAGNOSIS — Z20822 Contact with and (suspected) exposure to covid-19: Secondary | ICD-10-CM

## 2022-02-02 MED ORDER — MOLNUPIRAVIR EUA 200MG CAPSULE: 4.0000 | ORAL_CAPSULE | Freq: Two times a day (BID) | ORAL | 0 refills | Status: AC

## 2022-02-02 MED ORDER — MOLNUPIRAVIR EUA 200MG CAPSULE: 4.0000 | ORAL_CAPSULE | Freq: Two times a day (BID) | ORAL | 0 refills | Status: DC

## 2022-02-02 NOTE — Telephone Encounter (Signed)
Changed and switched to [facility]

## 2022-02-02 NOTE — Progress Notes (Addendum)
   Virtual Visit  Note Due to COVID-19 pandemic this visit was conducted virtually. This visit type was conducted due to national recommendations for restrictions regarding the COVID-19 Pandemic (e.g. social distancing, sheltering in place) in an effort to limit this patient's exposure and mitigate transmission in our community. All issues noted in this document were discussed and addressed.  A physical exam was not performed with this format.  I connected with Rhonda Garcia on 02/02/22 at 10 AM by telephone and verified that I am speaking with the correct person using two identifiers. Rhonda Garcia is currently located at home with spouse during visit. The provider, Ivy Lynn, NP is located in their note office at time of visit.  I discussed the limitations, risks, security and privacy concerns of performing an evaluation and management service by telephone and the availability of in person appointments. I also discussed with the patient that there may be a patient responsible charge related to this service. The patient expressed understanding and agreed to proceed.   History and Present Illness:  URI  This is a new problem. The current episode started yesterday. The problem has been unchanged. There has been no fever. Associated symptoms include congestion, coughing and headaches. Pertinent negatives include no abdominal pain, sore throat or swollen glands. She has tried nothing for the symptoms.      Review of Systems  Constitutional:  Positive for malaise/fatigue. Negative for chills and fever.  HENT:  Positive for congestion. Negative for sore throat.   Respiratory:  Positive for cough.   Gastrointestinal:  Negative for abdominal pain.  Skin: Negative.   Neurological:  Positive for headaches.  All other systems reviewed and are negative.    Observations/Objective: Televisit patient not in distress.  Assessment and Plan: Patient presenting with congestion, cough,  chills, malaise and fatigue, exposed to positive COVID-19 from a friend. Provided education to patient to take meds as prescribed -Rx antiviral sent to pharmacy education provided over the pone, patient verbalize understanding. - Use a cool mist humidifier  -Use saline nose sprays frequently -Force fluids -For fever or aches or pains- take Tylenol or ibuprofen. -If symptoms do not improve, she may need to be COVID tested to rule this out  Follow Up Instructions: Follow-up with unresolved symptoms.    I discussed the assessment and treatment plan with the patient. The patient was provided an opportunity to ask questions and all were answered. The patient agreed with the plan and demonstrated an understanding of the instructions.   The patient was advised to call back or seek an in-person evaluation if the symptoms worsen or if the condition fails to improve as anticipated.  The above assessment and management plan was discussed with the patient. The patient verbalized understanding of and has agreed to the management plan. Patient is aware to call the clinic if symptoms persist or worsen. Patient is aware when to return to the clinic for a follow-up visit. Patient educated on when it is appropriate to go to the emergency department.   Time call ended: 10:39 AM.  I provided 9 minutes of  non face-to-face time during this encounter.    Ivy Lynn, NP

## 2022-02-12 ENCOUNTER — Other Ambulatory Visit: Payer: Self-pay | Admitting: Family

## 2022-02-12 DIAGNOSIS — K219 Gastro-esophageal reflux disease without esophagitis: Secondary | ICD-10-CM

## 2022-03-05 ENCOUNTER — Other Ambulatory Visit: Payer: Self-pay | Admitting: Gastroenterology

## 2022-03-15 ENCOUNTER — Other Ambulatory Visit: Payer: Self-pay | Admitting: Family

## 2022-03-16 ENCOUNTER — Telehealth: Payer: Self-pay | Admitting: Pharmacist

## 2022-03-16 NOTE — Progress Notes (Signed)
[place])  [place] Team    03/16/2022  Rhonda Garcia [birth year] [id number]  Reason for referral: Medication Adherence  Referral source:  Payer Report Current insurance: [employer] [facility]  Outreach:  Successful telephone call with patient.  HIPAA identifiers verified.   Subjective:  Patient was called because she was on the BCBS adherence report for Jardiance.  After speaking with her, she had the Jardiance filled on 03/15/22 but expressed that it was a hardship due to the cost of it along with Eliquis and Linzess.  Since the 2023 patient assistance season has closed, patient was assessed for patient assistance for 2024.    Objective: The ASCVD Risk score (Arnett DK, et al., 2019) failed to calculate for the following reasons:   The 2019 ASCVD risk score is only valid for ages 40 to 43   The patient has a prior MI or stroke diagnosis  Lab Results  Component Value Date   CREATININE 0.91 12/18/2021   CREATININE 0.94 09/14/2021   CREATININE 0.86 06/15/2021    Lab Results  Component Value Date   HGBA1C 6.3 (H) 12/18/2021    Lipid Panel     Component Value Date/Time   CHOL 118 10/22/2019 0934   CHOL 181 10/28/2012 1208   TRIG 86 10/22/2019 0934   TRIG 67 08/13/2013 1154   TRIG 97 10/28/2012 1208   HDL 42 10/22/2019 0934   HDL 43 08/13/2013 1154   HDL 50 10/28/2012 1208   CHOLHDL 2.8 10/22/2019 0934   CHOLHDL 3.8 08/06/2015 0200   VLDL 21 08/06/2015 0200   LDLCALC 59 10/22/2019 0934   LDLCALC 58 08/13/2013 1154   LDLCALC 112 (H) 10/28/2012 1208    BP Readings from Last 3 Encounters:  12/20/21 120/68  12/18/21 132/60  09/14/21 113/67    Allergies  Allergen Reactions   Fentanyl Anaphylaxis   Promethazine Hcl Anaphylaxis      Medication Assistance Findings:  Medication assistance needs identified: Linzess, Jardiance, and Eliquis.Based on patient's reported income, she should qualify to receive all three  medications through patient assistance programs.  However, [employer] require's patient's to spend at least 3% of their income in out-of-pocket medication expenses before they can qualify. As such, patient will be sent applications for Linzess and Jardiance. She communicated understanding about the necessary financial paperwork.  Extra Help:  Not eligible for Extra Help Low Income Subsidy based on reported income and assets   Plan: I will route patient assistance letter to [place] technician who will coordinate patient assistance program application process for medications listed above.  [facility] pharmacy technician will assist with obtaining all required documents from both patient and provider(s) and submit application(s) once completed.    Elayne Guerin, PharmD, [place] Clinical Pharmacist [phone]

## 2022-03-19 ENCOUNTER — Ambulatory Visit: Payer: Medicare Other | Admitting: Family

## 2022-03-19 ENCOUNTER — Encounter: Payer: Self-pay | Admitting: Family

## 2022-03-20 ENCOUNTER — Ambulatory Visit (INDEPENDENT_AMBULATORY_CARE_PROVIDER_SITE_OTHER): Payer: Medicare Other | Admitting: Family

## 2022-03-20 ENCOUNTER — Telehealth: Payer: Self-pay | Admitting: Pharmacy Technician

## 2022-03-20 ENCOUNTER — Encounter: Payer: Self-pay | Admitting: Family

## 2022-03-20 VITALS — BP 144/61 | HR 63 | Temp 97.8°F | Ht 62.0 in | Wt 162.0 lb

## 2022-03-20 DIAGNOSIS — R300 Dysuria: Secondary | ICD-10-CM

## 2022-03-20 DIAGNOSIS — E559 Vitamin D deficiency, unspecified: Secondary | ICD-10-CM | POA: Diagnosis not present

## 2022-03-20 DIAGNOSIS — E1159 Type 2 diabetes mellitus with other circulatory complications: Secondary | ICD-10-CM

## 2022-03-20 DIAGNOSIS — E1169 Type 2 diabetes mellitus with other specified complication: Secondary | ICD-10-CM

## 2022-03-20 DIAGNOSIS — Z596 Low income: Secondary | ICD-10-CM

## 2022-03-20 DIAGNOSIS — I480 Paroxysmal atrial fibrillation: Secondary | ICD-10-CM | POA: Diagnosis not present

## 2022-03-20 DIAGNOSIS — N3000 Acute cystitis without hematuria: Secondary | ICD-10-CM

## 2022-03-20 DIAGNOSIS — Z23 Encounter for immunization: Secondary | ICD-10-CM | POA: Diagnosis not present

## 2022-03-20 DIAGNOSIS — M1990 Unspecified osteoarthritis, unspecified site: Secondary | ICD-10-CM | POA: Diagnosis not present

## 2022-03-20 DIAGNOSIS — I152 Hypertension secondary to endocrine disorders: Secondary | ICD-10-CM

## 2022-03-20 DIAGNOSIS — F331 Major depressive disorder, recurrent, moderate: Secondary | ICD-10-CM

## 2022-03-20 DIAGNOSIS — F411 Generalized anxiety disorder: Secondary | ICD-10-CM

## 2022-03-20 DIAGNOSIS — K219 Gastro-esophageal reflux disease without esophagitis: Secondary | ICD-10-CM

## 2022-03-20 DIAGNOSIS — K5900 Constipation, unspecified: Secondary | ICD-10-CM

## 2022-03-20 DIAGNOSIS — M159 Polyosteoarthritis, unspecified: Secondary | ICD-10-CM

## 2022-03-20 DIAGNOSIS — E039 Hypothyroidism, unspecified: Secondary | ICD-10-CM

## 2022-03-20 DIAGNOSIS — E785 Hyperlipidemia, unspecified: Secondary | ICD-10-CM

## 2022-03-20 DIAGNOSIS — K5730 Diverticulosis of large intestine without perforation or abscess without bleeding: Secondary | ICD-10-CM

## 2022-03-20 DIAGNOSIS — I252 Old myocardial infarction: Secondary | ICD-10-CM

## 2022-03-20 LAB — MICROSCOPIC EXAMINATION

## 2022-03-20 LAB — URINALYSIS, COMPLETE
Bilirubin, UA: NEGATIVE
Ketones, UA: NEGATIVE
Leukocytes,UA: NEGATIVE
Nitrite, UA: POSITIVE — AB
Protein,UA: NEGATIVE
Specific Gravity, UA: 1.01 (ref 1.005–1.030)
Urobilinogen, Ur: 0.2 mg/dL (ref 0.2–1.0)
pH, UA: 5 (ref 5.0–7.5)

## 2022-03-20 LAB — BAYER DCA HB A1C WAIVED: HB A1C (BAYER DCA - WAIVED): 7.7 % — ABNORMAL HIGH (ref 4.8–5.6)

## 2022-03-20 MED ORDER — CEPHALEXIN 500 MG PO CAPS: 500.0000 mg | ORAL_CAPSULE | Freq: Two times a day (BID) | ORAL | 0 refills | Status: DC

## 2022-03-20 MED ORDER — ALPRAZOLAM 0.5 MG PO TABS: 0.5000 mg | ORAL_TABLET | Freq: Every day | ORAL | 2 refills | Status: DC | PRN

## 2022-03-20 MED ORDER — TRAMADOL HCL 50 MG PO TABS: 50.0000 mg | ORAL_TABLET | Freq: Three times a day (TID) | ORAL | 2 refills | Status: DC | PRN

## 2022-03-20 MED ORDER — CLOBETASOL PROPIONATE 0.05 % EX CREA: 1.0000 | TOPICAL_CREAM | Freq: Two times a day (BID) | CUTANEOUS | 0 refills | Status: DC

## 2022-03-20 MED ORDER — ISOSORBIDE MONONITRATE ER 60 MG PO TB24: ORAL_TABLET | ORAL | 1 refills | Status: DC

## 2022-03-20 NOTE — Progress Notes (Signed)
Subjective:    Patient ID: Rhonda Garcia, female    DOB: 1941/02/08, 81 y.o.   MRN: [id number]  Chief Complaint  Patient presents with   Medical Management of Chronic Issues   Dysuria   PT presents to the office today for chronic follow up. She is followed by Cardiologists annually  for A Fib, hx NSTEMI, and CAD. She is followed by GI as needed for constipation, GERD.   She had elevated rheumatoid arthritis and saw a rheumatologists. She states her pain is a 3 out 10.  Dysuria  This is a new problem. The current episode started in the past 7 days. The problem occurs every urination. The problem has been waxing and waning. The quality of the pain is described as burning. The pain is mild. Pertinent negatives include no hematuria. Associated symptoms comments: The skin is raw .  Hypertension This is a chronic problem. The current episode started more than 1 year ago. The problem has been waxing and waning since onset. Associated symptoms include anxiety and malaise/fatigue. Pertinent negatives include no blurred vision, peripheral edema or shortness of breath. Risk factors for coronary artery disease include dyslipidemia, diabetes mellitus, obesity and sedentary lifestyle. The current treatment provides moderate improvement. Identifiable causes of hypertension include a thyroid problem.  Diabetes She presents for her follow-up diabetic visit. She has type 2 diabetes mellitus. Pertinent negatives for hypoglycemia include no nervousness/anxiousness. Associated symptoms include fatigue. Pertinent negatives for diabetes include no blurred vision and no foot paresthesias. Symptoms are stable. Diabetic complications include peripheral neuropathy. Risk factors for coronary artery disease include dyslipidemia, diabetes mellitus, hypertension, sedentary lifestyle and post-menopausal. She is following a generally healthy diet. (Does not check BS at home)  Gastroesophageal Reflux She complains of  belching and heartburn. This is a chronic problem. The current episode started more than 1 year ago. The problem occurs occasionally. Associated symptoms include fatigue. She has tried a PPI for the symptoms. The treatment provided moderate relief.  Thyroid Problem Presents for follow-up visit. Symptoms include constipation, depressed mood and fatigue. Patient reports no anxiety. The symptoms have been stable.  Arthritis Presents for follow-up visit. She complains of pain and stiffness. The symptoms have been stable. Affected locations include the left knee, right knee, right MCP and left MCP. Her pain is at a severity of 3/10. Associated symptoms include dysuria and fatigue.  Anxiety Presents for follow-up visit. Symptoms include depressed mood, excessive worry and irritability. Patient reports no nervous/anxious behavior or shortness of breath. Symptoms occur occasionally. The severity of symptoms is moderate.    Depression        This is a chronic problem.  The onset quality is gradual.   The problem occurs intermittently.  Associated symptoms include fatigue.  Associated symptoms include no helplessness, no hopelessness and not sad.  Past treatments include SSRIs - Selective serotonin reuptake inhibitors.  Past medical history includes thyroid problem and anxiety.   Constipation This is a chronic problem. The current episode started more than 1 year ago. The problem has been waxing and waning since onset. Her stool frequency is 2 to 3 times per week. Risk factors include obesity. She has tried laxatives for the symptoms. The treatment provided moderate relief.    Current opioids rx- Ultram 50 mg #20 and Xanax 0.5 mg #20 Effectiveness of current meds-stable, only takes as needed Adverse reactions from pain meds-Constipation Morphine equivalent- 15  Pill count performed-No Last drug screen - 06/13/21 ( high risk q58m moderate risk  q81m low risk yearly ) Urine drug screen today- No Was the  [place]- yes  If yes were their any concerning findings? - no, has not had xanax fill since 11/07/21, and Ultram 12/18/21  Pain contract signed on:06/16/21   Review of Systems  Constitutional:  Positive for fatigue, irritability and malaise/fatigue.  Eyes:  Negative for blurred vision.  Respiratory:  Negative for shortness of breath.   Gastrointestinal:  Positive for constipation and heartburn.  Genitourinary:  Positive for dysuria. Negative for hematuria.  Musculoskeletal:  Positive for arthritis and stiffness.  Psychiatric/Behavioral:  Positive for depression. The patient is not nervous/anxious.   All other systems reviewed and are negative.      Objective:   Physical Exam Vitals reviewed.  Constitutional:      General: She is not in acute distress.    Appearance: She is well-developed. She is obese.  HENT:     Head: Normocephalic and atraumatic.     Right Ear: Tympanic membrane normal.     Left Ear: Tympanic membrane normal.  Eyes:     Pupils: Pupils are equal, round, and reactive to light.  Neck:     Thyroid: No thyromegaly.  Cardiovascular:     Rate and Rhythm: Normal rate and regular rhythm.     Heart sounds: No murmur heard.    Gallop present.  Pulmonary:     Effort: Pulmonary effort is normal. No respiratory distress.     Breath sounds: Normal breath sounds. No wheezing.  Abdominal:     General: Bowel sounds are normal. There is no distension.     Palpations: Abdomen is soft.     Tenderness: There is no abdominal tenderness.  Musculoskeletal:        General: No tenderness. Normal range of motion.     Cervical back: Normal range of motion and neck supple.  Skin:    General: Skin is warm and dry.  Neurological:     Mental Status: She is alert and oriented to person, place, and time.     Cranial Nerves: No cranial nerve deficit.     Deep Tendon Reflexes: Reflexes are normal and symmetric.  Psychiatric:        Behavior: Behavior normal.        Thought  Content: Thought content normal.        Judgment: Judgment normal.       BP (!) 144/61   Pulse 63   Temp 97.8 F (36.6 C) (Temporal)   Ht _0  (1.575 m)   Wt 162 lb (73.5 kg)   BMI 29.63 kg/m      Assessment & Plan:  Rhonda PETSCHcomes in today with chief complaint of Medical Management of Chronic Issues and Dysuria   Diagnosis and orders addressed:  1. Osteoarthritis, unspecified osteoarthritis type, unspecified site - traMADol (ULTRAM) 50 MG tablet; Take 1 tablet (50 mg total) by mouth every 8 (eight) hours as needed.  Dispense: 20 tablet; Refill: 2 - CMP14+EGFR - CBC with Differential/Platelet  2. Dysuria - Urinalysis, Complete - Urine Culture - CMP14+EGFR - CBC with Differential/Platelet  3. Vitamin D deficiency - CMP14+EGFR - CBC with Differential/Platelet  4. Paroxysmal atrial fibrillation (HCC)  - CMP14+EGFR - CBC with Differential/Platelet  5. Hypothyroidism, unspecified type  - CMP14+EGFR - CBC with Differential/Platelet - TSH  6. Hypertension associated with diabetes ([place] - CMP14+EGFR - CBC with Differential/Platelet  7. Hyperlipidemia associated with type 2 diabetes mellitus (HCC)  - CMP14+EGFR - CBC with Differential/Platelet  8. Hx of non-ST elevation myocardial infarction (NSTEMI)  - CMP14+EGFR - CBC with Differential/Platelet  9. Gastroesophageal reflux disease without esophagitis - CMP14+EGFR - CBC with Differential/Platelet  10. Generalized osteoarthritis of multiple sites  - CMP14+EGFR - CBC with Differential/Platelet  11. GAD (generalized anxiety disorder) - CMP14+EGFR - CBC with Differential/Platelet  12. Type 2 diabetes mellitus with other specified complication, without long-term current use of insulin (HCC)  - Bayer DCA Hb A1c Waived - CMP14+EGFR - CBC with Differential/Platelet  13. Moderate episode of recurrent major depressive disorder (HCC) - CMP14+EGFR - CBC with Differential/Platelet  14.  Constipation, unspecified constipation type  - CMP14+EGFR - CBC with Differential/Platelet  15. Diverticulosis of colon without hemorrhage  - CMP14+EGFR - CBC with Differential/Platelet  16. Need for immunization against influenza - Flu Vaccine QUAD High Dose(Fluad) - CMP14+EGFR - CBC with Differential/Platelet  17. Acute cystitis without hematuria Start Keflex  Force fluids AZO over the counter X2 days - cephALEXin (KEFLEX) 500 MG capsule; Take 1 capsule (500 mg total) by mouth 2 (two) times daily.  Dispense: 14 capsule; Refill: 0 - clobetasol cream (TEMOVATE) 0.05 %; Apply 1 Application topically 2 (two) times daily.  Dispense: 30 g; Refill: 0   Labs pending Will hold off on BP medications at this time given increase stress with husband, brother, and son.  Health Maintenance reviewed Diet and exercise encouraged  Follow up plan: 1 month  Evelina Dun, FNP

## 2022-03-20 NOTE — Progress Notes (Signed)
[place] [facility])                                            [place] Team    03/20/2022  EVER HALBERG February 19, 1941 [id number]                                      Medication Assistance Referral  Referral From: [place]  Medication/Company: Rolan Lipa / [employer] Patient application portion:  Mailed Provider application portion: Faxed  to Evelina Dun, FNP Provider address/fax verified via: Office website  Medication/Company: Vania Rea / [place] Patient application portion:  Mailed Provider application portion: Faxed  to Evelina Dun, [place] Provider address/fax verified via: Office website  Shakiera Edelson P. Bryant Lipps, [place]  [phone]

## 2022-03-20 NOTE — Patient Instructions (Signed)
Lichen Sclerosus Lichen sclerosus is a skin problem. It can happen on any part of the body, but it commonly involves the anal and genital areas. It can cause itching and discomfort in these areas. Treatment can help to control symptoms. When the genital area is affected, getting treatment is important because the condition can cause scarring that may lead to other problems if left untreated. What are the causes? The cause of this condition is not known. It may be related to an overactive immune system or a lack of certain hormones. Lichen sclerosus is not an infection or a fungus, and it is not passed from one person to another (non-contagious). What increases the risk? The following factors may make you more likely to develop this condition: You are a woman who has reached menopause. You are a man who was not circumcised. This condition may also develop for the first time in children, usually before they enter puberty. What are the signs or symptoms? Symptoms of this condition include: White areas (plaques) on the skin that may be thin and wrinkled, or thickened. Red and swollen patches (lesions) on the skin. Tears or cracks in the skin. Bruising. Blood blisters. Severe itching. Pain, itching, or burning when urinating. Constipation is also common in children with lichen sclerosus, but can be seen in adults. How is this diagnosed? This condition may be diagnosed with a physical exam. In some cases, a tissue sample may be removed to be checked under a microscope (biopsy). How is this treated? This condition may be treated with: Topical steroids. These are medicated creams or ointments that are applied over the affected areas. Medicines that are taken by mouth. Topical immunotherapy. These are medicated creams or ointments that are applied over the affected areas. They stimulate your immune system to fight the skin condition. This may be used if steroids are not effective. Surgery. This may  be needed in more severe cases that are causing problems such as scarring. Follow these instructions at home: Medicines Take over-the-counter and prescription medicines only as told by your health care provider. Use creams or ointments as told by your health care provider. Skin care Do not scratch the affected areas of skin. If you are a woman, be sure to keep the vaginal area as clean and dry as possible. Clean the affected area of skin gently with water only. Pat skin dry and avoid the use of rough towels or toilet paper. Avoid irritating skin products, including soap and scented lotions. Use emollient creams as directed by your health care provider to help reduce itching. General instructions Keep all follow-up visits. This is important. Your condition may cause constipation. To prevent or treat constipation, you may need to: Drink enough fluid to keep your urine pale yellow. Take over-the-counter or prescription medicines. Eat foods that are high in fiber, such as beans, whole grains, and fresh fruits and vegetables. Limit foods that are high in fat and processed sugars, such as fried or sweet foods. Contact a health care provider if: You have increasing redness, swelling, or pain in the affected area. You have fluid, blood, or pus coming from the affected area. You have new lesions on your skin. You have a fever. You have pain during sex. Get help right away if: You develop severe pain or burning in the affected areas, especially in the genital area. Summary Lichen sclerosus is a skin problem. When the genital area is affected, getting treatment is important because the condition can cause scarring that may   lead to other problems if left untreated. This condition is usually treated with medicated creams or ointments (topical steroids) that are applied over the affected areas. Take or use over-the-counter and prescription medicines only as told by your health care provider. Contact a  health care provider if you have new lesions on your skin, have pain during sex, or have increasing redness, swelling, or pain in the affected area. Keep all follow-up visits. This is important. This information is not intended to replace advice given to you by your health care provider. Make sure you discuss any questions you have with your health care provider. Document Revised: 08/08/2019 Document Reviewed: 08/08/2019 Elsevier Patient Education  2023 Elsevier Inc.  

## 2022-03-21 LAB — URINE CULTURE

## 2022-03-21 LAB — CBC WITH DIFFERENTIAL/PLATELET
Basophils Absolute: 0.1 10*3/uL (ref 0.0–0.2)
Basos: 1 %
EOS (ABSOLUTE): 0.2 10*3/uL (ref 0.0–0.4)
Eos: 3 %
Hematocrit: 35.5 % (ref 34.0–46.6)
Hemoglobin: 11.5 g/dL (ref 11.1–15.9)
Immature Grans (Abs): 0 10*3/uL (ref 0.0–0.1)
Immature Granulocytes: 0 %
Lymphocytes Absolute: 1.5 10*3/uL (ref 0.7–3.1)
Lymphs: 28 %
MCH: 30.6 pg (ref 26.6–33.0)
MCHC: 32.4 g/dL (ref 31.5–35.7)
MCV: 94 fL (ref 79–97)
Monocytes Absolute: 0.5 10*3/uL (ref 0.1–0.9)
Monocytes: 9 %
Neutrophils Absolute: 3.1 10*3/uL (ref 1.4–7.0)
Neutrophils: 59 %
Platelets: 113 10*3/uL — ABNORMAL LOW (ref 150–450)
RBC: 3.76 x10E6/uL — ABNORMAL LOW (ref 3.77–5.28)
RDW: 13.5 % (ref 11.7–15.4)
WBC: 5.3 10*3/uL (ref 3.4–10.8)

## 2022-03-21 LAB — CMP14+EGFR
ALT: 11 IU/L (ref 0–32)
AST: 15 IU/L (ref 0–40)
Albumin/Globulin Ratio: 1.8 (ref 1.2–2.2)
Albumin: 4.1 g/dL (ref 3.8–4.8)
Alkaline Phosphatase: 85 IU/L (ref 44–121)
BUN/Creatinine Ratio: 12 (ref 12–28)
BUN: 14 mg/dL (ref 8–27)
Bilirubin Total: 0.7 mg/dL (ref 0.0–1.2)
CO2: 24 mmol/L (ref 20–29)
Calcium: 9.6 mg/dL (ref 8.7–10.3)
Chloride: 106 mmol/L (ref 96–106)
Creatinine, Ser: 1.16 mg/dL — ABNORMAL HIGH (ref 0.57–1.00)
Globulin, Total: 2.3 g/dL (ref 1.5–4.5)
Glucose: 141 mg/dL — ABNORMAL HIGH (ref 70–99)
Potassium: 3.9 mmol/L (ref 3.5–5.2)
Sodium: 145 mmol/L — ABNORMAL HIGH (ref 134–144)
Total Protein: 6.4 g/dL (ref 6.0–8.5)
eGFR: 48 mL/min/{1.73_m2} — ABNORMAL LOW (ref >59)

## 2022-03-21 LAB — TSH: TSH: 1.55 u[IU]/mL (ref 0.450–4.500)

## 2022-04-13 ENCOUNTER — Telehealth: Payer: Self-pay | Admitting: Pharmacy Technician

## 2022-04-13 DIAGNOSIS — Z596 Low income: Secondary | ICD-10-CM

## 2022-04-13 NOTE — Progress Notes (Signed)
[place] [facility])                                            [place] Team    04/13/2022  SHAKEMA SURITA 10/25/1940 [id number]  Received both patient and provider portion(s) of patient assistance application(s) for Jardiance and Linzess. Faxed completed application and required documents into [employer].    Sayler Mickiewicz P. Nayelie Gionfriddo, [place]  [phone]

## 2022-04-20 ENCOUNTER — Ambulatory Visit (INDEPENDENT_AMBULATORY_CARE_PROVIDER_SITE_OTHER): Payer: Medicare Other | Admitting: Family

## 2022-04-20 ENCOUNTER — Encounter: Payer: Self-pay | Admitting: Family

## 2022-04-20 VITALS — BP 150/70 | HR 62 | Temp 98.8°F | Ht 62.0 in | Wt 161.2 lb

## 2022-04-20 DIAGNOSIS — E1169 Type 2 diabetes mellitus with other specified complication: Secondary | ICD-10-CM | POA: Diagnosis not present

## 2022-04-20 DIAGNOSIS — N898 Other specified noninflammatory disorders of vagina: Secondary | ICD-10-CM

## 2022-04-20 DIAGNOSIS — N3000 Acute cystitis without hematuria: Secondary | ICD-10-CM

## 2022-04-20 DIAGNOSIS — E1159 Type 2 diabetes mellitus with other circulatory complications: Secondary | ICD-10-CM

## 2022-04-20 DIAGNOSIS — I152 Hypertension secondary to endocrine disorders: Secondary | ICD-10-CM

## 2022-04-20 DIAGNOSIS — L900 Lichen sclerosus et atrophicus: Secondary | ICD-10-CM | POA: Diagnosis not present

## 2022-04-20 LAB — WET PREP FOR TRICH, YEAST, CLUE
Clue Cell Exam: NEGATIVE
Trichomonas Exam: NEGATIVE
Yeast Exam: NEGATIVE

## 2022-04-20 MED ORDER — CLOBETASOL PROPIONATE 0.05 % EX CREA: 1.0000 | TOPICAL_CREAM | Freq: Two times a day (BID) | CUTANEOUS | 2 refills | Status: DC

## 2022-04-20 NOTE — Patient Instructions (Signed)
Lichen Sclerosus Lichen sclerosus is a skin problem. It can happen on any part of the body, but it commonly involves the anal and genital areas. It can cause itching and discomfort in these areas. Treatment can help to control symptoms. When the genital area is affected, getting treatment is important because the condition can cause scarring that may lead to other problems if left untreated. What are the causes? The cause of this condition is not known. It may be related to an overactive immune system or a lack of certain hormones. Lichen sclerosus is not an infection or a fungus, and it is not passed from one person to another (non-contagious). What increases the risk? The following factors may make you more likely to develop this condition: You are a woman who has reached menopause. You are a man who was not circumcised. This condition may also develop for the first time in children, usually before they enter puberty. What are the signs or symptoms? Symptoms of this condition include: White areas (plaques) on the skin that may be thin and wrinkled, or thickened. Red and swollen patches (lesions) on the skin. Tears or cracks in the skin. Bruising. Blood blisters. Severe itching. Pain, itching, or burning when urinating. Constipation is also common in children with lichen sclerosus, but can be seen in adults. How is this diagnosed? This condition may be diagnosed with a physical exam. In some cases, a tissue sample may be removed to be checked under a microscope (biopsy). How is this treated? This condition may be treated with: Topical steroids. These are medicated creams or ointments that are applied over the affected areas. Medicines that are taken by mouth. Topical immunotherapy. These are medicated creams or ointments that are applied over the affected areas. They stimulate your immune system to fight the skin condition. This may be used if steroids are not effective. Surgery. This may  be needed in more severe cases that are causing problems such as scarring. Follow these instructions at home: Medicines Take over-the-counter and prescription medicines only as told by your health care provider. Use creams or ointments as told by your health care provider. Skin care Do not scratch the affected areas of skin. If you are a woman, be sure to keep the vaginal area as clean and dry as possible. Clean the affected area of skin gently with water only. Pat skin dry and avoid the use of rough towels or toilet paper. Avoid irritating skin products, including soap and scented lotions. Use emollient creams as directed by your health care provider to help reduce itching. General instructions Keep all follow-up visits. This is important. Your condition may cause constipation. To prevent or treat constipation, you may need to: Drink enough fluid to keep your urine pale yellow. Take over-the-counter or prescription medicines. Eat foods that are high in fiber, such as beans, whole grains, and fresh fruits and vegetables. Limit foods that are high in fat and processed sugars, such as fried or sweet foods. Contact a health care provider if: You have increasing redness, swelling, or pain in the affected area. You have fluid, blood, or pus coming from the affected area. You have new lesions on your skin. You have a fever. You have pain during sex. Get help right away if: You develop severe pain or burning in the affected areas, especially in the genital area. Summary Lichen sclerosus is a skin problem. When the genital area is affected, getting treatment is important because the condition can cause scarring that may   lead to other problems if left untreated. This condition is usually treated with medicated creams or ointments (topical steroids) that are applied over the affected areas. Take or use over-the-counter and prescription medicines only as told by your health care provider. Contact a  health care provider if you have new lesions on your skin, have pain during sex, or have increasing redness, swelling, or pain in the affected area. Keep all follow-up visits. This is important. This information is not intended to replace advice given to you by your health care provider. Make sure you discuss any questions you have with your health care provider. Document Revised: 08/08/2019 Document Reviewed: 08/08/2019 Elsevier Patient Education  2023 Elsevier Inc.  

## 2022-04-20 NOTE — Progress Notes (Signed)
Subjective:    Patient ID: Rhonda Garcia, female    DOB: 11-Jun-1940, 82 y.o.   MRN: [id number]  Chief Complaint  Patient presents with   Lichen Sclerosus   PT presents to the office today with vaginal itching. She has been diagnose with lichen sclerosus and using clobetasol cream  She does report since starting Jardiance her itching is worse.  Vaginal Itching The patient's primary symptoms include genital itching. The patient's pertinent negatives include no genital odor, vaginal bleeding or vaginal discharge. This is a chronic problem. The current episode started more than 1 year ago. The problem occurs intermittently. The patient is experiencing no pain.  Diabetes She presents for her follow-up diabetic visit. She has type 2 diabetes mellitus. Associated symptoms include blurred vision and foot paresthesias. Symptoms are stable. Diabetic complications include peripheral neuropathy. Risk factors for coronary artery disease include dyslipidemia and sedentary lifestyle. Her overall blood glucose range is 110-130 mg/dl.  Hypertension This is a chronic problem. The current episode started more than 1 year ago. The problem has been waxing and waning since onset. The problem is uncontrolled. Associated symptoms include blurred vision and malaise/fatigue. Pertinent negatives include no peripheral edema or shortness of breath. Risk factors for coronary artery disease include dyslipidemia, diabetes mellitus and sedentary lifestyle. The current treatment provides moderate improvement.      Review of Systems  Constitutional:  Positive for malaise/fatigue.  Eyes:  Positive for blurred vision.  Respiratory:  Negative for shortness of breath.   Genitourinary:  Negative for vaginal discharge.  All other systems reviewed and are negative.      Objective:   Physical Exam Vitals reviewed.  Constitutional:      General: She is not in acute distress.    Appearance: She is well-developed. She  is obese.  HENT:     Head: Normocephalic and atraumatic.  Eyes:     Pupils: Pupils are equal, round, and reactive to light.  Neck:     Thyroid: No thyromegaly.  Cardiovascular:     Rate and Rhythm: Normal rate and regular rhythm.     Heart sounds: Normal heart sounds. No murmur heard. Pulmonary:     Effort: Pulmonary effort is normal. No respiratory distress.     Breath sounds: Normal breath sounds. No wheezing.  Abdominal:     General: Bowel sounds are normal. There is no distension.     Palpations: Abdomen is soft.     Tenderness: There is no abdominal tenderness.  Musculoskeletal:        General: No tenderness. Normal range of motion.     Cervical back: Normal range of motion and neck supple.  Skin:    General: Skin is warm and dry.  Neurological:     Mental Status: She is alert and oriented to person, place, and time.     Cranial Nerves: No cranial nerve deficit.     Deep Tendon Reflexes: Reflexes are normal and symmetric.  Psychiatric:        Behavior: Behavior normal.        Thought Content: Thought content normal.        Judgment: Judgment normal.     BP (!) 150/70   Pulse 62   Temp 98.8 F (37.1 C) (Temporal)   Ht '5\' 2"'$  (1.575 m)   Wt 161 lb 3.2 oz (73.1 kg)   SpO2 95%   BMI 29.48 kg/m        Assessment & Plan:  Rhonda Garcia comes  in today with chief complaint of Lichen Sclerosus   Diagnosis and orders addressed:  2. Lichen sclerosus Continue clobetasol cream at least 3 times a week  - clobetasol cream (TEMOVATE) 0.05 %; Apply 1 Application topically 2 (two) times daily.  Dispense: 60 g; Refill: 2  3. Vagina itching   4. Type 2 diabetes mellitus with other specified complication, without long-term current use of insulin ([place]) Continue medicaitions at this time. If yeast present and continues may need to change Jaridance   5. Hypertension associated with diabetes ([place]) Elevated   Labs pending Health Maintenance reviewed Diet and  exercise encouraged  Follow up plan: Keep chronic follow up   Evelina Dun, FNP

## 2022-04-20 NOTE — Addendum Note (Signed)
Addended by: Ladean Raya on: 04/20/2022 03:39 PM   Modules accepted: Orders

## 2022-04-30 ENCOUNTER — Telehealth: Payer: Self-pay | Admitting: Pharmacy Technician

## 2022-04-30 DIAGNOSIS — Z596 Low income: Secondary | ICD-10-CM

## 2022-04-30 NOTE — Progress Notes (Signed)
[place] [facility])                                            [place] Team    04/30/2022  Rhonda Garcia [birth year] [id number]  Two care coordination calls placed today to [facility].  Care coordination call #1 placed to BI in regard to [facility] application.  Spoke to [place] who informs patient is DENIED as she is over the 250% FPL for a household of 2.  Will assign task to Denyse Amass for possible switch to [place] as patient may be eligible for AZ&ME pending patient and provider approval.  Care coordination call #2 placed to [employer] in regard to [place] application.  Spoke to Santiago Glad who informs application is still processing.  Stacie Knutzen P. [name], [place]  [phone]

## 2022-05-01 ENCOUNTER — Telehealth: Payer: Self-pay | Admitting: Pharmacist

## 2022-05-01 NOTE — Progress Notes (Signed)
[place] [facility])                                            [place] Team    05/01/2022  Rhonda Garcia 06-07-1940 [id number]  Patient was called regarding her medication assistance application for Jardiance.  HIPAA identifiers were obtained.  Unfortunately, she is over income for BI's program to receive Jardiance.  Wilder Glade is in the same drug class family and is offered by AZ&Me.  Their program does not have the same financial requirements as the [facility] program.  If deemed therapeutically appropriate, the patient's provider could switch the patient to [place].  If approved, the patient would be able to receive Farxiga at no cost.  Plan: Route note to the patient's PCP to see if she can be switched to [place]. Follow up.  Elayne Guerin, PharmD, [place] Clinical Pharmacist [phone]

## 2022-05-03 MED ORDER — DAPAGLIFLOZIN PROPANEDIOL 5 MG PO TABS: 5.0000 mg | ORAL_TABLET | Freq: Every day | ORAL | 1 refills | Status: DC

## 2022-05-03 NOTE — Addendum Note (Signed)
Addended by: Evelina Dun A on: 05/03/2022 01:24 PM   Modules accepted: Orders

## 2022-05-14 ENCOUNTER — Telehealth: Payer: Self-pay | Admitting: Family

## 2022-05-14 NOTE — Telephone Encounter (Signed)
Can PCP advise on notes that was sent to her on 05/01/22 by Alwyn Ren with [facility] about switching from [place] to [place]?

## 2022-05-15 ENCOUNTER — Telehealth: Payer: Self-pay | Admitting: Pharmacy Technician

## 2022-05-15 DIAGNOSIS — Z596 Low income: Secondary | ICD-10-CM

## 2022-05-15 NOTE — Progress Notes (Signed)
[place] [facility])                                            [place] Team    05/15/2022  Rhonda Garcia 07-12-1940 [id number]  Care coordination call placed to [name] in regard to [place] application.  Spoke to [name] who informs patient is APPROVED 05/14/22-04/09/23. She informs first shipment will go out automatically but She also informs patient will need to phone in for subsequent refills approximately 2 weeks before running out of medication by calling [phone].  [name] P. Elic Vencill, [place]  [phone]

## 2022-05-15 NOTE — Telephone Encounter (Signed)
Rhonda Garcia was changed to Rhonda Garcia. Please advise next steps as patient has been able to get medication.

## 2022-05-17 ENCOUNTER — Telehealth: Payer: Self-pay | Admitting: Pharmacy Technician

## 2022-05-17 DIAGNOSIS — Z596 Low income: Secondary | ICD-10-CM

## 2022-05-17 NOTE — Progress Notes (Signed)
[place] [facility])                                            [place] Team    05/17/2022  KRITIKA STUKES August 01, 1940 [id number]                                      Medication Assistance Referral  Referral From: [employer]  Medication/Company: Wilder Glade / AZ&ME Patient application portion:  Mailed Provider application portion: Faxed  to Evelina Dun, FNP Provider address/fax verified via: Office website   Demetrice Amstutz P. Hailyn Zarr, [place]  [phone]

## 2022-05-18 NOTE — Telephone Encounter (Signed)
It looks like farxiga is being handled by [facility] No appt with me needed unless patient has questions  Thanks!

## 2022-05-21 ENCOUNTER — Other Ambulatory Visit: Payer: Self-pay | Admitting: Family

## 2022-06-07 ENCOUNTER — Telehealth: Payer: Self-pay | Admitting: Pharmacy Technician

## 2022-06-07 DIAGNOSIS — Z596 Low income: Secondary | ICD-10-CM

## 2022-06-07 NOTE — Progress Notes (Signed)
[facility])                                            [place] Team    06/07/2022  JERILYN SCIRE 04-05-1941 [id number]  Received both patient and provider portion(s) of patient assistance application(s) for [place]. Faxed completed application and required documents into AZ&ME.    Nanetta Wiegman P. Savien Mamula, [place]  [phone]

## 2022-06-12 ENCOUNTER — Telehealth: Payer: Self-pay | Admitting: Pharmacy Technician

## 2022-06-12 DIAGNOSIS — Z596 Low income: Secondary | ICD-10-CM

## 2022-06-12 NOTE — Progress Notes (Signed)
[facility])                                            [place] Team    06/12/2022  Rhonda Garcia 04/25/1940 [id number]  Care coordination call placed to AZ[employer]ME in regard to [facility] application.  Spoke to [place] who informs patient is APPROVED 06/11/22-04/09/23. Medication orders will process automatically and be delivered to the patient's home.  Breyer Tejera P. Sadik Piascik, [place]  [phone]

## 2022-06-13 DIAGNOSIS — H25813 Combined forms of age-related cataract, bilateral: Secondary | ICD-10-CM | POA: Diagnosis not present

## 2022-06-13 DIAGNOSIS — E119 Type 2 diabetes mellitus without complications: Secondary | ICD-10-CM | POA: Diagnosis not present

## 2022-06-13 DIAGNOSIS — H43811 Vitreous degeneration, right eye: Secondary | ICD-10-CM | POA: Diagnosis not present

## 2022-06-13 LAB — HM DIABETES EYE EXAM

## 2022-06-21 DIAGNOSIS — H02831 Dermatochalasis of right upper eyelid: Secondary | ICD-10-CM | POA: Diagnosis not present

## 2022-06-21 DIAGNOSIS — E119 Type 2 diabetes mellitus without complications: Secondary | ICD-10-CM | POA: Diagnosis not present

## 2022-06-21 DIAGNOSIS — H40013 Open angle with borderline findings, low risk, bilateral: Secondary | ICD-10-CM | POA: Diagnosis not present

## 2022-06-21 DIAGNOSIS — H25813 Combined forms of age-related cataract, bilateral: Secondary | ICD-10-CM | POA: Diagnosis not present

## 2022-06-25 ENCOUNTER — Other Ambulatory Visit: Payer: Self-pay | Admitting: Family

## 2022-06-25 ENCOUNTER — Other Ambulatory Visit: Payer: Self-pay | Admitting: Cardiology

## 2022-06-25 DIAGNOSIS — R112 Nausea with vomiting, unspecified: Secondary | ICD-10-CM

## 2022-06-25 DIAGNOSIS — E039 Hypothyroidism, unspecified: Secondary | ICD-10-CM

## 2022-06-25 DIAGNOSIS — K219 Gastro-esophageal reflux disease without esophagitis: Secondary | ICD-10-CM

## 2022-06-25 DIAGNOSIS — I480 Paroxysmal atrial fibrillation: Secondary | ICD-10-CM

## 2022-06-25 DIAGNOSIS — E1169 Type 2 diabetes mellitus with other specified complication: Secondary | ICD-10-CM

## 2022-06-25 NOTE — Telephone Encounter (Signed)
Eliquis 5mg  refill request received. Patient is 82 years old, weight-73.1kg, Crea-1.16 on 03/20/22, Diagnosis-Afib, and last seen by Dr. Percival Spanish on 12/20/21. Dose is appropriate based on dosing criteria. Will send in refill to requested pharmacy.

## 2022-07-05 DIAGNOSIS — H25812 Combined forms of age-related cataract, left eye: Secondary | ICD-10-CM | POA: Diagnosis not present

## 2022-07-09 NOTE — H&P (Signed)
Surgical History & Physical  Patient Name: Rhonda Garcia DOB: March 30, 1941  Surgery: Cataract extraction with intraocular lens implant phacoemulsification; Left Eye  Surgeon: Baruch Goldmann MD Surgery Date:  07-13-22 Pre-Op Date:  06-21-22  HPI: A 21 Yr. old female patient 1. The patient complains of difficulty when recognizing people, which began 12 months ago. Both eyes are affected. The episode is constant. The condition's severity is worsening. Symptoms occur when the patient is driving, inside and reading. presents for cataract evaluation OU referred by Dr. Wynetta Emery. pt complains of difficulty reading small print on labels and books. pt complains of inability to recognize people across the street or further. pt complains of glare and does not feel safe driving at night around other lights. pt denies discomfort OU and is not currently using any drops. HPI was performed by Baruch Goldmann .  Medical History: Glaucoma Cataracts Type 2 Diabetes Arthritis Diabetes - DM Type 2 Heart Problem High Blood Pressure Thyroid Problems acid reflux  Review of Systems Cardiovascular High Blood Pressure, afib Musculoskeletal Joint Ache All recorded systems are negative except as noted above.  Social   Never smoked    Alcohol Never  Medication Eliquis, Jardiance, Lagevrio, Linzess, Metoprolol, Isosorbide,   Sx/Procedures Breast Sx, Appendectomy, Gallbladder Sx, Knee sx x2, Heart Stents,   Drug Allergies  Phenergan,   History & Physical: Heent: cataract, left eye NECK: supple without bruits LUNGS: lungs clear to auscultation CV: regular rate and rhythm Abdomen: soft and non-tender Impression & Plan: Assessment: 1.  COMBINED FORMS AGE RELATED CATARACT; Both Eyes (H25.813) 2.  Diabetes Type 2 No retinopathy (E11.9) 3.  OAG BORDERLINE FINDINGS LOW RISK; Both Eyes (H40.013) 4.  DERMATOCHALASIS, no surgery; Right Upper Lid, Left Upper Lid (H02.831, H02.834) 5.  BLEPHARITIS; Right  Upper Lid, Right Lower Lid, Left Upper Lid, Left Lower Lid (H01.001, H01.002,H01.004,H01.005) 6.  CONJUNCTIVOCHALASIS; Both Eyes (H11.823) 7.  VITREOUS DETACHMENT PVD; Both Eyes (H43.813) 8.  ASTIGMATISM, REGULAR; Both Eyes (H52.223)  Plan: 1.  Cataract accounts for the patient's decreased vision. This visual impairment is not correctable with a tolerable change in glasses or contact lenses. Cataract surgery with an implantation of a new lens should significantly improve the visual and functional status of the patient. Discussed all risks, benefits, alternatives, and potential complications. Discussed the procedures and recovery. Patient desires to have surgery. A-scan ordered and performed today for intra-ocular lens calculations. The surgery will be performed in order to improve vision for driving, reading, and for eye examinations. Recommend phacoemulsification with intra-ocular lens. Recommend Dextenza for post-operative pain and inflammation. Left Eye worse - first. Dilates well - shugarcaine by protocol. Recommend Toric Lens.  2.  Stressed importance of blood sugar and blood pressure control, and also yearly eye examinations. Discussed the need for ongoing proactive ocular exams and treatment, hopefully before visual symptoms develop.  3.  Based on cup-to-disc ratio. Findings, prognosis and treatment options reviewed. OCT rNFL WNL OD, thinning OS 06/21/22 IOP WNL OU. Monitor.  4.  Likely visually significant.  5.  Recommend regular lid cleaning. Warm compresses 7-10 minutes every day, both eyes.  6.  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  7.  RD precautions given. Patient to call with increase in flashing lights/floaters/dark curtain.  8.  Recommend toric IOL

## 2022-07-11 ENCOUNTER — Other Ambulatory Visit: Payer: Self-pay

## 2022-07-11 ENCOUNTER — Encounter (HOSPITAL_COMMUNITY): Payer: Self-pay

## 2022-07-11 ENCOUNTER — Encounter (HOSPITAL_COMMUNITY)
Admission: RE | Admit: 2022-07-11 | Discharge: 2022-07-11 | Disposition: A | Discharge status: Home / Self Care | Payer: Medicare Other | Source: Ambulatory Visit | Attending: Ophthalmology | Admitting: Ophthalmology

## 2022-07-13 ENCOUNTER — Encounter (HOSPITAL_COMMUNITY)
Admission: RE | Disposition: A | Discharge status: Home / Self Care | Payer: Self-pay | Source: Home / Self Care | Attending: Ophthalmology

## 2022-07-13 ENCOUNTER — Ambulatory Visit (HOSPITAL_BASED_OUTPATIENT_CLINIC_OR_DEPARTMENT_OTHER): Payer: Medicare Other | Admitting: Anesthesiology

## 2022-07-13 ENCOUNTER — Ambulatory Visit (HOSPITAL_COMMUNITY)
Admission: RE | Admit: 2022-07-13 | Discharge: 2022-07-13 | Disposition: A | Discharge status: Home / Self Care | Payer: Medicare Other | Attending: Ophthalmology | Admitting: Ophthalmology

## 2022-07-13 ENCOUNTER — Ambulatory Visit (HOSPITAL_COMMUNITY): Payer: Medicare Other | Admitting: Anesthesiology

## 2022-07-13 DIAGNOSIS — H25812 Combined forms of age-related cataract, left eye: Secondary | ICD-10-CM | POA: Insufficient documentation

## 2022-07-13 DIAGNOSIS — D638 Anemia in other chronic diseases classified elsewhere: Secondary | ICD-10-CM | POA: Diagnosis not present

## 2022-07-13 DIAGNOSIS — I2510 Atherosclerotic heart disease of native coronary artery without angina pectoris: Secondary | ICD-10-CM | POA: Diagnosis not present

## 2022-07-13 DIAGNOSIS — E039 Hypothyroidism, unspecified: Secondary | ICD-10-CM

## 2022-07-13 DIAGNOSIS — H5712 Ocular pain, left eye: Secondary | ICD-10-CM

## 2022-07-13 DIAGNOSIS — I252 Old myocardial infarction: Secondary | ICD-10-CM | POA: Diagnosis not present

## 2022-07-13 DIAGNOSIS — K219 Gastro-esophageal reflux disease without esophagitis: Secondary | ICD-10-CM | POA: Insufficient documentation

## 2022-07-13 DIAGNOSIS — I10 Essential (primary) hypertension: Secondary | ICD-10-CM | POA: Insufficient documentation

## 2022-07-13 DIAGNOSIS — K449 Diaphragmatic hernia without obstruction or gangrene: Secondary | ICD-10-CM | POA: Diagnosis not present

## 2022-07-13 DIAGNOSIS — E1136 Type 2 diabetes mellitus with diabetic cataract: Secondary | ICD-10-CM | POA: Insufficient documentation

## 2022-07-13 DIAGNOSIS — I25119 Atherosclerotic heart disease of native coronary artery with unspecified angina pectoris: Secondary | ICD-10-CM | POA: Diagnosis not present

## 2022-07-13 LAB — GLUCOSE, CAPILLARY: Glucose-Capillary: 162 mg/dL — ABNORMAL HIGH (ref 70–99)

## 2022-07-13 SURGERY — CATARACT EXTRACTION PHACO AND INTRAOCULAR LENS PLACEMENT (IOC) with placement of Corticosteroid
Anesthesia: Monitor Anesthesia Care | Site: Eye | Laterality: Left

## 2022-07-13 MED ORDER — EPINEPHRINE PF 1 MG/ML IJ SOLN
INTRAMUSCULAR | Status: AC
  Filled 2022-07-13: qty 1

## 2022-07-13 MED ORDER — EPINEPHRINE PF 1 MG/ML IJ SOLN
INTRAOCULAR | Status: DC | PRN
  Administered 2022-07-13: 500 mL

## 2022-07-13 MED ORDER — TETRACAINE HCL 0.5 % OP SOLN
1.0000 [drp] | OPHTHALMIC | Status: AC | PRN
  Administered 2022-07-13 (×3): 1 [drp] via OPHTHALMIC

## 2022-07-13 MED ORDER — DEXAMETHASONE 0.4 MG OP INST
VAGINAL_INSERT | OPHTHALMIC | Status: DC | PRN
  Administered 2022-07-13: .4 mg via OPHTHALMIC

## 2022-07-13 MED ORDER — BSS IO SOLN
INTRAOCULAR | Status: DC | PRN
  Administered 2022-07-13: 15 mL via INTRAOCULAR

## 2022-07-13 MED ORDER — MIDAZOLAM HCL 2 MG/2ML IJ SOLN
INTRAMUSCULAR | Status: AC
  Filled 2022-07-13: qty 2

## 2022-07-13 MED ORDER — MIDAZOLAM HCL 2 MG/2ML IJ SOLN
INTRAMUSCULAR | Status: DC | PRN
  Administered 2022-07-13: 1 mg via INTRAVENOUS

## 2022-07-13 MED ORDER — DEXAMETHASONE 0.4 MG OP INST
VAGINAL_INSERT | OPHTHALMIC | Status: AC
  Filled 2022-07-13: qty 1

## 2022-07-13 MED ORDER — TROPICAMIDE 1 % OP SOLN
1.0000 [drp] | OPHTHALMIC | Status: AC | PRN
  Administered 2022-07-13 (×3): 1 [drp] via OPHTHALMIC

## 2022-07-13 MED ORDER — LIDOCAINE HCL 3.5 % OP GEL
1.0000 | Freq: Once | OPHTHALMIC | Status: AC
  Administered 2022-07-13: 1 via OPHTHALMIC

## 2022-07-13 MED ORDER — PHENYLEPHRINE HCL 2.5 % OP SOLN
1.0000 [drp] | OPHTHALMIC | Status: AC | PRN
  Administered 2022-07-13 (×3): 1 [drp] via OPHTHALMIC

## 2022-07-13 MED ORDER — POVIDONE-IODINE 5 % OP SOLN
OPHTHALMIC | Status: DC | PRN
  Administered 2022-07-13: 1 via OPHTHALMIC

## 2022-07-13 MED ORDER — STERILE WATER FOR IRRIGATION IR SOLN
Status: DC | PRN
  Administered 2022-07-13: 250 mL

## 2022-07-13 MED ORDER — LIDOCAINE HCL (PF) 1 % IJ SOLN
INTRAOCULAR | Status: DC | PRN
  Administered 2022-07-13: 1 mL via OPHTHALMIC

## 2022-07-13 MED ORDER — SODIUM HYALURONATE 10 MG/ML IO SOLUTION
PREFILLED_SYRINGE | INTRAOCULAR | Status: DC | PRN
  Administered 2022-07-13: .85 mL via INTRAOCULAR

## 2022-07-13 MED ORDER — SODIUM HYALURONATE 23MG/ML IO SOSY
PREFILLED_SYRINGE | INTRAOCULAR | Status: DC | PRN
  Administered 2022-07-13: .6 mL via INTRAOCULAR

## 2022-07-13 MED ORDER — SODIUM CHLORIDE 0.9% FLUSH
INTRAVENOUS | Status: DC | PRN
  Administered 2022-07-13: 3 mL via INTRAVENOUS

## 2022-07-13 SURGICAL SUPPLY — 13 items
CATARACT SUITE SIGHTPATH (MISCELLANEOUS) ×1 IMPLANT
CLOTH BEACON ORANGE TIMEOUT ST (SAFETY) ×1 IMPLANT
EYE SHIELD UNIVERSAL CLEAR (GAUZE/BANDAGES/DRESSINGS) IMPLANT
FEE CATARACT SUITE SIGHTPATH (MISCELLANEOUS) ×1 IMPLANT
GLOVE BIOGEL PI IND STRL 7.0 (GLOVE) ×2 IMPLANT
LENS IOL RAYNER 23.0 (Intraocular Lens) ×1 IMPLANT
LENS IOL RAYONE EMV 23.0 (Intraocular Lens) IMPLANT
NDL HYPO 18GX1.5 BLUNT FILL (NEEDLE) ×1 IMPLANT
NEEDLE HYPO 18GX1.5 BLUNT FILL (NEEDLE) ×1 IMPLANT
PAD ARMBOARD 7.5X6 YLW CONV (MISCELLANEOUS) ×1 IMPLANT
SYR TB 1ML LL NO SAFETY (SYRINGE) ×1 IMPLANT
TAPE SURG TRANSPORE 1 IN (GAUZE/BANDAGES/DRESSINGS) IMPLANT
WATER STERILE IRR 250ML POUR (IV SOLUTION) ×1 IMPLANT

## 2022-07-13 NOTE — Interval H&P Note (Signed)
History and Physical Interval Note:  07/13/2022 10:49 AM  Rhonda Garcia  has presented today for surgery, with the diagnosis of combined forms age related cataract; left.  The various methods of treatment have been discussed with the patient and family. After consideration of risks, benefits and other options for treatment, the patient has consented to  Procedure(s) with comments: CATARACT EXTRACTION PHACO AND INTRAOCULAR LENS PLACEMENT (IOC) with placement of Corticosteroid (Left) - CDE as a surgical intervention.  The patient's history has been reviewed, patient examined, no change in status, stable for surgery.  I have reviewed the patient's chart and labs.  Questions were answered to the patient's satisfaction.     [name]

## 2022-07-13 NOTE — Anesthesia Procedure Notes (Addendum)
Date/Time: 07/13/2022 10:51 AM  Performed by: Franco Nones, CRNAPre-anesthesia Checklist: Patient identified, Emergency Drugs available, Suction available, Timeout performed and Patient being monitored Patient Re-evaluated:Patient Re-evaluated prior to induction Oxygen Delivery Method: Nasal Cannula

## 2022-07-13 NOTE — Op Note (Signed)
Date of procedure: 07/13/22  Pre-operative diagnosis: Visually significant age-related combined cataract, Left Eye (H25.812)  Post-operative diagnosis:  Visually significant age-related combined cataract, Left Eye (H25.812) 2.   Pain and inflammation following cataract surgery, Left Eye (H57.12)  Procedure:  Removal of cataract via phacoemulsification and insertion of intra-ocular lens Rayner [id number] +23.0D into the capsular bag of the Left Eye 2. Placement of Dextenza Implant, Left Lower Lid  Attending surgeon: Rudy Jew. Chauntel Windsor, MD, MA  Anesthesia: MAC, Topical [place]  Complications: None  Estimated Blood Loss: <62mL (minimal)  Specimens: None  Implants: As above  Indications:  Visually significant age-related cataract, Left Eye  Procedure:  The patient was seen and identified in the pre-operative area. The operative eye was identified and dilated.  The operative eye was marked.  Topical anesthesia was administered to the operative eye.     The patient was then to the operative suite and placed in the supine position.  A timeout was performed confirming the patient, procedure to be performed, and all other relevant information.   The patient's face was prepped and draped in the usual fashion for intra-ocular surgery.  A lid speculum was placed into the operative eye and the surgical microscope moved into place and focused.  An inferotemporal paracentesis was created using a 20 gauge paracentesis blade.  Shugarcaine was injected into the anterior chamber.  Viscoelastic was injected into the anterior chamber.  A temporal clear-corneal main wound incision was created using a 2.56mm microkeratome.  A continuous curvilinear capsulorrhexis was initiated using an irrigating cystitome and completed using capsulorrhexis forceps.  Hydrodissection and hydrodeliniation were performed.  Viscoelastic was injected into the anterior chamber.  A phacoemulsification handpiece and a chopper as a second  instrument were used to remove the nucleus and epinucleus. The irrigation/aspiration handpiece was used to remove any remaining cortical material.   The capsular bag was reinflated with viscoelastic, checked, and found to be intact.  The intraocular lens was inserted into the capsular bag.  The irrigation/aspiration handpiece was used to remove any remaining viscoelastic.  The clear corneal wound and paracentesis wounds were then hydrated and checked with Weck-Cels to be watertight.  The lid-speculum was removed. The lower punctum was dilated. A Dextenza implant was placed in the lower canaliculus without complication.   The drape was removed.  The patient's face was cleaned with a wet and dry 4x4.   A clear shield was taped over the eye. The patient was taken to the post-operative care unit in good condition, having tolerated the procedure well.  Post-Op Instructions: The patient will follow up at [facility] for a same day post-operative evaluation and will receive all other orders and instructions.

## 2022-07-13 NOTE — Anesthesia Preprocedure Evaluation (Signed)
Anesthesia Evaluation  Patient identified by MRN, date of birth, ID band Patient awake    Reviewed: Allergy & Precautions, H&P , NPO status , Patient's Chart, lab work & pertinent test results, reviewed documented beta blocker date and time   Airway Mallampati: II  TM Distance: >3 FB Neck ROM: full    Dental no notable dental hx.    Pulmonary neg pulmonary ROS, sleep apnea    Pulmonary exam normal breath sounds clear to auscultation       Cardiovascular Exercise Tolerance: Good hypertension, + angina  + CAD and + Past MI  negative cardio ROS + dysrhythmias  Rhythm:regular Rate:Normal     Neuro/Psych  PSYCHIATRIC DISORDERS Anxiety Depression     Neuromuscular disease negative neurological ROS  negative psych ROS   GI/Hepatic negative GI ROS, Neg liver ROS, hiatal hernia,GERD  ,,  Endo/Other  negative endocrine ROSdiabetesHypothyroidism    Renal/GU negative Renal ROS  negative genitourinary   Musculoskeletal   Abdominal   Peds  Hematology negative hematology ROS (+) Blood dyscrasia, anemia   Anesthesia Other Findings   Reproductive/Obstetrics negative OB ROS                             Anesthesia Physical Anesthesia Plan  ASA: 3  Anesthesia Plan: MAC   Post-op Pain Management:    Induction:   PONV Risk Score and Plan:   Airway Management Planned:   Additional Equipment:   Intra-op Plan:   Post-operative Plan:   Informed Consent: I have reviewed the patients History and Physical, chart, labs and discussed the procedure including the risks, benefits and alternatives for the proposed anesthesia with the patient or authorized representative who has indicated his/her understanding and acceptance.     Dental Advisory Given  Plan Discussed with: CRNA  Anesthesia Plan Comments:        Anesthesia Quick Evaluation

## 2022-07-13 NOTE — Transfer of Care (Signed)
Immediate Anesthesia Transfer of Care Note  Patient: Rhonda Garcia  Procedure(s) Performed: CATARACT EXTRACTION PHACO AND INTRAOCULAR LENS PLACEMENT (IOC) with placement of Corticosteroid (Left: Eye)  Patient Location: Short Stay  Anesthesia Type:MAC  Level of Consciousness: awake  Airway & Oxygen Therapy: Patient Spontanous Breathing  Post-op Assessment: Report given to RN and Post -op Vital signs reviewed and stable  Post vital signs: Reviewed and stable  Last Vitals:  Vitals Value Taken Time  BP 141/65 07/13/22 1110  Temp 36.6 C 07/13/22 1110  Pulse 57 07/13/22 1110  Resp 14 07/13/22 1110  SpO2 100 % 07/13/22 1110    Last Pain:  Vitals:   07/13/22 1110  TempSrc: Oral  PainSc: 3       Patients Stated Pain Goal: 3 (07/13/22 1110)  Complications: No notable events documented.

## 2022-07-13 NOTE — Discharge Instructions (Addendum)
Please discharge patient when stable, will follow up today with Dr. Wrzosek at the [facility] [place] office immediately following discharge.  Leave shield in place until visit.  All paperwork with discharge instructions will be given at the office.   [facility] Address:  [address]  , [place] [address]  

## 2022-07-14 NOTE — Anesthesia Postprocedure Evaluation (Signed)
Anesthesia Post Note  Patient: Rhonda Garcia  Procedure(s) Performed: CATARACT EXTRACTION PHACO AND INTRAOCULAR LENS PLACEMENT (IOC) with placement of Corticosteroid (Left: Eye)  Patient location during evaluation: Phase II Anesthesia Type: MAC Level of consciousness: awake Pain management: pain level controlled Vital Signs Assessment: post-procedure vital signs reviewed and stable Respiratory status: spontaneous breathing and respiratory function stable Cardiovascular status: blood pressure returned to baseline and stable Postop Assessment: no headache and no apparent nausea or vomiting Anesthetic complications: no Comments: Late entry   No notable events documented.   Last Vitals:  Vitals:   07/13/22 1110  BP: (!) 141/65  Pulse: (!) 57  Resp: 14  Temp: 36.6 C  SpO2: 100%    Last Pain:  Vitals:   07/13/22 1110  TempSrc: Oral  PainSc: 3                  Windell [place]

## 2022-07-18 ENCOUNTER — Telehealth: Payer: Self-pay | Admitting: Family

## 2022-07-18 NOTE — Telephone Encounter (Signed)
Contacted [name] to schedule their annual wellness visit. Appointment made for 07/25/2022.  Thank you,  Judeth Cornfield,  AMB Clinical Support [facility] AWV Program Direct Dial ??[phone]

## 2022-07-19 DIAGNOSIS — H25811 Combined forms of age-related cataract, right eye: Secondary | ICD-10-CM | POA: Diagnosis not present

## 2022-07-20 ENCOUNTER — Encounter (HOSPITAL_COMMUNITY)
Admission: RE | Admit: 2022-07-20 | Discharge: 2022-07-20 | Disposition: A | Discharge status: Home / Self Care | Payer: Medicare Other | Source: Ambulatory Visit | Attending: Ophthalmology | Admitting: Ophthalmology

## 2022-07-20 ENCOUNTER — Encounter (HOSPITAL_COMMUNITY): Payer: Self-pay

## 2022-07-24 ENCOUNTER — Ambulatory Visit (INDEPENDENT_AMBULATORY_CARE_PROVIDER_SITE_OTHER): Payer: Medicare Other

## 2022-07-24 VITALS — Ht 62.0 in | Wt 168.0 lb

## 2022-07-24 DIAGNOSIS — Z0000 Encounter for general adult medical examination without abnormal findings: Secondary | ICD-10-CM | POA: Diagnosis not present

## 2022-07-24 NOTE — Patient Instructions (Signed)
[name] , Thank you for taking time to come for your Medicare Wellness Visit. I appreciate your ongoing commitment to your health goals. Please review the following plan we discussed and let me know if I can assist you in the future.   These are the goals we discussed:  Goals      DIET - INCREASE WATER INTAKE     Try to drink 6-8 glasses of water daily     Exercise 150 min/wk Moderate Activity     Increase physical activity to 150 min a week.     Exercise 3x per week (30 min per time)     Exercising at the [facility] with [employer] classes or walking are great options.        This is a list of the screening recommended for you and due dates:  Health Maintenance  Topic Date Due   Eye exam for diabetics  05/17/2017   COVID-19 Vaccine (3 - Moderna risk series) 09/01/2019   Mammogram  06/15/2022   Yearly kidney health urinalysis for diabetes  06/16/2022   Hemoglobin A1C  09/19/2022   Flu Shot  11/08/2022   DTaP/Tdap/Td vaccine (2 - Td or Tdap) 12/19/2022   Complete foot exam   12/19/2022   Yearly kidney function blood test for diabetes  03/21/2023   Medicare Annual Wellness Visit  07/24/2023   DEXA scan (bone density measurement)  09/03/2023   Pneumonia Vaccine  Completed   Zoster (Shingles) Vaccine  Completed   HPV Vaccine  Aged Out   Colon Cancer Screening  Discontinued    Advanced directives: Advance directive discussed with you today. I have provided a copy for you to complete at home and have notarized. Once this is complete please bring a copy in to our office so we can scan it into your chart.   Conditions/risks identified: Aim for 30 minutes of exercise or brisk walking, 6-8 glasses of water, and 5 servings of fruits and vegetables each day.   Next appointment: Follow up in one year for your annual wellness visit    Preventive Care 65 Years and Older, Female Preventive care refers to lifestyle choices and visits with your health care provider that can promote  health and wellness. What does preventive care include? A yearly physical exam. This is also called an annual well check. Dental exams once or twice a year. Routine eye exams. Ask your health care provider how often you should have your eyes checked. Personal lifestyle choices, including: Daily care of your teeth and gums. Regular physical activity. Eating a healthy diet. Avoiding tobacco and drug use. Limiting alcohol use. Practicing safe sex. Taking low-dose aspirin every day. Taking vitamin and mineral supplements as recommended by your health care provider. What happens during an annual well check? The services and screenings done by your health care provider during your annual well check will depend on your age, overall health, lifestyle risk factors, and family history of disease. Counseling  Your health care provider may ask you questions about your: Alcohol use. Tobacco use. Drug use. Emotional well-being. Home and relationship well-being. Sexual activity. Eating habits. History of falls. Memory and ability to understand (cognition). Work and work Astronomer. Reproductive health. Screening  You may have the following tests or measurements: Height, weight, and BMI. Blood pressure. Lipid and cholesterol levels. These may be checked every 5 years, or more frequently if you are over 6 years old. Skin check. Lung cancer screening. You may have this screening every year  starting at age 4 if you have a 30-pack-year history of smoking and currently smoke or have quit within the past 15 years. Fecal occult blood test (FOBT) of the stool. You may have this test every year starting at age 3. Flexible sigmoidoscopy or colonoscopy. You may have a sigmoidoscopy every 5 years or a colonoscopy every 10 years starting at age 62. Hepatitis C blood test. Hepatitis B blood test. Sexually transmitted disease (STD) testing. Diabetes screening. This is done by checking your blood sugar  (glucose) after you have not eaten for a while (fasting). You may have this done every 1-3 years. Bone density scan. This is done to screen for osteoporosis. You may have this done starting at age 4. Mammogram. This may be done every 1-2 years. Talk to your health care provider about how often you should have regular mammograms. Talk with your health care provider about your test results, treatment options, and if necessary, the need for more tests. Vaccines  Your health care provider may recommend certain vaccines, such as: Influenza vaccine. This is recommended every year. Tetanus, diphtheria, and acellular pertussis (Tdap, Td) vaccine. You may need a Td booster every 10 years. Zoster vaccine. You may need this after age 53. Pneumococcal 13-valent conjugate (PCV13) vaccine. One dose is recommended after age 25. Pneumococcal polysaccharide (PPSV23) vaccine. One dose is recommended after age 30. Talk to your health care provider about which screenings and vaccines you need and how often you need them. This information is not intended to replace advice given to you by your health care provider. Make sure you discuss any questions you have with your health care provider. Document Released: 04/22/2015 Document Revised: 12/14/2015 Document Reviewed: 01/25/2015 Elsevier Interactive Patient Education  2017 [employer].  Fall Prevention in the Home Falls can cause injuries. They can happen to people of all ages. There are many things you can do to make your home safe and to help prevent falls. What can I do on the outside of my home? Regularly fix the edges of walkways and driveways and fix any cracks. Remove anything that might make you trip as you walk through a door, such as a raised step or threshold. Trim any bushes or trees on the path to your home. Use bright outdoor lighting. Clear any walking paths of anything that might make someone trip, such as rocks or tools. Regularly check to see  if handrails are loose or broken. Make sure that both sides of any steps have handrails. Any raised decks and porches should have guardrails on the edges. Have any leaves, snow, or ice cleared regularly. Use sand or salt on walking paths during winter. Clean up any spills in your garage right away. This includes oil or grease spills. What can I do in the bathroom? Use night lights. Install grab bars by the toilet and in the tub and shower. Do not use towel bars as grab bars. Use non-skid mats or decals in the tub or shower. If you need to sit down in the shower, use a plastic, non-slip stool. Keep the floor dry. Clean up any water that spills on the floor as soon as it happens. Remove soap buildup in the tub or shower regularly. Attach bath mats securely with double-sided non-slip rug tape. Do not have throw rugs and other things on the floor that can make you trip. What can I do in the bedroom? Use night lights. Make sure that you have a light by your bed that is  easy to reach. Do not use any sheets or blankets that are too big for your bed. They should not hang down onto the floor. Have a firm chair that has side arms. You can use this for support while you get dressed. Do not have throw rugs and other things on the floor that can make you trip. What can I do in the kitchen? Clean up any spills right away. Avoid walking on wet floors. Keep items that you use a lot in easy-to-reach places. If you need to reach something above you, use a strong step stool that has a grab bar. Keep electrical cords out of the way. Do not use floor polish or wax that makes floors slippery. If you must use wax, use non-skid floor wax. Do not have throw rugs and other things on the floor that can make you trip. What can I do with my stairs? Do not leave any items on the stairs. Make sure that there are handrails on both sides of the stairs and use them. Fix handrails that are broken or loose. Make sure that  handrails are as long as the stairways. Check any carpeting to make sure that it is firmly attached to the stairs. Fix any carpet that is loose or worn. Avoid having throw rugs at the top or bottom of the stairs. If you do have throw rugs, attach them to the floor with carpet tape. Make sure that you have a light switch at the top of the stairs and the bottom of the stairs. If you do not have them, ask someone to add them for you. What else can I do to help prevent falls? Wear shoes that: Do not have high heels. Have rubber bottoms. Are comfortable and fit you well. Are closed at the toe. Do not wear sandals. If you use a stepladder: Make sure that it is fully opened. Do not climb a closed stepladder. Make sure that both sides of the stepladder are locked into place. Ask someone to hold it for you, if possible. Clearly mark and make sure that you can see: Any grab bars or handrails. First and last steps. Where the edge of each step is. Use tools that help you move around (mobility aids) if they are needed. These include: Canes. Walkers. Scooters. Crutches. Turn on the lights when you go into a dark area. Replace any light bulbs as soon as they burn out. Set up your furniture so you have a clear path. Avoid moving your furniture around. If any of your floors are uneven, fix them. If there are any pets around you, be aware of where they are. Review your medicines with your doctor. Some medicines can make you feel dizzy. This can increase your chance of falling. Ask your doctor what other things that you can do to help prevent falls. This information is not intended to replace advice given to you by your health care provider. Make sure you discuss any questions you have with your health care provider. Document Released: 01/20/2009 Document Revised: 09/01/2015 Document Reviewed: 04/30/2014 Elsevier Interactive Patient Education  2017 [employer].

## 2022-07-24 NOTE — Progress Notes (Signed)
Subjective:   Rhonda Garcia is a 82 y.o. female who presents for Medicare Annual (Subsequent) preventive examination. I connected with  Rhonda Garcia on 07/24/22 by a audio enabled telemedicine application and verified that I am speaking with the correct person using two identifiers.  Patient Location: Home  Provider Location: Home Office  I discussed the limitations of evaluation and management by telemedicine. The patient expressed understanding and agreed to proceed.  Review of Systems     Cardiac Risk Factors include: advanced age (>71men, >59 women);diabetes mellitus;dyslipidemia;hypertension     Objective:    Today's Vitals   07/24/22 1510  Weight: 168 lb (76.2 kg)  Height:  (1.575 m)   Body mass index is 30.73 kg/m.     07/24/2022    3:16 PM 07/13/2022   10:15 AM 07/11/2022    9:23 AM 12/10/2020   10:43 AM 03/21/2020   12:11 PM 05/21/2019    9:30 AM 10/02/2018    8:16 AM  Advanced Directives  Does Patient Have a Medical Advance Directive? No No No No No No   Would patient like information on creating a medical advance directive? No - Patient declined No - Patient declined No - Patient declined  No - Patient declined No - Patient declined No - Patient declined    Current Medications (verified) Outpatient Encounter Medications as of 07/24/2022  Medication Sig   ALPRAZolam (XANAX) 0.5 MG tablet Take 1 tablet (0.5 mg total) by mouth daily as needed.   blood glucose meter kit and supplies Dispense based on patient and insurance preference. Use up to four times daily as directed. (FOR ICD-10 E10.9, E11.9).   busPIRone (BUSPAR) 5 MG tablet TAKE ONE (1) TABLET THREE (3) TIMES EACH DAY   clobetasol cream (TEMOVATE) 0.05 % Apply 1 Application topically 2 (two) times daily.   dapagliflozin propanediol (FARXIGA) 5 MG TABS tablet Take 1 tablet (5 mg total) by mouth daily before breakfast.   ELIQUIS 5 MG TABS tablet TAKE ONE TABLET BY MOUTH TWICE DAILY    escitalopram (LEXAPRO) 10 MG tablet Take 1 tablet (10 mg total) by mouth daily.   glucose blood (ONETOUCH VERIO) test strip Test BS QID Dx E11.9   hydroxychloroquine (PLAQUENIL) 200 MG tablet TAKE ONE (1) TABLET BY MOUTH EVERY DAY   isosorbide mononitrate (IMDUR) 60 MG 24 hr tablet TAKE ONE (1) TABLET EACH DAY   Lancets (ONETOUCH DELICA PLUS LANCET33G) MISC Test BS QID Dx E11.9   levothyroxine (SYNTHROID) 50 MCG tablet TAKE ONE (1) TABLET EACH DAY   linaclotide (LINZESS) 72 MCG capsule Take 1 capsule (72 mcg total) by mouth daily before breakfast.   lisinopril (ZESTRIL) 20 MG tablet Take 1 tablet (20 mg total) by mouth daily.   metFORMIN (GLUCOPHAGE) 1000 MG tablet TAKE 1 TABLET BY MOUTH TWICE DAILY WITH MEALS   metoprolol tartrate (LOPRESSOR) 50 MG tablet TAKE ONE TABLET BY MOUTH TWICE DAILY   nitroGLYCERIN (NITROSTAT) 0.4 MG SL tablet DISSOLVE 1 TAB UNDER TOUNGE FOR CHEST PAIN. MAY REPEAT EVERY 5 MINUTES FOR 3 DOSES. IF NO RELIEF CALL 911 OR GO TO ER   ondansetron (ZOFRAN) 4 MG tablet TAKE ONE TABLET BY MOUTH EVERY EIGHT HOURS AS NEEDED   pantoprazole (PROTONIX) 40 MG tablet TAKE ONE TABLET BY MOUTH TWICE DAILY   pravastatin (PRAVACHOL) 80 MG tablet TAKE 1 TABLET DAILY IN THE EVENING   traMADol (ULTRAM) 50 MG tablet Take 1 tablet (50 mg total) by mouth every 8 (eight) hours as  needed.   No facility-administered encounter medications on file as of 07/24/2022.    Allergies (verified) Fentanyl and Promethazine hcl   History: Past Medical History:  Diagnosis Date   A-fib    Anal fissure    resolved    Anemia    Back pain    with left radiculopathy   CAD (coronary artery disease)    Cataract    Collagen vascular disease    DDD (degenerative disc disease)    Depression    Diabetes mellitus    Diverticulosis    Dyslipidemia    Dysrhythmia    AFib   Esophagitis    GERD (gastroesophageal reflux disease)    Hiatal hernia    Hx of peptic ulcer    Hyperlipidemia    Hypertension     Hypothyroidism    Internal hemorrhoids 2017   NSTEMI (non-ST elevated myocardial infarction) 08/05/2015   Osteopenia    Sleep apnea    not using CPAP now, could not tolerate and PCP aware.   Thyroid disease    Past Surgical History:  Procedure Laterality Date   ABDOMINAL HYSTERECTOMY Bilateral 2001 - approximate   APPENDECTOMY     BREAST REDUCTION SURGERY     CARDIAC CATHETERIZATION N/A 08/08/2015   Procedure: Left Heart Cath and Coronary Angiography;  Surgeon: Kathleene Hazel, MD;  Location: [facility] INVASIVE CV LAB;  Service: Cardiovascular;  Laterality: N/A;   CHOLECYSTECTOMY  2008 - approximate   COLONOSCOPY  06/2007   Friable anal canal and anal papillae. Pancolonic diverticula. Normal terminal ileum. Status post segmental biopsy, descending colon biopsy showed minimal cryptitis. Biopsies felt to be  nonspecific but could be seen with NSAIDs. No features of inflammatory bowel disease. Stool studies were negative.   COLONOSCOPY  03/21/2011   RMR: colonic polyps treated as described above, colonic diverticulosis (Tubular adenomas)   COLONOSCOPY WITH PROPOFOL N/A 06/17/2014   RMR: Colonic diverticulosis. Multiple colonic polyps removed as described above.    COLONOSCOPY WITH PROPOFOL N/A 03/19/2016   Procedure: COLONOSCOPY WITH PROPOFOL;  Surgeon: Corbin Ade, MD;  Location: [facility];  Service: Endoscopy;  Laterality: N/A;  8:45am   COLONOSCOPY WITH PROPOFOL N/A 10/02/2018   Procedure: COLONOSCOPY WITH PROPOFOL;  Surgeon: Corbin Ade, MD;  Location: [facility];  Service: Endoscopy;  Laterality: N/A;  9:15am   CORONARY ANGIOPLASTY WITH STENT PLACEMENT     4 stents   ESOPHAGEAL DILATION N/A 06/17/2014   Procedure: ESOPHAGEAL DILATION;  Surgeon: Corbin Ade, MD;  Location: [facility];  Service: Endoscopy;  Laterality: N/A;  Maloney 54 Fr   ESOPHAGOGASTRODUODENOSCOPY  06/2007   Small hiatal hernia   ESOPHAGOGASTRODUODENOSCOPY  03/21/2011   RMR: normal esophagus- status post  passage of Maloney dilator. Small hiatal hernia. Trivial antral and bulbar erosions   ESOPHAGOGASTRODUODENOSCOPY (EGD) WITH PROPOFOL N/A 06/17/2014   RMR: Small hiatal hernia; otherwise normal EGD. Status post passage of a Maloney dilator. No explanation for patients right upper quadrant abdominal pain.    ESOPHAGOGASTRODUODENOSCOPY (EGD) WITH PROPOFOL N/A 03/19/2016   Procedure: ESOPHAGOGASTRODUODENOSCOPY (EGD) WITH PROPOFOL;  Surgeon: Corbin Ade, MD;  Location: [facility];  Service: Endoscopy;  Laterality: N/A;   KNEE SURGERY Left    arthroscopy   LIPOMA EXCISION  03/17/2012   Procedure: EXCISION LIPOMA;  Surgeon: Cherylynn Ridges, MD;  Location: [facility];  Service: General;  Laterality: Right;  excision of right lower back lipoma   MALONEY DILATION N/A 03/19/2016   Procedure:  MALONEY DILATION;  Surgeon: Corbin Ade, MD;  Location: [facility];  Service: Endoscopy;  Laterality: N/A;   POLYPECTOMY  06/17/2014   Procedure: POLYPECTOMY;  Surgeon: Corbin Ade, MD;  Location: [facility];  Service: Endoscopy;;   POLYPECTOMY  10/02/2018   Procedure: POLYPECTOMY;  Surgeon: Corbin Ade, MD;  Location: [facility];  Service: Endoscopy;;  colon   REDUCTION MAMMAPLASTY Bilateral    Family History  Problem Relation Age of Onset   Heart attack Mother    Hypertension Mother    Stroke Mother        Deceased, age 26   Diabetes Sister    Arthritis Sister    Diabetes Sister    Cancer Sister        ovarian   Stroke Sister    Alzheimer's disease Sister    Diabetes Brother        also has a blood disorder    Clotting disorder Brother    Kidney disease Brother    Heart disease Brother    Melanoma Brother        deceased   Diabetes Son    Hypertension Son    Coronary artery disease Son 74       CABG   Stroke Son    Hypertension Son    Coronary artery disease Son 63       CABG   Colon cancer Neg Hx    Social History   Socioeconomic History   Marital status:  Married    Spouse name: Not on file   Number of children: 4   Years of education: 6   Highest education level: 6th grade  Occupational History   Occupation: Retired    Comment: Engineer, site  Tobacco Use   Smoking status: Never   Smokeless tobacco: Never  Building services engineer Use: Never used  Substance and Sexual Activity   Alcohol use: Not Currently    Alcohol/week: 1.0 standard drink of alcohol    Types: 1 Glasses of wine per week   Drug use: No   Sexual activity: Not Currently    Birth control/protection: Post-menopausal  Other Topics Concern   Not on file  Social History Narrative    Has 4 adult children. She lives home with one of her sons who recently had a stroke and needs 24 hour care. She is the primary caregiver and is now legally separated from her husband.    Social Determinants of Health   Financial Resource Strain: Low Risk  (07/24/2022)   Overall Financial Resource Strain (CARDIA)    Difficulty of Paying Living Expenses: Not hard at all  Food Insecurity: No Food Insecurity (07/24/2022)   Hunger Vital Sign    Worried About Running Out of Food in the Last Year: Never true    Ran Out of Food in the Last Year: Never true  Transportation Needs: No Transportation Needs (07/24/2022)   PRAPARE - Administrator, Civil Service (Medical): No    Lack of Transportation (Non-Medical): No  Physical Activity: Insufficiently Active (07/24/2022)   Exercise Vital Sign    Days of Exercise per Week: 1 day    Minutes of Exercise per Session: 30 min  Stress: No Stress Concern Present (07/24/2022)   [name] of Occupational Health - Occupational Stress Questionnaire    Feeling of Stress : Not at all  Social Connections: Moderately Integrated (07/24/2022)   Social Connection and Isolation Panel [NHANES]  Frequency of Communication with Friends and Family: More than three times a week    Frequency of Social Gatherings with Friends and Family: More than three times  a week    Attends Religious Services: More than 4 times per year    Active Member of [employer] or Organizations: No    Attends Engineer, structural: Never    Marital Status: Married    Tobacco Counseling Counseling given: Not Answered   Clinical Intake:  Pre-visit preparation completed: Yes  Pain : No/denies pain     Nutritional Risks: None Diabetes: Yes CBG done?: No Did pt. bring in CBG monitor from home?: No  How often do you need to have someone help you when you read instructions, pamphlets, or other written materials from your doctor or pharmacy?: 1 - Never  Diabetic?yes  Nutrition Risk Assessment:  Has the patient had any N/V/D within the last 2 months?  No  Does the patient have any non-healing wounds?  No  Has the patient had any unintentional weight loss or weight gain?  No   Diabetes:  Is the patient diabetic?  Yes  If diabetic, was a CBG obtained today?  No  Did the patient bring in their glucometer from home?  No  How often do you monitor your CBG's? Once a week .   Financial Strains and Diabetes Management:  Are you having any financial strains with the device, your supplies or your medication? No .  Does the patient want to be seen by Chronic Care Management for management of their diabetes?  No  Would the patient like to be referred to a Nutritionist or for Diabetic Management?  No   Diabetic Exams:  Diabetic Eye Exam: Completed 07/2022 Diabetic Foot Exam: Overdue, Pt has been advised about the importance in completing this exam. Pt is scheduled for diabetic foot exam on next office visit .   Interpreter Needed?: No  Information entered by :: Renie Ora, LPN   Activities of Daily Living    07/24/2022    3:16 PM 07/11/2022    9:27 AM  In your present state of health, do you have any difficulty performing the following activities:  Hearing? 0   Vision? 0   Difficulty concentrating or making decisions? 0   Walking or climbing stairs?  0   Dressing or bathing? 0   Doing errands, shopping? 0 0  Preparing Food and eating ? N   Using the Toilet? N   In the past six months, have you accidently leaked urine? N   Do you have problems with loss of bowel control? N   Managing your Medications? N   Managing your Finances? N   Housekeeping or managing your Housekeeping? N     Patient Care Team: Junie Spencer, FNP as PCP - General (Nurse Practitioner) Rollene Rotunda, MD as PCP - Cardiology (Cardiology) Jena Gauss Gerrit Friends, MD (Gastroenterology) Rollene Rotunda, MD as Consulting Physician (Cardiology) Landry Mellow, AUD as Audiologist (Audiology) Derryl Harbor, OD as Consulting Physician (Optometry)  Indicate any recent Medical Services you may have received from other than [facility] providers in the past year (date may be approximate).     Assessment:   This is a routine wellness examination for Rhonda Garcia.  Hearing/Vision screen Vision Screening - Comments:: Wears rx glasses - up to date with routine eye exams with  Dr.Worzisck  Dietary issues and exercise activities discussed: Current Exercise Habits: Home exercise routine, Type of exercise: walking, Time (Minutes): 30, Frequency (  Times/Week): 1, Weekly Exercise (Minutes/Week): 30, Intensity: Mild, Exercise limited by: None identified   Goals Addressed             This Visit's Progress    DIET - INCREASE WATER INTAKE   On track    Try to drink 6-8 glasses of water daily       Depression Screen    07/24/2022    3:15 PM 03/20/2022    9:51 AM 12/18/2021   10:26 AM 09/14/2021   10:47 AM 11/22/2020    9:00 AM 08/30/2020    3:54 PM 05/04/2020    8:00 AM  PHQ 2/9 Scores  PHQ - 2 Score 0 0 0 0 0 2 0  PHQ- 9 Score  0 0 4 5 11      Fall Risk    07/24/2022    3:14 PM 04/20/2022    2:43 PM 03/20/2022    9:51 AM 12/18/2021   10:26 AM 06/13/2021   10:13 AM  Fall Risk   Falls in the past year? 0 0 0 0 0  Number falls in past yr: 0      Injury with Fall? 0      Risk for fall  due to : No Fall Risks      Follow up Falls prevention discussed   Falls evaluation completed     FALL RISK PREVENTION PERTAINING TO THE HOME:  Any stairs in or around the home? No  If so, are there any without handrails? No  Home free of loose throw rugs in walkways, pet beds, electrical cords, etc? Yes  Adequate lighting in your home to reduce risk of falls? Yes   ASSISTIVE DEVICES UTILIZED TO PREVENT FALLS:  Life alert? No  Use of a cane, walker or w/c? No  Grab bars in the bathroom? Yes  Shower chair or bench in shower? Yes  Elevated toilet seat or a handicapped toilet? Yes       03/27/2018   10:49 AM 03/25/2017   10:23 AM 03/21/2016   10:25 AM 03/21/2015    2:23 PM  MMSE - Mini Mental State Exam  Orientation to time 5 5 5 3   Orientation to Place 5 5 5 5   Registration 3 3 3 3   Attention/ Calculation 2 2 2 2   Recall 3 2 2 3   Language- name 2 objects 2 2 2 2   Language- repeat 1 1 1 1   Language- follow 3 step command 3 3 3 3   Language- read & follow direction 1 0 1 1  Write a sentence 1 1 1 1   Copy design 1 0 1 0  Total score 27 24 26 24         07/24/2022    3:16 PM 05/21/2019    9:36 AM  6CIT Screen  What Year? 0 points 0 points  What month? 0 points 0 points  What time? 0 points 0 points  Count back from 20 0 points 0 points  Months in reverse 0 points 4 points  Repeat phrase 0 points 2 points  Total Score 0 points 6 points    Immunizations Immunization History  Administered Date(s) Administered   Fluad Quad(high Dose 65+) 12/19/2018, 03/23/2020, 02/22/2021, 03/20/2022   Influenza Split 01/13/2015, 12/31/2016   Influenza, High Dose Seasonal PF 02/07/2015, 01/21/2017, 02/12/2018   Influenza,inj,Quad PF,6+ Mos 01/19/2013, 05/20/2014, 01/10/2016   Moderna Sars-Covid-2 Vaccination 07/02/2019, 08/04/2019   Pneumococcal Conjugate-13 08/13/2013   Pneumococcal Polysaccharide-23 02/07/2015   Tdap 12/18/2012   Zoster  Recombinat (Shingrix) 06/13/2021,  12/18/2021    TDAP status: Up to date  Flu Vaccine status: Up to date  Pneumococcal vaccine status: Up to date  Covid-19 vaccine status: Completed vaccines  Qualifies for Shingles Vaccine? Yes   Zostavax completed Yes   Shingrix Completed?: Yes  Screening Tests Health Maintenance  Topic Date Due   OPHTHALMOLOGY EXAM  05/17/2017   COVID-19 Vaccine (3 - Moderna risk series) 09/01/2019   MAMMOGRAM  06/15/2022   Diabetic kidney evaluation - Urine ACR  06/16/2022   HEMOGLOBIN A1C  09/19/2022   INFLUENZA VACCINE  11/08/2022   DTaP/Tdap/Td (2 - Td or Tdap) 12/19/2022   FOOT EXAM  12/19/2022   Diabetic kidney evaluation - eGFR measurement  03/21/2023   Medicare Annual Wellness (AWV)  07/24/2023   DEXA SCAN  09/03/2023   Pneumonia Vaccine 105+ Years old  Completed   Zoster Vaccines- Shingrix  Completed   HPV VACCINES  Aged Out   COLONOSCOPY (Pts 45-79yrs Insurance coverage will need to be confirmed)  Discontinued    Health Maintenance  Health Maintenance Due  Topic Date Due   OPHTHALMOLOGY EXAM  05/17/2017   COVID-19 Vaccine (3 - Moderna risk series) 09/01/2019   MAMMOGRAM  06/15/2022   Diabetic kidney evaluation - Urine ACR  06/16/2022    Colorectal cancer screening: No longer required.   Mammogram status: No longer required due to age .  Bone Density status: Completed 08/30/2020. Results reflect: Bone density results: OSTEOPOROSIS. Repeat every 3 years.  Lung Cancer Screening: (Low Dose CT Chest recommended if Age 66-80 years, 30 pack-year currently smoking OR have quit w/in 15years.) does not qualify.   Lung Cancer Screening Referral: n/a  Additional Screening:  Hepatitis C Screening: does not qualify;   Vision Screening: Recommended annual ophthalmology exams for early detection of glaucoma and other disorders of the eye. Is the patient up to date with their annual eye exam?  Yes  Who is the provider or what is the name of the office in which the patient attends  annual eye exams? Dr.worsoski If pt is not established with a provider, would they like to be referred to a provider to establish care? No .   Dental Screening: Recommended annual dental exams for proper oral hygiene  Community Resource Referral / Chronic Care Management: CRR required this visit?  No   CCM required this visit?  No      Plan:     I have personally reviewed and noted the following in the patient's chart:   Medical and social history Use of alcohol, tobacco or illicit drugs  Current medications and supplements including opioid prescriptions. Patient is not currently taking opioid prescriptions. Functional ability and status Nutritional status Physical activity Advanced directives List of other physicians Hospitalizations, surgeries, and ER visits in previous 12 months Vitals Screenings to include cognitive, depression, and falls Referrals and appointments  In addition, I have reviewed and discussed with patient certain preventive protocols, quality metrics, and best practice recommendations. A written personalized care plan for preventive services as well as general preventive health recommendations were provided to patient.     Lorrene Reid, LPN   [id number]   Nurse Notes: none

## 2022-07-24 NOTE — H&P (Signed)
Surgical History & Physical  Patient Name: Rhonda Garcia DOB: [birth year]  Surgery: Cataract extraction with intraocular lens implant phacoemulsification; Right Eye  Surgeon: [name] MD Surgery Date:  07-27-22 Pre-Op Date:  07-19-22  HPI: A 65 Yr. old female patient 1. The patient is returning after cataract post-op. The left eye is affected. Status post cataract post-op, 1 week ago: Since the last visit, the affected area is doing well. The patient's vision seems improved but still feels like something on it per pt. Patient is following postop medication instructions. pt complains of difficulty reading small print on labels and books/ recognize people across the street or further in right eye. pt complains of glare and does not feel safe driving at night around other lights. This is negatively affecting the patient's quality of life and the patient is unable to function adequately in life with the current level of vision. HPI was performed by [name] .  Medical History: Glaucoma Cataracts Arthritis Diabetes - DM Type 2 Heart Problem High Blood Pressure Thyroid Problems acid reflux  Review of Systems Cardiovascular High Blood Pressure, afib Musculoskeletal Joint Ache All recorded systems are negative except as noted above.  Social   Never smoked    Alcohol Never  Medication Prednisolone-Moxifloxacin-Bromfenac,  Eliquis, Jardiance, Lagevrio, Linzess, Metoprolol, Isosorbide,   Sx/Procedures Phaco c IOL OS with Dextenza,  Breast Sx, Appendectomy, Gallbladder Sx, Knee sx x2, Heart Stents,  Drug Allergies  Phenergan,   History & Physical: Heent: cataract, right eye NECK: supple without bruits LUNGS: lungs clear to auscultation CV: regular rate and rhythm Abdomen: soft and non-tender Impression & Plan: Assessment: 1.  CATARACT EXTRACTION STATUS; Left Eye (Z98.42) 2.  INTRAOCULAR LENS IOL ; Left Eye (Z96.1) 3.  COMBINED FORMS AGE RELATED CATARACT; Right  Eye (H25.811)  Plan: 1.  1 week after cataract surgery. Doing well with improved vision and normal eye pressure. Call with any problems or concerns. Stop drops - Dextenza. Call with any concerning symptoms.  2.  Doing well since surgery  3.  Cataract accounts for the patient's decreased vision. This visual impairment is not correctable with a tolerable change in glasses or contact lenses. Cataract surgery with an implantation of a new lens should significantly improve the visual and functional status of the patient. Discussed all risks, benefits, alternatives, and potential complications. Discussed the procedures and recovery. Patient desires to have surgery. A-scan ordered and performed today for intra-ocular lens calculations. The surgery will be performed in order to improve vision for driving, reading, and for eye examinations. Recommend phacoemulsification with intra-ocular lens. Recommend Dextenza for post-operative pain and inflammation. Right Eye. Surgery required to correct imbalance of vision. Dilates well - shugarcaine by protocol.

## 2022-07-27 ENCOUNTER — Encounter (HOSPITAL_COMMUNITY): Payer: Self-pay | Admitting: Ophthalmology

## 2022-07-27 ENCOUNTER — Other Ambulatory Visit: Payer: Self-pay

## 2022-07-27 ENCOUNTER — Ambulatory Visit (HOSPITAL_BASED_OUTPATIENT_CLINIC_OR_DEPARTMENT_OTHER): Payer: Medicare Other | Admitting: Anesthesiology

## 2022-07-27 ENCOUNTER — Ambulatory Visit (HOSPITAL_COMMUNITY)
Admission: RE | Admit: 2022-07-27 | Discharge: 2022-07-27 | Disposition: A | Discharge status: Home / Self Care | Payer: Medicare Other | Attending: Ophthalmology | Admitting: Ophthalmology

## 2022-07-27 ENCOUNTER — Encounter (HOSPITAL_COMMUNITY)
Admission: RE | Disposition: A | Discharge status: Home / Self Care | Payer: Self-pay | Source: Home / Self Care | Attending: Ophthalmology

## 2022-07-27 ENCOUNTER — Ambulatory Visit (HOSPITAL_COMMUNITY): Payer: Medicare Other | Admitting: Anesthesiology

## 2022-07-27 DIAGNOSIS — H25811 Combined forms of age-related cataract, right eye: Secondary | ICD-10-CM

## 2022-07-27 DIAGNOSIS — Z9842 Cataract extraction status, left eye: Secondary | ICD-10-CM | POA: Diagnosis not present

## 2022-07-27 DIAGNOSIS — I25119 Atherosclerotic heart disease of native coronary artery with unspecified angina pectoris: Secondary | ICD-10-CM | POA: Insufficient documentation

## 2022-07-27 DIAGNOSIS — I10 Essential (primary) hypertension: Secondary | ICD-10-CM | POA: Insufficient documentation

## 2022-07-27 DIAGNOSIS — I4891 Unspecified atrial fibrillation: Secondary | ICD-10-CM | POA: Diagnosis not present

## 2022-07-27 DIAGNOSIS — Z961 Presence of intraocular lens: Secondary | ICD-10-CM | POA: Diagnosis not present

## 2022-07-27 DIAGNOSIS — Z7984 Long term (current) use of oral hypoglycemic drugs: Secondary | ICD-10-CM

## 2022-07-27 DIAGNOSIS — Z955 Presence of coronary angioplasty implant and graft: Secondary | ICD-10-CM | POA: Insufficient documentation

## 2022-07-27 DIAGNOSIS — Z79899 Other long term (current) drug therapy: Secondary | ICD-10-CM | POA: Insufficient documentation

## 2022-07-27 DIAGNOSIS — H5711 Ocular pain, right eye: Secondary | ICD-10-CM

## 2022-07-27 DIAGNOSIS — I252 Old myocardial infarction: Secondary | ICD-10-CM | POA: Insufficient documentation

## 2022-07-27 DIAGNOSIS — E119 Type 2 diabetes mellitus without complications: Secondary | ICD-10-CM

## 2022-07-27 DIAGNOSIS — K219 Gastro-esophageal reflux disease without esophagitis: Secondary | ICD-10-CM | POA: Diagnosis not present

## 2022-07-27 DIAGNOSIS — K449 Diaphragmatic hernia without obstruction or gangrene: Secondary | ICD-10-CM | POA: Insufficient documentation

## 2022-07-27 DIAGNOSIS — E1136 Type 2 diabetes mellitus with diabetic cataract: Secondary | ICD-10-CM | POA: Diagnosis not present

## 2022-07-27 DIAGNOSIS — G4730 Sleep apnea, unspecified: Secondary | ICD-10-CM | POA: Diagnosis not present

## 2022-07-27 LAB — GLUCOSE, CAPILLARY: Glucose-Capillary: 190 mg/dL — ABNORMAL HIGH (ref 70–99)

## 2022-07-27 SURGERY — CATARACT EXTRACTION PHACO AND INTRAOCULAR LENS PLACEMENT (IOC) with placement of Corticosteroid
Anesthesia: Monitor Anesthesia Care | Site: Eye | Laterality: Right

## 2022-07-27 MED ORDER — LIDOCAINE HCL 3.5 % OP GEL
1.0000 | Freq: Once | OPHTHALMIC | Status: AC
  Administered 2022-07-27: 1 via OPHTHALMIC

## 2022-07-27 MED ORDER — DEXAMETHASONE 0.4 MG OP INST
VAGINAL_INSERT | OPHTHALMIC | Status: AC
  Filled 2022-07-27: qty 1

## 2022-07-27 MED ORDER — TROPICAMIDE 1 % OP SOLN
1.0000 [drp] | OPHTHALMIC | Status: AC | PRN
  Administered 2022-07-27 (×3): 1 [drp] via OPHTHALMIC

## 2022-07-27 MED ORDER — STERILE WATER FOR IRRIGATION IR SOLN
Status: DC | PRN
  Administered 2022-07-27: 250 mL

## 2022-07-27 MED ORDER — MIDAZOLAM HCL 2 MG/2ML IJ SOLN
INTRAMUSCULAR | Status: AC
  Filled 2022-07-27: qty 2

## 2022-07-27 MED ORDER — BSS IO SOLN
INTRAOCULAR | Status: DC | PRN
  Administered 2022-07-27: 15 mL via INTRAOCULAR

## 2022-07-27 MED ORDER — SODIUM HYALURONATE 10 MG/ML IO SOLUTION
PREFILLED_SYRINGE | INTRAOCULAR | Status: DC | PRN
  Administered 2022-07-27: .85 mL via INTRAOCULAR

## 2022-07-27 MED ORDER — ORAL CARE MOUTH RINSE: 15.0000 mL | Freq: Once | OROMUCOSAL | Status: DC

## 2022-07-27 MED ORDER — PHENYLEPHRINE HCL 2.5 % OP SOLN
1.0000 [drp] | OPHTHALMIC | Status: AC | PRN
  Administered 2022-07-27 (×3): 1 [drp] via OPHTHALMIC

## 2022-07-27 MED ORDER — LIDOCAINE HCL (PF) 1 % IJ SOLN
INTRAOCULAR | Status: DC | PRN
  Administered 2022-07-27: 1 mL via OPHTHALMIC

## 2022-07-27 MED ORDER — TETRACAINE HCL 0.5 % OP SOLN
1.0000 [drp] | OPHTHALMIC | Status: AC | PRN
  Administered 2022-07-27 (×3): 1 [drp] via OPHTHALMIC

## 2022-07-27 MED ORDER — MIDAZOLAM HCL 5 MG/5ML IJ SOLN
INTRAMUSCULAR | Status: DC | PRN
  Administered 2022-07-27: 1 mg via INTRAVENOUS

## 2022-07-27 MED ORDER — POVIDONE-IODINE 5 % OP SOLN
OPHTHALMIC | Status: DC | PRN
  Administered 2022-07-27: 1 via OPHTHALMIC

## 2022-07-27 MED ORDER — EPINEPHRINE PF 1 MG/ML IJ SOLN
INTRAOCULAR | Status: DC | PRN
  Administered 2022-07-27: 500 mL

## 2022-07-27 MED ORDER — CHLORHEXIDINE GLUCONATE 0.12 % MT SOLN: 15.0000 mL | Freq: Once | OROMUCOSAL | Status: DC

## 2022-07-27 MED ORDER — SODIUM CHLORIDE 0.9% FLUSH
INTRAVENOUS | Status: DC | PRN
  Administered 2022-07-27: 3 mL via INTRAVENOUS

## 2022-07-27 MED ORDER — DEXAMETHASONE 0.4 MG OP INST
VAGINAL_INSERT | OPHTHALMIC | Status: DC | PRN
  Administered 2022-07-27: .4 mg via OPHTHALMIC

## 2022-07-27 MED ORDER — SODIUM HYALURONATE 23MG/ML IO SOSY
PREFILLED_SYRINGE | INTRAOCULAR | Status: DC | PRN
  Administered 2022-07-27: .6 mL via INTRAOCULAR

## 2022-07-27 SURGICAL SUPPLY — 15 items
CATARACT SUITE SIGHTPATH (MISCELLANEOUS) ×1 IMPLANT
CLOTH BEACON ORANGE TIMEOUT ST (SAFETY) ×1 IMPLANT
EYE SHIELD UNIVERSAL CLEAR (GAUZE/BANDAGES/DRESSINGS) IMPLANT
FEE CATARACT SUITE SIGHTPATH (MISCELLANEOUS) ×1 IMPLANT
GLOVE BIOGEL PI IND STRL 7.0 (GLOVE) ×2 IMPLANT
LENS IOL RAYNER 23.5 (Intraocular Lens) ×1 IMPLANT
LENS IOL RAYONE EMV 23.5 (Intraocular Lens) IMPLANT
NDL HYPO 18GX1.5 BLUNT FILL (NEEDLE) ×1 IMPLANT
NEEDLE HYPO 18GX1.5 BLUNT FILL (NEEDLE) ×1 IMPLANT
PAD ARMBOARD 7.5X6 YLW CONV (MISCELLANEOUS) ×1 IMPLANT
RING MALYGIN (MISCELLANEOUS) IMPLANT
RING MALYGIN 7.0 (MISCELLANEOUS) IMPLANT
SYR TB 1ML LL NO SAFETY (SYRINGE) ×1 IMPLANT
TAPE SURG TRANSPORE 1 IN (GAUZE/BANDAGES/DRESSINGS) IMPLANT
WATER STERILE IRR 250ML POUR (IV SOLUTION) ×1 IMPLANT

## 2022-07-27 NOTE — Interval H&P Note (Signed)
History and Physical Interval Note:  07/27/2022 7:52 AM  Rhonda Garcia  has presented today for surgery, with the diagnosis of combined forms age related cataract; right.  The various methods of treatment have been discussed with the patient and family. After consideration of risks, benefits and other options for treatment, the patient has consented to  Procedure(s): CATARACT EXTRACTION PHACO AND INTRAOCULAR LENS PLACEMENT (IOC) with placement of Corticosteroid (Right) as a surgical intervention.  The patient's history has been reviewed, patient examined, no change in status, stable for surgery.  I have reviewed the patient's chart and labs.  Questions were answered to the patient's satisfaction.     [name]

## 2022-07-27 NOTE — Transfer of Care (Signed)
Immediate Anesthesia Transfer of Care Note  Patient: Rhonda Garcia  Procedure(s) Performed: CATARACT EXTRACTION PHACO AND INTRAOCULAR LENS PLACEMENT (IOC) with placement of Corticosteroid (Right: Eye)  Patient Location: Short Stay  Anesthesia Type:MAC  Level of Consciousness: awake, alert , and oriented  Airway & Oxygen Therapy: Patient Spontanous Breathing  Post-op Assessment: Report given to RN, Post -op Vital signs reviewed and stable, Patient moving all extremities X 4, and Patient able to stick tongue midline  Post vital signs: Reviewed  Last Vitals:  Vitals Value Taken Time  BP 136/58 07/27/22 0816  Temp 36.5 C 07/27/22 0816  Pulse 57 07/27/22 0816  Resp 15 07/27/22 0816  SpO2 99 % 07/27/22 0816    Last Pain:  Vitals:   07/27/22 0816  TempSrc:   PainSc: 0-No pain         Complications: No notable events documented.

## 2022-07-27 NOTE — Op Note (Signed)
Date of procedure: 07/27/22  Pre-operative diagnosis:  Visually significant combined form age-related cataract, Right Eye (H25.811)  Post-operative diagnosis:   1. Visually significant combined form age-related cataract, Right Eye (H25.811) 2. Pain and inflammation following cataract surgery Right Eye (H57.11)  Procedure:  Removal of cataract via phacoemulsification and insertion of intra-ocular lens Rayner [id number] +23.5D into the capsular bag of the Right Eye 2. Placement of Dextenza insert, Right Eye  Attending surgeon: Rudy Jew. Adilenne Ashworth, MD, MA  Anesthesia: MAC, Topical Akten  Complications: None  Estimated Blood Loss: <52mL (minimal)  Specimens: None  Implants: As above  Indications:  Visually significant age-related cataract, Right Eye  Procedure:  The patient was seen and identified in the pre-operative area. The operative eye was identified and dilated.  The operative eye was marked.  Topical anesthesia was administered to the operative eye.     The patient was then to the operative suite and placed in the supine position.  A timeout was performed confirming the patient, procedure to be performed, and all other relevant information.   The patient's face was prepped and draped in the usual fashion for intra-ocular surgery.  A lid speculum was placed into the operative eye and the surgical microscope moved into place and focused.  A superotemporal paracentesis was created using a 20 gauge paracentesis blade.  Shugarcaine was injected into the anterior chamber.  Viscoelastic was injected into the anterior chamber.  A temporal clear-corneal main wound incision was created using a 2.54mm microkeratome.  A continuous curvilinear capsulorrhexis was initiated using an irrigating cystitome and completed using capsulorrhexis forceps.  Hydrodissection and hydrodeliniation were performed.  Viscoelastic was injected into the anterior chamber.  A phacoemulsification handpiece and a chopper as a  second instrument were used to remove the nucleus and epinucleus. The irrigation/aspiration handpiece was used to remove any remaining cortical material.   The capsular bag was reinflated with viscoelastic, checked, and found to be intact.  The intraocular lens was inserted into the capsular bag.  The irrigation/aspiration handpiece was used to remove any remaining viscoelastic.  The clear corneal wound and paracentesis wounds were then hydrated and checked with Weck-Cels to be watertight. 0.16mL of moxifloxacin was injected into the anterior chamber.  The lid-speculum was removed. The lower punctum was dilated. A Dextenza implant was placed in the lower canaliculus without complication.  The drape was removed.  The patient's face was cleaned with a wet and dry 4x4. A clear shield was taped over the eye. The patient was taken to the post-operative care unit in good condition, having tolerated the procedure well.  Post-Op Instructions: The patient will follow up at [facility] for a same day post-operative evaluation and will receive all other orders and instructions.

## 2022-07-27 NOTE — Anesthesia Preprocedure Evaluation (Signed)
Anesthesia Evaluation  Patient identified by MRN, date of birth, ID band Patient awake    Reviewed: Allergy & Precautions, H&P , NPO status , Patient's Chart, lab work & pertinent test results, reviewed documented beta blocker date and time   Airway Mallampati: II  TM Distance: >3 FB Neck ROM: Full    Dental  (+) Dental Advisory Given, Upper Dentures, Edentulous Lower   Pulmonary sleep apnea    Pulmonary exam normal breath sounds clear to auscultation       Cardiovascular Exercise Tolerance: Good hypertension, Pt. on medications and Pt. on home beta blockers + angina  + CAD, + Past MI and + Cardiac Stents  Normal cardiovascular exam+ dysrhythmias Atrial Fibrillation  Rhythm:Regular Rate:Normal     Neuro/Psych  PSYCHIATRIC DISORDERS Anxiety Depression     Neuromuscular disease    GI/Hepatic Neg liver ROS, hiatal hernia,GERD  Medicated,,  Endo/Other  diabetes, Well Controlled, Type 2, Oral Hypoglycemic AgentsHypothyroidism    Renal/GU negative Renal ROS  negative genitourinary   Musculoskeletal  (+) Arthritis , Osteoarthritis,    Abdominal   Peds negative pediatric ROS (+)  Hematology  (+) Blood dyscrasia, anemia   Anesthesia Other Findings Vertigo   Reproductive/Obstetrics negative OB ROS                             Anesthesia Physical Anesthesia Plan  ASA: 3  Anesthesia Plan: MAC   Post-op Pain Management: Minimal or no pain anticipated   Induction: Intravenous  PONV Risk Score and Plan: Treatment may vary due to age or medical condition  Airway Management Planned: Nasal Cannula and Natural Airway  Additional Equipment:   Intra-op Plan:   Post-operative Plan:   Informed Consent: I have reviewed the patients History and Physical, chart, labs and discussed the procedure including the risks, benefits and alternatives for the proposed anesthesia with the patient or authorized  representative who has indicated his/her understanding and acceptance.     Dental advisory given  Plan Discussed with: CRNA and Surgeon  Anesthesia Plan Comments:        Anesthesia Quick Evaluation

## 2022-07-27 NOTE — Anesthesia Postprocedure Evaluation (Signed)
Anesthesia Post Note  Patient: Rhonda Garcia  Procedure(s) Performed: CATARACT EXTRACTION PHACO AND INTRAOCULAR LENS PLACEMENT (IOC) with placement of Corticosteroid (Right: Eye)  Patient location during evaluation: PACU Anesthesia Type: MAC Level of consciousness: awake and alert and oriented Pain management: pain level controlled Vital Signs Assessment: post-procedure vital signs reviewed and stable Respiratory status: spontaneous breathing, nonlabored ventilation and respiratory function stable Cardiovascular status: blood pressure returned to baseline and stable Postop Assessment: no apparent nausea or vomiting Anesthetic complications: no  No notable events documented.   Last Vitals:  Vitals:   07/27/22 0713 07/27/22 0816  BP: 104/81 (!) 136/58  Pulse: 62 (!) 57  Resp: 17 15  Temp: 36.6 C 36.5 C  SpO2: 98% 99%    Last Pain:  Vitals:   07/27/22 0816  TempSrc:   PainSc: 0-No pain                 Antanasia Kaczynski C Copeland Lapier

## 2022-07-27 NOTE — Discharge Instructions (Signed)
Please discharge patient when stable, will follow up today with Dr. Cormac Wint at the [facility] office immediately following discharge.  Leave shield in place until visit.  All paperwork with discharge instructions will be given at the office.  [facility]  Address:  [address]  , [place] [address]  

## 2022-08-13 ENCOUNTER — Telehealth: Payer: Self-pay | Admitting: Family

## 2022-08-13 NOTE — Telephone Encounter (Signed)
Pt called stating that she has a yeast infection and bled a little bit when she went to wipe. Wants to know if Neysa Bonito will work her in. Says Neysa Bonito told her the last time this happened that if it happened again to call and let her know and she could work her in.

## 2022-08-13 NOTE — Telephone Encounter (Signed)
Appt made patient aware  °

## 2022-08-14 ENCOUNTER — Ambulatory Visit (INDEPENDENT_AMBULATORY_CARE_PROVIDER_SITE_OTHER): Payer: Medicare Other | Admitting: Family

## 2022-08-14 ENCOUNTER — Encounter: Payer: Self-pay | Admitting: Family

## 2022-08-14 VITALS — BP 128/76 | HR 78 | Temp 97.3°F | Ht 62.0 in | Wt 164.0 lb

## 2022-08-14 DIAGNOSIS — I152 Hypertension secondary to endocrine disorders: Secondary | ICD-10-CM | POA: Diagnosis not present

## 2022-08-14 DIAGNOSIS — E1169 Type 2 diabetes mellitus with other specified complication: Secondary | ICD-10-CM

## 2022-08-14 DIAGNOSIS — Z7984 Long term (current) use of oral hypoglycemic drugs: Secondary | ICD-10-CM

## 2022-08-14 DIAGNOSIS — N760 Acute vaginitis: Secondary | ICD-10-CM

## 2022-08-14 DIAGNOSIS — E1159 Type 2 diabetes mellitus with other circulatory complications: Secondary | ICD-10-CM

## 2022-08-14 LAB — WET PREP FOR TRICH, YEAST, CLUE
Clue Cell Exam: NEGATIVE
Trichomonas Exam: NEGATIVE
Yeast Exam: POSITIVE — AB

## 2022-08-14 MED ORDER — FLUCONAZOLE 150 MG PO TABS
150.0000 mg | ORAL_TABLET | ORAL | 2 refills | Status: DC | PRN
Start: 2022-08-14 — End: 2022-09-13

## 2022-08-14 MED ORDER — NYSTATIN 100000 UNIT/GM EX CREA
1.0000 | TOPICAL_CREAM | Freq: Two times a day (BID) | CUTANEOUS | 2 refills | Status: DC
Start: 2022-08-14 — End: 2023-06-17

## 2022-08-14 NOTE — Progress Notes (Signed)
Subjective:    Patient ID: Rhonda Garcia, female    DOB: 1940/05/02, 82 y.o.   MRN: [id number]  Chief Complaint  Patient presents with   Groin Swelling    Started few weeks after starting jaurdance. Going on for 3 months and is spreading to rectum   Vaginal Itching    Started few weeks after starting jaurdance. Going on for 3 months and is spreading to rectum   Anal Itching    Started few weeks after starting jaurdance. Going on for 3 months and is spreading to rectum   Genia Hotter a week ago 08/06/22. Left sided pain from fall. Had headache for 2 weeks before fall thinks this is what caused the fall did not trip   Breast Pain    Started after the fall    Vaginal Itching The patient's primary symptoms include genital itching. The patient's pertinent negatives include no genital odor, vaginal bleeding or vaginal discharge. This is a new problem. The current episode started more than 1 month ago. The pain is mild.  Fall  Diabetes She presents for her follow-up diabetic visit. She has type 2 diabetes mellitus. Associated symptoms include foot paresthesias. Pertinent negatives for diabetes include no blurred vision. Symptoms are stable. Risk factors for coronary artery disease include dyslipidemia, diabetes mellitus, hypertension and sedentary lifestyle. She is following a generally healthy diet. (Does not check BS at home)  Hypertension This is a chronic problem. The current episode started more than 1 year ago. The problem has been resolved since onset. The problem is controlled. Associated symptoms include malaise/fatigue. Pertinent negatives include no blurred vision, peripheral edema or shortness of breath. Risk factors for coronary artery disease include dyslipidemia, diabetes mellitus, obesity and sedentary lifestyle. The current treatment provides moderate improvement.      Review of Systems  Constitutional:  Positive for malaise/fatigue.  Eyes:  Negative for blurred  vision.  Respiratory:  Negative for shortness of breath.   Genitourinary:  Negative for vaginal discharge.  All other systems reviewed and are negative.      Objective:   Physical Exam Vitals reviewed.  Constitutional:      General: She is not in acute distress.    Appearance: She is well-developed. She is obese.  HENT:     Head: Normocephalic and atraumatic.  Eyes:     Pupils: Pupils are equal, round, and reactive to light.  Neck:     Thyroid: No thyromegaly.  Cardiovascular:     Rate and Rhythm: Normal rate and regular rhythm.     Heart sounds: Normal heart sounds. No murmur heard. Pulmonary:     Effort: Pulmonary effort is normal. No respiratory distress.     Breath sounds: Normal breath sounds. No wheezing.  Abdominal:     General: Bowel sounds are normal. There is no distension.     Palpations: Abdomen is soft.     Tenderness: There is no abdominal tenderness.  Musculoskeletal:        General: No tenderness. Normal range of motion.     Cervical back: Normal range of motion and neck supple.  Skin:    General: Skin is warm and dry.  Neurological:     Mental Status: She is alert and oriented to person, place, and time.     Cranial Nerves: No cranial nerve deficit.     Deep Tendon Reflexes: Reflexes are normal and symmetric.  Psychiatric:        Behavior: Behavior normal.  Thought Content: Thought content normal.        Judgment: Judgment normal.         BP 128/76   Pulse 78   Temp (!) 97.3 F (36.3 C) (Temporal)   Ht 5\' 2"  (1.575 m)   Wt 164 lb (74.4 kg)   SpO2 96%   BMI 30.00 kg/m   Assessment & Plan:  Rhonda Garcia comes in today with chief complaint of Groin Swelling (Started few weeks after starting jaurdance. Going on for 3 months and is spreading to rectum), Vaginal Itching (Started few weeks after starting jaurdance. Going on for 3 months and is spreading to rectum), Anal Itching (Started few weeks after starting jaurdance. Going on for 3  months and is spreading to rectum), Fall [name] a week ago 08/06/22. Left sided pain from fall. Had headache for 2 weeks before fall thinks this is what caused the fall did not trip), and Breast Pain (Started after the fall)   Diagnosis and orders addressed:  1. Acute vaginitis Start diflucan every 3 days Started after Farxiga, if continues may need to switch medications  Keep clean and dry Probiotic  - WET PREP FOR TRICH, YEAST, CLUE - fluconazole (DIFLUCAN) 150 MG tablet; Take 1 tablet (150 mg total) by mouth every three (3) days as needed.  Dispense: 3 tablet; Refill: 2 - nystatin cream (MYCOSTATIN); Apply 1 Application topically 2 (two) times daily.  Dispense: 60 g; Refill: 2  2. Type 2 diabetes mellitus with other specified complication, without long-term current use of insulin (HCC) Farxiga causing yeast, may need to change if continues  Low carb diet  - Microalbumin / creatinine urine ratio  3. Hypertension associated with diabetes (HCC)   Labs pending Health Maintenance reviewed Diet and exercise encouraged  Follow up plan: 1 month    Jannifer Rodney, FNP

## 2022-08-14 NOTE — Addendum Note (Signed)
Addended by: Daisy Blossom on: 08/14/2022 12:18 PM   Modules accepted: Orders

## 2022-08-14 NOTE — Patient Instructions (Signed)

## 2022-08-16 LAB — MICROALBUMIN / CREATININE URINE RATIO
Creatinine, Urine: 48.8 mg/dL
Microalb/Creat Ratio: 10 mg/g creat (ref 0–29)
Microalbumin, Urine: 4.7 ug/mL

## 2022-09-02 NOTE — Progress Notes (Signed)
Primary Care Physician:  Junie Spencer, FNP Primary Gastroenterologist:  Dr. Jena Gauss  No chief complaint on file.   HPI:   Rhonda Garcia is a 82 y.o. female presenting today with chief complaint of ***  She has chronic history of IBS-C.  Colonoscopy up-to-date 10/02/2018 with diverticulosis, two 4-5 mm polyps that were tubular adenoma and recommended no future colonoscopy unless new symptoms develop, due to age.  We have not seen patient since January 2021.   Today:   Past Medical History:  Diagnosis Date   A-fib (HCC)    Anal fissure    resolved    Anemia    Back pain    with left radiculopathy   CAD (coronary artery disease)    Cataract    Collagen vascular disease (HCC)    DDD (degenerative disc disease)    Depression    Diabetes mellitus    Diverticulosis    Dyslipidemia    Dysrhythmia    AFib   Esophagitis    GERD (gastroesophageal reflux disease)    Hiatal hernia    Hx of peptic ulcer    Hyperlipidemia    Hypertension    Hypothyroidism    Internal hemorrhoids 2017   NSTEMI (non-ST elevated myocardial infarction) (HCC) 08/05/2015   Osteopenia    Sleep apnea    not using CPAP now, could not tolerate and PCP aware.   Thyroid disease     Past Surgical History:  Procedure Laterality Date   ABDOMINAL HYSTERECTOMY Bilateral 2001 - approximate   APPENDECTOMY     BREAST REDUCTION SURGERY     CARDIAC CATHETERIZATION N/A 08/08/2015   Procedure: Left Heart Cath and Coronary Angiography;  Surgeon: Kathleene Hazel, MD;  Location: [facility] INVASIVE CV LAB;  Service: Cardiovascular;  Laterality: N/A;   CHOLECYSTECTOMY  2008 - approximate   COLONOSCOPY  06/2007   Friable anal canal and anal papillae. Pancolonic diverticula. Normal terminal ileum. Status post segmental biopsy, descending colon biopsy showed minimal cryptitis. Biopsies felt to be  nonspecific but could be seen with NSAIDs. No features of inflammatory bowel disease. Stool studies were negative.    COLONOSCOPY  03/21/2011   RMR: colonic polyps treated as described above, colonic diverticulosis (Tubular adenomas)   COLONOSCOPY WITH PROPOFOL N/A 06/17/2014   RMR: Colonic diverticulosis. Multiple colonic polyps removed as described above.    COLONOSCOPY WITH PROPOFOL N/A 03/19/2016   Procedure: COLONOSCOPY WITH PROPOFOL;  Surgeon: Corbin Ade, MD;  Location: [facility];  Service: Endoscopy;  Laterality: N/A;  8:45am   COLONOSCOPY WITH PROPOFOL N/A 10/02/2018   Procedure: COLONOSCOPY WITH PROPOFOL;  Surgeon: Corbin Ade, MD;  Location: [facility];  Service: Endoscopy;  Laterality: N/A;  9:15am   CORONARY ANGIOPLASTY WITH STENT PLACEMENT     4 stents   ESOPHAGEAL DILATION N/A 06/17/2014   Procedure: ESOPHAGEAL DILATION;  Surgeon: Corbin Ade, MD;  Location: [facility];  Service: Endoscopy;  Laterality: N/A;  Maloney 54 Fr   ESOPHAGOGASTRODUODENOSCOPY  06/2007   Small hiatal hernia   ESOPHAGOGASTRODUODENOSCOPY  03/21/2011   RMR: normal esophagus- status post passage of Maloney dilator. Small hiatal hernia. Trivial antral and bulbar erosions   ESOPHAGOGASTRODUODENOSCOPY (EGD) WITH PROPOFOL N/A 06/17/2014   RMR: Small hiatal hernia; otherwise normal EGD. Status post passage of a Maloney dilator. No explanation for patients right upper quadrant abdominal pain.    ESOPHAGOGASTRODUODENOSCOPY (EGD) WITH PROPOFOL N/A 03/19/2016   Procedure: ESOPHAGOGASTRODUODENOSCOPY (EGD) WITH PROPOFOL;  Surgeon: Gerrit Friends  Rourk, MD;  Location: [facility];  Service: Endoscopy;  Laterality: N/A;   KNEE SURGERY Left    arthroscopy   LIPOMA EXCISION  03/17/2012   Procedure: EXCISION LIPOMA;  Surgeon: Cherylynn Ridges, MD;  Location: [facility];  Service: General;  Laterality: Right;  excision of right lower back lipoma   MALONEY DILATION N/A 03/19/2016   Procedure: Elease Hashimoto DILATION;  Surgeon: Corbin Ade, MD;  Location: [facility];  Service: Endoscopy;  Laterality: N/A;   POLYPECTOMY   06/17/2014   Procedure: POLYPECTOMY;  Surgeon: Corbin Ade, MD;  Location: [facility];  Service: Endoscopy;;   POLYPECTOMY  10/02/2018   Procedure: POLYPECTOMY;  Surgeon: Corbin Ade, MD;  Location: [facility];  Service: Endoscopy;;  colon   REDUCTION MAMMAPLASTY Bilateral     Current Outpatient Medications  Medication Sig Dispense Refill   ALPRAZolam (XANAX) 0.5 MG tablet Take 1 tablet (0.5 mg total) by mouth daily as needed. 20 tablet 2   blood glucose meter kit and supplies Dispense based on patient and insurance preference. Use up to four times daily as directed. (FOR ICD-10 E10.9, E11.9). 1 each 0   busPIRone (BUSPAR) 5 MG tablet TAKE ONE (1) TABLET THREE (3) TIMES EACH DAY 90 tablet 1   clobetasol cream (TEMOVATE) 0.05 % Apply 1 Application topically 2 (two) times daily. 60 g 2   dapagliflozin propanediol (FARXIGA) 5 MG TABS tablet Take 1 tablet (5 mg total) by mouth daily before breakfast. 90 tablet 1   ELIQUIS 5 MG TABS tablet TAKE ONE TABLET BY MOUTH TWICE DAILY 60 tablet 5   escitalopram (LEXAPRO) 10 MG tablet Take 1 tablet (10 mg total) by mouth daily. 90 tablet 3   fluconazole (DIFLUCAN) 150 MG tablet Take 1 tablet (150 mg total) by mouth every three (3) days as needed. 3 tablet 2   glucose blood (ONETOUCH VERIO) test strip Test BS QID Dx E11.9 400 each 3   hydroxychloroquine (PLAQUENIL) 200 MG tablet TAKE ONE (1) TABLET BY MOUTH EVERY DAY 30 tablet 1   isosorbide mononitrate (IMDUR) 60 MG 24 hr tablet TAKE ONE (1) TABLET EACH DAY 90 tablet 1   Lancets (ONETOUCH DELICA PLUS LANCET33G) MISC Test BS QID Dx E11.9 400 each 3   levothyroxine (SYNTHROID) 50 MCG tablet TAKE ONE (1) TABLET EACH DAY 90 tablet 3   linaclotide (LINZESS) 72 MCG capsule Take 1 capsule (72 mcg total) by mouth daily before breakfast. 90 capsule 1   lisinopril (ZESTRIL) 20 MG tablet Take 1 tablet (20 mg total) by mouth daily. 90 tablet 3   metFORMIN (GLUCOPHAGE) 1000 MG tablet TAKE 1 TABLET BY MOUTH TWICE  DAILY WITH MEALS 180 tablet 0   metoprolol tartrate (LOPRESSOR) 50 MG tablet TAKE ONE TABLET BY MOUTH TWICE DAILY 180 tablet 0   nitroGLYCERIN (NITROSTAT) 0.4 MG SL tablet DISSOLVE 1 TAB UNDER TOUNGE FOR CHEST PAIN. MAY REPEAT EVERY 5 MINUTES FOR 3 DOSES. IF NO RELIEF CALL 911 OR GO TO ER 25 tablet 1   nystatin cream (MYCOSTATIN) Apply 1 Application topically 2 (two) times daily. 60 g 2   ondansetron (ZOFRAN) 4 MG tablet TAKE ONE TABLET BY MOUTH EVERY EIGHT HOURS AS NEEDED 30 tablet 2   pantoprazole (PROTONIX) 40 MG tablet TAKE ONE TABLET BY MOUTH TWICE DAILY 180 tablet 0   pravastatin (PRAVACHOL) 80 MG tablet TAKE 1 TABLET DAILY IN THE EVENING 90 tablet 0   traMADol (ULTRAM) 50 MG tablet Take 1  tablet (50 mg total) by mouth every 8 (eight) hours as needed. 20 tablet 2   No current facility-administered medications for this visit.    Allergies as of 09/05/2022 - Review Complete 08/14/2022  Allergen Reaction Noted   Fentanyl Anaphylaxis 12/10/2020   Promethazine hcl Anaphylaxis 09/06/2010    Family History  Problem Relation Age of Onset   Heart attack Mother    Hypertension Mother    Stroke Mother        Deceased, age 22   Diabetes Sister    Arthritis Sister    Diabetes Sister    Cancer Sister        ovarian   Stroke Sister    Alzheimer's disease Sister    Diabetes Brother        also has a blood disorder    Clotting disorder Brother    Kidney disease Brother    Heart disease Brother    Melanoma Brother        deceased   Diabetes Son    Hypertension Son    Coronary artery disease Son 75       CABG   Stroke Son    Hypertension Son    Coronary artery disease Son 70       CABG   Colon cancer Neg Hx     Social History   Socioeconomic History   Marital status: Married    Spouse name: Not on file   Number of children: 4   Years of education: 6   Highest education level: 6th grade  Occupational History   Occupation: Retired    Comment: Engineer, site  Tobacco Use    Smoking status: Never   Smokeless tobacco: Never  Building services engineer Use: Never used  Substance and Sexual Activity   Alcohol use: Not Currently    Alcohol/week: 1.0 standard drink of alcohol    Types: 1 Glasses of wine per week   Drug use: No   Sexual activity: Not Currently    Birth control/protection: Post-menopausal  Other Topics Concern   Not on file  Social History Narrative    Has 4 adult children. She lives home with one of her sons who recently had a stroke and needs 24 hour care. She is the primary caregiver and is now legally separated from her husband.    Social Determinants of Health   Financial Resource Strain: Low Risk  (07/24/2022)   Overall Financial Resource Strain (CARDIA)    Difficulty of Paying Living Expenses: Not hard at all  Food Insecurity: No Food Insecurity (07/24/2022)   Hunger Vital Sign    Worried About Running Out of Food in the Last Year: Never true    Ran Out of Food in the Last Year: Never true  Transportation Needs: No Transportation Needs (07/24/2022)   PRAPARE - Administrator, Civil Service (Medical): No    Lack of Transportation (Non-Medical): No  Physical Activity: Insufficiently Active (07/24/2022)   Exercise Vital Sign    Days of Exercise per Week: 1 day    Minutes of Exercise per Session: 30 min  Stress: No Stress Concern Present (07/24/2022)   [name] of Occupational Health - Occupational Stress Questionnaire    Feeling of Stress : Not at all  Social Connections: Moderately Integrated (07/24/2022)   Social Connection and Isolation Panel [NHANES]    Frequency of Communication with Friends and Family: More than three times a week    Frequency of Social Gatherings  with Friends and Family: More than three times a week    Attends Religious Services: More than 4 times per year    Active Member of Clubs or Organizations: No    Attends Banker Meetings: Never    Marital Status: Married  Catering manager  Violence: Not At Risk (07/24/2022)   Humiliation, Afraid, Rape, and Kick questionnaire    Fear of Current or Ex-Partner: No    Emotionally Abused: No    Physically Abused: No    Sexually Abused: No    Review of Systems: Gen: Denies any fever, chills, fatigue, weight loss, lack of appetite.  CV: Denies chest pain, heart palpitations, peripheral edema, syncope.  Resp: Denies shortness of breath at rest or with exertion. Denies wheezing or cough.  GI: Denies dysphagia or odynophagia. Denies jaundice, hematemesis, fecal incontinence. GU : Denies urinary burning, urinary frequency, urinary hesitancy MS: Denies joint pain, muscle weakness, cramps, or limitation of movement.  Derm: Denies rash, itching, dry skin Psych: Denies depression, anxiety, memory loss, and confusion Heme: Denies bruising, bleeding, and enlarged lymph nodes.  Physical Exam: There were no vitals taken for this visit. General:   Alert and oriented. Pleasant and cooperative. Well-nourished and well-developed.  Head:  Normocephalic and atraumatic. Eyes:  Without icterus, sclera clear and conjunctiva pink.  Ears:  Normal auditory acuity. Lungs:  Clear to auscultation bilaterally. No wheezes, rales, or rhonchi. No distress.  Heart:  S1, S2 present without murmurs appreciated.  Abdomen:  +BS, soft, non-tender and non-distended. No HSM noted. No guarding or rebound. No masses appreciated.  Rectal:  Deferred  Msk:  Symmetrical without gross deformities. Normal posture. Extremities:  Without edema. Neurologic:  Alert and  oriented x4;  grossly normal neurologically. Skin:  Intact without significant lesions or rashes. Psych:  Alert and cooperative. Normal mood and affect.    Assessment:     Plan:  ***   Ermalinda Memos, PA-C [facility] 09/05/2022

## 2022-09-05 ENCOUNTER — Telehealth: Payer: Self-pay | Admitting: *Deleted

## 2022-09-05 ENCOUNTER — Ambulatory Visit (INDEPENDENT_AMBULATORY_CARE_PROVIDER_SITE_OTHER): Payer: Medicare Other | Admitting: Gastroenterology

## 2022-09-05 ENCOUNTER — Encounter: Payer: Self-pay | Admitting: Gastroenterology

## 2022-09-05 VITALS — BP 137/70 | HR 67 | Temp 97.9°F | Ht 62.0 in | Wt 166.8 lb

## 2022-09-05 DIAGNOSIS — K5900 Constipation, unspecified: Secondary | ICD-10-CM

## 2022-09-05 DIAGNOSIS — R630 Anorexia: Secondary | ICD-10-CM | POA: Diagnosis not present

## 2022-09-05 DIAGNOSIS — R195 Other fecal abnormalities: Secondary | ICD-10-CM | POA: Insufficient documentation

## 2022-09-05 DIAGNOSIS — R1310 Dysphagia, unspecified: Secondary | ICD-10-CM | POA: Insufficient documentation

## 2022-09-05 NOTE — Telephone Encounter (Signed)
Pharmacy please advise on holding Eliquis prior to colonoscopy and EGD scheduled for TBD. Thank you.   

## 2022-09-05 NOTE — Telephone Encounter (Signed)
   Patient Name: Rhonda Garcia  DOB: 07-05-1940 MRN: [id number]  Primary Cardiologist: Rollene Rotunda, MD  Clinical pharmacists have reviewed the patient's past medical history, labs, and current medications as part of preoperative protocol coverage. The following recommendations have been made:   Per office protocol, patient can hold Eliquis for 2 days prior to procedure    I will route this recommendation to the requesting party via Epic fax function and remove from pre-op pool.  Please call with questions.  Napoleon Form, Leodis Rains, NP 09/05/2022, 10:25 AM

## 2022-09-05 NOTE — Patient Instructions (Addendum)
We will schedule for a colonoscopy and upper endoscopy with possible dilation of your esophagus in the near future.  We will reach out to your cardiologist to get approval to hold Eliquis for 2 days prior to your procedure. You will also need to hold Farxiga x 3 days prior to your procedure.  Take your new prescription of Linzess 72 mcg daily and let me know next week if your bowel function normalizes. If not, we can increase the dose.  When you call back next week with a progress report, please let us know the new pharmacy that we would need to send any prescriptions to.  Swallowing precautions:  Eat slowly, take small bites, chew thoroughly, drink plenty of liquids throughout meals.  Avoid trough textures All meats should be chopped finely.  If something gets hung in your esophagus and will not come up or go down, proceed to the emergency room.    We will see see you back after your procedures.  Do not hesitate to call if you have questions or concerns prior to your next visit.  It was very nice to meet you today!  Ermalinda Memos, PA-C [facility]

## 2022-09-05 NOTE — Telephone Encounter (Signed)
Patient with diagnosis of A fib on Eliquis for anticoagulation.    Procedure: Colonoscopy/Esophagogastroduodenoscopy (EGD)  with possible esophageal dilation  Date of procedure: TBD   CHA2DS2-VASc Score = 6  This indicates a 9.7% annual risk of stroke. The patient's score is based upon: CHF History: 0 HTN History: 1 Diabetes History: 1 Stroke History: 0 Vascular Disease History: 1 Age Score: 2 Gender Score: 1   CrCl 45 mL/min Platelet count 113K   Per office protocol, patient can hold Eliquis for 2 days prior to procedure.    **This guidance is not considered finalized until pre-operative APP has relayed final recommendations.**

## 2022-09-05 NOTE — Telephone Encounter (Signed)
OK given to hold Eliquis x 2 days. Please proceed with scheduling when able.

## 2022-09-05 NOTE — Telephone Encounter (Signed)
Provider is booking into July. Please advise. Thank you

## 2022-09-05 NOTE — Telephone Encounter (Signed)
  Request for patient to stop medication prior to procedure or is needing cleareance  09/05/22  Rhonda Garcia [birth year]  What type of surgery is being performed? Colonoscopy/Esophagogastroduodenoscopy (EGD)  with possible esophageal dilation  When is surgery scheduled? TBD  What type of clearance is required (medical or pharmacy to hold medication or both? medication  Are there any medications that need to be held prior to surgery and how long? Eliquis x 2 days  Name of physician performing surgery?  [facility] at [employer]: [phone] Fax: [phone]  Anethesia type (none, local, MAC, general)? MAC

## 2022-09-06 NOTE — Telephone Encounter (Signed)
Noted  

## 2022-09-10 ENCOUNTER — Encounter: Payer: Self-pay | Admitting: *Deleted

## 2022-09-10 ENCOUNTER — Other Ambulatory Visit: Payer: Self-pay | Admitting: *Deleted

## 2022-09-10 MED ORDER — PEG 3350-KCL-NA BICARB-NACL 420 G PO SOLR: 4000.0000 mL | Freq: Once | ORAL | 0 refills | Status: AC

## 2022-09-10 NOTE — Telephone Encounter (Signed)
Pt has been scheduled for 10/12/22. Instructions mailed and prep sent to the pharmacy.

## 2022-09-11 ENCOUNTER — Telehealth: Payer: Self-pay | Admitting: Family

## 2022-09-11 NOTE — Telephone Encounter (Signed)
Pt called stating that she is having neck pain. Sounds like she has a crick in her neck based on what she is saying. She has an appt scheduled for this Friday. Called to see if Neysa Bonito had any openings before Friday. Explained to pt that she does not but that I could get her in sooner with a different provider if she couldn't wait until Friday. Pt declined. Says she only wants to see Neysa Bonito and says we should really document in her chart that Neysa Bonito told her that if she ever needed to be seen right away, that Neysa Bonito would work her in, because she has to tell us this everytime she calls.

## 2022-09-11 NOTE — Telephone Encounter (Signed)
APPT MADE

## 2022-09-13 ENCOUNTER — Encounter: Payer: Self-pay | Admitting: Family

## 2022-09-13 ENCOUNTER — Ambulatory Visit (INDEPENDENT_AMBULATORY_CARE_PROVIDER_SITE_OTHER): Payer: Medicare Other | Admitting: Family

## 2022-09-13 VITALS — BP 132/77 | HR 72 | Temp 97.7°F | Ht 62.0 in | Wt 164.4 lb

## 2022-09-13 DIAGNOSIS — I252 Old myocardial infarction: Secondary | ICD-10-CM

## 2022-09-13 DIAGNOSIS — Z0001 Encounter for general adult medical examination with abnormal findings: Secondary | ICD-10-CM | POA: Diagnosis not present

## 2022-09-13 DIAGNOSIS — M159 Polyosteoarthritis, unspecified: Secondary | ICD-10-CM

## 2022-09-13 DIAGNOSIS — M5430 Sciatica, unspecified side: Secondary | ICD-10-CM | POA: Diagnosis not present

## 2022-09-13 DIAGNOSIS — K219 Gastro-esophageal reflux disease without esophagitis: Secondary | ICD-10-CM

## 2022-09-13 DIAGNOSIS — E1169 Type 2 diabetes mellitus with other specified complication: Secondary | ICD-10-CM

## 2022-09-13 DIAGNOSIS — F411 Generalized anxiety disorder: Secondary | ICD-10-CM

## 2022-09-13 DIAGNOSIS — K5900 Constipation, unspecified: Secondary | ICD-10-CM

## 2022-09-13 DIAGNOSIS — M170 Bilateral primary osteoarthritis of knee: Secondary | ICD-10-CM

## 2022-09-13 DIAGNOSIS — I152 Hypertension secondary to endocrine disorders: Secondary | ICD-10-CM

## 2022-09-13 DIAGNOSIS — M542 Cervicalgia: Secondary | ICD-10-CM

## 2022-09-13 DIAGNOSIS — Z0000 Encounter for general adult medical examination without abnormal findings: Secondary | ICD-10-CM

## 2022-09-13 DIAGNOSIS — F331 Major depressive disorder, recurrent, moderate: Secondary | ICD-10-CM

## 2022-09-13 DIAGNOSIS — I25110 Atherosclerotic heart disease of native coronary artery with unstable angina pectoris: Secondary | ICD-10-CM | POA: Diagnosis not present

## 2022-09-13 DIAGNOSIS — M1990 Unspecified osteoarthritis, unspecified site: Secondary | ICD-10-CM

## 2022-09-13 DIAGNOSIS — E1159 Type 2 diabetes mellitus with other circulatory complications: Secondary | ICD-10-CM

## 2022-09-13 DIAGNOSIS — E039 Hypothyroidism, unspecified: Secondary | ICD-10-CM

## 2022-09-13 DIAGNOSIS — M549 Dorsalgia, unspecified: Secondary | ICD-10-CM

## 2022-09-13 LAB — CBC WITH DIFFERENTIAL/PLATELET
EOS (ABSOLUTE): 0.1 10*3/uL (ref 0.0–0.4)
Immature Granulocytes: 0 %
Neutrophils Absolute: 3 10*3/uL (ref 1.4–7.0)
Neutrophils: 66 %
Platelets: 132 10*3/uL — ABNORMAL LOW (ref 150–450)

## 2022-09-13 LAB — LIPID PANEL
Cholesterol, Total: 121 mg/dL (ref 100–199)
Triglycerides: 92 mg/dL (ref 0–149)

## 2022-09-13 LAB — BAYER DCA HB A1C WAIVED: HB A1C (BAYER DCA - WAIVED): 9.1 % — ABNORMAL HIGH (ref 4.8–5.6)

## 2022-09-13 LAB — CMP14+EGFR
AST: 20 IU/L (ref 0–40)
Albumin: 4 g/dL (ref 3.7–4.7)
BUN/Creatinine Ratio: 15 (ref 12–28)
BUN: 17 mg/dL (ref 8–27)
Bilirubin Total: 0.7 mg/dL (ref 0.0–1.2)
CO2: 25 mmol/L (ref 20–29)
Creatinine, Ser: 1.13 mg/dL — ABNORMAL HIGH (ref 0.57–1.00)

## 2022-09-13 MED ORDER — DAPAGLIFLOZIN PROPANEDIOL 10 MG PO TABS
10.0000 mg | ORAL_TABLET | Freq: Every day | ORAL | 2 refills | Status: DC
Start: 2022-09-13 — End: 2022-11-01

## 2022-09-13 MED ORDER — BACLOFEN 5 MG PO TABS
5.0000 mg | ORAL_TABLET | Freq: Three times a day (TID) | ORAL | 0 refills | Status: DC | PRN
Start: 2022-09-13 — End: 2023-06-17

## 2022-09-13 MED ORDER — TRAMADOL HCL 50 MG PO TABS
50.0000 mg | ORAL_TABLET | Freq: Three times a day (TID) | ORAL | 2 refills | Status: DC | PRN
Start: 2022-09-13 — End: 2022-12-14

## 2022-09-13 MED ORDER — ALPRAZOLAM 0.5 MG PO TABS
0.5000 mg | ORAL_TABLET | Freq: Every day | ORAL | 2 refills | Status: DC | PRN
Start: 2022-09-13 — End: 2022-12-14

## 2022-09-13 NOTE — Patient Instructions (Signed)
Cervical Sprain A cervical sprain is a stretch or tear in one or more of the ligaments in the neck. Ligaments are the tissues that connect bones to each other. Cervical sprains can range from mild to severe. Severe cervical sprains can cause the spinal bones (vertebrae) in the neck to be unstable. This can result in spinal cord damage and serious nervous system problems. Healing time for a cervical sprain depends on the cause and extent of the injury. Most cervical sprains heal in 4-6 weeks. What are the causes? Cervical sprains may be caused by trauma, such as an injury from a motor vehicle accident, a fall, or a sudden forward and backward whipping movement of the head and neck (whiplash injury). Mild cervical sprains may be caused by wear and tear over time. What increases the risk? You are more likely to get a cervical sprain if: You take part in activities that have a high risk of trauma to the neck. These include contact sports, gymnastics, and diving. You have: Osteoarthritis of the spine. Poor strength and flexibility of the neck. Poor posture. You have had a neck injury in the past. You spend long periods in positions that put stress on the neck, such as sitting at a computer. What are the signs or symptoms? Symptoms of this condition include: Any of these problems in the neck, shoulders, or upper back: Pain or tenderness. Stiffness. Swelling. A burning feeling. Sudden tightening of neck muscles (spasms). Limited ability to move the neck. Headache. Dizziness. Nausea or vomiting. Weakness, numbness, or tingling in a hand or an arm. Symptoms may develop right away after injury or may develop over a few days. In some cases, symptoms may go away with treatment and return (recur) over time. How is this diagnosed? This condition may be diagnosed based on: Your symptoms, medical history, and a physical exam. Any recent injuries or known neck problems that you have, such as arthritis  in the neck. Imaging tests, such as X-rays, an MRI, or a CT scan. How is this treated? This condition is treated by resting and icing the injured area and doing physical therapy exercises to improve movement and strength. Heat therapy may be used 2-3 days after the injury if there is no swelling. Depending on the severity of your condition, treatment may also include: Keeping your neck in place (immobilized) for periods of time. This may be done using: A cervical collar. This supports your chin and the back of your head. A cervical traction device. This is a sling that holds up your head. It removes weight and pressure from your neck. Medicines for pain or other symptoms. Surgery. This is rare. Follow these instructions at home: Medicines Take over-the-counter and prescription medicines only as told by your health care provider. Ask your provider if the medicine prescribed to you: Requires you to avoid driving or using machinery. Can cause constipation. You may need to take these actions to prevent or treat constipation: Drink enough fluid to keep your pee pale yellow. Take over-the-counter or prescription medicines. Eat foods that are high in fiber, such as beans, whole grains, and fresh fruits and vegetables. Limit foods that are high in fat and processed sugars, such as fried or sweet foods. If you have a cervical collar: Wear the collar as told by your provider. Do not remove it unless told. Ask before making any adjustments to your collar. If you have long hair, keep it outside of the collar. If you are allowed to remove the  collar for cleaning and bathing: Follow instructions about how to remove it safely. Clean it by hand with mild soap and water and air-dry it completely. If your collar has removable pads, remove them every 1-2 days and wash them by hand with soap and water. Let them air-dry completely before putting them back in the collar. Tell your provider if your skin under  the collar has irritation or sores. Managing pain, stiffness, and swelling     Use a cervical traction device as told. If told, put ice on the affected area. Put ice in a plastic bag. Place a towel between your skin and the bag. Leave the ice on for 20 minutes, 2-3 times a day. If told, apply heat to the affected area before you exercise or as often as told by your provider. Use the heat source that your provider recommends, such as a moist heat pack or a heating pad. Place a towel between your skin and the heat source. Leave the heat on for 20-30 minutes. If your skin turns bright red, remove the ice or heat right away to prevent skin damage. The risk of damage is higher if you cannot feel pain, heat, or cold. Activity Do not drive while wearing a cervical collar. If you do not have a cervical collar, ask if it is safe to drive while your neck heals. Do not lift anything that is heavier than 10 lb (4.5 kg) until your provider says that it is safe. Rest as told by your provider. Avoid positions and activities that make your symptoms worse. Do physical therapy exercises as told by your provider or physical therapist. Return to your normal activities as told by your provider. Ask your provider what activities are safe for you. General instructions Do not use any products that contain nicotine or tobacco. These products include cigarettes, chewing tobacco, and vaping devices, such as e-cigarettes. These can delay healing. If you need help quitting, ask your provider. Keep all follow-up visits. Your provider will monitor your injury and activity level. How is this prevented? To prevent a cervical sprain from happening again: Use and maintain good posture. Make any needed adjustments to your workstation to help you do this. Exercise regularly as told by your provider or physical therapist. Avoid risky activities that may cause a cervical sprain. Contact a health care provider if: You have  symptoms that get worse or do not get better after 2 weeks of treatment. You have new symptoms. Your pain gets worse or does not get better with medicine. You have sores or irritated skin on your neck from wearing your cervical collar. Get help right away if: You have severe pain. You develop numbness, tingling, or weakness in any part of your body. You cannot move a part of your body (you have paralysis). You have neck pain along with severe dizziness or headache. This information is not intended to replace advice given to you by your health care provider. Make sure you discuss any questions you have with your health care provider. Document Revised: 10/27/2021 Document Reviewed: 10/27/2021 Elsevier Patient Education  2024 [employer].

## 2022-09-13 NOTE — Progress Notes (Signed)
Subjective:    Patient ID: Rhonda Garcia, female    DOB: [birth year], 82 y.o.   MRN: [id number]  Chief Complaint  Patient presents with   Neck Pain    X 3 weeks. When turns head it hurts it goes up into head. Fall about 1 mth ago   Medical Management of Chronic Issues   PT presents to the office today for CPE and  chronic follow up. She is followed by Cardiologists annually  for A Fib, hx NSTEMI, and CAD. Takes Eliquis.   She is followed by GI as needed for constipation, GERD.   She had elevated rheumatoid arthritis and saw a rheumatologists. She states her pain is a 8 out 10.  Neck Pain  This is a new problem. The current episode started 1 to 4 weeks ago. The problem occurs constantly. The quality of the pain is described as aching. The pain is at a severity of 9/10. The pain is moderate. She has tried NSAIDs for the symptoms. The treatment provided mild relief.  Hypertension This is a chronic problem. The current episode started more than 1 year ago. The problem has been resolved since onset. The problem is controlled. Associated symptoms include neck pain. Pertinent negatives include no blurred vision, malaise/fatigue, peripheral edema or shortness of breath. Risk factors for coronary artery disease include diabetes mellitus, dyslipidemia and obesity. The current treatment provides moderate improvement. Identifiable causes of hypertension include a thyroid problem.  Gastroesophageal Reflux She complains of belching and heartburn. She reports no hoarse voice. This is a chronic problem. The current episode started more than 1 year ago. The problem occurs occasionally. Associated symptoms include fatigue. She has tried a PPI for the symptoms. The treatment provided moderate relief.  Thyroid Problem Presents for follow-up visit. Symptoms include constipation, dry skin and fatigue. Patient reports no anxiety or hoarse voice. The symptoms have been stable. Her past medical history is  significant for hyperlipidemia.  Hyperlipidemia This is a chronic problem. The current episode started more than 1 year ago. The problem is controlled. Recent lipid tests were reviewed and are normal. Exacerbating diseases include obesity. Pertinent negatives include no shortness of breath. Current antihyperlipidemic treatment includes statins. The current treatment provides moderate improvement of lipids. Risk factors for coronary artery disease include diabetes mellitus, dyslipidemia, hypertension, a sedentary lifestyle and post-menopausal.  Diabetes She presents for her follow-up diabetic visit. She has type 2 diabetes mellitus. Pertinent negatives for hypoglycemia include no nervousness/anxiousness. Associated symptoms include fatigue and foot paresthesias. Pertinent negatives for diabetes include no blurred vision. Symptoms are stable. Diabetic complications include peripheral neuropathy. Risk factors for coronary artery disease include dyslipidemia, diabetes mellitus, hypertension, post-menopausal and sedentary lifestyle. She is following a generally healthy diet. Her overall blood glucose range is 180-200 mg/dl. Eye exam is current.  Arthritis Presents for follow-up visit. She complains of pain and stiffness. Affected locations include the neck, left knee, right knee, left MCP and right MCP. Her pain is at a severity of 8/10. Associated symptoms include fatigue.  Back Pain This is a chronic problem. The current episode started more than 1 year ago. The problem occurs intermittently. The problem has been waxing and waning since onset. The pain is present in the thoracic spine. The quality of the pain is described as aching. The pain is at a severity of 9/10. The pain is moderate.  Depression        This is a chronic problem.  The current episode started more than 1  year ago.   The problem occurs intermittently.  Associated symptoms include fatigue.  Associated symptoms include no helplessness, no  hopelessness and not sad.  Past treatments include SSRIs - Selective serotonin reuptake inhibitors.  Past medical history includes thyroid problem.   Constipation This is a chronic problem. The current episode started more than 1 year ago. The problem has been resolved since onset. Associated symptoms include back pain. She has tried laxatives for the symptoms. The treatment provided moderate relief.    Current opioids rx- Ultram 50 mg #20 and Xanax 0.5 mg #20 Effectiveness of current meds-stable, only takes as needed Adverse reactions from pain meds-Constipation Morphine equivalent- 15   Pill count performed-No Last drug screen - 06/13/21 ( high risk q88m, moderate risk q60m, low risk yearly ) Urine drug screen today- No Was the [facility] reviewed- yes             If yes were their any concerning findings? - no, has not had xanax fill since 11/07/21, and Ultram 12/18/21   Pain contract signed on:06/16/21  Review of Systems  Constitutional:  Positive for fatigue. Negative for malaise/fatigue.  HENT:  Negative for hoarse voice.   Eyes:  Negative for blurred vision.  Respiratory:  Negative for shortness of breath.   Gastrointestinal:  Positive for constipation and heartburn.  Musculoskeletal:  Positive for arthritis, back pain, neck pain and stiffness.  Psychiatric/Behavioral:  Positive for depression. The patient is not nervous/anxious.   All other systems reviewed and are negative.  Family History  Problem Relation Age of Onset   Heart attack Mother    Hypertension Mother    Stroke Mother        Deceased, age 53   Diabetes Sister    Arthritis Sister    Diabetes Sister    Cancer Sister        ovarian   Stroke Sister    Alzheimer's disease Sister    Diabetes Brother        also has a blood disorder    Clotting disorder Brother    Kidney disease Brother    Heart disease Brother    Melanoma Brother        deceased   Diabetes Son    Hypertension Son    Coronary artery disease  Son 3       CABG   Stroke Son    Hypertension Son    Coronary artery disease Son 71       CABG   Colon cancer Neg Hx    Social History   Socioeconomic History   Marital status: Married    Spouse name: Not on file   Number of children: 4   Years of education: 6   Highest education level: 6th grade  Occupational History   Occupation: Retired    Comment: Engineer, site  Tobacco Use   Smoking status: Never   Smokeless tobacco: Never  Building services engineer Use: Never used  Substance and Sexual Activity   Alcohol use: Not Currently    Alcohol/week: 1.0 standard drink of alcohol    Types: 1 Glasses of wine per week   Drug use: No   Sexual activity: Not Currently    Birth control/protection: Post-menopausal  Other Topics Concern   Not on file  Social History Narrative    Has 4 adult children. She lives home with one of her sons who recently had a stroke and needs 24 hour care. She is the primary caregiver and  is now legally separated from her husband.    Social Determinants of Health   Financial Resource Strain: Low Risk  (07/24/2022)   Overall Financial Resource Strain (CARDIA)    Difficulty of Paying Living Expenses: Not hard at all  Food Insecurity: No Food Insecurity (07/24/2022)   Hunger Vital Sign    Worried About Running Out of Food in the Last Year: Never true    Ran Out of Food in the Last Year: Never true  Transportation Needs: No Transportation Needs (07/24/2022)   PRAPARE - Administrator, Civil Service (Medical): No    Lack of Transportation (Non-Medical): No  Physical Activity: Insufficiently Active (07/24/2022)   Exercise Vital Sign    Days of Exercise per Week: 1 day    Minutes of Exercise per Session: 30 min  Stress: No Stress Concern Present (07/24/2022)   [name] of Occupational Health - Occupational Stress Questionnaire    Feeling of Stress : Not at all  Social Connections: Moderately Integrated (07/24/2022)   Social Connection and  Isolation Panel [NHANES]    Frequency of Communication with Friends and Family: More than three times a week    Frequency of Social Gatherings with Friends and Family: More than three times a week    Attends Religious Services: More than 4 times per year    Active Member of [employer] or Organizations: No    Attends Banker Meetings: Never    Marital Status: Married       Objective:   Physical Exam Vitals reviewed.  Constitutional:      General: She is not in acute distress.    Appearance: She is well-developed. She is obese.  HENT:     Head: Normocephalic and atraumatic.     Right Ear: Tympanic membrane normal.     Left Ear: Tympanic membrane normal.  Eyes:     Pupils: Pupils are equal, round, and reactive to light.  Neck:     Thyroid: No thyromegaly.  Cardiovascular:     Rate and Rhythm: Normal rate and regular rhythm.     Heart sounds: Normal heart sounds. No murmur heard. Pulmonary:     Effort: Pulmonary effort is normal. No respiratory distress.     Breath sounds: Normal breath sounds. No wheezing.  Abdominal:     General: Bowel sounds are normal. There is no distension.     Palpations: Abdomen is soft.     Tenderness: There is no abdominal tenderness.  Musculoskeletal:        General: No tenderness. Normal range of motion.     Cervical back: Normal range of motion and neck supple.  Skin:    General: Skin is warm and dry.  Neurological:     Mental Status: She is alert and oriented to person, place, and time.     Cranial Nerves: No cranial nerve deficit.     Deep Tendon Reflexes: Reflexes are normal and symmetric.  Psychiatric:        Behavior: Behavior normal.        Thought Content: Thought content normal.        Judgment: Judgment normal.       BP 132/77   Pulse 72   Temp 97.7 F (36.5 C) (Temporal)   Ht 5\' 2"  (1.575 m)   Wt 164 lb 6.4 oz (74.6 kg)   SpO2 95%   BMI 30.07 kg/m      Assessment & Plan:   ARLESHA SPELMAN comes in  today  with chief complaint of Neck Pain (X 3 weeks. When turns head it hurts it goes up into head. Fall about 1 mth ago) and Medical Management of Chronic Issues   Diagnosis and orders addressed:  1. Osteoarthritis, unspecified osteoarthritis type, unspecified site - traMADol (ULTRAM) 50 MG tablet; Take 1 tablet (50 mg total) by mouth every 8 (eight) hours as needed.  Dispense: 20 tablet; Refill: 2 - CMP14+EGFR - CBC with Differential/Platelet - Baclofen 5 MG TABS; Take 1 tablet (5 mg total) by mouth 3 (three) times daily as needed.  Dispense: 90 tablet; Refill: 0  2. Back pain with sciatica - traMADol (ULTRAM) 50 MG tablet; Take 1 tablet (50 mg total) by mouth every 8 (eight) hours as needed.  Dispense: 20 tablet; Refill: 2 - CMP14+EGFR - CBC with Differential/Platelet - Baclofen 5 MG TABS; Take 1 tablet (5 mg total) by mouth 3 (three) times daily as needed.  Dispense: 90 tablet; Refill: 0  3. Bilateral primary osteoarthritis of knee - traMADol (ULTRAM) 50 MG tablet; Take 1 tablet (50 mg total) by mouth every 8 (eight) hours as needed.  Dispense: 20 tablet; Refill: 2 - CMP14+EGFR - CBC with Differential/Platelet  4. Constipation, unspecified constipation type - CMP14+EGFR - CBC with Differential/Platelet  5. Coronary artery disease involving native coronary artery of native heart with unstable angina pectoris (HCC) - CMP14+EGFR - CBC with Differential/Platelet  6. Moderate episode of recurrent major depressive disorder (HCC) - CMP14+EGFR - CBC with Differential/Platelet  7. Type 2 diabetes mellitus with other specified complication, without long-term current use of insulin (HCC) - dapagliflozin propanediol (FARXIGA) 10 MG TABS tablet; Take 1 tablet (10 mg total) by mouth daily before breakfast.  Dispense: 90 tablet; Refill: 2 - CMP14+EGFR - CBC with Differential/Platelet - Bayer DCA Hb A1c Waived  8. GAD (generalized anxiety disorder) - ToxASSURE Select 13 (MW), Urine -  ALPRAZolam (XANAX) 0.5 MG tablet; Take 1 tablet (0.5 mg total) by mouth daily as needed.  Dispense: 20 tablet; Refill: 2 - CMP14+EGFR - CBC with Differential/Platelet  9. Gastroesophageal reflux disease without esophagitis - CMP14+EGFR - CBC with Differential/Platelet  10. Generalized osteoarthritis of multiple sites - CMP14+EGFR - CBC with Differential/Platelet  11. Hx of non-ST elevation myocardial infarction (NSTEMI) - CMP14+EGFR - CBC with Differential/Platelet  12. Hyperlipidemia associated with type 2 diabetes mellitus (HCC) - CMP14+EGFR - CBC with Differential/Platelet - Lipid panel  13. Hypertension associated with diabetes (HCC) - CMP14+EGFR - CBC with Differential/Platelet  14. Hypothyroidism, unspecified type - CMP14+EGFR - CBC with Differential/Platelet - TSH  15. Annual physical exam - CMP14+EGFR - CBC with Differential/Platelet - Bayer DCA Hb A1c Waived - Lipid panel - TSH  16. Neck pain  - Baclofen 5 MG TABS; Take 1 tablet (5 mg total) by mouth 3 (three) times daily as needed.  Dispense: 90 tablet; Refill: 0   Labs pending Will increase Farxiga to 10 mg from 5 mg  Low carb diet  Tylenol as needed  Can not take NSAID's because Eliquis  Low dose baclofen as needed for neck pain, sedation precautions  Patient reviewed in  controlled database, no flags noted. Contract and drug screen are up to date. Health Maintenance reviewed Diet and exercise encouraged  Follow up plan: 3 months    Jannifer Rodney, FNP

## 2022-09-14 ENCOUNTER — Ambulatory Visit: Payer: Medicare Other | Admitting: Family

## 2022-09-14 ENCOUNTER — Other Ambulatory Visit: Payer: Self-pay | Admitting: Family

## 2022-09-14 LAB — CMP14+EGFR
ALT: 15 IU/L (ref 0–32)
Albumin/Globulin Ratio: 1.7 (ref 1.2–2.2)
Alkaline Phosphatase: 115 IU/L (ref 44–121)
Calcium: 10.1 mg/dL (ref 8.7–10.3)
Chloride: 103 mmol/L (ref 96–106)
Globulin, Total: 2.4 g/dL (ref 1.5–4.5)
Glucose: 223 mg/dL — ABNORMAL HIGH (ref 70–99)
Potassium: 4.3 mmol/L (ref 3.5–5.2)
Sodium: 142 mmol/L (ref 134–144)
Total Protein: 6.4 g/dL (ref 6.0–8.5)
eGFR: 49 mL/min/{1.73_m2} — ABNORMAL LOW (ref >59)

## 2022-09-14 LAB — LIPID PANEL
Chol/HDL Ratio: 2.6 ratio (ref 0.0–4.4)
HDL: 47 mg/dL (ref >39)
LDL Chol Calc (NIH): 56 mg/dL (ref 0–99)
VLDL Cholesterol Cal: 18 mg/dL (ref 5–40)

## 2022-09-14 LAB — TSH: TSH: 2.22 u[IU]/mL (ref 0.450–4.500)

## 2022-09-14 LAB — CBC WITH DIFFERENTIAL/PLATELET
Basophils Absolute: 0.1 10*3/uL (ref 0.0–0.2)
Basos: 1 %
Eos: 2 %
Hematocrit: 37.9 % (ref 34.0–46.6)
Hemoglobin: 12 g/dL (ref 11.1–15.9)
Immature Grans (Abs): 0 10*3/uL (ref 0.0–0.1)
Lymphocytes Absolute: 1 10*3/uL (ref 0.7–3.1)
Lymphs: 22 %
MCH: 29.2 pg (ref 26.6–33.0)
MCHC: 31.7 g/dL (ref 31.5–35.7)
MCV: 92 fL (ref 79–97)
Monocytes Absolute: 0.4 10*3/uL (ref 0.1–0.9)
Monocytes: 9 %
RBC: 4.11 x10E6/uL (ref 3.77–5.28)
RDW: 13 % (ref 11.7–15.4)
WBC: 4.5 10*3/uL (ref 3.4–10.8)

## 2022-09-14 NOTE — Addendum Note (Signed)
Addended by: Ignacia Bayley on: 09/14/2022 10:32 AM   Modules accepted: Orders

## 2022-09-19 LAB — TOXASSURE SELECT 13 (MW), URINE

## 2022-09-21 ENCOUNTER — Ambulatory Visit (INDEPENDENT_AMBULATORY_CARE_PROVIDER_SITE_OTHER): Payer: Medicare Other | Admitting: Pharmacist

## 2022-09-21 DIAGNOSIS — E1169 Type 2 diabetes mellitus with other specified complication: Secondary | ICD-10-CM

## 2022-09-21 NOTE — Progress Notes (Signed)
09/21/2022 Name: Rhonda Garcia MRN: [id number] DOB: May 01, 1940  Chief Complaint  Patient presents with   Diabetes    Rhonda Garcia is Garcia 82 y.o. year old female who presented for Garcia telephone visit.   They were referred to the pharmacist by their PCP for assistance in managing diabetes.   Subjective:  Care Team: Primary Care Provider: Junie Spencer, FNP   Medication Access/Adherence  Current Pharmacy:  [facility] - [place], [place] - [address] [place] [address] Phone: [phone] Fax: [phone]  [facility] [place], [place] - [address] [place] [address] Phone: [phone] Fax: [phone]   Patient reports affordability concerns with their medications: No  Patient reports access/transportation concerns to their pharmacy: No  Patient reports adherence concerns with their medications:  No     Diabetes:  Current medications:  Medications tried in the past: metformin (d/c'd leg pain); ozempic (only tried 1 month in 2021--liked but costly); jardiance (vaginal  Current glucose readings: FBG 190-250 (pt reports to be coming down over time) testing one time daily (fasting)  Patient denies hypoglycemic s/sx including dizziness, shakiness, sweating. Patient denies hyperglycemic symptoms including polyuria, polydipsia, polyphagia, nocturia, neuropathy, blurred vision.  Current meal patterns:  Enjoys sweets  Current physical activity: n/Garcia  Current medication access support: az&me PAP for Farxiga   Objective:  Lab Results  Component Value Date   HGBA1C 9.1 (H) 09/13/2022    Lab Results  Component Value Date   CREATININE 1.13 (H) 09/13/2022   BUN 17 09/13/2022   NA 142 09/13/2022   K 4.3 09/13/2022   CL 103 09/13/2022   CO2 25 09/13/2022    Lab Results  Component Value Date   CHOL 121 09/13/2022   HDL 47 09/13/2022   LDLCALC 56 09/13/2022   TRIG 92 09/13/2022   CHOLHDL 2.6  09/13/2022    Medications Reviewed Today     Reviewed by Junie Spencer, FNP (Family Nurse Practitioner) on 09/13/22 at 1022  Med List Status: <None>   Medication Order Taking? Sig Documenting Provider Last Dose Status Informant  ALPRAZolam (XANAX) 0.5 MG tablet [id number]  Take 1 tablet (0.5 mg total) by mouth daily as needed. Jannifer Rodney A, FNP  Active   Baclofen 5 MG TABS [id number] Yes Take 1 tablet (5 mg total) by mouth 3 (three) times daily as needed. Junie Spencer, FNP  Active   blood glucose meter kit and supplies [id number] Yes Dispense based on patient and insurance preference. Use up to four times daily as directed. (FOR ICD-10 E10.9, E11.9). Junie Spencer, FNP Taking Active   clobetasol cream (TEMOVATE) 0.05 % [id number] Yes Apply 1 Application topically 2 (two) times daily. Junie Spencer, FNP Taking Active   dapagliflozin propanediol (FARXIGA) 10 MG TABS tablet [id number] Yes Take 1 tablet (10 mg total) by mouth daily before breakfast. Junie Spencer, FNP  Active   ELIQUIS 5 MG TABS tablet [id number] Yes TAKE ONE TABLET BY MOUTH TWICE DAILY Rollene Rotunda, MD Taking Active   escitalopram (LEXAPRO) 10 MG tablet [id number] Yes Take 1 tablet (10 mg total) by mouth daily. Junie Spencer, FNP Taking Active Self  glucose blood Rhonda Garcia VERIO) test strip [id number] Yes Test BS QID Dx E11.9 Jannifer Rodney A, FNP Taking Active   hydroxychloroquine (PLAQUENIL) 200 MG tablet [id number] Yes TAKE ONE (1) TABLET BY MOUTH EVERY DAY Rhonda Garcia Orleans, MD Taking Active  isosorbide mononitrate (IMDUR) 60 MG 24 hr tablet [id number] Yes TAKE ONE (1) TABLET EACH DAY Hawks, Edilia Bo, FNP Taking Active   Lancets [facility] [place] PLUS [place]) MISC [id number] Yes Test BS QID Dx E11.9 Junie Spencer, FNP Taking Active   levothyroxine (SYNTHROID) 50 MCG tablet [id number] Yes TAKE ONE (1) TABLET EACH DAY Hawks, [place] A, FNP Taking Active   linaclotide (LINZESS) 72 MCG capsule [id number] Yes  Take 1 capsule (72 mcg total) by mouth daily before breakfast. Junie Spencer, FNP Taking Active   lisinopril (ZESTRIL) 20 MG tablet [id number] Yes Take 1 tablet (20 mg total) by mouth daily. Jannifer Rodney A, FNP Taking Active   metoprolol tartrate (LOPRESSOR) 50 MG tablet [id number] Yes TAKE ONE TABLET BY MOUTH TWICE DAILY Hawks, Christy A, FNP Taking Active   nitroGLYCERIN (NITROSTAT) 0.4 MG SL tablet [id number] Yes DISSOLVE 1 TAB UNDER TOUNGE FOR CHEST PAIN. MAY REPEAT EVERY 5 MINUTES FOR 3 DOSES. IF NO RELIEF CALL 911 OR GO TO ER Junie Spencer, FNP Taking Active   nystatin cream (MYCOSTATIN) [id number] Yes Apply 1 Application topically 2 (two) times daily. Jannifer Rodney A, FNP Taking Active   ondansetron (ZOFRAN) 4 MG tablet [id number] Yes TAKE ONE TABLET BY MOUTH EVERY EIGHT HOURS AS NEEDED Jannifer Rodney A, FNP Taking Active   pantoprazole (PROTONIX) 40 MG tablet [id number] Yes TAKE ONE TABLET BY MOUTH TWICE DAILY Junie Spencer, FNP Taking Active   pravastatin (PRAVACHOL) 80 MG tablet [id number] Yes TAKE 1 TABLET DAILY IN THE EVENING Hawks, Christy A, FNP Taking Active   traMADol (ULTRAM) 50 MG tablet [id number]  Take 1 tablet (50 mg total) by mouth every 8 (eight) hours as needed. Junie Spencer, FNP  Active               Assessment/Plan:   Diabetes: - Currently uncontrolled - Reviewed long term cardiovascular and renal outcomes of uncontrolled blood sugar - Reviewed goal A1c, goal fasting, and goal 2 hour post prandial glucose - Recommend to: Continue farxiga 10mg  daily Increase water intake May have to add GLP1 (Denies personal and family history of Medullary thyroid cancer (MTC) Would need assistance Follow up in 4 weeks to ensure blood sugar is trending down Encouraged patient to call PCP office sooner if Bgs are staying in the 200s - Recommend to check glucose daily or if symptomatic - Meets financial criteria for farxiga patient assistance program through az&me PAP.  Will collaborate with provider, CPhT, and patient to pursue assistance.     Follow Up Plan: 4 weeks w/ pharmd PCP in 11/2022  Rhonda Brightly, PharmD, BCACP Clinical Pharmacist, [facility]

## 2022-10-01 ENCOUNTER — Other Ambulatory Visit: Payer: Self-pay | Admitting: Family

## 2022-10-01 DIAGNOSIS — K219 Gastro-esophageal reflux disease without esophagitis: Secondary | ICD-10-CM

## 2022-10-01 DIAGNOSIS — E1169 Type 2 diabetes mellitus with other specified complication: Secondary | ICD-10-CM

## 2022-10-03 ENCOUNTER — Other Ambulatory Visit: Payer: Self-pay | Admitting: Family

## 2022-10-03 ENCOUNTER — Inpatient Hospital Stay: Admission: RE | Admit: 2022-10-03 | Payer: Medicare Other | Source: Ambulatory Visit

## 2022-10-03 DIAGNOSIS — Z1231 Encounter for screening mammogram for malignant neoplasm of breast: Secondary | ICD-10-CM

## 2022-10-10 ENCOUNTER — Encounter (HOSPITAL_COMMUNITY): Payer: Self-pay

## 2022-10-10 ENCOUNTER — Other Ambulatory Visit: Payer: Self-pay

## 2022-10-10 ENCOUNTER — Encounter (HOSPITAL_COMMUNITY)
Admission: RE | Admit: 2022-10-10 | Discharge: 2022-10-10 | Disposition: A | Discharge status: Home / Self Care | Payer: Medicare Other | Source: Ambulatory Visit | Attending: Internal Medicine | Admitting: Internal Medicine

## 2022-10-12 ENCOUNTER — Encounter (HOSPITAL_COMMUNITY)
Admission: RE | Disposition: A | Discharge status: Home / Self Care | Payer: Self-pay | Source: Home / Self Care | Attending: Internal Medicine

## 2022-10-12 ENCOUNTER — Ambulatory Visit (HOSPITAL_COMMUNITY): Payer: Medicare Other | Admitting: Certified Registered Nurse Anesthetist

## 2022-10-12 ENCOUNTER — Ambulatory Visit (HOSPITAL_COMMUNITY)
Admission: RE | Admit: 2022-10-12 | Discharge: 2022-10-12 | Disposition: A | Discharge status: Home / Self Care | Payer: Medicare Other | Attending: Internal Medicine | Admitting: Internal Medicine

## 2022-10-12 ENCOUNTER — Encounter (HOSPITAL_COMMUNITY): Payer: Self-pay | Admitting: Internal Medicine

## 2022-10-12 DIAGNOSIS — I2510 Atherosclerotic heart disease of native coronary artery without angina pectoris: Secondary | ICD-10-CM | POA: Insufficient documentation

## 2022-10-12 DIAGNOSIS — K449 Diaphragmatic hernia without obstruction or gangrene: Secondary | ICD-10-CM

## 2022-10-12 DIAGNOSIS — I25119 Atherosclerotic heart disease of native coronary artery with unspecified angina pectoris: Secondary | ICD-10-CM | POA: Diagnosis not present

## 2022-10-12 DIAGNOSIS — I252 Old myocardial infarction: Secondary | ICD-10-CM | POA: Diagnosis not present

## 2022-10-12 DIAGNOSIS — R194 Change in bowel habit: Secondary | ICD-10-CM | POA: Diagnosis not present

## 2022-10-12 DIAGNOSIS — E119 Type 2 diabetes mellitus without complications: Secondary | ICD-10-CM | POA: Diagnosis not present

## 2022-10-12 DIAGNOSIS — Z7984 Long term (current) use of oral hypoglycemic drugs: Secondary | ICD-10-CM | POA: Diagnosis not present

## 2022-10-12 DIAGNOSIS — I10 Essential (primary) hypertension: Secondary | ICD-10-CM | POA: Diagnosis not present

## 2022-10-12 DIAGNOSIS — R1314 Dysphagia, pharyngoesophageal phase: Secondary | ICD-10-CM | POA: Diagnosis not present

## 2022-10-12 DIAGNOSIS — K219 Gastro-esophageal reflux disease without esophagitis: Secondary | ICD-10-CM | POA: Diagnosis not present

## 2022-10-12 DIAGNOSIS — E039 Hypothyroidism, unspecified: Secondary | ICD-10-CM | POA: Diagnosis not present

## 2022-10-12 DIAGNOSIS — F32A Depression, unspecified: Secondary | ICD-10-CM | POA: Diagnosis not present

## 2022-10-12 DIAGNOSIS — G4730 Sleep apnea, unspecified: Secondary | ICD-10-CM | POA: Diagnosis not present

## 2022-10-12 DIAGNOSIS — R1310 Dysphagia, unspecified: Secondary | ICD-10-CM | POA: Diagnosis not present

## 2022-10-12 DIAGNOSIS — I480 Paroxysmal atrial fibrillation: Secondary | ICD-10-CM | POA: Diagnosis not present

## 2022-10-12 HISTORY — PX: FLEXIBLE SIGMOIDOSCOPY: SHX5431

## 2022-10-12 HISTORY — PX: ESOPHAGOGASTRODUODENOSCOPY (EGD) WITH PROPOFOL: SHX5813

## 2022-10-12 HISTORY — PX: MALONEY DILATION: SHX5535

## 2022-10-12 LAB — GLUCOSE, CAPILLARY: Glucose-Capillary: 172 mg/dL — ABNORMAL HIGH (ref 70–99)

## 2022-10-12 SURGERY — ESOPHAGOGASTRODUODENOSCOPY (EGD) WITH PROPOFOL
Anesthesia: General

## 2022-10-12 MED ORDER — LIDOCAINE HCL (PF) 2 % IJ SOLN
INTRAMUSCULAR | Status: AC
  Filled 2022-10-12: qty 5

## 2022-10-12 MED ORDER — PROPOFOL 500 MG/50ML IV EMUL
INTRAVENOUS | Status: DC | PRN
  Administered 2022-10-12: 150 ug/kg/min via INTRAVENOUS

## 2022-10-12 MED ORDER — PROPOFOL 500 MG/50ML IV EMUL
INTRAVENOUS | Status: AC
  Filled 2022-10-12: qty 50

## 2022-10-12 MED ORDER — LIDOCAINE HCL (PF) 2 % IJ SOLN
INTRAMUSCULAR | Status: DC | PRN
  Administered 2022-10-12: 50 mg via INTRADERMAL

## 2022-10-12 MED ORDER — LACTATED RINGERS IV SOLN: INTRAVENOUS | Status: DC | PRN

## 2022-10-12 MED ORDER — PROPOFOL 10 MG/ML IV BOLUS
INTRAVENOUS | Status: DC | PRN
  Administered 2022-10-12: 60 mg via INTRAVENOUS

## 2022-10-12 NOTE — Anesthesia Postprocedure Evaluation (Signed)
Anesthesia Post Note  Patient: SARAIYAH SALVATORI  Procedure(s) Performed: ESOPHAGOGASTRODUODENOSCOPY (EGD) WITH PROPOFOL MALONEY DILATION FLEXIBLE SIGMOIDOSCOPY  Patient location during evaluation: Phase II Anesthesia Type: General Level of consciousness: awake Pain management: pain level controlled Vital Signs Assessment: post-procedure vital signs reviewed and stable Respiratory status: spontaneous breathing and respiratory function stable Cardiovascular status: blood pressure returned to baseline and stable Postop Assessment: no headache and no apparent nausea or vomiting Anesthetic complications: no Comments: Late entry   No notable events documented.   Last Vitals:  Vitals:   10/12/22 0809 10/12/22 1055  BP: 134/61 (!) 127/56  Pulse: 74 75  Resp: 16 19  Temp: 36.7 C 36.6 C  SpO2: 93% 97%    Last Pain:  Vitals:   10/12/22 1055  TempSrc:   PainSc: 0-No pain                 [place]

## 2022-10-12 NOTE — H&P (Signed)
@LOGO @   Primary Care Physician:  Junie Spencer, FNP Primary Gastroenterologist:  Dr. Jena Gauss   Pre-Procedure History & Physical: HPI:  Rhonda Garcia is a 82 y.o. female here for   Further management of recurrent esophageal dysphagia and change in bowel habits she is here for an EGD/ ED and colonoscopy today.  Past Medical History:  Diagnosis Date   A-fib (HCC)    Anal fissure    resolved    Anemia    Back pain    with left radiculopathy   CAD (coronary artery disease)    Cataract    Collagen vascular disease (HCC)    DDD (degenerative disc disease)    Depression    Diabetes mellitus    Diverticulosis    Dyslipidemia    Dysrhythmia    AFib   Esophagitis    GERD (gastroesophageal reflux disease)    Hiatal hernia    Hx of peptic ulcer    Hyperlipidemia    Hypertension    Hypothyroidism    Internal hemorrhoids 2017   NSTEMI (non-ST elevated myocardial infarction) (HCC) 08/05/2015   Osteopenia    Sleep apnea    not using CPAP now, could not tolerate and PCP aware.   Thyroid disease     Past Surgical History:  Procedure Laterality Date   ABDOMINAL HYSTERECTOMY Bilateral 2001 - approximate   APPENDECTOMY     BREAST REDUCTION SURGERY     CARDIAC CATHETERIZATION N/A 08/08/2015   Procedure: Left Heart Cath and Coronary Angiography;  Surgeon: Kathleene Hazel, MD;  Location: [facility];  Service: Cardiovascular;  Laterality: N/A;   CATARACT EXTRACTION Bilateral    CHOLECYSTECTOMY  2008 - approximate   COLONOSCOPY  06/2007   Friable anal canal and anal papillae. Pancolonic diverticula. Normal terminal ileum. Status post segmental biopsy, descending colon biopsy showed minimal cryptitis. Biopsies felt to be  nonspecific but could be seen with NSAIDs. No features of inflammatory bowel disease. Stool studies were negative.   COLONOSCOPY  03/21/2011   RMR: colonic polyps treated as described above, colonic diverticulosis (Tubular adenomas)   COLONOSCOPY  WITH PROPOFOL N/A 06/17/2014   RMR: Colonic diverticulosis. Multiple colonic polyps removed as described above.    COLONOSCOPY WITH PROPOFOL N/A 03/19/2016   Procedure: COLONOSCOPY WITH PROPOFOL;  Surgeon: Corbin Ade, MD;  [facility];  Service: Endoscopy;  Laterality: N/A;  8:45am   COLONOSCOPY WITH PROPOFOL N/A 10/02/2018   Procedure: COLONOSCOPY WITH PROPOFOL;  Surgeon: Corbin Ade, MD;  [facility];  Service: Endoscopy;  Laterality: N/A;  9:15am   CORONARY ANGIOPLASTY WITH STENT PLACEMENT     4 stents   ESOPHAGEAL DILATION N/A 06/17/2014   Procedure: ESOPHAGEAL DILATION;  Surgeon: Corbin Ade, MD;  Location: [facility];  Service: Endoscopy;  Laterality: N/AElease Hashimoto 54 Fr   ESOPHAGOGASTRODUODENOSCOPY  06/2007   Small hiatal hernia   ESOPHAGOGASTRODUODENOSCOPY  03/21/2011   RMR: normal esophagus- status post passage of Maloney dilator. Small hiatal hernia. Trivial antral and bulbar erosions   ESOPHAGOGASTRODUODENOSCOPY (EGD) WITH PROPOFOL N/A 06/17/2014   RMR: Small hiatal hernia; otherwise normal EGD. Status post passage of a Maloney dilator. No explanation for patients right upper quadrant abdominal pain.    ESOPHAGOGASTRODUODENOSCOPY (EGD) WITH PROPOFOL N/A 03/19/2016   Procedure: ESOPHAGOGASTRODUODENOSCOPY (EGD) WITH PROPOFOL;  Surgeon: Corbin Ade, MD;  [facility];  Service: Endoscopy;  Laterality: N/A;   KNEE SURGERY Left    arthroscopy  LIPOMA EXCISION  03/17/2012   Procedure: EXCISION LIPOMA;  Surgeon: Cherylynn Ridges, MD;  Location: [facility];  Service: General;  Laterality: Right;  excision of right lower back lipoma   MALONEY DILATION N/A 03/19/2016   Procedure: Elease Hashimoto DILATION;  Surgeon: Corbin Ade, MD;  [facility];  Service: Endoscopy;  Laterality: N/A;   PATELLA FRACTURE SURGERY Right    POLYPECTOMY  06/17/2014   Procedure: POLYPECTOMY;  Surgeon: Corbin Ade, MD;  Location: [facility];  Service:  Endoscopy;;   POLYPECTOMY  10/02/2018   Procedure: POLYPECTOMY;  Surgeon: Corbin Ade, MD;  [facility];  Service: Endoscopy;;  colon   REDUCTION MAMMAPLASTY Bilateral     Prior to Admission medications   Medication Sig Start Date End Date Taking? Authorizing Provider  ALPRAZolam Prudy Feeler) 0.5 MG tablet Take 1 tablet (0.5 mg total) by mouth daily as needed. 09/13/22  Yes Hawks, Edilia Bo, FNP  blood glucose meter kit and supplies Dispense based on patient and insurance preference. Use up to four times daily as directed. (FOR ICD-10 E10.9, E11.9). 09/14/21  Yes Hawks, Christy A, FNP  Cholecalciferol (VITAMIN D) 50 MCG (2000 UT) tablet Take 2,000 Units by mouth once a week.   Yes [provider]  dapagliflozin propanediol (FARXIGA) 10 MG TABS tablet Take 1 tablet (10 mg total) by mouth daily before breakfast. 09/13/22  Yes Hawks, Edilia Bo, FNP  ELIQUIS 5 MG TABS tablet TAKE ONE TABLET BY MOUTH TWICE DAILY 06/25/22  Yes Rollene Rotunda, MD  glucose blood (ONETOUCH VERIO) test strip Test BS QID Dx E11.9 09/15/21  Yes Hawks, Christy A, FNP  isosorbide mononitrate (IMDUR) 60 MG 24 hr tablet TAKE ONE (1) TABLET EACH DAY 06/25/22  Yes Hawks, Edilia Bo, FNP  Lancets (ONETOUCH [place] PLUS [place]) MISC Test BS QID Dx E11.9 09/15/21  Yes Jannifer Rodney A, FNP  levothyroxine (SYNTHROID) 50 MCG tablet TAKE ONE (1) TABLET EACH DAY 06/25/22  Yes Hawks, Christy A, FNP  linaclotide (LINZESS) 72 MCG capsule Take 1 capsule (72 mcg total) by mouth daily before breakfast. 09/14/21  Yes Hawks, Christy A, FNP  Menthol-Methyl Salicylate (ARTHRITIS HOT EX) Apply 1 Application topically daily as needed (pain).   Yes [provider]  metoprolol tartrate (LOPRESSOR) 50 MG tablet TAKE ONE TABLET BY MOUTH TWICE DAILY 10/01/22  Yes Hawks, Christy A, FNP  Multiple Vitamins-Minerals (IMMUNE SUPPORT PO) Take 1 tablet by mouth once a week.   Yes [provider]  nystatin cream (MYCOSTATIN) Apply 1  Application topically 2 (two) times daily. Patient taking differently: Apply 1 Application topically 2 (two) times daily as needed (yeast). 08/14/22  Yes Hawks, Neysa Bonito A, FNP  pantoprazole (PROTONIX) 40 MG tablet TAKE ONE TABLET BY MOUTH TWICE DAILY 10/01/22  Yes Jannifer Rodney A, FNP  pravastatin (PRAVACHOL) 80 MG tablet TAKE 1 TABLET DAILY IN THE EVENING 10/01/22  Yes Hawks, Christy A, FNP  acetaminophen (TYLENOL) 650 MG CR tablet Take 650 mg by mouth every 8 (eight) hours as needed for pain.    [provider]  Baclofen 5 MG TABS Take 1 tablet (5 mg total) by mouth 3 (three) times daily as needed. 09/13/22   Jannifer Rodney A, FNP  hydroxychloroquine (PLAQUENIL) 200 MG tablet TAKE ONE (1) TABLET BY MOUTH EVERY DAY Patient taking differently: Take 200 mg by mouth daily as needed (pain). 12/12/20   Rice, Jamesetta Orleans, MD  nitroGLYCERIN (NITROSTAT) 0.4 MG SL tablet DISSOLVE 1 TAB UNDER  TOUNGE FOR CHEST PAIN. MAY REPEAT EVERY 5 MINUTES FOR 3 DOSES. IF NO RELIEF CALL 911 OR GO TO ER 11/07/21   Jannifer Rodney A, FNP  ondansetron (ZOFRAN) 4 MG tablet TAKE ONE TABLET BY MOUTH EVERY EIGHT HOURS AS NEEDED 06/25/22   Jannifer Rodney A, FNP  traMADol (ULTRAM) 50 MG tablet Take 1 tablet (50 mg total) by mouth every 8 (eight) hours as needed. 09/13/22   Junie Spencer, FNP    Allergies as of 09/10/2022 - Review Complete 09/05/2022  Allergen Reaction Noted   Fentanyl Anaphylaxis 12/10/2020   Promethazine hcl Anaphylaxis 09/06/2010    Family History  Problem Relation Age of Onset   Heart attack Mother    Hypertension Mother    Stroke Mother        Deceased, age 36   Diabetes Sister    Arthritis Sister    Diabetes Sister    Cancer Sister        ovarian   Stroke Sister    Alzheimer's disease Sister    Diabetes Brother        also has a blood disorder    Clotting disorder Brother    Kidney disease Brother    Heart disease Brother    Melanoma Brother        deceased   Diabetes Son     Hypertension Son    Coronary artery disease Son 25       CABG   Stroke Son    Hypertension Son    Coronary artery disease Son 12       CABG   Colon cancer Neg Hx     Social History   Socioeconomic History   Marital status: Married    Spouse name: Not on file   Number of children: 4   Years of education: 6   Highest education level: 6th grade  Occupational History   Occupation: Retired    Comment: Engineer, site  Tobacco Use   Smoking status: Never   Smokeless tobacco: Never  Building services engineer Use: Never used  Substance and Sexual Activity   Alcohol use: Not Currently    Alcohol/week: 1.0 standard drink of alcohol    Types: 1 Glasses of wine per week   Drug use: No   Sexual activity: Not Currently    Birth control/protection: Post-menopausal  Other Topics Concern   Not on file  Social History Narrative    Has 4 adult children. She lives home with one of her sons who recently had a stroke and needs 24 hour care. She is the primary caregiver and is now legally separated from her husband.    Social Determinants of Health   Financial Resource Strain: Low Risk  (07/24/2022)   Overall Financial Resource Strain (CARDIA)    Difficulty of Paying Living Expenses: Not hard at all  Food Insecurity: No Food Insecurity (07/24/2022)   Hunger Vital Sign    Worried About Running Out of Food in the Last Year: Never true    Ran Out of Food in the Last Year: Never true  Transportation Needs: No Transportation Needs (07/24/2022)   PRAPARE - Administrator, Civil Service (Medical): No    Lack of Transportation (Non-Medical): No  Physical Activity: Insufficiently Active (07/24/2022)   Exercise Vital Sign    Days of Exercise per Week: 1 day    Minutes of Exercise per Session: 30 min  Stress: No Stress Concern Present (07/24/2022)   [name]  of Occupational Health - Occupational Stress Questionnaire    Feeling of Stress : Not at all  Social Connections: Moderately  Integrated (07/24/2022)   Social Connection and Isolation Panel [NHANES]    Frequency of Communication with Friends and Family: More than three times a week    Frequency of Social Gatherings with Friends and Family: More than three times a week    Attends Religious Services: More than 4 times per year    Active Member of [employer] or Organizations: No    Attends Banker Meetings: Never    Marital Status: Married  Catering manager Violence: Not At Risk (07/24/2022)   Humiliation, Afraid, Rape, and Kick questionnaire    Fear of Current or Ex-Partner: No    Emotionally Abused: No    Physically Abused: No    Sexually Abused: No    Review of Systems: See HPI, otherwise negative ROS  Physical Exam: BP 134/61   Pulse 74   Temp 98.1 F (36.7 C) (Oral)   Resp 16   Ht 5\' 2"  (1.575 m)   Wt 73.5 kg   SpO2 93%   BMI 29.63 kg/m  General:   Alert,  Well-developed, well-nourished, pleasant and cooperative in NAD Neck:  Supple; no masses or thyromegaly. No significant cervical adenopathy. Lungs:  Clear throughout to auscultation.   No wheezes, crackles, or rhonchi. No acute distress. Heart:  Regular rate and rhythm; no murmurs, clicks, rubs,  or gallops. Abdomen: Non-distended, normal bowel sounds.  Soft and nontender without appreciable mass or hepatosplenomegaly.  Impression/Plan:    82 year old lady with recurrent esophageal dysphagia.  Change in bowel habits.  Chronic constipation  I will offer the patient an EGD/ ED with esophageal dilation as feasible/appropriate and a diagnostic colonoscopy. The risks, benefits, limitations, imponderables and alternatives regarding both EGD and colonoscopy have been reviewed with the patient. Questions have been answered. All parties agreeable.       Notice: This dictation was prepared with Dragon dictation along with smaller phrase technology. Any transcriptional errors that result from this process are unintentional and may not be corrected  upon review.

## 2022-10-12 NOTE — Op Note (Signed)
[facility] Patient Name: Rhonda Garcia Procedure Date: 10/12/2022 10:25 AM MRN: [id number] Date of Birth: 03-14-1941 Attending MD: Gennette Pac , MD, [id number] CSN: [id number] Age: 82 Admit Type: Outpatient Procedure:                Upper GI endoscopy Indications:              Dysphagia Providers:                Gennette Pac, MD, Sheran Fava, Dyann Ruddle Referring MD:              Medicines:                Propofol per Anesthesia Complications:            No immediate complications. Estimated Blood Loss:     Estimated blood loss was minimal. Procedure:                Pre-Anesthesia Assessment:                           - Prior to the procedure, a History and Physical                            was performed, and patient medications and                            allergies were reviewed. The patient's tolerance of                            previous anesthesia was also reviewed. The risks                            and benefits of the procedure and the sedation                            options and risks were discussed with the patient.                            All questions were answered, and informed consent                            was obtained. Prior Anticoagulants: The patient has                            taken no anticoagulant or antiplatelet agents. ASA                            Grade Assessment: III - A patient with severe                            systemic disease. After reviewing the risks and  benefits, the patient was deemed in satisfactory                            condition to undergo the procedure.                           After obtaining informed consent, the endoscope was                            passed under direct vision. Throughout the                            procedure, the patient's blood pressure, pulse, and                            oxygen saturations were  monitored continuously. The                            [id number]-[id number] ([id number]) scope was introduced through the                            mouth, and advanced to the second part of duodenum.                            The upper GI endoscopy was accomplished without                            difficulty. The patient tolerated the procedure                            well. Scope In: 10:40:25 AM Scope Out: 10:44:20 AM Total Procedure Duration: 0 hours 3 minutes 55 seconds  Findings:      The examined esophagus was normal. Gastric cavity empty.      A small hiatal hernia was present. Normal-appearing gastric mucosa.       Patent pylorus.      The duodenal bulb and second portion of the duodenum were normal. The       scope was withdrawn. Dilation was performed with a Maloney dilator . The       dilation site was examined following endoscope reinsertion and showed no       change. Estimated blood loss was minimal. There was a small mucosal tear       through the UES. Impression:               - Normal esophagus. Status post Maloney dilation.                           - Small hiatal hernia.                           - Normal duodenal bulb and second portion of the                            duodenum.                           -  No specimens collected. Moderate Sedation:      Moderate (conscious) sedation was personally administered by an       anesthesia professional. The following parameters were monitored: oxygen       saturation, heart rate, blood pressure, respiratory rate, EKG, adequacy       of pulmonary ventilation, and response to care. Recommendation:           - Patient has a contact number available for                            emergencies. The signs and symptoms of potential                            delayed complications were discussed with the                            patient. Return to normal activities tomorrow.                            Written discharge instructions were  provided to the                            patient.                           - Advance diet as tolerated.                           - Continue present medications.                           - Return to my office in 6 weeks. See colonoscopy                            report Procedure Code(s):        --- Professional ---                           [phone], Esophagogastroduodenoscopy, flexible,                            transoral; diagnostic, including collection of                            specimen(s) by brushing or washing, when performed                            (separate procedure)                           43450, Dilation of esophagus, by unguided sound or                            bougie, single or multiple passes Diagnosis Code(s):        --- Professional ---  K44.9, Diaphragmatic hernia without obstruction or                            gangrene                           R13.10, Dysphagia, unspecified CPT copyright 2022 American Medical Association. All rights reserved. The codes documented in this report are preliminary and upon coder review may  be revised to meet current compliance requirements. Gerrit Friends. Nolene Rocks, MD Gennette Pac, MD 10/12/2022 10:55:12 AM This report has been signed electronically. Number of Addenda: 0

## 2022-10-12 NOTE — Discharge Instructions (Signed)
EGD Discharge instructions Please read the instructions outlined below and refer to this sheet in the next few weeks. These discharge instructions provide you with general information on caring for yourself after you leave the hospital. Your doctor may also give you specific instructions. While your treatment has been planned according to the most current medical practices available, unavoidable complications occasionally occur. If you have any problems or questions after discharge, please call your doctor. ACTIVITY You may resume your regular activity but move at a slower pace for the next 24 hours.  Take frequent rest periods for the next 24 hours.  Walking will help expel (get rid of) the air and reduce the bloated feeling in your abdomen.  No driving for 24 hours (because of the anesthesia (medicine) used during the test).  You may shower.  Do not sign any important legal documents or operate any machinery for 24 hours (because of the anesthesia used during the test).  NUTRITION Drink plenty of fluids.  You may resume your normal diet.  Begin with a light meal and progress to your normal diet.  Avoid alcoholic beverages for 24 hours or as instructed by your caregiver.  MEDICATIONS You may resume your normal medications unless your caregiver tells you otherwise.  WHAT YOU CAN EXPECT TODAY You may experience abdominal discomfort such as a feeling of fullness or "gas" pains.  FOLLOW-UP Your doctor will discuss the results of your test with you.  SEEK IMMEDIATE MEDICAL ATTENTION IF ANY OF THE FOLLOWING OCCUR: Excessive nausea (feeling sick to your stomach) and/or vomiting.  Severe abdominal pain and distention (swelling).  Trouble swallowing.  Temperature over 101 F (37.8 C).  Rectal bleeding or vomiting of blood.    Sigmoidoscopy Discharge Instructions  Read the instructions outlined below and refer to this sheet in the next few weeks. These discharge instructions provide you with  general information on caring for yourself after you leave the hospital. Your doctor may also give you specific instructions. While your treatment has been planned according to the most current medical practices available, unavoidable complications occasionally occur. If you have any problems or questions after discharge, call Dr. Jena Gauss at [phone]. ACTIVITY You may resume your regular activity, but move at a slower pace for the next 24 hours.  Take frequent rest periods for the next 24 hours.  Walking will help get rid of the air and reduce the bloated feeling in your belly (abdomen).  No driving for 24 hours (because of the medicine (anesthesia) used during the test).   Do not sign any important legal documents or operate any machinery for 24 hours (because of the anesthesia used during the test).  NUTRITION Drink plenty of fluids.  You may resume your normal diet as instructed by your doctor.  Begin with a light meal and progress to your normal diet. Heavy or fried foods are harder to digest and may make you feel sick to your stomach (nauseated).  Avoid alcoholic beverages for 24 hours or as instructed.  MEDICATIONS You may resume your normal medications unless your doctor tells you otherwise.  WHAT YOU CAN EXPECT TODAY Some feelings of bloating in the abdomen.  Passage of more gas than usual.  Spotting of blood in your stool or on the toilet paper.  IF YOU HAD POLYPS REMOVED DURING THE COLONOSCOPY: No aspirin products for 7 days or as instructed.  No alcohol for 7 days or as instructed.  Eat a soft diet for the next 24 hours.  FINDING  OUT THE RESULTS OF YOUR TEST Not all test results are available during your visit. If your test results are not back during the visit, make an appointment with your caregiver to find out the results. Do not assume everything is normal if you have not heard from your caregiver or the medical facility. It is important for you to follow up on all of your test  results.  SEEK IMMEDIATE MEDICAL ATTENTION IF: You have more than a spotting of blood in your stool.  Your belly is swollen (abdominal distention).  You are nauseated or vomiting.  You have a temperature over 101.  You have abdominal pain or discomfort that is severe or gets worse throughout the day.       Your esophagus was stretched today   your colonoscopy could not be completed due to your colon not being cleaned out   office visit with Ermalinda Memos in 6 weeks to further evaluate   resume Eliquis today   at patient request, I called Patience Musca at [phone] -  no answer.

## 2022-10-12 NOTE — Anesthesia Preprocedure Evaluation (Signed)
Anesthesia Evaluation  Patient identified by MRN, date of birth, ID band Patient awake    Reviewed: Allergy & Precautions, H&P , NPO status , Patient's Chart, lab work & pertinent test results, reviewed documented beta blocker date and time   Airway Mallampati: II  TM Distance: >3 FB Neck ROM: full    Dental no notable dental hx.    Pulmonary neg pulmonary ROS, sleep apnea    Pulmonary exam normal breath sounds clear to auscultation       Cardiovascular Exercise Tolerance: Good hypertension, + angina  + CAD and + Past MI  negative cardio ROS + dysrhythmias  Rhythm:regular Rate:Normal     Neuro/Psych  PSYCHIATRIC DISORDERS Anxiety Depression     Neuromuscular disease negative neurological ROS  negative psych ROS   GI/Hepatic negative GI ROS, Neg liver ROS, hiatal hernia,GERD  ,,  Endo/Other  negative endocrine ROSdiabetesHypothyroidism    Renal/GU negative Renal ROS  negative genitourinary   Musculoskeletal   Abdominal   Peds  Hematology negative hematology ROS (+) Blood dyscrasia, anemia   Anesthesia Other Findings   Reproductive/Obstetrics negative OB ROS                             Anesthesia Physical Anesthesia Plan  ASA: 3  Anesthesia Plan: General   Post-op Pain Management:    Induction:   PONV Risk Score and Plan: Propofol infusion  Airway Management Planned:   Additional Equipment:   Intra-op Plan:   Post-operative Plan:   Informed Consent: I have reviewed the patients History and Physical, chart, labs and discussed the procedure including the risks, benefits and alternatives for the proposed anesthesia with the patient or authorized representative who has indicated his/her understanding and acceptance.     Dental Advisory Given  Plan Discussed with: CRNA  Anesthesia Plan Comments:        Anesthesia Quick Evaluation

## 2022-10-12 NOTE — Op Note (Signed)
[facility] Patient Name: Rhonda Garcia Procedure Date: 10/12/2022 10:18 AM MRN: [id number] Date of Birth: 11/12/1940 Attending MD: Gennette Pac , MD, [id number] CSN: [id number] Age: 82 Admit Type: Outpatient Procedure:                Attempted colonoscopy (sigmoidoscopy) Indications:              Change in bowel habits Providers:                Gennette Pac, MD, Sheran Fava, Dyann Ruddle Referring MD:              Medicines:                Propofol per Anesthesia Complications:            No immediate complications. Estimated Blood Loss:     Estimated blood loss: none. Procedure:                After obtaining informed consent, the colonoscope                            was passed under direct vision. Throughout the                            procedure, the patient's blood pressure, pulse, and                            oxygen saturations were monitored continuously. The                            [id number]) scope was introduced through the                            anus with the intention of advancing to the cecum.                            The scope was advanced to the sigmoid colon before                            the procedure was aborted. Medications were given. Scope In: 10:49:28 AM Scope Out: 10:50:34 AM Total Procedure Duration: 0 hours 1 minute 6 seconds  Findings:      The perianal and digital rectal examinations were normal. Formed stool       occluding the rectosigmoid lumen precluded further advancement of the       scope. The procedure was aborted. Impression:               Aborted attempted colonoscopy due to a poor prep                           - No specimens collected. Moderate Sedation:      Moderate (conscious) sedation was personally administered by an       anesthesia professional. The following parameters were monitored: oxygen       saturation, heart rate, blood pressure, respiratory rate,  EKG,  adequacy       of pulmonary ventilation, and response to care. Recommendation:           - Patient has a contact number available for                            emergencies. The signs and symptoms of potential                            delayed complications were discussed with the                            patient. Return to normal activities tomorrow.                            Written discharge instructions were provided to the                            patient.                           - Advance diet as tolerated.                           - Continue present medications.                           - Return to my office in 6 weeks. Procedure Code(s):        --- Professional ---                           [phone], 53, Colonoscopy, flexible; diagnostic,                            including collection of specimen(s) by brushing or                            washing, when performed (separate procedure) Diagnosis Code(s):        --- Professional ---                           R19.4, Change in bowel habit CPT copyright 2022 American Medical Association. All rights reserved. The codes documented in this report are preliminary and upon coder review may  be revised to meet current compliance requirements. Gerrit Friends. Orien Mayhall, MD Gennette Pac, MD 10/12/2022 10:58:43 AM This report has been signed electronically. Number of Addenda: 0

## 2022-10-12 NOTE — Transfer of Care (Signed)
Immediate Anesthesia Transfer of Care Note  Patient: Rhonda Garcia  Procedure(s) Performed: ESOPHAGOGASTRODUODENOSCOPY (EGD) WITH PROPOFOL MALONEY DILATION FLEXIBLE SIGMOIDOSCOPY  Patient Location: Short Stay  Anesthesia Type:General  Level of Consciousness: awake, alert , and oriented  Airway & Oxygen Therapy: Patient Spontanous Breathing  Post-op Assessment: Report given to RN, Post -op Vital signs reviewed and stable, Patient moving all extremities X 4, and Patient able to stick tongue midline  Post vital signs: Reviewed  Last Vitals:  Vitals Value Taken Time  BP 127/56   Temp 97.8   Pulse 72   Resp 19   SpO2 97     Last Pain:  Vitals:   10/12/22 1036  TempSrc:   PainSc: 0-No pain         Complications: No notable events documented.

## 2022-10-18 ENCOUNTER — Encounter (HOSPITAL_COMMUNITY): Payer: Self-pay | Admitting: Internal Medicine

## 2022-10-23 ENCOUNTER — Ambulatory Visit (INDEPENDENT_AMBULATORY_CARE_PROVIDER_SITE_OTHER): Payer: Medicare Other | Admitting: Pharmacist

## 2022-10-23 DIAGNOSIS — E1169 Type 2 diabetes mellitus with other specified complication: Secondary | ICD-10-CM

## 2022-10-23 NOTE — Progress Notes (Signed)
10/23/2022 Name: Rhonda Garcia MRN: [id number] DOB: Apr 19, 1940  Chief Complaint  Patient presents with   Diabetes    Rhonda Garcia is Garcia 82 y.o. year old female who presented for Garcia telephone visit. I connected with  Rhonda Garcia on 11/01/22 by telephone and verified that I am speaking with the correct person using two identifiers.  Patient was at home and PharmD was in PCP office during this visit    I discussed the limitations of evaluation and management by telemedicine. The patient expressed understanding and agreed to proceed.   They were referred to the pharmacist by their PCP for assistance in managing diabetes. Patient's blood sugars remain elevated and she has been unable to take SGLT2s.  She is here today to discuss alternative therapies for her Type 2 diabetes.   Subjective:  Care Team: Primary Care Provider: Junie Spencer, Garcia ; Next Scheduled Visit: 11/2022  Medication Access/Adherence  Current Pharmacy:  [facility] - Rhonda Garcia, [place] - [address] [place] [address] Phone: [phone] Fax: [phone]  Rhonda Garcia, [place] - [address] [place] [address] Phone: [phone] Fax: [phone]   Patient reports affordability concerns with their medications: Yes  Patient reports access/transportation concerns to their pharmacy: No  Patient reports adherence concerns with their medications:  No     Diabetes:  Current medications: FARXIGA Medications tried in the past: JARDIANCE, METFORMIN, JANUVIA, NOVOLOG, PIOGLITAZONE  Current glucose readings: FBG 216, 191, 210, 197 Using ONE TOUCH VERIO meter; testing 1-2 times daily  Patient denies hypoglycemic s/sx including dizziness, shakiness, sweating. Patient REPORTS hyperglycemic symptoms including polyuria, polydipsia, polyphagia, nocturia, neuropathy, blurred vision.  Current meal patterns:  The patient is asked to make an  attempt to improve diet and exercise patterns to aid in medical management of this problem. Discussed meal planning options and Plate method for healthy eating Avoid sugary drinks and desserts Incorporate balanced protein, non starchy veggies, 1 serving of carbohydrate with each meal Increase water intake Increase physical activity as able  Current physical activity: encouraged as able  Current medication access support: may need   Objective:  Lab Results  Component Value Date   HGBA1C 9.1 (H) 09/13/2022    Lab Results  Component Value Date   CREATININE 1.13 (H) 09/13/2022   BUN 17 09/13/2022   NA 142 09/13/2022   K 4.3 09/13/2022   CL 103 09/13/2022   CO2 25 09/13/2022    Lab Results  Component Value Date   CHOL 121 09/13/2022   HDL 47 09/13/2022   LDLCALC 56 09/13/2022   TRIG 92 09/13/2022   CHOLHDL 2.6 09/13/2022    Medications Reviewed Today     Reviewed by Rhonda Garcia (Pharmacist) on 10/23/22 at 1007  Med List Status: <None>   Medication Order Taking? Sig Documenting Provider Last Dose Status Informant  acetaminophen (TYLENOL) 650 MG CR tablet [id number] No Take 650 mg by mouth every 8 (eight) hours as needed for pain. Provider, Historical, Rhonda Garcia Unknown Active Self  ALPRAZolam (XANAX) 0.5 MG tablet [id number] No Take 1 tablet (0.5 mg total) by mouth daily as needed. Rhonda Garcia, [place] 10/11/2022 1000 Active Self  Baclofen 5 MG TABS [id number] No Take 1 tablet (5 mg total) by mouth 3 (three) times daily as needed. Rhonda Spencer, Garcia Unknown Active Self  blood glucose meter kit and supplies [id number] No Dispense based on patient  and insurance preference. Use up to four times daily as directed. (FOR ICD-10 E10.9, E11.9). Rhonda Spencer, Garcia Past Week Active Self  Cholecalciferol (VITAMIN D) 50 MCG (2000 UT) tablet [id number] No Take 2,000 Units by mouth once Garcia week. Provider, Historical, Rhonda Garcia Past Week Active Self  dapagliflozin propanediol (FARXIGA) 10 MG  TABS tablet [id number] No Take 1 tablet (10 mg total) by mouth daily before breakfast. Rhonda Spencer, Garcia 10/08/2022 Active Self           Med Note Rhonda Garcia   Wed Oct 10, 2022  9:11 AM) Last dose was on 7/1-per patient.  ELIQUIS 5 MG TABS tablet [id number] No TAKE ONE TABLET BY MOUTH TWICE DAILY Rhonda Rotunda, Rhonda Garcia 10/09/2022 Active Self           Med Note Rhonda Garcia   Wed Oct 10, 2022  9:12 AM) Last dose was 7/2-per patient  glucose blood (ONETOUCH VERIO) test strip [id number] No Test BS QID Dx E11.9 Rhonda Garcia Past Week Active Self  hydroxychloroquine (PLAQUENIL) 200 MG tablet [id number] No TAKE ONE (1) TABLET BY MOUTH EVERY DAY  Patient taking differently: Take 200 mg by mouth daily as needed (pain).   Rhonda Plan, Rhonda Garcia Unknown Active Self  isosorbide mononitrate (IMDUR) 60 MG 24 hr tablet [id number] No TAKE ONE (1) [address] Rhonda Garcia, [place] 10/11/2022 Active Self  Lancets [facility] DELICA PLUS [place]) MISC [id number] No Test BS QID Dx E11.9 Rhonda Spencer, Garcia Past Week Active Self  levothyroxine (SYNTHROID) 50 MCG tablet [id number] No TAKE ONE (1) [address] Rhonda Garcia, [place] 10/11/2022 Active Self  linaclotide (LINZESS) 72 MCG capsule [id number] No Take 1 capsule (72 mcg total) by mouth daily before breakfast. Rhonda Spencer, Garcia Past Week Active Self  Menthol-Methyl Salicylate (ARTHRITIS HOT EX) [id number] No Apply 1 Application topically daily as needed (pain). Provider, Historical, Rhonda Garcia Past Week Active Self  metoprolol tartrate (LOPRESSOR) 50 MG tablet [id number] No TAKE ONE TABLET BY MOUTH TWICE DAILY Rhonda Spencer, Garcia 10/11/2022 Active Self  Multiple Vitamins-Minerals (IMMUNE SUPPORT PO) [id number] No Take 1 tablet by mouth once Garcia week. Provider, Historical, Rhonda Garcia Past Week Active Self  nitroGLYCERIN (NITROSTAT) 0.4 MG SL tablet [id number] No DISSOLVE 1 TAB UNDER TOUNGE FOR CHEST PAIN. MAY REPEAT EVERY 5 MINUTES FOR 3 DOSES. IF NO RELIEF CALL 911 OR GO TO  ER Rhonda Garcia Unknown Active Self  nystatin cream (MYCOSTATIN) [id number] No Apply 1 Application topically 2 (two) times daily.  Patient taking differently: Apply 1 Application topically 2 (two) times daily as needed (yeast).   Rhonda Spencer, Garcia Past Week Active Self  ondansetron (ZOFRAN) 4 MG tablet [id number] No TAKE ONE TABLET BY MOUTH EVERY EIGHT HOURS AS NEEDED Rhonda Garcia Unknown Active Self  pantoprazole (PROTONIX) 40 MG tablet [id number] No TAKE ONE TABLET BY MOUTH TWICE DAILY Rhonda Spencer, Garcia 10/11/2022 Active Self  pravastatin (PRAVACHOL) 80 MG tablet [id number] No TAKE 1 TABLET DAILY IN THE [place], Christy Garcia, Garcia 10/11/2022 Active Self  traMADol (ULTRAM) 50 MG tablet [id number] No Take 1 tablet (50 mg total) by mouth every 8 (eight) hours as needed. Rhonda Spencer, Garcia Unknown Active Self            Assessment/Garcia:  Discontinue Marcelline Deist due to recurrent yeast/irritation New start Rybelsus 3mg  daily  patient denies personal or family history of medullary thyroid carcinoma (MTC) or in patients with  Multiple Endocrine Neoplasia syndrome type 2 (MEN 2) - Recommend to check glucose daily (fasting) or if symptomatic   Follow Up Garcia: 4 weeks 20 min of direct patient care    Kieth Brightly, PharmD, BCACP Clinical Pharmacist, [facility]

## 2022-11-01 ENCOUNTER — Other Ambulatory Visit: Payer: Self-pay

## 2022-11-01 ENCOUNTER — Encounter: Payer: Self-pay | Admitting: Orthopaedic Surgery

## 2022-11-01 ENCOUNTER — Ambulatory Visit (INDEPENDENT_AMBULATORY_CARE_PROVIDER_SITE_OTHER): Payer: Medicare Other | Admitting: Orthopaedic Surgery

## 2022-11-01 ENCOUNTER — Encounter: Payer: Self-pay | Admitting: Pharmacist

## 2022-11-01 VITALS — Ht 62.0 in | Wt 162.0 lb

## 2022-11-01 DIAGNOSIS — M170 Bilateral primary osteoarthritis of knee: Secondary | ICD-10-CM

## 2022-11-01 DIAGNOSIS — G8929 Other chronic pain: Secondary | ICD-10-CM | POA: Diagnosis not present

## 2022-11-01 DIAGNOSIS — M25562 Pain in left knee: Secondary | ICD-10-CM

## 2022-11-01 DIAGNOSIS — M25561 Pain in right knee: Secondary | ICD-10-CM | POA: Diagnosis not present

## 2022-11-01 MED ORDER — RYBELSUS 3 MG PO TABS
3.0000 mg | ORAL_TABLET | Freq: Every day | ORAL | Status: DC
Start: 2022-11-01 — End: 2022-12-04

## 2022-11-01 NOTE — Progress Notes (Signed)
Office Visit Note   Patient: Rhonda Garcia           Date of Birth: 1941/01/26           MRN: [id number] Visit Date: 11/01/2022              Requested by: Junie Spencer, FNP [address] [place],  [place] [address] PCP: Junie Spencer, FNP   Assessment & Plan: Visit Diagnoses:  1. 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  Help with injection of settle down her symptoms.  She can return if she is having ongoing symptoms.  We reviewed her MRI scan today that was done in 2021 with chronic ACL deficiency and osteoarthritis.  Follow-Up Instructions: No follow-ups on file.   Orders:  Orders Placed This Encounter  Procedures   XR KNEE 3 VIEW RIGHT   XR KNEE 3 VIEW LEFT   No orders of the defined types were placed in this encounter.     Procedures: Large Joint Inj: L knee on 11/01/2022 4:02 PM Indications: joint swelling and pain Details: 22 G 1.5 in needle, anterolateral approach  Arthrogram: No  Medications: 0.5 mL lidocaine 1 %; 3 mL bupivacaine 0.5 %; 40 mg methylPREDNISolone acetate 40 MG/ML Outcome: tolerated well, no immediate complications Procedure, treatment alternatives, risks and benefits explained, specific risks discussed. Consent was given by the patient. Immediately prior to procedure a time out was called to verify the correct patient, procedure, equipment, support staff and site/side marked as required. Patient was prepped and draped in the usual sterile fashion.       Clinical Data: No additional findings.   Subjective: Chief Complaint  Patient presents with   Right Knee - Pain   Left Knee - Pain    HPI 82 year old female with previous knee arthroscopy posterior lateral meniscal tear right knee.  She is having primarily left knee pain feels like it wants to catch it has not had true locking.  She is taken some Tylenol.  Difficulty  sleeping.  Previous left knee arthroscopy 2021 showed ACL deficient knee.  She states it feels like the left knee wants to give way.  Review of Systems all other systems updated unchanged noncontributory to HPI.   Objective: Vital Signs: Ht 5\' 2"  (1.575 m)   Wt 162 lb (73.5 kg)   BMI 29.63 kg/m   Physical Exam Constitutional:      Appearance: She is well-developed.  HENT:     Head: Normocephalic.     Right Ear: External ear normal.     Left Ear: External ear normal. There is no impacted cerumen.  Eyes:     Pupils: Pupils are equal, round, and reactive to light.  Neck:     Thyroid: No thyromegaly.     Trachea: No tracheal deviation.  Cardiovascular:     Rate and Rhythm: Normal rate.  Pulmonary:     Effort: Pulmonary effort is normal.  Abdominal:     Palpations: Abdomen is soft.  Musculoskeletal:     Cervical back: No rigidity.  Skin:    General: Skin is warm and dry.  Neurological:     Mental Status: She is alert and oriented to person, place, and time.  Psychiatric:        Behavior: Behavior normal.     Ortho Exam slight knee effusion positive pivot shift positive anterior drawer.  Medial  lateral joint line tenderness some patellofemoral crepitus with knee extension.  Specialty Comments:  No specialty comments available.  Imaging:  Study Result  Narrative & Impression  CLINICAL DATA:  Chronic left knee pain   EXAM: MRI OF THE LEFT KNEE WITHOUT CONTRAST   TECHNIQUE: Multiplanar, multisequence MR imaging of the knee was performed. No intravenous contrast was administered.   COMPARISON:  X-ray 01/20/2020, MRI 07/12/2003   FINDINGS: MENISCI   Medial meniscus: Degenerative tearing of the medial meniscal body and posterior horn. The mid body segment is medially extruded.   Lateral meniscus: Extensive degenerative tearing of the lateral meniscus with large horizontal component of the body with additional irregular radial components along the free edge.  Mild meniscal extrusion.   LIGAMENTS   Cruciates: Chronically ACL deficient knee. Intact PCL with mucoid degeneration near its femoral attachment.   Collaterals: Medial collateral ligament is intact. Lateral collateral ligament complex is intact.   CARTILAGE   Patellofemoral: Mild chondral thinning and surface irregularity of the lateral patellar facet and patellar apex.   Medial: Moderately advanced diffuse chondral thinning and surface irregularity of the medial compartment with large marginal osteophytes and associated subchondral marrow signal changes.   Lateral: Mild diffuse chondral thinning with irregular full-thickness fissure of the central weight-bearing lateral femoral condyle (series 12, image 20). Prominent marginal osteophytes.   Joint:  No joint effusion.  Fat pads within normal limits.   Popliteal Fossa:  No Baker cyst. Intact popliteus tendon.   Extensor Mechanism:  Intact quadriceps tendon and patellar tendon.   Bones: Tricompartmental joint space narrowing and marginal osteophyte formation. Reactive subchondral marrow signal changes at the periphery of the medial compartment. No acute fracture or dislocation. No suspicious bone lesion.   Other: Superficial venous varicosities.       PMFS History: Patient Active Problem List   Diagnosis Date Noted   Change in stool caliber 09/05/2022   Dysphagia 09/05/2022   Acute lateral meniscal tear 01/01/2021   Instability of right knee joint 12/15/2020   Rheumatoid factor positive 10/19/2020   Generalized osteoarthritis of multiple sites 10/19/2020   Erosive osteoarthritis of both hands 10/19/2020   Back pain with sciatica 05/04/2020   Bilateral primary osteoarthritis of knee 01/28/2020   Diabetes mellitus (HCC) 12/19/2018   History of atrial fibrillation 12/19/2018   Hx of non-ST elevation myocardial infarction (NSTEMI) 12/19/2018   Anemia 07/22/2018   Obese 11/05/2016   Vertigo 06/27/2016   GAD  (generalized anxiety disorder) 10/27/2015   Coronary artery disease involving native coronary artery of native heart with unstable angina pectoris (HCC)    Hx of adenomatous colonic polyps 06/22/2015   Hypothyroidism 01/17/2015   Vitamin D deficiency 09/09/2014   Hiatal hernia    Dysphagia, pharyngoesophageal phase    History of colonic polyps    Diverticulosis of colon without hemorrhage    Paroxysmal atrial fibrillation (HCC) 11/02/2013   Depression 10/28/2012   Lipoma of back 02/12/2012   Constipation 02/28/2011   Esophageal dysphagia 02/28/2011   Gastroesophageal reflux 02/28/2011   POSITIONAL VERTIGO 04/29/2009   Osteoarthritis 12/23/2008   Hyperlipidemia associated with type 2 diabetes mellitus (HCC) 12/21/2008   Hypertension associated with diabetes (HCC) 12/21/2008   Past Medical History:  Diagnosis Date   A-fib (HCC)    Anal fissure    resolved    Anemia    Back pain    with left radiculopathy   CAD (coronary artery disease)    Cataract    Collagen vascular disease (HCC)  DDD (degenerative disc disease)    Depression    Diabetes mellitus    Diverticulosis    Dyslipidemia    Dysrhythmia    AFib   Esophagitis    GERD (gastroesophageal reflux disease)    Hiatal hernia    Hx of peptic ulcer    Hyperlipidemia    Hypertension    Hypothyroidism    Internal hemorrhoids 2017   NSTEMI (non-ST elevated myocardial infarction) (HCC) 08/05/2015   Osteopenia    Sleep apnea    not using CPAP now, could not tolerate and PCP aware.   Thyroid disease     Family History  Problem Relation Age of Onset   Heart attack Mother    Hypertension Mother    Stroke Mother        Deceased, age 70   Diabetes Sister    Arthritis Sister    Diabetes Sister    Cancer Sister        ovarian   Stroke Sister    Alzheimer's disease Sister    Diabetes Brother        also has a blood disorder    Clotting disorder Brother    Kidney disease Brother    Heart disease Brother     Melanoma Brother        deceased   Diabetes Son    Hypertension Son    Coronary artery disease Son 62       CABG   Stroke Son    Hypertension Son    Coronary artery disease Son 27       CABG   Colon cancer Neg Hx     Past Surgical History:  Procedure Laterality Date   ABDOMINAL HYSTERECTOMY Bilateral 2001 - approximate   APPENDECTOMY     BREAST REDUCTION SURGERY     CARDIAC CATHETERIZATION N/A 08/08/2015   Procedure: Left Heart Cath and Coronary Angiography;  Surgeon: Kathleene Hazel, MD;  Location: [facility];  Service: Cardiovascular;  Laterality: N/A;   CATARACT EXTRACTION Bilateral    CHOLECYSTECTOMY  2008 - approximate   COLONOSCOPY  06/2007   Friable anal canal and anal papillae. Pancolonic diverticula. Normal terminal ileum. Status post segmental biopsy, descending colon biopsy showed minimal cryptitis. Biopsies felt to be  nonspecific but could be seen with NSAIDs. No features of inflammatory bowel disease. Stool studies were negative.   COLONOSCOPY  03/21/2011   RMR: colonic polyps treated as described above, colonic diverticulosis (Tubular adenomas)   COLONOSCOPY WITH PROPOFOL N/A 06/17/2014   RMR: Colonic diverticulosis. Multiple colonic polyps removed as described above.    COLONOSCOPY WITH PROPOFOL N/A 03/19/2016   Procedure: COLONOSCOPY WITH PROPOFOL;  Surgeon: Corbin Ade, MD;  Location: [facility];  Service: Endoscopy;  Laterality: N/A;  8:45am   COLONOSCOPY WITH PROPOFOL N/A 10/02/2018   Procedure: COLONOSCOPY WITH PROPOFOL;  Surgeon: Corbin Ade, MD;  Location: [facility];  Service: Endoscopy;  Laterality: N/A;  9:15am   CORONARY ANGIOPLASTY WITH STENT PLACEMENT     4 stents   ESOPHAGEAL DILATION N/A 06/17/2014   Procedure: ESOPHAGEAL DILATION;  Surgeon: Corbin Ade, MD;  Location: [facility];  Service: Endoscopy;  Laterality: N/AElease Hashimoto 54 Fr   ESOPHAGOGASTRODUODENOSCOPY  06/2007   Small hiatal hernia   ESOPHAGOGASTRODUODENOSCOPY   03/21/2011   RMR: normal esophagus- status post passage of Maloney dilator. Small hiatal hernia. Trivial antral and bulbar erosions   ESOPHAGOGASTRODUODENOSCOPY (EGD) WITH PROPOFOL N/A 06/17/2014   RMR: Small  hiatal hernia; otherwise normal EGD. Status post passage of a Maloney dilator. No explanation for patients right upper quadrant abdominal pain.    ESOPHAGOGASTRODUODENOSCOPY (EGD) WITH PROPOFOL N/A 03/19/2016   Procedure: ESOPHAGOGASTRODUODENOSCOPY (EGD) WITH PROPOFOL;  Surgeon: Corbin Ade, MD;  [facility];  Service: Endoscopy;  Laterality: N/A;   ESOPHAGOGASTRODUODENOSCOPY (EGD) WITH PROPOFOL N/A 10/12/2022   Procedure: ESOPHAGOGASTRODUODENOSCOPY (EGD) WITH PROPOFOL;  Surgeon: Corbin Ade, MD;  [facility];  Service: Endoscopy;  Laterality: N/A;   FLEXIBLE SIGMOIDOSCOPY N/A 10/12/2022   Procedure: FLEXIBLE SIGMOIDOSCOPY;  Surgeon: Corbin Ade, MD;  [facility];  Service: Endoscopy;  Laterality: N/A;   KNEE SURGERY Left    arthroscopy   LIPOMA EXCISION  03/17/2012   Procedure: EXCISION LIPOMA;  Surgeon: Cherylynn Ridges, MD;  Location: [facility];  Service: General;  Laterality: Right;  excision of right lower back lipoma   MALONEY DILATION N/A 03/19/2016   Procedure: Elease Hashimoto DILATION;  Surgeon: Corbin Ade, MD;  [facility];  Service: Endoscopy;  Laterality: N/A;   MALONEY DILATION N/A 10/12/2022   Procedure: Elease Hashimoto DILATION;  Surgeon: Corbin Ade, MD;  [facility];  Service: Endoscopy;  Laterality: N/A;   PATELLA FRACTURE SURGERY Right    POLYPECTOMY  06/17/2014   Procedure: POLYPECTOMY;  Surgeon: Corbin Ade, MD;  Location: [facility];  Service: Endoscopy;;   POLYPECTOMY  10/02/2018   Procedure: POLYPECTOMY;  Surgeon: Corbin Ade, MD;  [facility];  Service: Endoscopy;;  colon   REDUCTION MAMMAPLASTY Bilateral    Social History   Occupational History   Occupation: Retired     Comment: Engineer, site  Tobacco Use   Smoking status: Never   Smokeless tobacco: Never  Vaping Use   Vaping status: Never Used  Substance and Sexual Activity   Alcohol use: Not Currently    Alcohol/week: 1.0 standard drink of alcohol    Types: 1 Glasses of wine per week   Drug use: No   Sexual activity: Not Currently    Birth control/protection: Post-menopausal

## 2022-11-04 MED ORDER — METHYLPREDNISOLONE ACETATE 40 MG/ML IJ SUSP
40.0000 mg | INTRAMUSCULAR | Status: AC | PRN
Start: 2022-11-01 — End: 2022-11-01
  Administered 2022-11-01: 40 mg via INTRA_ARTICULAR

## 2022-11-04 MED ORDER — LIDOCAINE HCL 1 % IJ SOLN
0.5000 mL | INTRAMUSCULAR | Status: AC | PRN
Start: 2022-11-01 — End: 2022-11-01
  Administered 2022-11-01: .5 mL

## 2022-11-04 MED ORDER — BUPIVACAINE HCL 0.5 % IJ SOLN
3.0000 mL | INTRAMUSCULAR | Status: AC | PRN
Start: 2022-11-01 — End: 2022-11-01
  Administered 2022-11-01: 3 mL via INTRA_ARTICULAR

## 2022-11-06 ENCOUNTER — Telehealth: Payer: Self-pay | Admitting: Family

## 2022-11-09 ENCOUNTER — Ambulatory Visit (INDEPENDENT_AMBULATORY_CARE_PROVIDER_SITE_OTHER): Payer: Medicare Other | Admitting: Pharmacist

## 2022-11-09 DIAGNOSIS — Z7984 Long term (current) use of oral hypoglycemic drugs: Secondary | ICD-10-CM

## 2022-11-09 DIAGNOSIS — E1169 Type 2 diabetes mellitus with other specified complication: Secondary | ICD-10-CM

## 2022-11-09 NOTE — Progress Notes (Signed)
11/09/2022 Name: Rhonda Garcia MRN: [id number] DOB: 20-May-1940  Chief Complaint  Patient presents with   Diabetes    Rhonda Garcia is a 82 y.o. year old female who presented for a telephone visit.  I connected with  Rhonda Garcia on 11/09/22 by telephone and verified that I am speaking with the correct person using two identifiers.  I discussed the limitations of evaluation and management by telemedicine. The patient expressed understanding and agreed to proceed.  Patient was located in her home and PharmD in PCP office during this visit.   They were referred to the pharmacist by their PCP for assistance in managing diabetes.  Patient was referred to the pharmacist by their PCP for assistance in managing diabetes. Patient's blood sugars remain elevated and she has been unable to take SGLT2s.  She continues on Rybelsus (started ~1 month ago).  She denies side effects and reports her blood sugars have improved.  patient denies personal or family history of medullary thyroid carcinoma (MTC) or in patients with Multiple Endocrine Neoplasia syndrome type 2 (MEN 2)  Subjective:  Care Team: Primary Care Provider: Junie Spencer, FNP  Medication Access/Adherence  Current Pharmacy:  [facility], [place] - [address] [place] [address] Phone: [phone] Fax: [phone]  [facility] [place], [place] - [address] [place] [address] Phone: [phone] Fax: [phone]   Patient reports affordability concerns with their medications: No  Patient reports access/transportation concerns to their pharmacy: No  Patient reports adherence concerns with their medications:  No     Diabetes:  Current medications: Rybelsus Medications tried in the past: JARDIANCE, METFORMIN, JANUVIA, NOVOLOG, PIOGLITAZONE, FARXIGA  Current glucose readings: FBG <180 Using ONE TOUCH VERIO meter; testing 1-2 times daily  Patient  denies hypoglycemic s/sx including dizziness, shakiness, sweating. Patient reports hyperglycemic symptoms including polyuria, polydipsia, polyphagia, nocturia, neuropathy, blurred vision.  Current meal patterns:  Discussed meal planning options and Plate method for healthy eating Avoid sugary drinks and desserts Incorporate balanced protein, non starchy veggies, 1 serving of carbohydrate with each meal Increase water intake Increase physical activity as able   Current physical activity: encouraged as able  Current medication access support: none; may need with Rybelsus   Objective:  Lab Results  Component Value Date   HGBA1C 9.1 (H) 09/13/2022    Lab Results  Component Value Date   CREATININE 1.13 (H) 09/13/2022   BUN 17 09/13/2022   NA 142 09/13/2022   K 4.3 09/13/2022   CL 103 09/13/2022   CO2 25 09/13/2022    Lab Results  Component Value Date   CHOL 121 09/13/2022   HDL 47 09/13/2022   LDLCALC 56 09/13/2022   TRIG 92 09/13/2022   CHOLHDL 2.6 09/13/2022    Medications Reviewed Today     Reviewed by Danella Maiers, [facility] (Pharmacist) on 11/27/22 at 1217  Med List Status: <None>   Medication Order Taking? Sig Documenting Provider Last Dose Status Informant  acetaminophen (TYLENOL) 650 MG CR tablet [id number] No Take 650 mg by mouth every 8 (eight) hours as needed for pain. [provider] Unknown Active Self  ALPRAZolam (XANAX) 0.5 MG tablet [id number] No Take 1 tablet (0.5 mg total) by mouth daily as needed. Junie Spencer, [place] 10/11/2022 1000 Active Self  Baclofen 5 MG TABS [id number] No Take 1 tablet (5 mg total) by mouth 3 (three) times daily as needed.  Junie Spencer, FNP Unknown Active Self  blood glucose meter kit and supplies [id number] No Dispense based on patient and insurance preference. Use up to four times daily as directed. (FOR ICD-10 E10.9, E11.9). Junie Spencer, FNP Past Week Active Self  Cholecalciferol (VITAMIN D) 50 MCG (2000 UT) tablet  [id number] No Take 2,000 Units by mouth once a week. [provider] Past Week Active Self  ELIQUIS 5 MG TABS tablet [id number] No TAKE ONE TABLET BY MOUTH TWICE DAILY Rollene Rotunda, MD 10/09/2022 Active Self           Med Note Patsi Sears, KIM V   Wed Oct 10, 2022  9:12 AM) Last dose was 7/2-per patient  glucose blood (ONETOUCH VERIO) test strip [id number] No Test BS QID Dx E11.9 Jannifer Rodney A, FNP Past Week Active Self  hydroxychloroquine (PLAQUENIL) 200 MG tablet [id number] No TAKE ONE (1) TABLET BY MOUTH EVERY DAY  Patient taking differently: Take 200 mg by mouth daily as needed (pain).   Fuller Plan, MD Unknown Active Self  isosorbide mononitrate (IMDUR) 60 MG 24 hr tablet [id number] No TAKE ONE (1) [address] Junie Spencer, [place] 10/11/2022 Active Self  Lancets [facility] DELICA PLUS [place]) MISC [id number] No Test BS QID Dx E11.9 Junie Spencer, FNP Past Week Active Self  levothyroxine (SYNTHROID) 50 MCG tablet [id number] No TAKE ONE (1) [address] Junie Spencer, [place] 10/11/2022 Active Self  linaclotide (LINZESS) 72 MCG capsule [id number] No Take 1 capsule (72 mcg total) by mouth daily before breakfast. Junie Spencer, FNP Past Week Active Self  Menthol-Methyl Salicylate (ARTHRITIS HOT EX) [id number] No Apply 1 Application topically daily as needed (pain). [provider] Past Week Active Self  metoprolol tartrate (LOPRESSOR) 50 MG tablet [id number] No TAKE ONE TABLET BY MOUTH TWICE DAILY Junie Spencer, FNP 10/11/2022 Active Self  Multiple Vitamins-Minerals (IMMUNE SUPPORT PO) [id number] No Take 1 tablet by mouth once a week. [provider] Past Week Active Self  nitroGLYCERIN (NITROSTAT) 0.4 MG SL tablet [id number] No DISSOLVE 1 TAB UNDER TOUNGE FOR CHEST PAIN. MAY REPEAT EVERY 5 MINUTES FOR 3 DOSES. IF NO RELIEF CALL 911 OR GO TO ER Jannifer Rodney A, FNP Unknown Active Self  nystatin cream (MYCOSTATIN) [id number] No Apply 1 Application topically 2 (two)  times daily.  Patient taking differently: Apply 1 Application topically 2 (two) times daily as needed (yeast).   Junie Spencer, FNP Past Week Active Self  ondansetron (ZOFRAN) 4 MG tablet [id number] No TAKE ONE TABLET BY MOUTH EVERY EIGHT HOURS AS NEEDED Jannifer Rodney A, FNP Unknown Active Self  pantoprazole (PROTONIX) 40 MG tablet [id number] No TAKE ONE TABLET BY MOUTH TWICE DAILY Junie Spencer, FNP 10/11/2022 Active Self  pravastatin (PRAVACHOL) 80 MG tablet [id number] No TAKE 1 TABLET DAILY IN THE [place], FNP 10/11/2022 Active Self  Semaglutide (RYBELSUS) 3 MG TABS [id number]  Take 1 tablet (3 mg total) by mouth daily. POE#[id number]; EXP 09/07/23 Junie Spencer, FNP  Active   traMADol (ULTRAM) 50 MG tablet [id number] No Take 1 tablet (50 mg total) by mouth every 8 (eight) hours as needed. Junie Spencer, FNP Unknown Active Self             Assessment/Plan:   Diabetes: - Currently uncontrolled--blood sugars continue to improve - Reviewed long term cardiovascular and renal outcomes of uncontrolled blood sugar - Reviewed goal A1c, goal fasting, and goal 2 hour post prandial glucose - Reviewed dietary  modifications including FOLLOWING A HEART HEALTHY DIET/HEALTHY PLATE METHOD - Recommend to : Continue Rybelsus 3mg  daily for now; plan to increase to 7mg  daily at PharmD follow up Sample-Lot [id number]; Exp 09/07/23 - Recommend to check glucose daily (fasting) or if symptomatic   Follow Up Plan: 1 month 30 min of patient care was provided to the patient during this visit time   Kieth Brightly, PharmD, BCACP Clinical Pharmacist, [facility]

## 2022-11-15 ENCOUNTER — Ambulatory Visit: Payer: Medicare Other | Admitting: Family

## 2022-11-22 NOTE — Progress Notes (Deleted)
Referring Provider: Junie Spencer, FNP Primary Care Physician:  Junie Spencer, FNP Primary GI Physician: Dr. Jena Gauss  No chief complaint on file.   HPI:   Rhonda Garcia is a 82 y.o. female presenting today for follow-up of constipation, dysphagia, lack of appetite.  She has history of IBS-C, adenomatous colon polyps, and GERD.  Last seen in our office 09/05/2022.  Reported constipation had been doing well with Linzess 72 mcg, but packaging changed a few months ago to getting Linzess and a regular orange pill bottle without desiccant pack.  Since that time, he had been having more trouble with constipation, taking Ex-Lax as needed, typically having bowel movements every 3 days or so.  No BRBPR, melena.  She did note change in stool caliber, now within for the last 4 months.  GERD was doing well on pantoprazole 40 mg twice daily but was having pill and food dysphagia, lack of appetite, occasional nausea, rare vomiting, reported 10 pound weight loss over the last 3 months though per documented weight in the chart, her weight was stable.  Planned for EGD and colonoscopy, resume taking Linzess from container with desiccant-requested patient to let me know if constipation continued.   EGD 10/12/2022: Normal esophagus, small hiatal hernia, normal-appearing gastric mucosa, patent pylorus.  Empiric esophageal dilation was performed.   Colonoscopy 10/12/2022: Procedure aborted due to poor prep.  Prior colonoscopy 10/02/2018 with diverticulosis, two 4-5 mm polyps that were tubular adenoma and recommended no future colonoscopy unless new symptoms develop, due to age.    Today:        Dilated her esophagus today.  Colon not prep.  Procedure aborted.  Planning to see you back in about 6 weeks.  I am really not sure whether this lady really needs a colonoscopy.  Chronically constipated-a long  way from optimal bowel function.  Apparently cannot afford Linzess.   Depending on you to her get  it fixed up!  Past Medical History:  Diagnosis Date   A-fib (HCC)    Anal fissure    resolved    Anemia    Back pain    with left radiculopathy   CAD (coronary artery disease)    Cataract    Collagen vascular disease (HCC)    DDD (degenerative disc disease)    Depression    Diabetes mellitus    Diverticulosis    Dyslipidemia    Dysrhythmia    AFib   Esophagitis    GERD (gastroesophageal reflux disease)    Hiatal hernia    Hx of peptic ulcer    Hyperlipidemia    Hypertension    Hypothyroidism    Internal hemorrhoids 2017   NSTEMI (non-ST elevated myocardial infarction) (HCC) 08/05/2015   Osteopenia    Sleep apnea    not using CPAP now, could not tolerate and PCP aware.   Thyroid disease     Past Surgical History:  Procedure Laterality Date   ABDOMINAL HYSTERECTOMY Bilateral 2001 - approximate   APPENDECTOMY     BREAST REDUCTION SURGERY     CARDIAC CATHETERIZATION N/A 08/08/2015   Procedure: Left Heart Cath and Coronary Angiography;  Surgeon: Kathleene Hazel, MD;  Location: [facility] INVASIVE CV LAB;  Service: Cardiovascular;  Laterality: N/A;   CATARACT EXTRACTION Bilateral    CHOLECYSTECTOMY  2008 - approximate   COLONOSCOPY  06/2007   Friable anal canal and anal papillae. Pancolonic diverticula. Normal terminal ileum. Status post segmental biopsy, descending colon biopsy showed minimal cryptitis.  Biopsies felt to be  nonspecific but could be seen with NSAIDs. No features of inflammatory bowel disease. Stool studies were negative.   COLONOSCOPY  03/21/2011   RMR: colonic polyps treated as described above, colonic diverticulosis (Tubular adenomas)   COLONOSCOPY WITH PROPOFOL N/A 06/17/2014   RMR: Colonic diverticulosis. Multiple colonic polyps removed as described above.    COLONOSCOPY WITH PROPOFOL N/A 03/19/2016   Procedure: COLONOSCOPY WITH PROPOFOL;  Surgeon: Corbin Ade, MD;  [facility];  Service: Endoscopy;  Laterality: N/A;  8:45am    COLONOSCOPY WITH PROPOFOL N/A 10/02/2018   Procedure: COLONOSCOPY WITH PROPOFOL;  Surgeon: Corbin Ade, MD;  [facility];  Service: Endoscopy;  Laterality: N/A;  9:15am   CORONARY ANGIOPLASTY WITH STENT PLACEMENT     4 stents   ESOPHAGEAL DILATION N/A 06/17/2014   Procedure: ESOPHAGEAL DILATION;  Surgeon: Corbin Ade, MD;  Location: [facility];  Service: Endoscopy;  Laterality: N/AElease Hashimoto 54 Fr   ESOPHAGOGASTRODUODENOSCOPY  06/2007   Small hiatal hernia   ESOPHAGOGASTRODUODENOSCOPY  03/21/2011   RMR: normal esophagus- status post passage of Maloney dilator. Small hiatal hernia. Trivial antral and bulbar erosions   ESOPHAGOGASTRODUODENOSCOPY (EGD) WITH PROPOFOL N/A 06/17/2014   RMR: Small hiatal hernia; otherwise normal EGD. Status post passage of a Maloney dilator. No explanation for patients right upper quadrant abdominal pain.    ESOPHAGOGASTRODUODENOSCOPY (EGD) WITH PROPOFOL N/A 03/19/2016   Procedure: ESOPHAGOGASTRODUODENOSCOPY (EGD) WITH PROPOFOL;  Surgeon: Corbin Ade, MD;  [facility];  Service: Endoscopy;  Laterality: N/A;   ESOPHAGOGASTRODUODENOSCOPY (EGD) WITH PROPOFOL N/A 10/12/2022   Procedure: ESOPHAGOGASTRODUODENOSCOPY (EGD) WITH PROPOFOL;  Surgeon: Corbin Ade, MD;  [facility];  Service: Endoscopy;  Laterality: N/A;   FLEXIBLE SIGMOIDOSCOPY N/A 10/12/2022   Procedure: FLEXIBLE SIGMOIDOSCOPY;  Surgeon: Corbin Ade, MD;  [facility];  Service: Endoscopy;  Laterality: N/A;   KNEE SURGERY Left    arthroscopy   LIPOMA EXCISION  03/17/2012   Procedure: EXCISION LIPOMA;  Surgeon: Cherylynn Ridges, MD;  Location: [facility];  Service: General;  Laterality: Right;  excision of right lower back lipoma   MALONEY DILATION N/A 03/19/2016   Procedure: Elease Hashimoto DILATION;  Surgeon: Corbin Ade, MD;  [facility];  Service: Endoscopy;  Laterality: N/A;   MALONEY DILATION N/A 10/12/2022   Procedure: Elease Hashimoto  DILATION;  Surgeon: Corbin Ade, MD;  [facility];  Service: Endoscopy;  Laterality: N/A;   PATELLA FRACTURE SURGERY Right    POLYPECTOMY  06/17/2014   Procedure: POLYPECTOMY;  Surgeon: Corbin Ade, MD;  [facility];  Service: Endoscopy;;   POLYPECTOMY  10/02/2018   Procedure: POLYPECTOMY;  Surgeon: Corbin Ade, MD;  [facility];  Service: Endoscopy;;  colon   REDUCTION MAMMAPLASTY Bilateral     Current Outpatient Medications  Medication Sig Dispense Refill   acetaminophen (TYLENOL) 650 MG CR tablet Take 650 mg by mouth every 8 (eight) hours as needed for pain.     ALPRAZolam (XANAX) 0.5 MG tablet Take 1 tablet (0.5 mg total) by mouth daily as needed. 20 tablet 2   Baclofen 5 MG TABS Take 1 tablet (5 mg total) by mouth 3 (three) times daily as needed. 90 tablet 0   blood glucose meter kit and supplies Dispense based on patient and insurance preference. Use up to four times daily as directed. (FOR ICD-10 E10.9, E11.9). 1 each 0  Cholecalciferol (VITAMIN D) 50 MCG (2000 UT) tablet Take 2,000 Units by mouth once a week.     ELIQUIS 5 MG TABS tablet TAKE ONE TABLET BY MOUTH TWICE DAILY 60 tablet 5   glucose blood (ONETOUCH VERIO) test strip Test BS QID Dx E11.9 400 each 3   hydroxychloroquine (PLAQUENIL) 200 MG tablet TAKE ONE (1) TABLET BY MOUTH EVERY DAY (Patient taking differently: Take 200 mg by mouth daily as needed (pain).) 30 tablet 1   isosorbide mononitrate (IMDUR) 60 MG 24 hr tablet TAKE ONE (1) TABLET EACH DAY 90 tablet 1   Lancets (ONETOUCH DELICA PLUS LANCET33G) MISC Test BS QID Dx E11.9 400 each 3   levothyroxine (SYNTHROID) 50 MCG tablet TAKE ONE (1) TABLET EACH DAY 90 tablet 3   linaclotide (LINZESS) 72 MCG capsule Take 1 capsule (72 mcg total) by mouth daily before breakfast. 90 capsule 1   Menthol-Methyl Salicylate (ARTHRITIS HOT EX) Apply 1 Application topically daily as needed (pain).     metoprolol tartrate (LOPRESSOR) 50 MG tablet  TAKE ONE TABLET BY MOUTH TWICE DAILY 180 tablet 0   Multiple Vitamins-Minerals (IMMUNE SUPPORT PO) Take 1 tablet by mouth once a week.     nitroGLYCERIN (NITROSTAT) 0.4 MG SL tablet DISSOLVE 1 TAB UNDER TOUNGE FOR CHEST PAIN. MAY REPEAT EVERY 5 MINUTES FOR 3 DOSES. IF NO RELIEF CALL 911 OR GO TO ER 25 tablet 1   nystatin cream (MYCOSTATIN) Apply 1 Application topically 2 (two) times daily. (Patient taking differently: Apply 1 Application topically 2 (two) times daily as needed (yeast).) 60 g 2   ondansetron (ZOFRAN) 4 MG tablet TAKE ONE TABLET BY MOUTH EVERY EIGHT HOURS AS NEEDED 30 tablet 2   pantoprazole (PROTONIX) 40 MG tablet TAKE ONE TABLET BY MOUTH TWICE DAILY 180 tablet 0   pravastatin (PRAVACHOL) 80 MG tablet TAKE 1 TABLET DAILY IN THE EVENING 90 tablet 0   Semaglutide (RYBELSUS) 3 MG TABS Take 1 tablet (3 mg total) by mouth daily. ZOX#[id number]; EXP 09/07/23     traMADol (ULTRAM) 50 MG tablet Take 1 tablet (50 mg total) by mouth every 8 (eight) hours as needed. 20 tablet 2   No current facility-administered medications for this visit.    Allergies as of 11/23/2022 - Review Complete 11/01/2022  Allergen Reaction Noted   Fentanyl Anaphylaxis 12/10/2020   Promethazine hcl Anaphylaxis 09/06/2010   Jardiance [empagliflozin]  09/21/2022   Metformin and related  09/21/2022   Farxiga [dapagliflozin] Other (See Comments) 11/01/2022    Family History  Problem Relation Age of Onset   Heart attack Mother    Hypertension Mother    Stroke Mother        Deceased, age 76   Diabetes Sister    Arthritis Sister    Diabetes Sister    Cancer Sister        ovarian   Stroke Sister    Alzheimer's disease Sister    Diabetes Brother        also has a blood disorder    Clotting disorder Brother    Kidney disease Brother    Heart disease Brother    Melanoma Brother        deceased   Diabetes Son    Hypertension Son    Coronary artery disease Son 23       CABG   Stroke Son    Hypertension Son     Coronary artery disease Son 72       CABG   Colon  cancer Neg Hx     Social History   Socioeconomic History   Marital status: Married    Spouse name: Not on file   Number of children: 4   Years of education: 6   Highest education level: 6th grade  Occupational History   Occupation: Retired    Comment: Engineer, site  Tobacco Use   Smoking status: Never   Smokeless tobacco: Never  Vaping Use   Vaping status: Never Used  Substance and Sexual Activity   Alcohol use: Not Currently    Alcohol/week: 1.0 standard drink of alcohol    Types: 1 Glasses of wine per week   Drug use: No   Sexual activity: Not Currently    Birth control/protection: Post-menopausal  Other Topics Concern   Not on file  Social History Narrative    Has 4 adult children. She lives home with one of her sons who recently had a stroke and needs 24 hour care. She is the primary caregiver and is now legally separated from her husband.    Social Determinants of Health   Financial Resource Strain: Low Risk  (07/24/2022)   Overall Financial Resource Strain (CARDIA)    Difficulty of Paying Living Expenses: Not hard at all  Food Insecurity: No Food Insecurity (07/24/2022)   Hunger Vital Sign    Worried About Running Out of Food in the Last Year: Never true    Ran Out of Food in the Last Year: Never true  Transportation Needs: No Transportation Needs (07/24/2022)   PRAPARE - Administrator, Civil Service (Medical): No    Lack of Transportation (Non-Medical): No  Physical Activity: Insufficiently Active (07/24/2022)   Exercise Vital Sign    Days of Exercise per Week: 1 day    Minutes of Exercise per Session: 30 min  Stress: No Stress Concern Present (07/24/2022)   [name] of Occupational Health - Occupational Stress Questionnaire    Feeling of Stress : Not at all  Social Connections: Moderately Integrated (07/24/2022)   Social Connection and Isolation Panel [NHANES]    Frequency of  Communication with Friends and Family: More than three times a week    Frequency of Social Gatherings with Friends and Family: More than three times a week    Attends Religious Services: More than 4 times per year    Active Member of [employer] or Organizations: No    Attends Banker Meetings: Never    Marital Status: Married    Review of Systems: Gen: Denies fever, chills, anorexia. Denies fatigue, weakness, weight loss.  CV: Denies chest pain, palpitations, syncope, peripheral edema, and claudication. Resp: Denies dyspnea at rest, cough, wheezing, coughing up blood, and pleurisy. GI: Denies vomiting blood, jaundice, and fecal incontinence.   Denies dysphagia or odynophagia. Derm: Denies rash, itching, dry skin Psych: Denies depression, anxiety, memory loss, confusion. No homicidal or suicidal ideation.  Heme: Denies bruising, bleeding, and enlarged lymph nodes.  Physical Exam: There were no vitals taken for this visit. General:   Alert and oriented. No distress noted. Pleasant and cooperative.  Head:  Normocephalic and atraumatic. Eyes:  Conjuctiva clear without scleral icterus. Heart:  S1, S2 present without murmurs appreciated. Lungs:  Clear to auscultation bilaterally. No wheezes, rales, or rhonchi. No distress.  Abdomen:  +BS, soft, non-tender and non-distended. No rebound or guarding. No HSM or masses noted. Msk:  Symmetrical without gross deformities. Normal posture. Extremities:  Without edema. Neurologic:  Alert and  oriented x4 Psych:  Normal mood and affect.    Assessment:     Plan:  ***   Ermalinda Memos, PA-C [facility] 11/23/2022

## 2022-11-23 ENCOUNTER — Ambulatory Visit: Payer: Medicare Other | Admitting: Gastroenterology

## 2022-11-27 ENCOUNTER — Encounter: Payer: Self-pay | Admitting: Pharmacist

## 2022-12-03 ENCOUNTER — Encounter: Payer: Self-pay | Admitting: Gastroenterology

## 2022-12-04 ENCOUNTER — Ambulatory Visit (INDEPENDENT_AMBULATORY_CARE_PROVIDER_SITE_OTHER): Payer: Medicare Other | Admitting: Pharmacist

## 2022-12-04 ENCOUNTER — Telehealth: Payer: Medicare Other

## 2022-12-04 DIAGNOSIS — E1169 Type 2 diabetes mellitus with other specified complication: Secondary | ICD-10-CM

## 2022-12-04 MED ORDER — RYBELSUS 7 MG PO TABS
7.0000 mg | ORAL_TABLET | Freq: Every day | ORAL | Status: DC
Start: 2022-12-04 — End: 2023-06-17

## 2022-12-04 NOTE — Progress Notes (Signed)
   12/04/2022 Name: Rhonda Garcia MRN: [id number] DOB: 1940-05-20  Chief Complaint  Patient presents with   Diabetes    Rhonda Garcia is a 82 y.o. year old female who presented for a telephone visit. Patient call dropped after ~ 5 minutes, called back and left voicemail.   They were referred to the pharmacist by their PCP for assistance in managing diabetes and medication access.   Subjective: Pt reports that her blood sugars have been fluctuating, but she has not had much time to check because her husband is entering hospice care.   Care Team: Primary Care Provider: Junie Spencer, FNP ; Next Scheduled Visit: 12/14/22  Medication Access/Adherence  Current Pharmacy:  [facility] - [place], [place] - [address] [place] [address] Phone: [phone] Fax: [phone]  [facility] [place], [place] - [address] [place] [address] Phone: [phone] Fax: [phone]   Patient reports affordability concerns with their medications: Yes  - Medicare, on Eliquis Patient reports access/transportation concerns to their pharmacy: No  Patient reports adherence concerns with their medications:  No     Diabetes:  Current medications: Rybelsus (semaglutide) 3 mg PO daily Medications tried in the past: jardiance (empagliflozin), metformin (leg pain), januvia (sitagliptin), insulin aspart, pioglitazone, farxiga (dapaglifozin)   Pt has had recurrent yeast infections with SGLT2i in the past. Denies GI upset symptoms with Rybelsus 3 mg.   Current glucose readings: not checking frequently at the moment. Meter: ONE TOUCH VERIO meter; was previously checking 1-2 times daily  Objective:  Lab Results  Component Value Date   HGBA1C 9.1 (H) 09/13/2022    Lab Results  Component Value Date   CREATININE 1.13 (H) 09/13/2022   BUN 17 09/13/2022   NA 142 09/13/2022   K 4.3 09/13/2022   CL 103 09/13/2022   CO2 25 09/13/2022     Lab Results  Component Value Date   CHOL 121 09/13/2022   HDL 47 09/13/2022   LDLCALC 56 09/13/2022   TRIG 92 09/13/2022   CHOLHDL 2.6 09/13/2022    Medications Reviewed Today   Medications were not reviewed in this encounter     Assessment/Plan:   Diabetes: - Currently uncontrolled with last A1c of 9.1% above goal <7%. Patient doing well on semaglutide (Rybelsus) 3 mg daily - amenable to increasing to 7 mg daily. Will provide pt with samples given Medicare insurance and concomitant high cost medication (Eliquis). Unable to complete call- but left patient message instructing her to pick up sample at [facility]. Patient will be in to see PCP in ~1 week.  - Reviewed long term cardiovascular and renal outcomes of uncontrolled blood sugar - Reviewed goal A1c, goal fasting, and goal 2 hour post prandial glucose - Patient denies personal or family history of medullary thyroid carcinoma (MTC) or in patients with Multiple Endocrine Neoplasia syndrome type 2 (MEN 2)  - Recommend to increase semaglutide (Rybelsus) to 7 mg daily.   Sample provided: Rybelsus 7 mg daily LOT: [id number] EXP: 2024-01-07  Follow Up Plan: PCP 12/14/22, Pharmacist telephone ~ 2 mo  Nils Pyle, PharmD PGY1 Pharmacy Resident  Kieth Brightly, PharmD, BCACP Clinical Pharmacist, [facility]

## 2022-12-14 ENCOUNTER — Ambulatory Visit (INDEPENDENT_AMBULATORY_CARE_PROVIDER_SITE_OTHER): Payer: Medicare Other | Admitting: Family

## 2022-12-14 ENCOUNTER — Encounter: Payer: Self-pay | Admitting: Family

## 2022-12-14 VITALS — BP 138/77 | HR 78 | Temp 97.5°F | Ht 62.0 in | Wt 159.6 lb

## 2022-12-14 DIAGNOSIS — E039 Hypothyroidism, unspecified: Secondary | ICD-10-CM

## 2022-12-14 DIAGNOSIS — F411 Generalized anxiety disorder: Secondary | ICD-10-CM | POA: Diagnosis not present

## 2022-12-14 DIAGNOSIS — E1169 Type 2 diabetes mellitus with other specified complication: Secondary | ICD-10-CM | POA: Diagnosis not present

## 2022-12-14 DIAGNOSIS — M170 Bilateral primary osteoarthritis of knee: Secondary | ICD-10-CM

## 2022-12-14 DIAGNOSIS — I152 Hypertension secondary to endocrine disorders: Secondary | ICD-10-CM

## 2022-12-14 DIAGNOSIS — M159 Polyosteoarthritis, unspecified: Secondary | ICD-10-CM

## 2022-12-14 DIAGNOSIS — K219 Gastro-esophageal reflux disease without esophagitis: Secondary | ICD-10-CM

## 2022-12-14 DIAGNOSIS — M1990 Unspecified osteoarthritis, unspecified site: Secondary | ICD-10-CM

## 2022-12-14 DIAGNOSIS — F331 Major depressive disorder, recurrent, moderate: Secondary | ICD-10-CM

## 2022-12-14 DIAGNOSIS — I480 Paroxysmal atrial fibrillation: Secondary | ICD-10-CM

## 2022-12-14 DIAGNOSIS — E785 Hyperlipidemia, unspecified: Secondary | ICD-10-CM

## 2022-12-14 DIAGNOSIS — E1159 Type 2 diabetes mellitus with other circulatory complications: Secondary | ICD-10-CM

## 2022-12-14 DIAGNOSIS — K5900 Constipation, unspecified: Secondary | ICD-10-CM

## 2022-12-14 LAB — CMP14+EGFR
ALT: 9 IU/L (ref 0–32)
AST: 16 IU/L (ref 0–40)
Albumin: 4 g/dL (ref 3.7–4.7)
Alkaline Phosphatase: 98 IU/L (ref 44–121)
BUN/Creatinine Ratio: 9 — ABNORMAL LOW (ref 12–28)
BUN: 9 mg/dL (ref 8–27)
Bilirubin Total: 0.6 mg/dL (ref 0.0–1.2)
CO2: 24 mmol/L (ref 20–29)
Calcium: 9.3 mg/dL (ref 8.7–10.3)
Chloride: 105 mmol/L (ref 96–106)
Creatinine, Ser: 0.95 mg/dL (ref 0.57–1.00)
Globulin, Total: 2 g/dL (ref 1.5–4.5)
Glucose: 154 mg/dL — ABNORMAL HIGH (ref 70–99)
Potassium: 3.9 mmol/L (ref 3.5–5.2)
Sodium: 144 mmol/L (ref 134–144)
Total Protein: 6 g/dL (ref 6.0–8.5)
eGFR: 60 mL/min/{1.73_m2} (ref >59)

## 2022-12-14 LAB — BAYER DCA HB A1C WAIVED: HB A1C (BAYER DCA - WAIVED): 7.4 % — ABNORMAL HIGH (ref 4.8–5.6)

## 2022-12-14 MED ORDER — TRAMADOL HCL 50 MG PO TABS
50.0000 mg | ORAL_TABLET | Freq: Three times a day (TID) | ORAL | 2 refills | Status: DC | PRN
Start: 2022-12-14 — End: 2023-03-18

## 2022-12-14 MED ORDER — ALPRAZOLAM 0.5 MG PO TABS
0.5000 mg | ORAL_TABLET | Freq: Every day | ORAL | 2 refills | Status: DC | PRN
Start: 2022-12-14 — End: 2023-03-18

## 2022-12-14 NOTE — Progress Notes (Signed)
Subjective:    Patient ID: Rhonda Garcia, female    DOB: 10/08/1940, 82 y.o.   MRN: [id number]  Chief Complaint  Patient presents with   Medical Management of Chronic Issues    Dm meds causing private part itching and raw. Husband got told he has 3 mths to live. Patinent has a lot going on   PT presents to the office today for  chronic follow up. She is followed by Cardiologists annually for A Fib, hx NSTEMI, and CAD. Takes Eliquis.    She is followed by GI as needed for constipation, GERD.   She had elevated rheumatoid arthritis and saw a rheumatologists. She states her pain is a 8 out 10.   Her husband was given 3 months to live and she is having a hard time with this.  Arthritis Presents for follow-up visit. Affected locations include the left knee, right knee, left MCP, right MCP, left shoulder, right shoulder, left hip and right hip. Her pain is at a severity of 8/10. Associated symptoms include fatigue.  Hyperlipidemia This is a chronic problem. The current episode started more than 1 year ago. The problem is controlled. Recent lipid tests were reviewed and are normal. Current antihyperlipidemic treatment includes statins. The current treatment provides moderate improvement of lipids. Risk factors for coronary artery disease include dyslipidemia, diabetes mellitus, hypertension, a sedentary lifestyle and post-menopausal.  Gastroesophageal Reflux She complains of belching and heartburn. This is a chronic problem. The current episode started more than 1 year ago. The problem occurs occasionally. Associated symptoms include fatigue. The treatment provided moderate relief.  Thyroid Problem Presents for follow-up visit. Symptoms include anxiety, constipation, dry skin and fatigue. The symptoms have been stable. Her past medical history is significant for hyperlipidemia.  Diabetes She presents for her follow-up diabetic visit. She has type 2 diabetes mellitus. Hypoglycemia symptoms  include nervousness/anxiousness. Associated symptoms include fatigue and foot paresthesias. Pertinent negatives for diabetes include no blurred vision. Symptoms are stable. Diabetic complications include peripheral neuropathy. Risk factors for coronary artery disease include dyslipidemia, diabetes mellitus, hypertension, sedentary lifestyle and post-menopausal. She is following a generally healthy diet. (Does not check her glucose at home )  Anxiety Presents for follow-up visit. Symptoms include excessive worry, nervous/anxious behavior and panic. Symptoms occur occasionally. The severity of symptoms is mild.    Constipation This is a chronic problem. The current episode started more than 1 year ago. The problem has been waxing and waning since onset. Treatments tried: Linzess. The treatment provided moderate relief.  Depression        This is a chronic problem.  The current episode started more than 1 year ago.   The problem occurs intermittently.  Associated symptoms include fatigue, helplessness, hopelessness and sad.  Past medical history includes thyroid problem and anxiety.       Review of Systems  Constitutional:  Positive for fatigue.  Eyes:  Negative for blurred vision.  Gastrointestinal:  Positive for constipation and heartburn.  Musculoskeletal:  Positive for arthritis.  Psychiatric/Behavioral:  Positive for depression. The patient is nervous/anxious.   All other systems reviewed and are negative.      Objective:   Physical Exam Vitals reviewed.  Constitutional:      General: She is not in acute distress.    Appearance: She is well-developed.  HENT:     Head: Normocephalic and atraumatic.     Right Ear: Tympanic membrane normal.     Left Ear: Tympanic membrane normal.  Eyes:  Pupils: Pupils are equal, round, and reactive to light.  Neck:     Thyroid: No thyromegaly.  Cardiovascular:     Rate and Rhythm: Normal rate and regular rhythm.     Heart sounds: Normal  heart sounds. No murmur heard. Pulmonary:     Effort: Pulmonary effort is normal. No respiratory distress.     Breath sounds: Normal breath sounds. No wheezing.  Abdominal:     General: Bowel sounds are normal. There is no distension.     Palpations: Abdomen is soft.     Tenderness: There is no abdominal tenderness.  Musculoskeletal:        General: No tenderness. Normal range of motion.     Cervical back: Normal range of motion and neck supple.  Skin:    General: Skin is warm and dry.  Neurological:     Mental Status: She is alert and oriented to person, place, and time.     Cranial Nerves: No cranial nerve deficit.     Deep Tendon Reflexes: Reflexes are normal and symmetric.  Psychiatric:        Behavior: Behavior normal.        Thought Content: Thought content normal.        Judgment: Judgment normal.       BP 138/77   Pulse 78   Temp (!) 97.5 F (36.4 C)   Ht 5\' 2"  (1.575 m)   Wt 159 lb 9.6 oz (72.4 kg)   SpO2 96%   BMI 29.19 kg/m      Assessment & Plan:  Rhonda Garcia comes in today with chief complaint of Medical Management of Chronic Issues (Dm meds causing private part itching and raw. Husband got told he has 3 mths to live. Patinent has a lot going on)   Diagnosis and orders addressed:  1. GAD (generalized anxiety disorder) - ALPRAZolam (XANAX) 0.5 MG tablet; Take 1 tablet (0.5 mg total) by mouth daily as needed.  Dispense: 20 tablet; Refill: 2 - CMP14+EGFR  2. Osteoarthritis, unspecified osteoarthritis type, unspecified site - traMADol (ULTRAM) 50 MG tablet; Take 1 tablet (50 mg total) by mouth every 8 (eight) hours as needed.  Dispense: 20 tablet; Refill: 2 - CMP14+EGFR  3. Back pain with sciatica - traMADol (ULTRAM) 50 MG tablet; Take 1 tablet (50 mg total) by mouth every 8 (eight) hours as needed.  Dispense: 20 tablet; Refill: 2 - CMP14+EGFR  4. Bilateral primary osteoarthritis of knee - traMADol (ULTRAM) 50 MG tablet; Take 1 tablet (50 mg  total) by mouth every 8 (eight) hours as needed.  Dispense: 20 tablet; Refill: 2 - CMP14+EGFR  5. Paroxysmal atrial fibrillation (HCC) - CMP14+EGFR  6. Hypothyroidism, unspecified type - CMP14+EGFR  7. Hypertension associated with diabetes (HCC) - CMP14+EGFR  8. Hyperlipidemia associated with type 2 diabetes mellitus (HCC) - CMP14+EGFR  9. Hx of non-ST elevation myocardial infarction (NSTEMI) - CMP14+EGFR  10. Generalized osteoarthritis of multiple sites - CMP14+EGFR  11. Gastroesophageal reflux disease without esophagitis - CMP14+EGFR  12. Type 2 diabetes mellitus with other specified complication, without long-term current use of insulin (HCC) - Bayer DCA Hb A1c Waived - CMP14+EGFR  13. Moderate episode of recurrent major depressive disorder (HCC)  - CMP14+EGFR  14. Constipation, unspecified constipation type - CMP14+EGFR   Labs pending Patient reviewed in [place] controlled database, no flags noted. Contract and drug screen are up to date.  Continue medications  Health Maintenance reviewed Diet and exercise encouraged  Follow up plan: 3 months  Jannifer Rodney, FNP

## 2022-12-14 NOTE — Patient Instructions (Signed)

## 2022-12-16 NOTE — Progress Notes (Unsigned)
  Cardiology Office Note:   Date:  12/16/2022  ID:  Rhonda Garcia, DOB 10-08-1940, MRN [id number] PCP: Junie Spencer, FNP  [facility] Providers Cardiologist:  Rollene Rotunda, MD {  History of Present Illness:   Rhonda Garcia is a 82 y.o. female who presents for evaluation of known coronary disease and atrial fibrillation.   Cath for chest pain in 2017 demonstrated nonobstructive disease in large vessels with an occluded PL off the RCA.  She had patent stents in the RI, RCA and LAD.  She was managed medically.  She was at [facility] ED in 2020 with chest pain. he ruled out for MI and she had a negative Lexiscan Myoview.        ***  ***  She had to have knee surgery since I last saw her.  She has had no new cardiovascular complaints.  She says she has a lot of stress in her life.  She denies any chest discomfort, neck or arm discomfort.  She has had no new palpitations, presyncope or syncope.  She had no weight gain or edema.  She really does not notice her atrial fibrillation.  I do note that she has had some mild progressive anemia but she is not reporting any GI bleeding symptoms.   ROS: ***  Studies Reviewed:    EKG:       ***  Risk Assessment/Calculations:   {Does this patient have ATRIAL FIBRILLATION?:[id number]} No BP recorded.  {Refresh Note OR Click here to enter BP  :1}***        Physical Exam:   VS:  There were no vitals taken for this visit.   Wt Readings from Last 3 Encounters:  12/14/22 159 lb 9.6 oz (72.4 kg)  11/01/22 162 lb (73.5 kg)  10/12/22 162 lb (73.5 kg)     GEN: Well nourished, well developed in no acute distress NECK: No JVD; No carotid bruits CARDIAC: ***Irregular RR, no murmurs, rubs, gallop RESPIRATORY:  Clear to auscultation without rales, wheezing or rhonchi  ABDOMEN: Soft, non-tender, non-distended EXTREMITIES:  No edema; No deformity   ASSESSMENT AND PLAN:   ATRIAL FIB  Rhonda Garcia has a CHA2DS2 - VASc score of 5.   ***   She does have some mild anemia but no evidence of GI bleeding by her report.  Her labs were just drawn yesterday by Junie Spencer, FNP and I will defer further work-up and management.  At this point I do not think we need to stop anticoagulation.   I did send a message to her primary care suggesting that if they thought she needed to hold her anticoagulation at some point for any GI evaluation of progressive anemia feel free to do that.   CAD, NATIVE VESSEL  She had a low risk Lexiscan Myoview in 2022.     ***   She had no new symptoms.  No change in therapy.     HYPERTENSION, UNSPECIFIED The blood pressure is ***   at target.  No change in therapy.    HYPERLIPIDEMIA LDL was ***  59 with an HDL of 42.  No change in therapy.          {Are you ordering a CV Procedure (e.g. stress test, cath, DCCV, TEE, etc)?   Press F2        :[id number]}  Follow up ***  Signed, Rollene Rotunda, MD

## 2022-12-19 ENCOUNTER — Ambulatory Visit (INDEPENDENT_AMBULATORY_CARE_PROVIDER_SITE_OTHER): Payer: Medicare Other | Admitting: Cardiology

## 2022-12-19 ENCOUNTER — Encounter: Payer: Self-pay | Admitting: Cardiology

## 2022-12-19 VITALS — BP 146/78 | HR 67 | Ht 62.0 in | Wt 158.0 lb

## 2022-12-19 DIAGNOSIS — I10 Essential (primary) hypertension: Secondary | ICD-10-CM | POA: Diagnosis not present

## 2022-12-19 DIAGNOSIS — E785 Hyperlipidemia, unspecified: Secondary | ICD-10-CM

## 2022-12-19 DIAGNOSIS — I2510 Atherosclerotic heart disease of native coronary artery without angina pectoris: Secondary | ICD-10-CM

## 2022-12-19 DIAGNOSIS — I482 Chronic atrial fibrillation, unspecified: Secondary | ICD-10-CM | POA: Diagnosis not present

## 2022-12-19 NOTE — Patient Instructions (Signed)
Medication Instructions:  The current medical regimen is effective;  continue present plan and medications.  *If you need a refill on your cardiac medications before your next appointment, please call your pharmacy*   Follow-Up: At [facility], you and your health needs are our priority.  As part of our continuing mission to provide you with exceptional heart care, we have created designated Provider Care Teams.  These Care Teams include your primary Cardiologist (physician) and Advanced Practice Providers (APPs -  Physician Assistants and Nurse Practitioners) who all work together to provide you with the care you need, when you need it.  We recommend signing up for the patient portal called "MyChart".  Sign up information is provided on this After Visit Summary.  MyChart is used to connect with patients for Virtual Visits (Telemedicine).  Patients are able to view lab/test results, encounter notes, upcoming appointments, etc.  Non-urgent messages can be sent to your provider as well.   To learn more about what you can do with MyChart, go to [email or web address].    Your next appointment:   2 year(s)  Provider:   Minus Breeding, MD

## 2022-12-26 ENCOUNTER — Other Ambulatory Visit: Payer: Self-pay | Admitting: Family

## 2023-01-08 ENCOUNTER — Ambulatory Visit (INDEPENDENT_AMBULATORY_CARE_PROVIDER_SITE_OTHER): Payer: Medicare Other | Admitting: Family

## 2023-01-08 ENCOUNTER — Encounter: Payer: Self-pay | Admitting: Family

## 2023-01-08 VITALS — BP 118/76 | HR 75 | Temp 97.2°F | Ht 62.0 in | Wt 155.0 lb

## 2023-01-08 DIAGNOSIS — M150 Primary generalized (osteo)arthritis: Secondary | ICD-10-CM | POA: Diagnosis not present

## 2023-01-08 DIAGNOSIS — E119 Type 2 diabetes mellitus without complications: Secondary | ICD-10-CM | POA: Diagnosis not present

## 2023-01-08 DIAGNOSIS — K5900 Constipation, unspecified: Secondary | ICD-10-CM | POA: Diagnosis not present

## 2023-01-08 DIAGNOSIS — K2100 Gastro-esophageal reflux disease with esophagitis, without bleeding: Secondary | ICD-10-CM | POA: Diagnosis not present

## 2023-01-08 DIAGNOSIS — R300 Dysuria: Secondary | ICD-10-CM | POA: Diagnosis not present

## 2023-01-08 DIAGNOSIS — Z23 Encounter for immunization: Secondary | ICD-10-CM | POA: Diagnosis not present

## 2023-01-08 LAB — URINALYSIS, COMPLETE
Bilirubin, UA: NEGATIVE
Glucose, UA: NEGATIVE
Leukocytes,UA: NEGATIVE
Nitrite, UA: NEGATIVE
Specific Gravity, UA: >1.03 — ABNORMAL HIGH (ref 1.005–1.030)
Urobilinogen, Ur: 0.2 mg/dL (ref 0.2–1.0)
pH, UA: 5 (ref 5.0–7.5)

## 2023-01-08 LAB — MICROSCOPIC EXAMINATION
Renal Epithel, UA: NONE SEEN /[HPF]
Yeast, UA: NONE SEEN

## 2023-01-08 MED ORDER — FAMOTIDINE 40 MG PO TABS
40.0000 mg | ORAL_TABLET | Freq: Every day | ORAL | 1 refills | Status: DC
Start: 2023-01-08 — End: 2023-06-17

## 2023-01-08 MED ORDER — LINACLOTIDE 145 MCG PO CAPS
145.0000 ug | ORAL_CAPSULE | Freq: Every day | ORAL | 1 refills | Status: DC
Start: 2023-01-08 — End: 2023-06-17

## 2023-01-08 NOTE — Progress Notes (Signed)
Subjective:    Patient ID: Rhonda Garcia, female    DOB: [birth year], 82 y.o.   MRN: [id number]  Chief Complaint  Patient presents with   Gastroesophageal Reflux    When she lays down stuff runns up her throat and she gets constipation    Leg Pain   Pt presents to the office today with complaints of GERD, UTI symptoms, and left knee pain.   She is currently taking Protonix 40 mg BID. Continues to have burping, increased acid, and some times vomiting. Reports constipation.  She has Ultram as needed she takes for arthritis. She has not been taking this regularly because she takes care of her husband who is on hospice care. She states she is up all through the night and that can make her sleepy.   Gastroesophageal Reflux She complains of belching and heartburn. She reports no nausea. This is a chronic problem. The current episode started more than 1 year ago. The problem occurs occasionally. Risk factors include obesity. She has tried a PPI for the symptoms. The treatment provided mild relief.  Constipation This is a chronic problem. The current episode started more than 1 year ago. Her stool frequency is 2 to 3 times per week. Pertinent negatives include no nausea or vomiting. Risk factors include obesity. Treatments tried: linzess.  Urinary Frequency  This is a new problem. The current episode started 1 to 4 weeks ago. The problem occurs every urination. The problem has been gradually improving. The quality of the pain is described as burning. The pain is at a severity of 2/10. The pain is mild. Associated symptoms include frequency, hesitancy and urgency. Pertinent negatives include no hematuria, nausea or vomiting. She has tried increased fluids for the symptoms. The treatment provided mild relief.  Arthritis Presents for follow-up visit. She complains of pain and stiffness. Affected locations include the left knee and left hip. Her pain is at a severity of 9/10.      Review of  Systems  Gastrointestinal:  Positive for constipation and heartburn. Negative for nausea and vomiting.  Genitourinary:  Positive for frequency, hesitancy and urgency. Negative for hematuria.  Musculoskeletal:  Positive for arthritis and stiffness.  All other systems reviewed and are negative.      Objective:   Physical Exam Vitals reviewed.  Constitutional:      General: She is not in acute distress.    Appearance: She is well-developed. She is obese.  HENT:     Head: Normocephalic and atraumatic.     Right Ear: Tympanic membrane normal.     Left Ear: Tympanic membrane normal.  Eyes:     Pupils: Pupils are equal, round, and reactive to light.  Neck:     Thyroid: No thyromegaly.  Cardiovascular:     Rate and Rhythm: Normal rate and regular rhythm.     Heart sounds: Normal heart sounds. No murmur heard. Pulmonary:     Effort: Pulmonary effort is normal. No respiratory distress.     Breath sounds: Normal breath sounds. No wheezing.  Abdominal:     General: Bowel sounds are normal. There is no distension.     Palpations: Abdomen is soft.     Tenderness: There is no abdominal tenderness.  Musculoskeletal:        General: No tenderness. Normal range of motion.     Cervical back: Normal range of motion and neck supple.  Skin:    General: Skin is warm and dry.  Neurological:  Mental Status: She is alert and oriented to person, place, and time.     Cranial Nerves: No cranial nerve deficit.     Deep Tendon Reflexes: Reflexes are normal and symmetric.  Psychiatric:        Behavior: Behavior normal.        Thought Content: Thought content normal.        Judgment: Judgment normal.          BP 118/76   Pulse 75   Temp (!) 97.2 F (36.2 C) (Temporal)   Ht 5\' 2"  (1.575 m)   Wt 155 lb (70.3 kg)   SpO2 94%   BMI 28.35 kg/m   Assessment & Plan:  Rhonda Garcia comes in today with chief complaint of Gastroesophageal Reflux (When she lays down stuff runns up her  throat and she gets constipation ) and Leg Pain   Diagnosis and orders addressed:  1. Dysuria Urine culture pending Force fluids - Urine Culture - Urinalysis, Complete  2. Gastroesophageal reflux disease with esophagitis without hemorrhage Will add Pepcid -Diet discussed- Avoid fried, spicy, citrus foods, caffeine and alcohol -Do not eat 2-3 hours before bedtime -Encouraged small frequent meals -Avoid NSAID's  - famotidine (PEPCID) 40 MG tablet; Take 1 tablet (40 mg total) by mouth daily.  Dispense: 90 tablet; Refill: 1  3. Constipation, unspecified constipation type Will increase linzess to 145 mcg from 72 mcg - linaclotide (LINZESS) 145 MCG CAPS capsule; Take 1 capsule (145 mcg total) by mouth daily before breakfast.  Dispense: 90 capsule; Refill: 1  4. Primary osteoarthritis involving multiple joints Take Ultram 25 mg to see if this helps with sedation    Labs pending Health Maintenance reviewed Diet and exercise encouraged  Follow up plan: Keep chronic follow up  Jannifer Rodney, FNP

## 2023-01-08 NOTE — Patient Instructions (Signed)
Gastroesophageal Reflux Disease, Adult    Gastroesophageal reflux (GER) happens when acid from the stomach flows up into the tube that connects the mouth and the stomach (esophagus). Normally, food travels down the esophagus and stays in the stomach to be digested. However, when a person has GER, food and stomach acid sometimes move back up into the esophagus. If this becomes a more serious problem, the person may be diagnosed with a disease called gastroesophageal reflux disease (GERD). GERD occurs when the reflux:  Happens often.  Causes frequent or severe symptoms.  Causes problems such as damage to the esophagus.  When stomach acid comes in contact with the esophagus, the acid may cause inflammation in the esophagus. Over time, GERD may create small holes (ulcers) in the lining of the esophagus.  What are the causes?  This condition is caused by a problem with the muscle between the esophagus and the stomach (lower esophageal sphincter, or LES). Normally, the LES muscle closes after food passes through the esophagus to the stomach. When the LES is weakened or abnormal, it does not close properly, and that allows food and stomach acid to go back up into the esophagus.  The LES can be weakened by certain dietary substances, medicines, and medical conditions, including:  Tobacco use.  Pregnancy.  Having a hiatal hernia.  Alcohol use.  Certain foods and beverages, such as coffee, chocolate, onions, and peppermint.  What increases the risk?  You are more likely to develop this condition if you:  Have an increased body weight.  Have a connective tissue disorder.  Take NSAIDs, such as ibuprofen.  What are the signs or symptoms?  Symptoms of this condition include:  Heartburn.  Difficult or painful swallowing and the feeling of having a lump in the throat.  A bitter taste in the mouth.  Bad breath and having a large amount of saliva.  Having an upset or bloated stomach and belching.  Chest pain. Different conditions can  cause chest pain. Make sure you see your health care provider if you experience chest pain.  Shortness of breath or wheezing.  Ongoing (chronic) cough or a nighttime cough.  Wearing away of tooth enamel.  Weight loss.  How is this diagnosed?  This condition may be diagnosed based on a medical history and a physical exam. To determine if you have mild or severe GERD, your health care provider may also monitor how you respond to treatment. You may also have tests, including:  A test to examine your stomach and esophagus with a small camera (endoscopy).  A test that measures the acidity level in your esophagus.  A test that measures how much pressure is on your esophagus.  A barium swallow or modified barium swallow test to show the shape, size, and functioning of your esophagus.  How is this treated?  Treatment for this condition may vary depending on how severe your symptoms are. Your health care provider may recommend:  Changes to your diet.  Medicine.  Surgery.  The goal of treatment is to help relieve your symptoms and to prevent complications.  Follow these instructions at home:  Eating and drinking    Follow a diet as recommended by your health care provider. This may involve avoiding foods and drinks such as:  Coffee and tea, with or without caffeine.  Drinks that contain alcohol.  Energy drinks and sports drinks.  Carbonated drinks or sodas.  Chocolate and cocoa.  Peppermint and mint flavorings.  Garlic and onions.  Horseradish.  Spicy and acidic foods, including peppers, chili powder, curry powder, vinegar, hot sauces, and barbecue sauce.  Citrus fruit juices and citrus fruits, such as oranges, lemons, and limes.  Tomato-based foods, such as red sauce, chili, salsa, and pizza with red sauce.  Fried and fatty foods, such as donuts, french fries, potato chips, and high-fat dressings.  High-fat meats, such as hot dogs and fatty cuts of red and white meats, such as rib eye steak, sausage, ham, and  bacon.  High-fat dairy items, such as whole milk, butter, and cream cheese.  Eat small, frequent meals instead of large meals.  Avoid drinking large amounts of liquid with your meals.  Avoid eating meals during the 2-3 hours before bedtime.  Avoid lying down right after you eat.  Do not exercise right after you eat.  Lifestyle    Do not use any products that contain nicotine or tobacco. These products include cigarettes, chewing tobacco, and vaping devices, such as e-cigarettes. If you need help quitting, ask your health care provider.  Try to reduce your stress by using methods such as yoga or meditation. If you need help reducing stress, ask your health care provider.  If you are overweight, reduce your weight to an amount that is healthy for you. Ask your health care provider for guidance about a safe weight loss goal.  General instructions  Pay attention to any changes in your symptoms.  Take over-the-counter and prescription medicines only as told by your health care provider. Do not take aspirin, ibuprofen, or other NSAIDs unless your health care provider told you to take these medicines.  Wear loose-fitting clothing. Do not wear anything tight around your waist that causes pressure on your abdomen.  Raise (elevate) the head of your bed about 6 inches (15 cm). You can use a wedge to do this.  Avoid bending over if this makes your symptoms worse.  Keep all follow-up visits. This is important.  Contact a health care provider if:  You have:  New symptoms.  Unexplained weight loss.  Difficulty swallowing or it hurts to swallow.  Wheezing or a persistent cough.  A hoarse voice.  Your symptoms do not improve with treatment.  Get help right away if:  You have sudden pain in your arms, neck, jaw, teeth, or back.  You suddenly feel sweaty, dizzy, or light-headed.  You have chest pain or shortness of breath.  You vomit and the vomit is green, yellow, or black, or it looks like blood or coffee grounds.  You faint.  You  have stool that is red, bloody, or black.  You cannot swallow, drink, or eat.  These symptoms may represent a serious problem that is an emergency. Do not wait to see if the symptoms will go away. Get medical help right away. Call your local emergency services (911 in the [place] Do not drive yourself to the hospital.  Summary  Gastroesophageal reflux happens when acid from the stomach flows up into the esophagus. GERD is a disease in which the reflux happens often, causes frequent or severe symptoms, or causes problems such as damage to the esophagus.  Treatment for this condition may vary depending on how severe your symptoms are. Your health care provider may recommend diet and lifestyle changes, medicine, or surgery.  Contact a health care provider if you have new or worsening symptoms.  Take over-the-counter and prescription medicines only as told by your health care provider. Do not take aspirin, ibuprofen, or other NSAIDs  unless your health care provider told you to do so.  Keep all follow-up visits as told by your health care provider. This is important.  This information is not intended to replace advice given to you by your health care provider. Make sure you discuss any questions you have with your health care provider.  Document Revised: 10/03/2019 Document Reviewed: 10/05/2019  Elsevier Patient Education  2024 [employer].

## 2023-01-10 LAB — URINE CULTURE

## 2023-01-14 ENCOUNTER — Other Ambulatory Visit: Payer: Self-pay | Admitting: Cardiology

## 2023-01-14 ENCOUNTER — Other Ambulatory Visit: Payer: Self-pay | Admitting: Family

## 2023-01-14 DIAGNOSIS — E785 Hyperlipidemia, unspecified: Secondary | ICD-10-CM

## 2023-01-14 DIAGNOSIS — I480 Paroxysmal atrial fibrillation: Secondary | ICD-10-CM

## 2023-01-14 DIAGNOSIS — K219 Gastro-esophageal reflux disease without esophagitis: Secondary | ICD-10-CM

## 2023-01-14 NOTE — Telephone Encounter (Signed)
Prescription refill request for Eliquis received. Indication:afib Last office visit:9/24 Scr:0.95 Age: 82 Weight:70.3  kg  Prescription refilled

## 2023-01-22 ENCOUNTER — Telehealth: Payer: Self-pay | Admitting: Family

## 2023-01-22 NOTE — Telephone Encounter (Signed)
Pt came in to office to see if Raynelle Fanning had left her any samples of Rybelsus because she is completely out. Did not see any samples set aside for her and we dont have any samples in the sample closet. Needs advise on what to do?

## 2023-01-24 NOTE — Telephone Encounter (Signed)
Unfortunately we do not have any samples I do not have any other answer for her at this point, she can call back next week and see if Raynelle Fanning has any samples then but she will probably have to just purchase the medication from the pharmacy.

## 2023-01-24 NOTE — Telephone Encounter (Signed)
Can covering provider advise on this? Patient is calling about it.

## 2023-01-25 NOTE — Telephone Encounter (Signed)
One box found and put up front for patient she is aware

## 2023-03-18 ENCOUNTER — Encounter: Payer: Self-pay | Admitting: Family

## 2023-03-18 ENCOUNTER — Ambulatory Visit (INDEPENDENT_AMBULATORY_CARE_PROVIDER_SITE_OTHER): Payer: Medicare Other | Admitting: Family

## 2023-03-18 VITALS — BP 118/79 | HR 93 | Temp 97.4°F | Ht 62.0 in | Wt 148.4 lb

## 2023-03-18 DIAGNOSIS — M25562 Pain in left knee: Secondary | ICD-10-CM

## 2023-03-18 DIAGNOSIS — K5900 Constipation, unspecified: Secondary | ICD-10-CM

## 2023-03-18 DIAGNOSIS — E1169 Type 2 diabetes mellitus with other specified complication: Secondary | ICD-10-CM

## 2023-03-18 DIAGNOSIS — E1159 Type 2 diabetes mellitus with other circulatory complications: Secondary | ICD-10-CM

## 2023-03-18 DIAGNOSIS — M1990 Unspecified osteoarthritis, unspecified site: Secondary | ICD-10-CM

## 2023-03-18 DIAGNOSIS — I152 Hypertension secondary to endocrine disorders: Secondary | ICD-10-CM

## 2023-03-18 DIAGNOSIS — M159 Polyosteoarthritis, unspecified: Secondary | ICD-10-CM

## 2023-03-18 DIAGNOSIS — I252 Old myocardial infarction: Secondary | ICD-10-CM

## 2023-03-18 DIAGNOSIS — I25110 Atherosclerotic heart disease of native coronary artery with unstable angina pectoris: Secondary | ICD-10-CM

## 2023-03-18 DIAGNOSIS — E039 Hypothyroidism, unspecified: Secondary | ICD-10-CM

## 2023-03-18 DIAGNOSIS — M170 Bilateral primary osteoarthritis of knee: Secondary | ICD-10-CM

## 2023-03-18 DIAGNOSIS — F411 Generalized anxiety disorder: Secondary | ICD-10-CM | POA: Diagnosis not present

## 2023-03-18 DIAGNOSIS — F331 Major depressive disorder, recurrent, moderate: Secondary | ICD-10-CM

## 2023-03-18 DIAGNOSIS — I480 Paroxysmal atrial fibrillation: Secondary | ICD-10-CM

## 2023-03-18 DIAGNOSIS — G8929 Other chronic pain: Secondary | ICD-10-CM

## 2023-03-18 DIAGNOSIS — K219 Gastro-esophageal reflux disease without esophagitis: Secondary | ICD-10-CM

## 2023-03-18 DIAGNOSIS — E559 Vitamin D deficiency, unspecified: Secondary | ICD-10-CM

## 2023-03-18 LAB — BAYER DCA HB A1C WAIVED: HB A1C (BAYER DCA - WAIVED): 6.1 % — ABNORMAL HIGH (ref 4.8–5.6)

## 2023-03-18 MED ORDER — ALPRAZOLAM 0.5 MG PO TABS: 0.5000 mg | ORAL_TABLET | Freq: Every day | ORAL | 2 refills | Status: DC | PRN

## 2023-03-18 MED ORDER — BUPIVACAINE HCL 0.25 % IJ SOLN
1.0000 mL | Freq: Once | INTRAMUSCULAR | Status: AC
Start: 2023-03-18 — End: 2023-03-18
  Administered 2023-03-18: 1 mL via INTRA_ARTICULAR

## 2023-03-18 MED ORDER — METHYLPREDNISOLONE ACETATE 80 MG/ML IJ SUSP
80.0000 mg | Freq: Once | INTRAMUSCULAR | Status: AC
Start: 2023-03-18 — End: 2023-03-18
  Administered 2023-03-18: 80 mg via INTRA_ARTICULAR

## 2023-03-18 MED ORDER — TRAMADOL HCL 50 MG PO TABS: 50.0000 mg | ORAL_TABLET | Freq: Three times a day (TID) | ORAL | 2 refills | Status: DC | PRN

## 2023-03-18 NOTE — Progress Notes (Signed)
Subjective:    Patient ID: Rhonda Garcia, female    DOB: October 10, 1940, 82 y.o.   MRN: [id number]  Chief Complaint  Patient presents with   Medical Management of Chronic Issues   PT presents to the office today for  chronic follow up. She is followed by Cardiologists annually for A Fib, hx NSTEMI, and CAD. Takes Eliquis.    She is followed by GI as needed for constipation, GERD.   She had elevated rheumatoid arthritis and saw a rheumatologists. She states her pain is a 10 out 10.    Her husband was given 3 months to live and she is having a hard time with this.  Hypertension This is a chronic problem. The current episode started more than 1 year ago. The problem has been resolved since onset. The problem is controlled. Associated symptoms include anxiety and malaise/fatigue. Pertinent negatives include no blurred vision, peripheral edema or shortness of breath. Risk factors for coronary artery disease include dyslipidemia, diabetes mellitus, obesity and sedentary lifestyle. The current treatment provides moderate improvement. Identifiable causes of hypertension include a thyroid problem.  Gastroesophageal Reflux She complains of belching and heartburn. She reports no hoarse voice. This is a chronic problem. The current episode started more than 1 year ago. The problem occurs occasionally. Associated symptoms include fatigue. She has tried a PPI for the symptoms. The treatment provided moderate relief.  Diabetes She presents for her follow-up diabetic visit. She has type 2 diabetes mellitus. Hypoglycemia symptoms include nervousness/anxiousness. Associated symptoms include fatigue and foot paresthesias. Pertinent negatives for diabetes include no blurred vision. Symptoms are stable. Diabetic complications include peripheral neuropathy. Risk factors for coronary artery disease include dyslipidemia, diabetes mellitus, hypertension, sedentary lifestyle and post-menopausal. She is following a  generally healthy diet. (Does not check glucose)  Thyroid Problem Presents for follow-up visit. Symptoms include anxiety, constipation, depressed mood and fatigue. Patient reports no diarrhea or hoarse voice. The symptoms have been stable. Her past medical history is significant for hyperlipidemia.  Hyperlipidemia This is a chronic problem. The current episode started more than 1 year ago. The problem is controlled. Exacerbating diseases include obesity. Pertinent negatives include no shortness of breath. Current antihyperlipidemic treatment includes statins. The current treatment provides moderate improvement of lipids. Risk factors for coronary artery disease include dyslipidemia, diabetes mellitus, hypertension, a sedentary lifestyle and post-menopausal.  Arthritis Presents for follow-up visit. She complains of pain and stiffness. The symptoms have been stable. Affected locations include the left knee and right knee. Her pain is at a severity of 10/10. Associated symptoms include fatigue. Pertinent negatives include no diarrhea.  Anxiety Presents for follow-up visit. Symptoms include depressed mood, excessive worry, insomnia, irritability, nervous/anxious behavior and restlessness. Patient reports no shortness of breath. Symptoms occur occasionally. The severity of symptoms is moderate.    Depression        This is a chronic problem.  The current episode started more than 1 year ago.   The problem occurs intermittently.  Associated symptoms include fatigue, insomnia, restlessness and sad.  Associated symptoms include no helplessness and no hopelessness.  Past medical history includes thyroid problem and anxiety.   Constipation This is a chronic problem. The current episode started more than 1 year ago. The problem has been waxing and waning since onset. Her stool frequency is 2 to 3 times per week. Pertinent negatives include no diarrhea. She has tried diet changes and stool softeners for the  symptoms. The treatment provided moderate relief.  Review of Systems  Constitutional:  Positive for fatigue, irritability and malaise/fatigue.  HENT:  Negative for hoarse voice.   Eyes:  Negative for blurred vision.  Respiratory:  Negative for shortness of breath.   Gastrointestinal:  Positive for constipation and heartburn. Negative for diarrhea.  Musculoskeletal:  Positive for arthritis and stiffness.  Psychiatric/Behavioral:  Positive for depression. The patient is nervous/anxious and has insomnia.   All other systems reviewed and are negative.      Objective:   Physical Exam Vitals reviewed.  Constitutional:      General: She is not in acute distress.    Appearance: She is well-developed. She is obese.  HENT:     Head: Normocephalic and atraumatic.     Right Ear: Tympanic membrane normal.     Left Ear: Tympanic membrane normal.  Eyes:     Pupils: Pupils are equal, round, and reactive to light.  Neck:     Thyroid: No thyromegaly.  Cardiovascular:     Rate and Rhythm: Normal rate and regular rhythm.     Heart sounds: Normal heart sounds. No murmur heard. Pulmonary:     Effort: Pulmonary effort is normal. No respiratory distress.     Breath sounds: Normal breath sounds. No wheezing.  Abdominal:     General: Bowel sounds are normal. There is no distension.     Palpations: Abdomen is soft.     Tenderness: There is no abdominal tenderness.  Musculoskeletal:        General: Tenderness present.     Cervical back: Normal range of motion and neck supple.     Comments: Pain in left knee flexion and extension   Skin:    General: Skin is warm and dry.  Neurological:     Mental Status: She is alert and oriented to person, place, and time.     Cranial Nerves: No cranial nerve deficit.     Deep Tendon Reflexes: Reflexes are normal and symmetric.  Psychiatric:        Behavior: Behavior normal.        Thought Content: Thought content normal.        Judgment: Judgment  normal.     leftknee prepped with betadine Injected with Marcaine .5% plain and methylprednisolone and prednisone with 22 guage needle x 1. Patient tolerated well.   BP 118/79   Pulse 93   Temp (!) 97.4 F (36.3 C) (Temporal)   Ht 5\' 2"  (1.575 m)   Wt 148 lb 6.4 oz (67.3 kg)   SpO2 98%   BMI 27.14 kg/m      Assessment & Plan:  NYYA DUSKIN comes in today with chief complaint of Medical Management of Chronic Issues   Diagnosis and orders addressed:  1. GAD (generalized anxiety disorder) - ALPRAZolam (XANAX) 0.5 MG tablet; Take 1 tablet (0.5 mg total) by mouth daily as needed.  Dispense: 20 tablet; Refill: 2 - CMP14+EGFR - CBC with Differential/Platelet  2. Osteoarthritis, unspecified osteoarthritis type, unspecified site - traMADol (ULTRAM) 50 MG tablet; Take 1 tablet (50 mg total) by mouth every 8 (eight) hours as needed.  Dispense: 20 tablet; Refill: 2 - bupivacaine (MARCAINE) 0.25 % (with pres) injection 1 mL - methylPREDNISolone acetate (DEPO-MEDROL) injection 80 mg - CMP14+EGFR - CBC with Differential/Platelet  3. Bilateral primary osteoarthritis of knee - traMADol (ULTRAM) 50 MG tablet; Take 1 tablet (50 mg total) by mouth every 8 (eight) hours as needed.  Dispense: 20 tablet; Refill: 2 - CMP14+EGFR - CBC with  Differential/Platelet  4. Hyperlipidemia associated with type 2 diabetes mellitus (HCC) - CMP14+EGFR - CBC with Differential/Platelet  5. Hypertension associated with diabetes (HCC) - CMP14+EGFR - CBC with Differential/Platelet  6. Constipation, unspecified constipation type - CMP14+EGFR - CBC with Differential/Platelet  7. Gastroesophageal reflux disease without esophagitis - CMP14+EGFR - CBC with Differential/Platelet  8. Moderate episode of recurrent major depressive disorder (HCC) - CMP14+EGFR - CBC with Differential/Platelet  9. Paroxysmal atrial fibrillation (HCC) - CMP14+EGFR - CBC with Differential/Platelet  10.  Hypothyroidism, unspecified type - CMP14+EGFR - CBC with Differential/Platelet  11. Vitamin D deficiency - CMP14+EGFR - CBC with Differential/Platelet  12. Coronary artery disease involving native coronary artery of native heart with unstable angina pectoris (HCC)  - CMP14+EGFR - CBC with Differential/Platelet  13. Type 2 diabetes mellitus with other specified complication, without long-term current use of insulin (HCC) - Bayer DCA Hb A1c Waived - CMP14+EGFR - CBC with Differential/Platelet  14. Hx of non-ST elevation myocardial infarction (NSTEMI) - CMP14+EGFR - CBC with Differential/Platelet  15. Generalized osteoarthritis of multiple sites - CMP14+EGFR - CBC with Differential/Platelet  16. Chronic pain of left knee - bupivacaine (MARCAINE) 0.25 % (with pres) injection 1 mL - methylPREDNISolone acetate (DEPO-MEDROL) injection 80 mg - CMP14+EGFR - CBC with Differential/Platelet   Labs pending Patient reviewed in [place] controlled database, no flags noted. Contract and drug screen are up to date.  Continue current medications  Health Maintenance reviewed Diet and exercise encouraged  Follow up plan: 3 months   Jannifer Rodney, FNP

## 2023-03-18 NOTE — Patient Instructions (Signed)

## 2023-03-19 LAB — CMP14+EGFR
ALT: 8 [IU]/L (ref 0–32)
AST: 16 [IU]/L (ref 0–40)
Albumin: 4.1 g/dL (ref 3.7–4.7)
Alkaline Phosphatase: 80 [IU]/L (ref 44–121)
BUN/Creatinine Ratio: 10 — ABNORMAL LOW (ref 12–28)
BUN: 10 mg/dL (ref 8–27)
Bilirubin Total: 1 mg/dL (ref 0.0–1.2)
CO2: 23 mmol/L (ref 20–29)
Calcium: 9.5 mg/dL (ref 8.7–10.3)
Chloride: 107 mmol/L — ABNORMAL HIGH (ref 96–106)
Creatinine, Ser: 1.03 mg/dL — ABNORMAL HIGH (ref 0.57–1.00)
Globulin, Total: 2 g/dL (ref 1.5–4.5)
Glucose: 118 mg/dL — ABNORMAL HIGH (ref 70–99)
Potassium: 4.2 mmol/L (ref 3.5–5.2)
Sodium: 145 mmol/L — ABNORMAL HIGH (ref 134–144)
Total Protein: 6.1 g/dL (ref 6.0–8.5)
eGFR: 55 mL/min/{1.73_m2} — ABNORMAL LOW (ref >59)

## 2023-03-19 LAB — CBC WITH DIFFERENTIAL/PLATELET
Basophils Absolute: 0.1 10*3/uL (ref 0.0–0.2)
Basos: 1 %
EOS (ABSOLUTE): 0.1 10*3/uL (ref 0.0–0.4)
Eos: 2 %
Hematocrit: 37.8 % (ref 34.0–46.6)
Hemoglobin: 12.2 g/dL (ref 11.1–15.9)
Immature Grans (Abs): 0 10*3/uL (ref 0.0–0.1)
Immature Granulocytes: 0 %
Lymphocytes Absolute: 1.2 10*3/uL (ref 0.7–3.1)
Lymphs: 27 %
MCH: 30.2 pg (ref 26.6–33.0)
MCHC: 32.3 g/dL (ref 31.5–35.7)
MCV: 94 fL (ref 79–97)
Monocytes Absolute: 0.5 10*3/uL (ref 0.1–0.9)
Monocytes: 11 %
Neutrophils Absolute: 2.7 10*3/uL (ref 1.4–7.0)
Neutrophils: 59 %
Platelets: 130 10*3/uL — ABNORMAL LOW (ref 150–450)
RBC: 4.04 x10E6/uL (ref 3.77–5.28)
RDW: 14 % (ref 11.7–15.4)
WBC: 4.6 10*3/uL (ref 3.4–10.8)

## 2023-04-16 ENCOUNTER — Telehealth: Payer: Self-pay | Admitting: Pharmacist

## 2023-04-16 NOTE — Telephone Encounter (Signed)
 Please make sure patient is re-enrolled in Novo Rybelsus 7mg  (hasn't received for this year) Thanks!! Raynelle Fanning

## 2023-04-24 ENCOUNTER — Other Ambulatory Visit: Payer: Self-pay | Admitting: Family

## 2023-04-24 DIAGNOSIS — K219 Gastro-esophageal reflux disease without esophagitis: Secondary | ICD-10-CM

## 2023-04-24 DIAGNOSIS — E1169 Type 2 diabetes mellitus with other specified complication: Secondary | ICD-10-CM

## 2023-04-26 ENCOUNTER — Other Ambulatory Visit (HOSPITAL_COMMUNITY): Payer: Self-pay

## 2023-04-26 ENCOUNTER — Telehealth: Payer: Self-pay

## 2023-04-26 NOTE — Progress Notes (Deleted)
 Pharmacy Medication Assistance Program Note    04/26/2023  Patient ID: Rhonda Garcia, female   DOB: [birth year], 83 y.o.   MRN: [id number]     04/26/2023  [employer]  Manufacturer Medication One [employer] [facility] Rybelsus  Dose of Rybelsus 7MG   Type of Sport and exercise psychologist  Date Application Submitted to [employer] 04/26/2023  Method Application Sent to [employer]. PCP pg sent to [place].

## 2023-04-26 NOTE — Telephone Encounter (Signed)
Renewal submitted, new encounter created.

## 2023-05-16 NOTE — Telephone Encounter (Signed)
 PROVIDER PORTION E-SIGNED ON 05/16/23

## 2023-05-28 ENCOUNTER — Ambulatory Visit: Payer: Medicare Other | Admitting: Internal Medicine

## 2023-05-28 NOTE — Progress Notes (Deleted)
 Referring Provider: Junie Spencer, FNP Primary Care Physician:  Junie Spencer, FNP Primary GI Physician: Dr. Bonnetta Barry chief complaint on file.   HPI:   Rhonda Garcia is a 83 y.o. female with history of IBS-C, adenomatous colon polyps, GERD, presenting today with chief complaint of ***  Last seen in our office 09/05/2022.  Reported constipation had been doing well with Linzess 72 mcg, but packaging changed a few months ago to getting Linzess and a regular orange pill bottle without desiccant pack.  Since that time, he had been having more trouble with constipation, taking Ex-Lax as needed, typically having bowel movements every 3 days or so.  No BRBPR, melena.  She did note change in stool caliber, now within for the last 4 months.  GERD was doing well on pantoprazole 40 mg twice daily but was having pill and food dysphagia, lack of appetite, occasional nausea, rare vomiting, reported 10 pound weight loss over the last 3 months though per documented weight in the chart, her weight was stable.  Planned for EGD and colonoscopy, resume taking Linzess from container with desiccant-requested patient to let me know if constipation continued.     EGD 10/12/2022: Normal esophagus, small hiatal hernia, normal-appearing gastric mucosa, patent pylorus.  Empiric esophageal dilation was performed.    Colonoscopy 10/12/2022: Procedure aborted due to poor prep.   Prior colonoscopy 10/02/2018 with diverticulosis, two 4-5 mm polyps that were tubular adenoma and recommended no future colonoscopy unless new symptoms develop, due to age.      Today: Constipation:   Dysphagia:        Past Medical History:  Diagnosis Date   A-fib (HCC)    Anal fissure    resolved    Anemia    Back pain    with left radiculopathy   CAD (coronary artery disease)    Cataract    Collagen vascular disease (HCC)    DDD (degenerative disc disease)    Depression    Diabetes mellitus    Diverticulosis     Dyslipidemia    Dysrhythmia    AFib   Esophagitis    GERD (gastroesophageal reflux disease)    Hiatal hernia    Hx of peptic ulcer    Hyperlipidemia    Hypertension    Hypothyroidism    Internal hemorrhoids 2017   NSTEMI (non-ST elevated myocardial infarction) (HCC) 08/05/2015   Osteopenia    Sleep apnea    not using CPAP now, could not tolerate and PCP aware.   Thyroid disease     Past Surgical History:  Procedure Laterality Date   ABDOMINAL HYSTERECTOMY Bilateral 2001 - approximate   APPENDECTOMY     BREAST REDUCTION SURGERY     CARDIAC CATHETERIZATION N/A 08/08/2015   Procedure: Left Heart Cath and Coronary Angiography;  Surgeon: Kathleene Hazel, MD;  Location: [facility] INVASIVE CV LAB;  Service: Cardiovascular;  Laterality: N/A;   CATARACT EXTRACTION Bilateral    CHOLECYSTECTOMY  2008 - approximate   COLONOSCOPY  06/2007   Friable anal canal and anal papillae. Pancolonic diverticula. Normal terminal ileum. Status post segmental biopsy, descending colon biopsy showed minimal cryptitis. Biopsies felt to be  nonspecific but could be seen with NSAIDs. No features of inflammatory bowel disease. Stool studies were negative.   COLONOSCOPY  03/21/2011   RMR: colonic polyps treated as described above, colonic diverticulosis (Tubular adenomas)   COLONOSCOPY WITH PROPOFOL N/A 06/17/2014   RMR: Colonic diverticulosis. Multiple colonic polyps removed as  described above.    COLONOSCOPY WITH PROPOFOL N/A 03/19/2016   Procedure: COLONOSCOPY WITH PROPOFOL;  Surgeon: Corbin Ade, MD;  Location: [facility];  Service: Endoscopy;  Laterality: N/A;  8:45am   COLONOSCOPY WITH PROPOFOL N/A 10/02/2018   Procedure: COLONOSCOPY WITH PROPOFOL;  Surgeon: Corbin Ade, MD;  Location: [facility];  Service: Endoscopy;  Laterality: N/A;  9:15am   CORONARY ANGIOPLASTY WITH STENT PLACEMENT     4 stents   ESOPHAGEAL DILATION N/A 06/17/2014   Procedure: ESOPHAGEAL DILATION;  Surgeon: Corbin Ade, MD;  Location: [facility];  Service: Endoscopy;  Laterality: N/AElease Hashimoto 54 Fr   ESOPHAGOGASTRODUODENOSCOPY  06/2007   Small hiatal hernia   ESOPHAGOGASTRODUODENOSCOPY  03/21/2011   RMR: normal esophagus- status post passage of Maloney dilator. Small hiatal hernia. Trivial antral and bulbar erosions   ESOPHAGOGASTRODUODENOSCOPY (EGD) WITH PROPOFOL N/A 06/17/2014   RMR: Small hiatal hernia; otherwise normal EGD. Status post passage of a Maloney dilator. No explanation for patients right upper quadrant abdominal pain.    ESOPHAGOGASTRODUODENOSCOPY (EGD) WITH PROPOFOL N/A 03/19/2016   Procedure: ESOPHAGOGASTRODUODENOSCOPY (EGD) WITH PROPOFOL;  Surgeon: Corbin Ade, MD;  [facility];  Service: Endoscopy;  Laterality: N/A;   ESOPHAGOGASTRODUODENOSCOPY (EGD) WITH PROPOFOL N/A 10/12/2022   Procedure: ESOPHAGOGASTRODUODENOSCOPY (EGD) WITH PROPOFOL;  Surgeon: Corbin Ade, MD;  [facility];  Service: Endoscopy;  Laterality: N/A;   FLEXIBLE SIGMOIDOSCOPY N/A 10/12/2022   Procedure: FLEXIBLE SIGMOIDOSCOPY;  Surgeon: Corbin Ade, MD;  [facility];  Service: Endoscopy;  Laterality: N/A;   KNEE SURGERY Left    arthroscopy   LIPOMA EXCISION  03/17/2012   Procedure: EXCISION LIPOMA;  Surgeon: Cherylynn Ridges, MD;  Location: [facility];  Service: General;  Laterality: Right;  excision of right lower back lipoma   MALONEY DILATION N/A 03/19/2016   Procedure: Elease Hashimoto DILATION;  Surgeon: Corbin Ade, MD;  [facility];  Service: Endoscopy;  Laterality: N/A;   MALONEY DILATION N/A 10/12/2022   Procedure: Elease Hashimoto DILATION;  Surgeon: Corbin Ade, MD;  [facility];  Service: Endoscopy;  Laterality: N/A;   PATELLA FRACTURE SURGERY Right    POLYPECTOMY  06/17/2014   Procedure: POLYPECTOMY;  Surgeon: Corbin Ade, MD;  Location: [facility];  Service: Endoscopy;;   POLYPECTOMY  10/02/2018   Procedure: POLYPECTOMY;  Surgeon: Corbin Ade, MD;  [facility];  Service: Endoscopy;;  colon   REDUCTION MAMMAPLASTY Bilateral     Current Outpatient Medications  Medication Sig Dispense Refill   acetaminophen (TYLENOL) 650 MG CR tablet Take 650 mg by mouth every 8 (eight) hours as needed for pain.     ALPRAZolam (XANAX) 0.5 MG tablet Take 1 tablet (0.5 mg total) by mouth daily as needed. 20 tablet 2   Baclofen 5 MG TABS Take 1 tablet (5 mg total) by mouth 3 (three) times daily as needed. 90 tablet 0   blood glucose meter kit and supplies Dispense based on patient and insurance preference. Use up to four times daily as directed. (FOR ICD-10 E10.9, E11.9). 1 each 0   Cholecalciferol (VITAMIN D) 50 MCG (2000 UT) tablet Take 2,000 Units by mouth once a week.     ELIQUIS 5 MG TABS tablet TAKE ONE TABLET BY MOUTH TWICE DAILY 60 tablet 5   famotidine (PEPCID) 40 MG tablet Take 1 tablet (40 mg total) by mouth daily. 90 tablet 1  glucose blood (ONETOUCH VERIO) test strip Test BS QID Dx E11.9 400 each 3   hydroxychloroquine (PLAQUENIL) 200 MG tablet TAKE ONE (1) TABLET BY MOUTH EVERY DAY (Patient taking differently: Take 200 mg by mouth daily as needed (pain).) 30 tablet 1   isosorbide mononitrate (IMDUR) 60 MG 24 hr tablet TAKE ONE (1) TABLET EACH DAY 90 tablet 1   Lancets (ONETOUCH DELICA PLUS LANCET33G) MISC Test BS QID Dx E11.9 400 each 3   levothyroxine (SYNTHROID) 50 MCG tablet TAKE ONE (1) TABLET EACH DAY 90 tablet 3   linaclotide (LINZESS) 145 MCG CAPS capsule Take 1 capsule (145 mcg total) by mouth daily before breakfast. 90 capsule 1   Menthol-Methyl Salicylate (ARTHRITIS HOT EX) Apply 1 Application topically daily as needed (pain).     metoprolol tartrate (LOPRESSOR) 50 MG tablet TAKE ONE TABLET BY MOUTH TWICE DAILY 180 tablet 0   Multiple Vitamins-Minerals (IMMUNE SUPPORT PO) Take 1 tablet by mouth once a week.     nitroGLYCERIN (NITROSTAT) 0.4 MG SL tablet DISSOLVE 1 TAB UNDER TOUNGE FOR CHEST PAIN. MAY REPEAT  EVERY 5 MINUTES FOR 3 DOSES. IF NO RELIEF CALL 911 OR GO TO ER 25 tablet 1   nystatin cream (MYCOSTATIN) Apply 1 Application topically 2 (two) times daily. (Patient taking differently: Apply 1 Application topically 2 (two) times daily as needed (yeast).) 60 g 2   ondansetron (ZOFRAN) 4 MG tablet TAKE ONE TABLET BY MOUTH EVERY EIGHT HOURS AS NEEDED 30 tablet 2   pantoprazole (PROTONIX) 40 MG tablet TAKE ONE TABLET BY MOUTH TWICE DAILY 180 tablet 0   pravastatin (PRAVACHOL) 80 MG tablet TAKE 1 TABLET DAILY IN THE EVENING 90 tablet 0   Semaglutide (RYBELSUS) 7 MG TABS Take 1 tablet (7 mg total) by mouth daily. LOT: [id number]; EXP: 2024-01-07 30 tablet    traMADol (ULTRAM) 50 MG tablet Take 1 tablet (50 mg total) by mouth every 8 (eight) hours as needed. 20 tablet 2   No current facility-administered medications for this visit.    Allergies as of 05/29/2023 - Review Complete 05/16/2023  Allergen Reaction Noted   Fentanyl Anaphylaxis 12/10/2020   Promethazine hcl Anaphylaxis 09/06/2010   Jardiance [empagliflozin]  09/21/2022   Metformin and related  09/21/2022   Farxiga [dapagliflozin] Other (See Comments) 11/01/2022    Family History  Problem Relation Age of Onset   Heart attack Mother    Hypertension Mother    Stroke Mother        Deceased, age 1   Diabetes Sister    Arthritis Sister    Diabetes Sister    Cancer Sister        ovarian   Stroke Sister    Alzheimer's disease Sister    Diabetes Brother        also has a blood disorder    Clotting disorder Brother    Kidney disease Brother    Heart disease Brother    Melanoma Brother        deceased   Diabetes Son    Hypertension Son    Coronary artery disease Son 3       CABG   Stroke Son    Hypertension Son    Coronary artery disease Son 89       CABG   Colon cancer Neg Hx     Social History   Socioeconomic History   Marital status: Married    Spouse name: Not on file   Number of children: 4   Years  of education: 6    Highest education level: 6th grade  Occupational History   Occupation: Retired    Comment: Engineer, site  Tobacco Use   Smoking status: Never   Smokeless tobacco: Never  Vaping Use   Vaping status: Never Used  Substance and Sexual Activity   Alcohol use: Not Currently    Alcohol/week: 1.0 standard drink of alcohol    Types: 1 Glasses of wine per week   Drug use: No   Sexual activity: Not Currently    Birth control/protection: Post-menopausal  Other Topics Concern   Not on file  Social History Narrative    Has 4 adult children. She lives home with one of her sons who recently had a stroke and needs 24 hour care. She is the primary caregiver and is now legally separated from her husband.    Social Drivers of Corporate investment banker Strain: Low Risk  (07/24/2022)   Overall Financial Resource Strain (CARDIA)    Difficulty of Paying Living Expenses: Not hard at all  Food Insecurity: No Food Insecurity (07/24/2022)   Hunger Vital Sign    Worried About Running Out of Food in the Last Year: Never true    Ran Out of Food in the Last Year: Never true  Transportation Needs: No Transportation Needs (07/24/2022)   PRAPARE - Administrator, Civil Service (Medical): No    Lack of Transportation (Non-Medical): No  Physical Activity: Insufficiently Active (07/24/2022)   Exercise Vital Sign    Days of Exercise per Week: 1 day    Minutes of Exercise per Session: 30 min  Stress: No Stress Concern Present (07/24/2022)   [name] of Occupational Health - Occupational Stress Questionnaire    Feeling of Stress : Not at all  Social Connections: Moderately Integrated (07/24/2022)   Social Connection and Isolation Panel [NHANES]    Frequency of Communication with Friends and Family: More than three times a week    Frequency of Social Gatherings with Friends and Family: More than three times a week    Attends Religious Services: More than 4 times per year    Active Member of  [employer] or Organizations: No    Attends Banker Meetings: Never    Marital Status: Married    Review of Systems: Gen: Denies fever, chills, anorexia. Denies fatigue, weakness, weight loss.  CV: Denies chest pain, palpitations, syncope, peripheral edema, and claudication. Resp: Denies dyspnea at rest, cough, wheezing, coughing up blood, and pleurisy. GI: Denies vomiting blood, jaundice, and fecal incontinence.   Denies dysphagia or odynophagia. Derm: Denies rash, itching, dry skin Psych: Denies depression, anxiety, memory loss, confusion. No homicidal or suicidal ideation.  Heme: Denies bruising, bleeding, and enlarged lymph nodes.  Physical Exam: There were no vitals taken for this visit. General:   Alert and oriented. No distress noted. Pleasant and cooperative.  Head:  Normocephalic and atraumatic. Eyes:  Conjuctiva clear without scleral icterus. Heart:  S1, S2 present without murmurs appreciated. Lungs:  Clear to auscultation bilaterally. No wheezes, rales, or rhonchi. No distress.  Abdomen:  +BS, soft, non-tender and non-distended. No rebound or guarding. No HSM or masses noted. Msk:  Symmetrical without gross deformities. Normal posture. Extremities:  Without edema. Neurologic:  Alert and  oriented x4 Psych:  Normal mood and affect.    Assessment:     Plan:  ***   Ermalinda Memos, PA-C [facility] 05/29/2023

## 2023-05-29 ENCOUNTER — Ambulatory Visit: Payer: Medicare Other | Admitting: Gastroenterology

## 2023-05-29 ENCOUNTER — Encounter: Payer: Self-pay | Admitting: Gastroenterology

## 2023-06-11 ENCOUNTER — Other Ambulatory Visit: Payer: Self-pay | Admitting: Family

## 2023-06-11 DIAGNOSIS — R112 Nausea with vomiting, unspecified: Secondary | ICD-10-CM

## 2023-06-15 NOTE — Progress Notes (Unsigned)
 GI Office Note    Referring Provider: Junie Spencer, FNP Primary Care Physician:  Junie Spencer, FNP  Primary Gastroenterologist:  Chief Complaint   No chief complaint on file.   History of Present Illness   Rhonda Garcia is a 83 y.o. female presenting today     with history of IBS-C, adenomatous colon polyps, GERD, presenting today with chief complaint of ***  Last seen in our office 09/05/2022.  Reported constipation had been doing well with Linzess 72 mcg, but packaging changed a few months ago to getting Linzess and a regular orange pill bottle without desiccant pack.  Since that time, he had been having more trouble with constipation, taking Ex-Lax as needed, typically having bowel movements every 3 days or so.  No BRBPR, melena.  She did note change in stool caliber, now within for the last 4 months.  GERD was doing well on pantoprazole 40 mg twice daily but was having pill and food dysphagia, lack of appetite, occasional nausea, rare vomiting, reported 10 pound weight loss over the last 3 months though per documented weight in the chart, her weight was stable.  Planned for EGD and colonoscopy, resume taking Linzess from container with desiccant-requested patient to let me know if constipation continued.     EGD 10/12/2022: Normal esophagus, small hiatal hernia, normal-appearing gastric mucosa, patent pylorus.  Empiric esophageal dilation was performed.    Colonoscopy 10/12/2022: Procedure aborted due to poor prep.   Prior colonoscopy 10/02/2018 with diverticulosis, two 4-5 mm polyps that were tubular adenoma and recommended no future colonoscopy unless new symptoms develop, due to age.      Today: Constipation:   Dysphagia:           Medications   Current Outpatient Medications  Medication Sig Dispense Refill   acetaminophen (TYLENOL) 650 MG CR tablet Take 650 mg by mouth every 8 (eight) hours as needed for pain.     ALPRAZolam (XANAX) 0.5 MG  tablet Take 1 tablet (0.5 mg total) by mouth daily as needed. 20 tablet 2   Baclofen 5 MG TABS Take 1 tablet (5 mg total) by mouth 3 (three) times daily as needed. 90 tablet 0   blood glucose meter kit and supplies Dispense based on patient and insurance preference. Use up to four times daily as directed. (FOR ICD-10 E10.9, E11.9). 1 each 0   Cholecalciferol (VITAMIN D) 50 MCG (2000 UT) tablet Take 2,000 Units by mouth once a week.     ELIQUIS 5 MG TABS tablet TAKE ONE TABLET BY MOUTH TWICE DAILY 60 tablet 5   famotidine (PEPCID) 40 MG tablet Take 1 tablet (40 mg total) by mouth daily. 90 tablet 1   glucose blood (ONETOUCH VERIO) test strip Test BS QID Dx E11.9 400 each 3   hydroxychloroquine (PLAQUENIL) 200 MG tablet TAKE ONE (1) TABLET BY MOUTH EVERY DAY (Patient taking differently: Take 200 mg by mouth daily as needed (pain).) 30 tablet 1   isosorbide mononitrate (IMDUR) 60 MG 24 hr tablet TAKE ONE (1) TABLET EACH DAY 90 tablet 1   Lancets (ONETOUCH DELICA PLUS LANCET33G) MISC Test BS QID Dx E11.9 400 each 3   levothyroxine (SYNTHROID) 50 MCG tablet TAKE ONE (1) TABLET EACH DAY 90 tablet 3   linaclotide (LINZESS) 145 MCG CAPS capsule Take 1 capsule (145 mcg total) by mouth daily before breakfast. 90 capsule 1   Menthol-Methyl Salicylate (ARTHRITIS HOT EX) Apply 1 Application topically daily as needed (pain).  metoprolol tartrate (LOPRESSOR) 50 MG tablet TAKE ONE TABLET BY MOUTH TWICE DAILY 180 tablet 0   Multiple Vitamins-Minerals (IMMUNE SUPPORT PO) Take 1 tablet by mouth once a week.     nitroGLYCERIN (NITROSTAT) 0.4 MG SL tablet DISSOLVE 1 TAB UNDER TOUNGE FOR CHEST PAIN. MAY REPEAT EVERY 5 MINUTES FOR 3 DOSES. IF NO RELIEF CALL 911 OR GO TO ER 25 tablet 1   nystatin cream (MYCOSTATIN) Apply 1 Application topically 2 (two) times daily. (Patient taking differently: Apply 1 Application topically 2 (two) times daily as needed (yeast).) 60 g 2   ondansetron (ZOFRAN) 4 MG tablet TAKE ONE TABLET  BY MOUTH EVERY EIGHT HOURS AS NEEDED 30 tablet 2   pantoprazole (PROTONIX) 40 MG tablet TAKE ONE TABLET BY MOUTH TWICE DAILY 180 tablet 0   pravastatin (PRAVACHOL) 80 MG tablet TAKE 1 TABLET DAILY IN THE EVENING 90 tablet 0   Semaglutide (RYBELSUS) 7 MG TABS Take 1 tablet (7 mg total) by mouth daily. LOT: [id number]; EXP: 2024-01-07 30 tablet    traMADol (ULTRAM) 50 MG tablet Take 1 tablet (50 mg total) by mouth every 8 (eight) hours as needed. 20 tablet 2   No current facility-administered medications for this visit.    Allergies   Allergies as of 06/17/2023 - Review Complete 05/16/2023  Allergen Reaction Noted   Fentanyl Anaphylaxis 12/10/2020   Promethazine hcl Anaphylaxis 09/06/2010   Jardiance [empagliflozin]  09/21/2022   Metformin and related  09/21/2022   Farxiga [dapagliflozin] Other (See Comments) 11/01/2022     Past Medical History   Past Medical History:  Diagnosis Date   A-fib (HCC)    Anal fissure    resolved    Anemia    Back pain    with left radiculopathy   CAD (coronary artery disease)    Cataract    Collagen vascular disease (HCC)    DDD (degenerative disc disease)    Depression    Diabetes mellitus    Diverticulosis    Dyslipidemia    Dysrhythmia    AFib   Esophagitis    GERD (gastroesophageal reflux disease)    Hiatal hernia    Hx of peptic ulcer    Hyperlipidemia    Hypertension    Hypothyroidism    Internal hemorrhoids 2017   NSTEMI (non-ST elevated myocardial infarction) (HCC) 08/05/2015   Osteopenia    Sleep apnea    not using CPAP now, could not tolerate and PCP aware.   Thyroid disease     Past Surgical History   Past Surgical History:  Procedure Laterality Date   ABDOMINAL HYSTERECTOMY Bilateral 2001 - approximate   APPENDECTOMY     BREAST REDUCTION SURGERY     CARDIAC CATHETERIZATION N/A 08/08/2015   Procedure: Left Heart Cath and Coronary Angiography;  Surgeon: Kathleene Hazel, MD;  Location: [facility] INVASIVE [facility];  Service:  Cardiovascular;  Laterality: N/A;   CATARACT EXTRACTION Bilateral    CHOLECYSTECTOMY  2008 - approximate   COLONOSCOPY  06/2007   Friable anal canal and anal papillae. Pancolonic diverticula. Normal terminal ileum. Status post segmental biopsy, descending colon biopsy showed minimal cryptitis. Biopsies felt to be  nonspecific but could be seen with NSAIDs. No features of inflammatory bowel disease. Stool studies were negative.   COLONOSCOPY  03/21/2011   RMR: colonic polyps treated as described above, colonic diverticulosis (Tubular adenomas)   COLONOSCOPY WITH PROPOFOL N/A 06/17/2014   RMR: Colonic diverticulosis. Multiple colonic polyps removed as described above.    COLONOSCOPY  WITH PROPOFOL N/A 03/19/2016   Procedure: COLONOSCOPY WITH PROPOFOL;  Surgeon: Corbin Ade, MD;  Location: [facility];  Service: Endoscopy;  Laterality: N/A;  8:45am   COLONOSCOPY WITH PROPOFOL N/A 10/02/2018   Procedure: COLONOSCOPY WITH PROPOFOL;  Surgeon: Corbin Ade, MD;  Location: [facility];  Service: Endoscopy;  Laterality: N/A;  9:15am   CORONARY ANGIOPLASTY WITH STENT PLACEMENT     4 stents   ESOPHAGEAL DILATION N/A 06/17/2014   Procedure: ESOPHAGEAL DILATION;  Surgeon: Corbin Ade, MD;  Location: [facility];  Service: Endoscopy;  Laterality: N/AElease Hashimoto 54 Fr   ESOPHAGOGASTRODUODENOSCOPY  06/2007   Small hiatal hernia   ESOPHAGOGASTRODUODENOSCOPY  03/21/2011   RMR: normal esophagus- status post passage of Maloney dilator. Small hiatal hernia. Trivial antral and bulbar erosions   ESOPHAGOGASTRODUODENOSCOPY (EGD) WITH PROPOFOL N/A 06/17/2014   RMR: Small hiatal hernia; otherwise normal EGD. Status post passage of a Maloney dilator. No explanation for patients right upper quadrant abdominal pain.    ESOPHAGOGASTRODUODENOSCOPY (EGD) WITH PROPOFOL N/A 03/19/2016   Procedure: ESOPHAGOGASTRODUODENOSCOPY (EGD) WITH PROPOFOL;  Surgeon: Corbin Ade, MD;  Location: [facility];  Service: Endoscopy;   Laterality: N/A;   ESOPHAGOGASTRODUODENOSCOPY (EGD) WITH PROPOFOL N/A 10/12/2022   Procedure: ESOPHAGOGASTRODUODENOSCOPY (EGD) WITH PROPOFOL;  Surgeon: Corbin Ade, MD;  Location: [facility];  Service: Endoscopy;  Laterality: N/A;   FLEXIBLE SIGMOIDOSCOPY N/A 10/12/2022   Procedure: FLEXIBLE SIGMOIDOSCOPY;  Surgeon: Corbin Ade, MD;  Location: [facility];  Service: Endoscopy;  Laterality: N/A;   KNEE SURGERY Left    arthroscopy   LIPOMA EXCISION  03/17/2012   Procedure: EXCISION LIPOMA;  Surgeon: Cherylynn Ridges, MD;  Location: [facility];  Service: General;  Laterality: Right;  excision of right lower back lipoma   MALONEY DILATION N/A 03/19/2016   Procedure: Elease Hashimoto DILATION;  Surgeon: Corbin Ade, MD;  Location: [facility];  Service: Endoscopy;  Laterality: N/A;   MALONEY DILATION N/A 10/12/2022   Procedure: Elease Hashimoto DILATION;  Surgeon: Corbin Ade, MD;  Location: [facility];  Service: Endoscopy;  Laterality: N/A;   PATELLA FRACTURE SURGERY Right    POLYPECTOMY  06/17/2014   Procedure: POLYPECTOMY;  Surgeon: Corbin Ade, MD;  Location: [facility];  Service: Endoscopy;;   POLYPECTOMY  10/02/2018   Procedure: POLYPECTOMY;  Surgeon: Corbin Ade, MD;  Location: [facility];  Service: Endoscopy;;  colon   REDUCTION MAMMAPLASTY Bilateral     Past Family History   Family History  Problem Relation Age of Onset   Heart attack Mother    Hypertension Mother    Stroke Mother        Deceased, age 48   Diabetes Sister    Arthritis Sister    Diabetes Sister    Cancer Sister        ovarian   Stroke Sister    Alzheimer's disease Sister    Diabetes Brother        also has a blood disorder    Clotting disorder Brother    Kidney disease Brother    Heart disease Brother    Melanoma Brother        deceased   Diabetes Son    Hypertension Son    Coronary artery disease Son 44       CABG   Stroke Son    Hypertension Son    Coronary artery disease Son  40  CABG   Colon cancer Neg Hx     Past Social History   Social History   Socioeconomic History   Marital status: Married    Spouse name: Not on file   Number of children: 4   Years of education: 6   Highest education level: 6th grade  Occupational History   Occupation: Retired    Comment: Engineer, site  Tobacco Use   Smoking status: Never   Smokeless tobacco: Never  Vaping Use   Vaping status: Never Used  Substance and Sexual Activity   Alcohol use: Not Currently    Alcohol/week: 1.0 standard drink of alcohol    Types: 1 Glasses of wine per week   Drug use: No   Sexual activity: Not Currently    Birth control/protection: Post-menopausal  Other Topics Concern   Not on file  Social History Narrative    Has 4 adult children. She lives home with one of her sons who recently had a stroke and needs 24 hour care. She is the primary caregiver and is now legally separated from her husband.    Social Drivers of Corporate investment banker Strain: Low Risk  (07/24/2022)   Overall Financial Resource Strain (CARDIA)    Difficulty of Paying Living Expenses: Not hard at all  Food Insecurity: No Food Insecurity (07/24/2022)   Hunger Vital Sign    Worried About Running Out of Food in the Last Year: Never true    Ran Out of Food in the Last Year: Never true  Transportation Needs: No Transportation Needs (07/24/2022)   PRAPARE - Administrator, Civil Service (Medical): No    Lack of Transportation (Non-Medical): No  Physical Activity: Insufficiently Active (07/24/2022)   Exercise Vital Sign    Days of Exercise per Week: 1 day    Minutes of Exercise per Session: 30 min  Stress: No Stress Concern Present (07/24/2022)   [name] of Occupational Health - Occupational Stress Questionnaire    Feeling of Stress : Not at all  Social Connections: Moderately Integrated (07/24/2022)   Social Connection and Isolation Panel [NHANES]    Frequency of Communication with  Friends and Family: More than three times a week    Frequency of Social Gatherings with Friends and Family: More than three times a week    Attends Religious Services: More than 4 times per year    Active Member of [employer] or Organizations: No    Attends Banker Meetings: Never    Marital Status: Married  Catering manager Violence: Not At Risk (07/24/2022)   Humiliation, Afraid, Rape, and Kick questionnaire    Fear of Current or Ex-Partner: No    Emotionally Abused: No    Physically Abused: No    Sexually Abused: No    Review of Systems   General: Negative for anorexia, weight loss, fever, chills, fatigue, weakness. ENT: Negative for hoarseness, difficulty swallowing , nasal congestion. CV: Negative for chest pain, angina, palpitations, dyspnea on exertion, peripheral edema.  Respiratory: Negative for dyspnea at rest, dyspnea on exertion, cough, sputum, wheezing.  GI: See history of present illness. GU:  Negative for dysuria, hematuria, urinary incontinence, urinary frequency, nocturnal urination.  Endo: Negative for unusual weight change.     Physical Exam   There were no vitals taken for this visit.   General: Well-nourished, well-developed in no acute distress.  Eyes: No icterus. Mouth: Oropharyngeal mucosa moist and pink , no lesions erythema or exudate. Lungs: Clear to auscultation  bilaterally.  Heart: Regular rate and rhythm, no murmurs rubs or gallops.  Abdomen: Bowel sounds are normal, nontender, nondistended, no hepatosplenomegaly or masses,  no abdominal bruits or hernia , no rebound or guarding.  Rectal: ***  Extremities: No lower extremity edema. No clubbing or deformities. Neuro: Alert and oriented x 4   Skin: Warm and dry, no jaundice.   Psych: Alert and cooperative, normal mood and affect.  Labs   Lab Results  Component Value Date   NA 145 (H) 03/18/2023   CL 107 (H) 03/18/2023   K 4.2 03/18/2023   CO2 23 03/18/2023   BUN 10 03/18/2023    CREATININE 1.03 (H) 03/18/2023   EGFR 55 (L) 03/18/2023   CALCIUM 9.5 03/18/2023   ALBUMIN 4.1 03/18/2023   GLUCOSE 118 (H) 03/18/2023   Lab Results  Component Value Date   ALT 8 03/18/2023   AST 16 03/18/2023   ALKPHOS 80 03/18/2023   BILITOT 1.0 03/18/2023   Lab Results  Component Value Date   HGBA1C 6.1 (H) 03/18/2023    Imaging Studies   No results found.  Assessment       PLAN   ***   Leanna Battles. Melvyn Neth, MHS, PA-C [facility]

## 2023-06-17 ENCOUNTER — Ambulatory Visit: Payer: Medicare Other | Admitting: Gastroenterology

## 2023-06-17 ENCOUNTER — Encounter: Payer: Self-pay | Admitting: Family

## 2023-06-17 ENCOUNTER — Ambulatory Visit (INDEPENDENT_AMBULATORY_CARE_PROVIDER_SITE_OTHER): Payer: Medicare Other | Admitting: Family

## 2023-06-17 ENCOUNTER — Ambulatory Visit
Admission: RE | Admit: 2023-06-17 | Discharge: 2023-06-17 | Disposition: A | Discharge status: Home / Self Care | Payer: Medicare Other | Source: Ambulatory Visit | Attending: Family | Admitting: Family

## 2023-06-17 ENCOUNTER — Encounter: Payer: Self-pay | Admitting: Gastroenterology

## 2023-06-17 VITALS — BP 128/79 | HR 72 | Temp 97.5°F | Ht 62.0 in | Wt 155.4 lb

## 2023-06-17 VITALS — BP 119/68 | HR 87 | Temp 97.8°F | Ht 62.0 in | Wt 156.8 lb

## 2023-06-17 DIAGNOSIS — F411 Generalized anxiety disorder: Secondary | ICD-10-CM | POA: Diagnosis not present

## 2023-06-17 DIAGNOSIS — M170 Bilateral primary osteoarthritis of knee: Secondary | ICD-10-CM

## 2023-06-17 DIAGNOSIS — K219 Gastro-esophageal reflux disease without esophagitis: Secondary | ICD-10-CM | POA: Diagnosis not present

## 2023-06-17 DIAGNOSIS — E039 Hypothyroidism, unspecified: Secondary | ICD-10-CM

## 2023-06-17 DIAGNOSIS — M5430 Sciatica, unspecified side: Secondary | ICD-10-CM

## 2023-06-17 DIAGNOSIS — E1159 Type 2 diabetes mellitus with other circulatory complications: Secondary | ICD-10-CM | POA: Diagnosis not present

## 2023-06-17 DIAGNOSIS — I252 Old myocardial infarction: Secondary | ICD-10-CM

## 2023-06-17 DIAGNOSIS — F331 Major depressive disorder, recurrent, moderate: Secondary | ICD-10-CM

## 2023-06-17 DIAGNOSIS — I152 Hypertension secondary to endocrine disorders: Secondary | ICD-10-CM

## 2023-06-17 DIAGNOSIS — I25110 Atherosclerotic heart disease of native coronary artery with unstable angina pectoris: Secondary | ICD-10-CM

## 2023-06-17 DIAGNOSIS — M1990 Unspecified osteoarthritis, unspecified site: Secondary | ICD-10-CM

## 2023-06-17 DIAGNOSIS — N760 Acute vaginitis: Secondary | ICD-10-CM

## 2023-06-17 DIAGNOSIS — R112 Nausea with vomiting, unspecified: Secondary | ICD-10-CM

## 2023-06-17 DIAGNOSIS — K2100 Gastro-esophageal reflux disease with esophagitis, without bleeding: Secondary | ICD-10-CM

## 2023-06-17 DIAGNOSIS — K5900 Constipation, unspecified: Secondary | ICD-10-CM

## 2023-06-17 DIAGNOSIS — E1169 Type 2 diabetes mellitus with other specified complication: Secondary | ICD-10-CM

## 2023-06-17 DIAGNOSIS — Z1231 Encounter for screening mammogram for malignant neoplasm of breast: Secondary | ICD-10-CM

## 2023-06-17 DIAGNOSIS — M542 Cervicalgia: Secondary | ICD-10-CM | POA: Diagnosis not present

## 2023-06-17 DIAGNOSIS — M549 Dorsalgia, unspecified: Secondary | ICD-10-CM | POA: Diagnosis not present

## 2023-06-17 DIAGNOSIS — Z860100 Personal history of colon polyps, unspecified: Secondary | ICD-10-CM

## 2023-06-17 MED ORDER — FAMOTIDINE 40 MG PO TABS: 40.0000 mg | ORAL_TABLET | Freq: Every day | ORAL | 1 refills | Status: DC

## 2023-06-17 MED ORDER — BACLOFEN 5 MG PO TABS: 5.0000 mg | ORAL_TABLET | Freq: Three times a day (TID) | ORAL | 0 refills | Status: DC | PRN

## 2023-06-17 MED ORDER — RYBELSUS 7 MG PO TABS: 7.0000 mg | ORAL_TABLET | Freq: Every day | ORAL | 2 refills | Status: DC

## 2023-06-17 MED ORDER — LINACLOTIDE 72 MCG PO CAPS: 72.0000 ug | ORAL_CAPSULE | Freq: Every day | ORAL | 3 refills | Status: DC

## 2023-06-17 MED ORDER — ISOSORBIDE MONONITRATE ER 60 MG PO TB24: ORAL_TABLET | ORAL | 1 refills | Status: DC

## 2023-06-17 MED ORDER — TRAMADOL HCL 50 MG PO TABS: 50.0000 mg | ORAL_TABLET | Freq: Three times a day (TID) | ORAL | 2 refills | Status: DC | PRN

## 2023-06-17 MED ORDER — LINACLOTIDE 145 MCG PO CAPS: 145.0000 ug | ORAL_CAPSULE | Freq: Every day | ORAL | 1 refills | Status: DC

## 2023-06-17 MED ORDER — ALPRAZOLAM 0.5 MG PO TABS: 0.5000 mg | ORAL_TABLET | Freq: Every day | ORAL | 2 refills | Status: DC | PRN

## 2023-06-17 MED ORDER — LEVOTHYROXINE SODIUM 50 MCG PO TABS: ORAL_TABLET | ORAL | 3 refills | Status: DC

## 2023-06-17 MED ORDER — PANTOPRAZOLE SODIUM 40 MG PO TBEC: 40.0000 mg | DELAYED_RELEASE_TABLET | Freq: Two times a day (BID) | ORAL | 0 refills | Status: DC

## 2023-06-17 MED ORDER — PRAVASTATIN SODIUM 80 MG PO TABS: 80.0000 mg | ORAL_TABLET | Freq: Every evening | ORAL | 0 refills | Status: DC

## 2023-06-17 MED ORDER — ONDANSETRON HCL 4 MG PO TABS: 4.0000 mg | ORAL_TABLET | Freq: Three times a day (TID) | ORAL | 2 refills | Status: DC | PRN

## 2023-06-17 MED ORDER — METOPROLOL TARTRATE 50 MG PO TABS: 50.0000 mg | ORAL_TABLET | Freq: Two times a day (BID) | ORAL | 0 refills | Status: DC

## 2023-06-17 NOTE — Patient Instructions (Signed)
 For constipation: Mini colon cleanse-  -take Linzess daily for the next few days or until you feel like your bowels are cleaned out well.  -today or tomorrow, take Miralax one capful mixed in 6 ounces of water EVERY HOUR FOR SIX HOURS.  Daily regimen-  -take fiber once daily (try to get 3-4 grams of fiber from a supplement each day).  -take Linzess every day. I have sent in RX.  -drink at least 64 ounces of water daily -Please let me know if this does not control your constipation.  -return office visit in 3 weeks.  For acid reflux: -continue pantoprazole twice daily before breakfast and evening meal -take famotidine 40mg  daily as needed only.

## 2023-06-17 NOTE — Patient Instructions (Signed)

## 2023-06-17 NOTE — Progress Notes (Signed)
 Subjective:    Patient ID: Rhonda Garcia, female    DOB: [birth year], 83 y.o.   MRN: [id number]  Chief Complaint  Patient presents with   Medical Management of Chronic Issues   Constipation   Knee Pain    LEFT KNEE    PT presents to the office today for  chronic follow up. She is followed by Cardiologists annually for A Fib, hx NSTEMI, and CAD. Takes Eliquis.    She is followed by GI as needed for constipation, GERD.   She had elevated rheumatoid arthritis and saw a rheumatologists. She states her pain is a 10 out 10.    Her husband passed away 2023-05-11. Since then states she is unsure if her medications are correct. She has been taking "what medications she has", but is not sure if she is taking correctly. She left her medications at home.  Hypertension This is a chronic problem. The current episode started more than 1 year ago. The problem has been waxing and waning since onset. The problem is uncontrolled. Associated symptoms include anxiety and malaise/fatigue. Pertinent negatives include no blurred vision, peripheral edema or shortness of breath. Risk factors for coronary artery disease include dyslipidemia, diabetes mellitus, obesity and sedentary lifestyle. The current treatment provides moderate improvement. Identifiable causes of hypertension include a thyroid problem.  Gastroesophageal Reflux She complains of belching and heartburn. She reports no hoarse voice. This is a chronic problem. The current episode started more than 1 year ago. The problem occurs occasionally. Associated symptoms include fatigue. She has tried a PPI for the symptoms. The treatment provided moderate relief.  Diabetes She presents for her follow-up diabetic visit. She has type 2 diabetes mellitus. Hypoglycemia symptoms include nervousness/anxiousness. Associated symptoms include fatigue and foot paresthesias. Pertinent negatives for diabetes include no blurred vision. Symptoms are stable. Diabetic  complications include peripheral neuropathy. Risk factors for coronary artery disease include dyslipidemia, diabetes mellitus, hypertension, sedentary lifestyle and post-menopausal. She is following a generally healthy diet. (Does not check glucose)  Thyroid Problem Presents for follow-up visit. Symptoms include anxiety, constipation, depressed mood and fatigue. Patient reports no diarrhea or hoarse voice. The symptoms have been stable.  Hyperlipidemia This is a chronic problem. The current episode started more than 1 year ago. The problem is controlled. Exacerbating diseases include obesity. Pertinent negatives include no shortness of breath. Current antihyperlipidemic treatment includes statins. The current treatment provides moderate improvement of lipids. Risk factors for coronary artery disease include dyslipidemia, diabetes mellitus, hypertension, a sedentary lifestyle and post-menopausal.  Arthritis Presents for follow-up visit. She complains of pain and stiffness. The symptoms have been stable. Affected locations include the left knee and right knee. Her pain is at a severity of 10/10. Associated symptoms include fatigue. Pertinent negatives include no diarrhea.  Anxiety Presents for follow-up visit. Symptoms include depressed mood, excessive worry, insomnia, irritability, nervous/anxious behavior and restlessness. Patient reports no shortness of breath. Symptoms occur occasionally. The severity of symptoms is moderate.    Depression        This is a chronic problem.  The current episode started more than 1 year ago.   The problem occurs intermittently.  Associated symptoms include fatigue, insomnia, restlessness and sad.  Associated symptoms include no helplessness and no hopelessness.  Past medical history includes thyroid problem and anxiety.   Constipation This is a chronic problem. The current episode started more than 1 year ago. The problem has been waxing and waning since onset. Her  stool frequency is 2 to  3 times per week. Pertinent negatives include no diarrhea. She has tried diet changes, stool softeners and fiber for the symptoms. The treatment provided mild relief.      Review of Systems  Constitutional:  Positive for fatigue, irritability and malaise/fatigue.  HENT:  Negative for hoarse voice.   Eyes:  Negative for blurred vision.  Respiratory:  Negative for shortness of breath.   Gastrointestinal:  Positive for constipation and heartburn. Negative for diarrhea.  Musculoskeletal:  Positive for stiffness.  Psychiatric/Behavioral:  The patient is nervous/anxious and has insomnia.   All other systems reviewed and are negative.      Objective:   Physical Exam Vitals reviewed.  Constitutional:      General: She is not in acute distress.    Appearance: She is well-developed. She is obese.  HENT:     Head: Normocephalic and atraumatic.     Right Ear: Tympanic membrane normal.     Left Ear: Tympanic membrane normal.  Eyes:     Pupils: Pupils are equal, round, and reactive to light.  Neck:     Thyroid: No thyromegaly.  Cardiovascular:     Rate and Rhythm: Normal rate and regular rhythm.     Heart sounds: Normal heart sounds. No murmur heard. Pulmonary:     Effort: Pulmonary effort is normal. No respiratory distress.     Breath sounds: Normal breath sounds. No wheezing.  Abdominal:     General: Bowel sounds are normal. There is no distension.     Palpations: Abdomen is soft.     Tenderness: There is no abdominal tenderness.  Musculoskeletal:        General: Tenderness present.     Cervical back: Normal range of motion and neck supple.     Comments: Pain in left knee flexion and extension   Skin:    General: Skin is warm and dry.  Neurological:     Mental Status: She is alert and oriented to person, place, and time.     Cranial Nerves: No cranial nerve deficit.     Deep Tendon Reflexes: Reflexes are normal and symmetric.  Psychiatric:         Behavior: Behavior normal.        Thought Content: Thought content normal.        Judgment: Judgment normal.      BP (!) 148/79   Pulse 72   Temp (!) 97.5 F (36.4 C) (Temporal)   Ht 5\' 2"  (1.575 m)   Wt 155 lb 6.4 oz (70.5 kg)   SpO2 96%   BMI 28.42 kg/m      Assessment & Plan:  Rhonda Garcia comes in today with chief complaint of Medical Management of Chronic Issues, Constipation, and Knee Pain (LEFT KNEE/)   Diagnosis and orders addressed: 1. GAD (generalized anxiety disorder) - ALPRAZolam (XANAX) 0.5 MG tablet; Take 1 tablet (0.5 mg total) by mouth daily as needed.  Dispense: 20 tablet; Refill: 2  2. Osteoarthritis, unspecified osteoarthritis type, unspecified site - Baclofen 5 MG TABS; Take 1 tablet (5 mg total) by mouth 3 (three) times daily as needed.  Dispense: 90 tablet; Refill: 0 - traMADol (ULTRAM) 50 MG tablet; Take 1 tablet (50 mg total) by mouth every 8 (eight) hours as needed.  Dispense: 20 tablet; Refill: 2  3. Back pain with sciatica - Baclofen 5 MG TABS; Take 1 tablet (5 mg total) by mouth 3 (three) times daily as needed.  Dispense: 90 tablet; Refill: 0  4.  Neck pain - Baclofen 5 MG TABS; Take 1 tablet (5 mg total) by mouth 3 (three) times daily as needed.  Dispense: 90 tablet; Refill: 0  5. Gastroesophageal reflux disease with esophagitis without hemorrhage - famotidine (PEPCID) 40 MG tablet; Take 1 tablet (40 mg total) by mouth daily.  Dispense: 90 tablet; Refill: 1  6. Hypothyroidism, unspecified type - levothyroxine (SYNTHROID) 50 MCG tablet; TAKE ONE (1) TABLET EACH DAY  Dispense: 90 tablet; Refill: 3  7. Constipation, unspecified constipation type - linaclotide (LINZESS) 145 MCG CAPS capsule; Take 1 capsule (145 mcg total) by mouth daily before breakfast.  Dispense: 90 capsule; Refill: 1  8. Acute vaginitis  9. Nausea and vomiting, unspecified vomiting type - ondansetron (ZOFRAN) 4 MG tablet; Take 1 tablet (4 mg total) by mouth every 8  (eight) hours as needed.  Dispense: 30 tablet; Refill: 2  10. Gastroesophageal reflux disease without esophagitis - pantoprazole (PROTONIX) 40 MG tablet; Take 1 tablet (40 mg total) by mouth 2 (two) times daily.  Dispense: 180 tablet; Refill: 0  11. Hyperlipidemia associated with type 2 diabetes mellitus (HCC) - pravastatin (PRAVACHOL) 80 MG tablet; Take 1 tablet (80 mg total) by mouth every evening.  Dispense: 90 tablet; Refill: 0  12. Bilateral primary osteoarthritis of knee - traMADol (ULTRAM) 50 MG tablet; Take 1 tablet (50 mg total) by mouth every 8 (eight) hours as needed.  Dispense: 20 tablet; Refill: 2 - AMB referral to orthopedics  13. Hypertension associated with diabetes (HCC) (Primary) - metoprolol tartrate (LOPRESSOR) 50 MG tablet; Take 1 tablet (50 mg total) by mouth 2 (two) times daily.  Dispense: 180 tablet; Refill: 0  14. Moderate episode of recurrent major depressive disorder (HCC)   15. Coronary artery disease involving native coronary artery of native heart with unstable angina pectoris (HCC)  16. Type 2 diabetes mellitus with other specified complication, without long-term current use of insulin (HCC) - Semaglutide (RYBELSUS) 7 MG TABS; Take 1 tablet (7 mg total) by mouth daily.  Dispense: 90 tablet; Refill: 2  17. Hx of non-ST elevation myocardial infarction (NSTEMI)   Since the death of her husband, she is unsure if she has not been taking all of her medications correctly. She will RTO in 1 month and bring ALL medications.  Patient reviewed in [place] controlled database, no flags noted. Contract and drug screen are up to date.  Referral to Ortho Continue current medications  Health Maintenance reviewed Diet and exercise encouraged  Follow up plan: 1 month to follow up on medications   Jannifer Rodney, FNP

## 2023-06-20 ENCOUNTER — Emergency Department (HOSPITAL_COMMUNITY)
Admission: EM | Admit: 2023-06-20 | Discharge: 2023-06-20 | Disposition: A | Discharge status: Home / Self Care | Attending: Emergency Medicine | Admitting: Emergency Medicine

## 2023-06-20 ENCOUNTER — Emergency Department (HOSPITAL_COMMUNITY)

## 2023-06-20 ENCOUNTER — Encounter (HOSPITAL_COMMUNITY): Payer: Self-pay

## 2023-06-20 DIAGNOSIS — I4891 Unspecified atrial fibrillation: Secondary | ICD-10-CM | POA: Insufficient documentation

## 2023-06-20 DIAGNOSIS — E039 Hypothyroidism, unspecified: Secondary | ICD-10-CM | POA: Diagnosis not present

## 2023-06-20 DIAGNOSIS — I959 Hypotension, unspecified: Secondary | ICD-10-CM | POA: Diagnosis not present

## 2023-06-20 DIAGNOSIS — R109 Unspecified abdominal pain: Secondary | ICD-10-CM | POA: Diagnosis not present

## 2023-06-20 DIAGNOSIS — R1084 Generalized abdominal pain: Secondary | ICD-10-CM | POA: Diagnosis not present

## 2023-06-20 DIAGNOSIS — K429 Umbilical hernia without obstruction or gangrene: Secondary | ICD-10-CM | POA: Diagnosis not present

## 2023-06-20 DIAGNOSIS — R630 Anorexia: Secondary | ICD-10-CM | POA: Insufficient documentation

## 2023-06-20 DIAGNOSIS — R0989 Other specified symptoms and signs involving the circulatory and respiratory systems: Secondary | ICD-10-CM | POA: Diagnosis not present

## 2023-06-20 DIAGNOSIS — K5730 Diverticulosis of large intestine without perforation or abscess without bleeding: Secondary | ICD-10-CM | POA: Diagnosis not present

## 2023-06-20 DIAGNOSIS — I2510 Atherosclerotic heart disease of native coronary artery without angina pectoris: Secondary | ICD-10-CM | POA: Insufficient documentation

## 2023-06-20 DIAGNOSIS — K5900 Constipation, unspecified: Secondary | ICD-10-CM | POA: Diagnosis not present

## 2023-06-20 DIAGNOSIS — E119 Type 2 diabetes mellitus without complications: Secondary | ICD-10-CM | POA: Diagnosis not present

## 2023-06-20 DIAGNOSIS — Z79899 Other long term (current) drug therapy: Secondary | ICD-10-CM | POA: Diagnosis not present

## 2023-06-20 DIAGNOSIS — I10 Essential (primary) hypertension: Secondary | ICD-10-CM | POA: Insufficient documentation

## 2023-06-20 DIAGNOSIS — I700 Atherosclerosis of aorta: Secondary | ICD-10-CM | POA: Diagnosis not present

## 2023-06-20 DIAGNOSIS — R079 Chest pain, unspecified: Secondary | ICD-10-CM | POA: Diagnosis not present

## 2023-06-20 DIAGNOSIS — R1013 Epigastric pain: Secondary | ICD-10-CM | POA: Insufficient documentation

## 2023-06-20 DIAGNOSIS — R1010 Upper abdominal pain, unspecified: Secondary | ICD-10-CM | POA: Diagnosis not present

## 2023-06-20 LAB — URINALYSIS, ROUTINE W REFLEX MICROSCOPIC
Bacteria, UA: NONE SEEN
Bilirubin Urine: NEGATIVE
Glucose, UA: NEGATIVE mg/dL
Hgb urine dipstick: NEGATIVE
Ketones, ur: NEGATIVE mg/dL
Nitrite: NEGATIVE
Protein, ur: NEGATIVE mg/dL
Specific Gravity, Urine: >1.046 — ABNORMAL HIGH (ref 1.005–1.030)
pH: 6 (ref 5.0–8.0)

## 2023-06-20 LAB — COMPREHENSIVE METABOLIC PANEL
ALT: 17 U/L (ref 0–44)
AST: 20 U/L (ref 15–41)
Albumin: 3.6 g/dL (ref 3.5–5.0)
Alkaline Phosphatase: 61 U/L (ref 38–126)
Anion gap: 9 (ref 5–15)
BUN: 11 mg/dL (ref 8–23)
CO2: 25 mmol/L (ref 22–32)
Calcium: 9.1 mg/dL (ref 8.9–10.3)
Chloride: 106 mmol/L (ref 98–111)
Creatinine, Ser: 0.79 mg/dL (ref 0.44–1.00)
GFR, Estimated: >60 mL/min (ref >60)
Glucose, Bld: 185 mg/dL — ABNORMAL HIGH (ref 70–99)
Potassium: 3.4 mmol/L — ABNORMAL LOW (ref 3.5–5.1)
Sodium: 140 mmol/L (ref 135–145)
Total Bilirubin: 0.8 mg/dL (ref 0.0–1.2)
Total Protein: 6 g/dL — ABNORMAL LOW (ref 6.5–8.1)

## 2023-06-20 LAB — TROPONIN I (HIGH SENSITIVITY)
Troponin I (High Sensitivity): 6 ng/L (ref <18)
Troponin I (High Sensitivity): 6 ng/L (ref <18)

## 2023-06-20 LAB — CBC WITH DIFFERENTIAL/PLATELET
Abs Immature Granulocytes: 0.01 10*3/uL (ref 0.00–0.07)
Basophils Absolute: 0 10*3/uL (ref 0.0–0.1)
Basophils Relative: 1 %
Eosinophils Absolute: 0.1 10*3/uL (ref 0.0–0.5)
Eosinophils Relative: 2 %
HCT: 35.6 % — ABNORMAL LOW (ref 36.0–46.0)
Hemoglobin: 11.5 g/dL — ABNORMAL LOW (ref 12.0–15.0)
Immature Granulocytes: 0 %
Lymphocytes Relative: 22 %
Lymphs Abs: 1.1 10*3/uL (ref 0.7–4.0)
MCH: 31 pg (ref 26.0–34.0)
MCHC: 32.3 g/dL (ref 30.0–36.0)
MCV: 96 fL (ref 80.0–100.0)
Monocytes Absolute: 0.6 10*3/uL (ref 0.1–1.0)
Monocytes Relative: 11 %
Neutro Abs: 3.2 10*3/uL (ref 1.7–7.7)
Neutrophils Relative %: 64 %
Platelets: 111 10*3/uL — ABNORMAL LOW (ref 150–400)
RBC: 3.71 MIL/uL — ABNORMAL LOW (ref 3.87–5.11)
RDW: 14.5 % (ref 11.5–15.5)
WBC: 5 10*3/uL (ref 4.0–10.5)
nRBC: 0 % (ref 0.0–0.2)

## 2023-06-20 LAB — LIPASE, BLOOD: Lipase: 28 U/L (ref 11–51)

## 2023-06-20 MED ORDER — IOHEXOL 300 MG/ML  SOLN
100.0000 mL | Freq: Once | INTRAMUSCULAR | Status: AC | PRN
  Administered 2023-06-20: 100 mL via INTRAVENOUS

## 2023-06-20 MED ORDER — ONDANSETRON HCL 4 MG/2ML IJ SOLN
4.0000 mg | Freq: Once | INTRAMUSCULAR | Status: AC
  Administered 2023-06-20: 4 mg via INTRAVENOUS
  Filled 2023-06-20: qty 2

## 2023-06-20 MED ORDER — MORPHINE SULFATE (PF) 4 MG/ML IV SOLN
4.0000 mg | Freq: Once | INTRAVENOUS | Status: AC
  Administered 2023-06-20: 4 mg via INTRAVENOUS
  Filled 2023-06-20: qty 1

## 2023-06-20 NOTE — ED Provider Notes (Signed)
 [place] EMERGENCY DEPARTMENT AT [facility] Provider Note   CSN: [id number] Arrival date & time: 06/20/23  0038     History  Chief Complaint  Patient presents with   Abdominal Pain    Rhonda Garcia is a 83 y.o. female.  HPI     This is an 83 year old female who presents with multiple complaints.  History of A-fib but no longer on anticoagulation.  She reports that her husband died approximately 1 month ago and since that time she has not felt well.  She has seen her primary doctor twice.  She reports constipation that has been persistent although she has been taking "a lot of laxatives."  She did start having bowel movements several days ago but states they are low-volume.  She has had nausea and vomiting.  She reports generalized anorexia and food aversion.  She states that tonight she had episode of palpitations.  She also has developed upper abdominal pain that radiates to the bilateral flanks.  No fevers.  No cough.  No known sick contacts.  Home Medications Prior to Admission medications   Medication Sig Start Date End Date Taking? Authorizing Provider  acetaminophen (TYLENOL) 650 MG CR tablet Take 650 mg by mouth every 8 (eight) hours as needed for pain.    [provider]  ALPRAZolam Prudy Feeler) 0.5 MG tablet Take 1 tablet (0.5 mg total) by mouth daily as needed. Patient not taking: Reported on 06/17/2023 06/17/23   Jannifer Rodney A, FNP  Baclofen 5 MG TABS Take 1 tablet (5 mg total) by mouth 3 (three) times daily as needed. 06/17/23   Junie Spencer, FNP  famotidine (PEPCID) 40 MG tablet Take 1 tablet (40 mg total) by mouth daily. Patient not taking: Reported on 06/17/2023 06/17/23   Jannifer Rodney A, FNP  hydroxychloroquine (PLAQUENIL) 200 MG tablet TAKE ONE (1) TABLET BY MOUTH EVERY DAY Patient taking differently: Take 200 mg by mouth daily as needed (pain). 12/12/20   Fuller Plan, MD  isosorbide mononitrate (IMDUR) 60 MG 24 hr tablet Take 60 mg  by mouth daily.    [provider]  levothyroxine (SYNTHROID) 50 MCG tablet TAKE ONE (1) TABLET EACH DAY 06/17/23   Junie Spencer, FNP  linaclotide [facility]) 72 MCG capsule Take 1 capsule (72 mcg total) by mouth daily before breakfast. 06/17/23   Tiffany Kocher, PA-C  meclizine (ANTIVERT) 12.5 MG tablet Take 12.5 mg by mouth 3 (three) times daily as needed for dizziness.    [provider]  metoprolol tartrate (LOPRESSOR) 50 MG tablet Take 1 tablet (50 mg total) by mouth 2 (two) times daily. 06/17/23   Jannifer Rodney A, FNP  nitroGLYCERIN (NITROSTAT) 0.4 MG SL tablet DISSOLVE 1 TAB UNDER TOUNGE FOR CHEST PAIN. MAY REPEAT EVERY 5 MINUTES FOR 3 DOSES. IF NO RELIEF CALL 911 OR GO TO ER 11/07/21   Jannifer Rodney A, FNP  ondansetron (ZOFRAN) 4 MG tablet Take 1 tablet (4 mg total) by mouth every 8 (eight) hours as needed. Patient not taking: Reported on 06/17/2023 06/17/23   Jannifer Rodney A, FNP  pantoprazole (PROTONIX) 40 MG tablet Take 1 tablet (40 mg total) by mouth 2 (two) times daily. Patient not taking: Reported on 06/17/2023 06/17/23   Junie Spencer, FNP  pravastatin (PRAVACHOL) 80 MG tablet Take 1 tablet (80 mg total) by mouth every evening. 06/17/23   Hawks, Christy A, FNP  Semaglutide (RYBELSUS) 7 MG TABS Take 1 tablet (7 mg total) by mouth  daily. 06/17/23   Junie Spencer, FNP  tiZANidine (ZANAFLEX) 2 MG tablet Take by mouth every 6 (six) hours as needed for muscle spasms.    [provider]  traMADol (ULTRAM) 50 MG tablet Take 1 tablet (50 mg total) by mouth every 8 (eight) hours as needed. 06/17/23   Junie Spencer, FNP      Allergies    Fentanyl, Promethazine hcl, Jardiance [empagliflozin], Metformin and related, and Farxiga [dapagliflozin]    Review of Systems   Review of Systems  Constitutional:  Negative for fever.  Respiratory:  Negative for shortness of breath.   Cardiovascular:  Negative for chest pain.  Gastrointestinal:  Positive for abdominal pain,  constipation, nausea and vomiting.  All other systems reviewed and are negative.   Physical Exam Updated Vital Signs BP 134/71   Pulse 76   Temp 97.8 F (36.6 C)   Resp 14   SpO2 90%  Physical Exam Vitals and nursing note reviewed.  Constitutional:      Appearance: She is well-developed. She is not ill-appearing.  HENT:     Head: Normocephalic and atraumatic.  Eyes:     Pupils: Pupils are equal, round, and reactive to light.  Cardiovascular:     Rate and Rhythm: Normal rate and regular rhythm.     Heart sounds: Normal heart sounds.  Pulmonary:     Effort: Pulmonary effort is normal. No respiratory distress.     Breath sounds: No wheezing.  Abdominal:     General: Bowel sounds are normal.     Palpations: Abdomen is soft.     Tenderness: There is abdominal tenderness in the epigastric area. There is no guarding or rebound.  Musculoskeletal:     Cervical back: Neck supple.  Skin:    General: Skin is warm and dry.  Neurological:     Mental Status: She is alert and oriented to person, place, and time.  Psychiatric:        Mood and Affect: Mood normal.     ED Results / Procedures / Treatments   Labs (all labs ordered are listed, but only abnormal results are displayed) Labs Reviewed  CBC WITH DIFFERENTIAL/PLATELET - Abnormal; Notable for the following components:      Result Value   RBC 3.71 (*)    Hemoglobin 11.5 (*)    HCT 35.6 (*)    Platelets 111 (*)    All other components within normal limits  COMPREHENSIVE METABOLIC PANEL - Abnormal; Notable for the following components:   Potassium 3.4 (*)    Glucose, Bld 185 (*)    Total Protein 6.0 (*)    All other components within normal limits  URINALYSIS, ROUTINE W REFLEX MICROSCOPIC - Abnormal; Notable for the following components:   Specific Gravity, Urine >1.046 (*)    Leukocytes,Ua MODERATE (*)    All other components within normal limits  LIPASE, BLOOD  TROPONIN I (HIGH SENSITIVITY)  TROPONIN I (HIGH  SENSITIVITY)    EKG EKG Interpretation Date/Time:  Thursday June 20 2023 00:44:55 EDT Ventricular Rate:  85 PR Interval:    QRS Duration:  80 QT Interval:  336 QTC Calculation: 400 R Axis:   78  Text Interpretation: Atrial fibrillation Ventricular premature complex Borderline T abnormalities, inferior leads Confirmed by Ross Marcus ([id number]) on 06/20/2023 4:03:34 AM  Radiology CT ABDOMEN PELVIS W CONTRAST Result Date: 06/20/2023 CLINICAL DATA:  Abdominal pain, acute, nonlocalized Pt c/o upper abdominal pain with possible GERD. Hx of this. Started 2.5 hours  ago. Endorses nausea. EXAM: CT ABDOMEN AND PELVIS WITH CONTRAST TECHNIQUE: Multidetector CT imaging of the abdomen and pelvis was performed using the standard protocol following bolus administration of intravenous contrast. RADIATION DOSE REDUCTION: This exam was performed according to the departmental dose-optimization program which includes automated exposure control, adjustment of the mA and/or kV according to patient size and/or use of iterative reconstruction technique. CONTRAST:  OMNIPAQUE IOHEXOL 300 MG/ML  SOLN COMPARISON:  CT abdomen pelvis 09/21/2010 FINDINGS: Lower chest: Atherosclerotic plaque. Coronary artery calcification. Mitral annular calcification. Aortic valve leaflet calcification. Cardiomegaly. Hepatobiliary: No focal liver abnormality. Status post cholecystectomy. No biliary dilatation. Pancreas: Diffusely atrophic. No focal lesion. Otherwise normal pancreatic contour. No surrounding inflammatory changes. No main pancreatic ductal dilatation. Spleen: Normal in size without focal abnormality. Adrenals/Urinary Tract: No adrenal nodule bilaterally. Bilateral kidneys enhance symmetrically. No hydronephrosis. No hydroureter. The urinary bladder is unremarkable. On delayed imaging, there is no urothelial wall thickening and there are no filling defects in the opacified portions of the bilateral collecting systems or  ureters. Stomach/Bowel: Stomach is within normal limits. No evidence of bowel wall thickening or dilatation. Colonic diverticulosis. The appendix is not definitely identified with no inflammatory changes in the right lower quadrant to suggest acute appendicitis. Vascular/Lymphatic: No abdominal aorta or iliac aneurysm. Mild atherosclerotic plaque of the aorta and its branches. No abdominal, pelvic, or inguinal lymphadenopathy. Reproductive: Status post hysterectomy. No adnexal masses. Other: No intraperitoneal free fluid. No intraperitoneal free gas. No organized fluid collection. Musculoskeletal: Small fat containing umbilical hernia with an abdominal defect of 0.9 cm. No suspicious lytic or blastic osseous lesions. No acute displaced fracture. Multilevel degenerative changes of the spine. IMPRESSION: 1. Cardiomegaly. 2. Small fat containing umbilical hernia with an abdominal defect of 0.9 cm. 3. Colonic diverticulosis with no acute diverticulitis. 4. Aortic Atherosclerosis (ICD10-I70.0) including coronary artery, mitral annular, aortic left leaflet calcifications-correlate for aortic stenosis. 5. Status post cholecystectomy and hysterectomy. Electronically Signed   By: Tish Frederickson M.D.   On: 06/20/2023 03:54   DG Chest Portable 1 View Result Date: 06/20/2023 CLINICAL DATA:  upper abdominal/chest pain EXAM: PORTABLE CHEST 1 VIEW COMPARISON:  Chest x-ray 08/24/2005, chest x-ray 09/04/2018 FINDINGS: The heart and mediastinal contours are unchanged. Atherosclerotic plaque. Low lung volumes. No focal consolidation. Chronic coarsened interstitial markings with no overt pulmonary edema. No pleural effusion. No pneumothorax. No acute osseous abnormality. IMPRESSION: 1. Low lung volumes with no active disease. 2.  Aortic Atherosclerosis (ICD10-I70.0). Electronically Signed   By: Tish Frederickson M.D.   On: 06/20/2023 02:11    Procedures Procedures    Medications Ordered in ED Medications  morphine (PF) 4  MG/ML injection 4 mg (4 mg Intravenous Given 06/20/23 0115)  ondansetron (ZOFRAN) injection 4 mg (4 mg Intravenous Given 06/20/23 0115)  iohexol (OMNIPAQUE) 300 MG/ML solution 100 mL (100 mLs Intravenous Contrast Given 06/20/23 0231)    ED Course/ Medical Decision Making/ A&P                                 Medical Decision Making Amount and/or Complexity of Data Reviewed Labs: ordered. Radiology: ordered.  Risk Prescription drug management.   This patient presents to the ED for concern of abdominal pain, constipation, vomiting, this involves an extensive number of treatment options, and is a complaint that carries with it a high risk of complications and morbidity.  I considered the following differential and admission for this acute, potentially  life threatening condition.  The differential diagnosis includes gastritis, gastroenteritis, atypical ACS, obstructive pathology, colitis, known IBS  MDM:    This is an 83 year old female who presents with abdominal pain.  Also reports constipation which improved somewhat over the last several days.  She is also had some nausea and vomiting.  Recent loss of her husband.  She is nontoxic and vital signs are reassuring.  Abdominal exam is fairly benign without signs of peritonitis.  Reviewed gastroenterology notes.  Last visit was 5/10.  Bowel regimen was changed at that time and she was continued on her Protonix for gastritis.  EKG shows no evidence of acute ischemia.  History of persistent atrial fibrillation.  Rate controlled.  Labs are largely unremarkable including CBC, CMP, urinalysis, lipase, troponin x 2.  CT does not show any evidence of abdominal pathology such as obstruction or significant constipation.  She does have multiple incidental findings.  Patient was able to tolerate fluids without difficulty.  On recheck she is clinically stable without significant pain.  Recommend that she follow back up with gastroenterology.  (Labs, imaging,  consults)  Labs: I Ordered, and personally interpreted labs.  The pertinent results include: CBC, CMP, lipase, urinalysis, troponin x 2  Imaging Studies ordered: I ordered imaging studies including chest x-ray, CT abdomen I independently visualized and interpreted imaging. I agree with the radiologist interpretation  Additional history obtained from chart review.  External records from outside source obtained and reviewed including gastroenterology notes  Cardiac Monitoring: The patient was maintained on a cardiac monitor.  If on the cardiac monitor, I personally viewed and interpreted the cardiac monitored which showed an underlying rhythm of: Atrial fibrillation  Reevaluation: After the interventions noted above, I reevaluated the patient and found that they have :improved  Social Determinants of Health:  lives independently  Disposition: Discharge  Co morbidities that complicate the patient evaluation  Past Medical History:  Diagnosis Date   A-fib (HCC)    Anal fissure    resolved    Anemia    Back pain    with left radiculopathy   CAD (coronary artery disease)    Cataract    Collagen vascular disease (HCC)    DDD (degenerative disc disease)    Depression    Diabetes mellitus    Diverticulosis    Dyslipidemia    Dysrhythmia    AFib   Esophagitis    GERD (gastroesophageal reflux disease)    Hiatal hernia    Hx of peptic ulcer    Hyperlipidemia    Hypertension    Hypothyroidism    Internal hemorrhoids 2017   NSTEMI (non-ST elevated myocardial infarction) (HCC) 08/05/2015   Osteopenia    Sleep apnea    not using CPAP now, could not tolerate and PCP aware.   Thyroid disease      Medicines Meds ordered this encounter  Medications   morphine (PF) 4 MG/ML injection 4 mg   ondansetron (ZOFRAN) injection 4 mg   iohexol (OMNIPAQUE) 300 MG/ML solution 100 mL    I have reviewed the patients home medicines and have made adjustments as needed  Problem List / ED  Course: Problem List Items Addressed This Visit   None Visit Diagnoses       Epigastric pain    -  Primary                   Final Clinical Impression(s) / ED Diagnoses Final diagnoses:  Epigastric pain    Rx /  DC Orders ED Discharge Orders     None         Samari Bittinger, Mayer Masker, MD 06/20/23 [phone]

## 2023-06-20 NOTE — Discharge Instructions (Addendum)
 You were seen today for abdominal pain.  Your workup today is reassuring.  This may be related to your known IBS.  Follow-up with gastroenterology.

## 2023-06-20 NOTE — ED Triage Notes (Signed)
 Pt c/o upper abdominal pain with possible GERD. Hx of this. Started 2.5 hours ago. Endorses nausea.

## 2023-06-26 NOTE — Telephone Encounter (Unsigned)
 Copied from CRM [id number]. Topic: Clinical - Medication Question >> Jun 26, 2023  3:08 PM Shelah Lewandowsky wrote: Reason for CRM: Semaglutide (RYBELSUS) 7 MG TABS- Morrie Sheldon with [facility] looking for clinical information - call dropped before call completed, did not get a return phone number before Iost the call

## 2023-06-28 ENCOUNTER — Encounter: Payer: Self-pay | Admitting: Orthopedic Surgery

## 2023-06-28 ENCOUNTER — Ambulatory Visit: Admitting: Orthopedic Surgery

## 2023-06-28 VITALS — BP 137/78 | HR 84 | Ht 62.0 in | Wt 153.0 lb

## 2023-06-28 DIAGNOSIS — M1712 Unilateral primary osteoarthritis, left knee: Secondary | ICD-10-CM | POA: Diagnosis not present

## 2023-06-28 NOTE — Progress Notes (Signed)
 Pharmacy Medication Assistance Program Note    06/28/2023  Patient ID: Rhonda Garcia, female   DOB: 1940/10/10, 83 y.o.   MRN: [id number]     04/26/2023  Outreach Medication One  Manufacturer Medication One [employer] [facility] Rybelsus  Dose of Rybelsus 7MG   Type of Radiographer, therapeutic Assistance  Date Application Submitted to Manufacturer 04/26/2023  Method Application Sent to Manufacturer Online  Patient Assistance Determination Approved  Approval Start Date 06/28/2023  Approval End Date 04/08/2024     Renewal approved - should arrive to office in 10-14 business days. Expect delays.

## 2023-06-28 NOTE — Progress Notes (Signed)
 New Patient Visit  Assessment: Rhonda Garcia is a 83 y.o. female with the following: 1. Arthritis of left knee   Plan: Rhonda Garcia has advanced degenerative changes in the left knee.  She is currently complaining of pain, primarily in the medial aspect of the left knee.  She has had steroid injections in the past.  She is interested in hyaluronic acid injections.  These were discussed in clinic today.  We will seek authorization.  Once we have confirmed authorization, we will schedule a follow-up appointment for hyaluronic acid injections.  Follow-up: Return for After Insurance Authorization for Injection.  Subjective:  Chief Complaint  Patient presents with   Knee Pain    Bilat L > R for months. Seen by Dr. Ophelia Charter and has had a couple of injections in the past.    History of Present Illness: Rhonda Garcia is a 84 y.o. female who presents for evaluation of left knee pain.  She also has some pain in the right knee.  The right knee is not bothering her that much now.  Pain in the left knee is getting worse.  Keeps her up at night.  Pain is described as sharp and severe.  Prior steroid injections have helped, but she is now interested in HA injections.  She is adamant that she is not interested in total knee arthroplasty.   Review of Systems: No fevers or chills No numbness or tingling No chest pain No shortness of breath No bowel or bladder dysfunction No GI distress No headaches   Medical History:  Past Medical History:  Diagnosis Date   A-fib (HCC)    Anal fissure    resolved    Anemia    Back pain    with left radiculopathy   CAD (coronary artery disease)    Cataract    Collagen vascular disease (HCC)    DDD (degenerative disc disease)    Depression    Diabetes mellitus    Diverticulosis    Dyslipidemia    Dysrhythmia    AFib   Esophagitis    GERD (gastroesophageal reflux disease)    Hiatal hernia    Hx of peptic ulcer    Hyperlipidemia     Hypertension    Hypothyroidism    Internal hemorrhoids 2017   NSTEMI (non-ST elevated myocardial infarction) (HCC) 08/05/2015   Osteopenia    Sleep apnea    not using CPAP now, could not tolerate and PCP aware.   Thyroid disease     Past Surgical History:  Procedure Laterality Date   ABDOMINAL HYSTERECTOMY Bilateral 2001 - approximate   APPENDECTOMY     BREAST REDUCTION SURGERY     CARDIAC CATHETERIZATION N/A 08/08/2015   Procedure: Left Heart Cath and Coronary Angiography;  Surgeon: Kathleene Hazel, MD;  Location: [facility] INVASIVE CV LAB;  Service: Cardiovascular;  Laterality: N/A;   CATARACT EXTRACTION Bilateral    CHOLECYSTECTOMY  2008 - approximate   COLONOSCOPY  06/2007   Friable anal canal and anal papillae. Pancolonic diverticula. Normal terminal ileum. Status post segmental biopsy, descending colon biopsy showed minimal cryptitis. Biopsies felt to be  nonspecific but could be seen with NSAIDs. No features of inflammatory bowel disease. Stool studies were negative.   COLONOSCOPY  03/21/2011   RMR: colonic polyps treated as described above, colonic diverticulosis (Tubular adenomas)   COLONOSCOPY WITH PROPOFOL N/A 06/17/2014   RMR: Colonic diverticulosis. Multiple colonic polyps removed as described above.    COLONOSCOPY WITH PROPOFOL N/A 03/19/2016  Procedure: COLONOSCOPY WITH PROPOFOL;  Surgeon: Corbin Ade, MD;  Location: [facility];  Service: Endoscopy;  Laterality: N/A;  8:45am   COLONOSCOPY WITH PROPOFOL N/A 10/02/2018   Procedure: COLONOSCOPY WITH PROPOFOL;  Surgeon: Corbin Ade, MD;  Location: [facility];  Service: Endoscopy;  Laterality: N/A;  9:15am   CORONARY ANGIOPLASTY WITH STENT PLACEMENT     4 stents   ESOPHAGEAL DILATION N/A 06/17/2014   Procedure: ESOPHAGEAL DILATION;  Surgeon: Corbin Ade, MD;  Location: [facility];  Service: Endoscopy;  Laterality: N/AElease Hashimoto 54 Fr   ESOPHAGOGASTRODUODENOSCOPY  06/2007   Small hiatal hernia    ESOPHAGOGASTRODUODENOSCOPY  03/21/2011   RMR: normal esophagus- status post passage of Maloney dilator. Small hiatal hernia. Trivial antral and bulbar erosions   ESOPHAGOGASTRODUODENOSCOPY (EGD) WITH PROPOFOL N/A 06/17/2014   RMR: Small hiatal hernia; otherwise normal EGD. Status post passage of a Maloney dilator. No explanation for patients right upper quadrant abdominal pain.    ESOPHAGOGASTRODUODENOSCOPY (EGD) WITH PROPOFOL N/A 03/19/2016   Procedure: ESOPHAGOGASTRODUODENOSCOPY (EGD) WITH PROPOFOL;  Surgeon: Corbin Ade, MD;  [facility];  Service: Endoscopy;  Laterality: N/A;   ESOPHAGOGASTRODUODENOSCOPY (EGD) WITH PROPOFOL N/A 10/12/2022   Procedure: ESOPHAGOGASTRODUODENOSCOPY (EGD) WITH PROPOFOL;  Surgeon: Corbin Ade, MD;  Location: [facility];  Service: Endoscopy;  Laterality: N/A;   FLEXIBLE SIGMOIDOSCOPY N/A 10/12/2022   Procedure: FLEXIBLE SIGMOIDOSCOPY;  Surgeon: Corbin Ade, MD;  Location: [facility];  Service: Endoscopy;  Laterality: N/A;   KNEE SURGERY Left    arthroscopy   LIPOMA EXCISION  03/17/2012   Procedure: EXCISION LIPOMA;  Surgeon: Cherylynn Ridges, MD;  Location: [facility];  Service: General;  Laterality: Right;  excision of right lower back lipoma   MALONEY DILATION N/A 03/19/2016   Procedure: Elease Hashimoto DILATION;  Surgeon: Corbin Ade, MD;  Location: [facility];  Service: Endoscopy;  Laterality: N/A;   MALONEY DILATION N/A 10/12/2022   Procedure: Elease Hashimoto DILATION;  Surgeon: Corbin Ade, MD;  Location: [facility];  Service: Endoscopy;  Laterality: N/A;   PATELLA FRACTURE SURGERY Right    POLYPECTOMY  06/17/2014   Procedure: POLYPECTOMY;  Surgeon: Corbin Ade, MD;  Location: [facility];  Service: Endoscopy;;   POLYPECTOMY  10/02/2018   Procedure: POLYPECTOMY;  Surgeon: Corbin Ade, MD;  Location: [facility];  Service: Endoscopy;;  colon   REDUCTION MAMMAPLASTY Bilateral     Family History  Problem Relation Age of  Onset   Heart attack Mother    Hypertension Mother    Stroke Mother        Deceased, age 55   Diabetes Sister    Arthritis Sister    Diabetes Sister    Cancer Sister        ovarian   Stroke Sister    Alzheimer's disease Sister    Diabetes Brother        also has a blood disorder    Clotting disorder Brother    Kidney disease Brother    Heart disease Brother    Melanoma Brother        deceased   Diabetes Son    Hypertension Son    Coronary artery disease Son 21       CABG   Stroke Son    Hypertension Son    Coronary artery disease Son 28       CABG   Colon cancer Neg Hx  Breast cancer Neg Hx    Social History   Tobacco Use   Smoking status: Never   Smokeless tobacco: Never  Vaping Use   Vaping status: Never Used  Substance Use Topics   Alcohol use: Not Currently    Alcohol/week: 1.0 standard drink of alcohol    Types: 1 Glasses of wine per week   Drug use: No    Allergies  Allergen Reactions   Fentanyl Anaphylaxis   Promethazine Hcl Anaphylaxis   Jardiance [Empagliflozin]     Vaginal burning/raw   Metformin And Related     Leg pain   Farxiga [Dapagliflozin] Other (See Comments)    Recurrent yeast infections    No outpatient medications have been marked as taking for the 06/28/23 encounter (Office Visit) with Oliver Barre, MD.    Objective: BP 137/78   Pulse 84   Ht 5\' 2"  (1.575 m)   Wt 153 lb (69.4 kg)   BMI 27.98 kg/m   Physical Exam:  General: Elderly female., Alert and oriented., and No acute distress. Gait: Left sided antalgic gait.  Evaluation left knee demonstrates a moderate effusion.  Varus alignment overall.  She has a Baker's cyst and tenderness in the posterior aspect of the knee.  5-10 degrees short of full extension.  She tolerates flexion to 100 degrees.  No increased laxity varus or valgus stress.  IMAGING:  Previous x-rays of the left knee were available in clinic today.  Varus alignment of the left knee.  Near complete  loss of joint space within the medial compartment.  There are some associated osteophytes.  Ischial x-rays were also available.  Minimal degenerative changes of bilateral hips.  Diffuse degenerative changes within the lumbar spine.  Moderate degenerative change of the right knee.   New Medications:  No orders of the defined types were placed in this encounter.     Oliver Barre, MD  06/28/2023 11:19 AM

## 2023-07-18 ENCOUNTER — Encounter: Payer: Self-pay | Admitting: Family

## 2023-07-18 ENCOUNTER — Ambulatory Visit (INDEPENDENT_AMBULATORY_CARE_PROVIDER_SITE_OTHER): Admitting: Family

## 2023-07-18 VITALS — BP 130/80 | HR 84 | Temp 97.3°F | Ht 62.0 in | Wt 154.0 lb

## 2023-07-18 DIAGNOSIS — E1169 Type 2 diabetes mellitus with other specified complication: Secondary | ICD-10-CM

## 2023-07-18 DIAGNOSIS — F411 Generalized anxiety disorder: Secondary | ICD-10-CM

## 2023-07-18 DIAGNOSIS — K5900 Constipation, unspecified: Secondary | ICD-10-CM | POA: Diagnosis not present

## 2023-07-18 DIAGNOSIS — M5430 Sciatica, unspecified side: Secondary | ICD-10-CM

## 2023-07-18 DIAGNOSIS — F331 Major depressive disorder, recurrent, moderate: Secondary | ICD-10-CM

## 2023-07-18 DIAGNOSIS — Z0001 Encounter for general adult medical examination with abnormal findings: Secondary | ICD-10-CM | POA: Diagnosis not present

## 2023-07-18 DIAGNOSIS — E1159 Type 2 diabetes mellitus with other circulatory complications: Secondary | ICD-10-CM

## 2023-07-18 DIAGNOSIS — I152 Hypertension secondary to endocrine disorders: Secondary | ICD-10-CM

## 2023-07-18 DIAGNOSIS — M1990 Unspecified osteoarthritis, unspecified site: Secondary | ICD-10-CM

## 2023-07-18 DIAGNOSIS — E785 Hyperlipidemia, unspecified: Secondary | ICD-10-CM | POA: Diagnosis not present

## 2023-07-18 DIAGNOSIS — I252 Old myocardial infarction: Secondary | ICD-10-CM

## 2023-07-18 DIAGNOSIS — E039 Hypothyroidism, unspecified: Secondary | ICD-10-CM

## 2023-07-18 DIAGNOSIS — I480 Paroxysmal atrial fibrillation: Secondary | ICD-10-CM

## 2023-07-18 DIAGNOSIS — Z0000 Encounter for general adult medical examination without abnormal findings: Secondary | ICD-10-CM

## 2023-07-18 DIAGNOSIS — K219 Gastro-esophageal reflux disease without esophagitis: Secondary | ICD-10-CM

## 2023-07-18 LAB — BAYER DCA HB A1C WAIVED: HB A1C (BAYER DCA - WAIVED): 6.6 % — ABNORMAL HIGH (ref 4.8–5.6)

## 2023-07-18 MED ORDER — RYBELSUS 7 MG PO TABS: 7.0000 mg | ORAL_TABLET | Freq: Every day | ORAL | 2 refills | Status: DC

## 2023-07-18 MED ORDER — LINACLOTIDE 145 MCG PO CAPS
145.0000 ug | ORAL_CAPSULE | Freq: Every day | ORAL | 1 refills | Status: DC
Start: 2023-07-18 — End: 2023-08-01

## 2023-07-18 NOTE — Progress Notes (Signed)
 Subjective:    Patient ID: Rhonda Garcia, female    DOB: 07-Jan-1941, 83 y.o.   MRN: [id number]  Chief Complaint  Patient presents with   Follow-up    Follow up plan: 1 month to follow up on medications     PT presents to the office today for CPE and  chronic follow up. She is followed by Cardiologists annually for A Fib, hx NSTEMI, and CAD. Takes Eliquis.    She is followed by GI as needed for constipation, GERD.   She had elevated rheumatoid arthritis and saw a rheumatologists. She states her pain is a 9 out 10.    Her husband passed away April 26, 2023. Since then states she is unsure if her medications are correct. She brought in all of her medications today to review.  Hypertension This is a chronic problem. The current episode started more than 1 year ago. The problem has been resolved since onset. The problem is controlled. Associated symptoms include anxiety, malaise/fatigue and peripheral edema. Pertinent negatives include no blurred vision or shortness of breath. Risk factors for coronary artery disease include dyslipidemia, diabetes mellitus, obesity and sedentary lifestyle. The current treatment provides moderate improvement. Identifiable causes of hypertension include a thyroid problem.  Gastroesophageal Reflux She complains of belching and heartburn. She reports no hoarse voice. This is a chronic problem. The current episode started more than 1 year ago. The problem occurs occasionally. Associated symptoms include fatigue. She has tried a PPI for the symptoms. The treatment provided moderate relief.  Diabetes She presents for her follow-up diabetic visit. She has type 2 diabetes mellitus. Hypoglycemia symptoms include nervousness/anxiousness. Associated symptoms include fatigue and foot paresthesias. Pertinent negatives for diabetes include no blurred vision. Symptoms are stable. Diabetic complications include peripheral neuropathy. Risk factors for coronary artery disease  include dyslipidemia, diabetes mellitus, hypertension, sedentary lifestyle and post-menopausal. She is following a generally healthy diet. (Does not check glucose)  Thyroid Problem Presents for follow-up visit. Symptoms include anxiety, constipation, depressed mood and fatigue. Patient reports no diarrhea or hoarse voice. The symptoms have been stable.  Hyperlipidemia This is a chronic problem. The current episode started more than 1 year ago. The problem is controlled. Recent lipid tests were reviewed and are normal. Exacerbating diseases include obesity. Pertinent negatives include no shortness of breath. Current antihyperlipidemic treatment includes statins. The current treatment provides moderate improvement of lipids. Risk factors for coronary artery disease include dyslipidemia, diabetes mellitus, hypertension, a sedentary lifestyle and post-menopausal.  Arthritis Presents for follow-up visit. She complains of pain and stiffness. The symptoms have been stable. Affected locations include the left knee and right knee. Her pain is at a severity of 9/10. Associated symptoms include fatigue. Pertinent negatives include no diarrhea.  Anxiety Presents for follow-up visit. Symptoms include depressed mood, excessive worry, insomnia, irritability, nervous/anxious behavior and restlessness. Patient reports no shortness of breath. Symptoms occur occasionally. The severity of symptoms is moderate.    Depression        This is a chronic problem.  The current episode started more than 1 year ago.   The problem occurs intermittently.  Associated symptoms include fatigue, insomnia, restlessness and sad.  Associated symptoms include no helplessness and no hopelessness.  Past medical history includes thyroid problem and anxiety.   Constipation This is a chronic problem. The current episode started more than 1 year ago. The problem has been waxing and waning since onset. Her stool frequency is 2 to 3 times per week.  Pertinent negatives include  no diarrhea. She has tried diet changes, stool softeners and fiber (linzess) for the symptoms. The treatment provided mild relief.      Review of Systems  Constitutional:  Positive for fatigue, irritability and malaise/fatigue.  HENT:  Negative for hoarse voice.   Eyes:  Negative for blurred vision.  Respiratory:  Negative for shortness of breath.   Gastrointestinal:  Positive for constipation and heartburn. Negative for diarrhea.  Musculoskeletal:  Positive for stiffness.  Psychiatric/Behavioral:  The patient is nervous/anxious and has insomnia.   All other systems reviewed and are negative.  Family History  Problem Relation Age of Onset   Heart attack Mother    Hypertension Mother    Stroke Mother        Deceased, age 69   Diabetes Sister    Arthritis Sister    Diabetes Sister    Cancer Sister        ovarian   Stroke Sister    Alzheimer's disease Sister    Diabetes Brother        also has a blood disorder    Clotting disorder Brother    Kidney disease Brother    Heart disease Brother    Melanoma Brother        deceased   Diabetes Son    Hypertension Son    Coronary artery disease Son 50       CABG   Stroke Son    Hypertension Son    Coronary artery disease Son 17       CABG   Colon cancer Neg Hx    Breast cancer Neg Hx    Social History   Socioeconomic History   Marital status: Married    Spouse name: Not on file   Number of children: 4   Years of education: 6   Highest education level: 6th grade  Occupational History   Occupation: Retired    Comment: Engineer, site  Tobacco Use   Smoking status: Never   Smokeless tobacco: Never  Vaping Use   Vaping status: Never Used  Substance and Sexual Activity   Alcohol use: Not Currently    Alcohol/week: 1.0 standard drink of alcohol    Types: 1 Glasses of wine per week   Drug use: No   Sexual activity: Not Currently    Birth control/protection: Post-menopausal  Other Topics  Concern   Not on file  Social History Narrative    Has 4 adult children. She lives home with one of her sons who recently had a stroke and needs 24 hour care. She is the primary caregiver and is now legally separated from her husband.    Social Drivers of Corporate investment banker Strain: Low Risk  (07/24/2022)   Overall Financial Resource Strain (CARDIA)    Difficulty of Paying Living Expenses: Not hard at all  Food Insecurity: No Food Insecurity (07/24/2022)   Hunger Vital Sign    Worried About Running Out of Food in the Last Year: Never true    Ran Out of Food in the Last Year: Never true  Transportation Needs: No Transportation Needs (07/24/2022)   PRAPARE - Administrator, Civil Service (Medical): No    Lack of Transportation (Non-Medical): No  Physical Activity: Insufficiently Active (07/24/2022)   Exercise Vital Sign    Days of Exercise per Week: 1 day    Minutes of Exercise per Session: 30 min  Stress: No Stress Concern Present (07/24/2022)   [name] of Occupational Health -  Occupational Stress Questionnaire    Feeling of Stress : Not at all  Social Connections: Moderately Integrated (07/24/2022)   Social Connection and Isolation Panel [NHANES]    Frequency of Communication with Friends and Family: More than three times a week    Frequency of Social Gatherings with Friends and Family: More than three times a week    Attends Religious Services: More than 4 times per year    Active Member of [employer] or Organizations: No    Attends Banker Meetings: Never    Marital Status: Married       Objective:   Physical Exam Vitals reviewed.  Constitutional:      General: She is not in acute distress.    Appearance: She is well-developed. She is obese.  HENT:     Head: Normocephalic and atraumatic.     Right Ear: Tympanic membrane normal.     Left Ear: Tympanic membrane normal.  Eyes:     Pupils: Pupils are equal, round, and reactive to light.   Neck:     Thyroid: No thyromegaly.  Cardiovascular:     Rate and Rhythm: Normal rate and regular rhythm.     Heart sounds: Normal heart sounds. No murmur heard. Pulmonary:     Effort: Pulmonary effort is normal. No respiratory distress.     Breath sounds: Normal breath sounds. No wheezing.  Abdominal:     General: Bowel sounds are normal. There is no distension.     Palpations: Abdomen is soft.     Tenderness: There is no abdominal tenderness.  Musculoskeletal:        General: Tenderness present.     Cervical back: Normal range of motion and neck supple.     Comments: Pain in left knee flexion and extension   Skin:    General: Skin is warm and dry.  Neurological:     Mental Status: She is alert and oriented to person, place, and time.     Cranial Nerves: No cranial nerve deficit.     Deep Tendon Reflexes: Reflexes are normal and symmetric.  Psychiatric:        Behavior: Behavior normal.        Thought Content: Thought content normal.        Judgment: Judgment normal.      BP 130/80   Pulse 84   Temp (!) 97.3 F (36.3 C) (Temporal)   Ht 5\' 2"  (1.575 m)   Wt 154 lb (69.9 kg)   BMI 28.17 kg/m      Assessment & Plan:  LEMYA GREENWELL comes in today with chief complaint of Follow-up (Follow up plan:/1 month to follow up on medications /)   Diagnosis and orders addressed: 1. Hyperlipidemia associated with type 2 diabetes mellitus (HCC) - CMP14+EGFR - Lipid panel  2. Hypertension associated with diabetes (HCC) - CMP14+EGFR  3. Osteoarthritis, unspecified osteoarthritis type, unspecified site - CMP14+EGFR  4. Constipation, unspecified constipation type - linaclotide (LINZESS) 145 MCG CAPS capsule; Take 1 capsule (145 mcg total) by mouth daily before breakfast.  Dispense: 90 capsule; Refill: 1 - CMP14+EGFR  5. Gastroesophageal reflux disease without esophagitis - CMP14+EGFR  6. Moderate episode of recurrent major depressive disorder (HCC) - CMP14+EGFR  7.  Paroxysmal atrial fibrillation (HCC) - CMP14+EGFR  8. Hypothyroidism, unspecified type  - CMP14+EGFR - TSH  9. GAD (generalized anxiety disorder) - CMP14+EGFR  10. Type 2 diabetes mellitus with other specified complication, without long-term current use of insulin (HCC)  -  Microalbumin / creatinine urine ratio - Bayer DCA Hb A1c Waived - CMP14+EGFR  11. Hx of non-ST elevation myocardial infarction (NSTEMI) - CMP14+EGFR  12. Back pain with sciatica - CMP14+EGFR  13. Annual physical exam (Primary) - Lipid panel - TSH   Will increase Linzess to 145 mg from 72 mcg Patient reviewed in [place] controlled database, no flags noted. Contract and drug screen are up to date.  Referral to Ortho Rybelsus resent to see if coverage is better. If rx is too expensive she will call and we will change.  Continue current medications  Health Maintenance reviewed Diet and exercise encouraged  Follow up plan: 1 month to follow up on medications   Jannifer Rodney, FNP

## 2023-07-18 NOTE — Patient Instructions (Signed)

## 2023-07-19 LAB — CMP14+EGFR
ALT: 12 IU/L (ref 0–32)
AST: 14 IU/L (ref 0–40)
Albumin: 4.1 g/dL (ref 3.7–4.7)
Alkaline Phosphatase: 85 IU/L (ref 44–121)
BUN/Creatinine Ratio: 16 (ref 12–28)
BUN: 13 mg/dL (ref 8–27)
Bilirubin Total: 0.9 mg/dL (ref 0.0–1.2)
CO2: 24 mmol/L (ref 20–29)
Calcium: 9.5 mg/dL (ref 8.7–10.3)
Chloride: 104 mmol/L (ref 96–106)
Creatinine, Ser: 0.83 mg/dL (ref 0.57–1.00)
Globulin, Total: 2.1 g/dL (ref 1.5–4.5)
Glucose: 138 mg/dL — ABNORMAL HIGH (ref 70–99)
Potassium: 4 mmol/L (ref 3.5–5.2)
Sodium: 143 mmol/L (ref 134–144)
Total Protein: 6.2 g/dL (ref 6.0–8.5)
eGFR: 70 mL/min/{1.73_m2} (ref >59)

## 2023-07-19 LAB — LIPID PANEL
Chol/HDL Ratio: 2.7 ratio (ref 0.0–4.4)
Cholesterol, Total: 118 mg/dL (ref 100–199)
HDL: 43 mg/dL (ref >39)
LDL Chol Calc (NIH): 58 mg/dL (ref 0–99)
Triglycerides: 85 mg/dL (ref 0–149)
VLDL Cholesterol Cal: 17 mg/dL (ref 5–40)

## 2023-07-19 LAB — MICROALBUMIN / CREATININE URINE RATIO
Creatinine, Urine: 98.1 mg/dL
Microalb/Creat Ratio: 11 mg/g{creat} (ref 0–29)
Microalbumin, Urine: 10.6 ug/mL

## 2023-07-19 LAB — TSH: TSH: 1.44 u[IU]/mL (ref 0.450–4.500)

## 2023-07-21 ENCOUNTER — Telehealth: Payer: Self-pay | Admitting: Orthopedic Surgery

## 2023-07-21 NOTE — Telephone Encounter (Signed)
 Dr. Ernesta Heading pt - pt lvm on 07/20/23 at 11:18am stating she was seen on 3/21 and she has been waiting to see if her insurance is going to approve gel injections and she hasn't heard anything.  [phone]

## 2023-07-22 ENCOUNTER — Telehealth: Payer: Self-pay

## 2023-07-22 DIAGNOSIS — M1712 Unilateral primary osteoarthritis, left knee: Secondary | ICD-10-CM

## 2023-07-22 NOTE — Telephone Encounter (Signed)
 Talked with patient concerning gel injection and advised her that this has been submitted and once approved, she will get a call to schedule.

## 2023-07-22 NOTE — Telephone Encounter (Signed)
 Please schedule patient 3 appts.with Dr. Ernesta Heading for gel injection  All information has been added to patient's chart under the referrals tab.

## 2023-07-22 NOTE — Telephone Encounter (Signed)
 VOB has been submitted for orthovisc, left knee

## 2023-07-23 ENCOUNTER — Telehealth: Payer: Self-pay

## 2023-07-31 ENCOUNTER — Ambulatory Visit

## 2023-07-31 ENCOUNTER — Ambulatory Visit (INDEPENDENT_AMBULATORY_CARE_PROVIDER_SITE_OTHER)

## 2023-07-31 DIAGNOSIS — E1169 Type 2 diabetes mellitus with other specified complication: Secondary | ICD-10-CM | POA: Diagnosis not present

## 2023-07-31 LAB — HM DIABETES EYE EXAM

## 2023-07-31 NOTE — Progress Notes (Signed)
 Rhonda Garcia arrived 07/31/2023 and has given verbal consent to obtain images and complete their overdue diabetic retinal screening.  The images have been sent to an ophthalmologist or optometrist for review and interpretation.  Results will be sent back to Yevette Hem, FNP for review.  Patient has been informed they will be contacted when we receive the results via telephone or MyChart

## 2023-08-01 ENCOUNTER — Encounter: Payer: Self-pay | Admitting: Family Medicine

## 2023-08-01 ENCOUNTER — Ambulatory Visit (INDEPENDENT_AMBULATORY_CARE_PROVIDER_SITE_OTHER): Admitting: Family Medicine

## 2023-08-01 VITALS — BP 122/70 | HR 82 | Ht 62.0 in | Wt 153.0 lb

## 2023-08-01 DIAGNOSIS — R42 Dizziness and giddiness: Secondary | ICD-10-CM | POA: Diagnosis not present

## 2023-08-01 DIAGNOSIS — K581 Irritable bowel syndrome with constipation: Secondary | ICD-10-CM | POA: Diagnosis not present

## 2023-08-01 MED ORDER — LACTULOSE 10 GM/15ML PO SOLN: 10.0000 g | Freq: Every day | ORAL | 0 refills | Status: DC | PRN

## 2023-08-01 MED ORDER — LINACLOTIDE 290 MCG PO CAPS: 290.0000 ug | ORAL_CAPSULE | Freq: Every day | ORAL | 1 refills | Status: DC

## 2023-08-01 MED ORDER — LINACLOTIDE 290 MCG PO CAPS: 290.0000 ug | ORAL_CAPSULE | Freq: Every day | ORAL | Status: DC

## 2023-08-01 MED ORDER — MECLIZINE HCL 12.5 MG PO TABS: 12.5000 mg | ORAL_TABLET | Freq: Three times a day (TID) | ORAL | 0 refills | Status: AC | PRN

## 2023-08-01 NOTE — Addendum Note (Signed)
 Addended by: Francine Iron on: 08/01/2023 11:25 AM   Modules accepted: Orders

## 2023-08-01 NOTE — Progress Notes (Signed)
 BP 122/70   Pulse 82   Ht 5\' 2"  (1.575 m)   Wt 153 lb (69.4 kg)   SpO2 97%   BMI 27.98 kg/m    Subjective:   Patient ID: Rhonda Garcia, female    DOB: 05-25-1940, 83 y.o.   MRN: [id number]  HPI: Rhonda Garcia is a 83 y.o. female presenting on 08/01/2023 for Constipation, Abdominal Pain, Dizziness, and Nausea   HPI Constipation and irritable bowels Patient has been dealing with constipation and irritable bowels for a long time.  She has bowel movements about once per week.  She has been trying to use Linzess  and coffee and MiraLAX  and Benefiber and they even tried to do a colonoscopy she says last year but were unable to because she could not clear her bowels enough to get a proper colonoscopy.  She says she started to feel little bit of nausea but then she is also had some dizziness and spinning sensation and vertigo going on over the past week so she does not know if nauseous from that or if it is from the bowel issues.  She denies any fevers or chills or blood in her stool.  She does have to push or strain to have bowel movements.  Relevant past medical, surgical, family and social history reviewed and updated as indicated. Interim medical history since our last visit reviewed. Allergies and medications reviewed and updated.  Review of Systems  Constitutional:  Negative for chills and fever.  HENT:  Negative for congestion, ear discharge and ear pain.   Eyes:  Negative for redness and visual disturbance.  Respiratory:  Negative for chest tightness and shortness of breath.   Cardiovascular:  Negative for chest pain and leg swelling.  Gastrointestinal:  Positive for abdominal distention, constipation and nausea. Negative for diarrhea and vomiting.  Genitourinary:  Negative for difficulty urinating and dysuria.  Musculoskeletal:  Negative for back pain and gait problem.  Skin:  Negative for rash.  Neurological:  Positive for dizziness. Negative for light-headedness and  headaches.  Psychiatric/Behavioral:  Negative for agitation and behavioral problems.   All other systems reviewed and are negative.   Per HPI unless specifically indicated above   Allergies as of 08/01/2023       Reactions   Fentanyl  Anaphylaxis   Promethazine  Hcl Anaphylaxis   Jardiance  [empagliflozin ]    Vaginal burning/raw   Metformin  And Related    Leg pain   Farxiga  [dapagliflozin ] Other (See Comments)   Recurrent yeast infections        Medication List        Accurate as of August 01, 2023 11:17 AM. If you have any questions, ask your nurse or doctor.          acetaminophen  650 MG CR tablet Commonly known as: TYLENOL  Take 650 mg by mouth every 8 (eight) hours as needed for pain.   ALPRAZolam  0.5 MG tablet Commonly known as: XANAX  Take 1 tablet (0.5 mg total) by mouth daily as needed.   Baclofen  5 MG Tabs Take 1 tablet (5 mg total) by mouth 3 (three) times daily as needed.   BENEFIBER PO Take 1 Dose by mouth daily.   busPIRone  5 MG tablet Commonly known as: BUSPAR  Take 5 mg by mouth 3 (three) times daily.   hydroxychloroquine  200 MG tablet Commonly known as: PLAQUENIL  TAKE ONE (1) TABLET BY MOUTH EVERY DAY   isosorbide  mononitrate 60 MG 24 hr tablet Commonly known as: IMDUR  Take 60 mg by  mouth daily.   lactulose  10 GM/15ML solution Commonly known as: CHRONULAC  Take 15 mLs (10 g total) by mouth daily as needed for mild constipation or moderate constipation.   levothyroxine  50 MCG tablet Commonly known as: SYNTHROID  TAKE ONE (1) TABLET EACH DAY   linaclotide  290 MCG Caps capsule Commonly known as: Linzess  Take 1 capsule (290 mcg total) by mouth daily before breakfast. What changed:  medication strength how much to take   lisinopril  10 MG tablet Commonly known as: ZESTRIL  Take 10 mg by mouth daily.   meclizine  12.5 MG tablet Commonly known as: ANTIVERT  Take 1 tablet (12.5 mg total) by mouth 3 (three) times daily as needed for dizziness  (Vertigo). What changed: reasons to take this   metoprolol  tartrate 50 MG tablet Commonly known as: LOPRESSOR  Take 1 tablet (50 mg total) by mouth 2 (two) times daily.   nitroGLYCERIN  0.4 MG SL tablet Commonly known as: NITROSTAT  DISSOLVE 1 TAB UNDER TOUNGE FOR CHEST PAIN. MAY REPEAT EVERY 5 MINUTES FOR 3 DOSES. IF NO RELIEF CALL 911 OR GO TO ER   pantoprazole  40 MG tablet Commonly known as: PROTONIX  Take 1 tablet (40 mg total) by mouth 2 (two) times daily.   polyethylene glycol 17 g packet Commonly known as: MIRALAX  / GLYCOLAX  Take 17 g by mouth daily.   pravastatin  80 MG tablet Commonly known as: PRAVACHOL  Take 1 tablet (80 mg total) by mouth every evening.   Rybelsus  7 MG Tabs Generic drug: Semaglutide  Take 1 tablet (7 mg total) by mouth daily.   traMADol  50 MG tablet Commonly known as: ULTRAM  Take 1 tablet (50 mg total) by mouth every 8 (eight) hours as needed.         Objective:   BP 122/70   Pulse 82   Ht 5\' 2"  (1.575 m)   Wt 153 lb (69.4 kg)   SpO2 97%   BMI 27.98 kg/m   Wt Readings from Last 3 Encounters:  08/01/23 153 lb (69.4 kg)  07/18/23 154 lb (69.9 kg)  06/28/23 153 lb (69.4 kg)    Physical Exam Vitals and nursing note reviewed.  Constitutional:      General: She is not in acute distress.    Appearance: She is well-developed. She is not diaphoretic.  Eyes:     Conjunctiva/sclera: Conjunctivae normal.  Cardiovascular:     Rate and Rhythm: Normal rate and regular rhythm.     Heart sounds: Normal heart sounds. No murmur heard. Pulmonary:     Effort: Pulmonary effort is normal. No respiratory distress.     Breath sounds: Normal breath sounds. No wheezing.  Abdominal:     General: Abdomen is flat. Bowel sounds are normal. There is no distension.     Palpations: Abdomen is soft.     Tenderness: There is no abdominal tenderness. There is no right CVA tenderness, left CVA tenderness, guarding or rebound.     Hernia: No hernia is present.   Musculoskeletal:        General: No swelling or tenderness. Normal range of motion.  Skin:    General: Skin is warm and dry.     Findings: No rash.  Neurological:     Mental Status: She is alert and oriented to person, place, and time.     Coordination: Coordination normal.  Psychiatric:        Behavior: Behavior normal.       Assessment & Plan:   Problem List Items Addressed This Visit  Other   Vertigo - Primary   Relevant Medications   meclizine  (ANTIVERT ) 12.5 MG tablet   Other Visit Diagnoses       Irritable bowel syndrome with constipation       Relevant Medications   Wheat Dextrin (BENEFIBER PO)   polyethylene glycol (MIRALAX  / GLYCOLAX ) 17 g packet   linaclotide  (LINZESS ) 290 MCG CAPS capsule   meclizine  (ANTIVERT ) 12.5 MG tablet   lactulose  (CHRONULAC ) 10 GM/15ML solution       Will switch from MiraLAX  to using the lactulose  and increase her Linzess  to 290 mcg, gave her a sample for that.  Gave meclizine  for vertigo. Follow up plan: Return if symptoms worsen or fail to improve.  Counseling provided for all of the vaccine components No orders of the defined types were placed in this encounter.   Jolyne Needs, MD [facility] 08/01/2023, 11:17 AM

## 2023-08-06 ENCOUNTER — Encounter: Payer: Self-pay | Admitting: Orthopedic Surgery

## 2023-08-06 ENCOUNTER — Ambulatory Visit: Admitting: Orthopedic Surgery

## 2023-08-06 DIAGNOSIS — M1712 Unilateral primary osteoarthritis, left knee: Secondary | ICD-10-CM | POA: Diagnosis not present

## 2023-08-06 NOTE — Patient Instructions (Signed)
Instructions Following Joint Injections  In clinic today, you received an injection in one of your joints (sometimes more than one).  Occasionally, you can have some pain at the injection site, this is normal.  You can place ice at the injection site, or take over-the-counter medications such as Tylenol (acetaminophen) or Advil (ibuprofen).  Please follow all directions listed on the bottle.  If your joint (knee or shoulder) becomes swollen, red or very painful, please contact the clinic for additional assistance.   Injections in the same joint cannot be repeated for 3 months.  This helps to limit the risk of an infection in the joint.  If you were to develop an infection in your joint, the best treatment option would be surgery.

## 2023-08-07 NOTE — Progress Notes (Signed)
 Return patient Visit  Assessment: Rhonda Garcia is a 83 y.o. female with the following: 1. Arthritis of left knee   Plan: Rhonda Garcia has advanced degenerative changes in the left knee.  She would like to proceed with HA injection in the left knee.  1st injection completed today.  Follow up in 1 week for next injection.  This patient is diagnosed with osteoarthritis of the knee(s).    Radiographs show evidence of joint space narrowing, osteophytes, subchondral sclerosis and/or subchondral cysts.  This patient has knee pain which interferes with functional and activities of daily living.    This patient has experienced inadequate response, adverse effects and/or intolerance with conservative treatments such as acetaminophen , NSAIDS, topical creams, physical therapy or regular exercise, knee bracing and/or weight loss.   This patient has experienced inadequate response or has a contraindication to intra articular steroid injections for at least 3 months.   This patient is not scheduled to have a total knee replacement within 6 months of starting treatment with viscosupplementation.  Procedure note injection Left knee joint   Verbal consent was obtained to inject the right knee joint  Timeout was completed to confirm the site of injection.  The skin was prepped with alcohol and ethyl chloride was sprayed at the injection site.  A 21-gauge needle was used to inject Orthovisc hyaluronic acid into the right knee using an anterolateral approach.  There were no complications. A sterile bandage was applied.   Follow-up: Return in about 1 week (around 08/13/2023).  Subjective:  Chief Complaint  Patient presents with   Injections    Left knee     History of Present Illness: Rhonda Garcia is a 83 y.o. female who returns for evaluation of left knee pain.  She has previously had injections.  She would like to try HA injections.  She is here for her first injection.     Review of Systems: No fevers or chills No numbness or tingling No chest pain No shortness of breath No bowel or bladder dysfunction No GI distress No headaches    Objective: There were no vitals taken for this visit.  Physical Exam:  General: Elderly female., Alert and oriented., and No acute distress. Gait: Left sided antalgic gait.  Evaluation left knee demonstrates a moderate effusion.  Varus alignment overall.  She has a Baker's cyst and tenderness in the posterior aspect of the knee.  5-10 degrees short of full extension.  She tolerates flexion to 100 degrees.  No increased laxity varus or valgus stress.  IMAGING:  No new imaging obtained today  New Medications:  No orders of the defined types were placed in this encounter.     Tonita Frater, MD  08/07/2023 8:22 AM

## 2023-08-13 ENCOUNTER — Ambulatory Visit: Admitting: Orthopedic Surgery

## 2023-08-13 DIAGNOSIS — M1712 Unilateral primary osteoarthritis, left knee: Secondary | ICD-10-CM | POA: Diagnosis not present

## 2023-08-13 NOTE — Progress Notes (Signed)
 Return patient Visit  Assessment: Rhonda Garcia is a 83 y.o. female with the following: 1. Arthritis of left knee   Plan: HIROMY BRANDI has advanced degenerative changes in the left knee.  She returns this week for her second injection of hyaluronic acid.  This was completed without issues.  I will see her in 1 week for the third and final injection.  This patient is diagnosed with osteoarthritis of the knee(s).    Radiographs show evidence of joint space narrowing, osteophytes, subchondral sclerosis and/or subchondral cysts.  This patient has knee pain which interferes with functional and activities of daily living.    This patient has experienced inadequate response, adverse effects and/or intolerance with conservative treatments such as acetaminophen , NSAIDS, topical creams, physical therapy or regular exercise, knee bracing and/or weight loss.   This patient has experienced inadequate response or has a contraindication to intra articular steroid injections for at least 3 months.   This patient is not scheduled to have a total knee replacement within 6 months of starting treatment with viscosupplementation.  Procedure note injection Left knee joint   Verbal consent was obtained to inject the right knee joint  Timeout was completed to confirm the site of injection.  The skin was prepped with alcohol and ethyl chloride was sprayed at the injection site.  A 21-gauge needle was used to inject Orthovisc hyaluronic acid into the right knee using an anterolateral approach.  There were no complications. A sterile bandage was applied.   Follow-up: Return in about 1 week (around 08/20/2023).  Subjective:  Chief Complaint  Patient presents with   Injections    Ortho VIsc #2 L knee WUJ:[id number] AOZ:[id number] Exp:09/03/24     History of Present Illness: Rhonda Garcia is a 83 y.o. female who returns for evaluation of left knee pain.  I saw her last week for the  first injection of HA.  She states that her left knee is irritable currently.  She has slightly increased pain.  No issues with the injection otherwise.  She is here for her second injection.   Review of Systems: No fevers or chills No numbness or tingling No chest pain No shortness of breath No bowel or bladder dysfunction No GI distress No headaches    Objective: There were no vitals taken for this visit.  Physical Exam:  General: Elderly female., Alert and oriented., and No acute distress. Gait: Left sided antalgic gait.  Evaluation left knee demonstrates a moderate effusion.  Varus alignment overall.  She has a Baker's cyst and tenderness in the posterior aspect of the knee.  5-10 degrees short of full extension.  She tolerates flexion to 100 degrees.  No increased laxity varus or valgus stress.  IMAGING:  No new imaging obtained today  New Medications:  No orders of the defined types were placed in this encounter.     Tonita Frater, MD  08/13/2023 1:39 PM

## 2023-08-13 NOTE — Patient Instructions (Signed)
Instructions Following Joint Injections  In clinic today, you received an injection in one of your joints (sometimes more than one).  Occasionally, you can have some pain at the injection site, this is normal.  You can place ice at the injection site, or take over-the-counter medications such as Tylenol (acetaminophen) or Advil (ibuprofen).  Please follow all directions listed on the bottle.  If your joint (knee or shoulder) becomes swollen, red or very painful, please contact the clinic for additional assistance.   Injections in the same joint cannot be repeated for 3 months.  This helps to limit the risk of an infection in the joint.  If you were to develop an infection in your joint, the best treatment option would be surgery.

## 2023-08-20 ENCOUNTER — Ambulatory Visit: Admitting: Orthopedic Surgery

## 2023-08-22 NOTE — Telephone Encounter (Signed)
 fyi

## 2023-08-27 ENCOUNTER — Ambulatory Visit: Admitting: Orthopedic Surgery

## 2023-08-27 DIAGNOSIS — M1712 Unilateral primary osteoarthritis, left knee: Secondary | ICD-10-CM

## 2023-08-27 NOTE — Progress Notes (Signed)
 Return patient Visit  Assessment: Rhonda Garcia is a 83 y.o. female with the following: 1. Arthritis of left knee   Plan: [name] has advanced degenerative changes in the left knee.  She returns to clinic today for her final HA injection.  She feels as though her knee is better.  She notes less aching at night.  No issues with previous injections.  She would like to proceed with her final injection today.   This patient is diagnosed with osteoarthritis of the knee(s).    Radiographs show evidence of joint space narrowing, osteophytes, subchondral sclerosis and/or subchondral cysts.  This patient has knee pain which interferes with functional and activities of daily living.    This patient has experienced inadequate response, adverse effects and/or intolerance with conservative treatments such as acetaminophen , NSAIDS, topical creams, physical therapy or regular exercise, knee bracing and/or weight loss.   This patient has experienced inadequate response or has a contraindication to intra articular steroid injections for at least 3 months.   This patient is not scheduled to have a total knee replacement within 6 months of starting treatment with viscosupplementation.  Procedure note injection Left knee joint   Verbal consent was obtained to inject the right knee joint  Timeout was completed to confirm the site of injection.  The skin was prepped with alcohol and ethyl chloride was sprayed at the injection site.  A 21-gauge needle was used to inject Orthovisc hyaluronic acid into the right knee using an anterolateral approach.  There were no complications. A sterile bandage was applied.   Follow-up: Return if symptoms worsen or fail to improve.  Subjective:  Chief Complaint  Patient presents with   Injections    L knee #3 Ortho Visc  WUJ:[id number] AOZ:[id number] Exp:04/02/25    History of Present Illness: Rhonda Garcia is a 83 y.o. female who  returns for evaluation of left knee pain.  She has completed 2 injections of HA.  She has had no issues.  Due to unforeseen circumstances, she was unable to come last week as the office was closed.  She notes improvement in her symptoms.  Less aching at night.  It is not waking her up at night as much.  She does still have some stiffness in the left knee.  Pain is primarily medial.   Review of Systems: No fevers or chills No numbness or tingling No chest pain No shortness of breath No bowel or bladder dysfunction No GI distress No headaches    Objective: There were no vitals taken for this visit.  Physical Exam:  General: Elderly female., Alert and oriented., and No acute distress. Gait: Left sided antalgic gait.  Evaluation left knee demonstrates a moderate effusion.  Varus alignment overall.  She has a Baker's cyst and tenderness in the posterior aspect of the knee.  5-10 degrees short of full extension.  She tolerates flexion to 100 degrees.  No increased laxity varus or valgus stress.  IMAGING:  No new imaging obtained today  New Medications:  No orders of the defined types were placed in this encounter.     Tonita Frater, MD  08/27/2023 9:28 AM

## 2023-08-27 NOTE — Patient Instructions (Signed)
Instructions Following Joint Injections  In clinic today, you received an injection in one of your joints (sometimes more than one).  Occasionally, you can have some pain at the injection site, this is normal.  You can place ice at the injection site, or take over-the-counter medications such as Tylenol (acetaminophen) or Advil (ibuprofen).  Please follow all directions listed on the bottle.  If your joint (knee or shoulder) becomes swollen, red or very painful, please contact the clinic for additional assistance.   Injections in the same joint cannot be repeated for 3 months.  This helps to limit the risk of an infection in the joint.  If you were to develop an infection in your joint, the best treatment option would be surgery.

## 2023-09-17 ENCOUNTER — Ambulatory Visit: Payer: Self-pay

## 2023-09-17 NOTE — Telephone Encounter (Signed)
 Called and schedule with hawks on Monday

## 2023-09-17 NOTE — Telephone Encounter (Addendum)
 FYI Only or Action Required?: Action required by provider   Pt states that Kathaleen Pale will work her in to be seen tomorrow.   Patient was last seen in primary care on 08/01/2023 by Dettinger, Lucio Sabin, MD. Called Nurse Triage reporting Knee Pain. Symptoms began several days ago. Interventions attempted: Other: Had shot into knee at [facility]. Symptoms are: Knee pain, doesn't feel good, hands are crampinggradually worsening.  Triage Disposition: See PCP When Office is Open (Within 3 Days)  Patient/caregiver understands and will follow disposition?: no - pt wants to be worked in for tomorrow.       Copied from CRM [id number]. Topic: Clinical - Red Word Triage >> Sep 17, 2023  2:49 PM Hamp Levine R wrote: Red Word that prompted transfer to Nurse Triage: Patient states she just got a shot for her knees but she is experiencing a lot of pain. Also states swollen feet and her hand and fingers have been locking up, unable to bend them all the way. Reason for Disposition  [1] MODERATE pain (e.g., interferes with normal activities, limping) AND [2] present > 3 days  Answer Assessment - Initial Assessment Questions 1. LOCATION and RADIATION: "Where is the pain located?"      knees 2. QUALITY: "What does the pain feel like?"  (e.g., sharp, dull, aching, burning)     sharp 3. SEVERITY: "How bad is the pain?" "What does it keep you from doing?"   (Scale 1-10; or mild, moderate, severe)   -  MILD (1-3): doesn't interfere with normal activities    -  MODERATE (4-7): interferes with normal activities (e.g., work or school) or awakens from sleep, limping    -  SEVERE (8-10): excruciating pain, unable to do any normal activities, unable to walk     Moderate - severe 4. ONSET: "When did the pain start?" "Does it come and go, or is it there all the time?"     Ongoing 5. RECURRENT: "Have you had this pain before?" If Yes, ask: "When, and what happened then?"     yes 8. ASSOCIATED SYMPTOMS: "Is there any swelling or  redness of the knee?"     Feet were swollen 9. OTHER SYMPTOMS: "Do you have any other symptoms?" (e.g., chest pain, difficulty breathing, fever, calf pain)     Just doesn't feel good. Hands are cramping up  Protocols used: Knee Pain-A-AH

## 2023-09-19 ENCOUNTER — Other Ambulatory Visit: Payer: Self-pay | Admitting: Family

## 2023-09-23 ENCOUNTER — Ambulatory Visit (INDEPENDENT_AMBULATORY_CARE_PROVIDER_SITE_OTHER): Admitting: Family

## 2023-09-23 ENCOUNTER — Ambulatory Visit (INDEPENDENT_AMBULATORY_CARE_PROVIDER_SITE_OTHER)

## 2023-09-23 ENCOUNTER — Encounter: Payer: Self-pay | Admitting: Family

## 2023-09-23 VITALS — BP 127/73 | HR 93 | Temp 98.2°F | Ht 62.0 in | Wt 153.2 lb

## 2023-09-23 DIAGNOSIS — M1712 Unilateral primary osteoarthritis, left knee: Secondary | ICD-10-CM

## 2023-09-23 DIAGNOSIS — Z780 Asymptomatic menopausal state: Secondary | ICD-10-CM

## 2023-09-23 DIAGNOSIS — S20462A Insect bite (nonvenomous) of left back wall of thorax, initial encounter: Secondary | ICD-10-CM | POA: Diagnosis not present

## 2023-09-23 DIAGNOSIS — M25562 Pain in left knee: Secondary | ICD-10-CM | POA: Diagnosis not present

## 2023-09-23 DIAGNOSIS — W57XXXA Bitten or stung by nonvenomous insect and other nonvenomous arthropods, initial encounter: Secondary | ICD-10-CM

## 2023-09-23 DIAGNOSIS — Z23 Encounter for immunization: Secondary | ICD-10-CM

## 2023-09-23 MED ORDER — DICLOFENAC SODIUM 75 MG PO TBEC: 75.0000 mg | DELAYED_RELEASE_TABLET | Freq: Two times a day (BID) | ORAL | 2 refills | Status: DC

## 2023-09-23 MED ORDER — DOXYCYCLINE HYCLATE 100 MG PO TABS: 200.0000 mg | ORAL_TABLET | Freq: Once | ORAL | 0 refills | Status: AC

## 2023-09-23 NOTE — Addendum Note (Signed)
 Addended by: Lisbeth Rides on: 09/23/2023 03:58 PM   Modules accepted: Orders

## 2023-09-23 NOTE — Patient Instructions (Signed)

## 2023-09-23 NOTE — Progress Notes (Signed)
 Subjective:    Patient ID: Rhonda Garcia, female    DOB: Apr 02, 1941, 83 y.o.   MRN: [id number]  Chief Complaint  Patient presents with   Knee Pain   tick bites   Pt presents to the office today with left knee pain. She is followed by Ortho and had three gel injections without relief. Reports her knee hurts worse now.   She went to Urgent Care last week and had a steroid injection. She had traveled to [place] with a lot walking and had increased pain since.   She had two ticks on her left back yesterday. Reports they were attached for a couple days.  Knee Pain  The incident occurred more than 1 week ago. There was no injury mechanism. The pain is present in the left knee. The pain is at a severity of 10/10. The pain is moderate. The pain has been Intermittent since onset. The symptoms are aggravated by movement. She has tried NSAIDs and rest for the symptoms. The treatment provided moderate relief.      Review of Systems  All other systems reviewed and are negative.   Social History   Socioeconomic History   Marital status: Married    Spouse name: Not on file   Number of children: 4   Years of education: 6   Highest education level: 6th grade  Occupational History   Occupation: Retired    Comment: Engineer, site  Tobacco Use   Smoking status: Never   Smokeless tobacco: Never  Vaping Use   Vaping status: Never Used  Substance and Sexual Activity   Alcohol use: Not Currently    Alcohol/week: 1.0 standard drink of alcohol    Types: 1 Glasses of wine per week   Drug use: No   Sexual activity: Not Currently    Birth control/protection: Post-menopausal  Other Topics Concern   Not on file  Social History Narrative    Has 4 adult children. She lives home with one of her sons who recently had a stroke and needs 24 hour care. She is the primary caregiver and is now legally separated from her husband.    Social Drivers of Corporate investment banker Strain: Low Risk   (07/24/2022)   Overall Financial Resource Strain (CARDIA)    Difficulty of Paying Living Expenses: Not hard at all  Food Insecurity: No Food Insecurity (07/24/2022)   Hunger Vital Sign    Worried About Running Out of Food in the Last Year: Never true    Ran Out of Food in the Last Year: Never true  Transportation Needs: No Transportation Needs (07/24/2022)   PRAPARE - Administrator, Civil Service (Medical): No    Lack of Transportation (Non-Medical): No  Physical Activity: Insufficiently Active (07/24/2022)   Exercise Vital Sign    Days of Exercise per Week: 1 day    Minutes of Exercise per Session: 30 min  Stress: No Stress Concern Present (07/24/2022)   [name] of Occupational Health - Occupational Stress Questionnaire    Feeling of Stress : Not at all  Social Connections: Moderately Integrated (07/24/2022)   Social Connection and Isolation Panel    Frequency of Communication with Friends and Family: More than three times a week    Frequency of Social Gatherings with Friends and Family: More than three times a week    Attends Religious Services: More than 4 times per year    Active Member of Clubs or Organizations: No  Attends Banker Meetings: Never    Marital Status: Married   Family History  Problem Relation Age of Onset   Heart attack Mother    Hypertension Mother    Stroke Mother        Deceased, age 7   Diabetes Sister    Arthritis Sister    Diabetes Sister    Cancer Sister        ovarian   Stroke Sister    Alzheimer's disease Sister    Diabetes Brother        also has a blood disorder    Clotting disorder Brother    Kidney disease Brother    Heart disease Brother    Melanoma Brother        deceased   Diabetes Son    Hypertension Son    Coronary artery disease Son 58       CABG   Stroke Son    Hypertension Son    Coronary artery disease Son 36       CABG   Colon cancer Neg Hx    Breast cancer Neg Hx          Objective:   Physical Exam Vitals reviewed.  Constitutional:      General: She is not in acute distress.    Appearance: She is well-developed.  HENT:     Head: Normocephalic and atraumatic.     Right Ear: Tympanic membrane normal.     Left Ear: Tympanic membrane normal.   Eyes:     Pupils: Pupils are equal, round, and reactive to light.   Neck:     Thyroid : No thyromegaly.   Cardiovascular:     Rate and Rhythm: Normal rate and regular rhythm.     Heart sounds: Normal heart sounds. No murmur heard. Pulmonary:     Effort: Pulmonary effort is normal. No respiratory distress.     Breath sounds: Normal breath sounds. No wheezing.  Abdominal:     General: Bowel sounds are normal. There is no distension.     Palpations: Abdomen is soft.     Tenderness: There is no abdominal tenderness.   Musculoskeletal:        General: Tenderness (pain in left knee with flexion and extension) present. Normal range of motion.     Cervical back: Normal range of motion and neck supple.   Skin:    General: Skin is warm and dry.   Neurological:     Mental Status: She is alert and oriented to person, place, and time.     Cranial Nerves: No cranial nerve deficit.     Deep Tendon Reflexes: Reflexes are normal and symmetric.   Psychiatric:        Behavior: Behavior normal.        Thought Content: Thought content normal.        Judgment: Judgment normal.       BP 127/73   Pulse 93   Temp 98.2 F (36.8 C) (Temporal)   Ht 5' 2 (1.575 m)   Wt 153 lb 3.2 oz (69.5 kg)   SpO2 97%   BMI 28.02 kg/m      Assessment & Plan:  Rhonda Garcia comes in today with chief complaint of Knee Pain and tick bites   Diagnosis and orders addressed:  1. Primary osteoarthritis of left knee (Primary) Rest Ice ROM exercises Keep follow up with Ortho Start diclofenac  BID with food  No other NSAID's  Can take tylenol  as  needed  - diclofenac  (VOLTAREN ) 75 MG EC tablet; Take 1 tablet (75 mg  total) by mouth 2 (two) times daily.  Dispense: 60 tablet; Refill: 2 - DG Knee 1-2 Views Left; Future  2. Tick bite of left back wall of thorax, initial encounter -Pt to report any new fever, joint pain, or rash -Wear protective clothing while outside- Long sleeves and long pants -Put insect repellent on all exposed skin and along clothing -Take a shower as soon as possible after being outside Follow up if symptoms worsen or do not improve  - doxycycline (VIBRA-TABS) 100 MG tablet; Take 2 tablets (200 mg total) by mouth once for 1 dose.  Dispense: 2 tablet; Refill: 0  3. Post-menopausal - DG [facility] DEXA   Labs pending Continue current medications  Keep follow up with specialists  Health Maintenance reviewed Keep chronic follow up   Tommas Fragmin, FNP

## 2023-09-25 ENCOUNTER — Telehealth: Payer: Self-pay | Admitting: Family

## 2023-09-25 DIAGNOSIS — Z0279 Encounter for issue of other medical certificate: Secondary | ICD-10-CM

## 2023-09-25 NOTE — Telephone Encounter (Signed)
 Rhonda Garcia dropped off 2-Handicap forms to be completed and signed.  Form Fee Paid? (Y/N)   y         If NO, form is placed on front office manager desk to hold until payment received. If YES, then form will be placed in the RX/HH Nurse Coordinators box for completion.  Form will not be processed until payment is received

## 2023-09-26 DIAGNOSIS — Z780 Asymptomatic menopausal state: Secondary | ICD-10-CM | POA: Diagnosis not present

## 2023-09-26 DIAGNOSIS — M85832 Other specified disorders of bone density and structure, left forearm: Secondary | ICD-10-CM | POA: Diagnosis not present

## 2023-09-30 ENCOUNTER — Ambulatory Visit: Payer: Self-pay | Admitting: Family

## 2023-09-30 NOTE — Telephone Encounter (Signed)
Aware handicap form ready

## 2023-10-15 ENCOUNTER — Ambulatory Visit (INDEPENDENT_AMBULATORY_CARE_PROVIDER_SITE_OTHER)

## 2023-10-15 VITALS — BP 127/73 | HR 93 | Ht 62.0 in | Wt 153.0 lb

## 2023-10-15 DIAGNOSIS — Z0000 Encounter for general adult medical examination without abnormal findings: Secondary | ICD-10-CM | POA: Diagnosis not present

## 2023-10-15 NOTE — Patient Instructions (Signed)
 Rhonda Garcia , Thank you for taking time out of your busy schedule to complete your Annual Wellness Visit with me. I enjoyed our conversation and look forward to speaking with you again next year. I, as well as your care team,  appreciate your ongoing commitment to your health goals. Please review the following plan we discussed and let me know if I can assist you in the future. Your Game plan/ To Do List    Follow up Visits: Next Medicare AWV with our clinical staff: 10/15/24 at 12:30p.m.   Have you seen your provider in the last 6 months (3 months if uncontrolled diabetes)? Yes Next Office Visit with your provider: 7/11/25at 11:40a.m.  Clinician Recommendations:  Aim for 30 minutes of exercise or brisk walking, 6-8 glasses of water , and 5 servings of fruits and vegetables each day.       This is a list of the screening recommended for you and due dates:  Health Maintenance  Topic Date Due   COVID-19 Vaccine (3 - Moderna risk series) 09/01/2019   Medicare Annual Wellness Visit  07/24/2023   Flu Shot  11/08/2023   Complete foot exam   01/08/2024   Hemoglobin A1C  01/17/2024   Mammogram  06/16/2024   Yearly kidney function blood test for diabetes  07/17/2024   Yearly kidney health urinalysis for diabetes  07/17/2024   Eye exam for diabetics  07/30/2024   DEXA scan (bone density measurement)  09/26/2026   DTaP/Tdap/Td vaccine (3 - Td or Tdap) 09/22/2033   Pneumococcal Vaccine for age over 33  Completed   Zoster (Shingles) Vaccine  Completed   Hepatitis B Vaccine  Aged Out   HPV Vaccine  Aged Out   Meningitis B Vaccine  Aged Out   Colon Cancer Screening  Discontinued   Hepatitis C Screening  Discontinued    Advanced directives: (Declined) Advance directive discussed with you today. Even though you declined this today, please call our office should you change your mind, and we can give you the proper paperwork for you to fill out. Advance Care Planning is important because it:  [x]   Makes sure you receive the medical care that is consistent with your values, goals, and preferences  [x]  It provides guidance to your family and loved ones and reduces their decisional burden about whether or not they are making the right decisions based on your wishes.  Follow the link provided in your after visit summary or read over the paperwork we have mailed to you to help you started getting your Advance Directives in place. If you need assistance in completing these, please reach out to us  so that we can help you!  See attachments for Preventive Care and Fall Prevention Tips.

## 2023-10-15 NOTE — Progress Notes (Signed)
 Subjective:   Rhonda Garcia is a 83 y.o. who presents for a Medicare Wellness preventive visit.  As a reminder, Annual Wellness Visits don't include a physical exam, and some assessments may be limited, especially if this visit is performed virtually. We may recommend an in-person follow-up visit with your provider if needed.  Visit Complete: Virtual I connected with  Rhonda Garcia on 10/15/23 by a audio enabled telemedicine application and verified that I am speaking with the correct person using two identifiers.  Patient Location: Home  Provider Location: Home Office  I discussed the limitations of evaluation and management by telemedicine. The patient expressed understanding and agreed to proceed.  Vital Signs: Because this visit was a virtual/telehealth visit, some criteria may be missing or patient reported. Any vitals not documented were not able to be obtained and vitals that have been documented are patient reported.  VideoDeclined- This patient declined Librarian, academic. Therefore the visit was completed with audio only.  Persons Participating in Visit: Patient.  AWV Questionnaire: No: Patient Medicare AWV questionnaire was not completed prior to this visit.  Cardiac Risk Factors include: advanced age (>96men, >39 women);diabetes mellitus;dyslipidemia;hypertension     Objective:    Today's Vitals   10/15/23 1312  BP: 127/73  Pulse: 93  Weight: 153 lb (69.4 kg)  Height: 5' 2 (1.575 m)   Body mass index is 27.98 kg/m.     10/15/2023    1:07 PM 06/20/2023   12:42 AM 10/12/2022    7:56 AM 10/10/2022    9:07 AM 07/27/2022    7:08 AM 07/24/2022    3:16 PM 07/13/2022   10:15 AM  Advanced Directives  Does Patient Have a Medical Advance Directive? No No No No No No No  Would patient like information on creating a medical advance directive?   No - Patient declined No - Patient declined  No - Patient declined No - Patient declined     Current Medications (verified) Outpatient Encounter Medications as of 10/15/2023  Medication Sig   acetaminophen  (TYLENOL ) 650 MG CR tablet Take 650 mg by mouth every 8 (eight) hours as needed for pain.   ALPRAZolam  (XANAX ) 0.5 MG tablet Take 1 tablet (0.5 mg total) by mouth daily as needed.   Baclofen  5 MG TABS Take 1 tablet (5 mg total) by mouth 3 (three) times daily as needed.   busPIRone  (BUSPAR ) 5 MG tablet Take 5 mg by mouth 3 (three) times daily.   diclofenac  (VOLTAREN ) 75 MG EC tablet Take 1 tablet (75 mg total) by mouth 2 (two) times daily.   hydroxychloroquine  (PLAQUENIL ) 200 MG tablet TAKE ONE (1) TABLET BY MOUTH EVERY DAY   isosorbide  mononitrate (IMDUR ) 60 MG 24 hr tablet Take 60 mg by mouth daily.   lactulose  (CHRONULAC ) 10 GM/15ML solution Take 15 mLs (10 g total) by mouth daily as needed for mild constipation or moderate constipation.   levothyroxine  (SYNTHROID ) 50 MCG tablet TAKE ONE (1) TABLET EACH DAY   linaclotide  (LINZESS ) 290 MCG CAPS capsule Take 1 capsule (290 mcg total) by mouth daily before breakfast.   lisinopril  (ZESTRIL ) 10 MG tablet TAKE ONE (1) TABLET EACH DAY   meclizine  (ANTIVERT ) 12.5 MG tablet Take 1 tablet (12.5 mg total) by mouth 3 (three) times daily as needed for dizziness (Vertigo).   metoprolol  tartrate (LOPRESSOR ) 50 MG tablet Take 1 tablet (50 mg total) by mouth 2 (two) times daily.   nitroGLYCERIN  (NITROSTAT ) 0.4 MG SL tablet DISSOLVE 1  TAB UNDER TOUNGE FOR CHEST PAIN. MAY REPEAT EVERY 5 MINUTES FOR 3 DOSES. IF NO RELIEF CALL 911 OR GO TO ER   pantoprazole  (PROTONIX ) 40 MG tablet Take 1 tablet (40 mg total) by mouth 2 (two) times daily.   polyethylene glycol (MIRALAX  / GLYCOLAX ) 17 g packet Take 17 g by mouth daily.   pravastatin  (PRAVACHOL ) 80 MG tablet Take 1 tablet (80 mg total) by mouth every evening.   Semaglutide  (RYBELSUS ) 7 MG TABS Take 1 tablet (7 mg total) by mouth daily.   traMADol  (ULTRAM ) 50 MG tablet Take 1 tablet (50 mg total) by  mouth every 8 (eight) hours as needed.   Wheat Dextrin (BENEFIBER PO) Take 1 Dose by mouth daily.   No facility-administered encounter medications on file as of 10/15/2023.    Allergies (verified) Fentanyl , Promethazine  hcl, Jardiance  [empagliflozin ], Metformin  and related, and Farxiga  [dapagliflozin ]   History: Past Medical History:  Diagnosis Date   A-fib (HCC)    Anal fissure    resolved    Anemia    Back pain    with left radiculopathy   CAD (coronary artery disease)    Cataract    Collagen vascular disease (HCC)    DDD (degenerative disc disease)    Depression    Diabetes mellitus    Diverticulosis    Dyslipidemia    Dysrhythmia    AFib   Esophagitis    GERD (gastroesophageal reflux disease)    Hiatal hernia    Hx of peptic ulcer    Hyperlipidemia    Hypertension    Hypothyroidism    Internal hemorrhoids 2017   NSTEMI (non-ST elevated myocardial infarction) (HCC) 08/05/2015   Osteopenia    Sleep apnea    not using CPAP now, could not tolerate and PCP aware.   Thyroid  disease    Past Surgical History:  Procedure Laterality Date   ABDOMINAL HYSTERECTOMY Bilateral 2001 - approximate   APPENDECTOMY     BREAST REDUCTION SURGERY     CARDIAC CATHETERIZATION N/A 08/08/2015   Procedure: Left Heart Cath and Coronary Angiography;  Surgeon: Lonni JONETTA Cash, MD;  Location: [facility];  Service: Cardiovascular;  Laterality: N/A;   CATARACT EXTRACTION Bilateral    CHOLECYSTECTOMY  2008 - approximate   COLONOSCOPY  06/2007   Friable anal canal and anal papillae. Pancolonic diverticula. Normal terminal ileum. Status post segmental biopsy, descending colon biopsy showed minimal cryptitis. Biopsies felt to be  nonspecific but could be seen with NSAIDs. No features of inflammatory bowel disease. Stool studies were negative.   COLONOSCOPY  03/21/2011   RMR: colonic polyps treated as described above, colonic diverticulosis (Tubular adenomas)   COLONOSCOPY WITH  PROPOFOL  N/A 06/17/2014   RMR: Colonic diverticulosis. Multiple colonic polyps removed as described above.    COLONOSCOPY WITH PROPOFOL  N/A 03/19/2016   Procedure: COLONOSCOPY WITH PROPOFOL ;  Surgeon: Lamar CHRISTELLA Hollingshead, MD;  [facility];  Service: Endoscopy;  Laterality: N/A;  8:45am   COLONOSCOPY WITH PROPOFOL  N/A 10/02/2018   Procedure: COLONOSCOPY WITH PROPOFOL ;  Surgeon: Hollingshead Lamar CHRISTELLA, MD;  Location: [facility];  Service: Endoscopy;  Laterality: N/A;  9:15am   CORONARY ANGIOPLASTY WITH STENT PLACEMENT     4 stents   ESOPHAGEAL DILATION N/A 06/17/2014   Procedure: ESOPHAGEAL DILATION;  Surgeon: Lamar CHRISTELLA Hollingshead, MD;  Location: [facility];  Service: Endoscopy;  Laterality: N/AMERL Stai 54 Fr   ESOPHAGOGASTRODUODENOSCOPY  06/2007   Small hiatal hernia   ESOPHAGOGASTRODUODENOSCOPY  03/21/2011  RMR: normal esophagus- status post passage of Maloney dilator. Small hiatal hernia. Trivial antral and bulbar erosions   ESOPHAGOGASTRODUODENOSCOPY (EGD) WITH PROPOFOL  N/A 06/17/2014   RMR: Small hiatal hernia; otherwise normal EGD. Status post passage of a Maloney dilator. No explanation for patients right upper quadrant abdominal pain.    ESOPHAGOGASTRODUODENOSCOPY (EGD) WITH PROPOFOL  N/A 03/19/2016   Procedure: ESOPHAGOGASTRODUODENOSCOPY (EGD) WITH PROPOFOL ;  Surgeon: Lamar CHRISTELLA Hollingshead, MD;  [facility];  Service: Endoscopy;  Laterality: N/A;   ESOPHAGOGASTRODUODENOSCOPY (EGD) WITH PROPOFOL  N/A 10/12/2022   Procedure: ESOPHAGOGASTRODUODENOSCOPY (EGD) WITH PROPOFOL ;  Surgeon: Hollingshead Lamar CHRISTELLA, MD;  [facility];  Service: Endoscopy;  Laterality: N/A;   FLEXIBLE SIGMOIDOSCOPY N/A 10/12/2022   Procedure: FLEXIBLE SIGMOIDOSCOPY;  Surgeon: Hollingshead Lamar CHRISTELLA, MD;  [facility];  Service: Endoscopy;  Laterality: N/A;   KNEE SURGERY Left    arthroscopy   LIPOMA EXCISION  03/17/2012   Procedure: EXCISION LIPOMA;  Surgeon: Lynwood MALVA Pina, MD;  Location: [facility];  Service: General;  Laterality: Right;  excision of right lower back lipoma   MALONEY DILATION N/A 03/19/2016   Procedure: AGAPITO DILATION;  Surgeon: Lamar CHRISTELLA Hollingshead, MD;  [facility];  Service: Endoscopy;  Laterality: N/A;   MALONEY DILATION N/A 10/12/2022   Procedure: AGAPITO DILATION;  Surgeon: Hollingshead Lamar CHRISTELLA, MD;  [facility];  Service: Endoscopy;  Laterality: N/A;   PATELLA FRACTURE SURGERY Right    POLYPECTOMY  06/17/2014   Procedure: POLYPECTOMY;  Surgeon: Lamar CHRISTELLA Hollingshead, MD;  Location: [facility];  Service: Endoscopy;;   POLYPECTOMY  10/02/2018   Procedure: POLYPECTOMY;  Surgeon: Hollingshead Lamar CHRISTELLA, MD;  [facility];  Service: Endoscopy;;  colon   REDUCTION MAMMAPLASTY Bilateral    Family History  Problem Relation Age of Onset   Heart attack Mother    Hypertension Mother    Stroke Mother        Deceased, age 15   Diabetes Sister    Arthritis Sister    Diabetes Sister    Cancer Sister        ovarian   Stroke Sister    Alzheimer's disease Sister    Diabetes Brother        also has a blood disorder    Clotting disorder Brother    Kidney disease Brother    Heart disease Brother    Melanoma Brother        deceased   Diabetes Son    Hypertension Son    Coronary artery disease Son 71       CABG   Stroke Son    Hypertension Son    Coronary artery disease Son 26       CABG   Colon cancer Neg Hx    Breast cancer Neg Hx    Social History   Socioeconomic History   Marital status: Married    Spouse name: Not on file   Number of children: 4   Years of education: 6   Highest education level: 6th grade  Occupational History   Occupation: Retired    Comment: Engineer, site  Tobacco Use   Smoking status: Never   Smokeless tobacco: Never  Vaping Use   Vaping status: Never Used  Substance and Sexual Activity   Alcohol use: Not Currently    Alcohol/week: 1.0 standard drink of alcohol    Types: 1 Glasses of wine per week   Drug use:  No  Sexual activity: Not Currently    Birth control/protection: Post-menopausal  Other Topics Concern   Not on file  Social History Narrative    Has 4 adult children. She lives home with one of her sons who recently had a stroke and needs 24 hour care. She is the primary caregiver and is now legally separated from her husband.    Social Drivers of Corporate investment banker Strain: Low Risk  (10/15/2023)   Overall Financial Resource Strain (CARDIA)    Difficulty of Paying Living Expenses: Not hard at all  Food Insecurity: No Food Insecurity (10/15/2023)   Hunger Vital Sign    Worried About Running Out of Food in the Last Year: Never true    Ran Out of Food in the Last Year: Never true  Transportation Needs: No Transportation Needs (10/15/2023)   PRAPARE - Administrator, Civil Service (Medical): No    Lack of Transportation (Non-Medical): No  Physical Activity: Insufficiently Active (10/15/2023)   Exercise Vital Sign    Days of Exercise per Week: 4 days    Minutes of Exercise per Session: 10 min  Stress: No Stress Concern Present (10/15/2023)   [name] of Occupational Health - Occupational Stress Questionnaire    Feeling of Stress: Not at all  Social Connections: Moderately Isolated (10/15/2023)   Social Connection and Isolation Panel    Frequency of Communication with Friends and Family: More than three times a week    Frequency of Social Gatherings with Friends and Family: More than three times a week    Attends Religious Services: More than 4 times per year    Active Member of [employer] or Organizations: No    Attends Banker Meetings: Never    Marital Status: Widowed    Tobacco Counseling Counseling given: Yes    Clinical Intake:  Pre-visit preparation completed: Yes  Pain : No/denies pain     BMI - recorded: 27.89 Nutritional Status: BMI 25 -29 Overweight Nutritional Risks: None Diabetes: Yes CBG done?: No  Lab Results  Component  Value Date   HGBA1C 6.6 (H) 07/18/2023   HGBA1C 6.1 (H) 03/18/2023   HGBA1C 7.4 (H) 12/14/2022     How often do you need to have someone help you when you read instructions, pamphlets, or other written materials from your doctor or pharmacy?: 1 - Never  Interpreter Needed?: No  Information entered by :: alia t/cma   Activities of Daily Living     10/15/2023    1:06 PM  In your present state of health, do you have any difficulty performing the following activities:  Hearing? 0  Vision? 0  Difficulty concentrating or making decisions? 0  Walking or climbing stairs? 1  Comment sometimes  Dressing or bathing? 0  Doing errands, shopping? 0  Preparing Food and eating ? N  Using the Toilet? N  In the past six months, have you accidently leaked urine? N  Do you have problems with loss of bowel control? N  Managing your Medications? N  Managing your Finances? N  Housekeeping or managing your Housekeeping? N    Patient Care Team: Lavell Bari LABOR, FNP as PCP - General (Nurse Practitioner) Lavona Agent, MD as PCP - Cardiology (Cardiology) Shaaron Lamar HERO, MD (Gastroenterology) Lavona Agent, MD as Consulting Physician (Cardiology) Albina Maffucci, AUD as Audiologist (Audiology) Nivia Sally Alice, OD as Consulting Physician (Optometry)  I have updated your Care Teams any recent Medical Services you may have received from  other providers in the past year.     Assessment:   This is a routine wellness examination for Kyisha.  Hearing/Vision screen Hearing Screening - Comments:: Pt denies hearing dif Vision Screening - Comments:: Pt denies vision dif/pt goes to [facility] in [place], [place]   Goals Addressed             This Visit's Progress    Exercise 3x per week (30 min per time)   On track    Exercising at the [facility] with [employer] classes or walking are great options.       Depression Screen     10/15/2023    1:09 PM 08/01/2023   11:04 AM 06/17/2023   10:10 AM 03/18/2023    10:26 AM 12/14/2022   10:09 AM 08/14/2022   11:37 AM 07/24/2022    3:15 PM  PHQ 2/9 Scores  PHQ - 2 Score 0 0 0 0 0 0 0  PHQ- 9 Score   4 6 4 9      Fall Risk     10/15/2023    1:04 PM 08/01/2023   11:04 AM 03/18/2023   10:26 AM 09/13/2022    9:54 AM 08/14/2022   11:38 AM  Fall Risk   Falls in the past year? 1 0 0 1 1  Number falls in past yr: 0 0 0 0 0  Injury with Fall? 0 0 0 1 0  Risk for fall due to : No Fall Risks Impaired balance/gait No Fall Risks Impaired balance/gait Impaired balance/gait  Follow up Falls evaluation completed Falls evaluation completed Falls evaluation completed;Education provided Falls evaluation completed;Education provided Falls evaluation completed    MEDICARE RISK AT HOME:  Medicare Risk at Home Any stairs in or around the home?: No If so, are there any without handrails?: No Home free of loose throw rugs in walkways, pet beds, electrical cords, etc?: Yes Adequate lighting in your home to reduce risk of falls?: Yes Life alert?: No Use of a cane, walker or w/c?: No Grab bars in the bathroom?: Yes Shower chair or bench in shower?: Yes Elevated toilet seat or a handicapped toilet?: No  TIMED UP AND GO:  Was the test performed?  no  Cognitive Function: 6CIT completed    03/27/2018   10:49 AM 03/25/2017   10:23 AM 03/21/2016   10:25 AM 03/21/2015    2:23 PM  MMSE - Mini Mental State Exam  Orientation to time 5 5  5  3    Orientation to Place 5 5  5  5    Registration 3 3  3  3    Attention/ Calculation 2 2  2  2    Recall 3 2  2  3    Language- name 2 objects 2 2  2  2    Language- repeat 1 1 1 1   Language- follow 3 step command 3 3  3  3    Language- read & follow direction 1 0  1  1   Write a sentence 1 1  1  1    Copy design 1 0  1  0   Total score 27 24  26  24       Data saved with a previous flowsheet row definition        10/15/2023    1:11 PM 07/24/2022    3:16 PM 05/21/2019    9:36 AM  6CIT Screen  What Year? 0 points 0 points 0 points   What month? 0 points 0 points 0  points  What time? 0 points 0 points 0 points  Count back from 20 0 points 0 points 0 points  Months in reverse 4 points 0 points 4 points  Repeat phrase 4 points 0 points 2 points  Total Score 8 points 0 points 6 points    Immunizations Immunization History  Administered Date(s) Administered   Fluad Quad(high Dose 65+) 12/19/2018, 03/23/2020, 02/22/2021, 03/20/2022   Fluad Trivalent(High Dose 65+) 01/08/2023   Influenza Split 01/13/2015, 12/31/2016   Influenza, High Dose Seasonal PF 02/07/2015, 01/21/2017, 02/12/2018   Influenza,inj,Quad PF,6+ Mos 01/19/2013, 05/20/2014, 01/10/2016   Moderna Sars-Covid-2 Vaccination 07/02/2019, 08/04/2019   Pneumococcal Conjugate-13 08/13/2013   Pneumococcal Polysaccharide-23 02/07/2015   Tdap 12/18/2012, 09/23/2023   Zoster Recombinant(Shingrix ) 06/13/2021, 12/18/2021    Screening Tests Health Maintenance  Topic Date Due   COVID-19 Vaccine (3 - Moderna risk series) 09/01/2019   INFLUENZA VACCINE  11/08/2023   FOOT EXAM  01/08/2024   HEMOGLOBIN A1C  01/17/2024   MAMMOGRAM  06/16/2024   Diabetic kidney evaluation - eGFR measurement  07/17/2024   Diabetic kidney evaluation - Urine ACR  07/17/2024   OPHTHALMOLOGY EXAM  07/30/2024   Medicare Annual Wellness (AWV)  10/14/2024   DEXA SCAN  09/26/2026   DTaP/Tdap/Td (3 - Td or Tdap) 09/22/2033   Pneumococcal Vaccine: 50+ Years  Completed   Zoster Vaccines- Shingrix   Completed   Hepatitis B Vaccines  Aged Out   HPV VACCINES  Aged Out   Meningococcal B Vaccine  Aged Out   Colonoscopy  Discontinued   Hepatitis C Screening  Discontinued    Health Maintenance  Health Maintenance Due  Topic Date Due   COVID-19 Vaccine (3 - Moderna risk series) 09/01/2019   Health Maintenance Items Addressed: See Nurse Notes at the end of this note  Additional Screening:  Vision Screening: Recommended annual ophthalmology exams for early detection of glaucoma and other  disorders of the eye. Would you like a referral to an eye doctor? No    Dental Screening: Recommended annual dental exams for proper oral hygiene  Community Resource Referral / Chronic Care Management: CRR required this visit?  No   CCM required this visit?  No   Plan:    I have personally reviewed and noted the following in the patient's chart:   Medical and social history Use of alcohol, tobacco or illicit drugs  Current medications and supplements including opioid prescriptions. Patient is not currently taking opioid prescriptions. Functional ability and status Nutritional status Physical activity Advanced directives List of other physicians Hospitalizations, surgeries, and ER visits in previous 12 months Vitals Screenings to include cognitive, depression, and falls Referrals and appointments  In addition, I have reviewed and discussed with patient certain preventive protocols, quality metrics, and best practice recommendations. A written personalized care plan for preventive services as well as general preventive health recommendations were provided to patient.   Ozie Ned, CMA   10/15/2023   After Visit Summary: (MyChart) Due to this being a telephonic visit, the after visit summary with patients personalized plan was offered to patient via MyChart   Notes: Nothing significant to report at this time.

## 2023-10-18 ENCOUNTER — Encounter: Payer: Self-pay | Admitting: Family

## 2023-10-18 ENCOUNTER — Other Ambulatory Visit: Payer: Self-pay | Admitting: Family

## 2023-10-18 ENCOUNTER — Ambulatory Visit (INDEPENDENT_AMBULATORY_CARE_PROVIDER_SITE_OTHER): Admitting: Family

## 2023-10-18 VITALS — BP 105/67 | HR 81 | Temp 98.0°F | Ht 62.0 in | Wt 155.0 lb

## 2023-10-18 DIAGNOSIS — E1169 Type 2 diabetes mellitus with other specified complication: Secondary | ICD-10-CM

## 2023-10-18 DIAGNOSIS — E039 Hypothyroidism, unspecified: Secondary | ICD-10-CM

## 2023-10-18 DIAGNOSIS — E559 Vitamin D deficiency, unspecified: Secondary | ICD-10-CM

## 2023-10-18 DIAGNOSIS — M542 Cervicalgia: Secondary | ICD-10-CM

## 2023-10-18 DIAGNOSIS — I480 Paroxysmal atrial fibrillation: Secondary | ICD-10-CM

## 2023-10-18 DIAGNOSIS — F411 Generalized anxiety disorder: Secondary | ICD-10-CM | POA: Diagnosis not present

## 2023-10-18 DIAGNOSIS — F331 Major depressive disorder, recurrent, moderate: Secondary | ICD-10-CM

## 2023-10-18 DIAGNOSIS — M549 Dorsalgia, unspecified: Secondary | ICD-10-CM

## 2023-10-18 DIAGNOSIS — M1990 Unspecified osteoarthritis, unspecified site: Secondary | ICD-10-CM | POA: Diagnosis not present

## 2023-10-18 DIAGNOSIS — K219 Gastro-esophageal reflux disease without esophagitis: Secondary | ICD-10-CM

## 2023-10-18 DIAGNOSIS — K5900 Constipation, unspecified: Secondary | ICD-10-CM

## 2023-10-18 DIAGNOSIS — I25110 Atherosclerotic heart disease of native coronary artery with unstable angina pectoris: Secondary | ICD-10-CM

## 2023-10-18 DIAGNOSIS — K581 Irritable bowel syndrome with constipation: Secondary | ICD-10-CM

## 2023-10-18 DIAGNOSIS — M170 Bilateral primary osteoarthritis of knee: Secondary | ICD-10-CM

## 2023-10-18 DIAGNOSIS — E1159 Type 2 diabetes mellitus with other circulatory complications: Secondary | ICD-10-CM

## 2023-10-18 DIAGNOSIS — I152 Hypertension secondary to endocrine disorders: Secondary | ICD-10-CM

## 2023-10-18 LAB — BAYER DCA HB A1C WAIVED: HB A1C (BAYER DCA - WAIVED): 7.3 % — ABNORMAL HIGH (ref 4.8–5.6)

## 2023-10-18 MED ORDER — METOPROLOL TARTRATE 50 MG PO TABS: 50.0000 mg | ORAL_TABLET | Freq: Two times a day (BID) | ORAL | 0 refills | Status: DC

## 2023-10-18 MED ORDER — ALPRAZOLAM 0.5 MG PO TABS: 0.5000 mg | ORAL_TABLET | Freq: Every day | ORAL | 2 refills | Status: DC | PRN

## 2023-10-18 MED ORDER — LACTULOSE 10 GM/15ML PO SOLN: 10.0000 g | Freq: Every day | ORAL | 0 refills | Status: DC | PRN

## 2023-10-18 MED ORDER — BACLOFEN 5 MG PO TABS: 5.0000 mg | ORAL_TABLET | Freq: Three times a day (TID) | ORAL | 0 refills | Status: DC | PRN

## 2023-10-18 MED ORDER — LINACLOTIDE 290 MCG PO CAPS: 290.0000 ug | ORAL_CAPSULE | Freq: Every day | ORAL | 1 refills | Status: DC

## 2023-10-18 MED ORDER — TRAMADOL HCL 50 MG PO TABS: 50.0000 mg | ORAL_TABLET | Freq: Three times a day (TID) | ORAL | 2 refills | Status: DC | PRN

## 2023-10-18 MED ORDER — PRAVASTATIN SODIUM 80 MG PO TABS: 80.0000 mg | ORAL_TABLET | Freq: Every evening | ORAL | 0 refills | Status: DC

## 2023-10-18 MED ORDER — PANTOPRAZOLE SODIUM 40 MG PO TBEC: 40.0000 mg | DELAYED_RELEASE_TABLET | Freq: Two times a day (BID) | ORAL | 0 refills | Status: DC

## 2023-10-18 NOTE — Progress Notes (Signed)
 Subjective:    Patient ID: Rhonda Garcia Phlegm, female    DOB: 15-Apr-1940, 83 y.o.   MRN: [id number]  Chief Complaint  Patient presents with   Medical Management of Chronic Issues   PT presents to the office today for chronic follow up.   She is followed by Cardiologists every other year for A Fib, hx NSTEMI, and CAD. Takes Eliquis .    She is followed by GI as needed for constipation, GERD.   She had elevated rheumatoid arthritis and saw a rheumatologists. She states her pain is a 9 out 10.    Her husband passed away May 28, 2023. Since grieving this.  Hypertension This is a chronic problem. The current episode started more than 1 year ago. The problem has been resolved since onset. The problem is controlled. Associated symptoms include anxiety and malaise/fatigue (some times). Pertinent negatives include no blurred vision, peripheral edema or shortness of breath. Risk factors for coronary artery disease include dyslipidemia, diabetes mellitus, obesity and sedentary lifestyle. The current treatment provides moderate improvement. Identifiable causes of hypertension include a thyroid  problem.  Gastroesophageal Reflux She complains of belching and heartburn. She reports no hoarse voice. This is a chronic problem. The current episode started more than 1 year ago. The problem occurs occasionally. The symptoms are aggravated by certain foods. Associated symptoms include fatigue. She has tried a PPI for the symptoms. The treatment provided moderate relief.  Diabetes She presents for her follow-up diabetic visit. She has type 2 diabetes mellitus. Hypoglycemia symptoms include nervousness/anxiousness. Associated symptoms include fatigue and foot paresthesias. Pertinent negatives for diabetes include no blurred vision. Symptoms are stable. Diabetic complications include peripheral neuropathy. Risk factors for coronary artery disease include dyslipidemia, diabetes mellitus, hypertension, sedentary lifestyle  and post-menopausal. She is following a generally healthy diet. Her overall blood glucose range is 140-180 mg/dl. (Does not check glucose regularly )  Thyroid  Problem Presents for follow-up visit. Symptoms include anxiety, constipation, depressed mood and fatigue. Patient reports no diarrhea or hoarse voice. The symptoms have been stable.  Hyperlipidemia This is a chronic problem. The current episode started more than 1 year ago. The problem is controlled. Recent lipid tests were reviewed and are normal. Exacerbating diseases include obesity. Pertinent negatives include no shortness of breath. Current antihyperlipidemic treatment includes statins. The current treatment provides moderate improvement of lipids. Risk factors for coronary artery disease include dyslipidemia, diabetes mellitus, hypertension, a sedentary lifestyle and post-menopausal.  Arthritis Presents for follow-up visit. She complains of pain and stiffness. The symptoms have been stable. Affected locations include the left knee, right knee and right elbow. Her pain is at a severity of 9/10. Associated symptoms include fatigue. Pertinent negatives include no diarrhea.  Anxiety Presents for follow-up visit. Symptoms include depressed mood, excessive worry, insomnia, irritability, nervous/anxious behavior and restlessness. Patient reports no shortness of breath. Symptoms occur occasionally. The severity of symptoms is moderate.    Depression        This is a chronic problem.  The current episode started more than 1 year ago.   The problem occurs intermittently.  Associated symptoms include fatigue, insomnia, restlessness and sad.  Associated symptoms include no helplessness and no hopelessness.  Past medical history includes thyroid  problem and anxiety.   Constipation This is a chronic problem. The current episode started more than 1 year ago. The problem has been waxing and waning since onset. Her stool frequency is 2 to 3 times per week.  Pertinent negatives include no diarrhea. She has tried diet changes,  stool softeners, fiber and laxatives (linzess ) for the symptoms. The treatment provided mild relief.    Current opioids rx- Ultram  50 mg # meds rx- 20 Effectiveness of current meds-stable Adverse reactions from pain meds-constipation Morphine  equivalent- 15  Pill count performed-No Last drug screen - 10/18/23 ( high risk q43m, moderate risk q38m, low risk yearly ) Urine drug screen today- Yes Was the [facility] reviewed- yes  If yes were their any concerning findings? - none Pain contract signed on: 10/18/23    Review of Systems  Constitutional:  Positive for fatigue, irritability and malaise/fatigue (some times).  HENT:  Negative for hoarse voice.   Eyes:  Negative for blurred vision.  Respiratory:  Negative for shortness of breath.   Gastrointestinal:  Positive for constipation and heartburn. Negative for diarrhea.  Musculoskeletal:  Positive for stiffness.  Psychiatric/Behavioral:  The patient is nervous/anxious and has insomnia.   All other systems reviewed and are negative.  Family History  Problem Relation Age of Onset   Heart attack Mother    Hypertension Mother    Stroke Mother        Deceased, age 57   Diabetes Sister    Arthritis Sister    Diabetes Sister    Cancer Sister        ovarian   Stroke Sister    Alzheimer's disease Sister    Diabetes Brother        also has a blood disorder    Clotting disorder Brother    Kidney disease Brother    Heart disease Brother    Melanoma Brother        deceased   Diabetes Son    Hypertension Son    Coronary artery disease Son 3       CABG   Stroke Son    Hypertension Son    Coronary artery disease Son 40       CABG   Colon cancer Neg Hx    Breast cancer Neg Hx    Social History   Socioeconomic History   Marital status: Married    Spouse name: Not on file   Number of children: 4   Years of education: 6   Highest education level: 6th grade   Occupational History   Occupation: Retired    Comment: Engineer, site  Tobacco Use   Smoking status: Never   Smokeless tobacco: Never  Vaping Use   Vaping status: Never Used  Substance and Sexual Activity   Alcohol use: Not Currently    Alcohol/week: 1.0 standard drink of alcohol    Types: 1 Glasses of wine per week   Drug use: No   Sexual activity: Not Currently    Birth control/protection: Post-menopausal  Other Topics Concern   Not on file  Social History Narrative    Has 4 adult children. She lives home with one of her sons who recently had a stroke and needs 24 hour care. She is the primary caregiver and is now legally separated from her husband.    Social Drivers of Corporate investment banker Strain: Low Risk  (10/15/2023)   Overall Financial Resource Strain (CARDIA)    Difficulty of Paying Living Expenses: Not hard at all  Food Insecurity: No Food Insecurity (10/15/2023)   Hunger Vital Sign    Worried About Running Out of Food in the Last Year: Never true    Ran Out of Food in the Last Year: Never true  Transportation Needs: No Transportation Needs (10/15/2023)  PRAPARE - Administrator, Civil Service (Medical): No    Lack of Transportation (Non-Medical): No  Physical Activity: Insufficiently Active (10/15/2023)   Exercise Vital Sign    Days of Exercise per Week: 4 days    Minutes of Exercise per Session: 10 min  Stress: No Stress Concern Present (10/15/2023)   [name] of Occupational Health - Occupational Stress Questionnaire    Feeling of Stress: Not at all  Social Connections: Moderately Isolated (10/15/2023)   Social Connection and Isolation Panel    Frequency of Communication with Friends and Family: More than three times a week    Frequency of Social Gatherings with Friends and Family: More than three times a week    Attends Religious Services: More than 4 times per year    Active Member of [employer] or Organizations: No    Attends Tax inspector Meetings: Never    Marital Status: Widowed       Objective:   Physical Exam Vitals reviewed.  Constitutional:      General: She is not in acute distress.    Appearance: She is well-developed. She is obese.  HENT:     Head: Normocephalic and atraumatic.     Right Ear: Tympanic membrane normal.     Left Ear: Tympanic membrane normal.  Eyes:     Pupils: Pupils are equal, round, and reactive to light.  Neck:     Thyroid : No thyromegaly.  Cardiovascular:     Rate and Rhythm: Normal rate and regular rhythm.     Heart sounds: Normal heart sounds. No murmur heard. Pulmonary:     Effort: Pulmonary effort is normal. No respiratory distress.     Breath sounds: Normal breath sounds. No wheezing.  Abdominal:     General: Bowel sounds are normal. There is no distension.     Palpations: Abdomen is soft.     Tenderness: There is no abdominal tenderness.  Musculoskeletal:        General: Tenderness present.     Cervical back: Normal range of motion and neck supple.     Comments: Pain in left knee flexion and extension   Skin:    General: Skin is warm and dry.  Neurological:     Mental Status: She is alert and oriented to person, place, and time.     Cranial Nerves: No cranial nerve deficit.     Deep Tendon Reflexes: Reflexes are normal and symmetric.  Psychiatric:        Behavior: Behavior normal.        Thought Content: Thought content normal.        Judgment: Judgment normal.      BP 105/67   Pulse 81   Temp 98 F (36.7 C)   Ht 5' 2 (1.575 m)   Wt 155 lb (70.3 kg)   SpO2 97%   BMI 28.35 kg/m      Assessment & Plan:  AFREEN SIEBELS comes in today with chief complaint of Medical Management of Chronic Issues   Diagnosis and orders addressed: 1. GAD (generalized anxiety disorder) - ToxASSURE Select 13 (MW), Urine - ALPRAZolam  (XANAX ) 0.5 MG tablet; Take 1 tablet (0.5 mg total) by mouth daily as needed.  Dispense: 20 tablet; Refill: 2 -  CMP14+EGFR  2. Osteoarthritis, unspecified osteoarthritis type, unspecified site - ToxASSURE Select 13 (MW), Urine - Baclofen  5 MG TABS; Take 1 tablet (5 mg total) by mouth 3 (three) times daily as needed.  Dispense: 90  tablet; Refill: 0 - traMADol  (ULTRAM ) 50 MG tablet; Take 1 tablet (50 mg total) by mouth every 8 (eight) hours as needed.  Dispense: 20 tablet; Refill: 2 - CMP14+EGFR  3. Back pain with sciatica - Baclofen  5 MG TABS; Take 1 tablet (5 mg total) by mouth 3 (three) times daily as needed.  Dispense: 90 tablet; Refill: 0 - CMP14+EGFR  4. Type 2 diabetes mellitus with other specified complication, without long-term current use of insulin  (HCC) (Primary) - Bayer DCA Hb A1c Waived - CMP14+EGFR  5. Hypertension associated with diabetes (HCC) - metoprolol  tartrate (LOPRESSOR ) 50 MG tablet; Take 1 tablet (50 mg total) by mouth 2 (two) times daily.  Dispense: 180 tablet; Refill: 0 - CMP14+EGFR  6. Gastroesophageal reflux disease without esophagitis - pantoprazole  (PROTONIX ) 40 MG tablet; Take 1 tablet (40 mg total) by mouth 2 (two) times daily.  Dispense: 180 tablet; Refill: 0 - CMP14+EGFR  7. Hyperlipidemia associated with type 2 diabetes mellitus (HCC) - pravastatin  (PRAVACHOL ) 80 MG tablet; Take 1 tablet (80 mg total) by mouth every evening.  Dispense: 90 tablet; Refill: 0 - CMP14+EGFR  8. Bilateral primary osteoarthritis of knee - traMADol  (ULTRAM ) 50 MG tablet; Take 1 tablet (50 mg total) by mouth every 8 (eight) hours as needed.  Dispense: 20 tablet; Refill: 2 - CMP14+EGFR  9. Paroxysmal atrial fibrillation (HCC) - CMP14+EGFR  10. Moderate episode of recurrent major depressive disorder (HCC) - CMP14+EGFR  11. Coronary artery disease involving native coronary artery of native heart with unstable angina pectoris (HCC)  - CMP14+EGFR  12. Hypothyroidism, unspecified type - CMP14+EGFR  13. Constipation, unspecified constipation type  - lactulose  (CHRONULAC ) 10  GM/15ML solution; Take 15 mLs (10 g total) by mouth daily as needed for mild constipation or moderate constipation.  Dispense: 236 mL; Refill: 0 - linaclotide  (LINZESS ) 290 MCG CAPS capsule; Take 1 capsule (290 mcg total) by mouth daily before breakfast.  Dispense: 90 capsule; Refill: 1 - CMP14+EGFR  14. Neck pain - Baclofen  5 MG TABS; Take 1 tablet (5 mg total) by mouth 3 (three) times daily as needed.  Dispense: 90 tablet; Refill: 0 - CMP14+EGFR  15. Vitamin D  deficiency - CMP14+EGFR  16. Irritable bowel syndrome with constipation Start taking Linzess  290 mcg daily Lactulose  as needed Force fluids - lactulose  (CHRONULAC ) 10 GM/15ML solution; Take 15 mLs (10 g total) by mouth daily as needed for mild constipation or moderate constipation.  Dispense: 236 mL; Refill: 0 - linaclotide  (LINZESS ) 290 MCG CAPS capsule; Take 1 capsule (290 mcg total) by mouth daily before breakfast.  Dispense: 90 capsule; Refill: 1 - CMP14+EGFR   Start taking Linzess  290 mcg daily Lactulose  as needed Force fluids Patient reviewed in [place] controlled database, no flags noted. Contract and drug screen are up to date.  Continue current medications  Health Maintenance reviewed Diet and exercise encouraged  Follow up plan: 3 months  Bari Learn, FNP

## 2023-10-18 NOTE — Patient Instructions (Signed)

## 2023-10-19 LAB — CMP14+EGFR
ALT: 36 IU/L — ABNORMAL HIGH (ref 0–32)
AST: 33 IU/L (ref 0–40)
Albumin: 3.9 g/dL (ref 3.7–4.7)
Alkaline Phosphatase: 131 IU/L — ABNORMAL HIGH (ref 44–121)
BUN/Creatinine Ratio: 22 (ref 12–28)
BUN: 27 mg/dL (ref 8–27)
Bilirubin Total: 0.7 mg/dL (ref 0.0–1.2)
CO2: 24 mmol/L (ref 20–29)
Calcium: 9.3 mg/dL (ref 8.7–10.3)
Chloride: 103 mmol/L (ref 96–106)
Creatinine, Ser: 1.25 mg/dL — ABNORMAL HIGH (ref 0.57–1.00)
Globulin, Total: 2.4 g/dL (ref 1.5–4.5)
Glucose: 124 mg/dL — ABNORMAL HIGH (ref 70–99)
Potassium: 5.7 mmol/L — ABNORMAL HIGH (ref 3.5–5.2)
Sodium: 140 mmol/L (ref 134–144)
Total Protein: 6.3 g/dL (ref 6.0–8.5)
eGFR: 43 mL/min/1.73 — ABNORMAL LOW (ref >59)

## 2023-10-21 ENCOUNTER — Other Ambulatory Visit: Payer: Self-pay | Admitting: Family

## 2023-10-21 ENCOUNTER — Ambulatory Visit: Payer: Self-pay | Admitting: Family

## 2023-10-21 ENCOUNTER — Other Ambulatory Visit

## 2023-10-21 DIAGNOSIS — E875 Hyperkalemia: Secondary | ICD-10-CM

## 2023-10-22 ENCOUNTER — Other Ambulatory Visit

## 2023-10-22 DIAGNOSIS — E875 Hyperkalemia: Secondary | ICD-10-CM

## 2023-10-23 LAB — CMP14+EGFR
ALT: 19 IU/L (ref 0–32)
AST: 18 IU/L (ref 0–40)
Albumin: 3.8 g/dL (ref 3.7–4.7)
Alkaline Phosphatase: 128 IU/L — ABNORMAL HIGH (ref 44–121)
BUN/Creatinine Ratio: 17 (ref 12–28)
BUN: 20 mg/dL (ref 8–27)
Bilirubin Total: 0.6 mg/dL (ref 0.0–1.2)
CO2: 22 mmol/L (ref 20–29)
Calcium: 9.5 mg/dL (ref 8.7–10.3)
Chloride: 105 mmol/L (ref 96–106)
Creatinine, Ser: 1.19 mg/dL — ABNORMAL HIGH (ref 0.57–1.00)
Globulin, Total: 2 g/dL (ref 1.5–4.5)
Glucose: 138 mg/dL — ABNORMAL HIGH (ref 70–99)
Potassium: 5.2 mmol/L (ref 3.5–5.2)
Sodium: 141 mmol/L (ref 134–144)
Total Protein: 5.8 g/dL — ABNORMAL LOW (ref 6.0–8.5)
eGFR: 46 mL/min/1.73 — ABNORMAL LOW (ref >59)

## 2023-10-24 ENCOUNTER — Ambulatory Visit: Payer: Self-pay | Admitting: Family

## 2023-10-24 LAB — TOXASSURE SELECT 13 (MW), URINE

## 2023-11-21 ENCOUNTER — Other Ambulatory Visit: Payer: Self-pay | Admitting: Family

## 2023-11-21 ENCOUNTER — Telehealth: Payer: Self-pay

## 2023-11-21 MED ORDER — NITROGLYCERIN 0.4 MG SL SUBL: 0.4000 mg | SUBLINGUAL_TABLET | SUBLINGUAL | 1 refills | Status: AC | PRN

## 2023-11-21 NOTE — Telephone Encounter (Signed)
 Copied from CRM [id number]. Topic: Clinical - Prescription Issue >> Nov 21, 2023 11:49 AM Delon DASEN wrote: Reason for CRM: nitroGLYCERIN  (NITROSTAT ) 0.4 MG SL tablet- patient needs status of refill, she is completely out

## 2023-11-21 NOTE — Addendum Note (Signed)
 Addended by: LAVELL LYE A on: 11/21/2023 03:44 PM   Modules accepted: Orders

## 2023-11-21 NOTE — Telephone Encounter (Signed)
 FYI Only or Action Required?: Action required by provider: medication refill request.  Patient was last seen in primary care on 10/18/2023 by Lavell Bari LABOR, FNP.  Called Nurse Triage reporting No chief complaint on file..  Symptoms began today.  Interventions attempted: Nothing.  Symptoms are: stable.  Triage Disposition: No disposition on file.  Patient/caregiver understands and will follow disposition?:

## 2023-11-21 NOTE — Telephone Encounter (Unsigned)
 Copied from CRM #[id number]. Topic: Clinical - Medication Refill >> Nov 21, 2023 11:39 AM [name] wrote: Medication: nitroGLYCERIN  (NITROSTAT ) 0.4 MG SL tablet  Has the patient contacted their pharmacy? Yes (Agent: If no, request that the patient contact the pharmacy for the refill. If patient does not wish to contact the pharmacy document the reason why and proceed with request.) (Agent: If yes, when and what did the pharmacy advise?) No refills contact Dr.  This is the patient's preferred pharmacy:  [facility], [place] - [address] [place] [address] Phone: [phone] Fax: [phone]   Is this the correct pharmacy for this prescription? Yes If no, delete pharmacy and type the correct one.   Has the prescription been filled recently? No  Is the patient out of the medication? Yes  Has the patient been seen for an appointment in the last year OR does the patient have an upcoming appointment? Yes  Can we respond through MyChart? Yes  Agent: Please be advised that Rx refills may take up to 3 business days. We ask that you follow-up with your pharmacy.

## 2023-11-21 NOTE — Telephone Encounter (Signed)
 Prescription sent to pharmacy.

## 2023-11-22 ENCOUNTER — Ambulatory Visit (INDEPENDENT_AMBULATORY_CARE_PROVIDER_SITE_OTHER)

## 2023-11-22 ENCOUNTER — Ambulatory Visit: Admitting: Family Medicine

## 2023-11-22 ENCOUNTER — Encounter: Payer: Self-pay | Admitting: Family Medicine

## 2023-11-22 VITALS — BP 125/68 | HR 98 | Temp 97.8°F | Ht 62.0 in | Wt 159.0 lb

## 2023-11-22 DIAGNOSIS — K5909 Other constipation: Secondary | ICD-10-CM | POA: Diagnosis not present

## 2023-11-22 DIAGNOSIS — K5900 Constipation, unspecified: Secondary | ICD-10-CM | POA: Diagnosis not present

## 2023-11-22 DIAGNOSIS — R110 Nausea: Secondary | ICD-10-CM | POA: Diagnosis not present

## 2023-11-22 MED ORDER — NALOXEGOL OXALATE 12.5 MG PO TABS: 12.5000 mg | ORAL_TABLET | Freq: Every day | ORAL | 3 refills | Status: DC

## 2023-11-22 NOTE — Patient Instructions (Addendum)
 Trulance not covered but Movantik  might be so I sent that in to replace linzess  DRINK WATER . This is IMPORTANT for constipation Schedule a visit with Dr Shaaron ASAP.   Consider maybe getting checked out for gastroparesis Schedule with Bari in the next couple weeks to talk about replacing that Rybelsus  to something that doesn't affect the bowels.

## 2023-11-22 NOTE — Progress Notes (Signed)
 Subjective: RR:wjldzj, constipation PCP: Lavell Bari LABOR, FNP YEP:Wjnfpz Rhonda Garcia is a 83 y.o. female who is accompanied today's visit by her daughter.  She is presenting to clinic today for:  1.  Nausea, constipation Patient reports that she has had issues with chronic constipation for years.  Over the last week or so she has been having increasing nausea and abdominal discomfort.  Yesterday she had a good bowel movement but still feels full up into her rib cage.  She denies any blood in stool.  She has not vomited.  Sometimes though in the past she has vomited food that was not digested.  She is treated with Rybelsus  for type 2 diabetes with previous failures of Farxiga  and Jardiance .  Medical history significant for heart disease with stent in place.  She is also on chronic tramadol , alprazolam  as needed.  Does not use her baclofen  much.  She is tried and failed multiple modalities of treatment for chronic constipation including glycerin suppositories, lactulose  solution, MiraLAX , Linzess  to 90 and warm prune juice.  She admits that she does not hydrate well.  She was scheduled to have a colonoscopy about 6 months ago but did not fully clean out with the prep and so this was abandoned.  She has not followed up with gastroenterology since that time.   ROS: Per HPI  Allergies  Allergen Reactions   Fentanyl  Anaphylaxis   Promethazine  Hcl Anaphylaxis   Jardiance  [Empagliflozin ]     Vaginal burning/raw   Metformin  And Related     Leg pain   Farxiga  [Dapagliflozin ] Other (See Comments)    Recurrent yeast infections   Past Medical History:  Diagnosis Date   A-fib (HCC)    Anal fissure    resolved    Anemia    Back pain    with left radiculopathy   CAD (coronary artery disease)    Cataract    Collagen vascular disease (HCC)    DDD (degenerative disc disease)    Depression    Diabetes mellitus    Diverticulosis    Dyslipidemia    Dysrhythmia    AFib   Esophagitis    GERD  (gastroesophageal reflux disease)    Hiatal hernia    Hx of peptic ulcer    Hyperlipidemia    Hypertension    Hypothyroidism    Internal hemorrhoids 2017   NSTEMI (non-ST elevated myocardial infarction) (HCC) 08/05/2015   Osteopenia    Sleep apnea    not using CPAP now, could not tolerate and PCP aware.   Thyroid  disease     Current Outpatient Medications:    acetaminophen  (TYLENOL ) 650 MG CR tablet, Take 650 mg by mouth every 8 (eight) hours as needed for pain., Disp: , Rfl:    ALPRAZolam  (XANAX ) 0.5 MG tablet, Take 1 tablet (0.5 mg total) by mouth daily as needed., Disp: 20 tablet, Rfl: 2   Baclofen  5 MG TABS, Take 1 tablet (5 mg total) by mouth 3 (three) times daily as needed., Disp: 90 tablet, Rfl: 0   busPIRone  (BUSPAR ) 5 MG tablet, Take 5 mg by mouth 3 (three) times daily., Disp: , Rfl:    diclofenac  (VOLTAREN ) 75 MG EC tablet, Take 1 tablet (75 mg total) by mouth 2 (two) times daily., Disp: 60 tablet, Rfl: 2   hydroxychloroquine  (PLAQUENIL ) 200 MG tablet, TAKE ONE (1) TABLET BY MOUTH EVERY DAY, Disp: 30 tablet, Rfl: 1   isosorbide  mononitrate (IMDUR ) 60 MG 24 hr tablet, Take 60 mg by mouth  daily., Disp: , Rfl:    lactulose  (CHRONULAC ) 10 GM/15ML solution, Take 15 mLs (10 g total) by mouth daily as needed for mild constipation or moderate constipation., Disp: 236 mL, Rfl: 0   levothyroxine  (SYNTHROID ) 50 MCG tablet, TAKE ONE (1) TABLET EACH DAY, Disp: 90 tablet, Rfl: 3   linaclotide  (LINZESS ) 290 MCG CAPS capsule, Take 1 capsule (290 mcg total) by mouth daily before breakfast., Disp: 90 capsule, Rfl: 1   lisinopril  (ZESTRIL ) 10 MG tablet, TAKE ONE (1) TABLET EACH DAY, Disp: 90 tablet, Rfl: 0   meclizine  (ANTIVERT ) 12.5 MG tablet, Take 1 tablet (12.5 mg total) by mouth 3 (three) times daily as needed for dizziness (Vertigo)., Disp: 30 tablet, Rfl: 0   metoprolol  tartrate (LOPRESSOR ) 50 MG tablet, Take 1 tablet (50 mg total) by mouth 2 (two) times daily., Disp: 180 tablet, Rfl: 0    nitroGLYCERIN  (NITROSTAT ) 0.4 MG SL tablet, Place 1 tablet (0.4 mg total) under the tongue every 5 (five) minutes as needed for chest pain., Disp: 25 tablet, Rfl: 1   pantoprazole  (PROTONIX ) 40 MG tablet, Take 1 tablet (40 mg total) by mouth 2 (two) times daily., Disp: 180 tablet, Rfl: 0   polyethylene glycol (MIRALAX  / GLYCOLAX ) 17 g packet, Take 17 g by mouth daily., Disp: , Rfl:    pravastatin  (PRAVACHOL ) 80 MG tablet, Take 1 tablet (80 mg total) by mouth every evening., Disp: 90 tablet, Rfl: 0   Semaglutide  (RYBELSUS ) 7 MG TABS, Take 1 tablet (7 mg total) by mouth daily., Disp: 90 tablet, Rfl: 2   traMADol  (ULTRAM ) 50 MG tablet, Take 1 tablet (50 mg total) by mouth every 8 (eight) hours as needed., Disp: 20 tablet, Rfl: 2   Wheat Dextrin (BENEFIBER PO), Take 1 Dose by mouth daily., Disp: , Rfl:  Social History   Socioeconomic History   Marital status: Married    Spouse name: Not on file   Number of children: 4   Years of education: 6   Highest education level: 6th grade  Occupational History   Occupation: Retired    Comment: Engineer, site  Tobacco Use   Smoking status: Never   Smokeless tobacco: Never  Vaping Use   Vaping status: Never Used  Substance and Sexual Activity   Alcohol use: Not Currently    Alcohol/week: 1.0 standard drink of alcohol    Types: 1 Glasses of wine per week   Drug use: No   Sexual activity: Not Currently    Birth control/protection: Post-menopausal  Other Topics Concern   Not on file  Social History Narrative    Has 4 adult children. She lives home with one of her sons who recently had a stroke and needs 24 hour care. She is the primary caregiver and is now legally separated from her husband.    Social Drivers of Corporate investment banker Strain: Low Risk  (10/15/2023)   Overall Financial Resource Strain (CARDIA)    Difficulty of Paying Living Expenses: Not hard at all  Food Insecurity: No Food Insecurity (10/15/2023)   Hunger Vital Sign     Worried About Running Out of Food in the Last Year: Never true    Ran Out of Food in the Last Year: Never true  Transportation Needs: No Transportation Needs (10/15/2023)   PRAPARE - Administrator, Civil Service (Medical): No    Lack of Transportation (Non-Medical): No  Physical Activity: Insufficiently Active (10/15/2023)   Exercise Vital Sign    Days of  Exercise per Week: 4 days    Minutes of Exercise per Session: 10 min  Stress: No Stress Concern Present (10/15/2023)   [name] of Occupational Health - Occupational Stress Questionnaire    Feeling of Stress: Not at all  Social Connections: Moderately Isolated (10/15/2023)   Social Connection and Isolation Panel    Frequency of Communication with Friends and Family: More than three times a week    Frequency of Social Gatherings with Friends and Family: More than three times a week    Attends Religious Services: More than 4 times per year    Active Member of [employer] or Organizations: No    Attends Banker Meetings: Never    Marital Status: Widowed  Intimate Partner Violence: Not At Risk (10/15/2023)   Humiliation, Afraid, Rape, and Kick questionnaire    Fear of Current or Ex-Partner: No    Emotionally Abused: No    Physically Abused: No    Sexually Abused: No   Family History  Problem Relation Age of Onset   Heart attack Mother    Hypertension Mother    Stroke Mother        Deceased, age 69   Diabetes Sister    Arthritis Sister    Diabetes Sister    Cancer Sister        ovarian   Stroke Sister    Alzheimer's disease Sister    Diabetes Brother        also has a blood disorder    Clotting disorder Brother    Kidney disease Brother    Heart disease Brother    Melanoma Brother        deceased   Diabetes Son    Hypertension Son    Coronary artery disease Son 1       CABG   Stroke Son    Hypertension Son    Coronary artery disease Son 48       CABG   Colon cancer Neg Hx    Breast cancer Neg Hx      Objective: Office vital signs reviewed. BP 125/68   Pulse 98   Temp 97.8 Rhonda (36.6 C)   Ht 5' 2 (1.575 m)   Wt 159 lb (72.1 kg)   SpO2 95%   BMI 29.08 kg/m   Physical Examination:  General: Awake, alert, nontoxic female, No acute distress HEENT: sclera white, MMM GI: soft, she has mild tenderness in the upper abdomen and right lower quadrant of the abdomen.  She has no guarding or rebound, non-distended, bowel sounds present x4 but hypoactive, no hepatomegaly, no splenomegaly, no masses    DG Abd 1 View Result Date: 11/22/2023 CLINICAL DATA:  nausea/ constipation EXAM: ABDOMEN - 1 VIEW COMPARISON:  None Available. FINDINGS: Nonobstructive bowel gas pattern. Small volume fecal loading within the right colon. No pneumoperitoneum. No organomegaly or radiopaque calculi. No acute fracture or destructive lesion. Multilevel degenerative disc disease of the spine. IMPRESSION: Nonobstructive bowel gas pattern. Electronically Signed   By: Rogelia Myers M.D.   On: 11/22/2023 11:38    Assessment/ Plan: 83 y.o. female   Nausea - Plan: DG Abd 1 View  Chronic constipation - Plan: DG Abd 1 View, naloxegol  oxalate (MOVANTIK ) 12.5 MG TABS tablet  Trial of Movantik .  Discontinue Linzess .  Plain films showed some stool in the abdomen with a nonobstructive bowel gas pattern.  We discussed need for ongoing efforts to improve bowel movements.  I think that it may be worth her  being worked up for gastroparesis if she has not been yet.  I also discussed with her following up with PCP to discuss alternative diabetes medications that would not cause slowing of the gut and affect GI tract.  Maybe she would be a better candidate for something like Januvia  versus glipizide.  She did not seem very amenable to insulin  due to potential of weight gain.  I will CC both PCP and gastroenterology as RICK on this note for ongoing follow-up with the patient.   Rhonda CHRISTELLA Fielding, DO [facility] [phone]

## 2023-11-25 ENCOUNTER — Other Ambulatory Visit: Payer: Self-pay

## 2023-11-25 ENCOUNTER — Ambulatory Visit: Payer: Self-pay

## 2023-11-25 ENCOUNTER — Emergency Department (HOSPITAL_COMMUNITY)

## 2023-11-25 ENCOUNTER — Inpatient Hospital Stay (HOSPITAL_COMMUNITY)
Admission: EM | Admit: 2023-11-25 | Discharge: 2023-11-29 | DRG: 390 | Disposition: A | Discharge status: Home / Self Care | Attending: Internal Medicine | Admitting: Internal Medicine

## 2023-11-25 DIAGNOSIS — R1084 Generalized abdominal pain: Principal | ICD-10-CM

## 2023-11-25 DIAGNOSIS — K56609 Unspecified intestinal obstruction, unspecified as to partial versus complete obstruction: Secondary | ICD-10-CM | POA: Diagnosis not present

## 2023-11-25 DIAGNOSIS — E1169 Type 2 diabetes mellitus with other specified complication: Secondary | ICD-10-CM | POA: Diagnosis not present

## 2023-11-25 DIAGNOSIS — Z7985 Long-term (current) use of injectable non-insulin antidiabetic drugs: Secondary | ICD-10-CM

## 2023-11-25 DIAGNOSIS — Z955 Presence of coronary angioplasty implant and graft: Secondary | ICD-10-CM | POA: Diagnosis not present

## 2023-11-25 DIAGNOSIS — R748 Abnormal levels of other serum enzymes: Secondary | ICD-10-CM | POA: Diagnosis present

## 2023-11-25 DIAGNOSIS — Z79890 Hormone replacement therapy (postmenopausal): Secondary | ICD-10-CM | POA: Diagnosis not present

## 2023-11-25 DIAGNOSIS — I2583 Coronary atherosclerosis due to lipid rich plaque: Secondary | ICD-10-CM

## 2023-11-25 DIAGNOSIS — I10 Essential (primary) hypertension: Secondary | ICD-10-CM | POA: Diagnosis present

## 2023-11-25 DIAGNOSIS — Z888 Allergy status to other drugs, medicaments and biological substances status: Secondary | ICD-10-CM | POA: Diagnosis not present

## 2023-11-25 DIAGNOSIS — K560 Paralytic ileus: Secondary | ICD-10-CM | POA: Diagnosis not present

## 2023-11-25 DIAGNOSIS — M154 Erosive (osteo)arthritis: Secondary | ICD-10-CM | POA: Diagnosis present

## 2023-11-25 DIAGNOSIS — Z8673 Personal history of transient ischemic attack (TIA), and cerebral infarction without residual deficits: Secondary | ICD-10-CM | POA: Diagnosis not present

## 2023-11-25 DIAGNOSIS — K219 Gastro-esophageal reflux disease without esophagitis: Secondary | ICD-10-CM | POA: Diagnosis not present

## 2023-11-25 DIAGNOSIS — K869 Disease of pancreas, unspecified: Secondary | ICD-10-CM | POA: Diagnosis present

## 2023-11-25 DIAGNOSIS — I480 Paroxysmal atrial fibrillation: Secondary | ICD-10-CM | POA: Diagnosis not present

## 2023-11-25 DIAGNOSIS — Z841 Family history of disorders of kidney and ureter: Secondary | ICD-10-CM

## 2023-11-25 DIAGNOSIS — I252 Old myocardial infarction: Secondary | ICD-10-CM

## 2023-11-25 DIAGNOSIS — Z885 Allergy status to narcotic agent status: Secondary | ICD-10-CM | POA: Diagnosis not present

## 2023-11-25 DIAGNOSIS — E039 Hypothyroidism, unspecified: Secondary | ICD-10-CM | POA: Diagnosis present

## 2023-11-25 DIAGNOSIS — R1111 Vomiting without nausea: Secondary | ICD-10-CM | POA: Diagnosis not present

## 2023-11-25 DIAGNOSIS — Z860101 Personal history of adenomatous and serrated colon polyps: Secondary | ICD-10-CM

## 2023-11-25 DIAGNOSIS — K5730 Diverticulosis of large intestine without perforation or abscess without bleeding: Secondary | ICD-10-CM | POA: Diagnosis not present

## 2023-11-25 DIAGNOSIS — Z79899 Other long term (current) drug therapy: Secondary | ICD-10-CM | POA: Diagnosis not present

## 2023-11-25 DIAGNOSIS — F331 Major depressive disorder, recurrent, moderate: Secondary | ICD-10-CM | POA: Diagnosis not present

## 2023-11-25 DIAGNOSIS — E785 Hyperlipidemia, unspecified: Secondary | ICD-10-CM | POA: Diagnosis not present

## 2023-11-25 DIAGNOSIS — Z833 Family history of diabetes mellitus: Secondary | ICD-10-CM | POA: Diagnosis not present

## 2023-11-25 DIAGNOSIS — R112 Nausea with vomiting, unspecified: Secondary | ICD-10-CM

## 2023-11-25 DIAGNOSIS — I2510 Atherosclerotic heart disease of native coronary artery without angina pectoris: Secondary | ICD-10-CM | POA: Diagnosis not present

## 2023-11-25 DIAGNOSIS — K429 Umbilical hernia without obstruction or gangrene: Secondary | ICD-10-CM | POA: Diagnosis not present

## 2023-11-25 DIAGNOSIS — Z8249 Family history of ischemic heart disease and other diseases of the circulatory system: Secondary | ICD-10-CM

## 2023-11-25 DIAGNOSIS — R110 Nausea: Secondary | ICD-10-CM | POA: Diagnosis not present

## 2023-11-25 DIAGNOSIS — Z832 Family history of diseases of the blood and blood-forming organs and certain disorders involving the immune mechanism: Secondary | ICD-10-CM

## 2023-11-25 DIAGNOSIS — M8580 Other specified disorders of bone density and structure, unspecified site: Secondary | ICD-10-CM | POA: Diagnosis present

## 2023-11-25 DIAGNOSIS — D649 Anemia, unspecified: Secondary | ICD-10-CM | POA: Diagnosis present

## 2023-11-25 DIAGNOSIS — Z820 Family history of epilepsy and other diseases of the nervous system: Secondary | ICD-10-CM

## 2023-11-25 DIAGNOSIS — F32A Depression, unspecified: Secondary | ICD-10-CM | POA: Diagnosis not present

## 2023-11-25 DIAGNOSIS — Z555 Less than a high school diploma: Secondary | ICD-10-CM

## 2023-11-25 DIAGNOSIS — Z8041 Family history of malignant neoplasm of ovary: Secondary | ICD-10-CM | POA: Diagnosis not present

## 2023-11-25 DIAGNOSIS — R1915 Other abnormal bowel sounds: Secondary | ICD-10-CM | POA: Diagnosis not present

## 2023-11-25 DIAGNOSIS — R188 Other ascites: Secondary | ICD-10-CM | POA: Diagnosis not present

## 2023-11-25 DIAGNOSIS — K567 Ileus, unspecified: Secondary | ICD-10-CM | POA: Diagnosis not present

## 2023-11-25 DIAGNOSIS — Z808 Family history of malignant neoplasm of other organs or systems: Secondary | ICD-10-CM

## 2023-11-25 DIAGNOSIS — K5900 Constipation, unspecified: Secondary | ICD-10-CM | POA: Diagnosis not present

## 2023-11-25 DIAGNOSIS — Z823 Family history of stroke: Secondary | ICD-10-CM | POA: Diagnosis not present

## 2023-11-25 DIAGNOSIS — K8689 Other specified diseases of pancreas: Secondary | ICD-10-CM | POA: Diagnosis not present

## 2023-11-25 DIAGNOSIS — R197 Diarrhea, unspecified: Secondary | ICD-10-CM | POA: Diagnosis not present

## 2023-11-25 DIAGNOSIS — N281 Cyst of kidney, acquired: Secondary | ICD-10-CM | POA: Diagnosis not present

## 2023-11-25 DIAGNOSIS — E860 Dehydration: Secondary | ICD-10-CM | POA: Diagnosis not present

## 2023-11-25 DIAGNOSIS — Z8261 Family history of arthritis: Secondary | ICD-10-CM | POA: Diagnosis not present

## 2023-11-25 LAB — CBC WITH DIFFERENTIAL/PLATELET
Abs Immature Granulocytes: 0.05 K/uL (ref 0.00–0.07)
Basophils Absolute: 0 K/uL (ref 0.0–0.1)
Basophils Relative: 0 %
Eosinophils Absolute: 0 K/uL (ref 0.0–0.5)
Eosinophils Relative: 1 %
HCT: 40.4 % (ref 36.0–46.0)
Hemoglobin: 13.3 g/dL (ref 12.0–15.0)
Immature Granulocytes: 1 %
Lymphocytes Relative: 12 %
Lymphs Abs: 0.7 K/uL (ref 0.7–4.0)
MCH: 32.5 pg (ref 26.0–34.0)
MCHC: 32.9 g/dL (ref 30.0–36.0)
MCV: 98.8 fL (ref 80.0–100.0)
Monocytes Absolute: 0.4 K/uL (ref 0.1–1.0)
Monocytes Relative: 7 %
Neutro Abs: 4.9 K/uL (ref 1.7–7.7)
Neutrophils Relative %: 79 %
Platelets: 198 K/uL (ref 150–400)
RBC: 4.09 MIL/uL (ref 3.87–5.11)
RDW: 15.1 % (ref 11.5–15.5)
WBC: 6.2 K/uL (ref 4.0–10.5)
nRBC: 0 % (ref 0.0–0.2)

## 2023-11-25 LAB — URINALYSIS, ROUTINE W REFLEX MICROSCOPIC
Bilirubin Urine: NEGATIVE
Glucose, UA: NEGATIVE mg/dL
Hgb urine dipstick: NEGATIVE
Ketones, ur: 20 mg/dL — AB
Leukocytes,Ua: NEGATIVE
Nitrite: NEGATIVE
Protein, ur: NEGATIVE mg/dL
pH: 5 (ref 5.0–8.0)

## 2023-11-25 LAB — COMPREHENSIVE METABOLIC PANEL WITH GFR
ALT: 18 U/L (ref 0–44)
AST: 20 U/L (ref 15–41)
Albumin: 3.8 g/dL (ref 3.5–5.0)
Alkaline Phosphatase: 154 U/L — ABNORMAL HIGH (ref 38–126)
Anion gap: 14 (ref 5–15)
BUN: 22 mg/dL (ref 8–23)
CO2: 23 mmol/L (ref 22–32)
Calcium: 9.8 mg/dL (ref 8.9–10.3)
Chloride: 102 mmol/L (ref 98–111)
Creatinine, Ser: 1.07 mg/dL — ABNORMAL HIGH (ref 0.44–1.00)
GFR, Estimated: 52 mL/min — ABNORMAL LOW (ref >60)
Glucose, Bld: 143 mg/dL — ABNORMAL HIGH (ref 70–99)
Potassium: 4.4 mmol/L (ref 3.5–5.1)
Sodium: 139 mmol/L (ref 135–145)
Total Bilirubin: 1.5 mg/dL — ABNORMAL HIGH (ref 0.0–1.2)
Total Protein: 7.8 g/dL (ref 6.5–8.1)

## 2023-11-25 LAB — GLUCOSE, CAPILLARY
Glucose-Capillary: 109 mg/dL — ABNORMAL HIGH (ref 70–99)
Glucose-Capillary: 149 mg/dL — ABNORMAL HIGH (ref 70–99)

## 2023-11-25 LAB — TSH: TSH: 2.961 u[IU]/mL (ref 0.350–4.500)

## 2023-11-25 LAB — HEMOGLOBIN A1C
Hgb A1c MFr Bld: 6.8 % — ABNORMAL HIGH (ref 4.8–5.6)
Mean Plasma Glucose: 148.46 mg/dL

## 2023-11-25 LAB — LIPASE, BLOOD: Lipase: 28 U/L (ref 11–51)

## 2023-11-25 MED ORDER — LINACLOTIDE 145 MCG PO CAPS: 290.0000 ug | ORAL_CAPSULE | Freq: Every day | ORAL | Status: DC

## 2023-11-25 MED ORDER — HYDROXYCHLOROQUINE SULFATE 200 MG PO TABS
200.0000 mg | ORAL_TABLET | Freq: Every day | ORAL | Status: DC
  Administered 2023-11-25 – 2023-11-29 (×5): 200 mg via ORAL
  Filled 2023-11-25 (×5): qty 1

## 2023-11-25 MED ORDER — PRAVASTATIN SODIUM 40 MG PO TABS
80.0000 mg | ORAL_TABLET | Freq: Every evening | ORAL | Status: DC
  Administered 2023-11-25 – 2023-11-28 (×4): 80 mg via ORAL
  Filled 2023-11-25 (×4): qty 2

## 2023-11-25 MED ORDER — MORPHINE SULFATE (PF) 2 MG/ML IV SOLN
2.0000 mg | INTRAVENOUS | Status: DC | PRN
  Administered 2023-11-26: 2 mg via INTRAVENOUS
  Filled 2023-11-25: qty 1

## 2023-11-25 MED ORDER — ACETAMINOPHEN 650 MG RE SUPP: 650.0000 mg | Freq: Four times a day (QID) | RECTAL | Status: DC | PRN

## 2023-11-25 MED ORDER — METOPROLOL TARTRATE 50 MG PO TABS
50.0000 mg | ORAL_TABLET | Freq: Two times a day (BID) | ORAL | Status: DC
  Administered 2023-11-25 – 2023-11-29 (×8): 50 mg via ORAL
  Filled 2023-11-25 (×8): qty 1

## 2023-11-25 MED ORDER — PANTOPRAZOLE SODIUM 40 MG IV SOLR
40.0000 mg | INTRAVENOUS | Status: DC
  Administered 2023-11-25 – 2023-11-28 (×4): 40 mg via INTRAVENOUS
  Filled 2023-11-25 (×4): qty 10

## 2023-11-25 MED ORDER — BUSPIRONE HCL 5 MG PO TABS
5.0000 mg | ORAL_TABLET | Freq: Three times a day (TID) | ORAL | Status: DC
  Administered 2023-11-25 – 2023-11-29 (×12): 5 mg via ORAL
  Filled 2023-11-25 (×12): qty 1

## 2023-11-25 MED ORDER — LEVOTHYROXINE SODIUM 50 MCG PO TABS
50.0000 ug | ORAL_TABLET | Freq: Every day | ORAL | Status: DC
  Administered 2023-11-26 – 2023-11-29 (×4): 50 ug via ORAL
  Filled 2023-11-25 (×4): qty 1

## 2023-11-25 MED ORDER — ONDANSETRON HCL 4 MG/2ML IJ SOLN
4.0000 mg | Freq: Once | INTRAMUSCULAR | Status: AC
  Administered 2023-11-25: 4 mg via INTRAVENOUS
  Filled 2023-11-25: qty 2

## 2023-11-25 MED ORDER — ONDANSETRON HCL 4 MG/2ML IJ SOLN
4.0000 mg | Freq: Four times a day (QID) | INTRAMUSCULAR | Status: DC | PRN
  Administered 2023-11-26 – 2023-11-28 (×4): 4 mg via INTRAVENOUS
  Filled 2023-11-25 (×4): qty 2

## 2023-11-25 MED ORDER — INSULIN ASPART 100 UNIT/ML IJ SOLN
0.0000 [IU] | Freq: Three times a day (TID) | INTRAMUSCULAR | Status: DC
  Administered 2023-11-26: 2 [IU] via SUBCUTANEOUS
  Administered 2023-11-27 – 2023-11-29 (×3): 1 [IU] via SUBCUTANEOUS

## 2023-11-25 MED ORDER — ONDANSETRON HCL 4 MG PO TABS: 4.0000 mg | ORAL_TABLET | Freq: Four times a day (QID) | ORAL | Status: DC | PRN

## 2023-11-25 MED ORDER — SODIUM CHLORIDE 0.9 % IV SOLN: INTRAVENOUS | Status: DC

## 2023-11-25 MED ORDER — LACTATED RINGERS IV SOLN: INTRAVENOUS | Status: AC

## 2023-11-25 MED ORDER — ACETAMINOPHEN 325 MG PO TABS
650.0000 mg | ORAL_TABLET | Freq: Four times a day (QID) | ORAL | Status: DC | PRN
  Administered 2023-11-26 – 2023-11-27 (×2): 650 mg via ORAL
  Filled 2023-11-25 (×2): qty 2

## 2023-11-25 MED ORDER — ALPRAZOLAM 0.5 MG PO TABS: 0.5000 mg | ORAL_TABLET | Freq: Every day | ORAL | Status: DC | PRN

## 2023-11-25 MED ORDER — ENOXAPARIN SODIUM 40 MG/0.4ML IJ SOSY
40.0000 mg | PREFILLED_SYRINGE | INTRAMUSCULAR | Status: DC
  Administered 2023-11-25 – 2023-11-28 (×4): 40 mg via SUBCUTANEOUS
  Filled 2023-11-25 (×4): qty 0.4

## 2023-11-25 MED ORDER — POLYETHYLENE GLYCOL 3350 17 G PO PACK
17.0000 g | PACK | ORAL | Status: AC
  Administered 2023-11-25 (×6): 17 g via ORAL
  Filled 2023-11-25 (×3): qty 1

## 2023-11-25 MED ORDER — IOHEXOL 300 MG/ML  SOLN
100.0000 mL | Freq: Once | INTRAMUSCULAR | Status: AC | PRN
  Administered 2023-11-25: 100 mL via INTRAVENOUS

## 2023-11-25 NOTE — Telephone Encounter (Signed)
 Noted

## 2023-11-25 NOTE — Assessment & Plan Note (Signed)
 Continue with as needed alprazolam , and scheduled buspirone 

## 2023-11-25 NOTE — Consult Note (Signed)
 Gastroenterology Consult   Referring Provider: [facility] Primary Care Physician:  Lavell Bari LABOR, FNP Primary Gastroenterologist:  Dr. Shaaron   Patient ID: Rhonda Garcia; [id number]; Jul 24, 1940   Admit date: 11/25/2023  LOS: 0 days   Date of Consultation: 11/25/2023  Reason for Consultation:  constipation  History of Present Illness   Rhonda Garcia is an 83 y.o. year old female with a history of afib, anemia, CAD, DM, dyslipidemia, hypothyroidism, chronic constipation, adenomatous colon polyps, GERD, presenting to the ED today with abdominal pain, vomiting, constipation. Admitted with constipation/obstipation and CT showing enteritis vs adynamic ileus, liquid unformed stool in colon. GI consulted to assist with management.   In the ED: no leukocytosis, Hgb normal at 13.3. lipase normal. Creatinine 1.07, Alk Phos 154 (previously normal several months ago), Tbili 1.5, transaminases normal.   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 Tried multiple OTC agents including castor oil and a powdered laxative. Associated burping. Poor appetite past week. Feels full up to her neck. Has vomited twice since this morning. Historically will take constulose  prn and has been prescribed Linzess  290 mcg daily but forgets to take this. She takes rybelsus  daily and has had sluggish bowel habits while on this. She is also on chronic tramadol . She saw her PCP on 8/15 and was recommended Movantik . Xray with right-sided stool. No overt GI bleeding.   EGD 10/12/2022: Normal esophagus, small hiatal hernia, normal-appearing gastric mucosa, patent pylorus.  Empiric esophageal dilation was performed.    Colonoscopy  10/12/2022: Procedure aborted due to poor prep.   Prior colonoscopy 10/02/2018 with diverticulosis, two 4-5 mm polyps that were tubular adenoma and recommended no future colonoscopy unless new symptoms develop, due to age.   Past Medical History:  Diagnosis Date   A-fib (HCC)    Anal fissure    resolved    Anemia    Back pain    with left radiculopathy   CAD (coronary artery disease)    Cataract    Collagen vascular disease (HCC)    DDD (degenerative disc disease)    Depression    Diabetes mellitus    Diverticulosis    Dyslipidemia    Dysrhythmia    AFib   Esophagitis    GERD (gastroesophageal reflux disease)    Hiatal hernia    Hx of peptic ulcer    Hyperlipidemia    Hypertension    Hypothyroidism    Internal hemorrhoids 2017   NSTEMI (non-ST elevated myocardial infarction) (HCC) 08/05/2015   Osteopenia    Sleep apnea    not using CPAP now, could not tolerate and PCP aware.   Thyroid  disease     Past Surgical History:  Procedure Laterality Date   ABDOMINAL HYSTERECTOMY Bilateral 2001 - approximate   APPENDECTOMY     BREAST REDUCTION SURGERY     CARDIAC CATHETERIZATION N/A 08/08/2015   Procedure: Left Heart Cath and Coronary Angiography;  Surgeon: Lonni JONETTA Cash, MD;  Location: [facility] INVASIVE CV LAB;  Service: Cardiovascular;  Laterality: N/A;   CATARACT EXTRACTION Bilateral    CHOLECYSTECTOMY  2008 - approximate   COLONOSCOPY  06/2007   Friable anal canal and anal papillae. Pancolonic diverticula. Normal terminal ileum. Status post segmental biopsy, descending colon biopsy showed minimal  cryptitis. Biopsies felt to be  nonspecific but could be seen with NSAIDs. No features of inflammatory bowel disease. Stool studies were negative.   COLONOSCOPY  03/21/2011   RMR: colonic polyps treated as described above, colonic diverticulosis (Tubular adenomas)   COLONOSCOPY WITH PROPOFOL  N/A 06/17/2014   RMR: Colonic diverticulosis. Multiple colonic polyps removed as  described above.    COLONOSCOPY WITH PROPOFOL  N/A 03/19/2016   Procedure: COLONOSCOPY WITH PROPOFOL ;  Surgeon: Lamar CHRISTELLA Hollingshead, MD;  Location: [facility];  Service: Endoscopy;  Laterality: N/A;  8:45am   COLONOSCOPY WITH PROPOFOL  N/A 10/02/2018   Procedure: COLONOSCOPY WITH PROPOFOL ;  Surgeon: Hollingshead Lamar CHRISTELLA, MD;  Location: [facility];  Service: Endoscopy;  Laterality: N/A;  9:15am   CORONARY ANGIOPLASTY WITH STENT PLACEMENT     4 stents   ESOPHAGEAL DILATION N/A 06/17/2014   Procedure: ESOPHAGEAL DILATION;  Surgeon: Lamar CHRISTELLA Hollingshead, MD;  Location: [facility];  Service: Endoscopy;  Laterality: N/AMERL Stai 54 Fr   ESOPHAGOGASTRODUODENOSCOPY  06/2007   Small hiatal hernia   ESOPHAGOGASTRODUODENOSCOPY  03/21/2011   RMR: normal esophagus- status post passage of Maloney dilator. Small hiatal hernia. Trivial antral and bulbar erosions   ESOPHAGOGASTRODUODENOSCOPY (EGD) WITH PROPOFOL  N/A 06/17/2014   RMR: Small hiatal hernia; otherwise normal EGD. Status post passage of a Maloney dilator. No explanation for patients right upper quadrant abdominal pain.    ESOPHAGOGASTRODUODENOSCOPY (EGD) WITH PROPOFOL  N/A 03/19/2016   Procedure: ESOPHAGOGASTRODUODENOSCOPY (EGD) WITH PROPOFOL ;  Surgeon: Lamar CHRISTELLA Hollingshead, MD;  [facility];  Service: Endoscopy;  Laterality: N/A;   ESOPHAGOGASTRODUODENOSCOPY (EGD) WITH PROPOFOL  N/A 10/12/2022   Procedure: ESOPHAGOGASTRODUODENOSCOPY (EGD) WITH PROPOFOL ;  Surgeon: Hollingshead Lamar CHRISTELLA, MD;  Location: [facility];  Service: Endoscopy;  Laterality: N/A;   FLEXIBLE SIGMOIDOSCOPY N/A 10/12/2022   Procedure: FLEXIBLE SIGMOIDOSCOPY;  Surgeon: Hollingshead Lamar CHRISTELLA, MD;  [facility]: [facility];  Service: Endoscopy;  Laterality: N/A;   KNEE SURGERY Left    arthroscopy   LIPOMA EXCISION  03/17/2012   Procedure: EXCISION LIPOMA;  Surgeon: Lynwood MALVA Pina, MD;  Location: [facility];  Service: General;  Laterality: Right;  excision of right lower back lipoma   MALONEY  DILATION N/A 03/19/2016   Procedure: STAI DILATION;  Surgeon: Lamar CHRISTELLA Hollingshead, MD;  [facility];  Service: Endoscopy;  Laterality: N/A;   MALONEY DILATION N/A 10/12/2022   Procedure: STAI DILATION;  Surgeon: Hollingshead Lamar CHRISTELLA, MD;  [facility];  Service: Endoscopy;  Laterality: N/A;   PATELLA FRACTURE SURGERY Right    POLYPECTOMY  06/17/2014   Procedure: POLYPECTOMY;  Surgeon: Lamar CHRISTELLA Hollingshead, MD;  Location: [facility];  Service: Endoscopy;;   POLYPECTOMY  10/02/2018   Procedure: POLYPECTOMY;  Surgeon: Hollingshead Lamar CHRISTELLA, MD;  Location[facility];  Service: Endoscopy;;  colon   REDUCTION MAMMAPLASTY Bilateral     Prior to Admission medications   Medication Sig Start Date End Date Taking? Authorizing Provider  ALPRAZolam  (XANAX ) 0.5 MG tablet Take 1 tablet (0.5 mg total) by mouth daily as needed. Patient taking differently: Take 0.5 mg by mouth daily as needed for anxiety. 10/18/23  Yes Hawks, Christy A, FNP  castor oil liquid Take 15 mLs by mouth daily.   Yes [provider]  diclofenac  (VOLTAREN ) 75 MG EC tablet Take 1 tablet (75 mg total) by mouth 2 (two) times daily. 09/23/23  Yes Hawks, Christy A, FNP  hydroxychloroquine  (PLAQUENIL ) 200 MG tablet TAKE ONE (1) TABLET BY  MOUTH EVERY DAY 12/12/20  Yes Rice, Lonni ORN, MD  isosorbide  mononitrate (IMDUR ) 60 MG 24 hr tablet Take 60 mg by mouth daily.   Yes [provider]  lactulose  (CHRONULAC ) 10 GM/15ML solution Take 15 mLs (10 g total) by mouth daily as needed for mild constipation or moderate constipation. 10/18/23  Yes Hawks, Bari A, FNP  levothyroxine  (SYNTHROID ) 50 MCG tablet TAKE ONE (1) TABLET EACH DAY 06/17/23  Yes Hawks, Christy A, FNP  linaclotide  (LINZESS ) 290 MCG CAPS capsule Take 1 capsule (290 mcg total) by mouth daily before breakfast. 10/18/23  Yes Hawks, Christy A, FNP  lisinopril  (ZESTRIL ) 10 MG tablet TAKE ONE (1) TABLET EACH DAY 09/19/23  Yes Hawks, Christy A, FNP  meclizine  (ANTIVERT )  12.5 MG tablet Take 1 tablet (12.5 mg total) by mouth 3 (three) times daily as needed for dizziness (Vertigo). 08/01/23  Yes Dettinger, Fonda LABOR, MD  metoprolol  tartrate (LOPRESSOR ) 50 MG tablet Take 1 tablet (50 mg total) by mouth 2 (two) times daily. 10/18/23  Yes Hawks, Bari LABOR, FNP  Misc Natural Products (COLON CLEANSE PO) Take 1 Scoop by mouth daily.   Yes [provider]  nitroGLYCERIN  (NITROSTAT ) 0.4 MG SL tablet Place 1 tablet (0.4 mg total) under the tongue every 5 (five) minutes as needed for chest pain. 11/21/23  Yes Hawks, Christy A, FNP  ondansetron  (ZOFRAN ) 4 MG tablet Take 4 mg by mouth every 8 (eight) hours as needed for vomiting or nausea. 10/18/23  Yes [provider]  pantoprazole  (PROTONIX ) 40 MG tablet Take 1 tablet (40 mg total) by mouth 2 (two) times daily. 10/18/23  Yes Hawks, Christy A, FNP  polyethylene glycol (MIRALAX  / GLYCOLAX ) 17 g packet Take 17 g by mouth daily.   Yes [provider]  pravastatin  (PRAVACHOL ) 80 MG tablet Take 1 tablet (80 mg total) by mouth every evening. Patient taking differently: Take 80 mg by mouth daily. 10/18/23  Yes Hawks, Christy A, FNP  Semaglutide  (RYBELSUS ) 7 MG TABS Take 1 tablet (7 mg total) by mouth daily. 07/18/23  Yes Hawks, Christy A, FNP  naloxegol  oxalate (MOVANTIK ) 12.5 MG TABS tablet Take 1 tablet (12.5 mg total) by mouth daily. To replace linzess  11/22/23   Jolinda Norene HERO, DO    Current Facility-Administered Medications  Medication Dose Route Frequency Provider Last Rate Last Admin   acetaminophen  (TYLENOL ) tablet 650 mg  650 mg Oral Q6H PRN Arrien, Elidia Sieving, MD       Or   acetaminophen  (TYLENOL ) suppository 650 mg  650 mg Rectal Q6H PRN Arrien, Mauricio Daniel, MD       ALPRAZolam  (XANAX ) tablet 0.5 mg  0.5 mg Oral Daily PRN Arrien, Mauricio Daniel, MD       busPIRone  (BUSPAR ) tablet 5 mg  5 mg Oral TID Arrien, Elidia Sieving, MD       enoxaparin  (LOVENOX ) injection 40 mg  40 mg Subcutaneous  Q24H Arrien, Elidia Sieving, MD       hydroxychloroquine  (PLAQUENIL ) tablet 200 mg  200 mg Oral Daily Arrien, Elidia Sieving, MD       insulin  aspart (novoLOG ) injection 0-9 Units  0-9 Units Subcutaneous TID WC Arrien, Elidia Sieving, MD       lactated ringers  infusion   Intravenous Continuous Arrien, Mauricio Daniel, MD       [START ON 11/26/2023] levothyroxine  (SYNTHROID ) tablet 50 mcg  50 mcg Oral Q0600 Arrien, Mauricio Daniel, MD       metoprolol  tartrate (LOPRESSOR ) tablet 50 mg  50 mg Oral BID Arrien, Elidia Sieving, MD       morphine  (PF) 2 MG/ML injection 2 mg  2 mg Intravenous Q2H PRN Arrien, Mauricio Daniel, MD       ondansetron  (ZOFRAN ) tablet 4 mg  4 mg Oral Q6H PRN Arrien, Elidia Sieving, MD       Or   ondansetron  (ZOFRAN ) injection 4 mg  4 mg Intravenous Q6H PRN Arrien, Elidia Sieving, MD       pantoprazole  (PROTONIX ) injection 40 mg  40 mg Intravenous Q24H Arrien, Elidia Sieving, MD       pravastatin  (PRAVACHOL ) tablet 80 mg  80 mg Oral QPM Arrien, Elidia Sieving, MD        Allergies as of 11/25/2023 - Review Complete 11/25/2023  Allergen Reaction Noted   Fentanyl  Anaphylaxis 12/10/2020   Promethazine  hcl Anaphylaxis 09/06/2010   Farxiga  [dapagliflozin ] Other (See Comments) 11/01/2022   Jardiance  [empagliflozin ] Other (See Comments) 09/21/2022   Metformin  and related Other (See Comments) 09/21/2022    Family History  Problem Relation Age of Onset   Heart attack Mother    Hypertension Mother    Stroke Mother        Deceased, age 48   Diabetes Sister    Arthritis Sister    Diabetes Sister    Cancer Sister        ovarian   Stroke Sister    Alzheimer's disease Sister    Diabetes Brother        also has a blood disorder    Clotting disorder Brother    Kidney disease Brother    Heart disease Brother    Melanoma Brother        deceased   Diabetes Son    Hypertension Son    Coronary artery disease Son 54       CABG   Stroke Son    Hypertension Son     Coronary artery disease Son 40       CABG   Colon cancer Neg Hx    Breast cancer Neg Hx     Social History   Socioeconomic History   Marital status: Married    Spouse name: Not on file   Number of children: 4   Years of education: 6   Highest education level: 6th grade  Occupational History   Occupation: Retired    Comment: Engineer, site  Tobacco Use   Smoking status: Never   Smokeless tobacco: Never  Vaping Use   Vaping status: Never Used  Substance and Sexual Activity   Alcohol use: Not Currently    Alcohol/week: 1.0 standard drink of alcohol    Types: 1 Glasses of wine per week   Drug use: No   Sexual activity: Not Currently    Birth control/protection: Post-menopausal  Other Topics Concern   Not on file  Social History Narrative    Has 4 adult children. She lives home with one of her sons who recently had a stroke and needs 24 hour care. She is the primary caregiver and is now legally separated from her husband.    Social Drivers of Corporate investment banker Strain: Low Risk  (10/15/2023)   Overall Financial Resource Strain (CARDIA)    Difficulty of Paying Living Expenses: Not hard at all  Food Insecurity: No Food Insecurity (10/15/2023)   Hunger Vital Sign    Worried About Running Out of Food in the Last Year: Never true    Ran Out of Food in the  Last Year: Never true  Transportation Needs: No Transportation Needs (10/15/2023)   PRAPARE - Administrator, Civil Service (Medical): No    Lack of Transportation (Non-Medical): No  Physical Activity: Insufficiently Active (10/15/2023)   Exercise Vital Sign    Days of Exercise per Week: 4 days    Minutes of Exercise per Session: 10 min  Stress: No Stress Concern Present (10/15/2023)   [name] of Occupational Health - Occupational Stress Questionnaire    Feeling of Stress: Not at all  Social Connections: Moderately Isolated (10/15/2023)   Social Connection and Isolation Panel    Frequency of  Communication with Friends and Family: More than three times a week    Frequency of Social Gatherings with Friends and Family: More than three times a week    Attends Religious Services: More than 4 times per year    Active Member of [employer] or Organizations: No    Attends Banker Meetings: Never    Marital Status: Widowed  Intimate Partner Violence: Not At Risk (10/15/2023)   Humiliation, Afraid, Rape, and Kick questionnaire    Fear of Current or Ex-Partner: No    Emotionally Abused: No    Physically Abused: No    Sexually Abused: No     Review of Systems   See HPI  Physical Exam   Vital Signs in last 24 hours: Temp:  [97.6 F (36.4 C)-97.9 F (36.6 C)] 97.9 F (36.6 C) (08/18 1518) Pulse Rate:  [88-121] 88 (08/18 1518) Resp:  [14-20] 20 (08/18 1518) BP: (124-172)/(78-101) 172/101 (08/18 1518) SpO2:  [89 %-98 %] 98 % (08/18 1518) Weight:  [70.2 kg-72.1 kg] 70.2 kg (08/18 1518)    General:   Alert, pleasant, no distress Head:  Normocephalic and atraumatic. Eyes:  Sclera clear, no icterus.   Conjunctiva pink. Lungs:  Clear throughout to auscultation.  Heart:  S1 S2 irregularly irregular Abdomen:  +BS, distended but soft, mildly TTP lower abdomen, no rebound or guarding Rectal: deferred   Msk:  Symmetrical without gross deformities. Normal posture. Extremities:  Without edema. Neurologic:  Alert and  oriented x4. Skin:  Intact without significant lesions or rashes. Psych:  Alert and cooperative. Normal mood and affect.  Intake/Output from previous day: No intake/output data recorded. Intake/Output this shift: No intake/output data recorded.    Labs/Studies   Recent Labs Recent Labs    11/25/23 1141  WBC 6.2  HGB 13.3  HCT 40.4  PLT 198   BMET Recent Labs    11/25/23 1141  NA 139  K 4.4  CL 102  CO2 23  GLUCOSE 143*  BUN 22  CREATININE 1.07*  CALCIUM  9.8   LFT Recent Labs    11/25/23 1141  PROT 7.8  ALBUMIN 3.8  AST 20  ALT 18   ALKPHOS 154*  BILITOT 1.5*     Radiology/Studies CT ABDOMEN PELVIS W CONTRAST Result Date: 11/25/2023 CLINICAL DATA:  Bowel obstruction suspected. EXAM: CT ABDOMEN AND PELVIS WITH CONTRAST TECHNIQUE: Multidetector CT imaging of the abdomen and pelvis was performed using the standard protocol following bolus administration of intravenous contrast. RADIATION DOSE REDUCTION: This exam was performed according to the departmental dose-optimization program which includes automated exposure control, adjustment of the mA and/or kV according to patient size and/or use of iterative reconstruction technique. CONTRAST:  OMNIPAQUE  IOHEXOL  300 MG/ML  SOLN COMPARISON:  CT scan abdomen and pelvis from 06/20/2023. FINDINGS: Lower chest: There are patchy atelectatic changes in the visualized lung bases.  No overt consolidation. No pleural effusion. The heart is normal in size. No pericardial effusion. Hepatobiliary: The liver is normal in size. Non-cirrhotic configuration. No suspicious mass. No intrahepatic bile duct dilation. There is mild prominence of the extrahepatic bile duct, most likely due to post cholecystectomy status. Gallbladder is surgically absent. Pancreas: There are 2 adjacent hypoattenuating lesions in the tail with larger lesion measuring up to 7 x 9 mm. These are incompletely characterized on the current exam but appears grossly similar to the prior study. 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 Further evaluation with nonemergent MRI abdomen/MRCP protocol is recommended. The pancreas is otherwise unremarkable. There is mild-to-moderate diffuse atrophy/fatty replacement. No peripancreatic fat stranding. Spleen: Within normal limits. No focal lesion. Adrenals/Urinary Tract: Adrenal glands are unremarkable. No suspicious renal mass. There is a stable sinus cysts in the right kidney upper pole. No nephroureterolithiasis or obstructive uropathy on either side. Urinary bladder is under  distended, precluding optimal assessment. However, no large mass or stones identified. No perivesical fat stranding. Stomach/Bowel: The appendix was not visualized; however there is no acute inflammatory process in the right lower quadrant. The stomach is distended with fluid and air. The duodenal and proximal jejunal bowel loops are nondilated. However, there is disproportionate dilation of multiple small bowel loops measuring in diameter up to 3.6 cm. There is no associated abnormal bowel wall thickening. However, there is mild perivesical fat stranding and prominence of vasa recta. No discrete transition point. Distal multiple small bowel loops are nondilated. There is liquid unformed stool in the colon. There are multiple colonic diverticula mainly in the sigmoid/distal descending colon without diverticulitis. Vascular/Lymphatic: There is trace ascites in the dependent pelvis. No walled-off abscess or loculated collection. No pneumoperitoneum. No abdominal or pelvic lymphadenopathy, by size criteria. No aneurysmal dilation of the major abdominal arteries. There are moderate peripheral atherosclerotic vascular calcifications of the aorta and its major branches. Reproductive: The uterus is surgically absent. No large adnexal mass. Other: There is a tiny fat containing umbilical hernia. The soft tissues and abdominal wall are otherwise unremarkable. Musculoskeletal: No suspicious osseous lesions. There are mild multilevel degenerative changes in the visualized spine. IMPRESSION: 1. There is disproportionate dilation of multiple small bowel loops without discrete transition point. Findings favor enteritis/adynamic ileus. 2. There is liquid unformed stool in the colon, nonspecific but can be seen with diarrhea. 3. No pneumoperitoneum, pneumatosis or walled-off abscess. 4. Multiple other nonacute observations, as described above. Aortic Atherosclerosis (ICD10-I70.0). Electronically Signed   By: Ree Molt M.D.    On: 11/25/2023 13:12     Assessment   Flecia TIEN AISPURO is an 83 y.o. year old female with a history of afib, anemia, CAD, DM, dyslipidemia, hypothyroidism, chronic constipation, adenomatous colon polyps, GERD, presenting to the ED today with abdominal pain, vomiting, constipation. Admitted with constipation/obstipation and CT showing enteritis vs adynamic ileus, liquid unformed stool in colon. GI consulted to assist with management.   Constipation/obstipation: with CT findings of likely adynamic ileus suspect secondary to chronic tramadol  and daily rybelsus , in addition to need for more aggressive bowel regimen as outpatient. I do note she was recommended to start Movantik  when seen by PCP on 8/15. As outpatient, she had been on Linzess  290 mcg daily but forgets to take this. Discussed with her need for daily agent to assist with motility. She may benefit from [facility] in the future. Does not clinically appear obstructed so will try to gently help with bowel purge. She may or may not  be able to tolerate this.   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 Can follow-up as outpatient with MRI.   Elevated alk phos: 154 today, non-specific. Previously 131 and 128 in July. Follow serially.   Plan / Recommendations    Miralax  purge today if able to tolerate, then start Linzess  290 mcg daily tomorrow. May want to consider Motegrity in future Check TSH, monitor electrolytes Clear liquids Will reassess in morning     11/25/2023, 3:32 PM  Therisa MICAEL Stager, PhD, ANP-BC [facility]

## 2023-11-25 NOTE — Assessment & Plan Note (Signed)
 Continue glucose cover and monitoring with insulin sliding scale.  Fasting glucose today 141 mg/dl.   Continue with statin

## 2023-11-25 NOTE — Assessment & Plan Note (Signed)
 Continue with levothyroxine

## 2023-11-25 NOTE — Assessment & Plan Note (Signed)
 Continue rate control with metoprolol   Patient has been off anticoagulation

## 2023-11-25 NOTE — ED Notes (Signed)
 Pt to ct

## 2023-11-25 NOTE — ED Notes (Signed)
 Transport called for transport upstairs.

## 2023-11-25 NOTE — Assessment & Plan Note (Addendum)
 Continue conservative medical therapy.  Bowel rest, except sips with meds.  IV fluids with balanced electrolyte solution. IV antiacids and as needed antiemetics/ analgesics  Keep K at 4 and Mg at 2  Hold on antibiotic therapy for now.  Follow up with GI recommendations

## 2023-11-25 NOTE — Progress Notes (Signed)
 Mobility Specialist Progress Note:    11/25/23 1518  Therapy Vitals  Temp 97.9 F (36.6 C)  Temp Source Oral  Pulse Rate 88  Resp 20  BP (!) 172/101  Oxygen Therapy  SpO2 98 %  O2 Device Room Air  Mobility  Activity Ambulated with assistance  Level of Assistance Modified independent, requires aide device or extra time  Assistive Device None  Distance Ambulated (ft) 30 ft  Range of Motion/Exercises Active;All extremities  Activity Response Tolerated well  Mobility Referral Yes  Mobility visit 1 Mobility  Mobility Specialist Start Time (ACUTE ONLY) 1513  Mobility Specialist Stop Time (ACUTE ONLY) 1524  Mobility Specialist Time Calculation (min) (ACUTE ONLY) 11 min   Assisted pt to bathroom, ModI to stand and ambulate with no AD. Tolerated well, asx throughout. Left pt sitting EOB with nursing staff, all needs met.  Sherrilee Ditty Mobility Specialist Please contact via Special educational needs teacher or  Rehab office at [phone]

## 2023-11-25 NOTE — ED Provider Notes (Signed)
 [place] EMERGENCY DEPARTMENT AT [facility] Provider Note   CSN: [id number] Arrival date & time: 11/25/23  1109     Patient presents with: Constipation   Rhonda Garcia is a 83 y.o. female.   HPI Pt presented to ED Via [facility]. EMS called out for sick person. Pt called her PCP today and told them what was going on. PCP stated she needs to be seen in the ED because Pt had a scan Friday that showed pt is full of stool Pt's got a history of AFIB and hr. Bouncing between 60-120 per EMS. Pt states last bowel movement was today and was just brown water  and had a stringy bowel movement on Saturday. Bowel sounds present in all quadrants.   Evaluation Friday, 3 days ago with x-ray. Since that time symptoms have become worse.    Prior to Admission medications   Medication Sig Start Date End Date Taking? Authorizing Provider  acetaminophen  (TYLENOL ) 650 MG CR tablet Take 650 mg by mouth every 8 (eight) hours as needed for pain.    [provider]  ALPRAZolam  (XANAX ) 0.5 MG tablet Take 1 tablet (0.5 mg total) by mouth daily as needed. 10/18/23   Lavell Lye A, FNP  Baclofen  5 MG TABS Take 1 tablet (5 mg total) by mouth 3 (three) times daily as needed. 10/18/23   Lavell Lye A, FNP  busPIRone  (BUSPAR ) 5 MG tablet Take 5 mg by mouth 3 (three) times daily.    [provider]  diclofenac  (VOLTAREN ) 75 MG EC tablet Take 1 tablet (75 mg total) by mouth 2 (two) times daily. 09/23/23   Lavell Lye LABOR, FNP  hydroxychloroquine  (PLAQUENIL ) 200 MG tablet TAKE ONE (1) TABLET BY MOUTH EVERY DAY 12/12/20   Rice, Lonni ORN, MD  isosorbide  mononitrate (IMDUR ) 60 MG 24 hr tablet Take 60 mg by mouth daily.    [provider]  lactulose  (CHRONULAC ) 10 GM/15ML solution Take 15 mLs (10 g total) by mouth daily as needed for mild constipation or moderate constipation. 10/18/23   Lavell Lye LABOR, FNP  levothyroxine  (SYNTHROID ) 50 MCG tablet TAKE ONE (1) TABLET EACH DAY  06/17/23   Lavell Lye A, FNP  linaclotide  (LINZESS ) 290 MCG CAPS capsule Take 1 capsule (290 mcg total) by mouth daily before breakfast. 10/18/23   Lavell Lye A, FNP  lisinopril  (ZESTRIL ) 10 MG tablet TAKE ONE (1) TABLET EACH DAY 09/19/23   Hawks, Lye A, FNP  meclizine  (ANTIVERT ) 12.5 MG tablet Take 1 tablet (12.5 mg total) by mouth 3 (three) times daily as needed for dizziness (Vertigo). 08/01/23   Dettinger, Fonda LABOR, MD  metoprolol  tartrate (LOPRESSOR ) 50 MG tablet Take 1 tablet (50 mg total) by mouth 2 (two) times daily. 10/18/23   Lavell Lye LABOR, FNP  naloxegol  oxalate (MOVANTIK ) 12.5 MG TABS tablet Take 1 tablet (12.5 mg total) by mouth daily. To replace linzess  11/22/23   Jolinda Potter M, DO  nitroGLYCERIN  (NITROSTAT ) 0.4 MG SL tablet Place 1 tablet (0.4 mg total) under the tongue every 5 (five) minutes as needed for chest pain. 11/21/23   Lavell Lye LABOR, FNP  pantoprazole  (PROTONIX ) 40 MG tablet Take 1 tablet (40 mg total) by mouth 2 (two) times daily. 10/18/23   Lavell Lye A, FNP  polyethylene glycol (MIRALAX  / GLYCOLAX ) 17 g packet Take 17 g by mouth daily.    [provider]  pravastatin  (PRAVACHOL ) 80 MG tablet Take 1 tablet (80 mg total) by mouth every evening. 10/18/23  Hawks, Christy A, FNP  Semaglutide  (RYBELSUS ) 7 MG TABS Take 1 tablet (7 mg total) by mouth daily. 07/18/23   Lavell Bari LABOR, FNP  traMADol  (ULTRAM ) 50 MG tablet Take 1 tablet (50 mg total) by mouth every 8 (eight) hours as needed. 10/18/23   Lavell Bari A, FNP  Wheat Dextrin (BENEFIBER PO) Take 1 Dose by mouth daily.    [provider]    Allergies: Fentanyl , Promethazine  hcl, Jardiance  [empagliflozin ], Metformin  and related, and Farxiga  [dapagliflozin ]    Review of Systems  Updated Vital Signs BP 124/82   Pulse (!) 107   Temp 97.6 F (36.4 C) (Oral)   Resp 19   Ht 5' 2 (1.575 m)   Wt 72.1 kg   SpO2 96%   BMI 29.07 kg/m   Physical Exam Vitals and nursing note  reviewed.  Constitutional:      General: She is not in acute distress.    Appearance: She is well-developed.  HENT:     Head: Normocephalic and atraumatic.  Eyes:     Conjunctiva/sclera: Conjunctivae normal.  Pulmonary:     Effort: Pulmonary effort is normal. No respiratory distress.     Breath sounds: No stridor.  Abdominal:     General: There is no distension.     Tenderness: There is abdominal tenderness.  Skin:    General: Skin is warm and dry.  Neurological:     Mental Status: She is alert and oriented to person, place, and time.     Cranial Nerves: No cranial nerve deficit.  Psychiatric:        Mood and Affect: Mood normal.     (all labs ordered are listed, but only abnormal results are displayed) Labs Reviewed  COMPREHENSIVE METABOLIC PANEL WITH GFR - Abnormal; Notable for the following components:      Result Value   Glucose, Bld 143 (*)    Creatinine, Ser 1.07 (*)    Alkaline Phosphatase 154 (*)    Total Bilirubin 1.5 (*)    GFR, Estimated 52 (*)    All other components within normal limits  LIPASE, BLOOD  CBC WITH DIFFERENTIAL/PLATELET  URINALYSIS, ROUTINE W REFLEX MICROSCOPIC    EKG: None  Radiology: CT ABDOMEN PELVIS W CONTRAST Result Date: 11/25/2023 CLINICAL DATA:  Bowel obstruction suspected. EXAM: CT ABDOMEN AND PELVIS WITH CONTRAST TECHNIQUE: Multidetector CT imaging of the abdomen and pelvis was performed using the standard protocol following bolus administration of intravenous contrast. RADIATION DOSE REDUCTION: This exam was performed according to the departmental dose-optimization program which includes automated exposure control, adjustment of the mA and/or kV according to patient size and/or use of iterative reconstruction technique. CONTRAST:  OMNIPAQUE  IOHEXOL  300 MG/ML  SOLN COMPARISON:  CT scan abdomen and pelvis from 06/20/2023. FINDINGS: Lower chest: There are patchy atelectatic changes in the visualized lung bases. No overt  consolidation. No pleural effusion. The heart is normal in size. No pericardial effusion. Hepatobiliary: The liver is normal in size. Non-cirrhotic configuration. No suspicious mass. No intrahepatic bile duct dilation. There is mild prominence of the extrahepatic bile duct, most likely due to post cholecystectomy status. Gallbladder is surgically absent. Pancreas: There are 2 adjacent hypoattenuating lesions in the tail with larger lesion measuring up to 7 x 9 mm. These are incompletely characterized on the current exam but appears grossly similar to the prior study. 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 Further evaluation with nonemergent MRI abdomen/MRCP protocol is recommended. The pancreas is otherwise unremarkable. There is  mild-to-moderate diffuse atrophy/fatty replacement. No peripancreatic fat stranding. Spleen: Within normal limits. No focal lesion. Adrenals/Urinary Tract: Adrenal glands are unremarkable. No suspicious renal mass. There is a stable sinus cysts in the right kidney upper pole. No nephroureterolithiasis or obstructive uropathy on either side. Urinary bladder is under distended, precluding optimal assessment. However, no large mass or stones identified. No perivesical fat stranding. Stomach/Bowel: The appendix was not visualized; however there is no acute inflammatory process in the right lower quadrant. The stomach is distended with fluid and air. The duodenal and proximal jejunal bowel loops are nondilated. However, there is disproportionate dilation of multiple small bowel loops measuring in diameter up to 3.6 cm. There is no associated abnormal bowel wall thickening. However, there is mild perivesical fat stranding and prominence of vasa recta. No discrete transition point. Distal multiple small bowel loops are nondilated. There is liquid unformed stool in the colon. There are multiple colonic diverticula mainly in the sigmoid/distal descending colon without  diverticulitis. Vascular/Lymphatic: There is trace ascites in the dependent pelvis. No walled-off abscess or loculated collection. No pneumoperitoneum. No abdominal or pelvic lymphadenopathy, by size criteria. No aneurysmal dilation of the major abdominal arteries. There are moderate peripheral atherosclerotic vascular calcifications of the aorta and its major branches. Reproductive: The uterus is surgically absent. No large adnexal mass. Other: There is a tiny fat containing umbilical hernia. The soft tissues and abdominal wall are otherwise unremarkable. Musculoskeletal: No suspicious osseous lesions. There are mild multilevel degenerative changes in the visualized spine. IMPRESSION: 1. There is disproportionate dilation of multiple small bowel loops without discrete transition point. Findings favor enteritis/adynamic ileus. 2. There is liquid unformed stool in the colon, nonspecific but can be seen with diarrhea. 3. No pneumoperitoneum, pneumatosis or walled-off abscess. 4. Multiple other nonacute observations, as described above. Aortic Atherosclerosis (ICD10-I70.0). Electronically Signed   By: Ree Molt M.D.   On: 11/25/2023 13:12     Procedures   Medications Ordered in the ED  0.9 %  sodium chloride  infusion ( Intravenous New Bag/Given 11/25/23 1147)  ondansetron  (ZOFRAN ) injection 4 mg (4 mg Intravenous Given 11/25/23 1148)  iohexol  (OMNIPAQUE ) 300 MG/ML solution 100 mL (100 mLs Intravenous Contrast Given 11/25/23 1237)                                    Medical Decision Making Elderly female presents with abdominal pain, nausea outpatient x-ray few days ago suggest the patient has substantial stool burden.  Concern for obstruction versus other intra-abdominal processes.  Patient is mildly tachycardic on arrival otherwise awake, alert, sitting up in pulse ox 99% room air normal Cardiac 105 sinus tach abnormal  Amount and/or Complexity of Data Reviewed Independent Historian: EMS     Details: Report at bedside External Data Reviewed: notes.    Details: X-ray from 3 days ago reviewed Labs: ordered. Decision-making details documented in ED Course. Radiology: ordered and independent interpretation performed. Decision-making details documented in ED Course.  Risk Prescription drug management. Decision regarding hospitalization. Diagnosis or treatment significantly limited by social determinants of health.   2:39 PM Patient awake, alert, aware of all findings.  No additional vomiting since here, but she continues to complain of abdominal pain. I have reviewed her CT scan, labs.  Labs generally noncontributory, but CT scan with findings concerning for enteritis versus adynamic ileus, no obvious bowel obstruction though there is dilation proximally and clinically patient has signs and symptoms  consistent with effective obstruction. I discussed her case with our gastroenterology colleagues, Dr. Tonie team will see the patient as a consulting service.  Patient will be admitted to the internal medicine team.  Final diagnoses:  Generalized abdominal pain  Adynamic ileus [facility])    ED Discharge Orders     None          Garrick Charleston, MD 11/25/23 1439

## 2023-11-25 NOTE — ED Notes (Signed)
 Pt to [place].

## 2023-11-25 NOTE — ED Triage Notes (Signed)
 Pt presented to ED Via [facility]. EMS called out for sick person. Pt called her PCP today and told them what was going on. PCP stated she needs to be seen in the ED because Pt had a scan Friday that showed pt is full of stool Pt's got a history of AFIB and hr. Bouncing between 60-120 per EMS. Pt states last bowel movement was today and was just brown water  and had a stringy bowel movement on Saturday. Bowel sounds present in all quadrants.

## 2023-11-25 NOTE — Assessment & Plan Note (Signed)
 No chest pain, no acute coronary syndrome.  Continue blood pressure monitoring

## 2023-11-25 NOTE — Telephone Encounter (Signed)
 FYI Only or Action Required?: FYI only for provider.  Patient was last seen in primary care on 11/22/2023 by Jolinda Norene HERO, DO.  Called Nurse Triage reporting Emesis.  Symptoms began a week ago.  Interventions attempted: Nothing.  Symptoms are: rapidly worsening.  Triage Disposition: Go to ED Now (Notify PCP)  Patient/caregiver understands and will follow disposition?: Yes   To ER    Copied from CRM [id number]. Topic: Clinical - Red Word Triage >> Nov 25, 2023  9:03 AM Diannia H wrote: [facility] that prompted transfer to Nurse Triage: Patient called because she is now throwing up bowel as of this morning around 4. She is dehydrated and weak. Its like water , when she burps its bowel. Reason for Disposition  [1] Vomiting AND [2] contains red blood or black (coffee ground) material  (Exception: Few red streaks in vomit that only happened once.)  Answer Assessment - Initial Assessment Questions 1. VOMITING SEVERITY: How many times have you vomited in the past 24 hours?      States vomiting stool, 3 times and also having watery diarrhea 2. ONSET: When did the vomiting begin?      Since last week 3. FLUIDS: What fluids or food have you vomited up today? Have you been able to keep any fluids down?     Water , to ER 4. ABDOMEN PAIN: Are your having any abdomen pain? If Yes : How bad is it and what does it feel like? (e.g., crampy, dull, intermittent, constant)      TO ER 5. DIARRHEA: Is there any diarrhea? If Yes, ask: How many times today?      TO ER 6. CONTACTS: Is there anyone else in the family with the same symptoms?      To ER 7. CAUSE: What do you think is causing your vomiting?     To ER 8. HYDRATION STATUS: Any signs of dehydration? (e.g., dry mouth [not only dry lips], too weak to stand) When did you last urinate?     yes 9. OTHER SYMPTOMS: Do you have any other symptoms? (e.g., fever, headache, vertigo, vomiting blood or coffee grounds,  recent head injury)     Vomiting stool and has watery diarrhea 10. PREGNANCY: Is there any chance you are pregnant? When was your last menstrual period?       na  Protocols used: Vomiting-A-AH

## 2023-11-25 NOTE — H&P (Addendum)
 History and Physical    Patient: Rhonda Garcia FMW:[id number] DOB: 1940/07/04 DOA: 11/25/2023 DOS: the patient was seen and examined on 11/25/2023 PCP: Lavell Bari LABOR, FNP  Patient coming from: Home  Chief Complaint:  Chief Complaint  Patient presents with   Constipation   HPI: Rhonda Garcia is a 83 y.o. female with medical history significant of atrial fibrillation, chronic anemia, coronary artery disease, T2DM, dyslipidemia, GERD, diverticulosis, and hypothyroidism who presented with abdominal pain.    Reported one week of not able to move her bowels, associated with progressive abdominal distention and lower abdominal pain. Very poor appetite with very poor oral intake, mainly drinking water , and having postprandial vomiting.  08/15 follow up with primary care, found to have severe constipation. Bowel obstruction was ruled with abdominal radiographs. Unfortunately her symptoms continue to worsen despite bowel regimen with movantik    Early this am around 4 am, she had worsening abdominal pain and spontaneous vomiting of bile content. Small episode of watery stools. Because of severe of symptoms she called EMS. She was found in distress with heart rate up to 120 bpm, atrial fibrillation, then she was transported to the ED.   She has chronic constipation and at home uses several stool softeners and laxatives.    Review of Systems: As mentioned in the history of present illness. All other systems reviewed and are negative. Past Medical History:  Diagnosis Date   A-fib (HCC)    Anal fissure    resolved    Anemia    Back pain    with left radiculopathy   CAD (coronary artery disease)    Cataract    Collagen vascular disease (HCC)    DDD (degenerative disc disease)    Depression    Diabetes mellitus    Diverticulosis    Dyslipidemia    Dysrhythmia    AFib   Esophagitis    GERD (gastroesophageal reflux disease)    Hiatal hernia    Hx of peptic ulcer     Hyperlipidemia    Hypertension    Hypothyroidism    Internal hemorrhoids 2017   NSTEMI (non-ST elevated myocardial infarction) (HCC) 08/05/2015   Osteopenia    Sleep apnea    not using CPAP now, could not tolerate and PCP aware.   Thyroid  disease    Past Surgical History:  Procedure Laterality Date   ABDOMINAL HYSTERECTOMY Bilateral 2001 - approximate   APPENDECTOMY     BREAST REDUCTION SURGERY     CARDIAC CATHETERIZATION N/A 08/08/2015   Procedure: Left Heart Cath and Coronary Angiography;  Surgeon: Lonni JONETTA Cash, MD;  Location: [facility] INVASIVE CV LAB;  Service: Cardiovascular;  Laterality: N/A;   CATARACT EXTRACTION Bilateral    CHOLECYSTECTOMY  2008 - approximate   COLONOSCOPY  06/2007   Friable anal canal and anal papillae. Pancolonic diverticula. Normal terminal ileum. Status post segmental biopsy, descending colon biopsy showed minimal cryptitis. Biopsies felt to be  nonspecific but could be seen with NSAIDs. No features of inflammatory bowel disease. Stool studies were negative.   COLONOSCOPY  03/21/2011   RMR: colonic polyps treated as described above, colonic diverticulosis (Tubular adenomas)   COLONOSCOPY WITH PROPOFOL  N/A 06/17/2014   RMR: Colonic diverticulosis. Multiple colonic polyps removed as described above.    COLONOSCOPY WITH PROPOFOL  N/A 03/19/2016   Procedure: COLONOSCOPY WITH PROPOFOL ;  Surgeon: Lamar CHRISTELLA Hollingshead, MD;  Location: [facility];  Service: Endoscopy;  Laterality: N/A;  8:45am   COLONOSCOPY WITH PROPOFOL  N/A 10/02/2018  Procedure: COLONOSCOPY WITH PROPOFOL ;  Surgeon: Shaaron Lamar HERO, MD;  Location: [facility];  Service: Endoscopy;  Laterality: N/A;  9:15am   CORONARY ANGIOPLASTY WITH STENT PLACEMENT     4 stents   ESOPHAGEAL DILATION N/A 06/17/2014   Procedure: ESOPHAGEAL DILATION;  Surgeon: Lamar HERO Shaaron, MD;  Location: [facility];  Service: Endoscopy;  Laterality: N/AMERL Stai 54 Fr   ESOPHAGOGASTRODUODENOSCOPY  06/2007   Small hiatal hernia    ESOPHAGOGASTRODUODENOSCOPY  03/21/2011   RMR: normal esophagus- status post passage of Maloney dilator. Small hiatal hernia. Trivial antral and bulbar erosions   ESOPHAGOGASTRODUODENOSCOPY (EGD) WITH PROPOFOL  N/A 06/17/2014   RMR: Small hiatal hernia; otherwise normal EGD. Status post passage of a Maloney dilator. No explanation for patients right upper quadrant abdominal pain.    ESOPHAGOGASTRODUODENOSCOPY (EGD) WITH PROPOFOL  N/A 03/19/2016   Procedure: ESOPHAGOGASTRODUODENOSCOPY (EGD) WITH PROPOFOL ;  Surgeon: Lamar HERO Shaaron, MD;  [facility];  Service: Endoscopy;  Laterality: N/A;   ESOPHAGOGASTRODUODENOSCOPY (EGD) WITH PROPOFOL  N/A 10/12/2022   Procedure: ESOPHAGOGASTRODUODENOSCOPY (EGD) WITH PROPOFOL ;  Surgeon: Shaaron Lamar HERO, MD;  [facility];  Service: Endoscopy;  Laterality: N/A;   FLEXIBLE SIGMOIDOSCOPY N/A 10/12/2022   Procedure: FLEXIBLE SIGMOIDOSCOPY;  Surgeon: Shaaron Lamar HERO, MD;  [facility];  Service: Endoscopy;  Laterality: N/A;   KNEE SURGERY Left    arthroscopy   LIPOMA EXCISION  03/17/2012   Procedure: EXCISION LIPOMA;  Surgeon: Lynwood MALVA Pina, MD;  Location: [facility];  Service: General;  Laterality: Right;  excision of right lower back lipoma   MALONEY DILATION N/A 03/19/2016   Procedure: STAI DILATION;  Surgeon: Lamar HERO Shaaron, MD;  [facility];  Service: Endoscopy;  Laterality: N/A;   MALONEY DILATION N/A 10/12/2022   Procedure: STAI DILATION;  Surgeon: Shaaron Lamar HERO, MD;  Location: [facility];  Service: Endoscopy;  Laterality: N/A;   PATELLA FRACTURE SURGERY Right    POLYPECTOMY  06/17/2014   Procedure: POLYPECTOMY;  Surgeon: Lamar HERO Shaaron, MD;  Location: [facility];  Service: Endoscopy;;   POLYPECTOMY  10/02/2018   Procedure: POLYPECTOMY;  Surgeon: Shaaron Lamar HERO, MD;  Location[facility];  Service: Endoscopy;;  colon   REDUCTION MAMMAPLASTY Bilateral    Social History:  reports that she has  never smoked. She has never used smokeless tobacco. She reports that she does not currently use alcohol after a past usage of about 1.0 standard drink of alcohol per week. She reports that she does not use drugs.  Allergies  Allergen Reactions   Fentanyl  Anaphylaxis   Promethazine  Hcl Anaphylaxis   Jardiance  [Empagliflozin ]     Vaginal burning/raw   Metformin  And Related     Leg pain   Farxiga  [Dapagliflozin ] Other (See Comments)    Recurrent yeast infections    Family History  Problem Relation Age of Onset   Heart attack Mother    Hypertension Mother    Stroke Mother        Deceased, age 66   Diabetes Sister    Arthritis Sister    Diabetes Sister    Cancer Sister        ovarian   Stroke Sister    Alzheimer's disease Sister    Diabetes Brother        also has a blood disorder    Clotting disorder Brother    Kidney disease Brother    Heart disease Brother    Melanoma Brother  deceased   Diabetes Son    Hypertension Son    Coronary artery disease Son 88       CABG   Stroke Son    Hypertension Son    Coronary artery disease Son 43       CABG   Colon cancer Neg Hx    Breast cancer Neg Hx     Prior to Admission medications   Medication Sig Start Date End Date Taking? Authorizing Provider  acetaminophen  (TYLENOL ) 650 MG CR tablet Take 650 mg by mouth every 8 (eight) hours as needed for pain.    [provider]  ALPRAZolam  (XANAX ) 0.5 MG tablet Take 1 tablet (0.5 mg total) by mouth daily as needed. 10/18/23   Lavell Lye A, FNP  Baclofen  5 MG TABS Take 1 tablet (5 mg total) by mouth 3 (three) times daily as needed. 10/18/23   Lavell Lye A, FNP  busPIRone  (BUSPAR ) 5 MG tablet Take 5 mg by mouth 3 (three) times daily.    [provider]  diclofenac  (VOLTAREN ) 75 MG EC tablet Take 1 tablet (75 mg total) by mouth 2 (two) times daily. 09/23/23   Lavell Lye LABOR, FNP  hydroxychloroquine  (PLAQUENIL ) 200 MG tablet TAKE ONE (1) TABLET BY MOUTH EVERY  DAY 12/12/20   Rice, Lonni ORN, MD  isosorbide  mononitrate (IMDUR ) 60 MG 24 hr tablet Take 60 mg by mouth daily.    [provider]  lactulose  (CHRONULAC ) 10 GM/15ML solution Take 15 mLs (10 g total) by mouth daily as needed for mild constipation or moderate constipation. 10/18/23   Lavell Lye LABOR, FNP  levothyroxine  (SYNTHROID ) 50 MCG tablet TAKE ONE (1) TABLET EACH DAY 06/17/23   Lavell Lye A, FNP  linaclotide  (LINZESS ) 290 MCG CAPS capsule Take 1 capsule (290 mcg total) by mouth daily before breakfast. 10/18/23   Lavell Lye LABOR, FNP  lisinopril  (ZESTRIL ) 10 MG tablet TAKE ONE (1) TABLET EACH DAY 09/19/23   Hawks, Lye A, FNP  meclizine  (ANTIVERT ) 12.5 MG tablet Take 1 tablet (12.5 mg total) by mouth 3 (three) times daily as needed for dizziness (Vertigo). 08/01/23   Dettinger, Fonda LABOR, MD  metoprolol  tartrate (LOPRESSOR ) 50 MG tablet Take 1 tablet (50 mg total) by mouth 2 (two) times daily. 10/18/23   Lavell Lye LABOR, FNP  naloxegol  oxalate (MOVANTIK ) 12.5 MG TABS tablet Take 1 tablet (12.5 mg total) by mouth daily. To replace linzess  11/22/23   Jolinda Potter M, DO  nitroGLYCERIN  (NITROSTAT ) 0.4 MG SL tablet Place 1 tablet (0.4 mg total) under the tongue every 5 (five) minutes as needed for chest pain. 11/21/23   Lavell Lye LABOR, FNP  pantoprazole  (PROTONIX ) 40 MG tablet Take 1 tablet (40 mg total) by mouth 2 (two) times daily. 10/18/23   Lavell Lye A, FNP  polyethylene glycol (MIRALAX  / GLYCOLAX ) 17 g packet Take 17 g by mouth daily.    [provider]  pravastatin  (PRAVACHOL ) 80 MG tablet Take 1 tablet (80 mg total) by mouth every evening. 10/18/23   Lavell Lye A, FNP  Semaglutide  (RYBELSUS ) 7 MG TABS Take 1 tablet (7 mg total) by mouth daily. 07/18/23   Lavell Lye LABOR, FNP  traMADol  (ULTRAM ) 50 MG tablet Take 1 tablet (50 mg total) by mouth every 8 (eight) hours as needed. 10/18/23   Lavell Lye A, FNP  Wheat Dextrin (BENEFIBER PO) Take 1 Dose by mouth  daily.    [provider]    Physical Exam: Vitals:   11/25/23  1121 11/25/23 1122 11/25/23 1123  BP: 124/82    Pulse:   (!) 107  Resp:  19   Temp:  97.6 F (36.4 C)   TempSrc:  Oral   SpO2:   96%  Weight:   72.1 kg  Height:   5' 2 (1.575 m)   BP (!) 155/95   Pulse (!) 111   Temp 97.6 F (36.4 C) (Oral)   Resp 19   Ht 5' 2 (1.575 m)   Wt 72.1 kg   SpO2 90%   BMI 29.07 kg/m   Neurology awake and alert, deconditioned ENT with mild pallor, dry mucous membranes Cardiovascular with S1 and S2 present, irregularly irregular with no gallops, rubs or murmurs Respiratory with no rales or wheezing, no rhonchi  Abdomen with distention, soft, mild tender to deep palpation with no rebound or guarding.  No lower extremity edema   Data Reviewed:   Na 139, K 4.4 Cl 102 bicarbonate 23 glucose 143, bun 22 cr 1,0  ALK 154  Lipase 28  AST 20 ALT 18  Wbc 6,2 hgb 13,3 plt 198   CT abdomen and pelvis with disproportionate dilatation of multiple small bowel loops without discrete transition point, possible enteritis or adynamic ileus.  There is liquid unformed stool in the colon No pneumoperitoneum, pneumatosis or walled off abscess.   Assessment and Plan: * SBO (small bowel obstruction) (HCC) Continue conservative medical therapy.  Bowel rest, except sips with meds.  IV fluids with balanced electrolyte solution. IV antiacids and as needed antiemetics/ analgesics  Keep K at 4 and Mg at 2  Hold on antibiotic therapy for now.  Follow up with GI recommendations  Essential hypertension Continue blood pressure monitoring  Hold on lisinopril  and isosorbide  for now due to risk of hypotension.   Paroxysmal atrial fibrillation (HCC) Continue rate control with metoprolol   Patient has been off anticoagulation   Coronary artery disease No chest pain, no acute coronary syndrome.  Continue blood pressure monitoring   Hypothyroidism Continue with levothyroxine    Type 2  diabetes mellitus with hyperlipidemia (HCC) Continue glucose cover and monitoring with insulin  sliding scale Continue with statin   Depression Continue with as needed alprazolam , and scheduled buspirone    Gastroesophageal reflux Continue pantoprazole    Erosive osteoarthritis of both hands Patient on hydroxychloroquine  and diclofenac  at home       Advance Care Planning:   Code Status: Full Code   Consults: GI   Family Communication: no family at the bedside   Severity of Illness: The appropriate patient status for this patient is INPATIENT. Inpatient status is judged to be reasonable and necessary in order to provide the required intensity of service to ensure the patient's safety. The patient's presenting symptoms, physical exam findings, and initial radiographic and laboratory data in the context of their chronic comorbidities is felt to place them at high risk for further clinical deterioration. Furthermore, it is not anticipated that the patient will be medically stable for discharge from the hospital within 2 midnights of admission.   * I certify that at the point of admission it is my clinical judgment that the patient will require inpatient hospital care spanning beyond 2 midnights from the point of admission due to high intensity of service, high risk for further deterioration and high frequency of surveillance required.*  Author: Elidia Toribio Furnace, MD 11/25/2023 2:40 PM  For on call review [email or web address].

## 2023-11-25 NOTE — Plan of Care (Signed)
   Problem: Education: Goal: Knowledge of General Education information will improve Description Including pain rating scale, medication(s)/side effects and non-pharmacologic comfort measures Outcome: Progressing

## 2023-11-25 NOTE — Assessment & Plan Note (Signed)
 Continue blood pressure monitoring  Hold on lisinopril  and isosorbide  for now due to risk of hypotension.

## 2023-11-25 NOTE — Assessment & Plan Note (Signed)
 Patient on hydroxychloroquine  and diclofenac  at home

## 2023-11-25 NOTE — Assessment & Plan Note (Signed)
 Continue pantoprazole .

## 2023-11-26 DIAGNOSIS — K5900 Constipation, unspecified: Secondary | ICD-10-CM

## 2023-11-26 DIAGNOSIS — R748 Abnormal levels of other serum enzymes: Secondary | ICD-10-CM

## 2023-11-26 DIAGNOSIS — R1915 Other abnormal bowel sounds: Secondary | ICD-10-CM

## 2023-11-26 DIAGNOSIS — K869 Disease of pancreas, unspecified: Secondary | ICD-10-CM | POA: Diagnosis not present

## 2023-11-26 DIAGNOSIS — R1084 Generalized abdominal pain: Secondary | ICD-10-CM | POA: Diagnosis not present

## 2023-11-26 DIAGNOSIS — I480 Paroxysmal atrial fibrillation: Secondary | ICD-10-CM | POA: Diagnosis not present

## 2023-11-26 DIAGNOSIS — R110 Nausea: Secondary | ICD-10-CM

## 2023-11-26 DIAGNOSIS — K560 Paralytic ileus: Secondary | ICD-10-CM

## 2023-11-26 DIAGNOSIS — R112 Nausea with vomiting, unspecified: Secondary | ICD-10-CM

## 2023-11-26 DIAGNOSIS — K56609 Unspecified intestinal obstruction, unspecified as to partial versus complete obstruction: Secondary | ICD-10-CM | POA: Diagnosis not present

## 2023-11-26 DIAGNOSIS — E039 Hypothyroidism, unspecified: Secondary | ICD-10-CM | POA: Diagnosis not present

## 2023-11-26 LAB — CBC
HCT: 34.9 % — ABNORMAL LOW (ref 36.0–46.0)
Hemoglobin: 11.4 g/dL — ABNORMAL LOW (ref 12.0–15.0)
MCH: 32.4 pg (ref 26.0–34.0)
MCHC: 32.7 g/dL (ref 30.0–36.0)
MCV: 99.1 fL (ref 80.0–100.0)
Platelets: 184 K/uL (ref 150–400)
RBC: 3.52 MIL/uL — ABNORMAL LOW (ref 3.87–5.11)
RDW: 14.8 % (ref 11.5–15.5)
WBC: 6.6 K/uL (ref 4.0–10.5)
nRBC: 0 % (ref 0.0–0.2)

## 2023-11-26 LAB — HEPATIC FUNCTION PANEL
ALT: 15 U/L (ref 0–44)
AST: 17 U/L (ref 15–41)
Albumin: 2.9 g/dL — ABNORMAL LOW (ref 3.5–5.0)
Alkaline Phosphatase: 111 U/L (ref 38–126)
Bilirubin, Direct: 0.3 mg/dL — ABNORMAL HIGH (ref 0.0–0.2)
Indirect Bilirubin: 0.8 mg/dL (ref 0.3–0.9)
Total Bilirubin: 1.1 mg/dL (ref 0.0–1.2)
Total Protein: 6.1 g/dL — ABNORMAL LOW (ref 6.5–8.1)

## 2023-11-26 LAB — BASIC METABOLIC PANEL WITH GFR
Anion gap: 8 (ref 5–15)
BUN: 17 mg/dL (ref 8–23)
CO2: 25 mmol/L (ref 22–32)
Calcium: 8.7 mg/dL — ABNORMAL LOW (ref 8.9–10.3)
Chloride: 104 mmol/L (ref 98–111)
Creatinine, Ser: 0.96 mg/dL (ref 0.44–1.00)
GFR, Estimated: 59 mL/min — ABNORMAL LOW (ref >60)
Glucose, Bld: 147 mg/dL — ABNORMAL HIGH (ref 70–99)
Potassium: 4 mmol/L (ref 3.5–5.1)
Sodium: 137 mmol/L (ref 135–145)

## 2023-11-26 LAB — GLUCOSE, CAPILLARY
Glucose-Capillary: 119 mg/dL — ABNORMAL HIGH (ref 70–99)
Glucose-Capillary: 159 mg/dL — ABNORMAL HIGH (ref 70–99)
Glucose-Capillary: 112 mg/dL — ABNORMAL HIGH (ref 70–99)
Glucose-Capillary: 115 mg/dL — ABNORMAL HIGH (ref 70–99)

## 2023-11-26 MED ORDER — MORPHINE SULFATE (PF) 2 MG/ML IV SOLN
1.0000 mg | INTRAVENOUS | Status: DC | PRN
  Administered 2023-11-28: 1 mg via INTRAVENOUS
  Filled 2023-11-26: qty 1

## 2023-11-26 MED ORDER — POLYETHYLENE GLYCOL 3350 17 G PO PACK
17.0000 g | PACK | Freq: Two times a day (BID) | ORAL | Status: DC
  Administered 2023-11-26 – 2023-11-28 (×6): 17 g via ORAL
  Filled 2023-11-26 (×6): qty 1

## 2023-11-26 NOTE — TOC CM/SW Note (Signed)
 Transition of Care [facility]) - Inpatient Brief Assessment   Patient Details  Name: Rhonda Garcia MRN: [id number] Date of Birth: 05-31-1940  [facility] CM/SW Contact:    Lucie Lunger, LCSWA Phone Number: 11/26/2023, 8:56 AM   Clinical Narrative: Transition of Care Department [facility]) has reviewed patient and no TOC needs have been identified at this time. We will continue to monitor patient advancement through interdiciplinary progression rounds. If new patient transition needs arise, please place a TOC consult.  Transition of Care Asessment: Insurance and Status: Insurance coverage has been reviewed Patient has primary care physician: Yes Home environment has been reviewed: From home Prior level of function:: Independent Prior/Current Home Services: No current home services Social Drivers of Health Review: SDOH reviewed no interventions necessary Readmission risk has been reviewed: Yes Transition of care needs: no transition of care needs at this time

## 2023-11-26 NOTE — Plan of Care (Signed)

## 2023-11-26 NOTE — Progress Notes (Signed)
 PROGRESS NOTE   Rhonda Garcia  FMW:[id number] DOB: [birth year] DOA: 11/25/2023 PCP: Lavell Bari LABOR, FNP   Chief Complaint  Patient presents with   Constipation   Level of care: Telemetry  Brief Admission History:   83 y.o. female with medical history significant of atrial fibrillation, chronic anemia, coronary artery disease, T2DM, dyslipidemia, GERD, diverticulosis, and hypothyroidism who presented with abdominal pain.     Reported one week of not able to move her bowels, associated with progressive abdominal distention and lower abdominal pain. Very poor appetite with very poor oral intake, mainly drinking water , and having postprandial vomiting.  08/15 follow up with primary care, found to have severe constipation. Bowel obstruction was ruled with abdominal radiographs. Unfortunately her symptoms continue to worsen despite bowel regimen with movantik     Early this am around 4 am, she had worsening abdominal pain and spontaneous vomiting of bile content. Small episode of watery stools. Because of severe of symptoms she called EMS. She was found in distress with heart rate up to 120 bpm, atrial fibrillation, then she was transported to the ED.    She has chronic constipation and at home uses several stool softeners and laxatives.     Assessment and Plan:  Obstipation  Continue conservative medical therapy. Miralax  17 grams BID  IV fluids IV antiacids and as needed antiemetics/ analgesics  Keep K at 4 and Mg at 2  Hold on antibiotic therapy for now.  Follow up with GI recommendations  Erosive osteoarthritis of both hands Patient on hydroxychloroquine  and diclofenac  at home   Coronary artery disease No chest pain, no acute coronary syndrome.  Continue blood pressure monitoring   Hypothyroidism Continue with levothyroxine    Paroxysmal atrial fibrillation  Continue rate control with metoprolol   Patient has been off anticoagulation   Depression Continue with as  needed alprazolam , and scheduled buspirone    Gastroesophageal reflux Continue pantoprazole    Essential hypertension Continue blood pressure monitoring  Hold on lisinopril  and isosorbide  for now due to risk of hypotension.   Type 2 diabetes mellitus with hyperlipidemia Continue glucose cover and monitoring with insulin  sliding scale Continue with statin  CBG (last 3)  Recent Labs    11/25/23 2055 11/26/23 0721 11/26/23 1125  GLUCAP 149* 119* 159*    DVT prophylaxis: enoxaparin   Code Status: Full  Family Communication:  Disposition:    Consultants:  GI Procedures:   Antimicrobials:    Subjective: Pt having watery loose stool output on laxative therapy.   Objective: Vitals:   11/25/23 1518 11/25/23 2051 11/26/23 0428 11/26/23 1224  BP: (!) 172/101 (!) 149/80 134/75 135/76  Pulse: 88 (!) 108 92 89  Resp: 20 20 18    Temp: 97.9 F (36.6 C) 98.9 F (37.2 C) 98.2 F (36.8 C) (!) 97.4 F (36.3 C)  TempSrc: Oral Oral Oral Oral  SpO2: 98% 96% 92% 98%  Weight: 70.2 kg     Height: 5' 2 (1.575 m)       Intake/Output Summary (Last 24 hours) at 11/26/2023 1502 Last data filed at 11/26/2023 0002 Gross per 24 hour  Intake 1179.31 ml  Output --  Net 1179.31 ml   Filed Weights   11/25/23 1123 11/25/23 1518  Weight: 72.1 kg 70.2 kg   Examination:  General exam: Appears calm and comfortable  Respiratory system: Clear to auscultation. Respiratory effort normal. Cardiovascular system: normal S1 & S2 heard. No JVD, murmurs, rubs, gallops or clicks. No pedal edema. Gastrointestinal system: Abdomen is nondistended, soft and nontender.  No organomegaly or masses felt. Normal bowel sounds heard. Central nervous system: Alert and oriented. No focal neurological deficits. Extremities: Symmetric 5 x 5 power. Skin: No rashes, lesions or ulcers. Psychiatry: Judgement and insight appear normal. Mood & affect appropriate.   Data Reviewed: I have personally reviewed following labs  and imaging studies  CBC: Recent Labs  Lab 11/25/23 1141 11/26/23 0421  WBC 6.2 6.6  NEUTROABS 4.9  --   HGB 13.3 11.4*  HCT 40.4 34.9*  MCV 98.8 99.1  PLT 198 184    Basic Metabolic Panel: Recent Labs  Lab 11/25/23 1141 11/26/23 0421  NA 139 137  K 4.4 4.0  CL 102 104  CO2 23 25  GLUCOSE 143* 147*  BUN 22 17  CREATININE 1.07* 0.96  CALCIUM  9.8 8.7*    CBG: Recent Labs  Lab 11/25/23 1618 11/25/23 2055 11/26/23 0721 11/26/23 1125  GLUCAP 109* 149* 119* 159*    No results found for this or any previous visit (from the past 240 hours).   Radiology Studies: CT ABDOMEN PELVIS W CONTRAST Result Date: 11/25/2023 CLINICAL DATA:  Bowel obstruction suspected. EXAM: CT ABDOMEN AND PELVIS WITH CONTRAST TECHNIQUE: Multidetector CT imaging of the abdomen and pelvis was performed using the standard protocol following bolus administration of intravenous contrast. RADIATION DOSE REDUCTION: This exam was performed according to the departmental dose-optimization program which includes automated exposure control, adjustment of the mA and/or kV according to patient size and/or use of iterative reconstruction technique. CONTRAST:  OMNIPAQUE  IOHEXOL  300 MG/ML  SOLN COMPARISON:  CT scan abdomen and pelvis from 06/20/2023. FINDINGS: Lower chest: There are patchy atelectatic changes in the visualized lung bases. No overt consolidation. No pleural effusion. The heart is normal in size. No pericardial effusion. Hepatobiliary: The liver is normal in size. Non-cirrhotic configuration. No suspicious mass. No intrahepatic bile duct dilation. There is mild prominence of the extrahepatic bile duct, most likely due to post cholecystectomy status. Gallbladder is surgically absent. Pancreas: There are 2 adjacent hypoattenuating lesions in the tail with larger lesion measuring up to 7 x 9 mm. These are incompletely characterized on the current exam but appears grossly similar to the prior study. 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 Further evaluation with nonemergent MRI abdomen/MRCP protocol is recommended. The pancreas is otherwise unremarkable. There is mild-to-moderate diffuse atrophy/fatty replacement. No peripancreatic fat stranding. Spleen: Within normal limits. No focal lesion. Adrenals/Urinary Tract: Adrenal glands are unremarkable. No suspicious renal mass. There is a stable sinus cysts in the right kidney upper pole. No nephroureterolithiasis or obstructive uropathy on either side. Urinary bladder is under distended, precluding optimal assessment. However, no large mass or stones identified. No perivesical fat stranding. Stomach/Bowel: The appendix was not visualized; however there is no acute inflammatory process in the right lower quadrant. The stomach is distended with fluid and air. The duodenal and proximal jejunal bowel loops are nondilated. However, there is disproportionate dilation of multiple small bowel loops measuring in diameter up to 3.6 cm. There is no associated abnormal bowel wall thickening. However, there is mild perivesical fat stranding and prominence of vasa recta. No discrete transition point. Distal multiple small bowel loops are nondilated. There is liquid unformed stool in the colon. There are multiple colonic diverticula mainly in the sigmoid/distal descending colon without diverticulitis. Vascular/Lymphatic: There is trace ascites in the dependent pelvis. No walled-off abscess or loculated collection. No pneumoperitoneum. No abdominal or pelvic lymphadenopathy, by size criteria. No aneurysmal dilation of the major abdominal  arteries. There are moderate peripheral atherosclerotic vascular calcifications of the aorta and its major branches. Reproductive: The uterus is surgically absent. No large adnexal mass. Other: There is a tiny fat containing umbilical hernia. The soft tissues and abdominal wall are otherwise unremarkable. Musculoskeletal: No  suspicious osseous lesions. There are mild multilevel degenerative changes in the visualized spine. IMPRESSION: 1. There is disproportionate dilation of multiple small bowel loops without discrete transition point. Findings favor enteritis/adynamic ileus. 2. There is liquid unformed stool in the colon, nonspecific but can be seen with diarrhea. 3. No pneumoperitoneum, pneumatosis or walled-off abscess. 4. Multiple other nonacute observations, as described above. Aortic Atherosclerosis (ICD10-I70.0). Electronically Signed   By: Ree Molt M.D.   On: 11/25/2023 13:12    Scheduled Meds:  busPIRone   5 mg Oral TID   enoxaparin  (LOVENOX ) injection  40 mg Subcutaneous Q24H   hydroxychloroquine   200 mg Oral Daily   insulin  aspart  0-9 Units Subcutaneous TID WC   levothyroxine   50 mcg Oral Q0600   [START ON 11/27/2023] linaclotide   290 mcg Oral QAC breakfast   metoprolol  tartrate  50 mg Oral BID   pantoprazole  (PROTONIX ) IV  40 mg Intravenous Q24H   polyethylene glycol  17 g Oral BID   pravastatin   80 mg Oral QPM   Continuous Infusions:  lactated ringers  75 mL/hr at 11/26/23 0514     LOS: 1 day   Time spent: 55 mins  Assad Harbeson Vicci, MD How to contact the [facility] Attending or Consulting provider 7A - 7P or covering provider during after hours 7P -7A, for this patient?  Check the care team in [facility] and look for a) attending/consulting [facility] provider listed and b) the [facility] team listed Log into [email or web address] to find provider on call.  Locate the [facility] provider you are looking for under [facility] and page to a number that you can be directly reached. If you still have difficulty reaching the provider, please page the [facility] (Director on Call) for the Hospitalists listed on amion for assistance.  11/26/2023, 3:02 PM

## 2023-11-26 NOTE — Plan of Care (Signed)
 ?  Problem: Coping: ?Goal: Level of anxiety will decrease ?Outcome: Progressing ?  ?Problem: Safety: ?Goal: Ability to remain free from injury will improve ?Outcome: Progressing ?  ?

## 2023-11-26 NOTE — Plan of Care (Signed)
   Problem: Activity: Goal: Risk for activity intolerance will decrease Outcome: Progressing   Problem: Coping: Goal: Level of anxiety will decrease Outcome: Progressing

## 2023-11-26 NOTE — Hospital Course (Signed)
 83 y.o. female with medical history significant of atrial fibrillation, chronic anemia, coronary artery disease, T2DM, dyslipidemia, GERD, diverticulosis, and hypothyroidism who presented with abdominal pain.     Reported one week of not able to move her bowels, associated with progressive abdominal distention and lower abdominal pain. Very poor appetite with very poor oral intake, mainly drinking water , and having postprandial vomiting.  08/15 follow up with primary care, found to have severe constipation. Bowel obstruction was ruled with abdominal radiographs. Unfortunately her symptoms continue to worsen despite bowel regimen with movantik     Early this am around 4 am, she had worsening abdominal pain and spontaneous vomiting of bile content. Small episode of watery stools. Because of severe of symptoms she called EMS. She was found in distress with heart rate up to 120 bpm, atrial fibrillation, then she was transported to the ED.    She has chronic constipation and at home uses several stool softeners and laxatives.

## 2023-11-26 NOTE — Progress Notes (Signed)
 Subjective: Reports she is doing a little better today, but not back to her normal.  Took 6 doses of MiraLAX  last night.  Has had a total of 6 liquid bowel movements.  States it was like dirty water  initially and then most recently it was just light yellow water .  Only passed 1 small piece of stool.  Continues to have some mild nausea with liquid intake, but this has improved since yesterday.  Abdomen is still slightly bloated, but also improved.  No BRBPR or melena.  Reports she only takes tramadol  when absolutely necessary.  States she probably has not taken it in a couple of months.   Objective: Vital signs in last 24 hours: Temp:  [97.4 F (36.3 C)-98.9 F (37.2 C)] 97.4 F (36.3 C) (08/19 1224) Pulse Rate:  [88-116] 89 (08/19 1224) Resp:  [14-20] 18 (08/19 0428) BP: (134-172)/(75-101) 135/76 (08/19 1224) SpO2:  [92 %-98 %] 98 % (08/19 1224) Weight:  [70.2 kg] 70.2 kg (08/18 1518)   General:   Alert and oriented, pleasant, NAD.  Head:  Normocephalic and atraumatic. Eyes:  No icterus, sclera clear. Conjuctiva pink.  Abdomen:  Bowel sounds hypoactive.  Abdomen is full, but soft and nontender. No rebound or guarding.  Msk:  Symmetrical without gross deformities. Normal posture. Extremities:  Without edema. Neurologic:  Alert and  oriented x4;  grossly normal neurologically. Psych: Normal mood and affect.  Intake/Output from previous day: 08/18 0701 - 08/19 0700 In: 1179.3 [P.O.:360; I.V.:819.3] Out: -  Intake/Output this shift: No intake/output data recorded.  Lab Results: Recent Labs    11/25/23 1141 11/26/23 0421  WBC 6.2 6.6  HGB 13.3 11.4*  HCT 40.4 34.9*  PLT 198 184   BMET Recent Labs    11/25/23 1141 11/26/23 0421  NA 139 137  K 4.4 4.0  CL 102 104  CO2 23 25  GLUCOSE 143* 147*  BUN 22 17  CREATININE 1.07* 0.96  CALCIUM  9.8 8.7*   LFT Recent Labs    11/25/23 1141 11/26/23 0421  PROT 7.8 6.1*  ALBUMIN 3.8 2.9*  AST 20 17  ALT 18 15   ALKPHOS 154* 111  BILITOT 1.5* 1.1  BILIDIR  --  0.3*  IBILI  --  0.8    Studies/Results: CT ABDOMEN PELVIS W CONTRAST Result Date: 11/25/2023 CLINICAL DATA:  Bowel obstruction suspected. EXAM: CT ABDOMEN AND PELVIS WITH CONTRAST TECHNIQUE: Multidetector CT imaging of the abdomen and pelvis was performed using the standard protocol following bolus administration of intravenous contrast. RADIATION DOSE REDUCTION: This exam was performed according to the departmental dose-optimization program which includes automated exposure control, adjustment of the mA and/or kV according to patient size and/or use of iterative reconstruction technique. CONTRAST:  OMNIPAQUE  IOHEXOL  300 MG/ML  SOLN COMPARISON:  CT scan abdomen and pelvis from 06/20/2023. FINDINGS: Lower chest: There are patchy atelectatic changes in the visualized lung bases. No overt consolidation. No pleural effusion. The heart is normal in size. No pericardial effusion. Hepatobiliary: The liver is normal in size. Non-cirrhotic configuration. No suspicious mass. No intrahepatic bile duct dilation. There is mild prominence of the extrahepatic bile duct, most likely due to post cholecystectomy status. Gallbladder is surgically absent. Pancreas: There are 2 adjacent hypoattenuating lesions in the tail with larger lesion measuring up to 7 x 9 mm. These are incompletely characterized on the current exam but appears grossly similar to the prior study. 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 Further evaluation with nonemergent MRI  abdomen/MRCP protocol is recommended. The pancreas is otherwise unremarkable. There is mild-to-moderate diffuse atrophy/fatty replacement. No peripancreatic fat stranding. Spleen: Within normal limits. No focal lesion. Adrenals/Urinary Tract: Adrenal glands are unremarkable. No suspicious renal mass. There is a stable sinus cysts in the right kidney upper pole. No nephroureterolithiasis or obstructive  uropathy on either side. Urinary bladder is under distended, precluding optimal assessment. However, no large mass or stones identified. No perivesical fat stranding. Stomach/Bowel: The appendix was not visualized; however there is no acute inflammatory process in the right lower quadrant. The stomach is distended with fluid and air. The duodenal and proximal jejunal bowel loops are nondilated. However, there is disproportionate dilation of multiple small bowel loops measuring in diameter up to 3.6 cm. There is no associated abnormal bowel wall thickening. However, there is mild perivesical fat stranding and prominence of vasa recta. No discrete transition point. Distal multiple small bowel loops are nondilated. There is liquid unformed stool in the colon. There are multiple colonic diverticula mainly in the sigmoid/distal descending colon without diverticulitis. Vascular/Lymphatic: There is trace ascites in the dependent pelvis. No walled-off abscess or loculated collection. No pneumoperitoneum. No abdominal or pelvic lymphadenopathy, by size criteria. No aneurysmal dilation of the major abdominal arteries. There are moderate peripheral atherosclerotic vascular calcifications of the aorta and its major branches. Reproductive: The uterus is surgically absent. No large adnexal mass. Other: There is a tiny fat containing umbilical hernia. The soft tissues and abdominal wall are otherwise unremarkable. Musculoskeletal: No suspicious osseous lesions. There are mild multilevel degenerative changes in the visualized spine. IMPRESSION: 1. There is disproportionate dilation of multiple small bowel loops without discrete transition point. Findings favor enteritis/adynamic ileus. 2. There is liquid unformed stool in the colon, nonspecific but can be seen with diarrhea. 3. No pneumoperitoneum, pneumatosis or walled-off abscess. 4. Multiple other nonacute observations, as described above. Aortic Atherosclerosis (ICD10-I70.0).  Electronically Signed   By: Ree Molt M.D.   On: 11/25/2023 13:12    Assessment: 83 y.o. year old female with a history of afib, anemia, CAD, DM, dyslipidemia, hypothyroidism, chronic constipation, adenomatous colon polyps, GERD, presenting to the ED today with abdominal pain, vomiting, constipation. Admitted with constipation/obstipation and CT showing enteritis vs adynamic ileus, liquid unformed stool in colon. GI consulted to assist with management.   Constipation/obstipation:  CT findings of likely adynamic ileus.  This may be related to daily Rybelsus .  She does take tramadol , but this is very sparingly and reports none in the last 1 to 2 months.  TSH wnl. Now s/p 6 doses of MiraLAX  last night in efforts to get her bowels moving.  She has had a total of 6 liquid bowel movements so far and does note some mild improvement in her symptoms, but still has some mild nausea and mild abdominal bloating.  Abdominal exam today with hypoactive bowel sounds, full but soft abdomen that is nontender to palpation.  Recommend that we continue with MiraLAX  today.  Can consider resuming home Linzess  once bowels are moving well.  PCP recently recommended Movantik  in the outpatient setting, but this would not be appropriate if she is not taking tramadol  on a regular basis.  She may benefit from [employer].  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 Can follow-up as outpatient with MRI.    Elevated alk phos:  154 today yesterday. Normalized today. Non-specific.    Plan: Continue with MiraLAX  17g BID today.  Can resume home Linzess  290 mcg daily once bowels are moving well and she  is clinically feeling improved. May very well benefit from starting Motegrity. Continue clear liquids today.  Reassess in the morning. Outpatient MRI to follow-up on pancreatic lesions.    LOS: 1 day    11/26/2023, 12:32 PM   [facility]ette [facility], [facility]

## 2023-11-27 DIAGNOSIS — K56609 Unspecified intestinal obstruction, unspecified as to partial versus complete obstruction: Secondary | ICD-10-CM | POA: Diagnosis not present

## 2023-11-27 DIAGNOSIS — K560 Paralytic ileus: Secondary | ICD-10-CM | POA: Diagnosis not present

## 2023-11-27 DIAGNOSIS — I480 Paroxysmal atrial fibrillation: Secondary | ICD-10-CM | POA: Diagnosis not present

## 2023-11-27 LAB — BASIC METABOLIC PANEL WITH GFR
Anion gap: 9 (ref 5–15)
BUN: 11 mg/dL (ref 8–23)
CO2: 25 mmol/L (ref 22–32)
Calcium: 8.7 mg/dL — ABNORMAL LOW (ref 8.9–10.3)
Chloride: 106 mmol/L (ref 98–111)
Creatinine, Ser: 0.86 mg/dL (ref 0.44–1.00)
GFR, Estimated: >60 mL/min (ref >60)
Glucose, Bld: 121 mg/dL — ABNORMAL HIGH (ref 70–99)
Potassium: 4.1 mmol/L (ref 3.5–5.1)
Sodium: 140 mmol/L (ref 135–145)

## 2023-11-27 LAB — GLUCOSE, CAPILLARY
Glucose-Capillary: 107 mg/dL — ABNORMAL HIGH (ref 70–99)
Glucose-Capillary: 136 mg/dL — ABNORMAL HIGH (ref 70–99)
Glucose-Capillary: 116 mg/dL — ABNORMAL HIGH (ref 70–99)
Glucose-Capillary: 96 mg/dL (ref 70–99)

## 2023-11-27 LAB — CBC
HCT: 33.6 % — ABNORMAL LOW (ref 36.0–46.0)
Hemoglobin: 11 g/dL — ABNORMAL LOW (ref 12.0–15.0)
MCH: 32.4 pg (ref 26.0–34.0)
MCHC: 32.7 g/dL (ref 30.0–36.0)
MCV: 98.8 fL (ref 80.0–100.0)
Platelets: 184 K/uL (ref 150–400)
RBC: 3.4 MIL/uL — ABNORMAL LOW (ref 3.87–5.11)
RDW: 14.6 % (ref 11.5–15.5)
WBC: 4.7 K/uL (ref 4.0–10.5)
nRBC: 0 % (ref 0.0–0.2)

## 2023-11-27 LAB — MAGNESIUM: Magnesium: 1.4 mg/dL — ABNORMAL LOW (ref 1.7–2.4)

## 2023-11-27 MED ORDER — ISOSORBIDE MONONITRATE ER 30 MG PO TB24
30.0000 mg | ORAL_TABLET | Freq: Every day | ORAL | Status: DC
  Administered 2023-11-27 – 2023-11-29 (×3): 30 mg via ORAL
  Filled 2023-11-27 (×3): qty 1

## 2023-11-27 NOTE — Plan of Care (Signed)
   Problem: Activity: Goal: Risk for activity intolerance will decrease Outcome: Progressing   Problem: Coping: Goal: Level of anxiety will decrease Outcome: Progressing

## 2023-11-27 NOTE — Progress Notes (Signed)
 PROGRESS NOTE  Rhonda Garcia FMW:[id number] DOB: 02-12-1941 DOA: 11/25/2023 PCP: Lavell Bari LABOR, FNP  Brief History:   83 y.o. female with medical history significant of atrial fibrillation, chronic anemia, coronary artery disease, T2DM, dyslipidemia, GERD, diverticulosis, and hypothyroidism who presented with abdominal pain.     Reported one week of not able to move her bowels, associated with progressive abdominal distention and lower abdominal pain. Very poor appetite with very poor oral intake, mainly drinking water , and having postprandial vomiting.  08/15 follow up with primary care, found to have severe constipation. Bowel obstruction was ruled with abdominal radiographs. Unfortunately her symptoms continue to worsen despite bowel regimen with movantik     Early this am around 4 am, she had worsening abdominal pain and spontaneous vomiting of bile content. Small episode of watery stools. Because of severe of symptoms she called EMS. She was found in distress with heart rate up to 120 bpm, atrial fibrillation, then she was transported to the ED.    She has chronic constipation and at home uses several stool softeners and laxatives.     Assessment/Plan: Obstipation /Adynamic Ileus Continue conservative medical therapy. -appreciate GI -Miralax  17 grams BID  -now having multiple loose BMs -IV fluids -Keep K at 4 and Mg at 2  -Hold on antibiotic therapy for now.  -advance to full liquids   Erosive osteoarthritis of both hands -Patient on hydroxychloroquine  and diclofenac  at home  -holding diclofenac  for now   Coronary artery disease -No chest pain, no acute coronary syndrome.  -Continue metoprolol  and statin   Hypothyroidism Continue with levothyroxine     Paroxysmal atrial fibrillation  -Continue rate control with metoprolol   -Patient has been off anticoagulation, unclear reasons   Depression Continue with as needed alprazolam , and scheduled buspirone      Gastroesophageal reflux Continue pantoprazole     Essential hypertension -holding lisinopril  -restart imdur    Type 2 diabetes mellitus with hyperlipidemia Continue glucose cover and monitoring with insulin  sliding scale -holding semaglutide  - 11/25/2023 hemoglobin A1c 6.8        Family Communication: no  Family at bedside  Consultants:  GI  Code Status:  FULL  DVT Prophylaxis:  [place] Lovenox    Procedures: As Listed in Progress Note Above  Antibiotics: None       Subjective: Patient had 1 bowel movement yesterday and 1 this morning.  She states her abdomen is feeling better.  She denies any nausea, vomiting, chest pain, shortness of breath.  Objective: Vitals:   11/26/23 1224 11/26/23 1938 11/27/23 0512 11/27/23 1249  BP: 135/76 134/79 122/65 128/63  Pulse: 89 87 95 90  Resp:  18 18 17   Temp: (!) 97.4 F (36.3 C) 97.8 F (36.6 C) 98.1 F (36.7 C) 98 F (36.7 C)  TempSrc: Oral Oral Oral Oral  SpO2: 98% 100% 97% 97%  Weight:      Height:       No intake or output data in the 24 hours ending 11/27/23 1741 Weight change:  Exam:  General:  Pt is alert, follows commands appropriately, not in acute distress HEENT: No icterus, No thrush, No neck mass, [place]/AT Cardiovascular: RRR, S1/S2, no rubs, no gallops Respiratory: CTA bilaterally, no wheezing, no crackles, no rhonchi Abdomen: Soft/+BS, non tender, non distended, no guarding Extremities: No edema, No lymphangitis, No petechiae, No rashes, no synovitis   Data Reviewed: I have personally reviewed following labs and imaging studies Basic Metabolic Panel: Recent Labs  Lab  11/25/23 1141 11/26/23 0421 11/27/23 0404  NA 139 137 140  K 4.4 4.0 4.1  CL 102 104 106  CO2 23 25 25   GLUCOSE 143* 147* 121*  BUN 22 17 11   CREATININE 1.07* 0.96 0.86  CALCIUM  9.8 8.7* 8.7*  MG  --   --  1.4*   Liver Function Tests: Recent Labs  Lab 11/25/23 1141 11/26/23 0421  AST 20 17  ALT 18 15  ALKPHOS 154* 111   BILITOT 1.5* 1.1  PROT 7.8 6.1*  ALBUMIN 3.8 2.9*   Recent Labs  Lab 11/25/23 1141  LIPASE 28   No results for input(s): AMMONIA in the last 168 hours. Coagulation Profile: No results for input(s): INR, PROTIME in the last 168 hours. CBC: Recent Labs  Lab 11/25/23 1141 11/26/23 0421 11/27/23 0404  WBC 6.2 6.6 4.7  NEUTROABS 4.9  --   --   HGB 13.3 11.4* 11.0*  HCT 40.4 34.9* 33.6*  MCV 98.8 99.1 98.8  PLT 198 184 184   Cardiac Enzymes: No results for input(s): CKTOTAL, CKMB, CKMBINDEX, TROPONINI in the last 168 hours. BNP: Invalid input(s): POCBNP CBG: Recent Labs  Lab 11/26/23 1602 11/26/23 2145 11/27/23 0724 11/27/23 1109 11/27/23 1627  GLUCAP 112* 115* 107* 136* 116*   HbA1C: Recent Labs    11/25/23 1141  HGBA1C 6.8*   Urine analysis:    Component Value Date/Time   COLORURINE YELLOW 11/25/2023 1526   APPEARANCEUR CLEAR 11/25/2023 1526   APPEARANCEUR Cloudy (A) 01/08/2023 1608   LABSPEC RESULTS UNAVAILABLE DUE TO INTERFERING SUBSTANCE 11/25/2023 1526   PHURINE 5.0 11/25/2023 1526   GLUCOSEU NEGATIVE 11/25/2023 1526   HGBUR NEGATIVE 11/25/2023 1526   BILIRUBINUR NEGATIVE 11/25/2023 1526   BILIRUBINUR Negative 01/08/2023 1608   KETONESUR 20 (A) 11/25/2023 1526   PROTEINUR NEGATIVE 11/25/2023 1526   UROBILINOGEN negative 11/27/2012 1627   UROBILINOGEN 1.0 09/21/2010 1445   NITRITE NEGATIVE 11/25/2023 1526   LEUKOCYTESUR NEGATIVE 11/25/2023 1526   Sepsis Labs: @LABRCNTIP (procalcitonin:4,lacticidven:4) )No results found for this or any previous visit (from the past 240 hours).   Scheduled Meds:  busPIRone   5 mg Oral TID   enoxaparin  (LOVENOX ) injection  40 mg Subcutaneous Q24H   hydroxychloroquine   200 mg Oral Daily   insulin  aspart  0-9 Units Subcutaneous TID WC   levothyroxine   50 mcg Oral Q0600   metoprolol  tartrate  50 mg Oral BID   pantoprazole  (PROTONIX ) IV  40 mg Intravenous Q24H   polyethylene glycol  17 g Oral BID    pravastatin   80 mg Oral QPM   Continuous Infusions:  Procedures/Studies: CT ABDOMEN PELVIS W CONTRAST Result Date: 11/25/2023 CLINICAL DATA:  Bowel obstruction suspected. EXAM: CT ABDOMEN AND PELVIS WITH CONTRAST TECHNIQUE: Multidetector CT imaging of the abdomen and pelvis was performed using the standard protocol following bolus administration of intravenous contrast. RADIATION DOSE REDUCTION: This exam was performed according to the departmental dose-optimization program which includes automated exposure control, adjustment of the mA and/or kV according to patient size and/or use of iterative reconstruction technique. CONTRAST:  OMNIPAQUE  IOHEXOL  300 MG/ML  SOLN COMPARISON:  CT scan abdomen and pelvis from 06/20/2023. FINDINGS: Lower chest: There are patchy atelectatic changes in the visualized lung bases. No overt consolidation. No pleural effusion. The heart is normal in size. No pericardial effusion. Hepatobiliary: The liver is normal in size. Non-cirrhotic configuration. No suspicious mass. No intrahepatic bile duct dilation. There is mild prominence of the extrahepatic bile duct, most likely due to post cholecystectomy  status. Gallbladder is surgically absent. Pancreas: There are 2 adjacent hypoattenuating lesions in the tail with larger lesion measuring up to 7 x 9 mm. These are incompletely characterized on the current exam but appears grossly similar to the prior study. 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 Further evaluation with nonemergent MRI abdomen/MRCP protocol is recommended. The pancreas is otherwise unremarkable. There is mild-to-moderate diffuse atrophy/fatty replacement. No peripancreatic fat stranding. Spleen: Within normal limits. No focal lesion. Adrenals/Urinary Tract: Adrenal glands are unremarkable. No suspicious renal mass. There is a stable sinus cysts in the right kidney upper pole. No nephroureterolithiasis or obstructive uropathy on either side.  Urinary bladder is under distended, precluding optimal assessment. However, no large mass or stones identified. No perivesical fat stranding. Stomach/Bowel: The appendix was not visualized; however there is no acute inflammatory process in the right lower quadrant. The stomach is distended with fluid and air. The duodenal and proximal jejunal bowel loops are nondilated. However, there is disproportionate dilation of multiple small bowel loops measuring in diameter up to 3.6 cm. There is no associated abnormal bowel wall thickening. However, there is mild perivesical fat stranding and prominence of vasa recta. No discrete transition point. Distal multiple small bowel loops are nondilated. There is liquid unformed stool in the colon. There are multiple colonic diverticula mainly in the sigmoid/distal descending colon without diverticulitis. Vascular/Lymphatic: There is trace ascites in the dependent pelvis. No walled-off abscess or loculated collection. No pneumoperitoneum. No abdominal or pelvic lymphadenopathy, by size criteria. No aneurysmal dilation of the major abdominal arteries. There are moderate peripheral atherosclerotic vascular calcifications of the aorta and its major branches. Reproductive: The uterus is surgically absent. No large adnexal mass. Other: There is a tiny fat containing umbilical hernia. The soft tissues and abdominal wall are otherwise unremarkable. Musculoskeletal: No suspicious osseous lesions. There are mild multilevel degenerative changes in the visualized spine. IMPRESSION: 1. There is disproportionate dilation of multiple small bowel loops without discrete transition point. Findings favor enteritis/adynamic ileus. 2. There is liquid unformed stool in the colon, nonspecific but can be seen with diarrhea. 3. No pneumoperitoneum, pneumatosis or walled-off abscess. 4. Multiple other nonacute observations, as described above. Aortic Atherosclerosis (ICD10-I70.0). Electronically Signed    By: Ree Molt M.D.   On: 11/25/2023 13:12   DG Abd 1 View Result Date: 11/22/2023 CLINICAL DATA:  nausea/ constipation EXAM: ABDOMEN - 1 VIEW COMPARISON:  None Available. FINDINGS: Nonobstructive bowel gas pattern. Small volume fecal loading within the right colon. No pneumoperitoneum. No organomegaly or radiopaque calculi. No acute fracture or destructive lesion. Multilevel degenerative disc disease of the spine. IMPRESSION: Nonobstructive bowel gas pattern. Electronically Signed   By: Rogelia Myers M.D.   On: 11/22/2023 11:38    Alm Schneider, DO  [facility]  If 7PM-7AM, please contact night-coverage [email or web address] Password [email or web address] 11/27/2023, 5:41 PM   LOS: 2 days

## 2023-11-27 NOTE — Plan of Care (Signed)
   Problem: Education: Goal: Knowledge of General Education information will improve Description Including pain rating scale, medication(s)/side effects and non-pharmacologic comfort measures Outcome: Progressing   Problem: Health Behavior/Discharge Planning: Goal: Ability to manage health-related needs will improve Outcome: Progressing

## 2023-11-27 NOTE — Progress Notes (Signed)
 Mobility Specialist Progress Note:    11/27/23 1537  Mobility  Activity Ambulated independently  Level of Assistance Independent  Assistive Device None  Distance Ambulated (ft) 300 ft  Range of Motion/Exercises Active;All extremities  Activity Response Tolerated well  Mobility Referral Yes  Mobility visit 1 Mobility  Mobility Specialist Start Time (ACUTE ONLY) 1446  Mobility Specialist Stop Time (ACUTE ONLY) 1510  Mobility Specialist Time Calculation (min) (ACUTE ONLY) 24 min   Pt received ambulating, agreeable to further mobility. Independently able to ambulate with no AD. Tolerated well, c/o stomach pain rated 6/10. Returned to room, left pt sitting EOB. All needs met.   Sherrilee Ditty Mobility Specialist Please contact via Special educational needs teacher or  Rehab office at [phone]

## 2023-11-27 NOTE — Progress Notes (Signed)
 Subjective: States she had about 6 watery stools yesterday, 3 today but no real volume of stool. Having some lower abdominal pain. Belly feels full. She has been up walking this morning, drinking some prune juice to try and help. Endorses nausea, belching. No passage of flatus.   Objective: Vital signs in last 24 hours: Temp:  [97.4 F (36.3 C)-98.1 F (36.7 C)] 98.1 F (36.7 C) (08/20 0512) Pulse Rate:  [87-95] 95 (08/20 0512) Resp:  [18] 18 (08/20 0512) BP: (122-135)/(65-79) 122/65 (08/20 0512) SpO2:  [97 %-100 %] 97 % (08/20 0512)   General:   Alert and oriented, pleasant Head:  Normocephalic and atraumatic. Eyes:  No icterus, sclera clear. Conjuctiva pink.  Mouth:  Without lesions, mucosa pink and moist.  Heart:  S1, S2 present, no murmurs noted.  Lungs: Clear to auscultation bilaterally, without wheezing, rales, or rhonchi.  Abdomen:  Bowel sounds present, soft, non-tender, non-distended. No HSM or hernias noted. No rebound or guarding. No masses appreciated  Neurologic:  Alert and  oriented x4;  grossly normal neurologically. Skin:  Warm and dry, intact without significant lesions.  Psych:  Alert and cooperative. Normal mood and affect.  Intake/Output from previous day: 08/19 0701 - 08/20 0700 In: 840 [P.O.:840] Out: -  Intake/Output this shift: No intake/output data recorded.  Lab Results: Recent Labs    11/25/23 1141 11/26/23 0421 11/27/23 0404  WBC 6.2 6.6 4.7  HGB 13.3 11.4* 11.0*  HCT 40.4 34.9* 33.6*  PLT 198 184 184   BMET Recent Labs    11/25/23 1141 11/26/23 0421 11/27/23 0404  NA 139 137 140  K 4.4 4.0 4.1  CL 102 104 106  CO2 23 25 25   GLUCOSE 143* 147* 121*  BUN 22 17 11   CREATININE 1.07* 0.96 0.86  CALCIUM  9.8 8.7* 8.7*   LFT Recent Labs    11/25/23 1141 11/26/23 0421  PROT 7.8 6.1*  ALBUMIN 3.8 2.9*  AST 20 17  ALT 18 15  ALKPHOS 154* 111  BILITOT 1.5* 1.1  BILIDIR  --  0.3*  IBILI  --  0.8    Studies/Results: CT ABDOMEN  PELVIS W CONTRAST Result Date: 11/25/2023 CLINICAL DATA:  Bowel obstruction suspected. EXAM: CT ABDOMEN AND PELVIS WITH CONTRAST TECHNIQUE: Multidetector CT imaging of the abdomen and pelvis was performed using the standard protocol following bolus administration of intravenous contrast. RADIATION DOSE REDUCTION: This exam was performed according to the departmental dose-optimization program which includes automated exposure control, adjustment of the mA and/or kV according to patient size and/or use of iterative reconstruction technique. CONTRAST:  OMNIPAQUE  IOHEXOL  300 MG/ML  SOLN COMPARISON:  CT scan abdomen and pelvis from 06/20/2023. FINDINGS: Lower chest: There are patchy atelectatic changes in the visualized lung bases. No overt consolidation. No pleural effusion. The heart is normal in size. No pericardial effusion. Hepatobiliary: The liver is normal in size. Non-cirrhotic configuration. No suspicious mass. No intrahepatic bile duct dilation. There is mild prominence of the extrahepatic bile duct, most likely due to post cholecystectomy status. Gallbladder is surgically absent. Pancreas: There are 2 adjacent hypoattenuating lesions in the tail with larger lesion measuring up to 7 x 9 mm. These are incompletely characterized on the current exam but appears grossly similar to the prior study. 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 Further evaluation with nonemergent MRI abdomen/MRCP protocol is recommended. The pancreas is otherwise unremarkable. There is mild-to-moderate diffuse atrophy/fatty replacement. No peripancreatic fat stranding. Spleen: Within normal limits. No focal lesion. Adrenals/Urinary  Tract: Adrenal glands are unremarkable. No suspicious renal mass. There is a stable sinus cysts in the right kidney upper pole. No nephroureterolithiasis or obstructive uropathy on either side. Urinary bladder is under distended, precluding optimal assessment. However, no large mass or  stones identified. No perivesical fat stranding. Stomach/Bowel: The appendix was not visualized; however there is no acute inflammatory process in the right lower quadrant. The stomach is distended with fluid and air. The duodenal and proximal jejunal bowel loops are nondilated. However, there is disproportionate dilation of multiple small bowel loops measuring in diameter up to 3.6 cm. There is no associated abnormal bowel wall thickening. However, there is mild perivesical fat stranding and prominence of vasa recta. No discrete transition point. Distal multiple small bowel loops are nondilated. There is liquid unformed stool in the colon. There are multiple colonic diverticula mainly in the sigmoid/distal descending colon without diverticulitis. Vascular/Lymphatic: There is trace ascites in the dependent pelvis. No walled-off abscess or loculated collection. No pneumoperitoneum. No abdominal or pelvic lymphadenopathy, by size criteria. No aneurysmal dilation of the major abdominal arteries. There are moderate peripheral atherosclerotic vascular calcifications of the aorta and its major branches. Reproductive: The uterus is surgically absent. No large adnexal mass. Other: There is a tiny fat containing umbilical hernia. The soft tissues and abdominal wall are otherwise unremarkable. Musculoskeletal: No suspicious osseous lesions. There are mild multilevel degenerative changes in the visualized spine. IMPRESSION: 1. There is disproportionate dilation of multiple small bowel loops without discrete transition point. Findings favor enteritis/adynamic ileus. 2. There is liquid unformed stool in the colon, nonspecific but can be seen with diarrhea. 3. No pneumoperitoneum, pneumatosis or walled-off abscess. 4. Multiple other nonacute observations, as described above. Aortic Atherosclerosis (ICD10-I70.0). Electronically Signed   By: Ree Molt M.D.   On: 11/25/2023 13:12    Assessment: Rhonda Garcia is an 83  year old female with a history of afib, anemia, CAD, DM, dyslipidemia, hypothyroidism, chronic constipation, adenomatous colon polyps, GERD, presenting to the ED today with abdominal pain, vomiting, constipation. Admitted with constipation/obstipation and CT showing enteritis vs adynamic ileus, liquid unformed stool in colon. GI consulted for further evaluation  Constipation/obstipation: -CT findings of likely adynamic ileus.  This may be related to daily Rybelsus , also takes tramadol  but sparingly -TSH WNL -s/p 6 doses of MiraLAX  monday night in efforts to get her bowels moving.  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 Aqib Lough, MSN, APRN, AGNP-C Adult-Gerontology Nurse Practitioner [facility] at [facility]

## 2023-11-28 DIAGNOSIS — I480 Paroxysmal atrial fibrillation: Secondary | ICD-10-CM | POA: Diagnosis not present

## 2023-11-28 DIAGNOSIS — K560 Paralytic ileus: Secondary | ICD-10-CM | POA: Diagnosis not present

## 2023-11-28 DIAGNOSIS — E785 Hyperlipidemia, unspecified: Secondary | ICD-10-CM | POA: Diagnosis not present

## 2023-11-28 DIAGNOSIS — K8689 Other specified diseases of pancreas: Secondary | ICD-10-CM

## 2023-11-28 DIAGNOSIS — E1169 Type 2 diabetes mellitus with other specified complication: Secondary | ICD-10-CM | POA: Diagnosis not present

## 2023-11-28 LAB — CBC
HCT: 34.6 % — ABNORMAL LOW (ref 36.0–46.0)
Hemoglobin: 11.1 g/dL — ABNORMAL LOW (ref 12.0–15.0)
MCH: 31.8 pg (ref 26.0–34.0)
MCHC: 32.1 g/dL (ref 30.0–36.0)
MCV: 99.1 fL (ref 80.0–100.0)
Platelets: 184 K/uL (ref 150–400)
RBC: 3.49 MIL/uL — ABNORMAL LOW (ref 3.87–5.11)
RDW: 14.6 % (ref 11.5–15.5)
WBC: 6.5 K/uL (ref 4.0–10.5)
nRBC: 0 % (ref 0.0–0.2)

## 2023-11-28 LAB — BASIC METABOLIC PANEL WITH GFR
Anion gap: 10 (ref 5–15)
BUN: 9 mg/dL (ref 8–23)
CO2: 26 mmol/L (ref 22–32)
Calcium: 9.4 mg/dL (ref 8.9–10.3)
Chloride: 105 mmol/L (ref 98–111)
Creatinine, Ser: 0.99 mg/dL (ref 0.44–1.00)
GFR, Estimated: 57 mL/min — ABNORMAL LOW (ref >60)
Glucose, Bld: 134 mg/dL — ABNORMAL HIGH (ref 70–99)
Potassium: 4.3 mmol/L (ref 3.5–5.1)
Sodium: 141 mmol/L (ref 135–145)

## 2023-11-28 LAB — GLUCOSE, CAPILLARY
Glucose-Capillary: 111 mg/dL — ABNORMAL HIGH (ref 70–99)
Glucose-Capillary: 120 mg/dL — ABNORMAL HIGH (ref 70–99)
Glucose-Capillary: 121 mg/dL — ABNORMAL HIGH (ref 70–99)
Glucose-Capillary: 105 mg/dL — ABNORMAL HIGH (ref 70–99)

## 2023-11-28 MED ORDER — LINACLOTIDE 145 MCG PO CAPS
145.0000 ug | ORAL_CAPSULE | Freq: Every day | ORAL | Status: DC
  Administered 2023-11-29: 145 ug via ORAL
  Filled 2023-11-28: qty 1

## 2023-11-28 NOTE — Progress Notes (Signed)
 Mobility Specialist Progress Note:    11/28/23 0959  Mobility  Activity Ambulated independently  Level of Assistance Independent  Assistive Device None  Distance Ambulated (ft) 400 ft  Range of Motion/Exercises Active;All extremities  Activity Response Tolerated well  Mobility Referral Yes  Mobility visit 1 Mobility  Mobility Specialist Start Time (ACUTE ONLY) 0944  Mobility Specialist Stop Time (ACUTE ONLY) 0959  Mobility Specialist Time Calculation (min) (ACUTE ONLY) 15 min   Pt received in bed, agreeable to mobility. Independently able to stand and ambulate with no AD. Tolerated well, asx throughout. Returned to room, all needs met.   Sherrilee Ditty Mobility Specialist Please contact via Special educational needs teacher or  Rehab office at [phone]

## 2023-11-28 NOTE — Plan of Care (Signed)
   Problem: Education: Goal: Knowledge of General Education information will improve Description: Including pain rating scale, medication(s)/side effects and non-pharmacologic comfort measures Outcome: Progressing   Problem: Clinical Measurements: Goal: Ability to maintain clinical measurements within normal limits will improve Outcome: Progressing Goal: Diagnostic test results will improve Outcome: Progressing

## 2023-11-28 NOTE — Progress Notes (Signed)
 Gastroenterology Progress Note   Referring Provider: No ref. provider found Primary Care Physician:  Lavell Bari LABOR, FNP Primary Gastroenterologist:  Ozell Hollingshead, MD Patient ID: Rhonda Garcia Phlegm; [id number]; 01-Nov-1940   Subjective:    Overall feeling better. Several watery small stools yesterday. Two stools today with little more substance. Tolerating advanced diet (fulls). Mild nausea but much better. Abdominal pain improving but remains sore all over, more focal llq. Notes she typically takes castor oil/prune juice daily at home. Has not taking Linzess  290 regularly. Used to be on 72mcg daily and did well until earlier this year. Declined 145mcg stating it would cost more than for her. PCP started in July but she has significant diarrhea and could not tolerate daily so taking sporadically.   Objective:   Vital signs in last 24 hours: Temp:  [97.8 F (36.6 C)-98 F (36.7 C)] 98 F (36.7 C) (08/21 1358) Pulse Rate:  [86-87] 86 (08/21 1358) Resp:  [18] 18 (08/21 0509) BP: (105-119)/(54-69) 108/69 (08/21 1358) SpO2:  [94 %-100 %] 100 % (08/21 1358)   General:   Alert,  Well-developed, well-nourished, pleasant and cooperative in NAD Head:  Normocephalic and atraumatic. Eyes:  Sclera clear, no icterus.   Abdomen:  Soft, nondistended. Mild diffuse tenderness. Slightly tympanic bowel sounds. No guarding or rebound. Extremities:  Without clubbing, deformity or edema. Neurologic:  Alert and  oriented x4;  grossly normal neurologically. Skin:  Intact without significant lesions or rashes. Psych:  Alert and cooperative. Normal mood and affect.  Intake/Output from previous day: No intake/output data recorded. Intake/Output this shift: No intake/output data recorded.  Lab Results: CBC Recent Labs    11/26/23 0421 11/27/23 0404 11/28/23 0414  WBC 6.6 4.7 6.5  HGB 11.4* 11.0* 11.1*  HCT 34.9* 33.6* 34.6*  MCV 99.1 98.8 99.1  PLT 184 184 184   BMET Recent  Labs    11/26/23 0421 11/27/23 0404 11/28/23 0414  NA 137 140 141  K 4.0 4.1 4.3  CL 104 106 105  CO2 25 25 26   GLUCOSE 147* 121* 134*  BUN 17 11 9   CREATININE 0.96 0.86 0.99  CALCIUM  8.7* 8.7* 9.4   LFTs Recent Labs    11/26/23 0421  BILITOT 1.1  BILIDIR 0.3*  IBILI 0.8  ALKPHOS 111  AST 17  ALT 15  PROT 6.1*  ALBUMIN 2.9*   No results for input(s): LIPASE in the last 72 hours. PT/INR No results for input(s): LABPROT, INR in the last 72 hours.       Imaging Studies: CT ABDOMEN PELVIS W CONTRAST Result Date: 11/25/2023 CLINICAL DATA:  Bowel obstruction suspected. EXAM: CT ABDOMEN AND PELVIS WITH CONTRAST TECHNIQUE: Multidetector CT imaging of the abdomen and pelvis was performed using the standard protocol following bolus administration of intravenous contrast. RADIATION DOSE REDUCTION: This exam was performed according to the departmental dose-optimization program which includes automated exposure control, adjustment of the mA and/or kV according to patient size and/or use of iterative reconstruction technique. CONTRAST:  OMNIPAQUE  IOHEXOL  300 MG/ML  SOLN COMPARISON:  CT scan abdomen and pelvis from 06/20/2023. FINDINGS: Lower chest: There are patchy atelectatic changes in the visualized lung bases. No overt consolidation. No pleural effusion. The heart is normal in size. No pericardial effusion. Hepatobiliary: The liver is normal in size. Non-cirrhotic configuration. No suspicious mass. No intrahepatic bile duct dilation. There is mild prominence of the extrahepatic bile duct, most likely due to post cholecystectomy status. Gallbladder is surgically absent. Pancreas: There  are 2 adjacent hypoattenuating lesions in the tail with larger lesion measuring up to 7 x 9 mm. These are incompletely characterized on the current exam but appears grossly similar to the prior study. 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 Further evaluation with nonemergent MRI  abdomen/MRCP protocol is recommended. The pancreas is otherwise unremarkable. There is mild-to-moderate diffuse atrophy/fatty replacement. No peripancreatic fat stranding. Spleen: Within normal limits. No focal lesion. Adrenals/Urinary Tract: Adrenal glands are unremarkable. No suspicious renal mass. There is a stable sinus cysts in the right kidney upper pole. No nephroureterolithiasis or obstructive uropathy on either side. Urinary bladder is under distended, precluding optimal assessment. However, no large mass or stones identified. No perivesical fat stranding. Stomach/Bowel: The appendix was not visualized; however there is no acute inflammatory process in the right lower quadrant. The stomach is distended with fluid and air. The duodenal and proximal jejunal bowel loops are nondilated. However, there is disproportionate dilation of multiple small bowel loops measuring in diameter up to 3.6 cm. There is no associated abnormal bowel wall thickening. However, there is mild perivesical fat stranding and prominence of vasa recta. No discrete transition point. Distal multiple small bowel loops are nondilated. There is liquid unformed stool in the colon. There are multiple colonic diverticula mainly in the sigmoid/distal descending colon without diverticulitis. Vascular/Lymphatic: There is trace ascites in the dependent pelvis. No walled-off abscess or loculated collection. No pneumoperitoneum. No abdominal or pelvic lymphadenopathy, by size criteria. No aneurysmal dilation of the major abdominal arteries. There are moderate peripheral atherosclerotic vascular calcifications of the aorta and its major branches. Reproductive: The uterus is surgically absent. No large adnexal mass. Other: There is a tiny fat containing umbilical hernia. The soft tissues and abdominal wall are otherwise unremarkable. Musculoskeletal: No suspicious osseous lesions. There are mild multilevel degenerative changes in the visualized spine.  IMPRESSION: 1. There is disproportionate dilation of multiple small bowel loops without discrete transition point. Findings favor enteritis/adynamic ileus. 2. There is liquid unformed stool in the colon, nonspecific but can be seen with diarrhea. 3. No pneumoperitoneum, pneumatosis or walled-off abscess. 4. Multiple other nonacute observations, as described above. Aortic Atherosclerosis (ICD10-I70.0). Electronically Signed   By: Ree Molt M.D.   On: 11/25/2023 13:12   DG Abd 1 View Result Date: 11/22/2023 CLINICAL DATA:  nausea/ constipation EXAM: ABDOMEN - 1 VIEW COMPARISON:  None Available. FINDINGS: Nonobstructive bowel gas pattern. Small volume fecal loading within the right colon. No pneumoperitoneum. No organomegaly or radiopaque calculi. No acute fracture or destructive lesion. Multilevel degenerative disc disease of the spine. IMPRESSION: Nonobstructive bowel gas pattern. Electronically Signed   By: Rogelia Myers M.D.   On: 11/22/2023 11:38  [2 weeks]  Assessment:   Aleigha F. Burandt is an 83 year old female with a history of afib, anemia, CAD, DM, dyslipidemia, hypothyroidism, chronic constipation, adenomatous colon polyps, GERD, presenting to the ED today with abdominal pain, vomiting, constipation. Admitted with constipation/obstipation and CT showing enteritis vs adynamic ileus, liquid unformed stool in colon. GI consulted for further evaluation   Constipation/obstipation: -CT findings of likely adynamic ileus.  This may be related to daily Rybelsus , also takes tramadol  but sparingly -TSH WNL -s/p 6 doses of MiraLAX  monday night in efforts to get her bowels moving. Continues miralax  BID -having liquid stools yesterday, two stools today so far, little more consistency. Overall some improvement in symptoms -nausea has lessened -continues with diffuse abdominal tenderness, mostly LLQ but overall improved -patient reports that at home she takes  castor oil and/or warm prune juice  daily. She does not feel like miralax  works well for her as outpatient. She admits that she did not take Linzess  290mcg daily (started by PCP in July) because she wouldn't be able to leave her house. Takes sporadically, never consistently to get past the wash out period. May benefit from reducing dose. Earlier this year I discussed increasing from 72mcg (which used to work well for her) to but she declined stating it would cost more. In 10/2023 her PCP wrote for the , patient states she cannot tolerate daily.   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 Resume Linzess , try 145mcg daily, begin in the morning. If this does not control symptoms, we can consider motegrity.  Close interval outpatient follow up for bowel regimen adjustments and to  arrange outpatient MRI for pancreatic lesions.   LOS: 3 days   Sonny RAMAN. Ezzard RIGGERS [facility] [phone] 8/21/20252:05 PM

## 2023-11-28 NOTE — Progress Notes (Signed)
 PROGRESS NOTE  Rhonda Garcia FMW:[id number] DOB: 03/28/1941 DOA: 11/25/2023 PCP: Lavell Bari LABOR, FNP  Brief History:   83 y.o. female with medical history significant of atrial fibrillation, chronic anemia, coronary artery disease, T2DM, dyslipidemia, GERD, diverticulosis, and hypothyroidism who presented with abdominal pain.     Reported one week of not able to move her bowels, associated with progressive abdominal distention and lower abdominal pain. Very poor appetite with very poor oral intake, mainly drinking water , and having postprandial vomiting.  08/15 follow up with primary care, found to have severe constipation. Bowel obstruction was ruled with abdominal radiographs. Unfortunately her symptoms continue to worsen despite bowel regimen with movantik     Early this am around 4 am, she had worsening abdominal pain and spontaneous vomiting of bile content. Small episode of watery stools. Because of severe of symptoms she called EMS. She was found in distress with heart rate up to 120 bpm, atrial fibrillation, then she was transported to the ED.    She has chronic constipation and at home uses several stool softeners and laxatives.     Assessment/Plan: Obstipation /Adynamic Ileus Continue conservative medical therapy. -11/25/23 CT AP--disproportionate dilation of multiple small bowel loopswithout discrete transition point.  -appreciate GI -Miralax  17 grams BID  -continues to have multiple loose BMs -IV fluids -Keep K at 4 and Mg at 2  -Hold on antibiotic therapy for now.  -advance to soft diet   Erosive osteoarthritis of both hands -Patient on hydroxychloroquine  and diclofenac  at home  -holding diclofenac  for now   Coronary artery disease -No chest pain, no acute coronary syndrome.  -Continue metoprolol  and statin   Hypothyroidism Continue with levothyroxine     Paroxysmal atrial fibrillation  -Continue rate control with metoprolol   -Patient has been off  anticoagulation, unclear reasons   Depression Continue with as needed alprazolam , and scheduled buspirone     Gastroesophageal reflux Continue pantoprazole     Essential hypertension -holding lisinopril  -restart imdur    Type 2 diabetes mellitus with hyperlipidemia Continue glucose cover and monitoring with insulin  sliding scale -holding semaglutide  - 11/25/2023 hemoglobin A1c 6.8               Family Communication: no  Family at bedside   Consultants:  GI   Code Status:  FULL   DVT Prophylaxis:  [place] Lovenox      Procedures: As Listed in Progress Note Above   Antibiotics: None         Subjective: Patient states that she is tolerating her full liquids.  She has some abdominal discomfort but overall has improved.  She had at least 3 bowel movements in last 24 hours.  There is no vomiting.  She denies any chest pain or shortness of breath.  Objective: Vitals:   11/27/23 1249 11/27/23 2038 11/28/23 0509 11/28/23 1358  BP: 128/63 119/68 (!) 105/54 108/69  Pulse: 90 87 86 86  Resp: 17 18 18    Temp: 98 F (36.7 C) 98 F (36.7 C) 97.8 F (36.6 C) 98 F (36.7 C)  TempSrc: Oral Oral  Oral  SpO2: 97% 94% 97% 100%  Weight:      Height:       No intake or output data in the 24 hours ending 11/28/23 1639 Weight change:  Exam:  General:  Pt is alert, follows commands appropriately, not in acute distress HEENT: No icterus, No thrush, No neck mass, Elliott/AT Cardiovascular: RRR, S1/S2, no rubs, no gallops Respiratory: CTA  bilaterally, no wheezing, no crackles, no rhonchi Abdomen: Soft/+BS, mild lower abdominal tender, non distended, no guarding Extremities: No edema, No lymphangitis, No petechiae, No rashes, no synovitis   Data Reviewed: I have personally reviewed following labs and imaging studies Basic Metabolic Panel: Recent Labs  Lab 11/25/23 1141 11/26/23 0421 11/27/23 0404 11/28/23 0414  NA 139 137 140 141  K 4.4 4.0 4.1 4.3  CL 102 104 106 105  CO2  23 25 25 26   GLUCOSE 143* 147* 121* 134*  BUN 22 17 11 9   CREATININE 1.07* 0.96 0.86 0.99  CALCIUM  9.8 8.7* 8.7* 9.4  MG  --   --  1.4*  --    Liver Function Tests: Recent Labs  Lab 11/25/23 1141 11/26/23 0421  AST 20 17  ALT 18 15  ALKPHOS 154* 111  BILITOT 1.5* 1.1  PROT 7.8 6.1*  ALBUMIN 3.8 2.9*   Recent Labs  Lab 11/25/23 1141  LIPASE 28   No results for input(s): AMMONIA in the last 168 hours. Coagulation Profile: No results for input(s): INR, PROTIME in the last 168 hours. CBC: Recent Labs  Lab 11/25/23 1141 11/26/23 0421 11/27/23 0404 11/28/23 0414  WBC 6.2 6.6 4.7 6.5  NEUTROABS 4.9  --   --   --   HGB 13.3 11.4* 11.0* 11.1*  HCT 40.4 34.9* 33.6* 34.6*  MCV 98.8 99.1 98.8 99.1  PLT 198 184 184 184   Cardiac Enzymes: No results for input(s): CKTOTAL, CKMB, CKMBINDEX, TROPONINI in the last 168 hours. BNP: Invalid input(s): POCBNP CBG: Recent Labs  Lab 11/27/23 1627 11/27/23 2132 11/28/23 0734 11/28/23 1117 11/28/23 1609  GLUCAP 116* 96 111* 120* 121*   HbA1C: No results for input(s): HGBA1C in the last 72 hours. Urine analysis:    Component Value Date/Time   COLORURINE YELLOW 11/25/2023 1526   APPEARANCEUR CLEAR 11/25/2023 1526   APPEARANCEUR Cloudy (A) 01/08/2023 1608   LABSPEC RESULTS UNAVAILABLE DUE TO INTERFERING SUBSTANCE 11/25/2023 1526   PHURINE 5.0 11/25/2023 1526   GLUCOSEU NEGATIVE 11/25/2023 1526   HGBUR NEGATIVE 11/25/2023 1526   BILIRUBINUR NEGATIVE 11/25/2023 1526   BILIRUBINUR Negative 01/08/2023 1608   KETONESUR 20 (A) 11/25/2023 1526   PROTEINUR NEGATIVE 11/25/2023 1526   UROBILINOGEN negative 11/27/2012 1627   UROBILINOGEN 1.0 09/21/2010 1445   NITRITE NEGATIVE 11/25/2023 1526   LEUKOCYTESUR NEGATIVE 11/25/2023 1526   Sepsis Labs: @LABRCNTIP (procalcitonin:4,lacticidven:4) )No results found for this or any previous visit (from the past 240 hours).   Scheduled Meds:  busPIRone   5 mg Oral TID    enoxaparin  (LOVENOX ) injection  40 mg Subcutaneous Q24H   hydroxychloroquine   200 mg Oral Daily   insulin  aspart  0-9 Units Subcutaneous TID WC   isosorbide  mononitrate  30 mg Oral Daily   levothyroxine   50 mcg Oral Q0600   metoprolol  tartrate  50 mg Oral BID   pantoprazole  (PROTONIX ) IV  40 mg Intravenous Q24H   polyethylene glycol  17 g Oral BID   pravastatin   80 mg Oral QPM   Continuous Infusions:  Procedures/Studies: CT ABDOMEN PELVIS W CONTRAST Result Date: 11/25/2023 CLINICAL DATA:  Bowel obstruction suspected. EXAM: CT ABDOMEN AND PELVIS WITH CONTRAST TECHNIQUE: Multidetector CT imaging of the abdomen and pelvis was performed using the standard protocol following bolus administration of intravenous contrast. RADIATION DOSE REDUCTION: This exam was performed according to the departmental dose-optimization program which includes automated exposure control, adjustment of the mA and/or kV according to patient size and/or use of iterative reconstruction technique.  CONTRAST:  OMNIPAQUE  IOHEXOL  300 MG/ML  SOLN COMPARISON:  CT scan abdomen and pelvis from 06/20/2023. FINDINGS: Lower chest: There are patchy atelectatic changes in the visualized lung bases. No overt consolidation. No pleural effusion. The heart is normal in size. No pericardial effusion. Hepatobiliary: The liver is normal in size. Non-cirrhotic configuration. No suspicious mass. No intrahepatic bile duct dilation. There is mild prominence of the extrahepatic bile duct, most likely due to post cholecystectomy status. Gallbladder is surgically absent. Pancreas: There are 2 adjacent hypoattenuating lesions in the tail with larger lesion measuring up to 7 x 9 mm. These are incompletely characterized on the current exam but appears grossly similar to the prior study. 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 Further evaluation with nonemergent MRI abdomen/MRCP protocol is recommended. The pancreas is otherwise  unremarkable. There is mild-to-moderate diffuse atrophy/fatty replacement. No peripancreatic fat stranding. Spleen: Within normal limits. No focal lesion. Adrenals/Urinary Tract: Adrenal glands are unremarkable. No suspicious renal mass. There is a stable sinus cysts in the right kidney upper pole. No nephroureterolithiasis or obstructive uropathy on either side. Urinary bladder is under distended, precluding optimal assessment. However, no large mass or stones identified. No perivesical fat stranding. Stomach/Bowel: The appendix was not visualized; however there is no acute inflammatory process in the right lower quadrant. The stomach is distended with fluid and air. The duodenal and proximal jejunal bowel loops are nondilated. However, there is disproportionate dilation of multiple small bowel loops measuring in diameter up to 3.6 cm. There is no associated abnormal bowel wall thickening. However, there is mild perivesical fat stranding and prominence of vasa recta. No discrete transition point. Distal multiple small bowel loops are nondilated. There is liquid unformed stool in the colon. There are multiple colonic diverticula mainly in the sigmoid/distal descending colon without diverticulitis. Vascular/Lymphatic: There is trace ascites in the dependent pelvis. No walled-off abscess or loculated collection. No pneumoperitoneum. No abdominal or pelvic lymphadenopathy, by size criteria. No aneurysmal dilation of the major abdominal arteries. There are moderate peripheral atherosclerotic vascular calcifications of the aorta and its major branches. Reproductive: The uterus is surgically absent. No large adnexal mass. Other: There is a tiny fat containing umbilical hernia. The soft tissues and abdominal wall are otherwise unremarkable. Musculoskeletal: No suspicious osseous lesions. There are mild multilevel degenerative changes in the visualized spine. IMPRESSION: 1. There is disproportionate dilation of multiple  small bowel loops without discrete transition point. Findings favor enteritis/adynamic ileus. 2. There is liquid unformed stool in the colon, nonspecific but can be seen with diarrhea. 3. No pneumoperitoneum, pneumatosis or walled-off abscess. 4. Multiple other nonacute observations, as described above. Aortic Atherosclerosis (ICD10-I70.0). Electronically Signed   By: Ree Molt M.D.   On: 11/25/2023 13:12   DG Abd 1 View Result Date: 11/22/2023 CLINICAL DATA:  nausea/ constipation EXAM: ABDOMEN - 1 VIEW COMPARISON:  None Available. FINDINGS: Nonobstructive bowel gas pattern. Small volume fecal loading within the right colon. No pneumoperitoneum. No organomegaly or radiopaque calculi. No acute fracture or destructive lesion. Multilevel degenerative disc disease of the spine. IMPRESSION: Nonobstructive bowel gas pattern. Electronically Signed   By: Rogelia Myers M.D.   On: 11/22/2023 11:38    Alm Schneider, DO  [facility]  If 7PM-7AM, please contact night-coverage [email or web address] Password [facility] 11/28/2023, 4:39 PM   LOS: 3 days

## 2023-11-29 ENCOUNTER — Telehealth: Payer: Self-pay | Admitting: Gastroenterology

## 2023-11-29 ENCOUNTER — Inpatient Hospital Stay (HOSPITAL_COMMUNITY)

## 2023-11-29 DIAGNOSIS — K560 Paralytic ileus: Secondary | ICD-10-CM | POA: Diagnosis not present

## 2023-11-29 DIAGNOSIS — I480 Paroxysmal atrial fibrillation: Secondary | ICD-10-CM | POA: Diagnosis not present

## 2023-11-29 DIAGNOSIS — I10 Essential (primary) hypertension: Secondary | ICD-10-CM | POA: Diagnosis not present

## 2023-11-29 LAB — CBC
HCT: 32 % — ABNORMAL LOW (ref 36.0–46.0)
Hemoglobin: 10.3 g/dL — ABNORMAL LOW (ref 12.0–15.0)
MCH: 31.8 pg (ref 26.0–34.0)
MCHC: 32.2 g/dL (ref 30.0–36.0)
MCV: 98.8 fL (ref 80.0–100.0)
Platelets: 154 K/uL (ref 150–400)
RBC: 3.24 MIL/uL — ABNORMAL LOW (ref 3.87–5.11)
RDW: 14.6 % (ref 11.5–15.5)
WBC: 4.8 K/uL (ref 4.0–10.5)
nRBC: 0 % (ref 0.0–0.2)

## 2023-11-29 LAB — BASIC METABOLIC PANEL WITH GFR
Anion gap: 10 (ref 5–15)
BUN: 8 mg/dL (ref 8–23)
CO2: 26 mmol/L (ref 22–32)
Calcium: 9.3 mg/dL (ref 8.9–10.3)
Chloride: 103 mmol/L (ref 98–111)
Creatinine, Ser: 1.05 mg/dL — ABNORMAL HIGH (ref 0.44–1.00)
GFR, Estimated: 53 mL/min — ABNORMAL LOW (ref >60)
Glucose, Bld: 133 mg/dL — ABNORMAL HIGH (ref 70–99)
Potassium: 4.2 mmol/L (ref 3.5–5.1)
Sodium: 139 mmol/L (ref 135–145)

## 2023-11-29 LAB — GLUCOSE, CAPILLARY: Glucose-Capillary: 139 mg/dL — ABNORMAL HIGH (ref 70–99)

## 2023-11-29 LAB — MAGNESIUM: Magnesium: 1.4 mg/dL — ABNORMAL LOW (ref 1.7–2.4)

## 2023-11-29 MED ORDER — LINACLOTIDE 145 MCG PO CAPS: 145.0000 ug | ORAL_CAPSULE | Freq: Every day | ORAL | 1 refills | Status: DC

## 2023-11-29 NOTE — Telephone Encounter (Signed)
 Please make patient a hospital follow up in 3-4 weeks with Sonny or Dr. Shaaron.

## 2023-11-29 NOTE — Care Management Important Message (Signed)
 Important Message  Patient Details  Name: Rhonda Garcia MRN: [id number] Date of Birth: 08-24-1940   Important Message Given:  Yes - Medicare IM     Elizibeth Breau L Dam Ashraf 11/29/2023, 11:56 AM

## 2023-11-29 NOTE — Discharge Summary (Addendum)
 Physician Discharge Summary   Patient: Rhonda Garcia MRN: [id number] DOB: 1940/12/20  Admit date:     11/25/2023  Discharge date: 11/29/23  Discharge Physician: Alm Nefertiti Mohamad   PCP: Lavell Bari LABOR, FNP   Recommendations at discharge:   Please follow up with primary care provider within 1-2 weeks  Please repeat BMP and CBC in one week     Hospital Course:  83 y.o. female with medical history significant of atrial fibrillation, chronic anemia, coronary artery disease, T2DM, dyslipidemia, GERD, diverticulosis, and hypothyroidism who presented with abdominal pain.     Reported one week of not able to move her bowels, associated with progressive abdominal distention and lower abdominal pain. Very poor appetite with very poor oral intake, mainly drinking water , and having postprandial vomiting.  08/15 follow up with primary care, found to have severe constipation. Bowel obstruction was ruled with abdominal radiographs. Unfortunately her symptoms continue to worsen despite bowel regimen with movantik     Early this am around 4 am, she had worsening abdominal pain and spontaneous vomiting of bile content. Small episode of watery stools. Because of severe of symptoms she called EMS. She was found in distress with heart rate up to 120 bpm, atrial fibrillation, then she was transported to the ED.    She has chronic constipation and at home uses several stool softeners and laxatives.    Assessment and Plan: Obstipation /Adynamic Ileus Continue conservative medical therapy. -11/25/23 CT AP--disproportionate dilation of multiple small bowel loopswithout discrete transition point.  -appreciate GI -Miralax  17 grams BID and Linzess  145 mcg -continues to have multiple loose BMs -IV fluids -Keep K at 4 and Mg at 2  -Hold on antibiotic therapy for now.  -advance to soft diet which pt tolerated and she continued to have BMs - 8/22 abd xray--nonobstructive; no ileus   Erosive osteoarthritis of both  hands -Patient on hydroxychloroquine  and diclofenac  at home  -holding diclofenac  for now   Coronary artery disease -No chest pain, no acute coronary syndrome.  -Continue metoprolol  and statin   Hypothyroidism Continue with levothyroxine     Paroxysmal atrial fibrillation  -Continue rate control with metoprolol   -Patient has been off anticoagulation, unclear reasons -discussed with patient--unsure why AC stopped--she states she will address with her PCP after dc   Depression Continue with as needed alprazolam , and scheduled buspirone     Gastroesophageal reflux Continue pantoprazole     Essential hypertension -holding lisinopril  -restart imdur    Type 2 diabetes mellitus with hyperlipidemia Continue glucose cover and monitoring with insulin  sliding scale -holding semaglutide  - 11/25/2023 hemoglobin A1c 6.8         Consultants: GI Procedures performed: none  Disposition: Home Diet recommendation:  Cardiac diet DISCHARGE MEDICATION: Allergies as of 11/29/2023       Reactions   Fentanyl  Anaphylaxis   Promethazine  Hcl Anaphylaxis   Farxiga  [dapagliflozin ] Other (See Comments)   Recurrent yeast infections   Jardiance  [empagliflozin ] Other (See Comments)   Vaginal burning/raw   Metformin  And Related Other (See Comments)   Leg pain        Medication List     STOP taking these medications    lisinopril  10 MG tablet Commonly known as: ZESTRIL    naloxegol  oxalate 12.5 MG Tabs tablet Commonly known as: MOVANTIK    ondansetron  4 MG tablet Commonly known as: ZOFRAN        TAKE these medications    ALPRAZolam  0.5 MG tablet Commonly known as: XANAX  Take 1 tablet (0.5 mg total) by mouth daily as  needed. What changed: reasons to take this   castor oil liquid Take 15 mLs by mouth daily.   COLON CLEANSE PO Take 1 Scoop by mouth daily.   diclofenac  75 MG EC tablet Commonly known as: VOLTAREN  Take 1 tablet (75 mg total) by mouth 2 (two) times daily.    hydroxychloroquine  200 MG tablet Commonly known as: PLAQUENIL  TAKE ONE (1) TABLET BY MOUTH EVERY DAY   isosorbide  mononitrate 60 MG 24 hr tablet Commonly known as: IMDUR  Take 60 mg by mouth daily.   lactulose  10 GM/15ML solution Commonly known as: CHRONULAC  Take 15 mLs (10 g total) by mouth daily as needed for mild constipation or moderate constipation.   levothyroxine  50 MCG tablet Commonly known as: SYNTHROID  TAKE ONE (1) TABLET EACH DAY   linaclotide  145 MCG Caps capsule Commonly known as: LINZESS  Take 1 capsule (145 mcg total) by mouth daily before breakfast. Start taking on: November 30, 2023 What changed:  medication strength how much to take   meclizine  12.5 MG tablet Commonly known as: ANTIVERT  Take 1 tablet (12.5 mg total) by mouth 3 (three) times daily as needed for dizziness (Vertigo).   metoprolol  tartrate 50 MG tablet Commonly known as: LOPRESSOR  Take 1 tablet (50 mg total) by mouth 2 (two) times daily.   nitroGLYCERIN  0.4 MG SL tablet Commonly known as: NITROSTAT  Place 1 tablet (0.4 mg total) under the tongue every 5 (five) minutes as needed for chest pain.   pantoprazole  40 MG tablet Commonly known as: PROTONIX  Take 1 tablet (40 mg total) by mouth 2 (two) times daily.   polyethylene glycol 17 g packet Commonly known as: MIRALAX  / GLYCOLAX  Take 17 g by mouth daily.   pravastatin  80 MG tablet Commonly known as: PRAVACHOL  Take 1 tablet (80 mg total) by mouth every evening. What changed: when to take this   Rybelsus  7 MG Tabs Generic drug: Semaglutide  Take 1 tablet (7 mg total) by mouth daily.        Discharge Exam: Filed Weights   11/25/23 1123 11/25/23 1518  Weight: 72.1 kg 70.2 kg   HEENT:  [place]/AT, No thrush, no icterus CV:  IRRR, no rub, no S3, no S4 Lung:  CTA, no wheeze, no rhonchi Abd:  soft/+BS, NT Ext:  No edema, no lymphangitis, no synovitis, no rash   Condition at discharge: stable  The results of significant diagnostics from  this hospitalization (including imaging, microbiology, ancillary and laboratory) are listed below for reference.   Imaging Studies: DG Abd 1 View Result Date: 11/29/2023 CLINICAL DATA:  Ileus EXAM: ABDOMEN - 1 VIEW COMPARISON:  CT 11/25/2023 FINDINGS: Artifact overlies the upper abdomen. Bowel gas pattern is normal without evidence of ileus or obstruction. No sign of free air. No significant soft tissue calcifications or bone findings. IMPRESSION: Normal bowel gas pattern. No evidence of ileus or obstruction. Electronically Signed   By: Oneil Officer M.D.   On: 11/29/2023 10:21   CT ABDOMEN PELVIS W CONTRAST Result Date: 11/25/2023 CLINICAL DATA:  Bowel obstruction suspected. EXAM: CT ABDOMEN AND PELVIS WITH CONTRAST TECHNIQUE: Multidetector CT imaging of the abdomen and pelvis was performed using the standard protocol following bolus administration of intravenous contrast. RADIATION DOSE REDUCTION: This exam was performed according to the departmental dose-optimization program which includes automated exposure control, adjustment of the mA and/or kV according to patient size and/or use of iterative reconstruction technique. CONTRAST:  OMNIPAQUE  IOHEXOL  300 MG/ML  SOLN COMPARISON:  CT scan abdomen and pelvis from 06/20/2023. FINDINGS: Lower  chest: There are patchy atelectatic changes in the visualized lung bases. No overt consolidation. No pleural effusion. The heart is normal in size. No pericardial effusion. Hepatobiliary: The liver is normal in size. Non-cirrhotic configuration. No suspicious mass. No intrahepatic bile duct dilation. There is mild prominence of the extrahepatic bile duct, most likely due to post cholecystectomy status. Gallbladder is surgically absent. Pancreas: There are 2 adjacent hypoattenuating lesions in the tail with larger lesion measuring up to 7 x 9 mm. These are incompletely characterized on the current exam but appears grossly similar to the prior study. 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 Further evaluation with nonemergent MRI abdomen/MRCP protocol is recommended. The pancreas is otherwise unremarkable. There is mild-to-moderate diffuse atrophy/fatty replacement. No peripancreatic fat stranding. Spleen: Within normal limits. No focal lesion. Adrenals/Urinary Tract: Adrenal glands are unremarkable. No suspicious renal mass. There is a stable sinus cysts in the right kidney upper pole. No nephroureterolithiasis or obstructive uropathy on either side. Urinary bladder is under distended, precluding optimal assessment. However, no large mass or stones identified. No perivesical fat stranding. Stomach/Bowel: The appendix was not visualized; however there is no acute inflammatory process in the right lower quadrant. The stomach is distended with fluid and air. The duodenal and proximal jejunal bowel loops are nondilated. However, there is disproportionate dilation of multiple small bowel loops measuring in diameter up to 3.6 cm. There is no associated abnormal bowel wall thickening. However, there is mild perivesical fat stranding and prominence of vasa recta. No discrete transition point. Distal multiple small bowel loops are nondilated. There is liquid unformed stool in the colon. There are multiple colonic diverticula mainly in the sigmoid/distal descending colon without diverticulitis. Vascular/Lymphatic: There is trace ascites in the dependent pelvis. No walled-off abscess or loculated collection. No pneumoperitoneum. No abdominal or pelvic lymphadenopathy, by size criteria. No aneurysmal dilation of the major abdominal arteries. There are moderate peripheral atherosclerotic vascular calcifications of the aorta and its major branches. Reproductive: The uterus is surgically absent. No large adnexal mass. Other: There is a tiny fat containing umbilical hernia. The soft tissues and abdominal wall are otherwise unremarkable. Musculoskeletal: No suspicious osseous  lesions. There are mild multilevel degenerative changes in the visualized spine. IMPRESSION: 1. There is disproportionate dilation of multiple small bowel loops without discrete transition point. Findings favor enteritis/adynamic ileus. 2. There is liquid unformed stool in the colon, nonspecific but can be seen with diarrhea. 3. No pneumoperitoneum, pneumatosis or walled-off abscess. 4. Multiple other nonacute observations, as described above. Aortic Atherosclerosis (ICD10-I70.0). Electronically Signed   By: Ree Molt M.D.   On: 11/25/2023 13:12   DG Abd 1 View Result Date: 11/22/2023 CLINICAL DATA:  nausea/ constipation EXAM: ABDOMEN - 1 VIEW COMPARISON:  None Available. FINDINGS: Nonobstructive bowel gas pattern. Small volume fecal loading within the right colon. No pneumoperitoneum. No organomegaly or radiopaque calculi. No acute fracture or destructive lesion. Multilevel degenerative disc disease of the spine. IMPRESSION: Nonobstructive bowel gas pattern. Electronically Signed   By: Rogelia Myers M.D.   On: 11/22/2023 11:38    Microbiology: Results for orders placed or performed in visit on 01/08/23  Microscopic Examination     Status: Abnormal   Collection Time: 01/08/23  4:08 PM   Urine  Result Value Ref Range Status   WBC, UA 0-5 0 - 5 /hpf Final   RBC, Urine 0-2 0 - 2 /hpf Final   Epithelial Cells (non renal) 0-10 0 - 10 /hpf Final   Renal  Epithel, UA None seen None seen /hpf Final   Bacteria, UA Few (A) None seen/Few Final   Yeast, UA None seen None seen Final  Urine Culture     Status: None   Collection Time: 01/08/23  4:11 PM   Specimen: Urine   UR  Result Value Ref Range Status   Urine Culture, Routine Final report  Final   Organism ID, Bacteria Comment  Final    Comment: Mixed urogenital flora 10,000-25,000 colony forming units per mL    ORGANISM ID, BACTERIA Not applicable  Final    Labs: CBC: Recent Labs  Lab 11/25/23 1141 11/26/23 0421 11/27/23 0404  11/28/23 0414 11/29/23 0410  WBC 6.2 6.6 4.7 6.5 4.8  NEUTROABS 4.9  --   --   --   --   HGB 13.3 11.4* 11.0* 11.1* 10.3*  HCT 40.4 34.9* 33.6* 34.6* 32.0*  MCV 98.8 99.1 98.8 99.1 98.8  PLT 198 184 184 184 154   Basic Metabolic Panel: Recent Labs  Lab 11/25/23 1141 11/26/23 0421 11/27/23 0404 11/28/23 0414 11/29/23 0410  NA 139 137 140 141 139  K 4.4 4.0 4.1 4.3 4.2  CL 102 104 106 105 103  CO2 23 25 25 26 26   GLUCOSE 143* 147* 121* 134* 133*  BUN 22 17 11 9 8   CREATININE 1.07* 0.96 0.86 0.99 1.05*  CALCIUM  9.8 8.7* 8.7* 9.4 9.3  MG  --   --  1.4*  --  1.4*   Liver Function Tests: Recent Labs  Lab 11/25/23 1141 11/26/23 0421  AST 20 17  ALT 18 15  ALKPHOS 154* 111  BILITOT 1.5* 1.1  PROT 7.8 6.1*  ALBUMIN 3.8 2.9*   CBG: Recent Labs  Lab 11/28/23 0734 11/28/23 1117 11/28/23 1609 11/28/23 2155 11/29/23 0737  GLUCAP 111* 120* 121* 105* 139*    Discharge time spent: greater than 30 minutes.  Signed: Alm Schneider, MD [facility] 11/29/2023

## 2023-11-29 NOTE — Progress Notes (Signed)
 Gastroenterology Progress Note   Referring Provider: No ref. provider found Primary Care Physician:  Lavell Bari LABOR, FNP Primary Gastroenterologist:  Lamar HERO.Rourk, MD  Patient ID: Rhonda Garcia; [id number]; 1941/03/21    Subjective   Feeling well this morning.  Tolerating diet.  Had 3 small bowel movements with gas this morning.  Denies any abdominal pain, nausea, vomiting.  When discussing her plan, she expresses that she would like to continue Linzess  290 mcg and take this consistently rather than changing her dose to 145 mcg.  Objective   Vital signs in last 24 hours Temp:  [97.6 F (36.4 C)-98.1 F (36.7 C)] 98.1 F (36.7 C) (08/22 0419) Pulse Rate:  [82-86] 82 (08/22 0419) Resp:  [20] 20 (08/21 2153) BP: (108-133)/(69-78) 133/69 (08/22 0419) SpO2:  [93 %-100 %] 97 % (08/22 0419) Last BM Date : 11/28/23  Physical Exam General:   Alert and oriented, pleasant Head:  Normocephalic and atraumatic. Eyes:  No icterus, sclera clear. Conjuctiva pink.  Mouth:  Without lesions, mucosa pink and moist.  Neck:  Supple, without thyromegaly or masses.  Abdomen:  Bowel sounds present, soft, non-tender, non-distended. No HSM or hernias noted. No rebound or guarding. No masses appreciated  Neurologic:  Alert and  oriented x4;  grossly normal neurologically. Skin:  Warm and dry, intact without significant lesions.  Psych:  Alert and cooperative. Normal mood and affect.  Intake/Output from previous day: 08/21 0701 - 08/22 0700 In: 720 [P.O.:720] Out: -  Intake/Output this shift: Total I/O In: 120 [P.O.:120] Out: -   Lab Results  Recent Labs    11/27/23 0404 11/28/23 0414 11/29/23 0410  WBC 4.7 6.5 4.8  HGB 11.0* 11.1* 10.3*  HCT 33.6* 34.6* 32.0*  PLT 184 184 154   BMET Recent Labs    11/27/23 0404 11/28/23 0414 11/29/23 0410  NA 140 141 139  K 4.1 4.3 4.2  CL 106 105 103  CO2 25 26 26   GLUCOSE 121* 134* 133*  BUN 11 9 8   CREATININE 0.86 0.99 1.05*   CALCIUM  8.7* 9.4 9.3   LFT No results for input(s): PROT, ALBUMIN, AST, ALT, ALKPHOS, BILITOT, BILIDIR, IBILI in the last 72 hours. PT/INR No results for input(s): LABPROT, INR in the last 72 hours. Hepatitis Panel No results for input(s): HEPBSAG, HCVAB, HEPAIGM, HEPBIGM in the last 72 hours.   Studies/Results DG Abd 1 View Result Date: 11/29/2023 CLINICAL DATA:  Ileus EXAM: ABDOMEN - 1 VIEW COMPARISON:  CT 11/25/2023 FINDINGS: Artifact overlies the upper abdomen. Bowel gas pattern is normal without evidence of ileus or obstruction. No sign of free air. No significant soft tissue calcifications or bone findings. IMPRESSION: Normal bowel gas pattern. No evidence of ileus or obstruction. Electronically Signed   By: Oneil Officer M.D.   On: 11/29/2023 10:21   CT ABDOMEN PELVIS W CONTRAST Result Date: 11/25/2023 CLINICAL DATA:  Bowel obstruction suspected. EXAM: CT ABDOMEN AND PELVIS WITH CONTRAST TECHNIQUE: Multidetector CT imaging of the abdomen and pelvis was performed using the standard protocol following bolus administration of intravenous contrast. RADIATION DOSE REDUCTION: This exam was performed according to the departmental dose-optimization program which includes automated exposure control, adjustment of the mA and/or kV according to patient size and/or use of iterative reconstruction technique. CONTRAST:  OMNIPAQUE  IOHEXOL  300 MG/ML  SOLN COMPARISON:  CT scan abdomen and pelvis from 06/20/2023. FINDINGS: Lower chest: There are patchy atelectatic changes in the visualized lung bases. No overt consolidation. No pleural effusion. The heart  is normal in size. No pericardial effusion. Hepatobiliary: The liver is normal in size. Non-cirrhotic configuration. No suspicious mass. No intrahepatic bile duct dilation. There is mild prominence of the extrahepatic bile duct, most likely due to post cholecystectomy status. Gallbladder is surgically absent. Pancreas: There  are 2 adjacent hypoattenuating lesions in the tail with larger lesion measuring up to 7 x 9 mm. These are incompletely characterized on the current exam but appears grossly similar to the prior study. 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 Further evaluation with nonemergent MRI abdomen/MRCP protocol is recommended. The pancreas is otherwise unremarkable. There is mild-to-moderate diffuse atrophy/fatty replacement. No peripancreatic fat stranding. Spleen: Within normal limits. No focal lesion. Adrenals/Urinary Tract: Adrenal glands are unremarkable. No suspicious renal mass. There is a stable sinus cysts in the right kidney upper pole. No nephroureterolithiasis or obstructive uropathy on either side. Urinary bladder is under distended, precluding optimal assessment. However, no large mass or stones identified. No perivesical fat stranding. Stomach/Bowel: The appendix was not visualized; however there is no acute inflammatory process in the right lower quadrant. The stomach is distended with fluid and air. The duodenal and proximal jejunal bowel loops are nondilated. However, there is disproportionate dilation of multiple small bowel loops measuring in diameter up to 3.6 cm. There is no associated abnormal bowel wall thickening. However, there is mild perivesical fat stranding and prominence of vasa recta. No discrete transition point. Distal multiple small bowel loops are nondilated. There is liquid unformed stool in the colon. There are multiple colonic diverticula mainly in the sigmoid/distal descending colon without diverticulitis. Vascular/Lymphatic: There is trace ascites in the dependent pelvis. No walled-off abscess or loculated collection. No pneumoperitoneum. No abdominal or pelvic lymphadenopathy, by size criteria. No aneurysmal dilation of the major abdominal arteries. There are moderate peripheral atherosclerotic vascular calcifications of the aorta and its major branches.  Reproductive: The uterus is surgically absent. No large adnexal mass. Other: There is a tiny fat containing umbilical hernia. The soft tissues and abdominal wall are otherwise unremarkable. Musculoskeletal: No suspicious osseous lesions. There are mild multilevel degenerative changes in the visualized spine. IMPRESSION: 1. There is disproportionate dilation of multiple small bowel loops without discrete transition point. Findings favor enteritis/adynamic ileus. 2. There is liquid unformed stool in the colon, nonspecific but can be seen with diarrhea. 3. No pneumoperitoneum, pneumatosis or walled-off abscess. 4. Multiple other nonacute observations, as described above. Aortic Atherosclerosis (ICD10-I70.0). Electronically Signed   By: Ree Molt M.D.   On: 11/25/2023 13:12   DG Abd 1 View Result Date: 11/22/2023 CLINICAL DATA:  nausea/ constipation EXAM: ABDOMEN - 1 VIEW COMPARISON:  None Available. FINDINGS: Nonobstructive bowel gas pattern. Small volume fecal loading within the right colon. No pneumoperitoneum. No organomegaly or radiopaque calculi. No acute fracture or destructive lesion. Multilevel degenerative disc disease of the spine. IMPRESSION: Nonobstructive bowel gas pattern. Electronically Signed   By: Rogelia Myers M.D.   On: 11/22/2023 11:38    Assessment  83 y.o. female with a history of A-fib, anemia, diabetes, CAD, dyslipidemia, hypothyroidism, chronic constipation, adenomatous colon polyps, and GERD presenting to the ED with complaints of abdominal pain, vomiting, and constipation without a bowel movement for at least a week.  CT showed enteritis versus adynamic ileus with liquid unformed stool in the colon.  GI consulted for further evaluation.  Constipation/Obstipation: - CT this admission with findings likely of adynamic ileus possibly related to opioid use and Rybelsus . - Thyroid  function within normal limits - Initially started  on MiraLAX  twice daily, started having bowel  movements yesterday and has continued with 3 this morning - Initially presented with some nausea as well as diffuse abdominal pain, abdominal pain resolved today and not nauseous and tolerating diet. - Continues to report that at times at home she will use castor oil mixed with orange juice or drink warm prune juice and does not feel as though MiraLAX  works very well for her, had not been taking her Linzess  290 mcg consistently as she likely has never gotten past the washout period.  She has motivated to get past this.  Now instead of decreasing her dose to 145 she would like to stay at 290 mcg daily for now.  I discussed with her that if she could take this every day and only take MiraLAX  when she is leaving the house then that would be a better plan to ensure she has regular bowel function.  She is agreeable to this.  Elevated alk phos: - Elevated at 131 on 7/11 and peaked at 154 on 8/18.  Normalized back to 111 on 8/19. - Finding could be nonspecific in the setting of acute illness, if further elevation in the future may need to consider further workup.  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 Encouraged mobility Can consider motegrity in the future if no improvement with Lizness.  Recheck CBC/Bmp in 1 week with PCP. Outpatient MRI for pancreatic lesion Follow up in 3-4 weeks.     LOS: 4 days    11/29/2023, 11:18 AM   Charmaine Melia, MSN, FNP-BC, AGACNP-BC [facility]

## 2023-12-02 ENCOUNTER — Telehealth: Payer: Self-pay | Admitting: *Deleted

## 2023-12-02 NOTE — Transitions of Care (Post Inpatient/ED Visit) (Signed)
 12/02/2023  Name: Rhonda Garcia MRN: [id number] DOB: Aug 07, 1940  Today's TOC FU Call Status: Today's TOC FU Call Status:: Successful TOC FU Call Completed TOC FU Call Complete Date: 12/02/23 Patient's Name and Date of Birth confirmed.  Transition Care Management Follow-up Telephone Call Date of Discharge: 11/29/23 Discharge [facility] Type of Discharge: Inpatient Admission Primary Inpatient Discharge Diagnosis:: Small bowel obstruction How have you been since you were released from the hospital?: Better (appetite is better, has occasional nausea, having daily BM's but mostly liquid/ diarrhea, takes lactulose  or unable to have BM, pt is independent, drives) Any questions or concerns?: No  Items Reviewed: Did you receive and understand the discharge instructions provided?: Yes Medications obtained,verified, and reconciled?: Yes (Medications Reviewed) Any new allergies since your discharge?: No Dietary orders reviewed?: Yes Type of Diet Ordered:: carbohydrate modified, heart healthy Do you have support at home?: Yes People in Home [RPT]: alone Name of Support/Comfort Primary Source: pt states she has help if needed, does not provide name  Medications Reviewed Today: Medications Reviewed Today     Reviewed by Aura Mliss LABOR, RN (Registered Nurse) on 12/02/23 at 1125  Med List Status: <None>   Medication Order Taking? Sig Documenting Provider Last Dose Status Informant  ALPRAZolam  (XANAX ) 0.5 MG tablet [id number] Yes Take 1 tablet (0.5 mg total) by mouth daily as needed. Lavell Bari LABOR, FNP  Active Self, Pharmacy Records  castor oil liquid [id number] Yes Take 15 mLs by mouth daily. [provider]  Active Self, Pharmacy Records  diclofenac  (VOLTAREN ) 75 MG EC tablet [id number] Yes Take 1 tablet (75 mg total) by mouth 2 (two) times daily. Lavell Bari LABOR, FNP  Active Self, Pharmacy Records  hydroxychloroquine  (PLAQUENIL ) 200 MG tablet [id number] Yes TAKE  ONE (1) TABLET BY MOUTH EVERY DAY Rice, Lonni ORN, MD  Active Self, Pharmacy Records  isosorbide  mononitrate (IMDUR ) 60 MG 24 hr tablet [id number] Yes Take 60 mg by mouth daily. [provider]  Active Self, Pharmacy Records           Med Note DRENA, CHUCK KANDICE Kitchens Nov 25, 2023  2:55 PM)    lactulose  (CHRONULAC ) 10 GM/15ML solution [id number] Yes Take 15 mLs (10 g total) by mouth daily as needed for mild constipation or moderate constipation. Lavell Bari LABOR, FNP  Active Self, Pharmacy Records  levothyroxine  (SYNTHROID ) 50 MCG tablet [id number] Yes TAKE ONE (1) TABLET EACH DAY Hawks, Honalo A, FNP  Active Self, Pharmacy Records  linaclotide  (LINZESS ) 145 MCG CAPS capsule [id number] Yes Take 1 capsule (145 mcg total) by mouth daily before breakfast. Tat, Alm, MD  Active   meclizine  (ANTIVERT ) 12.5 MG tablet [id number] Yes Take 1 tablet (12.5 mg total) by mouth 3 (three) times daily as needed for dizziness (Vertigo). Dettinger, Fonda LABOR, MD  Active Self, Pharmacy Records  metoprolol  tartrate (LOPRESSOR ) 50 MG tablet [id number] Yes Take 1 tablet (50 mg total) by mouth 2 (two) times daily. Lavell Bari LABOR, FNP  Active Self, Pharmacy Records  Misc Natural Products (COLON CLEANSE PO) [id number] Yes Take 1 Scoop by mouth daily. [provider]  Active Self, Pharmacy Records  nitroGLYCERIN  (NITROSTAT ) 0.4 MG SL tablet [id number] Yes Place 1 tablet (0.4 mg total) under the tongue every 5 (five) minutes as needed for chest pain. Lavell Bari LABOR, FNP  Active Self, Pharmacy Records  pantoprazole  (PROTONIX ) 40 MG tablet [id number] Yes Take 1 tablet (40 mg total) by mouth 2 (two) times daily. Hawks, [place] A,  FNP  Active Self, Pharmacy Records  polyethylene glycol (MIRALAX  / GLYCOLAX ) 17 g packet [id number]  Take 17 g by mouth daily.  Patient not taking: Reported on 12/02/2023   [provider]  Active Self, Pharmacy Records  pravastatin  (PRAVACHOL ) 80 MG tablet [id number] Yes Take 1  tablet (80 mg total) by mouth every evening. Lavell Bari LABOR, FNP  Active Self, Pharmacy Records  Semaglutide  (RYBELSUS ) 7 MG TABS [id number] Yes Take 1 tablet (7 mg total) by mouth daily. Lavell Bari LABOR, FNP  Active Self, Pharmacy Records            Home Care and Equipment/Supplies: Were Home Health Services Ordered?: No Any new equipment or medical supplies ordered?: No  Functional Questionnaire: Do you need assistance with bathing/showering or dressing?: No Do you need assistance with meal preparation?: No Do you need assistance with eating?: No Do you have difficulty maintaining continence: No Do you need assistance with getting out of bed/getting out of a chair/moving?: No Do you have difficulty managing or taking your medications?: No  Follow up appointments reviewed: PCP Follow-up appointment confirmed?: Yes Date of PCP follow-up appointment?: 12/06/23 Follow-up Provider: Bari Lavell FNP Specialist [facility] Follow-up appointment confirmed?: No Reason Specialist Follow-Up Not Confirmed: Patient has Specialist Provider Number and will Call for Appointment Do you need transportation to your follow-up appointment?: No Do you understand care options if your condition(s) worsen?: Yes-patient verbalized understanding  SDOH Interventions Today    Flowsheet Row Most Recent Value  SDOH Interventions   Food Insecurity Interventions Intervention Not Indicated  Housing Interventions Intervention Not Indicated  Transportation Interventions Intervention Not Indicated  Utilities Interventions Intervention Not Indicated    Goals Addressed             This Visit's Progress    VBCI Transitions of Care (TOC) Care Plan       Problems:  Recent Hospitalization for treatment of Small Bowel Obstruction No Specialist appointment pt states she is able to call and schedule specialist appointment Pt reports she is out shopping at [facility] but is able to talk, pt reports she feels much  better, appetite is getting better, has nausea on occasion, has bowel movements daily and has to take lactulose  daily or cannot have a bowel movement, reports this is chronic has been going on a long time, pt states bowel movements are not formed, states similar to diarrhea Pt was not able to have colonoscopy due to  I wasn't cleaned out good enough  plans to follow up on when she will have another colonoscopy  Goal:  Over the next 30 days, the patient will not experience hospital readmission  Interventions:   Evaluation of current treatment plan related to SBO, self-management and patient's adherence to plan as established by provider. Discussed plans with patient for ongoing care management follow up and provided patient with direct contact information for care management team Evaluation of current treatment plan related to SBO and patient's adherence to plan as established by provider Advised patient to report any symptoms of SBO to provider immediately Reviewed medications with patient and discussed importance of taking as prescribed Discussed plans with patient for ongoing care management follow up and provided patient with direct contact information for care management team Screening for signs and symptoms of depression related to chronic disease state  Assessed social determinant of health barriers Reviewed signs/ symptoms of SBO, reportable signs /symptoms Routed today's note to primary care provider  Patient Self Care Activities:  Attend all scheduled  provider appointments Attend church or other social activities Call pharmacy for medication refills 3-7 days in advance of running out of medications Call provider office for new concerns or questions  Notify RN Care Manager of TOC call rescheduling needs Participate in Transition of Care Program/Attend TOC scheduled calls Perform all self care activities independently  Perform IADL's (shopping, preparing meals, housekeeping,  managing finances) independently Take medications as prescribed   If you have abdominal pain, change in bowel movements, appetite or overall not feeling well, please report to your provider immediately, seek emergency assistance if needed  Plan:  Telephone follow up appointment with care management team member scheduled for:  12/10/23 @ 1030 am The patient has been provided with contact information for the care management team and has been advised to call with any health related questions or concerns.         Mliss Creed [facility], BSN RN Care Manager/ Transition of Care [place]/ [facility] [phone]

## 2023-12-06 ENCOUNTER — Encounter: Payer: Self-pay | Admitting: Family

## 2023-12-06 ENCOUNTER — Ambulatory Visit (INDEPENDENT_AMBULATORY_CARE_PROVIDER_SITE_OTHER): Admitting: Family

## 2023-12-06 VITALS — BP 118/78 | HR 79 | Temp 97.8°F | Ht 62.0 in | Wt 156.2 lb

## 2023-12-06 DIAGNOSIS — E1169 Type 2 diabetes mellitus with other specified complication: Secondary | ICD-10-CM | POA: Diagnosis not present

## 2023-12-06 DIAGNOSIS — K5900 Constipation, unspecified: Secondary | ICD-10-CM

## 2023-12-06 DIAGNOSIS — K56609 Unspecified intestinal obstruction, unspecified as to partial versus complete obstruction: Secondary | ICD-10-CM

## 2023-12-06 DIAGNOSIS — E785 Hyperlipidemia, unspecified: Secondary | ICD-10-CM

## 2023-12-06 DIAGNOSIS — I480 Paroxysmal atrial fibrillation: Secondary | ICD-10-CM | POA: Diagnosis not present

## 2023-12-06 MED ORDER — APIXABAN 2.5 MG PO TABS: 2.5000 mg | ORAL_TABLET | Freq: Two times a day (BID) | ORAL | 2 refills | Status: DC

## 2023-12-06 NOTE — Patient Instructions (Signed)

## 2023-12-06 NOTE — Progress Notes (Signed)
 Subjective:    Patient ID: Rhonda Garcia, female    DOB: June 13, 1940, 83 y.o.   MRN: [id number]  Chief Complaint  Patient presents with   Medical Management of Chronic Issues   Today's visit was for Transitional Care Management.  The patient was discharged from [facility] on 11/29/23 with a primary diagnosis of SBO.   Contact with the patient and/or caregiver, by a clinical staff member, was made on 12/02/23 and was documented as a telephone encounter within the EMR.  Through chart review and discussion with the patient I have determined that management of their condition is of moderate complexity.   She reports she had a BM yesterday and doing better now. Has a follow up with GI on 12/31/23. Denies constant abdominal pain.    Constipation This is a chronic problem. The current episode started more than 1 year ago. The problem has been resolved since onset. Her stool frequency is 2 to 3 times per week. She has tried laxatives (lactulose  adn linzess ) for the symptoms. The treatment provided mild relief.  Diabetes She presents for her follow-up diabetic visit. She has type 2 diabetes mellitus. Associated symptoms include foot paresthesias. Pertinent negatives for diabetes include no blurred vision. Symptoms are stable. Risk factors for coronary artery disease include dyslipidemia, diabetes mellitus, hypertension, sedentary lifestyle and post-menopausal. She is following a generally healthy diet. Her overall blood glucose range is 180-200 mg/dl.      Review of Systems  Eyes:  Negative for blurred vision.  Gastrointestinal:  Positive for constipation.  All other systems reviewed and are negative.   Social History   Socioeconomic History   Marital status: Married    Spouse name: Not on file   Number of children: 4   Years of education: 6   Highest education level: 6th grade  Occupational History   Occupation: Retired    Comment: Engineer, site  Tobacco Use   Smoking  status: Never   Smokeless tobacco: Never  Vaping Use   Vaping status: Never Used  Substance and Sexual Activity   Alcohol use: Not Currently    Alcohol/week: 1.0 standard drink of alcohol    Types: 1 Glasses of wine per week   Drug use: No   Sexual activity: Not Currently    Birth control/protection: Post-menopausal  Other Topics Concern   Not on file  Social History Narrative    Has 4 adult children. She lives home with one of her sons who recently had a stroke and needs 24 hour care. She is the primary caregiver and is now legally separated from her husband.    Social Drivers of Corporate investment banker Strain: Low Risk  (10/15/2023)   Overall Financial Resource Strain (CARDIA)    Difficulty of Paying Living Expenses: Not hard at all  Food Insecurity: No Food Insecurity (12/02/2023)   Hunger Vital Sign    Worried About Running Out of Food in the Last Year: Never true    Ran Out of Food in the Last Year: Never true  Transportation Needs: No Transportation Needs (12/02/2023)   PRAPARE - Administrator, Civil Service (Medical): No    Lack of Transportation (Non-Medical): No  Physical Activity: Insufficiently Active (10/15/2023)   Exercise Vital Sign    Days of Exercise per Week: 4 days    Minutes of Exercise per Session: 10 min  Stress: No Stress Concern Present (10/15/2023)   [name] of Occupational Health - Occupational Stress Questionnaire  Feeling of Stress: Not at all  Social Connections: Unknown (11/25/2023)   Social Connection and Isolation Panel    Frequency of Communication with Friends and Family: More than three times a week    Frequency of Social Gatherings with Friends and Family: Three times a week    Attends Religious Services: 1 to 4 times per year    Active Member of Clubs or Organizations: No    Attends Banker Meetings: 1 to 4 times per year    Marital Status: Patient declined  Recent Concern: Social Connections - Moderately  Isolated (10/15/2023)   Social Connection and Isolation Panel    Frequency of Communication with Friends and Family: More than three times a week    Frequency of Social Gatherings with Friends and Family: More than three times a week    Attends Religious Services: More than 4 times per year    Active Member of [employer] or Organizations: No    Attends Banker Meetings: Never    Marital Status: Widowed   Family History  Problem Relation Age of Onset   Heart attack Mother    Hypertension Mother    Stroke Mother        Deceased, age 29   Diabetes Sister    Arthritis Sister    Diabetes Sister    Cancer Sister        ovarian   Stroke Sister    Alzheimer's disease Sister    Diabetes Brother        also has a blood disorder    Clotting disorder Brother    Kidney disease Brother    Heart disease Brother    Melanoma Brother        deceased   Diabetes Son    Hypertension Son    Coronary artery disease Son 55       CABG   Stroke Son    Hypertension Son    Coronary artery disease Son 63       CABG   Colon cancer Neg Hx    Breast cancer Neg Hx         Objective:   Physical Exam Vitals reviewed.  Constitutional:      General: She is not in acute distress.    Appearance: She is well-developed.  HENT:     Head: Normocephalic and atraumatic.     Right Ear: Tympanic membrane normal.     Left Ear: Tympanic membrane normal.  Eyes:     Pupils: Pupils are equal, round, and reactive to light.  Neck:     Thyroid : No thyromegaly.  Cardiovascular:     Rate and Rhythm: Normal rate and regular rhythm.     Heart sounds: Normal heart sounds. No murmur heard. Pulmonary:     Effort: Pulmonary effort is normal. No respiratory distress.     Breath sounds: Normal breath sounds. No wheezing.  Abdominal:     General: Bowel sounds are normal. There is no distension.     Palpations: Abdomen is soft.     Tenderness: There is no abdominal tenderness.  Musculoskeletal:         General: No tenderness. Normal range of motion.     Cervical back: Normal range of motion and neck supple.  Skin:    General: Skin is warm and dry.  Neurological:     Mental Status: She is alert and oriented to person, place, and time.     Cranial Nerves: No cranial nerve deficit.  Deep Tendon Reflexes: Reflexes are normal and symmetric.  Psychiatric:        Behavior: Behavior normal.        Thought Content: Thought content normal.        Judgment: Judgment normal.       BP 118/78   Pulse 79   Temp 97.8 F (36.6 C) (Temporal)   Ht 5' 2 (1.575 m)   Wt 156 lb 3.2 oz (70.9 kg)   SpO2 100%   BMI 28.57 kg/m      Assessment & Plan:  Rhonda Garcia comes in today with chief complaint of Medical Management of Chronic Issues   Diagnosis and orders addressed:  1. Type 2 diabetes mellitus with hyperlipidemia (HCC) (Primary) - CMP14+EGFR - CBC with Differential/Platelet  2. SBO (small bowel obstruction) (HCC) - CMP14+EGFR - CBC with Differential/Platelet  3. Constipation, unspecified constipation type - CMP14+EGFR - CBC with Differential/Platelet  4. Paroxysmal atrial fibrillation (HCC) - apixaban  (ELIQUIS ) 2.5 MG TABS tablet; Take 1 tablet (2.5 mg total) by mouth 2 (two) times daily.  Dispense: 180 tablet; Refill: 2 - CMP14+EGFR - CBC with Differential/Platelet   Labs pending Hospital notes reviewed Start Eliquis  2.5 mg BID, unsure why she stopped this in the past.  Continue current medications  Keep follow up with specialists  Health Maintenance reviewed Diet and exercise encouraged Keep chronic follow up    Bari Learn, FNP

## 2023-12-07 LAB — CBC WITH DIFFERENTIAL/PLATELET
Basophils Absolute: 0.1 x10E3/uL (ref 0.0–0.2)
Basos: 1 %
EOS (ABSOLUTE): 0.1 x10E3/uL (ref 0.0–0.4)
Eos: 2 %
Hematocrit: 35.8 % (ref 34.0–46.6)
Hemoglobin: 11.5 g/dL (ref 11.1–15.9)
Immature Grans (Abs): 0 x10E3/uL (ref 0.0–0.1)
Immature Granulocytes: 0 %
Lymphocytes Absolute: 1.5 x10E3/uL (ref 0.7–3.1)
Lymphs: 24 %
MCH: 32.7 pg (ref 26.6–33.0)
MCHC: 32.1 g/dL (ref 31.5–35.7)
MCV: 102 fL — ABNORMAL HIGH (ref 79–97)
Monocytes Absolute: 0.6 x10E3/uL (ref 0.1–0.9)
Monocytes: 10 %
Neutrophils Absolute: 3.7 x10E3/uL (ref 1.4–7.0)
Neutrophils: 62 %
Platelets: 126 x10E3/uL — ABNORMAL LOW (ref 150–450)
RBC: 3.52 x10E6/uL — ABNORMAL LOW (ref 3.77–5.28)
RDW: 14.4 % (ref 11.7–15.4)
WBC: 6 x10E3/uL (ref 3.4–10.8)

## 2023-12-07 LAB — CMP14+EGFR
ALT: 14 IU/L (ref 0–32)
AST: 17 IU/L (ref 0–40)
Albumin: 4 g/dL (ref 3.7–4.7)
Alkaline Phosphatase: 109 IU/L (ref 44–121)
BUN/Creatinine Ratio: 14 (ref 12–28)
BUN: 16 mg/dL (ref 8–27)
Bilirubin Total: 0.7 mg/dL (ref 0.0–1.2)
CO2: 22 mmol/L (ref 20–29)
Calcium: 9.4 mg/dL (ref 8.7–10.3)
Chloride: 104 mmol/L (ref 96–106)
Creatinine, Ser: 1.15 mg/dL — ABNORMAL HIGH (ref 0.57–1.00)
Globulin, Total: 2.4 g/dL (ref 1.5–4.5)
Glucose: 122 mg/dL — ABNORMAL HIGH (ref 70–99)
Potassium: 5 mmol/L (ref 3.5–5.2)
Sodium: 141 mmol/L (ref 134–144)
Total Protein: 6.4 g/dL (ref 6.0–8.5)
eGFR: 48 mL/min/1.73 — ABNORMAL LOW (ref >59)

## 2023-12-10 ENCOUNTER — Other Ambulatory Visit: Payer: Self-pay | Admitting: *Deleted

## 2023-12-10 ENCOUNTER — Ambulatory Visit: Payer: Self-pay | Admitting: Family

## 2023-12-10 LAB — LAB REPORT - SCANNED
Albumin/Creatinine Ratio, Urine, POC: 30
Creatinine, POC: 0.88 mg/dL
EGFR: 66

## 2023-12-10 NOTE — Patient Outreach (Signed)
 Transition of Care week 2  Visit Note  12/10/2023  Name: Rhonda Garcia MRN: [id number]          DOB: 1941/04/02  Situation: Patient enrolled in [facility] 30-day program. Visit completed with patient by telephone.   Background:    Past Medical History:  Diagnosis Date   A-fib (HCC)    Anal fissure    resolved    Anemia    Back pain    with left radiculopathy   CAD (coronary artery disease)    Cataract    Collagen vascular disease (HCC)    DDD (degenerative disc disease)    Depression    Diabetes mellitus    Diverticulosis    Dyslipidemia    Dysrhythmia    AFib   Esophagitis    GERD (gastroesophageal reflux disease)    Hiatal hernia    Hx of peptic ulcer    Hyperlipidemia    Hypertension    Hypothyroidism    Internal hemorrhoids 2017   NSTEMI (non-ST elevated myocardial infarction) (HCC) 08/05/2015   Osteopenia    Sleep apnea    not using CPAP now, could not tolerate and PCP aware.   Thyroid  disease     Assessment: Patient Reported Symptoms: Cognitive Cognitive Status: No symptoms reported, Alert and oriented to person, place, and time, Able to follow simple commands, Normal speech and language skills      Neurological Neurological Review of Symptoms: No symptoms reported    HEENT HEENT Symptoms Reported: No symptoms reported      Cardiovascular Cardiovascular Symptoms Reported: No symptoms reported Cardiovascular Management Strategies: Routine screening, Adequate rest Cardiovascular Self-Management Outcome: 4 (good) Cardiovascular Comment: Dx.  PAF, HTN,  pt denies any symptoms today  Respiratory Respiratory Symptoms Reported: Shortness of breath Other Respiratory Symptoms: dyspnea with exertion only Respiratory Management Strategies: Adequate rest Respiratory Self-Management Outcome: 4 (good)  Endocrine Endocrine Symptoms Reported: No symptoms reported Is patient diabetic?: Yes Is patient checking blood sugars at home?: Yes List most recent blood sugar  readings, include date and time of day: pt checks CBG on occasion, no readings to report Endocrine Self-Management Outcome: 4 (good) Endocrine Comment: AIC 6.8  Gastrointestinal Gastrointestinal Symptoms Reported: Diarrhea Additional Gastrointestinal Details: denies nausea,  has loose stools at times and formed stools also at times, no abdominal pain, continues using lactulose  for chronic constipation, taking linzess  as prescribed Gastrointestinal Management Strategies: Adequate rest, Medication therapy Gastrointestinal Self-Management Outcome: 3 (uncertain) Gastrointestinal Comment: Reinforced signs/ symptoms SBO, reportable signs /symptoms    Genitourinary Genitourinary Symptoms Reported: No symptoms reported    Integumentary Integumentary Symptoms Reported: No symptoms reported    Musculoskeletal Musculoskelatal Symptoms Reviewed: No symptoms reported        Psychosocial Psychosocial Symptoms Reported: No symptoms reported         There were no vitals filed for this visit.  Medications Reviewed Today     Reviewed by Aura Mliss LABOR, RN (Registered Nurse) on 12/10/23 at 1027  Med List Status: <None>   Medication Order Taking? Sig Documenting Provider Last Dose Status Informant  ALPRAZolam  (XANAX ) 0.5 MG tablet [id number]  Take 1 tablet (0.5 mg total) by mouth daily as needed. Lavell Bari LABOR, FNP  Active Self, Pharmacy Records  apixaban  (ELIQUIS ) 2.5 MG TABS tablet [id number]  Take 1 tablet (2.5 mg total) by mouth 2 (two) times daily. Lavell Bari A, FNP  Active   castor oil liquid [id number]  Take 15 mLs by mouth daily. [provider]  Active Self,  Pharmacy Records  diclofenac  (VOLTAREN ) 75 MG EC tablet [id number]  Take 1 tablet (75 mg total) by mouth 2 (two) times daily. Lavell Bari LABOR, FNP  Active Self, Pharmacy Records  hydroxychloroquine  (PLAQUENIL ) 200 MG tablet [id number]  TAKE ONE (1) TABLET BY MOUTH EVERY DAY Rice, Lonni ORN, MD  Active Self, Pharmacy Records   isosorbide  mononitrate (IMDUR ) 60 MG 24 hr tablet [id number]  Take 60 mg by mouth daily. [provider]  Active Self, Pharmacy Records           Med Note DRENA, CHUCK KANDICE Kitchens Nov 25, 2023  2:55 PM)    lactulose  (CHRONULAC ) 10 GM/15ML solution [id number]  Take 15 mLs (10 g total) by mouth daily as needed for mild constipation or moderate constipation. Lavell Bari LABOR, FNP  Active Self, Pharmacy Records  levothyroxine  (SYNTHROID ) 50 MCG tablet [id number]  TAKE ONE (1) TABLET EACH DAY Hawks, Hackberry A, FNP  Active Self, Pharmacy Records  linaclotide  (LINZESS ) 145 MCG CAPS capsule [id number]  Take 1 capsule (145 mcg total) by mouth daily before breakfast. Tat, Alm, MD  Active   meclizine  (ANTIVERT ) 12.5 MG tablet [id number]  Take 1 tablet (12.5 mg total) by mouth 3 (three) times daily as needed for dizziness (Vertigo). Dettinger, Fonda LABOR, MD  Active Self, Pharmacy Records  metoprolol  tartrate (LOPRESSOR ) 50 MG tablet [id number]  Take 1 tablet (50 mg total) by mouth 2 (two) times daily. Lavell Bari LABOR, FNP  Active Self, Pharmacy Records  Misc Natural Products (COLON CLEANSE PO) [id number]  Take 1 Scoop by mouth daily. [provider]  Active Self, Pharmacy Records  nitroGLYCERIN  (NITROSTAT ) 0.4 MG SL tablet [id number]  Place 1 tablet (0.4 mg total) under the tongue every 5 (five) minutes as needed for chest pain. Lavell Bari LABOR, FNP  Active Self, Pharmacy Records  pantoprazole  (PROTONIX ) 40 MG tablet [id number]  Take 1 tablet (40 mg total) by mouth 2 (two) times daily. Lavell Bari LABOR, FNP  Active Self, Pharmacy Records  polyethylene glycol (MIRALAX  / GLYCOLAX ) 17 g packet [id number]  Take 17 g by mouth daily. [provider]  Active Self, Pharmacy Records  pravastatin  (PRAVACHOL ) 80 MG tablet [id number]  Take 1 tablet (80 mg total) by mouth every evening. Lavell Bari LABOR, FNP  Active Self, Pharmacy Records  Semaglutide  (RYBELSUS ) 7 MG TABS [id number]  Take 1 tablet (7 mg  total) by mouth daily. Lavell Bari LABOR, FNP  Active Self, Pharmacy Records            Goals Addressed             This Visit's Progress    VBCI Transitions of Care (TOC) Care Plan       Problems:  Recent Hospitalization for treatment of Small Bowel Obstruction No Specialist appointment pt states she is able to call and schedule specialist appointment Pt reports she is out shopping at [facility] but is able to talk, pt reports she feels much better, appetite is getting better, has nausea on occasion, has bowel movements daily and has to take lactulose  daily or cannot have a bowel movement, reports this is chronic has been going on a long time, pt states bowel movements are not formed, states similar to diarrhea Pt was not able to have colonoscopy due to  I wasn't cleaned out good enough  plans to follow up on when she will have another colonoscopy 12/10/23- pt states  feeling good  having some formed stools and some diarrhea at times,  continues with lactulose  for chronic constipation, taking linzess  as prescribed, Eliquis  prescribed at 8/29 primary care provider appointment, pt has been out of town on vacation and plans to pickup medication today (states she has taken before and aware of side effects)  Goal:  Over the next 30 days, the patient will not experience hospital readmission  Interventions:   Evaluation of current treatment plan related to SBO, self-management and patient's adherence to plan as established by provider. Discussed plans with patient for ongoing care management follow up and provided patient with direct contact information for care management team Evaluation of current treatment plan related to SBO and patient's adherence to plan as established by provider Advised patient to report any symptoms of SBO to provider immediately Reviewed medications with patient and discussed importance of taking as prescribed Discussed plans with patient for ongoing care  management follow up and provided patient with direct contact information for care management team Reinforced signs/ symptoms of SBO, reportable signs /symptoms Reviewed importance of taking Eliquis  as prescribed  Patient Self Care Activities:  Attend all scheduled provider appointments Attend church or other social activities Call pharmacy for medication refills 3-7 days in advance of running out of medications Call provider office for new concerns or questions  Notify RN Care Manager of TOC call rescheduling needs Participate in Transition of Care Program/Attend TOC scheduled calls Perform all self care activities independently  Perform IADL's (shopping, preparing meals, housekeeping, managing finances) independently Take medications as prescribed   If you have abdominal pain, change in bowel movements, appetite or overall not feeling well, please report to your provider immediately, seek emergency assistance if needed  Plan:  Telephone follow up appointment with care management team member scheduled for:  12/17/23 @ 1030 am The patient has been provided with contact information for the care management team and has been advised to call with any health related questions or concerns.         Recommendation:   PCP Follow-up Continue Current Plan of Care  Follow Up Plan:   Telephone follow-up 12/17/23 @ 1030 am  Mliss Creed [facility], BSN RN Care Manager/ Transition of Care [place]/ [facility] [phone]

## 2023-12-10 NOTE — Patient Instructions (Signed)
 Visit Information  Thank you for taking time to visit with me today. Please don't hesitate to contact me if I can be of assistance to you before our next scheduled telephone appointment.  Our next appointment is by telephone on 12/17/23 @ 1030 am  Following is a copy of your care plan:   Goals Addressed             This Visit's Progress    VBCI Transitions of Care (TOC) Care Plan       Problems:  Recent Hospitalization for treatment of Small Bowel Obstruction No Specialist appointment pt states she is able to call and schedule specialist appointment Pt reports she is out shopping at [facility] but is able to talk, pt reports she feels much better, appetite is getting better, has nausea on occasion, has bowel movements daily and has to take lactulose  daily or cannot have a bowel movement, reports this is chronic has been going on a long time, pt states bowel movements are not formed, states similar to diarrhea Pt was not able to have colonoscopy due to  I wasn't cleaned out good enough  plans to follow up on when she will have another colonoscopy 12/10/23- pt states  feeling good  having some formed stools and some diarrhea at times, continues with lactulose  for chronic constipation, taking linzess  as prescribed, Eliquis  prescribed at 8/29 primary care provider appointment, pt has been out of town on vacation and plans to pickup medication today (states she has taken before and aware of side effects)  Goal:  Over the next 30 days, the patient will not experience hospital readmission  Interventions:   Evaluation of current treatment plan related to SBO, self-management and patient's adherence to plan as established by provider. Discussed plans with patient for ongoing care management follow up and provided patient with direct contact information for care management team Evaluation of current treatment plan related to SBO and patient's adherence to plan as established by provider Advised  patient to report any symptoms of SBO to provider immediately Reviewed medications with patient and discussed importance of taking as prescribed Discussed plans with patient for ongoing care management follow up and provided patient with direct contact information for care management team Reinforced signs/ symptoms of SBO, reportable signs /symptoms Reviewed importance of taking Eliquis  as prescribed  Patient Self Care Activities:  Attend all scheduled provider appointments Attend church or other social activities Call pharmacy for medication refills 3-7 days in advance of running out of medications Call provider office for new concerns or questions  Notify RN Care Manager of TOC call rescheduling needs Participate in Transition of Care Program/Attend TOC scheduled calls Perform all self care activities independently  Perform IADL's (shopping, preparing meals, housekeeping, managing finances) independently Take medications as prescribed   If you have abdominal pain, change in bowel movements, appetite or overall not feeling well, please report to your provider immediately, seek emergency assistance if needed  Plan:  Telephone follow up appointment with care management team member scheduled for:  12/17/23 @ 1030 am The patient has been provided with contact information for the care management team and has been advised to call with any health related questions or concerns.         Patient verbalizes understanding of instructions and care plan provided today and agrees to view in MyChart. Active MyChart status and patient understanding of how to access instructions and care plan via MyChart confirmed with patient.     Telephone follow up appointment  with care management team member scheduled for: 12/17/23 @ 1030 am  Please call the care guide team at [phone] if you need to cancel or reschedule your appointment.   Please call the Suicide and Crisis Lifeline: 988 call the [place]  National  Suicide Prevention Lifeline: 551-823-5908 or TTY: 204-566-0046 TTY 530-818-0248) to talk to a trained counselor call 1-800-273-TALK (toll free, 24 hour hotline) go to [facility] [address], [place] [phone]) call the [facility] Crisis Line: [phone] call 911 if you are experiencing a Mental Health or Behavioral Health Crisis or need someone to talk to.  Mliss Creed [facility], BSN RN Care Manager/ Transition of Care [place]/ [facility] [phone]

## 2023-12-11 ENCOUNTER — Encounter: Payer: Self-pay | Admitting: *Deleted

## 2023-12-16 ENCOUNTER — Other Ambulatory Visit: Payer: Self-pay | Admitting: Family

## 2023-12-16 DIAGNOSIS — M1712 Unilateral primary osteoarthritis, left knee: Secondary | ICD-10-CM

## 2023-12-17 ENCOUNTER — Other Ambulatory Visit: Payer: Self-pay | Admitting: *Deleted

## 2023-12-17 NOTE — Patient Instructions (Signed)
 Visit Information  Thank you for taking time to visit with me today. Please don't hesitate to contact me if I can be of assistance to you before our next scheduled telephone appointment.  Our next appointment is by telephone on 12/24/23 @ 1045 am  Following is a copy of your care plan:   Goals Addressed             This Visit's Progress    VBCI Transitions of Care (TOC) Care Plan       Problems:  Recent Hospitalization for treatment of Small Bowel Obstruction No Specialist appointment pt states she is able to call and schedule specialist appointment Pt reports she is out shopping at [facility] but is able to talk, pt reports she feels much better, appetite is getting better, has nausea on occasion, has bowel movements daily and has to take lactulose  daily or cannot have a bowel movement, reports this is chronic has been going on a long time, pt states bowel movements are not formed, states similar to diarrhea Pt was not able to have colonoscopy due to  I wasn't cleaned out good enough  plans to follow up on when she will have another colonoscopy 12/10/23- pt states  feeling good  having some formed stools and some diarrhea at times, continues with lactulose  for chronic constipation, taking linzess  as prescribed, Eliquis  prescribed at 8/29 primary care provider appointment, pt has been out of town on vacation and plans to pickup medication today (states she has taken before and aware of side effects) 12/17/23- pt reports she had bowel movement on 9/7, did not take lactulose  yesterday 9/8 but will take today, has been taking linzess , has nausea on occasion, pt is taking Eliquis  as prescribed, pt continues checking CBG on occasion  Goal:  Over the next 30 days, the patient will not experience hospital readmission  Interventions:  Evaluation of current treatment plan related to SBO, self-management and patient's adherence to plan as established by provider. Discussed plans with patient for  ongoing care management follow up and provided patient with direct contact information for care management team Evaluation of current treatment plan related to SBO and patient's adherence to plan as established by provider Advised patient to report any symptoms of SBO to provider immediately Reviewed medications with patient and discussed importance of taking as prescribed Discussed plans with patient for ongoing care management follow up and provided patient with direct contact information for care management team Reviewed signs/ symptoms of SBO, reportable signs /symptoms Reinforced importance of taking Eliquis  as prescribed Reviewed importance of taking lactulose  consistently as prescribed for mild-moderate constipation  Patient Self Care Activities:  Attend all scheduled provider appointments Attend church or other social activities Call pharmacy for medication refills 3-7 days in advance of running out of medications Call provider office for new concerns or questions  Notify RN Care Manager of TOC call rescheduling needs Participate in Transition of Care Program/Attend TOC scheduled calls Perform all self care activities independently  Perform IADL's (shopping, preparing meals, housekeeping, managing finances) independently Take medications as prescribed   If you have abdominal pain, change in bowel movements, appetite or overall not feeling well, please report to your provider immediately, seek emergency assistance if needed Take lactulose  as prescribed for mild- moderate constipation  Plan:  Telephone follow up appointment with care management team member scheduled for:  12/24/23 @ 1045 am The patient has been provided with contact information for the care management team and has been advised to call with  any health related questions or concerns.         Patient verbalizes understanding of instructions and care plan provided today and agrees to view in MyChart. Active MyChart  status and patient understanding of how to access instructions and care plan via MyChart confirmed with patient.     Telephone follow up appointment with care management team member scheduled for: 12/24/23 @ 1045 am  Please call the care guide team at [phone] if you need to cancel or reschedule your appointment.   Please call the Suicide and Crisis Lifeline: 988 call the [place]  National Suicide Prevention Lifeline: 515-441-8808 or TTY: 310-526-7874 TTY 743-604-1763) to talk to a trained counselor call 1-800-273-TALK (toll free, 24 hour hotline) go to [facility] [address], [place] [phone]) call the [facility] Crisis Line: [phone] call 911 if you are experiencing a Mental Health or Behavioral Health Crisis or need someone to talk to.  Mliss Creed [facility], BSN RN Care Manager/ Transition of Care [place]/ [facility] [phone]

## 2023-12-17 NOTE — Patient Outreach (Signed)
 Transition of Care week 3  Visit Note  12/17/2023  Name: Rhonda Garcia MRN: [id number]          DOB: 1940-05-23  Situation: Patient enrolled in [facility] 30-day program. Visit completed with patient by telephone.   Background:   Past Medical History:  Diagnosis Date   A-fib (HCC)    Anal fissure    resolved    Anemia    Back pain    with left radiculopathy   CAD (coronary artery disease)    Cataract    Collagen vascular disease (HCC)    DDD (degenerative disc disease)    Depression    Diabetes mellitus    Diverticulosis    Dyslipidemia    Dysrhythmia    AFib   Esophagitis    GERD (gastroesophageal reflux disease)    Hiatal hernia    Hx of peptic ulcer    Hyperlipidemia    Hypertension    Hypothyroidism    Internal hemorrhoids 2017   NSTEMI (non-ST elevated myocardial infarction) (HCC) 08/05/2015   Osteopenia    Sleep apnea    not using CPAP now, could not tolerate and PCP aware.   Thyroid  disease     Assessment: Patient Reported Symptoms: Cognitive Cognitive Status: No symptoms reported, Alert and oriented to person, place, and time, Able to follow simple commands, Normal speech and language skills      Neurological Neurological Review of Symptoms: No symptoms reported    HEENT HEENT Symptoms Reported: No symptoms reported      Cardiovascular Cardiovascular Symptoms Reported: No symptoms reported Does patient have uncontrolled Hypertension?: No Cardiovascular Management Strategies: Routine screening, Adequate rest Cardiovascular Self-Management Outcome: 4 (good) Cardiovascular Comment: Dx. PAF, HTN   pt has new BP wrist cuff but does know if it is correct, plans to take cuff into next MD visit and compare with their cuff  Respiratory Respiratory Symptoms Reported: Shortness of breath Other Respiratory Symptoms: dyspnea with exertion Respiratory Management Strategies: Adequate rest Respiratory Self-Management Outcome: 4 (good)  Endocrine Endocrine Symptoms  Reported: No symptoms reported Is patient diabetic?: Yes Is patient checking blood sugars at home?: Yes List most recent blood sugar readings, include date and time of day: pt checks CBG on occasion,  FBS range 139-155 with a reading on occasion near 200 (pt states she ate cake) Endocrine Self-Management Outcome: 4 (good) Endocrine Comment: AIC 6.8  Gastrointestinal Gastrointestinal Symptoms Reported: Constipation Additional Gastrointestinal Details: pt has nausea on occasion, takes zofran  prn with relief obtained, last BM on 9/7, pt did not take lactulose  on 9/8 but will take today, denies any abdominal pain, taking linzess  as prescribed Gastrointestinal Management Strategies: Adequate rest, Medication therapy Gastrointestinal Self-Management Outcome: 3 (uncertain) Gastrointestinal Comment: Reviewed signs /symptoms SBO, reportable signs /symptoms, reinforced taking lactulose  consistently as prescribed for mild-moderate constipation    Genitourinary Genitourinary Symptoms Reported: No symptoms reported    Integumentary Integumentary Symptoms Reported: No symptoms reported    Musculoskeletal Musculoskelatal Symptoms Reviewed: No symptoms reported        Psychosocial Psychosocial Symptoms Reported: No symptoms reported         There were no vitals filed for this visit.  Medications Reviewed Today     Reviewed by Aura Mliss LABOR, RN (Registered Nurse) on 12/17/23 at 1106  Med List Status: <None>   Medication Order Taking? Sig Documenting Provider Last Dose Status Informant  ALPRAZolam  (XANAX ) 0.5 MG tablet [id number]  Take 1 tablet (0.5 mg total) by mouth daily as needed. Lavell, Berger  A, FNP  Active Self, Pharmacy Records  apixaban  (ELIQUIS ) 2.5 MG TABS tablet [id number]  Take 1 tablet (2.5 mg total) by mouth 2 (two) times daily. Lavell Lye A, FNP  Active   castor oil liquid [id number]  Take 15 mLs by mouth daily. [provider]  Active Self, Pharmacy Records  diclofenac   (VOLTAREN ) 75 MG EC tablet [id number]  TAKE ONE (1) TABLET BY MOUTH TWO (2) TIMES DAILY Lavell Lye A, FNP  Active   hydroxychloroquine  (PLAQUENIL ) 200 MG tablet [id number]  TAKE ONE (1) TABLET BY MOUTH EVERY DAY Rice, Lonni ORN, MD  Active Self, Pharmacy Records  isosorbide  mononitrate (IMDUR ) 60 MG 24 hr tablet [id number]  Take 60 mg by mouth daily. [provider]  Active Self, Pharmacy Records           Med Note DRENA, CHUCK KANDICE Kitchens Nov 25, 2023  2:55 PM)    lactulose  (CHRONULAC ) 10 GM/15ML solution [id number]  Take 15 mLs (10 g total) by mouth daily as needed for mild constipation or moderate constipation. Lavell Lye LABOR, FNP  Active Self, Pharmacy Records  levothyroxine  (SYNTHROID ) 50 MCG tablet [id number]  TAKE ONE (1) TABLET EACH DAY Hawks, [place] A, FNP  Active Self, Pharmacy Records  linaclotide  (LINZESS ) 145 MCG CAPS capsule [id number]  Take 1 capsule (145 mcg total) by mouth daily before breakfast. Tat, Alm, MD  Active   meclizine  (ANTIVERT ) 12.5 MG tablet [id number]  Take 1 tablet (12.5 mg total) by mouth 3 (three) times daily as needed for dizziness (Vertigo). Dettinger, Fonda LABOR, MD  Active Self, Pharmacy Records  metoprolol  tartrate (LOPRESSOR ) 50 MG tablet [id number]  Take 1 tablet (50 mg total) by mouth 2 (two) times daily. Lavell Lye LABOR, FNP  Active Self, Pharmacy Records  Misc Natural Products (COLON CLEANSE PO) [id number]  Take 1 Scoop by mouth daily. [provider]  Active Self, Pharmacy Records  nitroGLYCERIN  (NITROSTAT ) 0.4 MG SL tablet [id number]  Place 1 tablet (0.4 mg total) under the tongue every 5 (five) minutes as needed for chest pain. Lavell Lye LABOR, FNP  Active Self, Pharmacy Records  pantoprazole  (PROTONIX ) 40 MG tablet [id number]  Take 1 tablet (40 mg total) by mouth 2 (two) times daily. Lavell Lye LABOR, FNP  Active Self, Pharmacy Records  polyethylene glycol (MIRALAX  / GLYCOLAX ) 17 g packet [id number]  Take 17 g by mouth daily. [provider]  Active Self, Pharmacy Records  pravastatin  (PRAVACHOL ) 80 MG tablet [id number]  Take 1 tablet (80 mg total) by mouth every evening. Lavell Lye LABOR, FNP  Active Self, Pharmacy Records  Semaglutide  (RYBELSUS ) 7 MG TABS [id number]  Take 1 tablet (7 mg total) by mouth daily. Lavell Lye LABOR, FNP  Active Self, Pharmacy Records            Recommendation:   PCP Follow-up Specialty provider follow-up GI- Dr. Shaaron 12/31/23  Follow Up Plan:   Telephone follow-up 12/24/23 @ 1045 am  Mliss Creed [facility], BSN RN Care Manager/ Transition of Care [place]/ [facility] [phone]

## 2023-12-24 ENCOUNTER — Other Ambulatory Visit: Payer: Self-pay | Admitting: *Deleted

## 2023-12-24 DIAGNOSIS — D84821 Immunodeficiency due to drugs: Secondary | ICD-10-CM | POA: Diagnosis not present

## 2023-12-24 DIAGNOSIS — F411 Generalized anxiety disorder: Secondary | ICD-10-CM | POA: Diagnosis not present

## 2023-12-24 NOTE — Patient Instructions (Signed)
 Visit Information  Thank you for taking time to visit with me today. Please don't hesitate to contact me if I can be of assistance to you before our next scheduled telephone appointment.  Our next appointment is by telephone on 01/01/24 @ 945 am  Following is a copy of your care plan:   Goals Addressed             This Visit's Progress    VBCI Transitions of Care (TOC) Care Plan       Problems:  Recent Hospitalization for treatment of Small Bowel Obstruction No Specialist appointment pt states she is able to call and schedule specialist appointment Pt reports she is out shopping at [facility] but is able to talk, pt reports she feels much better, appetite is getting better, has nausea on occasion, has bowel movements daily and has to take lactulose  daily or cannot have a bowel movement, reports this is chronic has been going on a long time, pt states bowel movements are not formed, states similar to diarrhea Pt was not able to have colonoscopy due to  I wasn't cleaned out good enough  plans to follow up on when she will have another colonoscopy 12/10/23- pt states  feeling good  having some formed stools and some diarrhea at times, continues with lactulose  for chronic constipation, taking linzess  as prescribed, Eliquis  prescribed at 8/29 primary care provider appointment, pt has been out of town on vacation and plans to pickup medication today (states she has taken before and aware of side effects) 12/17/23- pt reports she had bowel movement on 9/7, did not take lactulose  yesterday 9/8 but will take today, has been taking linzess , has nausea on occasion, pt is taking Eliquis  as prescribed, pt continues checking CBG on occasion 12/24/23-pt reports she has been having daily bowel movements, continues taking lactulose  as needed, taking linzess , to follow up with GI Dr. Shaaron on 12/24/23 , no new concerns reported  Goal:  Over the next 30 days, the patient will not experience hospital  readmission  Interventions:  Evaluation of current treatment plan related to SBO, self-management and patient's adherence to plan as established by provider. Discussed plans with patient for ongoing care management follow up and provided patient with direct contact information for care management team Evaluation of current treatment plan related to SBO and patient's adherence to plan as established by provider Advised patient to report any symptoms of SBO to provider immediately Reviewed medications with patient and discussed importance of taking as prescribed Discussed plans with patient for ongoing care management follow up and provided patient with direct contact information for care management team Reinforced signs/ symptoms of SBO, reportable signs /symptoms Reviewed importance of taking Eliquis  as prescribed Reinforced importance of taking lactulose  consistently as prescribed for mild-moderate constipation  Patient Self Care Activities:  Attend all scheduled provider appointments Attend church or other social activities Call pharmacy for medication refills 3-7 days in advance of running out of medications Call provider office for new concerns or questions  Notify RN Care Manager of TOC call rescheduling needs Participate in Transition of Care Program/Attend TOC scheduled calls Perform all self care activities independently  Perform IADL's (shopping, preparing meals, housekeeping, managing finances) independently Take medications as prescribed   If you have abdominal pain, change in bowel movements, appetite or overall not feeling well, please report to your provider immediately, seek emergency assistance if needed Take lactulose  as prescribed for mild- moderate constipation  Plan:  Telephone follow up appointment with care  management team member scheduled for:  01/01/24 @ 945 am The patient has been provided with contact information for the care management team and has been advised  to call with any health related questions or concerns.         Patient verbalizes understanding of instructions and care plan provided today and agrees to view in MyChart. Active MyChart status and patient understanding of how to access instructions and care plan via MyChart confirmed with patient.     Telephone follow up appointment with care management team member scheduled for:  01/01/24 @ 945 am  Please call the care guide team at [phone] if you need to cancel or reschedule your appointment.   Please call the Suicide and Crisis Lifeline: 988 call the [place]  National Suicide Prevention Lifeline: 920-241-6384 or TTY: 772-676-4569 TTY (628)585-5971) to talk to a trained counselor call 1-800-273-TALK (toll free, 24 hour hotline) go to [facility] [address], [place] [phone]) call the [facility] Crisis Line: (531)466-7580 call 911 if you are experiencing a Mental Health or Behavioral Health Crisis or need someone to talk to.  Mliss Creed [facility], BSN RN Care Manager/ Transition of Care / [facility] [phone]

## 2023-12-24 NOTE — Patient Outreach (Signed)
 Transition of Care week 4  Visit Note  12/24/2023  Name: Rhonda Garcia MRN: [id number]          DOB: 1940/11/21  Situation: Patient enrolled in [facility] 30-day program. Visit completed with patient by telephone.   Background:    Past Medical History:  Diagnosis Date   A-fib (HCC)    Anal fissure    resolved    Anemia    Back pain    with left radiculopathy   CAD (coronary artery disease)    Cataract    Collagen vascular disease (HCC)    DDD (degenerative disc disease)    Depression    Diabetes mellitus    Diverticulosis    Dyslipidemia    Dysrhythmia    AFib   Esophagitis    GERD (gastroesophageal reflux disease)    Hiatal hernia    Hx of peptic ulcer    Hyperlipidemia    Hypertension    Hypothyroidism    Internal hemorrhoids 2017   NSTEMI (non-ST elevated myocardial infarction) (HCC) 08/05/2015   Osteopenia    Sleep apnea    not using CPAP now, could not tolerate and PCP aware.   Thyroid  disease     Assessment: Patient Reported Symptoms: Cognitive Cognitive Status: No symptoms reported, Alert and oriented to person, place, and time, Normal speech and language skills, Able to follow simple commands      Neurological Neurological Review of Symptoms: No symptoms reported    HEENT HEENT Symptoms Reported: No symptoms reported      Cardiovascular Cardiovascular Symptoms Reported: No symptoms reported Does patient have uncontrolled Hypertension?: No    Respiratory Respiratory Symptoms Reported: No symptoms reported    Endocrine Endocrine Symptoms Reported: No symptoms reported Is patient diabetic?: Yes Is patient checking blood sugars at home?: Yes List most recent blood sugar readings, include date and time of day: checks CBG on occasion, has no recent readings to report Endocrine Self-Management Outcome: 4 (good) Endocrine Comment: AIC 6.8     reviewed carbohydrate modified diet  Gastrointestinal Gastrointestinal Symptoms Reported:  Constipation Additional Gastrointestinal Details: pt has chronic constipation, denies constipation today, continues to use lactulose  prn and takes linzess  as prescribed Gastrointestinal Management Strategies: Adequate rest, Medication therapy Gastrointestinal Self-Management Outcome: 3 (uncertain) Gastrointestinal Comment: Reinforced signs/ symptoms SBO, reportable signs/ symptoms, reviewed taking lactulose  consistently as prescribed for mild-moderate constipation    Genitourinary Genitourinary Symptoms Reported: No symptoms reported    Integumentary Integumentary Symptoms Reported: No symptoms reported    Musculoskeletal Musculoskelatal Symptoms Reviewed: No symptoms reported        Psychosocial Psychosocial Symptoms Reported: No symptoms reported         There were no vitals filed for this visit.  Medications Reviewed Today     Reviewed by Aura Mliss LABOR, RN (Registered Nurse) on 12/24/23 at 1057  Med List Status: <None>   Medication Order Taking? Sig Documenting Provider Last Dose Status Informant  ALPRAZolam  (XANAX ) 0.5 MG tablet [id number]  Take 1 tablet (0.5 mg total) by mouth daily as needed. Lavell Bari LABOR, FNP  Active Self, Pharmacy Records  apixaban  (ELIQUIS ) 2.5 MG TABS tablet [id number]  Take 1 tablet (2.5 mg total) by mouth 2 (two) times daily. Lavell Bari A, FNP  Active   castor oil liquid [id number]  Take 15 mLs by mouth daily. [provider]  Active Self, Pharmacy Records  diclofenac  (VOLTAREN ) 75 MG EC tablet [id number]  TAKE ONE (1) TABLET BY MOUTH TWO (2) TIMES DAILY Dulac, [place]  A, FNP  Active   hydroxychloroquine  (PLAQUENIL ) 200 MG tablet [id number]  TAKE ONE (1) TABLET BY MOUTH EVERY DAY Rice, Lonni ORN, MD  Active Self, Pharmacy Records  isosorbide  mononitrate (IMDUR ) 60 MG 24 hr tablet [id number]  Take 60 mg by mouth daily. [provider]  Active Self, Pharmacy Records           Med Note DRENA, CHUCK KANDICE Kitchens Nov 25, 2023  2:55 PM)     lactulose  (CHRONULAC ) 10 GM/15ML solution [id number]  Take 15 mLs (10 g total) by mouth daily as needed for mild constipation or moderate constipation. Lavell Bari LABOR, FNP  Active Self, Pharmacy Records  levothyroxine  (SYNTHROID ) 50 MCG tablet [id number]  TAKE ONE (1) TABLET EACH DAY Hawks, [place] A, FNP  Active Self, Pharmacy Records  linaclotide  (LINZESS ) 145 MCG CAPS capsule [id number]  Take 1 capsule (145 mcg total) by mouth daily before breakfast. Tat, Alm, MD  Active   meclizine  (ANTIVERT ) 12.5 MG tablet [id number]  Take 1 tablet (12.5 mg total) by mouth 3 (three) times daily as needed for dizziness (Vertigo). Dettinger, Fonda LABOR, MD  Active Self, Pharmacy Records  metoprolol  tartrate (LOPRESSOR ) 50 MG tablet [id number]  Take 1 tablet (50 mg total) by mouth 2 (two) times daily. Lavell Bari LABOR, FNP  Active Self, Pharmacy Records  Misc Natural Products (COLON CLEANSE PO) [id number]  Take 1 Scoop by mouth daily. [provider]  Active Self, Pharmacy Records  nitroGLYCERIN  (NITROSTAT ) 0.4 MG SL tablet [id number]  Place 1 tablet (0.4 mg total) under the tongue every 5 (five) minutes as needed for chest pain. Lavell Bari LABOR, FNP  Active Self, Pharmacy Records  pantoprazole  (PROTONIX ) 40 MG tablet [id number]  Take 1 tablet (40 mg total) by mouth 2 (two) times daily. Lavell Bari LABOR, FNP  Active Self, Pharmacy Records  polyethylene glycol (MIRALAX  / GLYCOLAX ) 17 g packet [id number]  Take 17 g by mouth daily. [provider]  Active Self, Pharmacy Records  pravastatin  (PRAVACHOL ) 80 MG tablet [id number]  Take 1 tablet (80 mg total) by mouth every evening. Lavell Bari LABOR, FNP  Active Self, Pharmacy Records  Semaglutide  (RYBELSUS ) 7 MG TABS [id number]  Take 1 tablet (7 mg total) by mouth daily. Lavell Bari LABOR, FNP  Active Self, Pharmacy Records            Goals Addressed             This Visit's Progress    VBCI Transitions of Care (TOC) Care Plan       Problems:  Recent  Hospitalization for treatment of Small Bowel Obstruction No Specialist appointment pt states she is able to call and schedule specialist appointment Pt reports she is out shopping at [facility] but is able to talk, pt reports she feels much better, appetite is getting better, has nausea on occasion, has bowel movements daily and has to take lactulose  daily or cannot have a bowel movement, reports this is chronic has been going on a long time, pt states bowel movements are not formed, states similar to diarrhea Pt was not able to have colonoscopy due to  I wasn't cleaned out good enough  plans to follow up on when she will have another colonoscopy 12/10/23- pt states  feeling good  having some formed stools and some diarrhea at times, continues with lactulose  for chronic constipation, taking linzess  as prescribed, Eliquis  prescribed at 8/29 primary care provider appointment, pt has been out of town on vacation and  plans to pickup medication today (states she has taken before and aware of side effects) 12/17/23- pt reports she had bowel movement on 9/7, did not take lactulose  yesterday 9/8 but will take today, has been taking linzess , has nausea on occasion, pt is taking Eliquis  as prescribed, pt continues checking CBG on occasion 12/24/23-pt reports she has been having daily bowel movements, continues taking lactulose  as needed, taking linzess , to follow up with GI Dr. Shaaron on 12/24/23 , no new concerns reported  Goal:  Over the next 30 days, the patient will not experience hospital readmission  Interventions:  Evaluation of current treatment plan related to SBO, self-management and patient's adherence to plan as established by provider. Discussed plans with patient for ongoing care management follow up and provided patient with direct contact information for care management team Evaluation of current treatment plan related to SBO and patient's adherence to plan as established by provider Advised patient  to report any symptoms of SBO to provider immediately Reviewed medications with patient and discussed importance of taking as prescribed Discussed plans with patient for ongoing care management follow up and provided patient with direct contact information for care management team Reinforced signs/ symptoms of SBO, reportable signs /symptoms Reviewed importance of taking Eliquis  as prescribed Reinforced importance of taking lactulose  consistently as prescribed for mild-moderate constipation  Patient Self Care Activities:  Attend all scheduled provider appointments Attend church or other social activities Call pharmacy for medication refills 3-7 days in advance of running out of medications Call provider office for new concerns or questions  Notify RN Care Manager of TOC call rescheduling needs Participate in Transition of Care Program/Attend TOC scheduled calls Perform all self care activities independently  Perform IADL's (shopping, preparing meals, housekeeping, managing finances) independently Take medications as prescribed   If you have abdominal pain, change in bowel movements, appetite or overall not feeling well, please report to your provider immediately, seek emergency assistance if needed Take lactulose  as prescribed for mild- moderate constipation  Plan:  Telephone follow up appointment with care management team member scheduled for:  01/01/24 @ 945 am The patient has been provided with contact information for the care management team and has been advised to call with any health related questions or concerns.          Recommendation:   PCP Follow-up Specialty provider follow-up Dr. Shaaron 12/31/23  Follow Up Plan:   Telephone follow-up 01/01/24 @ 945 am  Mliss Creed [facility], BSN RN Care Manager/ Transition of Care [place]/ [facility] [phone]

## 2023-12-31 ENCOUNTER — Encounter: Payer: Self-pay | Admitting: Internal Medicine

## 2023-12-31 ENCOUNTER — Telehealth: Payer: Self-pay

## 2023-12-31 ENCOUNTER — Ambulatory Visit: Admitting: Internal Medicine

## 2023-12-31 ENCOUNTER — Other Ambulatory Visit: Payer: Self-pay

## 2023-12-31 DIAGNOSIS — K5909 Other constipation: Secondary | ICD-10-CM

## 2023-12-31 DIAGNOSIS — K5900 Constipation, unspecified: Secondary | ICD-10-CM

## 2023-12-31 DIAGNOSIS — R195 Other fecal abnormalities: Secondary | ICD-10-CM

## 2023-12-31 DIAGNOSIS — M154 Erosive (osteo)arthritis: Secondary | ICD-10-CM

## 2023-12-31 MED ORDER — LINACLOTIDE 290 MCG PO CAPS: 290.0000 ug | ORAL_CAPSULE | Freq: Every day | ORAL | 11 refills | Status: AC

## 2023-12-31 NOTE — Progress Notes (Unsigned)
 Gastroenterology Progress Note    Primary Care Physician:  Lavell Bari LABOR, FNP Primary Gastroenterologist:  Dr. Shaaron  Pre-Procedure History & Physical: HPI:  Rhonda Garcia is a 83 y.o. female here for follow-up of obstipation.  Recently hospitalized for same.  Tried on Linzess  290, MiraLAX  felt it was too strong backed down to 145 along with MiraLAX .  She describes a huge swings in bowel function.  She takes a myriad of over-the-counter agents and prescription agent including castor oil, Metamucil, Chronulac , Benefiber, colon cleanse and Linzess  145.  2 bowel movements daily to 1 every 5 days-back and forth.  Has not had any rectal bleeding. Prior attempted colonoscopy thwarted by poor prep.  He is not currently taking any opioids.  No more dysphagia since her esophagus was dilated last year.  Past Medical History:  Diagnosis Date   A-fib (HCC)    Anal fissure    resolved    Anemia    Back pain    with left radiculopathy   CAD (coronary artery disease)    Cataract    Collagen vascular disease    DDD (degenerative disc disease)    Depression    Diabetes mellitus    Diverticulosis    Dyslipidemia    Dysrhythmia    AFib   Esophagitis    GERD (gastroesophageal reflux disease)    Hiatal hernia    Hx of peptic ulcer    Hyperlipidemia    Hypertension    Hypothyroidism    Internal hemorrhoids 2017   NSTEMI (non-ST elevated myocardial infarction) (HCC) 08/05/2015   Osteopenia    Sleep apnea    not using CPAP now, could not tolerate and PCP aware.   Thyroid  disease     Past Surgical History:  Procedure Laterality Date   ABDOMINAL HYSTERECTOMY Bilateral 2001 - approximate   APPENDECTOMY     BREAST REDUCTION SURGERY     CARDIAC CATHETERIZATION N/A 08/08/2015   Procedure: Left Heart Cath and Coronary Angiography;  Surgeon: Lonni JONETTA Cash, MD;  Location: [facility] INVASIVE CV [facility];  Service: Cardiovascular;  Laterality: N/A;   CATARACT EXTRACTION Bilateral     CHOLECYSTECTOMY  2008 - approximate   COLONOSCOPY  06/2007   Friable anal canal and anal papillae. Pancolonic diverticula. Normal terminal ileum. Status post segmental biopsy, descending colon biopsy showed minimal cryptitis. Biopsies felt to be  nonspecific but could be seen with NSAIDs. No features of inflammatory bowel disease. Stool studies were negative.   COLONOSCOPY  03/21/2011   RMR: colonic polyps treated as described above, colonic diverticulosis (Tubular adenomas)   COLONOSCOPY WITH PROPOFOL  N/A 06/17/2014   RMR: Colonic diverticulosis. Multiple colonic polyps removed as described above.    COLONOSCOPY WITH PROPOFOL  N/A 03/19/2016   Procedure: COLONOSCOPY WITH PROPOFOL ;  Surgeon: Lamar CHRISTELLA Shaaron, MD;  Location: [facility];  Service: Endoscopy;  Laterality: N/A;  8:45am   COLONOSCOPY WITH PROPOFOL  N/A 10/02/2018   Procedure: COLONOSCOPY WITH PROPOFOL ;  Surgeon: Shaaron Lamar CHRISTELLA, MD;  Location: [facility];  Service: Endoscopy;  Laterality: N/A;  9:15am   CORONARY ANGIOPLASTY WITH STENT PLACEMENT     4 stents   ESOPHAGEAL DILATION N/A 06/17/2014   Procedure: ESOPHAGEAL DILATION;  Surgeon: Lamar CHRISTELLA Shaaron, MD;  Location: [facility];  Service: Endoscopy;  Laterality: N/AMERL Stai 54 Fr   ESOPHAGOGASTRODUODENOSCOPY  06/2007   Small hiatal hernia   ESOPHAGOGASTRODUODENOSCOPY  03/21/2011   RMR: normal esophagus- status post passage of Maloney dilator. Small hiatal hernia.  Trivial antral and bulbar erosions   ESOPHAGOGASTRODUODENOSCOPY (EGD) WITH PROPOFOL  N/A 06/17/2014   RMR: Small hiatal hernia; otherwise normal EGD. Status post passage of a Maloney dilator. No explanation for patients right upper quadrant abdominal pain.    ESOPHAGOGASTRODUODENOSCOPY (EGD) WITH PROPOFOL  N/A 03/19/2016   Procedure: ESOPHAGOGASTRODUODENOSCOPY (EGD) WITH PROPOFOL ;  Surgeon: Lamar CHRISTELLA Hollingshead, MD;  Location: [facility];  Service: Endoscopy;  Laterality: N/A;   ESOPHAGOGASTRODUODENOSCOPY (EGD) WITH PROPOFOL   N/A 10/12/2022   Procedure: ESOPHAGOGASTRODUODENOSCOPY (EGD) WITH PROPOFOL ;  Surgeon: Hollingshead Lamar CHRISTELLA, MD;  Location: [facility];  Service: Endoscopy;  Laterality: N/A;   FLEXIBLE SIGMOIDOSCOPY N/A 10/12/2022   Procedure: FLEXIBLE SIGMOIDOSCOPY;  Surgeon: Hollingshead Lamar CHRISTELLA, MD;  [facility]: [facility];  Service: Endoscopy;  Laterality: N/A;   KNEE SURGERY Left    arthroscopy   LIPOMA EXCISION  03/17/2012   Procedure: EXCISION LIPOMA;  Surgeon: Lynwood MALVA Pina, MD;  Location: [facility];  Service: General;  Laterality: Right;  excision of right lower back lipoma   MALONEY DILATION N/A 03/19/2016   Procedure: AGAPITO DILATION;  Surgeon: Lamar CHRISTELLA Hollingshead, MD;  [facility];  Service: Endoscopy;  Laterality: N/A;   MALONEY DILATION N/A 10/12/2022   Procedure: AGAPITO DILATION;  Surgeon: Hollingshead Lamar CHRISTELLA, MD;  [facility]: [facility];  Service: Endoscopy;  Laterality: N/A;   PATELLA FRACTURE SURGERY Right    POLYPECTOMY  06/17/2014   Procedure: POLYPECTOMY;  Surgeon: Lamar CHRISTELLA Hollingshead, MD;  Location: [facility];  Service: Endoscopy;;   POLYPECTOMY  10/02/2018   Procedure: POLYPECTOMY;  Surgeon: Hollingshead Lamar CHRISTELLA, MD;  Location: [facility];  Service: Endoscopy;;  colon   REDUCTION MAMMAPLASTY Bilateral     Prior to Admission medications   Medication Sig Start Date End Date Taking? Authorizing Provider  ALPRAZolam  (XANAX ) 0.5 MG tablet Take 1 tablet (0.5 mg total) by mouth daily as needed. 10/18/23  Yes Hawks, Christy A, FNP  apixaban  (ELIQUIS ) 2.5 MG TABS tablet Take 1 tablet (2.5 mg total) by mouth 2 (two) times daily. 12/06/23  Yes Hawks, Christy A, FNP  baclofen  (LIORESAL ) 10 MG tablet Take 5 mg by mouth 3 (three) times daily as needed for muscle spasms.   Yes [provider]  busPIRone  (BUSPAR ) 5 MG tablet Take 5 mg by mouth 3 (three) times daily as needed.   Yes [provider]  castor oil liquid Take 15 mLs by mouth daily.   Yes [provider]   diclofenac  (VOLTAREN ) 75 MG EC tablet TAKE ONE (1) TABLET BY MOUTH TWO (2) TIMES DAILY 12/16/23  Yes Hawks, Christy A, FNP  famotidine  (PEPCID ) 40 MG tablet Take 40 mg by mouth daily.   Yes [provider]  hydroxychloroquine  (PLAQUENIL ) 200 MG tablet TAKE ONE (1) TABLET BY MOUTH EVERY DAY Patient taking differently: Take 200 mg by mouth daily as needed. 12/12/20  Yes Rice, Lonni ORN, MD  isosorbide  mononitrate (IMDUR ) 60 MG 24 hr tablet Take 60 mg by mouth daily.   Yes [provider]  lactulose  (CHRONULAC ) 10 GM/15ML solution Take 15 mLs (10 g total) by mouth daily as needed for mild constipation or moderate constipation. 10/18/23  Yes Hawks, Bari A, FNP  levothyroxine  (SYNTHROID ) 50 MCG tablet TAKE ONE (1) TABLET EACH DAY 06/17/23  Yes Hawks, Christy A, FNP  linaclotide  (LINZESS ) 145 MCG CAPS capsule Take 1 capsule (145 mcg total) by mouth daily before breakfast. 11/30/23  Yes Tat, Alm, MD  lisinopril  (ZESTRIL ) 10 MG  tablet Take 10 mg by mouth daily.   Yes [provider]  meclizine  (ANTIVERT ) 12.5 MG tablet Take 1 tablet (12.5 mg total) by mouth 3 (three) times daily as needed for dizziness (Vertigo). 08/01/23  Yes Dettinger, Fonda LABOR, MD  metoprolol  tartrate (LOPRESSOR ) 50 MG tablet Take 1 tablet (50 mg total) by mouth 2 (two) times daily. 10/18/23  Yes Hawks, Bari LABOR, FNP  Misc Natural Products (COLON CLEANSE PO) Take 1 Scoop by mouth daily.   Yes [provider]  nitroGLYCERIN  (NITROSTAT ) 0.4 MG SL tablet Place 1 tablet (0.4 mg total) under the tongue every 5 (five) minutes as needed for chest pain. 11/21/23  Yes Hawks, Christy A, FNP  ondansetron  (ZOFRAN ) 4 MG tablet Take 4 mg by mouth every 8 (eight) hours as needed for nausea or vomiting.   Yes [provider]  pantoprazole  (PROTONIX ) 40 MG tablet Take 1 tablet (40 mg total) by mouth 2 (two) times daily. 10/18/23  Yes Hawks, Christy A, FNP  polyethylene glycol (MIRALAX  / GLYCOLAX ) 17 g packet  Take 17 g by mouth daily.   Yes [provider]  pravastatin  (PRAVACHOL ) 80 MG tablet Take 1 tablet (80 mg total) by mouth every evening. 10/18/23  Yes Hawks, Christy A, FNP  Semaglutide  (RYBELSUS ) 7 MG TABS Take 1 tablet (7 mg total) by mouth daily. 07/18/23  Yes Hawks, Christy A, FNP  traMADol  (ULTRAM ) 50 MG tablet Take 50 mg by mouth every 6 (six) hours as needed.   Yes [provider]    Allergies as of 12/31/2023 - Review Complete 12/31/2023  Allergen Reaction Noted   Fentanyl  Anaphylaxis 12/10/2020   Promethazine  hcl Anaphylaxis 09/06/2010   Farxiga  [dapagliflozin ] Other (See Comments) 11/01/2022   Jardiance  [empagliflozin ] Other (See Comments) 09/21/2022   Metformin  and related Other (See Comments) 09/21/2022    Family History  Problem Relation Age of Onset   Heart attack Mother    Hypertension Mother    Stroke Mother        Deceased, age 22   Diabetes Sister    Arthritis Sister    Diabetes Sister    Cancer Sister        ovarian   Stroke Sister    Alzheimer's disease Sister    Diabetes Brother        also has a blood disorder    Clotting disorder Brother    Kidney disease Brother    Heart disease Brother    Melanoma Brother        deceased   Diabetes Son    Hypertension Son    Coronary artery disease Son 53       CABG   Stroke Son    Hypertension Son    Coronary artery disease Son 19       CABG   Colon cancer Neg Hx    Breast cancer Neg Hx     Social History   Socioeconomic History   Marital status: Married    Spouse name: Not on file   Number of children: 4   Years of education: 6   Highest education level: 6th grade  Occupational History   Occupation: Retired    Comment: Engineer, site  Tobacco Use   Smoking status: Never   Smokeless tobacco: Never  Vaping Use   Vaping status: Never Used  Substance and Sexual Activity   Alcohol use: Not Currently    Alcohol/week: 1.0 standard drink of alcohol    Types: 1 Glasses of wine  per  week   Drug use: No   Sexual activity: Not Currently    Birth control/protection: Post-menopausal  Other Topics Concern   Not on file  Social History Narrative    Has 4 adult children. She lives home with one of her sons who recently had a stroke and needs 24 hour care. She is the primary caregiver and is now legally separated from her husband.    Social Drivers of Corporate investment banker Strain: Low Risk  (10/15/2023)   Overall Financial Resource Strain (CARDIA)    Difficulty of Paying Living Expenses: Not hard at all  Food Insecurity: No Food Insecurity (12/02/2023)   Hunger Vital Sign    Worried About Running Out of Food in the Last Year: Never true    Ran Out of Food in the Last Year: Never true  Transportation Needs: No Transportation Needs (12/02/2023)   PRAPARE - Administrator, Civil Service (Medical): No    Lack of Transportation (Non-Medical): No  Physical Activity: Insufficiently Active (10/15/2023)   Exercise Vital Sign    Days of Exercise per Week: 4 days    Minutes of Exercise per Session: 10 min  Stress: No Stress Concern Present (10/15/2023)   [name] of Occupational Health - Occupational Stress Questionnaire    Feeling of Stress: Not at all  Social Connections: Unknown (11/25/2023)   Social Connection and Isolation Panel    Frequency of Communication with Friends and Family: More than three times a week    Frequency of Social Gatherings with Friends and Family: Three times a week    Attends Religious Services: 1 to 4 times per year    Active Member of Clubs or Organizations: No    Attends Banker Meetings: 1 to 4 times per year    Marital Status: Patient declined  Recent Concern: Social Connections - Moderately Isolated (10/15/2023)   Social Connection and Isolation Panel    Frequency of Communication with Friends and Family: More than three times a week    Frequency of Social Gatherings with Friends and Family: More than three times  a week    Attends Religious Services: More than 4 times per year    Active Member of [employer] or Organizations: No    Attends Banker Meetings: Never    Marital Status: Widowed  Intimate Partner Violence: Not At Risk (12/02/2023)   Humiliation, Afraid, Rape, and Kick questionnaire    Fear of Current or Ex-Partner: No    Emotionally Abused: No    Physically Abused: No    Sexually Abused: No    Review of Systems   See HPI, otherwise negative ROS  Physical Exam: BP 138/80 (BP Location: Right Arm, Patient Position: Sitting, Cuff Size: Normal)   Pulse 91   Temp 97.8 F (36.6 C) (Oral)   Ht 5' 2 (1.575 m)   Wt 161 lb (73 kg)   SpO2 98%   BMI 29.45 kg/m  General:   Alert,  Well-developed, well-nourished, pleasant and cooperative in NAD Neck:  Supple; no masses or thyromegaly. No significant cervical adenopathy. Lungs:  Clear throughout to auscultation.   No wheezes, crackles, or rhonchi. No acute distress. Heart:  Regular rate and rhythm; no murmurs, clicks, rubs,  or gallops. Abdomen: Non-distended, normal bowel sounds.  Soft and nontender without appreciable mass or hepatosplenomegaly.   Impression/Plan:   83 year old lady with chronic constipation recently admitted to the hospital exacerbation.  She basically takes the kitchen  sink as far as laxatives are concerned.  She has not on the recommended regimen with from her hospitalization (Linzess  and MiraLAX ).  Constipation likely multifactorial in etiology cannot rule out pelvic floor dysfunction, drug effect, excetra.  I told her we needed to start with a clean Sl[place].  There will be some trial and error to optimize bowel function but need to well-defined the agents she is taking.   Recommendations:  I would like you to stop taking Chronulac  Benefiber castor oil and colon cleanse.  Begin Linzess  291 capsule daily dispense 30 with 11 refills  Take Linzess  in the morning.  In the evening take MiraLAX  17 g  orally-1 capful in 8 ounces water   Stick with this regimen for 3 weeks.  Keep up with your stooling every day with a calendar  Call me in 3 weeks and let me know how you are doing and we will go from there.  Any bowel regiment may need to be adjusted.  Continue Zofran  40 mg orally twice daily.  Plan to see you back in the office in 3 months     Notice: This dictation was prepared with Dragon dictation along with smaller phrase technology. Any transcriptional errors that result from this process are unintentional and may not be corrected upon review.

## 2023-12-31 NOTE — Patient Instructions (Signed)
 Nice to see you again today  We need to start from scratch your bowel regimen  I would like you to stop taking Chronulac  Benefiber castor oil and colon cleanse.  Begin Linzess  291 capsule daily dispense 30 with 11 refills  Take Linzess  in the morning.  In the evening take MiraLAX  17 g orally-1 capful in 8 ounces water   Stick with this regimen for 3 weeks.  Keep up with your stooling every day with a calendar  Call me in 3 weeks and let me know how you are doing and we will go from there.  Any bowel regiment may need to be adjusted.  Continue Zofran  40 mg orally twice daily.  Plan to see you back in the office in 3 months

## 2024-01-01 ENCOUNTER — Other Ambulatory Visit: Payer: Self-pay | Admitting: *Deleted

## 2024-01-01 NOTE — Patient Instructions (Signed)
 Visit Information  Thank you for taking time to visit with me today. Please don't hesitate to contact me if I can be of assistance to you before our next scheduled telephone appointment.  Our next appointment is no further scheduled appointments.    Following is a copy of your care plan:   Goals Addressed             This Visit's Progress    COMPLETED: [facility] Transitions of Care (TOC) Care Plan       Problems:  Recent Hospitalization for treatment of Small Bowel Obstruction No Specialist appointment pt states she is able to call and schedule specialist appointment Pt reports she is out shopping at [facility] but is able to talk, pt reports she feels much better, appetite is getting better, has nausea on occasion, has bowel movements daily and has to take lactulose  daily or cannot have a bowel movement, reports this is chronic has been going on a long time, pt states bowel movements are not formed, states similar to diarrhea Pt was not able to have colonoscopy due to  I wasn't cleaned out good enough  plans to follow up on when she will have another colonoscopy 12/10/23- pt states  feeling good  having some formed stools and some diarrhea at times, continues with lactulose  for chronic constipation, taking linzess  as prescribed, Eliquis  prescribed at 8/29 primary care provider appointment, pt has been out of town on vacation and plans to pickup medication today (states she has taken before and aware of side effects) 12/17/23- pt reports she had bowel movement on 9/7, did not take lactulose  yesterday 9/8 but will take today, has been taking linzess , has nausea on occasion, pt is taking Eliquis  as prescribed, pt continues checking CBG on occasion 12/24/23-pt reports she has been having daily bowel movements, continues taking lactulose  as needed, taking linzess , to follow up with GI Dr. Shaaron on 12/24/23 , no new concerns reported 01/01/24- pt reports she is feeling well, getting outdoors and going more  places,  having daily bowel movements, saw GI Dr. Shaaron 9/23, linzess  increased to 290 mg, continues taking lactulose , miralax  as prescribed, no new concerns reported, pt to call Dr. Shaaron in 3 weeks to report how she is doing with increase in linzess  and follow up for visit in 3 months  Goal:  Over the next 30 days, the patient will not experience hospital readmission  Interventions:  Evaluation of current treatment plan related to SBO, self-management and patient's adherence to plan as established by provider. Discussed plans with patient for ongoing care management follow up and provided patient with direct contact information for care management team Evaluation of current treatment plan related to SBO and patient's adherence to plan as established by provider Advised patient to report any symptoms of SBO to provider immediately Reviewed medications with patient and discussed importance of taking as prescribed Discussed plans with patient for ongoing care management follow up and provided patient with direct contact information for care management team Reviewed signs/ symptoms of SBO, reportable signs /symptoms Reinforced importance of taking Eliquis  as prescribed Reviewed importance of taking lactulose  consistently as prescribed for mild-moderate constipation Reviewed plan of care with pt including case closure  Patient Self Care Activities:  Attend all scheduled provider appointments Attend church or other social activities Call pharmacy for medication refills 3-7 days in advance of running out of medications Call provider office for new concerns or questions  Notify RN Care Manager of TOC call rescheduling needs Participate  in Transition of Care Program/Attend TOC scheduled calls Perform all self care activities independently  Perform IADL's (shopping, preparing meals, housekeeping, managing finances) independently Take medications as prescribed   If you have abdominal pain, change  in bowel movements, appetite or overall not feeling well, please report to your provider immediately, seek emergency assistance if needed Take lactulose  / miralax  as prescribed for mild- moderate constipation Case closure  Plan:  No further follow up required: case closure The patient has been provided with contact information for the care management team and has been advised to call with any health related questions or concerns.         Patient verbalizes understanding of instructions and care plan provided today and agrees to view in MyChart. Active MyChart status and patient understanding of how to access instructions and care plan via MyChart confirmed with patient.     No further follow up required: case closure  Please call the care guide team at [phone] if you need to cancel or reschedule your appointment.   Please call the Suicide and Crisis Lifeline: 988 call the [place]  National Suicide Prevention Lifeline: 989-649-3871 or TTY: 408 691 8385 TTY 430-696-6628) to talk to a trained counselor call 1-800-273-TALK (toll free, 24 hour hotline) go to [facility] [address], [place] [phone]) call the [facility] Crisis Line: [phone] call 911 if you are experiencing a Mental Health or Behavioral Health Crisis or need someone to talk to.  Mliss Creed [facility], BSN RN Care Manager/ Transition of Care [place]/ [facility] [phone]

## 2024-01-01 NOTE — Patient Outreach (Signed)
 Transition of Care week 5  Visit Note  01/01/2024  Name: Rhonda Garcia MRN: [id number]          DOB: Mar 15, 1941  Situation: Patient enrolled in [facility] 30-day program. Visit completed with patient by telephone.   Background:    Past Medical History:  Diagnosis Date   A-fib (HCC)    Anal fissure    resolved    Anemia    Back pain    with left radiculopathy   CAD (coronary artery disease)    Cataract    Collagen vascular disease    DDD (degenerative disc disease)    Depression    Diabetes mellitus    Diverticulosis    Dyslipidemia    Dysrhythmia    AFib   Esophagitis    GERD (gastroesophageal reflux disease)    Hiatal hernia    Hx of peptic ulcer    Hyperlipidemia    Hypertension    Hypothyroidism    Internal hemorrhoids 2017   NSTEMI (non-ST elevated myocardial infarction) (HCC) 08/05/2015   Osteopenia    Sleep apnea    not using CPAP now, could not tolerate and PCP aware.   Thyroid  disease     Assessment: Patient Reported Symptoms: Cognitive Cognitive Status: No symptoms reported, Alert and oriented to person, place, and time, Normal speech and language skills, Able to follow simple commands      Neurological Neurological Review of Symptoms: No symptoms reported    HEENT HEENT Symptoms Reported: No symptoms reported      Cardiovascular Cardiovascular Symptoms Reported: No symptoms reported Does patient have uncontrolled Hypertension?: No Cardiovascular Comment: Dx. PAF, HTN  pt is not checking BP with wrist cuff, plans to take with her to primary care provider and compare with their cuff  Respiratory Respiratory Symptoms Reported: No symptoms reported    Endocrine Endocrine Symptoms Reported: No symptoms reported Is patient diabetic?: Yes Is patient checking blood sugars at home?: Yes List most recent blood sugar readings, include date and time of day: checks CBG only on occasion, checked fasting this morning 163, ate banana pudding and cantaloupe  yesterday evening Endocrine Self-Management Outcome: 4 (good) Endocrine Comment: AIC 6.8   reinforced carbohydrate modified diet  Gastrointestinal Gastrointestinal Symptoms Reported: Constipation Additional Gastrointestinal Details: pt has chronic constipation, denies constipation today, had BM this morning, taking linzess  as prescribed, lactulose  and miralax  Gastrointestinal Management Strategies: Adequate rest, Medication therapy Gastrointestinal Self-Management Outcome: 3 (uncertain) Gastrointestinal Comment: pt saw GI Dr. Shaaron 12/31/23.  linzess  increased to 290 mg, reviewed importance of taking medications as prescribed    Genitourinary Genitourinary Symptoms Reported: No symptoms reported    Integumentary Integumentary Symptoms Reported: No symptoms reported    Musculoskeletal Musculoskelatal Symptoms Reviewed: No symptoms reported        Psychosocial Psychosocial Symptoms Reported: No symptoms reported         There were no vitals filed for this visit.  Medications Reviewed Today     Reviewed by Aura Mliss LABOR, RN (Registered Nurse) on 01/01/24 at 1005  Med List Status: <None>   Medication Order Taking? Sig Documenting Provider Last Dose Status Informant  ALPRAZolam  (XANAX ) 0.5 MG tablet [id number]  Take 1 tablet (0.5 mg total) by mouth daily as needed. Lavell Bari LABOR, FNP  Active Self, Pharmacy Records  apixaban  (ELIQUIS ) 2.5 MG TABS tablet [id number]  Take 1 tablet (2.5 mg total) by mouth 2 (two) times daily. Lavell Bari A, FNP  Active   baclofen  (LIORESAL ) 10 MG tablet [id number]  Take 5 mg by mouth 3 (three) times daily as needed for muscle spasms. [provider]  Active   busPIRone  (BUSPAR ) 5 MG tablet [id number]  Take 5 mg by mouth 3 (three) times daily as needed. [provider]  Active   castor oil liquid [id number]  Take 15 mLs by mouth daily. [provider]  Active Self, Pharmacy Records  diclofenac  (VOLTAREN ) 75 MG EC tablet [id number]   TAKE ONE (1) TABLET BY MOUTH TWO (2) TIMES DAILY Hawks, Christy A, FNP  Active   famotidine  (PEPCID ) 40 MG tablet [id number]  Take 40 mg by mouth daily. [provider]  Active   hydroxychloroquine  (PLAQUENIL ) 200 MG tablet [id number]  TAKE ONE (1) TABLET BY MOUTH EVERY DAY  Patient taking differently: Take 200 mg by mouth daily as needed.   Jeannetta Lonni ORN, MD  Active Self, Pharmacy Records  isosorbide  mononitrate (IMDUR ) 60 MG 24 hr tablet [id number]  Take 60 mg by mouth daily. [provider]  Active Self, Pharmacy Records           Med Note DRENA, CHUCK KANDICE Kitchens Nov 25, 2023  2:55 PM)    lactulose  (CHRONULAC ) 10 GM/15ML solution [id number]  Take 15 mLs (10 g total) by mouth daily as needed for mild constipation or moderate constipation. Lavell Bari LABOR, FNP  Active Self, Pharmacy Records  levothyroxine  (SYNTHROID ) 50 MCG tablet [id number]  TAKE ONE (1) TABLET EACH DAY Hawks, [place] A, FNP  Active Self, Pharmacy Records  linaclotide  (LINZESS ) 290 MCG CAPS capsule [id number]  Take 1 capsule (290 mcg total) by mouth daily before breakfast for 30 doses. Shaaron Lamar HERO, MD  Active   lisinopril  (ZESTRIL ) 10 MG tablet [id number]  Take 10 mg by mouth daily. [provider]  Active   meclizine  (ANTIVERT ) 12.5 MG tablet [id number]  Take 1 tablet (12.5 mg total) by mouth 3 (three) times daily as needed for dizziness (Vertigo). Dettinger, Fonda LABOR, MD  Active Self, Pharmacy Records  metoprolol  tartrate (LOPRESSOR ) 50 MG tablet [id number]  Take 1 tablet (50 mg total) by mouth 2 (two) times daily. Lavell Bari LABOR, FNP  Active Self, Pharmacy Records  Misc Natural Products (COLON CLEANSE PO) [id number]  Take 1 Scoop by mouth daily. [provider]  Active Self, Pharmacy Records  nitroGLYCERIN  (NITROSTAT ) 0.4 MG SL tablet [id number]  Place 1 tablet (0.4 mg total) under the tongue every 5 (five) minutes as needed for chest pain. Lavell Bari LABOR, FNP  Active Self, Pharmacy Records   ondansetron  (ZOFRAN ) 4 MG tablet [id number]  Take 4 mg by mouth every 8 (eight) hours as needed for nausea or vomiting. [provider]  Active   pantoprazole  (PROTONIX ) 40 MG tablet [id number]  Take 1 tablet (40 mg total) by mouth 2 (two) times daily. Lavell Bari LABOR, FNP  Active Self, Pharmacy Records  polyethylene glycol (MIRALAX  / GLYCOLAX ) 17 g packet [id number]  Take 17 g by mouth daily. [provider]  Active Self, Pharmacy Records  pravastatin  (PRAVACHOL ) 80 MG tablet [id number]  Take 1 tablet (80 mg total) by mouth every evening. Lavell Bari LABOR, FNP  Active Self, Pharmacy Records  Semaglutide  (RYBELSUS ) 7 MG TABS [id number]  Take 1 tablet (7 mg total) by mouth daily. Lavell Bari LABOR, FNP  Active Self, Pharmacy Records  traMADol  (ULTRAM ) 50 MG tablet [id number]  Take 50 mg by mouth every 6 (six) hours as needed. [provider]  Active  Goals Addressed             This Visit's Progress    COMPLETED: [facility] Transitions of Care (TOC) Care Plan       Problems:  Recent Hospitalization for treatment of Small Bowel Obstruction No Specialist appointment pt states she is able to call and schedule specialist appointment Pt reports she is out shopping at [facility] but is able to talk, pt reports she feels much better, appetite is getting better, has nausea on occasion, has bowel movements daily and has to take lactulose  daily or cannot have a bowel movement, reports this is chronic has been going on a long time, pt states bowel movements are not formed, states similar to diarrhea Pt was not able to have colonoscopy due to  I wasn't cleaned out good enough  plans to follow up on when she will have another colonoscopy 12/10/23- pt states  feeling good  having some formed stools and some diarrhea at times, continues with lactulose  for chronic constipation, taking linzess  as prescribed, Eliquis  prescribed at 8/29 primary care provider appointment, pt has been  out of town on vacation and plans to pickup medication today (states she has taken before and aware of side effects) 12/17/23- pt reports she had bowel movement on 9/7, did not take lactulose  yesterday 9/8 but will take today, has been taking linzess , has nausea on occasion, pt is taking Eliquis  as prescribed, pt continues checking CBG on occasion 12/24/23-pt reports she has been having daily bowel movements, continues taking lactulose  as needed, taking linzess , to follow up with GI Dr. Shaaron on 12/24/23 , no new concerns reported 01/01/24- pt reports she is feeling well, getting outdoors and going more places,  having daily bowel movements, saw GI Dr. Shaaron 9/23, linzess  increased to 290 mg, continues taking lactulose , miralax  as prescribed, no new concerns reported, pt to call Dr. Shaaron in 3 weeks to report how she is doing with increase in linzess  and follow up for visit in 3 months  Goal:  Over the next 30 days, the patient will not experience hospital readmission  Interventions:  Evaluation of current treatment plan related to SBO, self-management and patient's adherence to plan as established by provider. Discussed plans with patient for ongoing care management follow up and provided patient with direct contact information for care management team Evaluation of current treatment plan related to SBO and patient's adherence to plan as established by provider Advised patient to report any symptoms of SBO to provider immediately Reviewed medications with patient and discussed importance of taking as prescribed Discussed plans with patient for ongoing care management follow up and provided patient with direct contact information for care management team Reviewed signs/ symptoms of SBO, reportable signs /symptoms Reinforced importance of taking Eliquis  as prescribed Reviewed importance of taking lactulose  consistently as prescribed for mild-moderate constipation Reviewed plan of care with pt including  case closure  Patient Self Care Activities:  Attend all scheduled provider appointments Attend church or other social activities Call pharmacy for medication refills 3-7 days in advance of running out of medications Call provider office for new concerns or questions  Notify RN Care Manager of TOC call rescheduling needs Participate in Transition of Care Program/Attend TOC scheduled calls Perform all self care activities independently  Perform IADL's (shopping, preparing meals, housekeeping, managing finances) independently Take medications as prescribed   If you have abdominal pain, change in bowel movements, appetite or overall not feeling well, please report to your provider immediately, seek emergency assistance  if needed Take lactulose  / miralax  as prescribed for mild- moderate constipation Case closure  Plan:  No further follow up required: case closure The patient has been provided with contact information for the care management team and has been advised to call with any health related questions or concerns.         Recommendation:   PCP Follow-up Specialty provider follow-up Dr. Shaaron  Follow Up Plan:   Closing From:  Transitions of Care Program  Mliss Creed [facility], BSN RN Care Manager/ Transition of Care [place] [phone]

## 2024-01-20 ENCOUNTER — Ambulatory Visit: Admitting: Family

## 2024-01-22 ENCOUNTER — Telehealth: Payer: Self-pay

## 2024-01-22 DIAGNOSIS — G8929 Other chronic pain: Secondary | ICD-10-CM

## 2024-01-22 NOTE — Telephone Encounter (Signed)
 She is wanting the OrthoVisc injections if her insurance will approve them, but until then she wants to know if she could get steroid injections in her knees?  Please advise  [phone]

## 2024-01-22 NOTE — Telephone Encounter (Signed)
 Spoke w/ pt and let her know if we do steroid injection it will delay when she can get there gel injections after authorized. Pt states she will wait for gel approval. Let pt know that request has been submitted and we will contact her once we get approval. Pt verbalized understanding.

## 2024-01-23 ENCOUNTER — Encounter: Payer: Self-pay | Admitting: Family

## 2024-01-23 ENCOUNTER — Ambulatory Visit (INDEPENDENT_AMBULATORY_CARE_PROVIDER_SITE_OTHER): Admitting: Family

## 2024-01-23 VITALS — BP 138/83 | HR 78 | Temp 97.4°F | Ht 62.0 in | Wt 159.2 lb

## 2024-01-23 DIAGNOSIS — E039 Hypothyroidism, unspecified: Secondary | ICD-10-CM

## 2024-01-23 DIAGNOSIS — E1169 Type 2 diabetes mellitus with other specified complication: Secondary | ICD-10-CM

## 2024-01-23 DIAGNOSIS — K219 Gastro-esophageal reflux disease without esophagitis: Secondary | ICD-10-CM | POA: Diagnosis not present

## 2024-01-23 DIAGNOSIS — E1159 Type 2 diabetes mellitus with other circulatory complications: Secondary | ICD-10-CM | POA: Diagnosis not present

## 2024-01-23 DIAGNOSIS — Z23 Encounter for immunization: Secondary | ICD-10-CM | POA: Diagnosis not present

## 2024-01-23 DIAGNOSIS — F411 Generalized anxiety disorder: Secondary | ICD-10-CM | POA: Diagnosis not present

## 2024-01-23 DIAGNOSIS — I480 Paroxysmal atrial fibrillation: Secondary | ICD-10-CM

## 2024-01-23 DIAGNOSIS — I10 Essential (primary) hypertension: Secondary | ICD-10-CM

## 2024-01-23 DIAGNOSIS — F331 Major depressive disorder, recurrent, moderate: Secondary | ICD-10-CM

## 2024-01-23 DIAGNOSIS — M0579 Rheumatoid arthritis with rheumatoid factor of multiple sites without organ or systems involvement: Secondary | ICD-10-CM

## 2024-01-23 DIAGNOSIS — I2510 Atherosclerotic heart disease of native coronary artery without angina pectoris: Secondary | ICD-10-CM

## 2024-01-23 DIAGNOSIS — K5900 Constipation, unspecified: Secondary | ICD-10-CM

## 2024-01-23 DIAGNOSIS — I152 Hypertension secondary to endocrine disorders: Secondary | ICD-10-CM

## 2024-01-23 DIAGNOSIS — M1712 Unilateral primary osteoarthritis, left knee: Secondary | ICD-10-CM

## 2024-01-23 DIAGNOSIS — I252 Old myocardial infarction: Secondary | ICD-10-CM

## 2024-01-23 MED ORDER — ALPRAZOLAM 0.5 MG PO TABS: 0.5000 mg | ORAL_TABLET | Freq: Every day | ORAL | 2 refills | Status: DC | PRN

## 2024-01-23 MED ORDER — METOPROLOL TARTRATE 50 MG PO TABS: 50.0000 mg | ORAL_TABLET | Freq: Two times a day (BID) | ORAL | 1 refills | Status: DC

## 2024-01-23 MED ORDER — FAMOTIDINE 40 MG PO TABS: 40.0000 mg | ORAL_TABLET | Freq: Every day | ORAL | 1 refills | Status: DC

## 2024-01-23 MED ORDER — LISINOPRIL 10 MG PO TABS: 10.0000 mg | ORAL_TABLET | Freq: Every day | ORAL | 1 refills | Status: DC

## 2024-01-23 MED ORDER — PRAVASTATIN SODIUM 80 MG PO TABS: 80.0000 mg | ORAL_TABLET | Freq: Every evening | ORAL | 1 refills | Status: DC

## 2024-01-23 MED ORDER — PANTOPRAZOLE SODIUM 40 MG PO TBEC: 40.0000 mg | DELAYED_RELEASE_TABLET | Freq: Two times a day (BID) | ORAL | 1 refills | Status: DC

## 2024-01-23 MED ORDER — DICLOFENAC SODIUM 75 MG PO TBEC: 75.0000 mg | DELAYED_RELEASE_TABLET | Freq: Two times a day (BID) | ORAL | 1 refills | Status: DC

## 2024-01-23 NOTE — Progress Notes (Signed)
 Subjective:    Patient ID: Rhonda Garcia Phlegm, female    DOB: [birth year], 83 y.o.   MRN: [id number]  Chief Complaint  Patient presents with   Medical Management of Chronic Issues   PT presents to the office today for chronic follow up.   She is followed by Cardiologists every other year for A Fib, hx NSTEMI, and CAD. Takes Eliquis .    She is followed by GI as needed for constipation, GERD.   She had elevated rheumatoid arthritis and saw a rheumatologists. She states her pain is a 9 out 10.   She is followed by Ortho for left knee pain and getting gel injection.    Her husband passed away 15-May-2023. Since grieving this.  Hypertension This is a chronic problem. The current episode started more than 1 year ago. The problem has been resolved since onset. The problem is controlled. Associated symptoms include anxiety, malaise/fatigue (some times) and peripheral edema. Pertinent negatives include no blurred vision or shortness of breath (when walking distances). Risk factors for coronary artery disease include dyslipidemia, diabetes mellitus, obesity, sedentary lifestyle and post-menopausal state. The current treatment provides moderate improvement. Identifiable causes of hypertension include a thyroid  problem.  Gastroesophageal Reflux She complains of belching and heartburn. She reports no hoarse voice. This is a chronic problem. The current episode started more than 1 year ago. The problem occurs occasionally. The symptoms are aggravated by certain foods. Associated symptoms include fatigue. She has tried a PPI for the symptoms. The treatment provided moderate relief.  Diabetes She presents for her follow-up diabetic visit. She has type 2 diabetes mellitus. Hypoglycemia symptoms include nervousness/anxiousness. Associated symptoms include fatigue and foot paresthesias. Pertinent negatives for diabetes include no blurred vision. Symptoms are stable. Diabetic complications include peripheral  neuropathy. Risk factors for coronary artery disease include dyslipidemia, diabetes mellitus, hypertension, sedentary lifestyle and post-menopausal. She is following a generally healthy diet. Her overall blood glucose range is 140-180 mg/dl. (Does not check glucose regularly )  Thyroid  Problem Presents for follow-up visit. Symptoms include anxiety, constipation, depressed mood and fatigue. Patient reports no diarrhea or hoarse voice. The symptoms have been stable.  Hyperlipidemia This is a chronic problem. The current episode started more than 1 year ago. The problem is controlled. Recent lipid tests were reviewed and are normal. Exacerbating diseases include obesity. Pertinent negatives include no shortness of breath (when walking distances). Current antihyperlipidemic treatment includes statins. The current treatment provides moderate improvement of lipids. Risk factors for coronary artery disease include dyslipidemia, diabetes mellitus, hypertension, a sedentary lifestyle and post-menopausal.  Arthritis Presents for follow-up visit. She complains of pain and stiffness. The symptoms have been stable. Affected locations include the left knee, right knee and right elbow. Her pain is at a severity of 9/10. Associated symptoms include fatigue. Pertinent negatives include no diarrhea.  Anxiety Presents for follow-up visit. Symptoms include depressed mood, excessive worry, insomnia, irritability, nervous/anxious behavior and restlessness. Patient reports no shortness of breath (when walking distances). Symptoms occur occasionally. The severity of symptoms is moderate.    Depression        This is a chronic problem.  The current episode started more than 1 year ago.   The problem occurs intermittently.  Associated symptoms include fatigue, insomnia, restlessness and sad.  Associated symptoms include no helplessness and no hopelessness.  Past medical history includes thyroid  problem and anxiety.    Constipation This is a chronic problem. The current episode started more than 1 year ago. The problem has  been resolved since onset. Her stool frequency is 1 time per day. Pertinent negatives include no diarrhea. She has tried diet changes, stool softeners, fiber and laxatives (linzess ) for the symptoms. The treatment provided moderate relief.    Current opioids rx- Ultram  50 mg # meds rx- 20 Effectiveness of current meds-stable Adverse reactions from pain meds-constipation Morphine  equivalent- 15  Pill count performed-No Last drug screen - 10/18/23 ( high risk q106m, moderate risk q50m, low risk yearly ) Urine drug screen today- Yes Was the N[facility]SR reviewed- yes  If yes were their any concerning findings? - none Pain contract signed on: 10/18/23    Review of Systems  Constitutional:  Positive for fatigue, irritability and malaise/fatigue (some times).  HENT:  Negative for hoarse voice.   Eyes:  Negative for blurred vision.  Respiratory:  Negative for shortness of breath (when walking distances).   Gastrointestinal:  Positive for constipation and heartburn. Negative for diarrhea.  Musculoskeletal:  Positive for stiffness.  Psychiatric/Behavioral:  The patient is nervous/anxious and has insomnia.   All other systems reviewed and are negative.  Family History  Problem Relation Age of Onset   Heart attack Mother    Hypertension Mother    Stroke Mother        Deceased, age 74   Diabetes Sister    Arthritis Sister    Diabetes Sister    Cancer Sister        ovarian   Stroke Sister    Alzheimer's disease Sister    Diabetes Brother        also has a blood disorder    Clotting disorder Brother    Kidney disease Brother    Heart disease Brother    Melanoma Brother        deceased   Diabetes Son    Hypertension Son    Coronary artery disease Son 36       CABG   Stroke Son    Hypertension Son    Coronary artery disease Son 49       CABG   Colon cancer Neg Hx    Breast cancer  Neg Hx    Social History   Socioeconomic History   Marital status: Married    Spouse name: Not on file   Number of children: 4   Years of education: 6   Highest education level: 6th grade  Occupational History   Occupation: Retired    Comment: Engineer, site  Tobacco Use   Smoking status: Never   Smokeless tobacco: Never  Vaping Use   Vaping status: Never Used  Substance and Sexual Activity   Alcohol use: Not Currently    Alcohol/week: 1.0 standard drink of alcohol    Types: 1 Glasses of wine per week   Drug use: No   Sexual activity: Not Currently    Birth control/protection: Post-menopausal  Other Topics Concern   Not on file  Social History Narrative    Has 4 adult children. She lives home with one of her sons who recently had a stroke and needs 24 hour care. She is the primary caregiver and is now legally separated from her husband.    Social Drivers of Corporate investment banker Strain: Low Risk  (10/15/2023)   Overall Financial Resource Strain (CARDIA)    Difficulty of Paying Living Expenses: Not hard at all  Food Insecurity: No Food Insecurity (12/02/2023)   Hunger Vital Sign    Worried About Running Out of Food in the Last  Year: Never true    Ran Out of Food in the Last Year: Never true  Transportation Needs: No Transportation Needs (12/02/2023)   PRAPARE - Administrator, Civil Service (Medical): No    Lack of Transportation (Non-Medical): No  Physical Activity: Insufficiently Active (10/15/2023)   Exercise Vital Sign    Days of Exercise per Week: 4 days    Minutes of Exercise per Session: 10 min  Stress: No Stress Concern Present (10/15/2023)   [name] of Occupational Health - Occupational Stress Questionnaire    Feeling of Stress: Not at all  Social Connections: Unknown (11/25/2023)   Social Connection and Isolation Panel    Frequency of Communication with Friends and Family: More than three times a week    Frequency of Social Gatherings  with Friends and Family: Three times a week    Attends Religious Services: 1 to 4 times per year    Active Member of Clubs or Organizations: No    Attends Banker Meetings: 1 to 4 times per year    Marital Status: Patient declined  Recent Concern: Social Connections - Moderately Isolated (10/15/2023)   Social Connection and Isolation Panel    Frequency of Communication with Friends and Family: More than three times a week    Frequency of Social Gatherings with Friends and Family: More than three times a week    Attends Religious Services: More than 4 times per year    Active Member of [employer] or Organizations: No    Attends Banker Meetings: Never    Marital Status: Widowed       Objective:   Physical Exam Vitals reviewed.  Constitutional:      General: She is not in acute distress.    Appearance: She is well-developed. She is obese.  HENT:     Head: Normocephalic and atraumatic.     Right Ear: Tympanic membrane normal.     Left Ear: Tympanic membrane normal.  Eyes:     Pupils: Pupils are equal, round, and reactive to light.  Neck:     Thyroid : No thyromegaly.  Cardiovascular:     Rate and Rhythm: Normal rate and regular rhythm.     Heart sounds: Normal heart sounds. No murmur heard. Pulmonary:     Effort: Pulmonary effort is normal. No respiratory distress.     Breath sounds: Normal breath sounds. No wheezing.  Abdominal:     General: Bowel sounds are normal. There is no distension.     Palpations: Abdomen is soft.     Tenderness: There is no abdominal tenderness.  Musculoskeletal:        General: Tenderness present.     Cervical back: Normal range of motion and neck supple.     Comments: Pain in left knee flexion and extension   Skin:    General: Skin is warm and dry.  Neurological:     Mental Status: She is alert and oriented to person, place, and time.     Cranial Nerves: No cranial nerve deficit.     Deep Tendon Reflexes: Reflexes are normal  and symmetric.  Psychiatric:        Behavior: Behavior normal.        Thought Content: Thought content normal.        Judgment: Judgment normal.      BP 138/83   Pulse 78   Temp (!) 97.4 F (36.3 C) (Temporal)   Ht 5' 2 (1.575 m)  Wt 159 lb 3.2 oz (72.2 kg)   SpO2 97%   BMI 29.12 kg/m      Assessment & Plan:  RASHAD OBEID comes in today with chief complaint of Medical Management of Chronic Issues   Diagnosis and orders addressed: 1. GAD (generalized anxiety disorder) - ALPRAZolam  (XANAX ) 0.5 MG tablet; Take 1 tablet (0.5 mg total) by mouth daily as needed.  Dispense: 20 tablet; Refill: 2 - CMP14+EGFR  2. Hypertension associated with diabetes (HCC) - metoprolol  tartrate (LOPRESSOR ) 50 MG tablet; Take 1 tablet (50 mg total) by mouth 2 (two) times daily.  Dispense: 180 tablet; Refill: 1 - CMP14+EGFR  3. Gastroesophageal reflux disease without esophagitis - pantoprazole  (PROTONIX ) 40 MG tablet; Take 1 tablet (40 mg total) by mouth 2 (two) times daily.  Dispense: 180 tablet; Refill: 1 - famotidine  (PEPCID ) 40 MG tablet; Take 1 tablet (40 mg total) by mouth daily.  Dispense: 90 tablet; Refill: 1 - CMP14+EGFR  4. Hyperlipidemia associated with type 2 diabetes mellitus (HCC) - pravastatin  (PRAVACHOL ) 80 MG tablet; Take 1 tablet (80 mg total) by mouth every evening.  Dispense: 90 tablet; Refill: 1 - CMP14+EGFR  5. Encounter for immunization (Primary) - Flu vaccine HIGH DOSE PF(Fluzone Trivalent)  6. Rheumatoid arthritis with rheumatoid factor of multiple sites without organ or systems involvement (HCC) - diclofenac  (VOLTAREN ) 75 MG EC tablet; Take 1 tablet (75 mg total) by mouth 2 (two) times daily.  Dispense: 180 tablet; Refill: 1 - CMP14+EGFR  7. Constipation, unspecified constipation type - CMP14+EGFR  8. Type 2 diabetes mellitus with other specified complication, without long-term current use of insulin  (HCC) - CMP14+EGFR  9. Paroxysmal atrial fibrillation  (HCC) - CMP14+EGFR  10. Hypothyroidism, unspecified type - CMP14+EGFR  11. Hx of non-ST elevation myocardial infarction (NSTEMI)  - CMP14+EGFR  12. Essential hypertension - lisinopril  (ZESTRIL ) 10 MG tablet; Take 1 tablet (10 mg total) by mouth daily.  Dispense: 90 tablet; Refill: 1 - CMP14+EGFR  13. Moderate episode of recurrent major depressive disorder (HCC) - CMP14+EGFR  14. Coronary artery disease due to lipid rich plaque - CMP14+EGFR  15. Primary osteoarthritis of left knee - diclofenac  (VOLTAREN ) 75 MG EC tablet; Take 1 tablet (75 mg total) by mouth 2 (two) times daily.  Dispense: 180 tablet; Refill: 1 - CMP14+EGFR   Continue Linzess  290 mcg daily and Miralax   Force fluids Patient reviewed in [place] controlled database, no flags noted. Contract and drug screen are up to date.  Continue current medications  Health Maintenance reviewed Diet and exercise encouraged  Follow up plan: 3 months  Bari Learn, FNP

## 2024-01-23 NOTE — Patient Instructions (Signed)
 Insomnia Insomnia is a sleep disorder that makes it difficult to fall asleep or stay asleep. Insomnia can cause fatigue, low energy, difficulty concentrating, mood swings, and poor performance at work or school. There are three different ways to classify insomnia: Difficulty falling asleep. Difficulty staying asleep. Waking up too early in the morning. Any type of insomnia can be long-term (chronic) or short-term (acute). Both are common. Short-term insomnia usually lasts for 3 months or less. Chronic insomnia occurs at least three times a week for longer than 3 months. What are the causes? Insomnia may be caused by another condition, situation, or substance, such as: Having certain mental health conditions, such as anxiety and depression. Using caffeine, alcohol , tobacco, or drugs. Having gastrointestinal conditions, such as gastroesophageal reflux disease (GERD). Having certain medical conditions. These include: Asthma. Alzheimer's disease. Stroke. Chronic pain. An overactive thyroid  gland (hyperthyroidism). Other sleep disorders, such as restless legs syndrome and sleep apnea. Menopause. Sometimes, the cause of insomnia may not be known. What increases the risk? Risk factors for insomnia include: Gender. Females are affected more often than males. Age. Insomnia is more common as people get older. Stress and certain medical and mental health conditions. Lack of exercise. Having an irregular work schedule. This may include working night shifts and traveling between different time zones. What are the signs or symptoms? If you have insomnia, the main symptom is having trouble falling asleep or having trouble staying asleep. This may lead to other symptoms, such as: Feeling tired or having low energy. Feeling nervous about going to sleep. Not feeling rested in the morning. Having trouble concentrating. Feeling irritable, anxious, or depressed. How is this diagnosed? This condition  may be diagnosed based on: Your symptoms and medical history. Your health care provider may ask about: Your sleep habits. Any medical conditions you have. Your mental health. A physical exam. How is this treated? Treatment for insomnia depends on the cause. Treatment may focus on treating an underlying condition that is causing the insomnia. Treatment may also include: Medicines to help you sleep. Counseling or therapy. Lifestyle adjustments to help you sleep better. Follow these instructions at home: Eating and drinking  Limit or avoid alcohol , caffeinated beverages, and products that contain nicotine and tobacco, especially close to bedtime. These can disrupt your sleep. Do not eat a large meal or eat spicy foods right before bedtime. This can lead to digestive discomfort that can make it hard for you to sleep. Sleep habits  Keep a sleep diary to help you and your health care provider figure out what could be causing your insomnia. Write down: When you sleep. When you wake up during the night. How well you sleep and how rested you feel the next day. Any side effects of medicines you are taking. What you eat and drink. Make your bedroom a dark, comfortable place where it is easy to fall asleep. Put up shades or blackout curtains to block light from outside. Use a white noise machine to block noise. Keep the temperature cool. Limit screen use before bedtime. This includes: Not watching TV. Not using your smartphone, tablet, or computer. Stick to a routine that includes going to bed and waking up at the same times every day and night. This can help you fall asleep faster. Consider making a quiet activity, such as reading, part of your nighttime routine. Try to avoid taking naps during the day so that you sleep better at night. Get out of bed if you are still awake after  15 minutes of trying to sleep. Keep the lights down, but try reading or doing a quiet activity. When you feel  sleepy, go back to bed. General instructions Take over-the-counter and prescription medicines only as told by your health care provider. Exercise regularly as told by your health care provider. However, avoid exercising in the hours right before bedtime. Use relaxation techniques to manage stress. Ask your health care provider to suggest some techniques that may work well for you. These may include: Breathing exercises. Routines to release muscle tension. Visualizing peaceful scenes. Make sure that you drive carefully. Do not drive if you feel very sleepy. Keep all follow-up visits. This is important. Contact a health care provider if: You are tired throughout the day. You have trouble in your daily routine due to sleepiness. You continue to have sleep problems, or your sleep problems get worse. Get help right away if: You have thoughts about hurting yourself or someone else. Get help right away if you feel like you may hurt yourself or others, or have thoughts about taking your own life. Go to your nearest emergency room or: Call 911. Call the National Suicide Prevention Lifeline at 2232757840 or 988. This is open 24 hours a day. Text the Crisis Text Line at 657-529-4371. Summary Insomnia is a sleep disorder that makes it difficult to fall asleep or stay asleep. Insomnia can be long-term (chronic) or short-term (acute). Treatment for insomnia depends on the cause. Treatment may focus on treating an underlying condition that is causing the insomnia. Keep a sleep diary to help you and your health care provider figure out what could be causing your insomnia. This information is not intended to replace advice given to you by your health care provider. Make sure you discuss any questions you have with your health care provider. Document Revised: 03/06/2021 Document Reviewed: 03/06/2021 Elsevier Patient Education  2024 [employer].

## 2024-01-24 ENCOUNTER — Ambulatory Visit: Payer: Self-pay | Admitting: Family

## 2024-01-24 LAB — CMP14+EGFR
ALT: 12 IU/L (ref 0–32)
AST: 20 IU/L (ref 0–40)
Albumin: 3.7 g/dL (ref 3.7–4.7)
Alkaline Phosphatase: 107 IU/L (ref 48–129)
BUN/Creatinine Ratio: 11 — ABNORMAL LOW (ref 12–28)
BUN: 12 mg/dL (ref 8–27)
Bilirubin Total: 0.5 mg/dL (ref 0.0–1.2)
CO2: 22 mmol/L (ref 20–29)
Calcium: 9.2 mg/dL (ref 8.7–10.3)
Chloride: 109 mmol/L — ABNORMAL HIGH (ref 96–106)
Creatinine, Ser: 1.05 mg/dL — ABNORMAL HIGH (ref 0.57–1.00)
Globulin, Total: 2.3 g/dL (ref 1.5–4.5)
Glucose: 109 mg/dL — ABNORMAL HIGH (ref 70–99)
Potassium: 4.5 mmol/L (ref 3.5–5.2)
Sodium: 145 mmol/L — ABNORMAL HIGH (ref 134–144)
Total Protein: 6 g/dL (ref 6.0–8.5)
eGFR: 53 mL/min/1.73 — ABNORMAL LOW (ref >59)

## 2024-02-03 ENCOUNTER — Telehealth: Payer: Self-pay

## 2024-02-03 NOTE — Telephone Encounter (Signed)
 Pt called with an update on taking miralax  and linzess . Pt states that she has been going to the bathroom better but that she is having loose stools. Pt is wanting to know if you want her to continue both or stop the miralax . Please advise.

## 2024-02-04 ENCOUNTER — Telehealth: Payer: Self-pay | Admitting: Pharmacist

## 2024-02-04 NOTE — Telephone Encounter (Signed)
 Pt was made aware and verbalized understanding.

## 2024-02-04 NOTE — Telephone Encounter (Signed)
 Rybelsus  7mg  daily 4 month supply up front for pick up.  Informed patient that Novo nordisk PAP will no longer carry Rybelsus .  She is not a candidate for metformin  or SGLT2s.  We will follow up in January to see what will be affordable for her.

## 2024-02-10 ENCOUNTER — Encounter: Payer: Self-pay | Admitting: Radiology

## 2024-03-03 ENCOUNTER — Other Ambulatory Visit: Payer: Self-pay

## 2024-03-03 ENCOUNTER — Ambulatory Visit: Admitting: Orthopedic Surgery

## 2024-03-03 ENCOUNTER — Encounter: Payer: Self-pay | Admitting: Orthopedic Surgery

## 2024-03-03 ENCOUNTER — Encounter: Payer: Self-pay | Admitting: Internal Medicine

## 2024-03-03 DIAGNOSIS — G8929 Other chronic pain: Secondary | ICD-10-CM | POA: Diagnosis not present

## 2024-03-03 DIAGNOSIS — M1712 Unilateral primary osteoarthritis, left knee: Secondary | ICD-10-CM

## 2024-03-03 DIAGNOSIS — M170 Bilateral primary osteoarthritis of knee: Secondary | ICD-10-CM

## 2024-03-03 DIAGNOSIS — M25561 Pain in right knee: Secondary | ICD-10-CM

## 2024-03-03 MED ORDER — METHYLPREDNISOLONE ACETATE 40 MG/ML IJ SUSP
40.0000 mg | Freq: Once | INTRAMUSCULAR | Status: AC
  Administered 2024-03-03: 40 mg via INTRA_ARTICULAR

## 2024-03-03 MED ORDER — HYALURONAN 30 MG/2ML IX SOSY
30.0000 mg | PREFILLED_SYRINGE | Freq: Once | INTRA_ARTICULAR | Status: AC
  Administered 2024-03-03: 30 mg via INTRA_ARTICULAR

## 2024-03-03 NOTE — Progress Notes (Signed)
 Return patient Visit  Assessment: Rhonda Garcia is a 83 y.o. female with the following: 1. Arthritis of left knee 2.  Arthritis of right knee   Plan: Rhonda Garcia has advanced degenerative changes in the left knee with similar findings in the right knee.  She is having pain in both knees.  She is here for her first HA injection of the left.  She is also interested in an injection of the right knee.  I have offered her steroid injection.  This was completed in clinic today.  I will see her in 1 week for follow-up for her left knee.  This patient is diagnosed with osteoarthritis of the knee(s).    Radiographs show evidence of joint space narrowing, osteophytes, subchondral sclerosis and/or subchondral cysts.  This patient has knee pain which interferes with functional and activities of daily living.    This patient has experienced inadequate response, adverse effects and/or intolerance with conservative treatments such as acetaminophen , NSAIDS, topical creams, physical therapy or regular exercise, knee bracing and/or weight loss.   This patient has experienced inadequate response or has a contraindication to intra articular steroid injections for at least 3 months.   This patient is not scheduled to have a total knee replacement within 6 months of starting treatment with viscosupplementation.  Procedure note injection Left knee joint   Verbal consent was obtained to inject the right knee joint  Timeout was completed to confirm the site of injection.  The skin was prepped with alcohol and ethyl chloride was sprayed at the injection site.  A 21-gauge needle was used to inject Orthovisc hyaluronic acid into the right knee using an anterolateral approach.  There were no complications. A sterile bandage was applied.  Procedure note injection Right knee joint   Verbal consent was obtained to inject the right knee joint  Timeout was completed to confirm the site of injection.  The  skin was prepped with alcohol and ethyl chloride was sprayed at the injection site.  A 21-gauge needle was used to inject 40 mg of Depo-Medrol  and 1% lidocaine  (4 cc) into the right knee using an anterolateral approach.  There were no complications. A sterile bandage was applied.    Follow-up: No follow-ups on file.  Subjective:  Chief Complaint  Patient presents with   Injections    Orthovisc #1- wants xray of both knees. Says it is getting worse.     History of Present Illness: Rhonda Garcia is a 83 y.o. female who returns for evaluation of left knee pain.  I saw her in clinic approximately 6 months ago, at which time we completed a course of HA.  She would like to repeat these injections.  In addition, she is having pain in the right knee.  She describes the pain in both legs as an ache.  It keeps her up at night.  She is not interested in surgery.  Review of Systems: No fevers or chills No numbness or tingling No chest pain No shortness of breath No bowel or bladder dysfunction No GI distress No headaches    Objective: There were no vitals taken for this visit.  Physical Exam:  General: Elderly female., Alert and oriented., and No acute distress. Gait: Left sided antalgic gait.  Evaluation left knee demonstrates a moderate effusion.  Varus alignment overall.  She has a Baker's cyst and tenderness in the posterior aspect of the knee.  5-10 degrees short of full extension.  She tolerates flexion to  100 degrees.  No increased laxity varus or valgus stress.  Right knee without swelling.  Range of motion from 3-120 degrees.  No increased laxity varus or valgus stress.  IMAGING:  X-rays of bilateral knees were obtained in clinic today.  No acute injuries are noted.  Degenerative changes noted bilaterally.  She has osteophytes within the patellofemoral joint bilaterally.  There is decreased joint space within the medial compartment.  No bony lesions.  Impression:  Moderate to severe bilateral knee arthritis  New Medications:  No orders of the defined types were placed in this encounter.     Oneil DELENA Horde, MD  03/03/2024 1:29 PM

## 2024-03-03 NOTE — Patient Instructions (Signed)

## 2024-03-03 NOTE — Addendum Note (Signed)
 Addended by: MARCINE HUSBAND T on: 03/03/2024 02:55 PM   Modules accepted: Orders

## 2024-03-10 ENCOUNTER — Ambulatory Visit: Admitting: Orthopedic Surgery

## 2024-03-10 ENCOUNTER — Encounter: Payer: Self-pay | Admitting: Orthopedic Surgery

## 2024-03-10 DIAGNOSIS — M1712 Unilateral primary osteoarthritis, left knee: Secondary | ICD-10-CM

## 2024-03-10 NOTE — Progress Notes (Signed)
 Return patient Visit  Assessment: Rhonda Garcia is a 83 y.o. female with the following: 1. Arthritis of left knee  Plan: Rhonda Garcia has advanced degenerative changes in the left knee.  She had her first hyaluronic acid injection in the left knee a week ago.  She is ready to proceed with another injection today.  This was completed without issues.  She will follow-up in 1 week for the final injection.   This patient is diagnosed with osteoarthritis of the knee(s).    Radiographs show evidence of joint space narrowing, osteophytes, subchondral sclerosis and/or subchondral cysts.  This patient has knee pain which interferes with functional and activities of daily living.    This patient has experienced inadequate response, adverse effects and/or intolerance with conservative treatments such as acetaminophen , NSAIDS, topical creams, physical therapy or regular exercise, knee bracing and/or weight loss.   This patient has experienced inadequate response or has a contraindication to intra articular steroid injections for at least 3 months.   This patient is not scheduled to have a total knee replacement within 6 months of starting treatment with viscosupplementation.  Procedure note injection Left knee joint   Verbal consent was obtained to inject the right knee joint  Timeout was completed to confirm the site of injection.  The skin was prepped with alcohol and ethyl chloride was sprayed at the injection site.  A 21-gauge needle was used to inject Orthovisc hyaluronic acid into the right knee using an anterolateral approach.  There were no complications. A sterile bandage was applied.   Follow-up: Return in about 1 week (around 03/17/2024).  Subjective:  Chief Complaint  Patient presents with   Injections    Left knee orthovisc    History of Present Illness: Rhonda Garcia is a 83 y.o. female who returns for evaluation of left knee pain.  I have seen her several  times for pain in the left knee.  At the last visit, she had a right knee steroid injection.  In addition, she had an injection in the left knee for hyaluronic acid.  She notes some soreness in the left knee after the injection.  Otherwise, she is doing well.    Review of Systems: No fevers or chills No numbness or tingling No chest pain No shortness of breath No bowel or bladder dysfunction No GI distress No headaches    Objective: There were no vitals taken for this visit.  Physical Exam:  General: Elderly female., Alert and oriented., and No acute distress. Gait: Left sided antalgic gait.  Evaluation left knee demonstrates a moderate effusion.  Varus alignment overall.  She has a Baker's cyst and tenderness in the posterior aspect of the knee.  5-10 degrees short of full extension.  She tolerates flexion to 100 degrees.  No increased laxity varus or valgus stress.   IMAGING:  No new imaging obtained today.  New Medications:  No orders of the defined types were placed in this encounter.     Rhonda DELENA Horde, MD  03/10/2024 10:32 AM

## 2024-03-17 ENCOUNTER — Ambulatory Visit: Admitting: Orthopedic Surgery

## 2024-03-18 ENCOUNTER — Ambulatory Visit: Attending: Orthopedic Surgery | Admitting: Physical Therapy

## 2024-03-18 NOTE — Therapy (Deleted)
 OUTPATIENT PHYSICAL THERAPY LOWER EXTREMITY EVALUATION   Patient Name: Rhonda Garcia MRN: [id number] DOB:06-28-1940, 83 y.o., female Today's Date: 03/18/2024  END OF SESSION:   Past Medical History:  Diagnosis Date   A-fib [facility])    Anal fissure    resolved    Anemia    Back pain    with left radiculopathy   CAD (coronary artery disease)    Cataract    Collagen vascular disease    DDD (degenerative disc disease)    Depression    Diabetes mellitus    Diverticulosis    Dyslipidemia    Dysrhythmia    AFib   Esophagitis    GERD (gastroesophageal reflux disease)    Hiatal hernia    Hx of peptic ulcer    Hyperlipidemia    Hypertension    Hypothyroidism    Internal hemorrhoids 2017   NSTEMI (non-ST elevated myocardial infarction) (HCC) 08/05/2015   Osteopenia    Sleep apnea    not using CPAP now, could not tolerate and PCP aware.   Thyroid  disease    Past Surgical History:  Procedure Laterality Date   ABDOMINAL HYSTERECTOMY Bilateral 2001 - approximate   APPENDECTOMY     BREAST REDUCTION SURGERY     CARDIAC CATHETERIZATION N/A 08/08/2015   Procedure: Left Heart Cath and Coronary Angiography;  Surgeon: Lonni JONETTA Cash, MD;  Location: [facility] INVASIVE CV LAB;  Service: Cardiovascular;  Laterality: N/A;   CATARACT EXTRACTION Bilateral    CHOLECYSTECTOMY  2008 - approximate   COLONOSCOPY  06/2007   Friable anal canal and anal papillae. Pancolonic diverticula. Normal terminal ileum. Status post segmental biopsy, descending colon biopsy showed minimal cryptitis. Biopsies felt to be  nonspecific but could be seen with NSAIDs. No features of inflammatory bowel disease. Stool studies were negative.   COLONOSCOPY  03/21/2011   RMR: colonic polyps treated as described above, colonic diverticulosis (Tubular adenomas)   COLONOSCOPY WITH PROPOFOL  N/A 06/17/2014   RMR: Colonic diverticulosis. Multiple colonic polyps removed as described above.    COLONOSCOPY WITH PROPOFOL   N/A 03/19/2016   Procedure: COLONOSCOPY WITH PROPOFOL ;  Surgeon: Lamar CHRISTELLA Hollingshead, MD;  Location: [facility];  Service: Endoscopy;  Laterality: N/A;  8:45am   COLONOSCOPY WITH PROPOFOL  N/A 10/02/2018   Procedure: COLONOSCOPY WITH PROPOFOL ;  Surgeon: Hollingshead Lamar CHRISTELLA, MD;  Location: [facility];  Service: Endoscopy;  Laterality: N/A;  9:15am   CORONARY ANGIOPLASTY WITH STENT PLACEMENT     4 stents   ESOPHAGEAL DILATION N/A 06/17/2014   Procedure: ESOPHAGEAL DILATION;  Surgeon: Lamar CHRISTELLA Hollingshead, MD;  Location: [facility];  Service: Endoscopy;  Laterality: N/AMERL Stai 54 Fr   ESOPHAGOGASTRODUODENOSCOPY  06/2007   Small hiatal hernia   ESOPHAGOGASTRODUODENOSCOPY  03/21/2011   RMR: normal esophagus- status post passage of Maloney dilator. Small hiatal hernia. Trivial antral and bulbar erosions   ESOPHAGOGASTRODUODENOSCOPY (EGD) WITH PROPOFOL  N/A 06/17/2014   RMR: Small hiatal hernia; otherwise normal EGD. Status post passage of a Maloney dilator. No explanation for patients right upper quadrant abdominal pain.    ESOPHAGOGASTRODUODENOSCOPY (EGD) WITH PROPOFOL  N/A 03/19/2016   Procedure: ESOPHAGOGASTRODUODENOSCOPY (EGD) WITH PROPOFOL ;  Surgeon: Lamar CHRISTELLA Hollingshead, MD;  Location: [facility];  Service: Endoscopy;  Laterality: N/A;   ESOPHAGOGASTRODUODENOSCOPY (EGD) WITH PROPOFOL  N/A 10/12/2022   Procedure: ESOPHAGOGASTRODUODENOSCOPY (EGD) WITH PROPOFOL ;  Surgeon: Hollingshead Lamar CHRISTELLA, MD;  Location: [facility];  Service: Endoscopy;  Laterality: N/A;   FLEXIBLE SIGMOIDOSCOPY N/A 10/12/2022   Procedure: FLEXIBLE SIGMOIDOSCOPY;  Surgeon: Shaaron Lamar HERO, MD;  Location: [facility];  Service: Endoscopy;  Laterality: N/A;   KNEE SURGERY Left    arthroscopy   LIPOMA EXCISION  03/17/2012   Procedure: EXCISION LIPOMA;  Surgeon: Lynwood MALVA Pina, MD;  Location: [facility];  Service: General;  Laterality: Right;  excision of right lower back lipoma   MALONEY DILATION N/A 03/19/2016   Procedure: AGAPITO  DILATION;  Surgeon: Lamar HERO Shaaron, MD;  [facility];  Service: Endoscopy;  Laterality: N/A;   MALONEY DILATION N/A 10/12/2022   Procedure: AGAPITO DILATION;  Surgeon: Shaaron Lamar HERO, MD;  Location: [facility];  Service: Endoscopy;  Laterality: N/A;   PATELLA FRACTURE SURGERY Right    POLYPECTOMY  06/17/2014   Procedure: POLYPECTOMY;  Surgeon: Lamar HERO Shaaron, MD;  Location: [facility];  Service: Endoscopy;;   POLYPECTOMY  10/02/2018   Procedure: POLYPECTOMY;  Surgeon: Shaaron Lamar HERO, MD;  Location: [facility];  Service: Endoscopy;;  colon   REDUCTION MAMMAPLASTY Bilateral    Patient Active Problem List   Diagnosis Date Noted   Rheumatoid arthritis with rheumatoid factor of multiple sites without organ or systems involvement (HCC) 01/23/2024   Adynamic ileus (HCC) 11/26/2023   Generalized abdominal pain 11/26/2023   Acute lateral meniscal tear 01/01/2021   Instability of right knee joint 12/15/2020   Rheumatoid factor positive 10/19/2020   Erosive osteoarthritis of both hands 10/19/2020   Back pain with sciatica 05/04/2020   Bilateral primary osteoarthritis of knee 01/28/2020   Diabetes mellitus (HCC) 12/19/2018   Hx of non-ST elevation myocardial infarction (NSTEMI) 12/19/2018   Anemia 07/22/2018   Vertigo 06/27/2016   GAD (generalized anxiety disorder) 10/27/2015   Coronary artery disease    Hx of adenomatous colonic polyps 06/22/2015   Hypothyroidism 01/17/2015   Vitamin D  deficiency 09/09/2014   Hiatal hernia    Dysphagia, pharyngoesophageal phase    History of colonic polyps    Diverticulosis of colon without hemorrhage    Paroxysmal atrial fibrillation (HCC) 11/02/2013   Depression 10/28/2012   Lipoma of back 02/12/2012   Constipation 02/28/2011   Esophageal dysphagia 02/28/2011   Gastroesophageal reflux 02/28/2011   POSITIONAL VERTIGO 04/29/2009   Osteoarthritis 12/23/2008   Type 2 diabetes mellitus with hyperlipidemia (HCC) 12/21/2008   Essential  hypertension 12/21/2008    PCP: Lavell Bari LABOR, FNP  REFERRING PROVIDER: Onesimo Oneil LABOR, MD  REFERRING DIAG: M25.561,M25.562,G89.29 (ICD-10-CM) - Chronic pain of both knees  THERAPY DIAG:  No diagnosis found.  Rationale for Evaluation and Treatment: Rehabilitation  ONSET DATE: ***  SUBJECTIVE:   SUBJECTIVE STATEMENT: ***  PERTINENT HISTORY: *** PAIN:  Are you having pain? {OPRCPAIN:27236}  PRECAUTIONS: {Therapy precautions:24002}  RED FLAGS: {PT Red Flags:29287}   WEIGHT BEARING RESTRICTIONS: {Yes ***/No:24003}  FALLS:  Has patient fallen in last 6 months? {fallsyesno:27318}  LIVING ENVIRONMENT: Lives with: {OPRC lives with:25569::lives with their family} Lives in: {Lives in:25570} Stairs: {opstairs:27293} Has following equipment at home: {Assistive devices:23999}  OCCUPATION: ***  PLOF: {PLOF:24004}  PATIENT GOALS: ***  NEXT MD VISIT: ***  OBJECTIVE:  Note: Objective measures were completed at Evaluation unless otherwise noted.  DIAGNOSTIC FINDINGS: ***  PATIENT SURVEYS:  {rehab surveys:24030}  COGNITION: Overall cognitive status: {cognition:24006}     SENSATION: {sensation:27233}  EDEMA:  {edema:24020}  MUSCLE LENGTH: Hamstrings: Right *** deg; Left *** deg Debby test: Right *** deg; Left *** deg  POSTURE: {posture:25561}  PALPATION: ***  LOWER EXTREMITY ROM:  {AROM/PROM:27142} ROM Right eval  Left eval  Hip flexion    Hip extension    Hip abduction    Hip adduction    Hip internal rotation    Hip external rotation    Knee flexion    Knee extension    Ankle dorsiflexion    Ankle plantarflexion    Ankle inversion    Ankle eversion     (Blank rows = not tested)  LOWER EXTREMITY MMT:  MMT Right eval Left eval  Hip flexion    Hip extension    Hip abduction    Hip adduction    Hip internal rotation    Hip external rotation    Knee flexion    Knee extension    Ankle dorsiflexion    Ankle plantarflexion     Ankle inversion    Ankle eversion     (Blank rows = not tested)  LOWER EXTREMITY SPECIAL TESTS:  {LEspecialtests:26242}  FUNCTIONAL TESTS:  {Functional tests:24029}  GAIT: Distance walked: *** Assistive device utilized: {Assistive devices:23999} Level of assistance: {Levels of assistance:24026} Comments: ***                                                                                                                                TREATMENT DATE: ***    PATIENT EDUCATION:  Education details: *** Person educated: {Person educated:25204} Education method: {Education Method:25205} Education comprehension: {Education Comprehension:25206}  HOME EXERCISE PROGRAM: ***  ASSESSMENT:  CLINICAL IMPRESSION: Patient is a *** y.o. *** who was seen today for physical therapy evaluation and treatment for ***.   OBJECTIVE IMPAIRMENTS: {opptimpairments:25111}.   ACTIVITY LIMITATIONS: {activitylimitations:27494}  PARTICIPATION LIMITATIONS: {participationrestrictions:25113}  PERSONAL FACTORS: {Personal factors:25162} are also affecting patient's functional outcome.   REHAB POTENTIAL: {rehabpotential:25112}  CLINICAL DECISION MAKING: {clinical decision making:25114}  EVALUATION COMPLEXITY: {Evaluation complexity:25115}   GOALS: Goals reviewed with patient? {yes/no:20286}  SHORT TERM GOALS: Target date: *** *** Baseline: Goal status: INITIAL  2.  *** Baseline:  Goal status: INITIAL  3.  *** Baseline:  Goal status: INITIAL  4.  *** Baseline:  Goal status: INITIAL  5.  *** Baseline:  Goal status: INITIAL  6.  *** Baseline:  Goal status: INITIAL  LONG TERM GOALS: Target date: ***  *** Baseline:  Goal status: INITIAL  2.  *** Baseline:  Goal status: INITIAL  3.  *** Baseline:  Goal status: INITIAL  4.  *** Baseline:  Goal status: INITIAL  5.  *** Baseline:  Goal status: INITIAL  6.  *** Baseline:  Goal status: INITIAL   PLAN:  PT  FREQUENCY: {rehab frequency:25116}  PT DURATION: {rehab duration:25117}  PLANNED INTERVENTIONS: {rehab planned interventions:25118::97110-Therapeutic exercises,97530- Therapeutic [phone]- Neuromuscular re-education,97535- Self Rjmz,02859- Manual therapy,Patient/Family education}  PLAN FOR NEXT SESSION: ***   Alvia Jablonski April Ma L Langley Flatley, PT 03/18/2024, 11:15 AM

## 2024-03-25 ENCOUNTER — Encounter: Payer: Self-pay | Admitting: Orthopedic Surgery

## 2024-03-25 ENCOUNTER — Ambulatory Visit: Admitting: Orthopedic Surgery

## 2024-03-25 DIAGNOSIS — M1712 Unilateral primary osteoarthritis, left knee: Secondary | ICD-10-CM

## 2024-03-25 NOTE — Progress Notes (Signed)
 Return patient Visit  Assessment: Rhonda Garcia is a 83 y.o. female with the following: 1. Arthritis of left knee  Plan: Rhonda Garcia has advanced arthritis in the left knee.  She has had a couple of injections of HA.  She is here for her final injection.  She is also interested in a brace.  She was fitted for a brace today.  She will follow-up as needed.    This patient is diagnosed with osteoarthritis of the knee(s).    Radiographs show evidence of joint space narrowing, osteophytes, subchondral sclerosis and/or subchondral cysts.  This patient has knee pain which interferes with functional and activities of daily living.    This patient has experienced inadequate response, adverse effects and/or intolerance with conservative treatments such as acetaminophen , NSAIDS, topical creams, physical therapy or regular exercise, knee bracing and/or weight loss.   This patient has experienced inadequate response or has a contraindication to intra articular steroid injections for at least 3 months.   This patient is not scheduled to have a total knee replacement within 6 months of starting treatment with viscosupplementation.  Procedure note injection Left knee joint   Verbal consent was obtained to inject the right knee joint  Timeout was completed to confirm the site of injection.  The skin was prepped with alcohol and ethyl chloride was sprayed at the injection site.  A 21-gauge needle was used to inject Orthovisc hyaluronic acid into the right knee using an anterolateral approach.  There were no complications. A sterile bandage was applied.   Follow-up: Return if symptoms worsen or fail to improve.  Subjective:  Chief Complaint  Patient presents with   Injections    L knee Ortho Visc #3    History of Present Illness: Rhonda Garcia is a 83 y.o. female who returns for evaluation of left knee pain.  I have seen her several times for pain in the left knee.  She has  completed 2 injections of hyaluronic acid.  Returns to clinic today for her final injection.  She is requesting a brace for her left knee.  Review of Systems: No fevers or chills No numbness or tingling No chest pain No shortness of breath No bowel or bladder dysfunction No GI distress No headaches    Objective: There were no vitals taken for this visit.  Physical Exam:  General: Elderly female., Alert and oriented., and No acute distress. Gait: Left sided antalgic gait.  Evaluation left knee demonstrates a moderate effusion.  Varus alignment overall.  She has a Baker's cyst and tenderness in the posterior aspect of the knee.  5-10 degrees short of full extension.  She tolerates flexion to 100 degrees.  No increased laxity varus or valgus stress.   IMAGING:  No new imaging obtained today.  New Medications:  No orders of the defined types were placed in this encounter.     Oneil DELENA Horde, MD  03/25/2024 10:50 AM

## 2024-03-30 ENCOUNTER — Ambulatory Visit: Payer: Self-pay

## 2024-03-30 NOTE — Telephone Encounter (Signed)
 Appt made.

## 2024-03-30 NOTE — Telephone Encounter (Signed)
 FYI Only or Action Required?: FYI only for provider: appointment scheduled on 12/23.  Patient was last seen in primary care on 01/23/2024 by Lavell Bari LABOR, FNP.  Called Nurse Triage reporting Foot Pain.  Symptoms began several days ago.  Interventions attempted: OTC medications: aspecreme, tylenol  and Prescription medications: voltaren .  Symptoms are: gradually worsening.  Triage Disposition: See PCP When Office is Open (Within 3 Days)  Patient/caregiver understands and will follow disposition?: Yes  Message from [place] sent at 03/30/2024  9:57 AM EST  Summary: [id number] Sabrie Souter foot pain swelling top left   Reason for Triage: pt foot is paining (lft foot) not sure what happened can't put a shoe on without having pain, a little swollen, top of foot         Reason for Disposition  [1] MODERATE pain (e.g., interferes with normal activities, limping) AND [2] present > 3 days  Answer Assessment - Initial Assessment Questions This weekend patient developed pain in the top of her left foot. It feels like something was dropped on it but does not recall doing so. Slightly swollen and tight. Hurts to wear socks and shoes. When walking has to hold foot stiff due to pain. 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 No new paresthesias. Denies fever, hot hard knot, temp changes to the foot, or loss of toe/foot movement.  Arthritis in her knees with injections   Aspercreme, arthritis cream, tylenol , voltaren   Appt with PCP tomorrow to assess. ED precautions reviewed.   1. ONSET: When did the pain start?      This weekend 2. LOCATION: Where is the pain located?      Top of left foot  3. PAIN: How bad is the pain?    (Scale 1-10; or mild, moderate, severe)     8/10 4. WORK OR EXERCISE: Has there been any recent work or exercise that involved this part of the body?      Denies  5. CAUSE: What do you think is causing the foot pain?     unsure 6. OTHER SYMPTOMS:  Do you have any other symptoms? (e.g., leg pain, rash, fever, numbness)     denies  Protocols used: Foot Pain-A-AH

## 2024-03-31 ENCOUNTER — Encounter: Payer: Self-pay | Admitting: Family

## 2024-03-31 ENCOUNTER — Ambulatory Visit: Admitting: Family

## 2024-03-31 ENCOUNTER — Ambulatory Visit

## 2024-03-31 VITALS — BP 149/77 | HR 86 | Temp 97.3°F | Ht 62.0 in | Wt 156.2 lb

## 2024-03-31 DIAGNOSIS — M79672 Pain in left foot: Secondary | ICD-10-CM

## 2024-03-31 NOTE — Patient Instructions (Signed)
 Foot Pain Many things can cause foot pain. Common causes include injuries to the foot. The injuries include sprains or broken bones, or injuries that affect the nerves in the feet. Other causes of foot pain include arthritis, blisters, and bunions. To know what causes your foot pain, your health care provider will take a detailed history of your symptoms. They will also do a physical exam as well as imaging tests, such as X-ray or MRI. Follow these instructions at home: Managing pain, stiffness, and swelling  If told, put ice on the painful area. Put ice in a plastic bag. Place a towel between your skin and the bag. Leave the ice on for 20 minutes, 2-3 times a day. If your skin turns bright red, remove the ice right away to prevent skin damage. The risk of damage is higher if you cannot feel pain, heat, or cold. Activity Do not stand or walk for long periods. Do stretches to relieve foot pain and stiffness as told by your provider. Do not lift anything that is heavier than 10 lb (4.5 kg), or the limit that you are told, until your provider says that it is safe. Lifting a lot of weight can put added pressure on your feet. Return to your normal activities as told by your provider. Ask your provider what activities are safe for you. Lifestyle Wear comfortable, supportive shoes that fit you well. Do not wear high heels. Keep your feet clean and dry. General instructions Take over-the-counter and prescription medicines only as told by your provider. Rub your foot gently. Pay attention to any changes in your symptoms. Let your provider know if symptoms become worse. Keep all follow-up visits. Your provider will want to monitor your progress. Contact a health care provider if: Your pain does not get better after a few days of treatment at home. Your pain gets worse. You cannot stand on your foot. Your foot or toes are swollen. Your foot is numb or tingling. Get help right away if: Your foot  or toes turn white or blue. You have warmth and redness along your foot. This information is not intended to replace advice given to you by your health care provider. Make sure you discuss any questions you have with your health care provider. Document Revised: 04/19/2022 Document Reviewed: 12/26/2021 Elsevier Patient Education  2024 [employer].

## 2024-03-31 NOTE — Progress Notes (Signed)
 "  Subjective:    Patient ID: Rhonda Garcia, female    DOB: [birth year], 83 y.o.   MRN: [id number]  Chief Complaint  Patient presents with   Foot Pain    Left foot pain no injury times 1 week   Pt presents to the office today with left dorsum  foot pain that started a week ago. Denies any injury.   Reports her pain is a 8 out 10 when wearing shoes or palpation.   Foot Pain This is a new problem. The current episode started 1 to 4 weeks ago. The problem occurs constantly. The problem has been unchanged. The symptoms are aggravated by walking and twisting. She has tried rest and acetaminophen  for the symptoms. The treatment provided mild relief.      Review of Systems  All other systems reviewed and are negative.   Social History   Socioeconomic History   Marital status: Married    Spouse name: Not on file   Number of children: 4   Years of education: 6   Highest education level: 6th grade  Occupational History   Occupation: Retired    Comment: Engineer, Site  Tobacco Use   Smoking status: Never   Smokeless tobacco: Never  Vaping Use   Vaping status: Never Used  Substance and Sexual Activity   Alcohol use: Not Currently    Alcohol/week: 1.0 standard drink of alcohol    Types: 1 Glasses of wine per week   Drug use: No   Sexual activity: Not Currently    Birth control/protection: Post-menopausal  Other Topics Concern   Not on file  Social History Narrative    Has 4 adult children. She lives home with one of her sons who recently had a stroke and needs 24 hour care. She is the primary caregiver and is now legally separated from her husband.    Social Drivers of Health   Tobacco Use: Low Risk (03/31/2024)   Patient History    Smoking Tobacco Use: Never    Smokeless Tobacco Use: Never    Passive Exposure: Not on file  Financial Resource Strain: Low Risk (10/15/2023)   Overall Financial Resource Strain (CARDIA)    Difficulty of Paying Living Expenses: Not hard at  all  Food Insecurity: No Food Insecurity (12/02/2023)   Epic    Worried About Radiation Protection Practitioner of Food in the Last Year: Never true    Ran Out of Food in the Last Year: Never true  Transportation Needs: No Transportation Needs (12/02/2023)   Epic    Lack of Transportation (Medical): No    Lack of Transportation (Non-Medical): No  Physical Activity: Insufficiently Active (10/15/2023)   Exercise Vital Sign    Days of Exercise per Week: 4 days    Minutes of Exercise per Session: 10 min  Stress: No Stress Concern Present (10/15/2023)   [name] of Occupational Health - Occupational Stress Questionnaire    Feeling of Stress: Not at all  Social Connections: Unknown (11/25/2023)   Social Connection and Isolation Panel    Frequency of Communication with Friends and Family: More than three times a week    Frequency of Social Gatherings with Friends and Family: Three times a week    Attends Religious Services: 1 to 4 times per year    Active Member of Clubs or Organizations: No    Attends Banker Meetings: 1 to 4 times per year    Marital Status: Patient declined  Recent Concern: Social Connections -  Moderately Isolated (10/15/2023)   Social Connection and Isolation Panel    Frequency of Communication with Friends and Family: More than three times a week    Frequency of Social Gatherings with Friends and Family: More than three times a week    Attends Religious Services: More than 4 times per year    Active Member of [employer] or Organizations: No    Attends Banker Meetings: Never    Marital Status: Widowed  Depression (PHQ2-9): Low Risk (01/23/2024)   Depression (PHQ2-9)    PHQ-2 Score: 0  Alcohol Screen: Low Risk (10/15/2023)   Alcohol Screen    Last Alcohol Screening Score (AUDIT): 0  Housing: Unknown (12/02/2023)   Epic    Unable to Pay for Housing in the Last Year: No    Number of Times Moved in the Last Year: Not on file    Homeless in the Last Year: No   Utilities: Not At Risk (12/02/2023)   Epic    Threatened with loss of utilities: No  Health Literacy: Adequate Health Literacy (10/15/2023)   B1300 Health Literacy    Frequency of need for help with medical instructions: Never   Family History  Problem Relation Age of Onset   Heart attack Mother    Hypertension Mother    Stroke Mother        Deceased, age 34   Diabetes Sister    Arthritis Sister    Diabetes Sister    Cancer Sister        ovarian   Stroke Sister    Alzheimer's disease Sister    Diabetes Brother        also has a blood disorder    Clotting disorder Brother    Kidney disease Brother    Heart disease Brother    Melanoma Brother        deceased   Diabetes Son    Hypertension Son    Coronary artery disease Son 2       CABG   Stroke Son    Hypertension Son    Coronary artery disease Son 77       CABG   Colon cancer Neg Hx    Breast cancer Neg Hx         Objective:   Physical Exam Vitals reviewed.  Constitutional:      General: She is not in acute distress.    Appearance: She is well-developed.  HENT:     Head: Normocephalic and atraumatic.  Eyes:     Pupils: Pupils are equal, round, and reactive to light.  Neck:     Thyroid : No thyromegaly.  Cardiovascular:     Rate and Rhythm: Normal rate and regular rhythm.     Heart sounds: Normal heart sounds. No murmur heard. Pulmonary:     Effort: Pulmonary effort is normal. No respiratory distress.     Breath sounds: Normal breath sounds. No wheezing.  Abdominal:     General: Bowel sounds are normal. There is no distension.     Palpations: Abdomen is soft.     Tenderness: There is no abdominal tenderness.  Musculoskeletal:        General: Tenderness present. Normal range of motion.     Cervical back: Normal range of motion and neck supple.       Feet:     Comments: Left dorsum pain, bruising, and tenderness  Skin:    General: Skin is warm and dry.  Neurological:     Mental  Status: She is alert  and oriented to person, place, and time.     Cranial Nerves: No cranial nerve deficit.     Deep Tendon Reflexes: Reflexes are normal and symmetric.  Psychiatric:        Behavior: Behavior normal.        Thought Content: Thought content normal.        Judgment: Judgment normal.       BP (!) 149/77   Pulse 86   Temp (!) 97.3 F (36.3 C) (Temporal)   Ht 5' 2 (1.575 m)   Wt 156 lb 3.2 oz (70.9 kg)   BMI 28.57 kg/m      Assessment & Plan:  Rhonda Garcia comes in today with chief complaint of Foot Pain (Left foot pain no injury times 1 week)   Diagnosis and orders addressed:  1. Left foot pain (Primary) Rest Ice Tylenol   Wear loose shoes Follow up if symptoms worsen or do not improve  - DG Foot Complete Left; Future       Bari Learn, FNP   "

## 2024-04-11 ENCOUNTER — Telehealth: Payer: Self-pay | Admitting: Gastroenterology

## 2024-04-11 NOTE — Telephone Encounter (Signed)
 Please let patient know that at her next office visit, need to discuss considering MRI abd/MRCP to follow up on tiny pancreatic lesions that were seen on CT in 11/2023. She is due for 3 month follow up at any time per Dr. Ivonne last ov note.

## 2024-04-13 NOTE — Telephone Encounter (Signed)
 Pt was made aware and verbalized understanding. Appt was scheduled.

## 2024-04-17 ENCOUNTER — Ambulatory Visit: Payer: Self-pay | Admitting: Family

## 2024-04-17 DIAGNOSIS — M79672 Pain in left foot: Secondary | ICD-10-CM

## 2024-04-17 NOTE — Telephone Encounter (Unsigned)
 Copied from CRM #[id number]. Topic: Clinical - Lab/Test Results >> Apr 17, 2024  3:27 PM Rhonda Garcia wrote: Reason for CRM: Pt is returning a missed call from [place] regarding results (DG Foot Complete Left). Please follow up with the patient at [phone]

## 2024-04-24 ENCOUNTER — Ambulatory Visit: Payer: Self-pay | Admitting: Family

## 2024-04-24 ENCOUNTER — Encounter: Payer: Self-pay | Admitting: Family

## 2024-04-24 VITALS — BP 134/87 | HR 89 | Temp 97.5°F | Ht 62.0 in | Wt 152.0 lb

## 2024-04-24 DIAGNOSIS — Z7984 Long term (current) use of oral hypoglycemic drugs: Secondary | ICD-10-CM

## 2024-04-24 DIAGNOSIS — E1159 Type 2 diabetes mellitus with other circulatory complications: Secondary | ICD-10-CM | POA: Diagnosis not present

## 2024-04-24 DIAGNOSIS — F411 Generalized anxiety disorder: Secondary | ICD-10-CM | POA: Diagnosis not present

## 2024-04-24 DIAGNOSIS — M0579 Rheumatoid arthritis with rheumatoid factor of multiple sites without organ or systems involvement: Secondary | ICD-10-CM | POA: Diagnosis not present

## 2024-04-24 DIAGNOSIS — F331 Major depressive disorder, recurrent, moderate: Secondary | ICD-10-CM | POA: Diagnosis not present

## 2024-04-24 DIAGNOSIS — K219 Gastro-esophageal reflux disease without esophagitis: Secondary | ICD-10-CM | POA: Diagnosis not present

## 2024-04-24 DIAGNOSIS — E039 Hypothyroidism, unspecified: Secondary | ICD-10-CM | POA: Diagnosis not present

## 2024-04-24 DIAGNOSIS — I152 Hypertension secondary to endocrine disorders: Secondary | ICD-10-CM | POA: Diagnosis not present

## 2024-04-24 DIAGNOSIS — F1320 Sedative, hypnotic or anxiolytic dependence, uncomplicated: Secondary | ICD-10-CM

## 2024-04-24 DIAGNOSIS — I480 Paroxysmal atrial fibrillation: Secondary | ICD-10-CM | POA: Diagnosis not present

## 2024-04-24 DIAGNOSIS — B9689 Other specified bacterial agents as the cause of diseases classified elsewhere: Secondary | ICD-10-CM

## 2024-04-24 DIAGNOSIS — I10 Essential (primary) hypertension: Secondary | ICD-10-CM | POA: Diagnosis not present

## 2024-04-24 DIAGNOSIS — E1169 Type 2 diabetes mellitus with other specified complication: Secondary | ICD-10-CM

## 2024-04-24 DIAGNOSIS — K5900 Constipation, unspecified: Secondary | ICD-10-CM

## 2024-04-24 LAB — BAYER DCA HB A1C WAIVED: HB A1C (BAYER DCA - WAIVED): 6.7 % — ABNORMAL HIGH (ref 4.8–5.6)

## 2024-04-24 MED ORDER — LEVOTHYROXINE SODIUM 50 MCG PO TABS: ORAL_TABLET | ORAL | 3 refills | Status: AC

## 2024-04-24 MED ORDER — PANTOPRAZOLE SODIUM 40 MG PO TBEC: 40.0000 mg | DELAYED_RELEASE_TABLET | Freq: Two times a day (BID) | ORAL | 1 refills | Status: AC

## 2024-04-24 MED ORDER — DOXYCYCLINE MONOHYDRATE 100 MG PO TABS: 100.0000 mg | ORAL_TABLET | Freq: Two times a day (BID) | ORAL | 0 refills | Status: AC

## 2024-04-24 MED ORDER — APIXABAN 2.5 MG PO TABS: 2.5000 mg | ORAL_TABLET | Freq: Two times a day (BID) | ORAL | 2 refills | Status: AC

## 2024-04-24 MED ORDER — ISOSORBIDE MONONITRATE ER 60 MG PO TB24: 60.0000 mg | ORAL_TABLET | Freq: Every day | ORAL | 0 refills | Status: AC

## 2024-04-24 MED ORDER — PRAVASTATIN SODIUM 80 MG PO TABS: 80.0000 mg | ORAL_TABLET | Freq: Every evening | ORAL | 1 refills | Status: AC

## 2024-04-24 MED ORDER — METOPROLOL TARTRATE 50 MG PO TABS: 50.0000 mg | ORAL_TABLET | Freq: Two times a day (BID) | ORAL | 1 refills | Status: AC

## 2024-04-24 MED ORDER — ALPRAZOLAM 0.5 MG PO TABS: 0.5000 mg | ORAL_TABLET | Freq: Every day | ORAL | 2 refills | Status: AC | PRN

## 2024-04-24 MED ORDER — FAMOTIDINE 40 MG PO TABS: 40.0000 mg | ORAL_TABLET | Freq: Every day | ORAL | 1 refills | Status: AC

## 2024-04-24 MED ORDER — RYBELSUS 7 MG PO TABS: 7.0000 mg | ORAL_TABLET | Freq: Every day | ORAL | 2 refills | Status: AC

## 2024-04-24 MED ORDER — LISINOPRIL 10 MG PO TABS: 10.0000 mg | ORAL_TABLET | Freq: Every day | ORAL | 1 refills | Status: AC

## 2024-04-24 NOTE — Progress Notes (Signed)
 "  Subjective:    Patient ID: Rhonda Garcia, female    DOB: March 28, 1941, 84 y.o.   MRN: [id number]  Chief Complaint  Patient presents with   Medical Management of Chronic Issues    LEFT FOOT STILL SORE SORE FROM THE SPUR GOING TO SEE DR. DRAKE IN [facility]. HANDS STILL SWOLLEN. THINKS SHE HAD THE FLU 2 WEEKS AGO AND STILL HAS A COUGH.   PT presents to the office today for chronic follow up.   She is followed by Cardiologists every other year for A Fib, hx NSTEMI, and CAD. Takes Eliquis .    She is followed by GI as needed for constipation, GERD.   She had elevated rheumatoid arthritis and saw a rheumatologists. She states her pain is a 6 out 10.   She is followed by Ortho for left knee pain and getting gel injection.    Her husband passed away 05-26-2023. Since grieving this.   She had flu like symptoms two weeks ago, but still having facial tenderness and cough.  Hypertension This is a chronic problem. The current episode started more than 1 year ago. The problem has been resolved since onset. The problem is uncontrolled. Associated symptoms include anxiety, headaches, malaise/fatigue (some times) and peripheral edema. Pertinent negatives include no blurred vision or shortness of breath (when walking distances). Risk factors for coronary artery disease include dyslipidemia, diabetes mellitus, obesity, sedentary lifestyle and post-menopausal state. The current treatment provides moderate improvement. Identifiable causes of hypertension include a thyroid  problem.  Gastroesophageal Reflux She complains of belching, coughing and heartburn. She reports no hoarse voice or no wheezing. This is a chronic problem. The current episode started more than 1 year ago. The problem occurs occasionally. The symptoms are aggravated by certain foods. Associated symptoms include fatigue. She has tried a PPI for the symptoms. The treatment provided moderate relief.  Diabetes She presents for her follow-up  diabetic visit. She has type 2 diabetes mellitus. Hypoglycemia symptoms include headaches and nervousness/anxiousness. Associated symptoms include fatigue and foot paresthesias. Pertinent negatives for diabetes include no blurred vision. Symptoms are stable. Diabetic complications include peripheral neuropathy. Risk factors for coronary artery disease include dyslipidemia, diabetes mellitus, hypertension, sedentary lifestyle and post-menopausal. She is following a generally healthy diet. Her overall blood glucose range is 180-200 mg/dl. (Does not check glucose regularly )  Thyroid  Problem Presents for follow-up visit. Symptoms include anxiety, constipation, depressed mood and fatigue. Patient reports no diarrhea or hoarse voice. The symptoms have been stable.  Hyperlipidemia This is a chronic problem. The current episode started more than 1 year ago. The problem is controlled. Recent lipid tests were reviewed and are normal. Exacerbating diseases include obesity. Associated symptoms include myalgias. Pertinent negatives include no shortness of breath (when walking distances). Current antihyperlipidemic treatment includes statins. The current treatment provides moderate improvement of lipids. Risk factors for coronary artery disease include dyslipidemia, diabetes mellitus, hypertension, a sedentary lifestyle, post-menopausal and obesity.  Arthritis Presents for follow-up visit. She complains of pain and stiffness. The symptoms have been stable. Affected locations include the left knee, right knee and right elbow. Her pain is at a severity of 6/10. Associated symptoms include fatigue. Pertinent negatives include no diarrhea or fever.  Anxiety Presents for follow-up visit. Symptoms include depressed mood, excessive worry, insomnia, irritability, nervous/anxious behavior and restlessness. Patient reports no shortness of breath (when walking distances). Symptoms occur most days. The severity of symptoms is  moderate.    Depression        This  is a chronic problem.  The current episode started more than 1 year ago.   The problem occurs intermittently.  Associated symptoms include fatigue, insomnia, restlessness, myalgias, headaches and sad.  Associated symptoms include no helplessness and no hopelessness.  Past medical history includes thyroid  problem and anxiety.   Constipation This is a chronic problem. The current episode started more than 1 year ago. The problem has been resolved since onset. Her stool frequency is 2 to 3 times per week. Pertinent negatives include no diarrhea or fever. She has tried diet changes, stool softeners, fiber and laxatives (linzess ) for the symptoms. The treatment provided moderate relief.  Cough This is a new problem. The current episode started 1 to 4 weeks ago. The problem has been gradually worsening. The problem occurs every few minutes. Associated symptoms include headaches, heartburn, myalgias, nasal congestion and rhinorrhea. Pertinent negatives include no chills, ear congestion, ear pain, fever, shortness of breath (when walking distances) or wheezing. She has tried rest and OTC cough suppressant for the symptoms. The treatment provided mild relief.    Current opioids rx- Ultram  50 mg # meds rx- 20 Effectiveness of current meds-stable Adverse reactions from pain meds-constipation Morphine  equivalent- 15  Pill count performed-No Last drug screen - 10/18/23 ( high risk q48m, moderate risk q14m, low risk yearly ) Urine drug screen today- Yes Was the [facility] reviewed- yes  If yes were their any concerning findings? - none Pain contract signed on: 10/18/23    Review of Systems  Constitutional:  Positive for fatigue, irritability and malaise/fatigue (some times). Negative for chills and fever.  HENT:  Positive for rhinorrhea. Negative for ear pain and hoarse voice.   Eyes:  Negative for blurred vision.  Respiratory:  Positive for cough. Negative for shortness  of breath (when walking distances) and wheezing.   Gastrointestinal:  Positive for constipation and heartburn. Negative for diarrhea.  Musculoskeletal:  Positive for myalgias and stiffness.  Neurological:  Positive for headaches.  Psychiatric/Behavioral:  The patient is nervous/anxious and has insomnia.   All other systems reviewed and are negative.  Family History  Problem Relation Age of Onset   Heart attack Mother    Hypertension Mother    Stroke Mother        Deceased, age 9   Diabetes Sister    Arthritis Sister    Diabetes Sister    Cancer Sister        ovarian   Stroke Sister    Alzheimer's disease Sister    Diabetes Brother        also has a blood disorder    Clotting disorder Brother    Kidney disease Brother    Heart disease Brother    Melanoma Brother        deceased   Diabetes Son    Hypertension Son    Coronary artery disease Son 41       CABG   Stroke Son    Hypertension Son    Coronary artery disease Son 35       CABG   Colon cancer Neg Hx    Breast cancer Neg Hx    Social History   Socioeconomic History   Marital status: Married    Spouse name: Not on file   Number of children: 4   Years of education: 6   Highest education level: 6th grade  Occupational History   Occupation: Retired    Comment: Engineer, Site  Tobacco Use   Smoking status: Never  Smokeless tobacco: Never  Vaping Use   Vaping status: Never Used  Substance and Sexual Activity   Alcohol use: Not Currently    Alcohol/week: 1.0 standard drink of alcohol    Types: 1 Glasses of wine per week   Drug use: No   Sexual activity: Not Currently    Birth control/protection: Post-menopausal  Other Topics Concern   Not on file  Social History Narrative    Has 4 adult children. She lives home with one of her sons who recently had a stroke and needs 24 hour care. She is the primary caregiver and is now legally separated from her husband.    Social Drivers of Health   Tobacco Use: Low  Risk (04/24/2024)   Patient History    Smoking Tobacco Use: Never    Smokeless Tobacco Use: Never    Passive Exposure: Not on file  Financial Resource Strain: Low Risk (10/15/2023)   Overall Financial Resource Strain (CARDIA)    Difficulty of Paying Living Expenses: Not hard at all  Food Insecurity: No Food Insecurity (12/02/2023)   Epic    Worried About Radiation Protection Practitioner of Food in the Last Year: Never true    Ran Out of Food in the Last Year: Never true  Transportation Needs: No Transportation Needs (12/02/2023)   Epic    Lack of Transportation (Medical): No    Lack of Transportation (Non-Medical): No  Physical Activity: Insufficiently Active (10/15/2023)   Exercise Vital Sign    Days of Exercise per Week: 4 days    Minutes of Exercise per Session: 10 min  Stress: No Stress Concern Present (10/15/2023)   [name] of Occupational Health - Occupational Stress Questionnaire    Feeling of Stress: Not at all  Social Connections: Unknown (11/25/2023)   Social Connection and Isolation Panel    Frequency of Communication with Friends and Family: More than three times a week    Frequency of Social Gatherings with Friends and Family: Three times a week    Attends Religious Services: 1 to 4 times per year    Active Member of Clubs or Organizations: No    Attends Banker Meetings: 1 to 4 times per year    Marital Status: Patient declined  Recent Concern: Social Connections - Moderately Isolated (10/15/2023)   Social Connection and Isolation Panel    Frequency of Communication with Friends and Family: More than three times a week    Frequency of Social Gatherings with Friends and Family: More than three times a week    Attends Religious Services: More than 4 times per year    Active Member of [employer] or Organizations: No    Attends Banker Meetings: Never    Marital Status: Widowed  Depression (PHQ2-9): Low Risk (04/24/2024)   Depression (PHQ2-9)    PHQ-2 Score: 2   Alcohol Screen: Low Risk (10/15/2023)   Alcohol Screen    Last Alcohol Screening Score (AUDIT): 0  Housing: Unknown (12/02/2023)   Epic    Unable to Pay for Housing in the Last Year: No    Number of Times Moved in the Last Year: Not on file    Homeless in the Last Year: No  Utilities: Not At Risk (12/02/2023)   Epic    Threatened with loss of utilities: No  Health Literacy: Adequate Health Literacy (10/15/2023)   B1300 Health Literacy    Frequency of need for help with medical instructions: Never       Objective:  Physical Exam Vitals reviewed.  Constitutional:      General: She is not in acute distress.    Appearance: She is well-developed. She is obese.  HENT:     Head: Normocephalic and atraumatic.     Right Ear: Tympanic membrane normal.     Left Ear: Tympanic membrane normal.  Eyes:     Pupils: Pupils are equal, round, and reactive to light.  Neck:     Thyroid : No thyromegaly.  Cardiovascular:     Rate and Rhythm: Normal rate and regular rhythm.     Heart sounds: Normal heart sounds. No murmur heard. Pulmonary:     Effort: Pulmonary effort is normal. No respiratory distress.     Breath sounds: Normal breath sounds. No wheezing.     Comments: Dry nonproductive cough Abdominal:     General: Bowel sounds are normal. There is no distension.     Palpations: Abdomen is soft.     Tenderness: There is no abdominal tenderness.  Musculoskeletal:        General: Tenderness present.     Cervical back: Normal range of motion and neck supple.     Comments: Pain in left knee flexion and extension   Skin:    General: Skin is warm and dry.  Neurological:     Mental Status: She is alert and oriented to person, place, and time.     Cranial Nerves: No cranial nerve deficit.     Deep Tendon Reflexes: Reflexes are normal and symmetric.  Psychiatric:        Behavior: Behavior normal.        Thought Content: Thought content normal.        Judgment: Judgment normal.      BP  134/87   Pulse 89   Temp (!) 97.5 F (36.4 C) (Temporal)   Ht 5' 2 (1.575 m)   Wt 152 lb (68.9 kg)   BMI 27.80 kg/m      Assessment & Plan:  Rhonda Garcia comes in today with chief complaint of Medical Management of Chronic Issues (LEFT FOOT STILL SORE SORE FROM THE SPUR GOING TO SEE DR. DRAKE IN [facility]. HANDS STILL SWOLLEN. THINKS SHE HAD THE FLU 2 WEEKS AGO AND STILL HAS A COUGH.)   Diagnosis and orders addressed: 1. GAD (generalized anxiety disorder) - ALPRAZolam  (XANAX ) 0.5 MG tablet; Take 1 tablet (0.5 mg total) by mouth daily as needed.  Dispense: 20 tablet; Refill: 2 - CBC with Differential/Platelet - CMP14+EGFR  2. Paroxysmal atrial fibrillation (HCC) - apixaban  (ELIQUIS ) 2.5 MG TABS tablet; Take 1 tablet (2.5 mg total) by mouth 2 (two) times daily.  Dispense: 180 tablet; Refill: 2 - CBC with Differential/Platelet - CMP14+EGFR  3. Gastroesophageal reflux disease without esophagitis - famotidine  (PEPCID ) 40 MG tablet; Take 1 tablet (40 mg total) by mouth daily.  Dispense: 90 tablet; Refill: 1 - pantoprazole  (PROTONIX ) 40 MG tablet; Take 1 tablet (40 mg total) by mouth 2 (two) times daily.  Dispense: 180 tablet; Refill: 1 - CBC with Differential/Platelet - CMP14+EGFR  4. Hypothyroidism, unspecified type - levothyroxine  (SYNTHROID ) 50 MCG tablet; TAKE ONE (1) TABLET EACH DAY  Dispense: 90 tablet; Refill: 3 - CBC with Differential/Platelet - CMP14+EGFR - TSH  5. Essential hypertension - lisinopril  (ZESTRIL ) 10 MG tablet; Take 1 tablet (10 mg total) by mouth daily.  Dispense: 90 tablet; Refill: 1 - CBC with Differential/Platelet - CMP14+EGFR  6. Hypertension associated with diabetes (HCC) - metoprolol  tartrate (LOPRESSOR ) 50 MG tablet; Take  1 tablet (50 mg total) by mouth 2 (two) times daily.  Dispense: 180 tablet; Refill: 1 - CBC with Differential/Platelet - CMP14+EGFR  7. Hyperlipidemia associated with type 2 diabetes mellitus (HCC) - pravastatin  (PRAVACHOL )  80 MG tablet; Take 1 tablet (80 mg total) by mouth every evening.  Dispense: 90 tablet; Refill: 1 - CBC with Differential/Platelet - Lipid panel - CMP14+EGFR  8. Type 2 diabetes mellitus with other specified complication, without long-term current use of insulin  (HCC) (Primary) - Semaglutide  (RYBELSUS ) 7 MG TABS; Take 1 tablet (7 mg total) by mouth daily.  Dispense: 90 tablet; Refill: 2 - Bayer DCA Hb A1c Waived - CBC with Differential/Platelet - CMP14+EGFR  9. Benzodiazepine dependence (HCC) - CBC with Differential/Platelet - CMP14+EGFR  10. Moderate episode of recurrent major depressive disorder (HCC) - CBC with Differential/Platelet - CMP14+EGFR  11. Rheumatoid arthritis with rheumatoid factor of multiple sites without organ or systems involvement (HCC) - CBC with Differential/Platelet - CMP14+EGFR  12. Constipation, unspecified constipation type - CBC with Differential/Platelet - CMP14+EGFR  13. Acute bacterial bronchitis - Take meds as prescribed - Use a cool mist humidifier  -Use saline nose sprays frequently -Force fluids -For any cough or congestion  Use plain Mucinex- regular strength or max strength is fine -For fever or aces or pains- take tylenol  or ibuprofen. -Throat lozenges if help -Follow up if symptoms worsen or do not improve  - CBC with Differential/Platelet - CMP14+EGFR - doxycycline  (ADOXA) 100 MG tablet; Take 1 tablet (100 mg total) by mouth 2 (two) times daily.  Dispense: 20 tablet; Refill: 0    Continue Linzess  290 mcg daily and Miralax   Force fluids Start doxycycline   Patient reviewed in [place] controlled database, no flags noted. Contract and drug screen are up to date.  Continue current medications  Health Maintenance reviewed Diet and exercise encouraged  Follow up plan: 3 months  Bari Learn, FNP   "

## 2024-04-24 NOTE — Patient Instructions (Signed)

## 2024-04-24 NOTE — Addendum Note (Signed)
 Addended by: MICHELINE KNEE F on: 04/24/2024 11:03 AM   Modules accepted: Orders

## 2024-04-25 LAB — CMP14+EGFR
ALT: 17 IU/L (ref 0–32)
AST: 23 IU/L (ref 0–40)
Albumin: 4.2 g/dL (ref 3.7–4.7)
Alkaline Phosphatase: 92 IU/L (ref 48–129)
BUN/Creatinine Ratio: 13 (ref 12–28)
BUN: 12 mg/dL (ref 8–27)
Bilirubin Total: 0.7 mg/dL (ref 0.0–1.2)
CO2: 24 mmol/L (ref 20–29)
Calcium: 9.4 mg/dL (ref 8.7–10.3)
Chloride: 104 mmol/L (ref 96–106)
Creatinine, Ser: 0.91 mg/dL (ref 0.57–1.00)
Globulin, Total: 2.2 g/dL (ref 1.5–4.5)
Glucose: 156 mg/dL — ABNORMAL HIGH (ref 70–99)
Potassium: 4.9 mmol/L (ref 3.5–5.2)
Sodium: 141 mmol/L (ref 134–144)
Total Protein: 6.4 g/dL (ref 6.0–8.5)
eGFR: 63 mL/min/1.73

## 2024-04-25 LAB — CBC WITH DIFFERENTIAL/PLATELET
Basophils Absolute: 0.1 x10E3/uL (ref 0.0–0.2)
Basos: 1 %
EOS (ABSOLUTE): 0.1 x10E3/uL (ref 0.0–0.4)
Eos: 2 %
Hematocrit: 37.9 % (ref 34.0–46.6)
Hemoglobin: 12.1 g/dL (ref 11.1–15.9)
Immature Grans (Abs): 0 x10E3/uL (ref 0.0–0.1)
Immature Granulocytes: 0 %
Lymphocytes Absolute: 1.1 x10E3/uL (ref 0.7–3.1)
Lymphs: 22 %
MCH: 30.9 pg (ref 26.6–33.0)
MCHC: 31.9 g/dL (ref 31.5–35.7)
MCV: 97 fL (ref 79–97)
Monocytes Absolute: 0.4 x10E3/uL (ref 0.1–0.9)
Monocytes: 9 %
Neutrophils Absolute: 3.3 x10E3/uL (ref 1.4–7.0)
Neutrophils: 66 %
Platelets: 138 x10E3/uL — ABNORMAL LOW (ref 150–450)
RBC: 3.92 x10E6/uL (ref 3.77–5.28)
RDW: 13.6 % (ref 11.7–15.4)
WBC: 5 x10E3/uL (ref 3.4–10.8)

## 2024-04-25 LAB — LIPID PANEL
Chol/HDL Ratio: 3.3 ratio (ref 0.0–4.4)
Cholesterol, Total: 143 mg/dL (ref 100–199)
HDL: 43 mg/dL
LDL Chol Calc (NIH): 82 mg/dL (ref 0–99)
Triglycerides: 95 mg/dL (ref 0–149)
VLDL Cholesterol Cal: 18 mg/dL (ref 5–40)

## 2024-04-25 LAB — TSH: TSH: 2.51 u[IU]/mL (ref 0.450–4.500)

## 2024-04-27 ENCOUNTER — Ambulatory Visit: Payer: Self-pay | Admitting: Family

## 2024-05-04 ENCOUNTER — Ambulatory Visit: Admitting: Gastroenterology

## 2024-06-17 ENCOUNTER — Ambulatory Visit: Admitting: Gastroenterology

## 2024-07-23 ENCOUNTER — Ambulatory Visit: Admitting: Family

## 2024-10-15 ENCOUNTER — Ambulatory Visit: Payer: Self-pay

## 2024-10-16 ENCOUNTER — Ambulatory Visit
